# Patient Record
Sex: Female | Born: 1980
Health system: Southern US, Community
[De-identification: ages and names within clinical notes are randomized; demographics above are authoritative.]

## PROBLEM LIST (undated history)

## (undated) ENCOUNTER — Emergency Department (HOSPITAL_COMMUNITY): Admission: EM | Payer: Self-pay

## (undated) DIAGNOSIS — IMO0002 Reserved for concepts with insufficient information to code with codable children: Secondary | ICD-10-CM

## (undated) DIAGNOSIS — R112 Nausea with vomiting, unspecified: Secondary | ICD-10-CM

## (undated) DIAGNOSIS — E785 Hyperlipidemia, unspecified: Secondary | ICD-10-CM

## (undated) DIAGNOSIS — K219 Gastro-esophageal reflux disease without esophagitis: Secondary | ICD-10-CM

## (undated) DIAGNOSIS — U071 COVID-19: Secondary | ICD-10-CM

## (undated) DIAGNOSIS — R519 Headache, unspecified: Secondary | ICD-10-CM

## (undated) DIAGNOSIS — Z9889 Other specified postprocedural states: Secondary | ICD-10-CM

## (undated) DIAGNOSIS — F909 Attention-deficit hyperactivity disorder, unspecified type: Secondary | ICD-10-CM

## (undated) DIAGNOSIS — I10 Essential (primary) hypertension: Secondary | ICD-10-CM

## (undated) DIAGNOSIS — G8929 Other chronic pain: Secondary | ICD-10-CM

## (undated) DIAGNOSIS — I959 Hypotension, unspecified: Secondary | ICD-10-CM

## (undated) DIAGNOSIS — E038 Other specified hypothyroidism: Secondary | ICD-10-CM

## (undated) DIAGNOSIS — I2699 Other pulmonary embolism without acute cor pulmonale: Secondary | ICD-10-CM

## (undated) DIAGNOSIS — E041 Nontoxic single thyroid nodule: Secondary | ICD-10-CM

## (undated) DIAGNOSIS — F32A Depression, unspecified: Secondary | ICD-10-CM

## (undated) DIAGNOSIS — R51 Headache: Secondary | ICD-10-CM

## (undated) DIAGNOSIS — M1990 Unspecified osteoarthritis, unspecified site: Secondary | ICD-10-CM

## (undated) DIAGNOSIS — D6852 Prothrombin gene mutation: Secondary | ICD-10-CM

## (undated) DIAGNOSIS — N809 Endometriosis, unspecified: Secondary | ICD-10-CM

## (undated) DIAGNOSIS — M329 Systemic lupus erythematosus, unspecified: Secondary | ICD-10-CM

## (undated) DIAGNOSIS — F419 Anxiety disorder, unspecified: Secondary | ICD-10-CM

## (undated) HISTORY — DX: Hyperlipidemia, unspecified: E78.5

## (undated) HISTORY — PX: OTHER SURGICAL HISTORY: SHX169

## (undated) HISTORY — DX: Other specified hypothyroidism: E03.8

## (undated) HISTORY — DX: Headache: R51

## (undated) HISTORY — DX: Headache, unspecified: R51.9

## (undated) HISTORY — DX: Endometriosis, unspecified: N80.9

## (undated) HISTORY — DX: Depression, unspecified: F32.A

## (undated) HISTORY — DX: Reserved for concepts with insufficient information to code with codable children: IMO0002

## (undated) HISTORY — DX: Systemic lupus erythematosus, unspecified: M32.9

## (undated) HISTORY — DX: Prothrombin gene mutation: D68.52

## (undated) HISTORY — DX: Nontoxic single thyroid nodule: E04.1

## (undated) HISTORY — DX: Other chronic pain: G89.29

## (undated) HISTORY — DX: Unspecified osteoarthritis, unspecified site: M19.90

---
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
## 1999-01-18 ENCOUNTER — Inpatient Hospital Stay (HOSPITAL_COMMUNITY): Admission: AD | Admit: 1999-01-18 | Discharge: 1999-01-18 | Payer: Self-pay | Admitting: Obstetrics

## 1999-01-19 ENCOUNTER — Inpatient Hospital Stay (HOSPITAL_COMMUNITY): Admission: RE | Admit: 1999-01-19 | Discharge: 1999-01-19 | Payer: Self-pay | Admitting: Obstetrics

## 1999-01-30 ENCOUNTER — Emergency Department (HOSPITAL_COMMUNITY): Admission: EM | Admit: 1999-01-30 | Discharge: 1999-01-30 | Payer: Self-pay | Admitting: Emergency Medicine

## 1999-01-30 ENCOUNTER — Encounter: Payer: Self-pay | Admitting: Emergency Medicine

## 2002-06-07 ENCOUNTER — Inpatient Hospital Stay (HOSPITAL_COMMUNITY): Admission: AD | Admit: 2002-06-07 | Discharge: 2002-06-07 | Payer: Self-pay | Admitting: Family Medicine

## 2003-04-20 HISTORY — PX: DIAGNOSTIC LAPAROSCOPY: SUR761

## 2003-09-06 ENCOUNTER — Ambulatory Visit (HOSPITAL_COMMUNITY): Admission: RE | Admit: 2003-09-06 | Discharge: 2003-09-06 | Payer: Self-pay | Admitting: Gastroenterology

## 2003-09-09 ENCOUNTER — Ambulatory Visit (HOSPITAL_COMMUNITY): Admission: RE | Admit: 2003-09-09 | Discharge: 2003-09-09 | Payer: Self-pay | Admitting: Gastroenterology

## 2003-10-17 ENCOUNTER — Other Ambulatory Visit: Admission: RE | Admit: 2003-10-17 | Discharge: 2003-10-17 | Payer: Self-pay | Admitting: Family Medicine

## 2003-12-11 ENCOUNTER — Ambulatory Visit (HOSPITAL_COMMUNITY): Admission: RE | Admit: 2003-12-11 | Discharge: 2003-12-11 | Payer: Self-pay | Admitting: Family Medicine

## 2004-01-18 ENCOUNTER — Emergency Department (HOSPITAL_COMMUNITY): Admission: EM | Admit: 2004-01-18 | Discharge: 2004-01-18 | Payer: Self-pay | Admitting: Emergency Medicine

## 2004-01-21 ENCOUNTER — Encounter: Admission: RE | Admit: 2004-01-21 | Discharge: 2004-01-21 | Payer: Self-pay | Admitting: *Deleted

## 2004-04-08 ENCOUNTER — Ambulatory Visit (HOSPITAL_COMMUNITY): Admission: RE | Admit: 2004-04-08 | Discharge: 2004-04-08 | Payer: Self-pay | Admitting: Obstetrics and Gynecology

## 2004-08-03 ENCOUNTER — Other Ambulatory Visit: Admission: RE | Admit: 2004-08-03 | Discharge: 2004-08-03 | Payer: Self-pay | Admitting: Obstetrics and Gynecology

## 2004-09-17 ENCOUNTER — Ambulatory Visit (HOSPITAL_COMMUNITY): Admission: RE | Admit: 2004-09-17 | Discharge: 2004-09-17 | Payer: Self-pay | Admitting: Obstetrics and Gynecology

## 2005-04-19 HISTORY — PX: CYSTOSCOPY: SUR368

## 2005-04-19 HISTORY — PX: ABDOMINAL HYSTERECTOMY: SHX81

## 2005-08-03 ENCOUNTER — Ambulatory Visit (HOSPITAL_BASED_OUTPATIENT_CLINIC_OR_DEPARTMENT_OTHER): Admission: RE | Admit: 2005-08-03 | Discharge: 2005-08-03 | Payer: Self-pay | Admitting: Urology

## 2005-09-27 ENCOUNTER — Ambulatory Visit (HOSPITAL_COMMUNITY): Admission: RE | Admit: 2005-09-27 | Discharge: 2005-09-28 | Payer: Self-pay | Admitting: Obstetrics and Gynecology

## 2008-04-19 HISTORY — PX: ROTATOR CUFF REPAIR: SHX139

## 2010-05-09 ENCOUNTER — Encounter: Payer: Self-pay | Admitting: Neurological Surgery

## 2010-05-09 ENCOUNTER — Encounter: Payer: Self-pay | Admitting: Family Medicine

## 2011-04-02 ENCOUNTER — Encounter (HOSPITAL_COMMUNITY): Payer: Self-pay | Admitting: Pharmacist

## 2011-04-05 ENCOUNTER — Encounter (HOSPITAL_COMMUNITY)
Admission: RE | Admit: 2011-04-05 | Discharge: 2011-04-05 | Disposition: A | Discharge status: Home / Self Care | Payer: PRIVATE HEALTH INSURANCE | Source: Ambulatory Visit | Attending: Obstetrics and Gynecology | Admitting: Obstetrics and Gynecology

## 2011-04-05 ENCOUNTER — Encounter (HOSPITAL_COMMUNITY): Payer: Self-pay

## 2011-04-05 HISTORY — DX: Hypotension, unspecified: I95.9

## 2011-04-05 HISTORY — DX: Anxiety disorder, unspecified: F41.9

## 2011-04-05 HISTORY — DX: Gastro-esophageal reflux disease without esophagitis: K21.9

## 2011-04-05 LAB — SURGICAL PCR SCREEN
MRSA, PCR: NEGATIVE
Staphylococcus aureus: NEGATIVE

## 2011-04-05 LAB — CBC
HCT: 37 % (ref 36.0–46.0)
Hemoglobin: 11.9 g/dL — ABNORMAL LOW (ref 12.0–15.0)
MCH: 28.5 pg (ref 26.0–34.0)
MCHC: 32.2 g/dL (ref 30.0–36.0)
MCV: 88.7 fL (ref 78.0–100.0)
Platelets: 222 10*3/uL (ref 150–400)
RBC: 4.17 MIL/uL (ref 3.87–5.11)
RDW: 13.2 % (ref 11.5–15.5)
WBC: 5.7 10*3/uL (ref 4.0–10.5)

## 2011-04-05 LAB — BASIC METABOLIC PANEL
BUN: 11 mg/dL (ref 6–23)
CO2: 25 mEq/L (ref 19–32)
Calcium: 9.2 mg/dL (ref 8.4–10.5)
Chloride: 104 mEq/L (ref 96–112)
Creatinine, Ser: 0.69 mg/dL (ref 0.50–1.10)
GFR calc Af Amer: >90 mL/min (ref >90)
GFR calc non Af Amer: >90 mL/min (ref >90)
Glucose, Bld: 70 mg/dL (ref 70–99)
Potassium: 3.7 mEq/L (ref 3.5–5.1)
Sodium: 138 mEq/L (ref 135–145)

## 2011-04-05 NOTE — Patient Instructions (Signed)
   Your procedure is scheduled on: Friday December 28th  Enter through the Main Entrance of [facility] at: [employer] up the phone at the desk and dial [phone] and inform us of your arrival.  Please call this number if you have any problems the morning of surgery: [phone]  Remember: Do not eat food after midnight:Thursday Do not drink clear liquids after:midnight thursday Take these medicines the morning of surgery with a SIP OF WATER:none  Do not wear jewelry, make-up, or FINGER nail polish Do not wear lotions, powders, perfumes or deodorant. Do not shave 48 hours prior to surgery. Do not bring valuables to the hospital.  Leave suitcase in the car. After Surgery it may be brought to your room. For patients being admitted to the hospital, checkout time is 11:00am the day of discharge.  Patients discharged on the day of surgery will not be allowed to drive home.     Remember to use your hibiclens as instructed.Please shower with 1/2 bottle the evening before your surgery and the other 1/2 bottle the morning of surgery.

## 2011-04-08 ENCOUNTER — Other Ambulatory Visit: Payer: Self-pay | Admitting: Obstetrics and Gynecology

## 2011-04-15 NOTE — H&P (Signed)
Kim Pham, Kim Pham              ACCOUNT NO.:  [id number]  MEDICAL RECORD NO.:  [id number]  LOCATION:                                 FACILITY:  PHYSICIAN:  Lenoard Aden, M.D.DATE OF BIRTH:  07-Feb-1981  DATE OF ADMISSION: DATE OF DISCHARGE:                             HISTORY & PHYSICAL   CHIEF COMPLAINT:  Right lower quadrant pain.  HISTORY OF PRESENT ILLNESS:  She is a 30 year old white female, G3, P1, status post LAVH in 2007 with a history of endometriosis now with right lower quadrant pain.  She has a strong family history of ovarian cancer, with known ovarian cyst, and now proceeded for definitive therapy.  She has __________, history of 1 vaginal delivery, two spontaneous abortions, history of laparoscopic surgery for endometriosis and an LAVH in 2007.  FAMILY HISTORY:  Noted mother and maternal grandmother with ovarian cancer, heart disease, and chronic hypertension.  PHYSICAL EXAMINATION:  GENERAL:  She is a well-developed, well- nourished, white female.  Weight of 173 pounds, 68 inches tall, blood pressure 116/70. HEENT:  Normal. NECK:  Supple.  Full range of motion. ABDOMEN:  Soft, nontender.  PELVIC:  A well supported vagina, right adnexal tenderness, and fullness. EXTREMITIES:  No cords. NEUROLOGICAL:  Nonfocal. SKIN:  Intact.  IMPRESSION: 1. Symptomatic right ovarian cyst with persistent chronic pelvic pain. 2. Strong family history of ovarian cancer. 3. Status post LAVH.  PLAN:  Engineer, building services assisted right salpingo-oophorectomy, possible lysis of adhesions, possible ablation of endometriosis, a left salpingectomy risks of anesthesia, infection, bleeding, injury to abdominal organs, need for repair was discussed, delayed versus immediate complications include bowel, bladder injury noted, inability to cure all of pelvic pain discussed with the patient, acknowledges, will proceed.     Lenoard Aden, M.D.    RJT/MEDQ  D:  04/15/2011  T:   04/15/2011  Job:  [id number]

## 2011-04-16 ENCOUNTER — Encounter (HOSPITAL_COMMUNITY): Payer: Self-pay | Admitting: Anesthesiology

## 2011-04-16 ENCOUNTER — Ambulatory Visit (HOSPITAL_COMMUNITY): Payer: PRIVATE HEALTH INSURANCE | Admitting: Anesthesiology

## 2011-04-16 ENCOUNTER — Other Ambulatory Visit: Payer: Self-pay | Admitting: Obstetrics and Gynecology

## 2011-04-16 ENCOUNTER — Encounter (HOSPITAL_COMMUNITY)
Admission: RE | Disposition: A | Discharge status: Home / Self Care | Payer: Self-pay | Source: Ambulatory Visit | Attending: Obstetrics and Gynecology

## 2011-04-16 ENCOUNTER — Ambulatory Visit (HOSPITAL_COMMUNITY)
Admission: RE | Admit: 2011-04-16 | Discharge: 2011-04-16 | Disposition: A | Discharge status: Home / Self Care | Payer: PRIVATE HEALTH INSURANCE | Source: Ambulatory Visit | Attending: Obstetrics and Gynecology | Admitting: Obstetrics and Gynecology

## 2011-04-16 ENCOUNTER — Encounter (HOSPITAL_COMMUNITY): Payer: Self-pay | Admitting: *Deleted

## 2011-04-16 DIAGNOSIS — Z01812 Encounter for preprocedural laboratory examination: Secondary | ICD-10-CM | POA: Insufficient documentation

## 2011-04-16 DIAGNOSIS — Z01818 Encounter for other preprocedural examination: Secondary | ICD-10-CM | POA: Insufficient documentation

## 2011-04-16 DIAGNOSIS — N83209 Unspecified ovarian cyst, unspecified side: Secondary | ICD-10-CM | POA: Insufficient documentation

## 2011-04-16 DIAGNOSIS — N83201 Unspecified ovarian cyst, right side: Secondary | ICD-10-CM

## 2011-04-16 DIAGNOSIS — N803 Endometriosis of pelvic peritoneum, unspecified: Secondary | ICD-10-CM | POA: Insufficient documentation

## 2011-04-16 DIAGNOSIS — Z8041 Family history of malignant neoplasm of ovary: Secondary | ICD-10-CM | POA: Insufficient documentation

## 2011-04-16 DIAGNOSIS — R1031 Right lower quadrant pain: Secondary | ICD-10-CM | POA: Insufficient documentation

## 2011-04-16 HISTORY — PX: SALPINGOOPHORECTOMY: SHX82

## 2011-04-16 SURGERY — ROBOTIC ASSISTED LAPAROSCOPIC LYSIS OF ADHESION
Anesthesia: General | Site: Abdomen | Laterality: Right | Wound class: Clean Contaminated

## 2011-04-16 MED ORDER — NEOSTIGMINE METHYLSULFATE 1 MG/ML IJ SOLN
INTRAMUSCULAR | Status: AC
  Filled 2011-04-16: qty 10

## 2011-04-16 MED ORDER — ROCURONIUM BROMIDE 50 MG/5ML IV SOLN
INTRAVENOUS | Status: AC
  Filled 2011-04-16: qty 2

## 2011-04-16 MED ORDER — OXYCODONE-ACETAMINOPHEN 5-325 MG PO TABS
1.0000 | ORAL_TABLET | ORAL | Status: DC | PRN
  Administered 2011-04-16: 1 via ORAL

## 2011-04-16 MED ORDER — METOCLOPRAMIDE HCL 5 MG/ML IJ SOLN
INTRAMUSCULAR | Status: AC
  Administered 2011-04-16: 10 mg via INTRAVENOUS
  Filled 2011-04-16: qty 2

## 2011-04-16 MED ORDER — DEXAMETHASONE SODIUM PHOSPHATE 10 MG/ML IJ SOLN
INTRAMUSCULAR | Status: DC | PRN
  Administered 2011-04-16: 10 mg via INTRAVENOUS

## 2011-04-16 MED ORDER — METOCLOPRAMIDE HCL 5 MG/ML IJ SOLN
10.0000 mg | Freq: Once | INTRAMUSCULAR | Status: AC | PRN
  Administered 2011-04-16: 10 mg via INTRAVENOUS

## 2011-04-16 MED ORDER — MIDAZOLAM HCL 5 MG/5ML IJ SOLN
INTRAMUSCULAR | Status: DC | PRN
  Administered 2011-04-16: 2 mg via INTRAVENOUS

## 2011-04-16 MED ORDER — OXYCODONE-ACETAMINOPHEN 5-325 MG PO TABS: 1.0000 | ORAL_TABLET | ORAL | Status: AC | PRN

## 2011-04-16 MED ORDER — FENTANYL CITRATE 0.05 MG/ML IJ SOLN
INTRAMUSCULAR | Status: AC
  Filled 2011-04-16: qty 5

## 2011-04-16 MED ORDER — OXYCODONE-ACETAMINOPHEN 5-325 MG PO TABS
ORAL_TABLET | ORAL | Status: AC
  Filled 2011-04-16: qty 1

## 2011-04-16 MED ORDER — PROPOFOL 10 MG/ML IV EMUL
INTRAVENOUS | Status: AC
  Filled 2011-04-16: qty 20

## 2011-04-16 MED ORDER — SCOPOLAMINE 1 MG/3DAYS TD PT72
1.0000 | MEDICATED_PATCH | Freq: Once | TRANSDERMAL | Status: DC
  Administered 2011-04-16: 1.5 mg via TRANSDERMAL

## 2011-04-16 MED ORDER — PROPOFOL 10 MG/ML IV EMUL
INTRAVENOUS | Status: DC | PRN
  Administered 2011-04-16: 200 mg via INTRAVENOUS

## 2011-04-16 MED ORDER — BUPIVACAINE HCL (PF) 0.25 % IJ SOLN
INTRAMUSCULAR | Status: DC | PRN
  Administered 2011-04-16: 12 mL

## 2011-04-16 MED ORDER — MIDAZOLAM HCL 2 MG/2ML IJ SOLN
INTRAMUSCULAR | Status: AC
  Filled 2011-04-16: qty 2

## 2011-04-16 MED ORDER — LACTATED RINGERS IV SOLN
INTRAVENOUS | Status: DC
  Administered 2011-04-16 (×2): via INTRAVENOUS

## 2011-04-16 MED ORDER — GLYCOPYRROLATE 0.2 MG/ML IJ SOLN
INTRAMUSCULAR | Status: DC | PRN
  Administered 2011-04-16: .6 mg via INTRAVENOUS
  Administered 2011-04-16: 0.1 mg via INTRAVENOUS

## 2011-04-16 MED ORDER — HYDROMORPHONE HCL PF 1 MG/ML IJ SOLN
INTRAMUSCULAR | Status: DC | PRN
  Administered 2011-04-16 (×2): 1 mg via INTRAVENOUS

## 2011-04-16 MED ORDER — LIDOCAINE HCL (CARDIAC) 20 MG/ML IV SOLN
INTRAVENOUS | Status: DC | PRN
  Administered 2011-04-16: 80 mg via INTRAVENOUS

## 2011-04-16 MED ORDER — CEFAZOLIN SODIUM 1-5 GM-% IV SOLN
1.0000 g | INTRAVENOUS | Status: AC
  Administered 2011-04-16: 1 g via INTRAVENOUS

## 2011-04-16 MED ORDER — ONDANSETRON HCL 4 MG/2ML IJ SOLN
INTRAMUSCULAR | Status: AC
  Filled 2011-04-16: qty 2

## 2011-04-16 MED ORDER — FENTANYL CITRATE 0.05 MG/ML IJ SOLN
INTRAMUSCULAR | Status: DC | PRN
  Administered 2011-04-16 (×2): 100 ug via INTRAVENOUS
  Administered 2011-04-16: 50 ug via INTRAVENOUS

## 2011-04-16 MED ORDER — ONDANSETRON HCL 4 MG/2ML IJ SOLN
INTRAMUSCULAR | Status: DC | PRN
  Administered 2011-04-16: 4 mg via INTRAVENOUS

## 2011-04-16 MED ORDER — ROCURONIUM BROMIDE 100 MG/10ML IV SOLN
INTRAVENOUS | Status: DC | PRN
  Administered 2011-04-16: 5 mg via INTRAVENOUS
  Administered 2011-04-16: 10 mg via INTRAVENOUS
  Administered 2011-04-16: 35 mg via INTRAVENOUS

## 2011-04-16 MED ORDER — PANTOPRAZOLE SODIUM 40 MG PO TBEC
DELAYED_RELEASE_TABLET | ORAL | Status: AC
  Filled 2011-04-16: qty 1

## 2011-04-16 MED ORDER — GLYCOPYRROLATE 0.2 MG/ML IJ SOLN
INTRAMUSCULAR | Status: AC
  Filled 2011-04-16: qty 3

## 2011-04-16 MED ORDER — HYDROMORPHONE HCL PF 1 MG/ML IJ SOLN
INTRAMUSCULAR | Status: AC
  Filled 2011-04-16: qty 1

## 2011-04-16 MED ORDER — MEPERIDINE HCL 25 MG/ML IJ SOLN: 6.2500 mg | INTRAMUSCULAR | Status: DC | PRN

## 2011-04-16 MED ORDER — PANTOPRAZOLE SODIUM 40 MG PO TBEC
40.0000 mg | DELAYED_RELEASE_TABLET | Freq: Once | ORAL | Status: AC
  Administered 2011-04-16: 40 mg via ORAL

## 2011-04-16 MED ORDER — NEOSTIGMINE METHYLSULFATE 1 MG/ML IJ SOLN
INTRAMUSCULAR | Status: DC | PRN
  Administered 2011-04-16: 3 mg via INTRAVENOUS

## 2011-04-16 MED ORDER — FENTANYL CITRATE 0.05 MG/ML IJ SOLN
INTRAMUSCULAR | Status: AC
  Administered 2011-04-16: 25 ug via INTRAVENOUS
  Filled 2011-04-16: qty 2

## 2011-04-16 MED ORDER — DEXAMETHASONE SODIUM PHOSPHATE 10 MG/ML IJ SOLN
INTRAMUSCULAR | Status: AC
  Filled 2011-04-16: qty 1

## 2011-04-16 MED ORDER — LIDOCAINE HCL (CARDIAC) 20 MG/ML IV SOLN
INTRAVENOUS | Status: AC
  Filled 2011-04-16: qty 5

## 2011-04-16 MED ORDER — SCOPOLAMINE 1 MG/3DAYS TD PT72
MEDICATED_PATCH | TRANSDERMAL | Status: AC
  Filled 2011-04-16: qty 1

## 2011-04-16 MED ORDER — FENTANYL CITRATE 0.05 MG/ML IJ SOLN
25.0000 ug | INTRAMUSCULAR | Status: DC | PRN
  Administered 2011-04-16 (×4): 25 ug via INTRAVENOUS

## 2011-04-16 SURGICAL SUPPLY — 50 items
ADH SKN CLS APL DERMABOND .7 (GAUZE/BANDAGES/DRESSINGS) ×3
BAG SPEC RTRVL LRG 6X4 10 (ENDOMECHANICALS) ×3
BAG URINE DRAINAGE (UROLOGICAL SUPPLIES) ×4 IMPLANT
BARRIER ADHS 3X4 INTERCEED (GAUZE/BANDAGES/DRESSINGS) ×3 IMPLANT
BRR ADH 4X3 ABS CNTRL BYND (GAUZE/BANDAGES/DRESSINGS)
CABLE HIGH FREQUENCY MONO STRZ (ELECTRODE) ×4 IMPLANT
CATH FOLEY 2WAY SLVR  5CC 16FR (CATHETERS) ×1
CATH FOLEY 2WAY SLVR 5CC 16FR (CATHETERS) IMPLANT
CHLORAPREP W/TINT 26ML (MISCELLANEOUS) ×4 IMPLANT
CLOTH BEACON ORANGE TIMEOUT ST (SAFETY) ×4 IMPLANT
CONT PATH 16OZ SNAP LID 3702 (MISCELLANEOUS) ×4 IMPLANT
COVER MAYO STAND STRL (DRAPES) ×4 IMPLANT
COVER TABLE BACK 60X90 (DRAPES) ×8 IMPLANT
COVER TIP SHEARS 8 DVNC (MISCELLANEOUS) ×3 IMPLANT
COVER TIP SHEARS 8MM DA VINCI (MISCELLANEOUS) ×1
DECANTER SPIKE VIAL GLASS SM (MISCELLANEOUS) ×4 IMPLANT
DERMABOND ADVANCED (GAUZE/BANDAGES/DRESSINGS) ×1
DERMABOND ADVANCED .7 DNX12 (GAUZE/BANDAGES/DRESSINGS) ×3 IMPLANT
DRAPE HUG U DISPOSABLE (DRAPE) ×4 IMPLANT
DRAPE LG THREE QUARTER DISP (DRAPES) ×8 IMPLANT
DRAPE WARM FLUID 44X44 (DRAPE) ×4 IMPLANT
ELECT REM PT RETURN 9FT ADLT (ELECTROSURGICAL) ×4
ELECTRODE REM PT RTRN 9FT ADLT (ELECTROSURGICAL) ×3 IMPLANT
EVACUATOR SMOKE 8.L (FILTER) ×4 IMPLANT
GLOVE BIO SURGEON STRL SZ7.5 (GLOVE) ×12 IMPLANT
GOWN PREVENTION PLUS LG XLONG (DISPOSABLE) ×12 IMPLANT
GOWN PREVENTION PLUS XLARGE (GOWN DISPOSABLE) ×6 IMPLANT
KIT ACCESSORY DA VINCI DISP (KITS) ×1
KIT ACCESSORY DVNC DISP (KITS) ×3 IMPLANT
NDL INSUFFLATION 14GA 120MM (NEEDLE) ×3 IMPLANT
NEEDLE INSUFFLATION 14GA 120MM (NEEDLE) ×4 IMPLANT
PACK LAVH (CUSTOM PROCEDURE TRAY) ×4 IMPLANT
PAD PREP 24X48 CUFFED NSTRL (MISCELLANEOUS) ×8 IMPLANT
PLUG CATH AND CAP STER (CATHETERS) ×4 IMPLANT
POSITIONER SURGICAL ARM (MISCELLANEOUS) ×8 IMPLANT
POUCH SPECIMEN RETRIEVAL 10MM (ENDOMECHANICALS) ×1 IMPLANT
SET IRRIG TUBING LAPAROSCOPIC (IRRIGATION / IRRIGATOR) ×4 IMPLANT
SOLUTION ELECTROLUBE (MISCELLANEOUS) ×4 IMPLANT
SUT VICRYL 0 UR6 27IN ABS (SUTURE) ×4 IMPLANT
SUT VICRYL RAPIDE 4/0 PS 2 (SUTURE) ×8 IMPLANT
SYR 50ML LL SCALE MARK (SYRINGE) ×4 IMPLANT
SYRINGE 10CC LL (SYRINGE) ×4 IMPLANT
TOWEL OR 17X24 6PK STRL BLUE (TOWEL DISPOSABLE) ×12 IMPLANT
TROCAR DISP BLADELESS 8 DVNC (TROCAR) ×3 IMPLANT
TROCAR DISP BLADELESS 8MM (TROCAR) ×1
TROCAR XCEL NON-BLD 5MMX100MML (ENDOMECHANICALS) ×4 IMPLANT
TROCAR Z-THREAD 12X150 (TROCAR) ×4 IMPLANT
TUBING FILTER THERMOFLATOR (ELECTROSURGICAL) ×4 IMPLANT
WARMER LAPAROSCOPE (MISCELLANEOUS) ×4 IMPLANT
WATER STERILE IRR 1000ML POUR (IV SOLUTION) ×12 IMPLANT

## 2011-04-16 NOTE — Progress Notes (Signed)
Patient ID: Kim Pham, female   DOB: Apr 26, 1980, 30 y.o.   MRN: [id number] Consent done. Pt examined. No changes noted. Update done.

## 2011-04-16 NOTE — Anesthesia Procedure Notes (Signed)
Procedure Name: Intubation Date/Time: 04/16/2011 7:41 AM Performed by: Karleen Dolphin Pre-anesthesia Checklist: Patient identified, Patient being monitored, Timeout performed, Emergency Drugs available and Suction available Patient Re-evaluated:Patient Re-evaluated prior to inductionOxygen Delivery Method: Circle System Utilized Preoxygenation: Pre-oxygenation with 100% oxygen Intubation Type: IV induction Ventilation: Mask ventilation without difficulty Laryngoscope Size: Mac and 3 Grade View: Grade I Tube type: Oral Tube size: 7.0 mm Number of attempts: 1 Airway Equipment and Method: stylet Placement Confirmation: ETT inserted through vocal cords under direct vision,  positive ETCO2 and breath sounds checked- equal and bilateral Secured at: 21 cm Tube secured with: Tape Dental Injury: Teeth and Oropharynx as per pre-operative assessment

## 2011-04-16 NOTE — Anesthesia Postprocedure Evaluation (Signed)
Anesthesia Post Note  Patient: Kim Pham  Procedure(s) Performed:  ROBOTIC ASSISTED LAPAROSCOPIC LYSIS OF ADHESION; SALPINGO OOPHERECTOMY; UNILATERAL SALPINGECTOMY  Anesthesia type: GA  Patient location: PACU  Post pain: Pain level controlled  Post assessment: Post-op Vital signs reviewed  Last Vitals:  Filed Vitals:   04/16/11 0945  BP: 100/61  Pulse: 81  Temp:   Resp: 16    Post vital signs: Reviewed  Level of consciousness: sedated  Complications: No apparent anesthesia complications

## 2011-04-16 NOTE — Anesthesia Preprocedure Evaluation (Signed)
Anesthesia Evaluation  Patient identified by MRN, date of birth, ID band Patient awake    Reviewed: Allergy & Precautions, H&P , NPO status , Patient's Chart, lab work & pertinent test results  Airway Mallampati: II TM Distance: >3 FB Neck ROM: Full    Dental No notable dental hx. (+) Teeth Intact   Pulmonary neg pulmonary ROS,  clear to auscultation  Pulmonary exam normal       Cardiovascular neg cardio ROS Regular Normal    Neuro/Psych Anxiety Negative Neurological ROS  Negative Psych ROS   GI/Hepatic negative GI ROS, Neg liver ROS, GERD-  Controlled and Medicated,  Endo/Other  Negative Endocrine ROS  Renal/GU negative Renal ROS  Genitourinary negative   Musculoskeletal negative musculoskeletal ROS (+)   Abdominal Normal abdominal exam  (+)   Peds  Hematology negative hematology ROS (+)   Anesthesia Other Findings   Reproductive/Obstetrics negative OB ROS                           Anesthesia Physical Anesthesia Plan  ASA: II  Anesthesia Plan: General   Post-op Pain Management:    Induction: Intravenous  Airway Management Planned: Oral ETT  Additional Equipment:   Intra-op Plan:   Post-operative Plan: Extubation in OR  Informed Consent: I have reviewed the patients History and Physical, chart, labs and discussed the procedure including the risks, benefits and alternatives for the proposed anesthesia with the patient or authorized representative who has indicated his/her understanding and acceptance.   Dental advisory given  Plan Discussed with: CRNA, Anesthesiologist and Surgeon  Anesthesia Plan Comments:         Anesthesia Quick Evaluation

## 2011-04-16 NOTE — Op Note (Signed)
04/16/2011  9:01 AM  PATIENT:  Kim Pham  30 y.o. female  PRE-OPERATIVE DIAGNOSIS:  Right Lower Quadrant Pain RIGHT OVARIAN CYST FAMILY HISTORY OF OVARIAN CANCER HISTORY OF ENDOMETRIOSIS  POST-OPERATIVE DIAGNOSIS:  Right Lower Quadrant Pain RIGHT OVARIAN CYST PELVIC ADHESIONS PELVIC ENDOMETRIOSIS  PROCEDURE:  Procedure(s): ROBOTIC ASSISTED LAPAROSCOPIC LYSIS OF ADHESION SALPINGO OOPHERECTOMY(RIGHT) UNILATERAL SALPINGECTOMY(LEFT) EXCISION OF PELVIC ENDOMETRIOSIS  SURGEON:  Surgeon(s): Lenoard Aden, MD  ASSISTANTSRenae Fickle, CNM  ANESTHESIA:   local and general  ESTIMATED BLOOD LOSS: * No blood loss amount entered *   DRAINS: Urinary Catheter (Foley)   LOCAL MEDICATIONS USED:  MARCAINE 10CC  SPECIMEN:  Source of Specimen:  RIGHT OVARY, RIGHT TUBE, OVARIAN CYST, LEFT TUBE, PERITONEAL BIOPSY  DISPOSITION OF SPECIMEN:  PATHOLOGY  COUNTS:  YES  DICTATION #: [id number]  PLAN OF CARE: DC HOME  PATIENT DISPOSITION:  PACU - hemodynamically stable.

## 2011-04-16 NOTE — Transfer of Care (Signed)
Immediate Anesthesia Transfer of Care Note  Patient: Kim Pham  Procedure(s) Performed:  ROBOTIC ASSISTED LAPAROSCOPIC LYSIS OF ADHESION; SALPINGO OOPHERECTOMY; UNILATERAL SALPINGECTOMY  Patient Location: PACU  Anesthesia Type: General  Level of Consciousness: awake, alert  and oriented  Airway & Oxygen Therapy: Patient Spontanous Breathing and Patient connected to nasal cannula oxygen  Post-op Assessment: Report given to PACU RN and Post -op Vital signs reviewed and stable  Post vital signs: Reviewed and stable  Complications: No apparent anesthesia complications

## 2011-04-17 NOTE — Op Note (Signed)
NAME:  Kim Pham, Kim Pham              ACCOUNT NO.:  [id number]  MEDICAL RECORD NO.:  [id number]  LOCATION:  [facility]                          FACILITY:  [facility]  PHYSICIAN:  Lenoard Aden, M.D.DATE OF BIRTH:  1981/02/14  DATE OF PROCEDURE:  04/16/2011 DATE OF DISCHARGE:  04/16/2011                              OPERATIVE REPORT   DESCRIPTION OF PROCEDURE:  After being apprised of risks of anesthesia, infection, bleeding, injury to abdominal organs, possible need for repair, delayed versus immediate complications to include bowel, bladder injury, possible need for repair, inability to cure all pelvic pain, the patient was brought to the operating room.  She was administered a general anesthetic without complications.  Prepped and draped in usual sterile fashion.  Feet were placed in Yellofin stirrups.  Foley catheter placed in the standard fashion, and a bulb syringe was also placed vaginally for distention of the vagina post hysterectomy.  At this time, attention was turned to the abdominal portion of the procedure, whereby an infraumbilical incision was made with a scalpel after dilute Marcaine solution was placed.  The Veress needle was placed, opening pressure of 2 is noted, 3-1/2 to 4 L of CO2 insufflated without difficulty.  Trocar placed atraumatically and visualization reveals a normal liver gallbladder area and normal appendiceal area.  There are extensive adhesions of the sigmoid, bowel and bowel, mesentery to the vaginal cuff and left adnexa.  The left robotic 8-mm port was placed, the right robotic was placed 8 mm and a 5-mm assistant port on the left was placed.  The robot was then docked in a standard fashion after achieving deep Trendelenburg position.  At this time, the PK forceps and scissors were entered respectively and attention was turned to the robotic portion of the procedure whereby these bowel adhesions are extensively lysed from the vaginal cuff, the left pelvic  fossa and left pelvic brim freeing the bowel in the process and revealing exposure of a normal- appearing left ovary and tube.  There is also a cyst on the right ovary and there is diffuse vesicular peritoneal disease, specifically on the right side.  The right infundibulopelvic ligament was identified, the right ureter was identified.  The right infundibulopelvic ligament was cauterized using bipolar cautery and divided down, then progressively placed down the mesosalpingeal area were done down to the level of the bladder where the specimen was excised and placed in the cul-de-sac.  On the left side, the mesosalpinx was identified, cauterized using bipolar cautery, divided and the left tube was separated in a similar fashion leaving the left ovary and left infundibulopelvic ligament intact.  Left ureter also appearing normal.  The left tube was completely excised and also placed on the cul-de-sac.  This peritoneal disease which has caused questionable scarring on the right is then elevated away from the ureter and dissected sharply and excised, and sent to pathology for confirmation of probable endometriosis.  At this time, good hemostasis was assured in the areas of previous dissection.  The robot was then undocked and all instruments were removed from the abdomen.  The 8-mm scope was placed.  The left tube and peritoneal biopsy are removed directly through the ports, then  the right tube and ovary were placed into an EndoCatch, elevated through the camera port and removed in a standard fashion.  Good hemostasis was noted.  Irrigation was accomplished.  All trocars were then removed under direct visualization.  CO2 was released.  Incisions were closed using 0 Vicryl 4-0 Vicryl, and Dermabond.  The patient tolerated the procedure well.  Bulb syringe was removed from the vagina. Foley catheter was removed.  The patient was sent to recovery in good condition.     Lenoard Aden,  M.D.     RJT/MEDQ  D:  04/16/2011  T:  04/17/2011  Job:  [id number]

## 2011-04-19 ENCOUNTER — Encounter (HOSPITAL_COMMUNITY): Payer: Self-pay | Admitting: Obstetrics and Gynecology

## 2014-11-21 ENCOUNTER — Other Ambulatory Visit (INDEPENDENT_AMBULATORY_CARE_PROVIDER_SITE_OTHER): Payer: PRIVATE HEALTH INSURANCE

## 2014-11-21 ENCOUNTER — Other Ambulatory Visit: Payer: Self-pay | Admitting: Pulmonary Disease

## 2014-11-21 DIAGNOSIS — D649 Anemia, unspecified: Secondary | ICD-10-CM

## 2014-11-21 DIAGNOSIS — F419 Anxiety disorder, unspecified: Secondary | ICD-10-CM | POA: Diagnosis not present

## 2014-11-21 DIAGNOSIS — E538 Deficiency of other specified B group vitamins: Secondary | ICD-10-CM

## 2014-11-21 DIAGNOSIS — E559 Vitamin D deficiency, unspecified: Secondary | ICD-10-CM

## 2014-11-21 LAB — FERRITIN: Ferritin: 25.5 ng/mL (ref 10.0–291.0)

## 2014-11-21 LAB — CBC WITH DIFFERENTIAL/PLATELET
Basophils Absolute: 0 10*3/uL (ref 0.0–0.1)
Basophils Relative: 0.6 % (ref 0.0–3.0)
Eosinophils Absolute: 0.1 10*3/uL (ref 0.0–0.7)
Eosinophils Relative: 1.1 % (ref 0.0–5.0)
HCT: 37.7 % (ref 36.0–46.0)
Hemoglobin: 12.7 g/dL (ref 12.0–15.0)
Lymphocytes Relative: 30.3 % (ref 12.0–46.0)
Lymphs Abs: 1.9 10*3/uL (ref 0.7–4.0)
MCHC: 33.8 g/dL (ref 30.0–36.0)
MCV: 86.5 fl (ref 78.0–100.0)
Monocytes Absolute: 0.4 10*3/uL (ref 0.1–1.0)
Monocytes Relative: 5.7 % (ref 3.0–12.0)
Neutro Abs: 4 10*3/uL (ref 1.4–7.7)
Neutrophils Relative %: 62.3 % (ref 43.0–77.0)
Platelets: 254 10*3/uL (ref 150.0–400.0)
RBC: 4.35 Mil/uL (ref 3.87–5.11)
RDW: 13.7 % (ref 11.5–15.5)
WBC: 6.4 10*3/uL (ref 4.0–10.5)

## 2014-11-21 LAB — IBC PANEL
Iron: 77 ug/dL (ref 42–145)
Saturation Ratios: 19.2 % — ABNORMAL LOW (ref 20.0–50.0)
Transferrin: 286 mg/dL (ref 212.0–360.0)

## 2014-11-21 LAB — BASIC METABOLIC PANEL
BUN: 14 mg/dL (ref 6–23)
CO2: 28 mEq/L (ref 19–32)
Calcium: 9.8 mg/dL (ref 8.4–10.5)
Chloride: 101 mEq/L (ref 96–112)
Creatinine, Ser: 0.73 mg/dL (ref 0.40–1.20)
GFR: 96.65 mL/min (ref >60.00)
Glucose, Bld: 82 mg/dL (ref 70–99)
Potassium: 4.1 mEq/L (ref 3.5–5.1)
Sodium: 137 mEq/L (ref 135–145)

## 2014-11-21 LAB — VITAMIN D 25 HYDROXY (VIT D DEFICIENCY, FRACTURES): VITD: 28.64 ng/mL — ABNORMAL LOW (ref 30.00–100.00)

## 2014-11-21 LAB — TSH: TSH: 0.89 u[IU]/mL (ref 0.35–4.50)

## 2014-11-21 LAB — VITAMIN B12: Vitamin B-12: 360 pg/mL (ref 211–911)

## 2015-01-03 ENCOUNTER — Other Ambulatory Visit (HOSPITAL_COMMUNITY): Payer: Self-pay | Admitting: Orthopaedic Surgery

## 2015-01-03 DIAGNOSIS — M25561 Pain in right knee: Secondary | ICD-10-CM

## 2015-01-04 ENCOUNTER — Ambulatory Visit (HOSPITAL_BASED_OUTPATIENT_CLINIC_OR_DEPARTMENT_OTHER)
Admission: RE | Admit: 2015-01-04 | Discharge: 2015-01-04 | Disposition: A | Discharge status: Home / Self Care | Payer: 59 | Source: Ambulatory Visit | Attending: Orthopaedic Surgery | Admitting: Orthopaedic Surgery

## 2015-01-04 DIAGNOSIS — M25561 Pain in right knee: Secondary | ICD-10-CM | POA: Insufficient documentation

## 2015-01-04 DIAGNOSIS — R531 Weakness: Secondary | ICD-10-CM | POA: Insufficient documentation

## 2015-01-04 DIAGNOSIS — M2241 Chondromalacia patellae, right knee: Secondary | ICD-10-CM | POA: Insufficient documentation

## 2015-01-04 DIAGNOSIS — M76891 Other specified enthesopathies of right lower limb, excluding foot: Secondary | ICD-10-CM | POA: Diagnosis not present

## 2015-01-04 DIAGNOSIS — M7989 Other specified soft tissue disorders: Secondary | ICD-10-CM | POA: Diagnosis not present

## 2015-01-07 ENCOUNTER — Ambulatory Visit (HOSPITAL_COMMUNITY): Payer: PRIVATE HEALTH INSURANCE

## 2015-01-11 ENCOUNTER — Emergency Department (HOSPITAL_COMMUNITY)
Admission: EM | Admit: 2015-01-11 | Discharge: 2015-01-12 | Disposition: A | Discharge status: Home / Self Care | Payer: 59 | Attending: Emergency Medicine | Admitting: Emergency Medicine

## 2015-01-11 ENCOUNTER — Emergency Department (HOSPITAL_COMMUNITY): Payer: 59

## 2015-01-11 ENCOUNTER — Encounter (HOSPITAL_COMMUNITY): Payer: Self-pay | Admitting: *Deleted

## 2015-01-11 ENCOUNTER — Encounter (HOSPITAL_COMMUNITY): Payer: Self-pay | Admitting: Emergency Medicine

## 2015-01-11 ENCOUNTER — Emergency Department (HOSPITAL_COMMUNITY)
Admission: EM | Admit: 2015-01-11 | Discharge: 2015-01-11 | Disposition: A | Discharge status: Home / Self Care | Payer: 59 | Source: Home / Self Care | Attending: Family Medicine | Admitting: Family Medicine

## 2015-01-11 DIAGNOSIS — R1031 Right lower quadrant pain: Secondary | ICD-10-CM | POA: Insufficient documentation

## 2015-01-11 DIAGNOSIS — Z87891 Personal history of nicotine dependence: Secondary | ICD-10-CM | POA: Insufficient documentation

## 2015-01-11 DIAGNOSIS — F419 Anxiety disorder, unspecified: Secondary | ICD-10-CM | POA: Insufficient documentation

## 2015-01-11 DIAGNOSIS — Z79899 Other long term (current) drug therapy: Secondary | ICD-10-CM | POA: Insufficient documentation

## 2015-01-11 DIAGNOSIS — R112 Nausea with vomiting, unspecified: Secondary | ICD-10-CM | POA: Diagnosis not present

## 2015-01-11 DIAGNOSIS — Z90710 Acquired absence of both cervix and uterus: Secondary | ICD-10-CM | POA: Diagnosis not present

## 2015-01-11 DIAGNOSIS — Z8719 Personal history of other diseases of the digestive system: Secondary | ICD-10-CM | POA: Diagnosis not present

## 2015-01-11 LAB — CBC
HCT: 39.1 % (ref 36.0–46.0)
Hemoglobin: 13 g/dL (ref 12.0–15.0)
MCH: 29 pg (ref 26.0–34.0)
MCHC: 33.2 g/dL (ref 30.0–36.0)
MCV: 87.1 fL (ref 78.0–100.0)
Platelets: 254 10*3/uL (ref 150–400)
RBC: 4.49 MIL/uL (ref 3.87–5.11)
RDW: 12.8 % (ref 11.5–15.5)
WBC: 7.5 10*3/uL (ref 4.0–10.5)

## 2015-01-11 LAB — COMPREHENSIVE METABOLIC PANEL
ALT: 20 U/L (ref 14–54)
AST: 20 U/L (ref 15–41)
Albumin: 4.2 g/dL (ref 3.5–5.0)
Alkaline Phosphatase: 49 U/L (ref 38–126)
Anion gap: 9 (ref 5–15)
BUN: 14 mg/dL (ref 6–20)
CO2: 23 mmol/L (ref 22–32)
Calcium: 9.4 mg/dL (ref 8.9–10.3)
Chloride: 105 mmol/L (ref 101–111)
Creatinine, Ser: 0.8 mg/dL (ref 0.44–1.00)
GFR calc Af Amer: >60 mL/min (ref >60)
GFR calc non Af Amer: >60 mL/min (ref >60)
Glucose, Bld: 96 mg/dL (ref 65–99)
Potassium: 3.7 mmol/L (ref 3.5–5.1)
Sodium: 137 mmol/L (ref 135–145)
Total Bilirubin: 0.3 mg/dL (ref 0.3–1.2)
Total Protein: 7.1 g/dL (ref 6.5–8.1)

## 2015-01-11 LAB — URINALYSIS, ROUTINE W REFLEX MICROSCOPIC
Bilirubin Urine: NEGATIVE
Glucose, UA: NEGATIVE mg/dL
Hgb urine dipstick: NEGATIVE
Ketones, ur: 15 mg/dL — AB
Leukocytes, UA: NEGATIVE
Nitrite: NEGATIVE
Protein, ur: NEGATIVE mg/dL
Specific Gravity, Urine: 1.013 (ref 1.005–1.030)
Urobilinogen, UA: 0.2 mg/dL (ref 0.0–1.0)
pH: 6 (ref 5.0–8.0)

## 2015-01-11 LAB — LIPASE, BLOOD: Lipase: 35 U/L (ref 22–51)

## 2015-01-11 MED ORDER — IOHEXOL 300 MG/ML  SOLN
100.0000 mL | Freq: Once | INTRAMUSCULAR | Status: AC | PRN
  Administered 2015-01-11: 100 mL via INTRAVENOUS

## 2015-01-11 MED ORDER — ONDANSETRON 4 MG PO TBDP
4.0000 mg | ORAL_TABLET | Freq: Once | ORAL | Status: AC | PRN
  Administered 2015-01-11: 4 mg via ORAL

## 2015-01-11 MED ORDER — SODIUM CHLORIDE 0.9 % IV BOLUS (SEPSIS)
1000.0000 mL | Freq: Once | INTRAVENOUS | Status: AC
  Administered 2015-01-11: 1000 mL via INTRAVENOUS

## 2015-01-11 MED ORDER — MORPHINE SULFATE (PF) 4 MG/ML IV SOLN
4.0000 mg | Freq: Once | INTRAVENOUS | Status: AC
  Administered 2015-01-11: 4 mg via INTRAVENOUS
  Filled 2015-01-11: qty 1

## 2015-01-11 MED ORDER — ONDANSETRON HCL 4 MG/2ML IJ SOLN
4.0000 mg | Freq: Once | INTRAMUSCULAR | Status: AC
  Administered 2015-01-11: 4 mg via INTRAVENOUS
  Filled 2015-01-11: qty 2

## 2015-01-11 MED ORDER — ONDANSETRON 4 MG PO TBDP
ORAL_TABLET | ORAL | Status: AC
  Filled 2015-01-11: qty 1

## 2015-01-11 MED ORDER — IOHEXOL 300 MG/ML  SOLN
25.0000 mL | Freq: Once | INTRAMUSCULAR | Status: AC | PRN
  Administered 2015-01-11: 25 mL via ORAL

## 2015-01-11 NOTE — ED Notes (Signed)
C/o right lower quadrant abdominal pain, nausea and vomiting, vomited 2 times today.  Patient woke with abdominal pain around 3:00 am.  Last ate at breakfast.

## 2015-01-11 NOTE — ED Notes (Signed)
Pt reports onset of RLQ pain last night, more severe today and having n/v with low-grade fever.Kim Pham to ucc and sent here for further eval.

## 2015-01-11 NOTE — ED Provider Notes (Signed)
CSN: [id number]     Arrival date & time 01/11/15  1720 History   First MD Initiated Contact with Patient 01/11/15 1815     Chief Complaint  Patient presents with  . Abdominal Pain   (Consider location/radiation/quality/duration/timing/severity/associated sxs/prior Treatment) Patient is a 34 y.o. female presenting with abdominal pain. The history is provided by the patient.  Abdominal Pain Pain location:  RLQ Pain quality: cramping and dull   Pain radiates to:  Periumbilical region Pain severity:  Moderate Onset quality:  Sudden Duration:  18 hours Progression:  Worsening (pain incr with riding in car to [facility].) Chronicity:  New Context: awakening from sleep   Context comment:  At 3am Relieved by:  None tried Worsened by:  Nothing tried Ineffective treatments:  None tried Associated symptoms: anorexia, nausea and vomiting   Associated symptoms: no diarrhea, no dysuria, no fever, no vaginal bleeding and no vaginal discharge   Associated symptoms comment:  Last ate bkfast, no urinary sx/ s/p hyst .   Past Medical History  Diagnosis Date  . GERD (gastroesophageal reflux disease)     tums otc, prevacid prn  . Anxiety   . Low blood pressure     pt states history of low blood pressure-fainted 12/2010-instructed to move slowly   Past Surgical History  Procedure Laterality Date  . Abdominal hysterectomy  2007  . Cystoscopy  2007  . Diagnostic laparoscopy  2005  . Rotator cuff repair  2010    right shoulder  . Salpingoophorectomy  04/16/2011    Procedure: SALPINGO OOPHERECTOMY;  Surgeon: Lovenia Kim, MD;  Location: [facility];  Service: Gynecology;  Laterality: Right;   No family history on file. Social History  Substance Use Topics  . Smoking status: Former Smoker    Quit date: 04/04/2004  . Smokeless tobacco: Not on file  . Alcohol Use: No   OB History    No data available     Review of Systems  Constitutional: Negative for fever.  HENT: Negative.   Respiratory:  Negative.   Cardiovascular: Negative.   Gastrointestinal: Positive for nausea, vomiting, abdominal pain and anorexia. Negative for diarrhea.  Genitourinary: Negative.  Negative for dysuria, vaginal bleeding, vaginal discharge, menstrual problem and pelvic pain.  All other systems reviewed and are negative.   Allergies  Review of patient's allergies indicates no known allergies.  Home Medications   Prior to Admission medications   Medication Sig Start Date End Date Taking? Authorizing Provider  acetaminophen (TYLENOL) 500 MG tablet Take 1,000 mg by mouth every 6 (six) hours as needed. For pain      Historical Provider, MD  Multiple Vitamins-Minerals (MULTIVITAMIN WITH MINERALS) tablet Take 1 tablet by mouth daily.      Historical Provider, MD  traZODone (DESYREL) 150 MG tablet Take 150 mg by mouth at bedtime.      Historical Provider, MD   Meds Ordered and Administered this Visit  Medications - No data to display  BP 151/80 mmHg  Pulse 83  Temp(Src) 98.1 F (36.7 C) (Oral)  Resp 16  SpO2 100% No data found.   Physical Exam  Constitutional: She is oriented to person, place, and time. She appears well-developed and well-nourished. No distress.  Neck: Normal range of motion. Neck supple.  Cardiovascular: Normal rate and normal heart sounds.   Pulmonary/Chest: Breath sounds normal.  Abdominal: Soft. She exhibits no distension and no mass. Bowel sounds are decreased. There is no hepatosplenomegaly. There is tenderness in the right lower quadrant. There  is rigidity, rebound, guarding and tenderness at McBurney's point. There is no CVA tenderness.    Lymphadenopathy:    She has no cervical adenopathy.  Neurological: She is alert and oriented to person, place, and time.  Skin: Skin is warm and dry.  Nursing note and vitals reviewed.   ED Course  Procedures (including critical care time)  Labs Review Labs Reviewed - No data to display  Imaging Review No results  found.   Visual Acuity Review  Right Eye Distance:   Left Eye Distance:   Bilateral Distance:    Right Eye Near:   Left Eye Near:    Bilateral Near:         MDM   1. Abdominal pain, RLQ    Sent for eval of likely appendicitis.    Billy Fischer, MD 01/11/15 [phone]

## 2015-01-11 NOTE — ED Provider Notes (Signed)
CSN: [id number]     Arrival date & time 01/11/15  1846 History   First MD Initiated Contact with Patient 01/11/15 2110     Chief Complaint  Patient presents with  . Abdominal Pain     (Consider location/radiation/quality/duration/timing/severity/associated sxs/prior Treatment) HPI Comments: Patient is a 34 year old female with no significant past medical history who presents with abdomina pain for the past 2 days. The pain started periumbilically and has now localized to the RLQ and does not radiate. The pain is described as cramping and aching and severe. The pain started gradually and progressively worsened since the onset. No alleviating/aggravating factors. The patient has tried nothing for symptoms without relief. Associated symptoms include nausea, vomiting, and subjective fever. Patient denies headache, diarrhea, chest pain, SOB, dysuria, constipation. Previous abdominal surgery includes hysterectomy.   Patient is a 34 y.o. female presenting with abdominal pain.  Abdominal Pain Associated symptoms: nausea and vomiting     Past Medical History  Diagnosis Date  . GERD (gastroesophageal reflux disease)     tums otc, prevacid prn  . Anxiety   . Low blood pressure     pt states history of low blood pressure-fainted 12/2010-instructed to move slowly   Past Surgical History  Procedure Laterality Date  . Abdominal hysterectomy  2007  . Cystoscopy  2007  . Diagnostic laparoscopy  2005  . Rotator cuff repair  2010    right shoulder  . Salpingoophorectomy  04/16/2011    Procedure: SALPINGO OOPHERECTOMY;  Surgeon: Lovenia Kim, MD;  Location: [facility];  Service: Gynecology;  Laterality: Right;   History reviewed. No pertinent family history. Social History  Substance Use Topics  . Smoking status: Former Smoker    Quit date: 04/04/2004  . Smokeless tobacco: None  . Alcohol Use: No   OB History    No data available     Review of Systems  Gastrointestinal: Positive for  nausea, vomiting and abdominal pain.  All other systems reviewed and are negative.     Allergies  Review of patient's allergies indicates no known allergies.  Home Medications   Prior to Admission medications   Medication Sig Start Date End Date Taking? Authorizing Provider  acetaminophen (TYLENOL) 500 MG tablet Take 1,000 mg by mouth every 6 (six) hours as needed. For pain      Historical Provider, MD  Multiple Vitamins-Minerals (MULTIVITAMIN WITH MINERALS) tablet Take 1 tablet by mouth daily.      Historical Provider, MD  traZODone (DESYREL) 150 MG tablet Take 150 mg by mouth at bedtime.      Historical Provider, MD   BP 147/83 mmHg  Pulse 98  Temp(Src) 98.3 F (36.8 C) (Oral)  Resp 18  SpO2 98% Physical Exam  Constitutional: She is oriented to person, place, and time. She appears well-developed and well-nourished. No distress.  HENT:  Head: Normocephalic and atraumatic.  Eyes: Conjunctivae and EOM are normal.  Neck: Normal range of motion.  Cardiovascular: Normal rate and regular rhythm.  Exam reveals no gallop and no friction rub.   No murmur heard. Pulmonary/Chest: Effort normal and breath sounds normal. She has no wheezes. She has no rales. She exhibits no tenderness.  Abdominal: Soft. She exhibits no distension. There is tenderness. There is no rebound.  RLQ tenderness to palpation at McBurney's point. Positive rovsing's. No peritoneal signs.   Musculoskeletal: Normal range of motion.  Neurological: She is alert and oriented to person, place, and time. Coordination normal.  Speech is goal-oriented. Moves  limbs without ataxia.   Skin: Skin is warm and dry.  Psychiatric: She has a normal mood and affect. Her behavior is normal.  Nursing note and vitals reviewed.   ED Course  Procedures (including critical care time) Labs Review Labs Reviewed  URINALYSIS, ROUTINE W REFLEX MICROSCOPIC (NOT AT [facility]) - Abnormal; Notable for the following:    Ketones, ur 15 (*)    All  other components within normal limits  LIPASE, BLOOD  COMPREHENSIVE METABOLIC PANEL  CBC    Imaging Review Ct Abdomen Pelvis W Contrast  01/12/2015   CLINICAL DATA:  Right lower quadrant abdominal pain, nausea and vomiting.  EXAM: CT ABDOMEN AND PELVIS WITH CONTRAST  TECHNIQUE: Multidetector CT imaging of the abdomen and pelvis was performed using the standard protocol following bolus administration of intravenous contrast.  CONTRAST:  160mL OMNIPAQUE IOHEXOL 300 MG/ML  SOLN  COMPARISON:  07/14/2011  FINDINGS: The lung bases are clear.  The liver, spleen, gallbladder, pancreas, adrenal glands, kidneys, abdominal aorta, inferior vena cava, and retroperitoneal lymph nodes are unremarkable. Stomach, small bowel, and colon are not abnormally distended. No free air or free fluid in the abdomen.  Pelvis: The appendix is normal. Bladder wall is not thickened. No free or loculated pelvic fluid collections. Uterus is surgically absent. Cyst in the left ovary measuring 3 cm diameter, likely functional. No follow-up is indicated in a premenopausal patient. No pelvic mass or lymphadenopathy.  IMPRESSION: No acute process demonstrated in the abdomen or pelvis. No evidence of bowel obstruction or inflammation. Appendix is normal.   Electronically Signed   By: Lucienne Capers M.D.   On: 01/12/2015 00:18   I have personally reviewed and evaluated these images and lab results as part of my medical decision-making.   EKG Interpretation None      MDM   Final diagnoses:  Right lower quadrant abdominal pain  Non-intractable vomiting with nausea, vomiting of unspecified type    9:26 PM Labs unremarkable for acute changes. Patient pending CT abdomen pelvis to rule out appendicitis. Vitals stable and patient afebrile. Patient will have fluids, morphine, and zofran.   12:45 AM Patient's CT unremarkable for acute changes. Patient will be discharged with pain medication and zofran for symptoms.   Alvina Chou, PA-C 01/12/15 0631  Harvel Quale, MD 01/13/15 1356

## 2015-01-12 MED ORDER — SODIUM CHLORIDE 0.9 % IV BOLUS (SEPSIS)
1000.0000 mL | Freq: Once | INTRAVENOUS | Status: AC
  Administered 2015-01-12: 1000 mL via INTRAVENOUS

## 2015-01-12 MED ORDER — KETOROLAC TROMETHAMINE 30 MG/ML IJ SOLN
30.0000 mg | Freq: Once | INTRAMUSCULAR | Status: AC
  Administered 2015-01-12: 30 mg via INTRAVENOUS
  Filled 2015-01-12: qty 1

## 2015-01-12 MED ORDER — ONDANSETRON HCL 4 MG PO TABS: 4.0000 mg | ORAL_TABLET | Freq: Three times a day (TID) | ORAL | Status: DC | PRN

## 2015-01-12 MED ORDER — OXYCODONE-ACETAMINOPHEN 5-325 MG PO TABS: 2.0000 | ORAL_TABLET | ORAL | Status: DC | PRN

## 2015-01-12 NOTE — Discharge Instructions (Signed)
Take percocet as needed for pain. Take zofran as needed for nausea. Refer to attached documents for more information. Return to the ED with worsening or concerning symptoms.

## 2015-01-20 ENCOUNTER — Telehealth: Payer: Self-pay | Admitting: Pulmonary Disease

## 2015-01-20 NOTE — Telephone Encounter (Signed)
Pt c/o L ear pain, requesting recs from SN.   Please advise.  Thanks!

## 2015-01-21 NOTE — Telephone Encounter (Signed)
Kim Pham presented w/ left ear discomfort; no drainage/ blood/ or f/c/s... Exam showed right EAC & drum wnl; left canal & drum sl red w/o bulging; no adenopathy... PLAN>> Rx w/ cortisporin otic suspension-- 2-3 drops in left ear Tid

## 2015-03-11 ENCOUNTER — Encounter: Payer: Self-pay | Admitting: Pulmonary Disease

## 2015-03-11 NOTE — Progress Notes (Signed)
Patient ID: Kim Pham, female   DOB: 11-14-1980, 34 y.o.   MRN: [id number]      Kim Pham presented w/ several day hx URI, sinus congestion, frontal HAs and inability to rest;  She notes post nasal drip, sinus congestion w/ pressure but only blowing clear mucus, no color/ no blood;  She denies cough, coughing up phlegm, CP, SOB, or fever;  She has tried some OTC Claritin and Ibuprofen w/ some improvement but she has been unable to sleep (out of her Trazadone Rx)...      EXAM shows Afeb, VSS w/ BP=140/80, P=76 reg;  HEENT- neg, mallampati1 w/o exud or adenopathy, frontal sinuses sl tender on percussion;  Chest- clear w/o w/r/r;  Heart- RR w/o m/r/g... IMP/PLAN>>  Kim Pham appears to have a viral URI w/ clear drainage + some nasal congestion & frontal HA;  She is starting to improve w/ OTC meds Claritin & Ibuprofen;  Reminded to use Mucinex 600mg  1-2Bid w/ fluids and try a nasal saline spray prn;  She is unable to sleep w/o her Trazadone & this was refilled per request => called to [facility] outpt pharm... [facility]

## 2015-05-08 MED FILL — traZODone HCL 100 MG TABS: 100 | 30 days supply | Qty: 60 | Fill #2

## 2015-06-11 MED FILL — traZODone HCL 100 MG TABS: 100 | 30 days supply | Qty: 60 | Fill #3

## 2015-07-14 MED FILL — traZODone HCL 100 MG TABS: 100 | 30 days supply | Qty: 60 | Fill #4

## 2015-08-13 MED FILL — traZODone HCL 100 MG TABS: 100 | 30 days supply | Qty: 60 | Fill #5

## 2015-09-09 DIAGNOSIS — Z803 Family history of malignant neoplasm of breast: Secondary | ICD-10-CM

## 2015-09-09 HISTORY — DX: Family history of malignant neoplasm of breast: Z80.3

## 2015-11-05 MED FILL — HYDROCODONE-HOMATROPINE SYR: 5-1.5 | 3 days supply | Qty: 120 | Fill #0

## 2015-12-18 ENCOUNTER — Telehealth: Payer: Self-pay | Admitting: *Deleted

## 2015-12-18 NOTE — Telephone Encounter (Signed)
Unable to reach patient at time of Pre-Visit Call.  Left message for patient to return call when available.    

## 2015-12-19 ENCOUNTER — Ambulatory Visit (HOSPITAL_BASED_OUTPATIENT_CLINIC_OR_DEPARTMENT_OTHER)
Admission: RE | Admit: 2015-12-19 | Discharge: 2015-12-19 | Disposition: A | Discharge status: Home / Self Care | Payer: PRIVATE HEALTH INSURANCE | Source: Ambulatory Visit | Attending: Family | Admitting: Family

## 2015-12-19 ENCOUNTER — Encounter: Payer: Self-pay | Admitting: Family

## 2015-12-19 ENCOUNTER — Ambulatory Visit (INDEPENDENT_AMBULATORY_CARE_PROVIDER_SITE_OTHER): Payer: PRIVATE HEALTH INSURANCE | Admitting: Family

## 2015-12-19 VITALS — BP 106/72 | HR 71 | Temp 97.9°F | Resp 16 | Ht 69.0 in | Wt 170.2 lb

## 2015-12-19 DIAGNOSIS — R51 Headache: Secondary | ICD-10-CM | POA: Diagnosis not present

## 2015-12-19 DIAGNOSIS — R404 Transient alteration of awareness: Secondary | ICD-10-CM | POA: Insufficient documentation

## 2015-12-19 DIAGNOSIS — R4020 Unspecified coma: Secondary | ICD-10-CM

## 2015-12-19 DIAGNOSIS — R519 Headache, unspecified: Secondary | ICD-10-CM

## 2015-12-19 DIAGNOSIS — Q0700 Arnold-Chiari syndrome without spina bifida or hydrocephalus: Secondary | ICD-10-CM

## 2015-12-19 DIAGNOSIS — F411 Generalized anxiety disorder: Secondary | ICD-10-CM

## 2015-12-19 DIAGNOSIS — IMO0002 Reserved for concepts with insufficient information to code with codable children: Secondary | ICD-10-CM | POA: Insufficient documentation

## 2015-12-19 NOTE — Patient Instructions (Signed)
Please avoid driving until cleared by neurology. You will be contacted about your referral to neurology and your EEG.  CT head will be completed today on the first floor. Go to ER if recurrent numbness/weakness/slurred speech.

## 2015-12-19 NOTE — Progress Notes (Signed)
Subjective:    Patient ID: Kim Pham, female    DOB: 08-06-1980, 35 y.o.   MRN: [id number]  HPI  Kim Pham is a 35 yr old female who presents today to establish care.    Migraines- reports hx of migraines.  Most recently, she has been experiencing HA which radiates from the neck to the top of her head.  Symptoms began 3 weeks ago.  Pain tends to be She reports that when the pain comes  right sided.  Reports that the top of her head feels numb and her lips feel numb with this occurs she develops a "blank stare."  Reports that she lost 5 minutes while driving on the road yesterday.  Husband notes a mild right handed tremor while sleeping. Denies bowel/bladder incontinence.  Notes that she is stumbling more over her right foot while walking.  Has seen [facility] neuro in the past.    She has hx of Arnold Chiari malformation- had decompression surgery.    Anxiety- stable off of meds.   Review of Systems  Constitutional: Negative for unexpected weight change.  HENT: Negative for hearing loss and rhinorrhea.   Eyes: Negative for visual disturbance.  Respiratory: Negative for cough and shortness of breath.   Cardiovascular: Negative for chest pain and leg swelling.  Gastrointestinal: Negative for constipation and diarrhea.  Genitourinary: Negative for dysuria and frequency.  Musculoskeletal: Negative for arthralgias and myalgias.  Skin: Negative for rash.  Neurological: Positive for headaches.  Hematological: Negative for adenopathy.  Psychiatric/Behavioral:       Denies depression       Past Medical History:  Diagnosis Date  . Anxiety   . Endometriosis   . GERD (gastroesophageal reflux disease)    tums otc, prevacid prn  . Low blood pressure    pt states history of low blood pressure-fainted 12/2010-instructed to move slowly     Social History   Social History  . Marital status: Married    Spouse name: N/A  . Number of children: N/A  . Years of education: N/A    Occupational History  . Not on file.   Social History Main Topics  . Smoking status: Former Smoker    Quit date: 04/04/2004  . Smokeless tobacco: Not on file  . Alcohol use No  . Drug use: No  . Sexual activity: Not on file   Other Topics Concern  . Not on file   Social History Narrative   Works as an Therapist, sports at [employer]   Married   1 son- born 2004 has autism   Enjoys exercise, special olympics with son    Past Surgical History:  Procedure Laterality Date  . ABDOMINAL HYSTERECTOMY  2007  . CYSTOSCOPY  2007  . DIAGNOSTIC LAPAROSCOPY  2005  . ROTATOR CUFF REPAIR  2010   right shoulder  . SALPINGOOPHORECTOMY  04/16/2011   Procedure: SALPINGO OOPHERECTOMY;  Surgeon: Lovenia Kim, MD;  Location: [facility];  Service: Gynecology;  Laterality: Right;    Family History  Problem Relation Age of Onset  . Hypertension Mother   . CAD Mother   . Hypertension Father   . CAD Father   . CVA Father     No Known Allergies  Current Outpatient Prescriptions on File Prior to Visit  Medication Sig Dispense Refill  . acetaminophen (TYLENOL) 500 MG tablet Take 1,000 mg by mouth every 6 (six) hours as needed. For pain      . Multiple Vitamins-Minerals (MULTIVITAMIN  WITH MINERALS) tablet Take 1 tablet by mouth daily.       No current facility-administered medications on file prior to visit.     BP 106/72 (BP Location: Right Arm, Patient Position: Sitting, Cuff Size: Normal)   Pulse 71   Temp 97.9 F (36.6 C) (Oral)   Resp 16   Ht 5\' 9"  (1.753 m)   Wt 170 lb 3.2 oz (77.2 kg)   SpO2 100%   BMI 25.13 kg/m    Objective:   Physical Exam  Constitutional: She is oriented to person, place, and time. She appears well-developed and well-nourished.  HENT:  Head: Normocephalic and atraumatic.  Eyes: EOM are normal. Pupils are equal, round, and reactive to light.  Cardiovascular: Normal rate, regular rhythm and normal heart sounds.   No murmur heard. Pulmonary/Chest:  Effort normal and breath sounds normal. No respiratory distress. She has no wheezes.  Neurological: She is alert and oriented to person, place, and time. She exhibits normal muscle tone. Coordination normal.  Skin: Skin is warm and dry.  Psychiatric: She has a normal mood and affect. Her behavior is normal. Judgment and thought content normal.          Assessment & Plan:  Declines flu shot today, will get at work.  Headaches- given her hx of chiari malformation and surgery as well as recent ? Seizure activity will obtain CT head today. Will also request EEG and refer to neurology.  I have advised pt not to drive until cleared by neurology due to possible seizure activity. Her facial numbness could be due to atypical migraine, though need to rule out CVA.  Also, MS is a consideration- may eventually need MRI as well but will defer to neurology.

## 2015-12-19 NOTE — Progress Notes (Signed)
Pre visit review using our clinic review tool, if applicable. No additional management support is needed unless otherwise documented below in the visit note. 

## 2015-12-19 NOTE — Assessment & Plan Note (Signed)
Stable off of meds.  Monitor.  

## 2015-12-21 ENCOUNTER — Encounter: Payer: Self-pay | Admitting: Family

## 2015-12-26 ENCOUNTER — Ambulatory Visit (HOSPITAL_COMMUNITY)
Admission: RE | Admit: 2015-12-26 | Discharge: 2015-12-26 | Disposition: A | Discharge status: Home / Self Care | Payer: PRIVATE HEALTH INSURANCE | Source: Ambulatory Visit | Attending: Family | Admitting: Family

## 2015-12-26 DIAGNOSIS — R55 Syncope and collapse: Secondary | ICD-10-CM | POA: Diagnosis not present

## 2015-12-26 DIAGNOSIS — R404 Transient alteration of awareness: Secondary | ICD-10-CM

## 2015-12-26 DIAGNOSIS — R9401 Abnormal electroencephalogram [EEG]: Secondary | ICD-10-CM | POA: Insufficient documentation

## 2015-12-26 DIAGNOSIS — R4020 Unspecified coma: Secondary | ICD-10-CM

## 2015-12-26 NOTE — Procedures (Signed)
ELECTROENCEPHALOGRAM REPORT  Date of Study: 12/26/2015  Patient's Name: Kim Pham MRN: [id number] Date of Birth: 16-Jun-1980  Referring Provider: Debbrah Alar, NP  Clinical History: This is a 35 year old woman with headaches, numbness, then "blank stare." She had an episode of loss of time while driving.  Medications: multivitamins  Technical Summary: A multichannel digital EEG recording measured by the international 10-20 system with electrodes applied with paste and impedances below 5000 ohms performed in our laboratory with EKG monitoring in an awake and asleep patient.  Hyperventilation and photic stimulation were performed.  The digital EEG was referentially recorded, reformatted, and digitally filtered in a variety of bipolar and referential montages for optimal display.    Description: The patient is awake and asleep during the recording.  During maximal wakefulness, there is a symmetric, medium voltage 9.5 Hz posterior dominant rhythm that attenuates with eye opening.  There is occasional focal 4-5 Hz theta slowing seen over the left temporal region, at times sharply contoured without clear epileptogenic potential. During drowsiness and sleep, there is an increase in theta slowing of the background.  Vertex waves and symmetric sleep spindles were seen.  Hyperventilation and photic stimulation did not elicit any abnormalities.  There were no clear epileptiform discharges or electrographic seizures seen.    EKG lead was unremarkable.  Impression: This awake and asleep EEG is abnormal due to occasional focal slowing over the left temporal region.  Clinical Correlation of the above findings indicates focal cerebral dysfunction over the left temporal region, suggestive of underlying structural or physiologic abnormality. The absence of epileptiform discharges does not exclude a clinical diagnosis of epilepsy.  Clinical correlation is advised.   Ellouise Newer, M.D.

## 2015-12-26 NOTE — Progress Notes (Signed)
EEG completed; results pending.    

## 2015-12-27 ENCOUNTER — Encounter: Payer: Self-pay | Admitting: Family

## 2016-01-01 ENCOUNTER — Ambulatory Visit (INDEPENDENT_AMBULATORY_CARE_PROVIDER_SITE_OTHER): Payer: PRIVATE HEALTH INSURANCE | Admitting: Neurology

## 2016-01-01 ENCOUNTER — Encounter: Payer: Self-pay | Admitting: *Deleted

## 2016-01-01 ENCOUNTER — Encounter: Payer: Self-pay | Admitting: Neurology

## 2016-01-01 ENCOUNTER — Telehealth: Payer: Self-pay | Admitting: Neurology

## 2016-01-01 VITALS — BP 106/59 | HR 72 | Resp 16 | Ht 69.0 in | Wt 171.0 lb

## 2016-01-01 DIAGNOSIS — R404 Transient alteration of awareness: Secondary | ICD-10-CM | POA: Diagnosis not present

## 2016-01-01 DIAGNOSIS — F05 Delirium due to known physiological condition: Secondary | ICD-10-CM | POA: Diagnosis not present

## 2016-01-01 DIAGNOSIS — R410 Disorientation, unspecified: Secondary | ICD-10-CM

## 2016-01-01 HISTORY — DX: Transient alteration of awareness: R40.4

## 2016-01-01 MED ORDER — TOPIRAMATE ER 50 MG PO CAP24
ORAL_CAPSULE | ORAL | 12 refills | Status: DC
Start: 2016-01-01 — End: 2016-07-22

## 2016-01-01 MED ORDER — TOPIRAMATE ER 100 MG PO CAP24: ORAL_CAPSULE | ORAL | 12 refills | Status: DC

## 2016-01-01 MED ORDER — ALPRAZOLAM 0.5 MG PO TABS: ORAL_TABLET | ORAL | 0 refills | Status: DC

## 2016-01-01 NOTE — Telephone Encounter (Signed)
Noted. Awaiting AA. MD signature and then will fax.

## 2016-01-01 NOTE — Patient Instructions (Addendum)
Remember to drink plenty of fluid, eat healthy meals and do not skip any meals. Try to eat protein with a every meal and eat a healthy snack such as fruit or nuts in between meals. Try to keep a regular sleep-wake schedule and try to exercise daily, particularly in the form of walking, 20-30 minutes a day, if you can.   As far as your medications are concerned, I would like to suggest: Trokendi: Start 25mg  for one week, then 50mg  one week, then 100mg  one week, then 150mg  for 4 weeks. May increase further.   As far as diagnostic testing: MRI brain, prolonged EEG in the office/3-day EEG, Labs  Our phone number is [phone]. We also have an after hours call service for urgent matters and there is a physician on-call for urgent questions. For any emergencies you know to call 911 or go to the nearest emergency room  You are unable to drive, operate heavy machinery, perform activities at heights or participate in water activities until 6 months seizure free  Topiramate extended-release capsules What is this medicine? TOPIRAMATE (toe PYRE a mate) is used to treat seizures in adults or children with epilepsy. This medicine may be used for other purposes; ask your health care provider or pharmacist if you have questions. What should I tell my health care provider before I take this medicine? They need to know if you have any of these conditions: -cirrhosis of the liver or liver disease -diarrhea -glaucoma -kidney stones or kidney disease -lung disease like asthma, obstructive pulmonary disease, emphysema -metabolic acidosis -on a ketogenic diet -scheduled for surgery or a procedure -suicidal thoughts, plans, or attempt; a previous suicide attempt by you or a family member -an unusual or allergic reaction to topiramate, other medicines, foods, dyes, or preservatives -pregnant or trying to get pregnant -breast-feeding How should I use this medicine? Take this medicine by mouth with a glass of  water. Follow the directions on the prescription label. Trokendi XR capsules must be swallowed whole. Do not sprinkle on food, break, crush, dissolve, or chew. Qudexy XR capsules may be swallowed whole or opened and sprinkled on a small amount of soft food. This mixture must be swallowed immediately. Do not chew or store mixture for later use. You may take this medicine with meals. Take your medicine at regular intervals. Do not take it more often than directed. Talk to your pediatrician regarding the use of this medicine in children. Special care may be needed. While Trokendi XR may be prescribed for children as young as 6 years and Qudexy XR may be prescribed for children as young as 2 years for selected conditions, precautions do apply. Overdosage: If you think you have taken too much of this medicine contact a poison control center or emergency room at once. NOTE: This medicine is only for you. Do not share this medicine with others. What if I miss a dose? If you miss a dose, take it as soon as you can. If it is almost time for your next dose, take only that dose. Do not take double or extra doses. What may interact with this medicine? Do not take this medicine with any of the following medications: -probenecid This medicine may also interact with the following medications: -acetazolamide -alcohol -amitriptyline -birth control pills -digoxin -hydrochlorothiazide -lithium -medicines for pain, sleep, or muscle relaxation -metformin -methazolamide -other seizure or epilepsy medicines -pioglitazone -risperidone This list may not describe all possible interactions. Give your health care provider a list of all  the medicines, herbs, non-prescription drugs, or dietary supplements you use. Also tell them if you smoke, drink alcohol, or use illegal drugs. Some items may interact with your medicine. What should I watch for while using this medicine? Visit your doctor or health care professional for  regular checks on your progress. Do not stop taking this medicine suddenly. This increases the risk of seizures if you are using this medicine to control epilepsy. Wear a medical identification bracelet or chain to say you have epilepsy or seizures, and carry a card that lists all your medicines. This medicine can decrease sweating and increase your body temperature. Watch for signs of deceased sweating or fever, especially in children. Avoid extreme heat, hot baths, and saunas. Be careful about exercising, especially in hot weather. Contact your health care provider right away if you notice a fever or decrease in sweating. You should drink plenty of fluids while taking this medicine. If you have had kidney stones in the past, this will help to reduce your chances of forming kidney stones. If you have stomach pain, with nausea or vomiting and yellowing of your eyes or skin, call your doctor immediately. You may get drowsy, dizzy, or have blurred vision. Do not drive, use machinery, or do anything that needs mental alertness until you know how this medicine affects you. To reduce dizziness, do not sit or stand up quickly, especially if you are an older patient. Alcohol can increase drowsiness and dizziness. Avoid alcoholic drinks. Do not drink alcohol for 6 hours before or 6 hours after taking Trokendi XR. If you notice blurred vision, eye pain, or other eye problems, seek medical attention at once for an eye exam. The use of this medicine may increase the chance of suicidal thoughts or actions. Pay special attention to how you are responding while on this medicine. Any worsening of mood, or thoughts of suicide or dying should be reported to your health care professional right away. This medicine may increase the chance of developing metabolic acidosis. If left untreated, this can cause kidney stones, bone disease, or slowed growth in children. Symptoms include breathing fast, fatigue, loss of appetite,  irregular heartbeat, or loss of consciousness. Call your doctor immediately if you experience any of these side effects. Also, tell your doctor about any surgery you plan on having while taking this medicine since this may increase your risk for metabolic acidosis. Birth control pills may not work properly while you are taking this medicine. Talk to your doctor about using an extra method of birth control. Women who become pregnant while using this medicine may enroll in the [place] Pregnancy Registry by calling [phone]. This registry collects information about the safety of antiepileptic drug use during pregnancy. What side effects may I notice from receiving this medicine? Side effects that you should report to your doctor or health care professional as soon as possible: -allergic reactions like skin rash, itching or hives, swelling of the face, lips, or tongue -decreased sweating and/or rise in body temperature -depression -difficulty breathing, fast or irregular breathing patterns -difficulty speaking -difficulty walking or controlling muscle movements -hearing impairment -redness, blistering, peeling or loosening of the skin, including inside the mouth -tingling, pain or numbness in the hands or feet -unusually weak or tired -worsening of mood, thoughts or actions of suicide or dying Side effects that usually do not require medical attention (Report these to your doctor or health care professional if they continue or are bothersome.): -altered taste -back pain,  joint or muscle aches and pains -diarrhea, or constipation -headache -loss of appetite -nausea -stomach upset, indigestion -tremors This list may not describe all possible side effects. Call your doctor for medical advice about side effects. You may report side effects to FDA at 1-800-FDA-1088. Where should I keep my medicine? Keep out of the reach of children. Store at room temperature between  15 and 30 degrees C (59 and 86 degrees F) in a tightly closed container. Protect from moisture. Throw away any unused medicine after the expiration date. NOTE: This sheet is a summary. It may not cover all possible information. If you have questions about this medicine, talk to your doctor, pharmacist, or health care provider.    2016, [employer]vier/Gold Standard. (2012-07-04 15:33:26)

## 2016-01-01 NOTE — Telephone Encounter (Signed)
Kim Pham, can you order this for me please? Give 3 tabs.

## 2016-01-01 NOTE — Progress Notes (Signed)
[facility]    Provider:  Dr Jaynee Eagles Referring Provider: Debbrah Alar, NP Primary Care Physician:  Nance Pear., NP  CC:  Headache, passing out, disorientation and twitches  HPI:  Kim Pham is a 35 y.o. female here as a referral from Dr. Inda Castle for headache and syncopal episodes.PMHx migraines and chiari malformation s/p decompression surgery.  In 2005 she was "randomly passing out" and having headaches and she was finding herself on the floor. A mild chiari malformation of 66mm was found and she had decompression surgery which helped. This past June her headaches started changing and worsening. Migraines usually on the right side but lately she has intense debilitating pain on the top of her head. 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 She does not know where she is when these episodes resolve. She does urinate.defecate, she works at a nursing home. This past week she has had 3 of them but usually 1-2x a week at work and at home. She gets disoriented when driving and she is losing time driving. She is losing words. No triggers at work, no significant stress. Sometimes the episodes start with a headache sometimes they do not. She feels anxious before it comes on, she feels jittery. She is having the headaches almost every day. She cannot have children and she had  A partial hysterectomy. Father with migraines. PGF with seizures. No other focal neurologic deficits or complaints, associates symptoms or modifying factors.    Headaches: Imitrex  Reviewed notes, labs and imaging from outside physicians, which showed:   Impression: This awake and asleep EEG is abnormal due to occasional  focal slowing over the left temporal region.  Clinical Correlation of the above findings indicates focal cerebral dysfunction over the left temporal region, suggestive of underlying structural or physiologic abnormality. The absence of epileptiform discharges does not exclude a clinical diagnosis of epilepsy.  Clinical correlation is advised.  CT head 12/2015 (personally reviewed images): Brain: No evidence of infarction, hemorrhage, hydrocephalus, extra-axial collection or mass lesion/mass effect.  Vascular: No hyperdense vessel or unexpected calcification.  Skull: Suboccipital craniotomy noted without other significant abnormality.  Sinuses/Orbits: Visualized portions unremarkable.  Other: None  IMPRESSION: No evidence of intracranial abnormality.  Clinical data: Syncope; headache; fell and hit head  MRI BRAIN WITHOUT CONTRAST 2005 The ventricles are normal in size. There is no infarct or mass. White matter has normal signal. There is no evidence of intracranial blood or fluid collection.   The cerebellar tonsils are approximately 6 mm below the foramen magnum compatible with a mild Chiari malformation.   IMPRESSION  Mild Chiari malformation. No acute intracranial abnormality.  CBC, BMP nomral 01/11/2015  Review of Systems: Patient complains of symptoms per HPI as well as the following symptoms fatigue, confusion, weakness, anxiety, allergies. Pertinent negatives per HPI. All others negative.   Social History   Social History  . Marital status: Married    Spouse name: N/A  . Number of children: 1  . Years of education: Assoc   Occupational History  . Not on file.   Social History Main Topics  . Smoking status: Former Smoker    Quit date: 04/04/2004  .  Smokeless tobacco: Never Used  . Alcohol use No  . Drug use: No  . Sexual activity: Not on file   Other Topics Concern  . Not on file   Social History Narrative   Works as an Therapist, sports at [employer]    Married   1 son- born 2004 has autism   Enjoys exercise, special olympics with son   Drinks 4-5 cups of coffee a day     Family History  Problem Relation Age of Onset  . Hypertension Mother   . CAD Mother   . Breast cancer Mother   . Migraines Mother   . Hypertension Father   . CAD Father   . CVA Father     Past Medical History:  Diagnosis Date  . Anxiety   . Chiari malformation   . Endometriosis   . GERD (gastroesophageal reflux disease)    tums otc, prevacid prn  . Headache   . Low blood pressure    pt states history of low blood pressure-fainted 12/2010-instructed to move slowly    Past Surgical History:  Procedure Laterality Date  . ABDOMINAL HYSTERECTOMY  2007  . CYSTOSCOPY  2007  . DIAGNOSTIC LAPAROSCOPY  2005  . ROTATOR CUFF REPAIR  2010   right shoulder  . SALPINGOOPHORECTOMY  04/16/2011   Procedure: SALPINGO OOPHERECTOMY;  Surgeon: Lovenia Kim, MD;  Location: [facility];  Service: Gynecology;  Laterality: Right;  . skull decompression surgery 2005      Current Outpatient Prescriptions  Medication Sig Dispense Refill  . acetaminophen (TYLENOL) 500 MG tablet Take 1,000 mg by mouth every 6 (six) hours as needed. For pain      . Melatonin 10 MG CAPS Take by mouth.    . traMADol (ULTRAM) 50 MG tablet Take by mouth every 8 (eight) hours as needed.    . vitamin B-12 (CYANOCOBALAMIN) 500 MCG tablet Take 500 mcg by mouth daily.    . Topiramate ER (TROKENDI XR) 100 MG CP24 Take 50mg  and 100mg  at night for a total of 150mg  30 capsule 12  . Topiramate ER 50 MG CP24 Take 50mg  and 100mg  at night for a total of 150mg  30 capsule 12   No current facility-administered medications for this visit.     Allergies as of 01/01/2016  . (No Known Allergies)    Vitals: BP (!) 106/59   Pulse 72   Resp 16   Ht 5\' 9"  (1.753 m)   Wt 171 lb (77.6 kg)   BMI 25.25 kg/m  Last Weight:  Wt Readings from Last 1 Encounters:  01/01/16 171 lb (77.6 kg)   Last Height:   Ht  Readings from Last 1 Encounters:  01/01/16 5\' 9"  (1.753 m)   Physical exam: Exam: Gen: NAD, conversant, well nourised, obese, well groomed                     CV: RRR, no MRG. No Carotid Bruits. No peripheral edema, warm, nontender Eyes: Conjunctivae clear without exudates or hemorrhage  Neuro: Detailed Neurologic Exam  Speech:    Speech is normal; fluent and spontaneous with normal comprehension.  Cognition:    The patient is oriented to person, place, and time;     recent and remote memory intact;     language fluent;     normal attention, concentration,     fund of knowledge Cranial Nerves:    The pupils are equal, round, and reactive to light. The fundi are normal  and spontaneous venous pulsations are present. Visual fields are full to finger confrontation. Extraocular movements are intact. Trigeminal sensation is intact and the muscles of mastication are normal. The face is symmetric. The palate elevates in the midline. Hearing intact. Voice is normal. Shoulder shrug is normal. The tongue has normal motion without fasciculations.   Coordination:    Normal finger to nose and heel to shin. Normal rapid alternating movements.   Gait:    Heel-toe and tandem gait are normal.   Motor Observation:    No asymmetry, no atrophy, and no involuntary movements noted. Tone:    Normal muscle tone.    Posture:    Posture is normal. normal erect    Strength:    Strength is V/V in the upper and lower limbs.      Sensation: intact to LT     Reflex Exam:  DTR's:    Deep tendon reflexes in the upper and lower extremities are normal bilaterally.   Toes:    The toes are downgoing bilaterally.   Clonus:    Clonus is absent.       Assessment/Plan:  35 year old with episodes of altered awareness, focal slowing over the left temporal region, need to evaluate for focal onset seizures. She also has a PMHx of migraines. 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 Discussed side effects as per patient instructions.   MRI w/wo contrast CBC, CMP 90-minute sleep deprived EEG and then possibly EEG 3-day ambulatory eeg if needed.  Patient is unable to drive, operate heavy machinery, perform activities at heights or participate in water activities until 6 months seizure free  Discussed Patients with epilepsy have a small risk of sudden unexpected death, a condition referred to as sudden unexpected death in epilepsy (SUDEP). SUDEP is defined specifically as the sudden, unexpected, witnessed or unwitnessed, nontraumatic and nondrowning death in patients with epilepsy with or without evidence for a seizure, and excluding documented status epilepticus, in which post mortem examination does not reveal a structural or toxicologic cause for death   Call 911 for any other episodes, discussed risk of generalization to convulsive seizures   CC: Dr. Myna Hidalgo, [facility] [address] [place], [place] [address]  Phone [phone] Fax [phone]

## 2016-01-01 NOTE — Telephone Encounter (Signed)
Called and LVM for pt to call back and let us know what pharmacy she would like medication sent to. Gave [facility] phone number. There are 2 on file.

## 2016-01-01 NOTE — Telephone Encounter (Signed)
Pt returned call she uses : Kim Pham, [place] - [address]

## 2016-01-01 NOTE — Progress Notes (Signed)
Faxed completed form to [facility] to schedule pt for in-home 72-hour EEG. Fax: [phone]. Received confirmation.   

## 2016-01-01 NOTE — Telephone Encounter (Signed)
Faxed printed rx to pt pharmacy as requested. Fax: [phone]. Received confirmation.

## 2016-01-02 ENCOUNTER — Telehealth: Payer: Self-pay | Admitting: *Deleted

## 2016-01-02 LAB — COMPREHENSIVE METABOLIC PANEL
ALT: 20 IU/L (ref 0–32)
AST: 21 IU/L (ref 0–40)
Albumin/Globulin Ratio: 1.6 (ref 1.2–2.2)
Albumin: 4.6 g/dL (ref 3.5–5.5)
Alkaline Phosphatase: 53 IU/L (ref 39–117)
BUN/Creatinine Ratio: 18 (ref 9–23)
BUN: 12 mg/dL (ref 6–20)
Bilirubin Total: 0.3 mg/dL (ref 0.0–1.2)
CO2: 23 mmol/L (ref 18–29)
Calcium: 9.8 mg/dL (ref 8.7–10.2)
Chloride: 102 mmol/L (ref 96–106)
Creatinine, Ser: 0.65 mg/dL (ref 0.57–1.00)
GFR calc Af Amer: 133 mL/min/{1.73_m2} (ref >59)
GFR calc non Af Amer: 115 mL/min/{1.73_m2} (ref >59)
Globulin, Total: 2.9 g/dL (ref 1.5–4.5)
Glucose: 91 mg/dL (ref 65–99)
Potassium: 5.2 mmol/L (ref 3.5–5.2)
Sodium: 143 mmol/L (ref 134–144)
Total Protein: 7.5 g/dL (ref 6.0–8.5)

## 2016-01-02 LAB — CBC
Hematocrit: 41.7 % (ref 34.0–46.6)
Hemoglobin: 13 g/dL (ref 11.1–15.9)
MCH: 27.8 pg (ref 26.6–33.0)
MCHC: 31.2 g/dL — ABNORMAL LOW (ref 31.5–35.7)
MCV: 89 fL (ref 79–97)
Platelets: 280 10*3/uL (ref 150–379)
RBC: 4.68 x10E6/uL (ref 3.77–5.28)
RDW: 13.9 % (ref 12.3–15.4)
WBC: 4.6 10*3/uL (ref 3.4–10.8)

## 2016-01-02 NOTE — Telephone Encounter (Signed)
Called and LVM about normal labs per Dr Jaynee Eagles note. Gave [facility] phone number if she has further questions.

## 2016-01-02 NOTE — Telephone Encounter (Signed)
-----   Message from Melvenia Beam, MD sent at 01/02/2016  9:49 AM EDT ----- Labs normal

## 2016-01-14 ENCOUNTER — Other Ambulatory Visit: Payer: Self-pay | Admitting: Neurology

## 2016-01-16 ENCOUNTER — Ambulatory Visit: Payer: Self-pay | Admitting: Family

## 2016-01-18 ENCOUNTER — Ambulatory Visit
Admission: RE | Admit: 2016-01-18 | Discharge: 2016-01-18 | Disposition: A | Discharge status: Home / Self Care | Payer: PRIVATE HEALTH INSURANCE | Source: Ambulatory Visit | Attending: Neurology | Admitting: Neurology

## 2016-01-18 DIAGNOSIS — R404 Transient alteration of awareness: Secondary | ICD-10-CM

## 2016-01-18 DIAGNOSIS — F05 Delirium due to known physiological condition: Secondary | ICD-10-CM | POA: Diagnosis not present

## 2016-01-18 DIAGNOSIS — R410 Disorientation, unspecified: Secondary | ICD-10-CM

## 2016-01-18 MED ORDER — GADOBENATE DIMEGLUMINE 529 MG/ML IV SOLN
15.0000 mL | Freq: Once | INTRAVENOUS | Status: AC | PRN
  Administered 2016-01-18: 15 mL via INTRAVENOUS

## 2016-01-20 ENCOUNTER — Telehealth: Payer: Self-pay | Admitting: *Deleted

## 2016-01-20 MED FILL — TROKENDI XR 100 MG CAPSULE: 100 | 30 days supply | Qty: 30 | Fill #0

## 2016-01-20 MED FILL — TROKENDI XR 50 MG CAPSULE: 50 | 30 days supply | Qty: 30 | Fill #0

## 2016-01-20 NOTE — Telephone Encounter (Signed)
-----   Message from Melvenia Beam, MD sent at 01/19/2016  6:44 PM EDT ----- MRI shows Prior suboccipital decompression surgery for Chiari malformation. The brain is otherwise normal and there are no acute findings.

## 2016-01-20 NOTE — Telephone Encounter (Signed)
Called and spoke to pt about MRI results per Dr Jaynee Eagles note. Pt verbalized understanding and has no further questions at this time.

## 2016-01-29 ENCOUNTER — Ambulatory Visit (INDEPENDENT_AMBULATORY_CARE_PROVIDER_SITE_OTHER): Payer: PRIVATE HEALTH INSURANCE | Admitting: Neurology

## 2016-01-29 ENCOUNTER — Telehealth: Payer: Self-pay | Admitting: *Deleted

## 2016-01-29 DIAGNOSIS — R55 Syncope and collapse: Secondary | ICD-10-CM

## 2016-01-29 DIAGNOSIS — R410 Disorientation, unspecified: Secondary | ICD-10-CM

## 2016-01-29 DIAGNOSIS — R404 Transient alteration of awareness: Secondary | ICD-10-CM

## 2016-01-29 DIAGNOSIS — F05 Delirium due to known physiological condition: Secondary | ICD-10-CM

## 2016-01-29 NOTE — Procedures (Signed)
    History:  Kim Pham is a 35 year old patient with a history of headache and syncope. She has a history of Chiari malformation, status post decompression surgery. She has had some worsening of her headaches recently, she is having episodes of "zoning out", losing track of time. The patient is being evaluated for these episodes.  This is a sleep deprived EEG. No skull defects are noted. Medications include melatonin, Ultram, Trokendi, and Tylenol.   EEG classification: Normal awake and asleep  Description of the recording: The background rhythms of this recording consists of a fairly well modulated medium amplitude background activity of 9 Hz. As the record progresses, the patient initially is in the waking state, but appears to enter the early stage II sleep during the recording, with rudimentary sleep spindles and vertex sharp wave activity seen. As the patient enters the drowsy state, occasional 1 second bursts of generalized 6 Hz activity is seen. During the wakeful state, photic stimulation is performed, and this results in a bilateral and symmetric photic driving response. Hyperventilation was also performed, and this results in a minimal buildup of the background rhythm activities without significant slowing seen. At no time during the recording does there appear to be evidence of spike or spike wave discharges or evidence of focal slowing. EKG monitor shows no evidence of cardiac rhythm abnormalities with a heart rate of 54.  Impression: This is a normal EEG recording in the waking and sleeping state. No evidence of ictal or interictal discharges were seen at any time during the recording. Bursts of theta slowing while entering the drowsy state likely represent hypnagogic hypersynchrony.

## 2016-01-29 NOTE — Telephone Encounter (Signed)
Tried calling pt. Unable to LVM. Will try again tomorrow.

## 2016-01-29 NOTE — Telephone Encounter (Signed)
-----   Message from Melvenia Beam, MD sent at 01/29/2016  4:48 PM EDT ----- EEG is normal, can you ask if patient had an episode while on eeg? If not we may need a 3-day ambulatory eeg if she is still willing let me know thanks

## 2016-01-30 NOTE — Telephone Encounter (Signed)
LVM for pt to call back Monday to discuss. LVM with results per Dr Jaynee Eagles.

## 2016-02-09 NOTE — Telephone Encounter (Signed)
Called and LVM for pt to call. Advised EEG normal again and asked her to call back if she is ok with proceeding with 3-day in home EEG if no event occurred during EEG.

## 2016-02-10 NOTE — Telephone Encounter (Signed)
Pt called office back. I relayed results per Dr Jaynee Eagles note. She verbalized understanding and agreeable to have 3-day in home EEG. Advised they will contact her to schedule. [facility] will get insurance approval as well. She verbalized understanding.

## 2016-02-10 NOTE — Telephone Encounter (Signed)
Faxed completed form to [facility] to schedule pt for in-home 72-hour EEG. Fax: [phone]. Received confirmation.   

## 2016-02-11 ENCOUNTER — Telehealth: Payer: Self-pay | Admitting: Neurology

## 2016-02-11 NOTE — Telephone Encounter (Signed)
Re-faxed order to include 72-hr for length of EEG, office note and EEG results as requested. Received fax confirmation.

## 2016-02-11 NOTE — Telephone Encounter (Signed)
Tammy/[facility] [phone] called to advise the order needs length of monitoring, cc of last OV and cc of EEG

## 2016-02-18 MED FILL — TROKENDI XR 50 MG CAPSULE: 50 | 30 days supply | Qty: 30 | Fill #1

## 2016-02-18 MED FILL — TROKENDI XR 100 MG CAPSULE: 100 | 30 days supply | Qty: 30 | Fill #1

## 2016-02-24 ENCOUNTER — Telehealth: Payer: Self-pay | Admitting: Neurology

## 2016-02-24 NOTE — Telephone Encounter (Signed)
Called and LVM for pt advising per AA,MD that she needs to call and schedule appt with nurse practicioner to be evaluated for symptoms. There are openings this week.  *Please schedule pt if she calls back

## 2016-02-24 NOTE — Telephone Encounter (Signed)
Pt called back. appt scheduled with Hoyle Sauer tomorrow-11/8 at 8:15

## 2016-02-24 NOTE — Telephone Encounter (Signed)
Noted, thank you

## 2016-02-24 NOTE — Telephone Encounter (Signed)
Patient is calling stating she has been experiencing numbness and pain on the left side of her face radiating to her left ear for the past 2 days. She has an appointment with Dr. Jaynee Eagles this Thursday and needs some advice as to what to do. 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

## 2016-02-25 ENCOUNTER — Encounter: Payer: Self-pay | Admitting: Nurse Practitioner

## 2016-02-25 ENCOUNTER — Ambulatory Visit (INDEPENDENT_AMBULATORY_CARE_PROVIDER_SITE_OTHER): Payer: PRIVATE HEALTH INSURANCE | Admitting: Nurse Practitioner

## 2016-02-25 VITALS — BP 104/66 | HR 76 | Ht 69.0 in | Wt 161.0 lb

## 2016-02-25 DIAGNOSIS — R404 Transient alteration of awareness: Secondary | ICD-10-CM

## 2016-02-25 DIAGNOSIS — R202 Paresthesia of skin: Secondary | ICD-10-CM | POA: Insufficient documentation

## 2016-02-25 DIAGNOSIS — R200 Anesthesia of skin: Secondary | ICD-10-CM

## 2016-02-25 HISTORY — DX: Anesthesia of skin: R20.2

## 2016-02-25 HISTORY — DX: Anesthesia of skin: R20.0

## 2016-02-25 MED ORDER — ALPRAZOLAM 0.5 MG PO TABS: 0.5000 mg | ORAL_TABLET | Freq: Once | ORAL | 0 refills | Status: AC

## 2016-02-25 NOTE — Progress Notes (Addendum)
[facility]  PATIENT: Kim Pham DOB: 1980-10-23   REASON FOR VISIT: Follow-up for history of headache, alteration of awareness, new left sided facial numbness and left arm and hand numbness HISTORY FROM: Patient and husband    HISTORY OF PRESENT ILLNESS:UPDATE 02/25/16 CM. Kim Pham, 35 year old returns for follow up . Early Monday morning 2 days ago, she had left sided head pain and numbness around the left eye over to the ear.She went on to work but had trouble focusing.  She  went to bed early . She awoke around 4am yesterday with pain on the left side and numbness. She now has numbness in left hand and arm. She does not have weakness. No other associated symptoms. She claims her migraines are in good control since being on Topamax.     HISTORY AARachel AVERLEIGH Pham is a 35 y.o. female here as a referral from Dr. Inda Castle for headache and syncopal episodes.PMHx migraines and chiari malformation s/p decompression surgery.  In 2005 she was "randomly passing out" and having headaches and she was finding herself on the floor. A mild chiari malformation of 69mm was found and she had decompression surgery which helped. This past June her headaches started changing and worsening. Migraines usually on the right side but lately she has intense debilitating pain on the top of her head. 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 She does not know where she is when these episodes resolve. She does urinate.defecate, she works at a nursing home. This past week she has had 3 of them but usually 1-2x a week at work and at home. She gets disoriented when driving and she is losing time driving. She is losing words. No  triggers at work, no significant stress. Sometimes the episodes start with a headache sometimes they do not. She feels anxious before it comes on, she feels jittery. She is having the headaches almost every day. She cannot have children and she had  A partial hysterectomy. Father with migraines. PGF with seizures. No other focal neurologic deficits or complaints, associates symptoms or modifying factors.     REVIEW OF SYSTEMS: Full 14 system review of systems performed and notable only for those listed, all others are neg:  Constitutional: chills, fatigue  Cardiovascular: neg Ear/Nose/Throat: neg  Skin: neg Eyes: light sensitivity Respiratory: neg Gastroitestinal: neg  Hematology/Lymphatic: neg  Endocrine: intolerance to cold Musculoskeletal:neg Allergy/Immunology: neg Neurological: memory loss, numbness, hx of migraines Psychiatric: anxiety Sleep : neg   ALLERGIES: No Known Allergies  HOME MEDICATIONS: Outpatient Medications Prior to Visit  Medication Sig Dispense Refill  . acetaminophen (TYLENOL) 500 MG tablet Take 1,000 mg by mouth every 6 (six) hours as needed. For pain      . Topiramate ER (TROKENDI XR) 100 MG CP24 Take 50mg  and 100mg  at night for a total of 150mg  30 capsule 12  . Topiramate ER 50 MG CP24 Take 50mg  and 100mg  at night for a total of 150mg  30 capsule 12  . traMADol (ULTRAM) 50 MG tablet Take by mouth every 8 (eight) hours as needed.    . vitamin B-12 (CYANOCOBALAMIN) 500 MCG tablet Take 500 mcg by  mouth daily.    Marland Kitchen ALPRAZolam (XANAX) 0.5 MG tablet Take 1-2 tablets 30 min prior to MRI. Can take additional tablet at time of test if needed. Do not drive. Must have driver. Can cause drowsiness. (Patient not taking: Reported on 02/25/2016) 3 tablet 0  . Melatonin 10 MG CAPS Take by mouth.     No facility-administered medications prior to visit.     PAST MEDICAL HISTORY: Past Medical History:  Diagnosis Date  . Anxiety   . Chiari malformation   . Endometriosis    . GERD (gastroesophageal reflux disease)    tums otc, prevacid prn  . Headache   . Low blood pressure    pt states history of low blood pressure-fainted 12/2010-instructed to move slowly    PAST SURGICAL HISTORY: Past Surgical History:  Procedure Laterality Date  . ABDOMINAL HYSTERECTOMY  2007  . CYSTOSCOPY  2007  . DIAGNOSTIC LAPAROSCOPY  2005  . ROTATOR CUFF REPAIR  2010   right shoulder  . SALPINGOOPHORECTOMY  04/16/2011   Procedure: SALPINGO OOPHERECTOMY;  Surgeon: Lovenia Kim, MD;  Location: [facility];  Service: Gynecology;  Laterality: Right;  . skull decompression surgery 2005      FAMILY HISTORY: Family History  Problem Relation Age of Onset  . Hypertension Mother   . CAD Mother   . Breast cancer Mother   . Migraines Mother   . Hypertension Father   . CAD Father   . CVA Father     SOCIAL HISTORY: Social History   Social History  . Marital status: Married    Spouse name: N/A  . Number of children: 1  . Years of education: Assoc   Occupational History  . nurse    Social History Main Topics  . Smoking status: Former Smoker    Quit date: 04/04/2004  . Smokeless tobacco: Never Used  . Alcohol use No  . Drug use: No  . Sexual activity: Not on file   Other Topics Concern  . Not on file   Social History Narrative   Works as an Therapist, sports at [employer]   Married   1 son- born 2004 has autism   Enjoys exercise, special olympics with son   Drinks 4-5 cups of coffee a day      PHYSICAL EXAM  Vitals:   02/25/16 0806  BP: 104/66  Pulse: 76  Weight: 161 lb (73 kg)  Height: 5\' 9"  (1.753 m)   Body mass index is 23.78 kg/m.  Generalized: Well developed, in no acute distress  Head: normocephalic and atraumatic,. Oropharynx benign  Neck: Supple, no carotid bruits  Cardiac: Regular rate rhythm, no murmur  Musculoskeletal: No deformity   Neurological examination   Mentation: Alert oriented to time, place, history taking. Attention span and  concentration appropriate. Recent and remote memory intact.  Follows all commands speech and language fluent.   Cranial nerve II-XII: Pupils were equal round reactive to light extraocular movements were full, visual field were full on confrontational test. Facial sensation and strength were normal. hearing was intact to finger rubbing bilaterally. Uvula tongue midline. head turning and shoulder shrug were normal and symmetric.Tongue protrusion into cheek strength was normal. Motor: normal bulk and tone, full strength in the BUE, BLE, fine finger movements normal, no pronator drift. No focal weakness Sensory: normal and symmetric to light touch, pinprick, and  Vibration, on the right, diminished to decrease modalities on the left Coordination: finger-nose-finger, heel-to-shin bilaterally, no dysmetria Reflexes: Brachioradialis 2/2, biceps 2/2,  triceps 2/2, patellar 2/2, Achilles 2/2, plantar responses were flexor bilaterally. Gait and Station: Rising up from seated position without assistance, normal stance,  moderate stride, good arm swing, smooth turning, able to perform tiptoe, and heel walking without difficulty. Tandem gait is mildly unsteady  DIAGNOSTIC DATA (LABS, IMAGING, TESTING) - I reviewed patient records, labs, notes, testing and imaging myself where available.  Lab Results  Component Value Date   WBC 4.6 01/01/2016   HGB 13.0 01/11/2015   HCT 41.7 01/01/2016   MCV 89 01/01/2016   PLT 280 01/01/2016      Component Value Date/Time   NA 143 01/01/2016 0841   K 5.2 01/01/2016 0841   CL 102 01/01/2016 0841   CO2 23 01/01/2016 0841   GLUCOSE 91 01/01/2016 0841   GLUCOSE 96 01/11/2015 1927   BUN 12 01/01/2016 0841   CREATININE 0.65 01/01/2016 0841   CALCIUM 9.8 01/01/2016 0841   PROT 7.5 01/01/2016 0841   ALBUMIN 4.6 01/01/2016 0841   AST 21 01/01/2016 0841   ALT 20 01/01/2016 0841   ALKPHOS 53 01/01/2016 0841   BILITOT 0.3 01/01/2016 0841   GFRNONAA 115 01/01/2016 0841    GFRAA 133 01/01/2016 0841    ASSESSMENT AND PLAN 35 year old with episodes of altered awareness, need to evaluate for focal onset seizures. She also has a PMHx of migraines. New onset left facial pain and numbness, and numbness of the left arm and hand.Non organic neuro exam   Discussed with Dr. Jaynee Eagles MRI of the brain without contrast, for new onset left facial pain and numbness and numbness of left hand and arm Given RX  Xanax for procedure Schedule prolonged EEG already ordered F/U 3 to 4  Months with Dr. Jaynee Eagles Vst time [place], [facility], [facility], APRN  [facility] [address], [place], [place] [address] [phone]  Personally  participated in and made any corrections needed to history, physical, neuro exam,assessment and plan as stated above.  I have personally obtained the history, evaluated lab date, reviewed imaging studies and agree with radiology interpretations.    Sarina Ill, MD

## 2016-02-25 NOTE — Patient Instructions (Signed)
MRI of the brain without contrast Schedule prolonged EEG F/U 3 to 4  months

## 2016-02-26 ENCOUNTER — Ambulatory Visit: Payer: PRIVATE HEALTH INSURANCE | Admitting: Neurology

## 2016-03-08 ENCOUNTER — Encounter: Payer: Self-pay | Admitting: Family

## 2016-03-08 ENCOUNTER — Ambulatory Visit (INDEPENDENT_AMBULATORY_CARE_PROVIDER_SITE_OTHER): Payer: PRIVATE HEALTH INSURANCE | Admitting: Family

## 2016-03-08 ENCOUNTER — Telehealth: Payer: Self-pay | Admitting: Family

## 2016-03-08 VITALS — BP 122/77 | HR 82 | Temp 98.3°F | Resp 18 | Ht 69.0 in | Wt 159.0 lb

## 2016-03-08 DIAGNOSIS — R5383 Other fatigue: Secondary | ICD-10-CM

## 2016-03-08 DIAGNOSIS — R509 Fever, unspecified: Secondary | ICD-10-CM | POA: Diagnosis not present

## 2016-03-08 DIAGNOSIS — K921 Melena: Secondary | ICD-10-CM

## 2016-03-08 DIAGNOSIS — R0602 Shortness of breath: Secondary | ICD-10-CM

## 2016-03-08 DIAGNOSIS — R7989 Other specified abnormal findings of blood chemistry: Secondary | ICD-10-CM

## 2016-03-08 DIAGNOSIS — R0600 Dyspnea, unspecified: Secondary | ICD-10-CM

## 2016-03-08 LAB — CBC WITH DIFFERENTIAL/PLATELET
Basophils Absolute: 0 10*3/uL (ref 0.0–0.1)
Basophils Relative: 0.9 % (ref 0.0–3.0)
Eosinophils Absolute: 0.1 10*3/uL (ref 0.0–0.7)
Eosinophils Relative: 1.2 % (ref 0.0–5.0)
HCT: 39.6 % (ref 36.0–46.0)
Hemoglobin: 13.3 g/dL (ref 12.0–15.0)
Lymphocytes Relative: 35.4 % (ref 12.0–46.0)
Lymphs Abs: 1.5 10*3/uL (ref 0.7–4.0)
MCHC: 33.6 g/dL (ref 30.0–36.0)
MCV: 86 fl (ref 78.0–100.0)
Monocytes Absolute: 0.2 10*3/uL (ref 0.1–1.0)
Monocytes Relative: 5.5 % (ref 3.0–12.0)
Neutro Abs: 2.5 10*3/uL (ref 1.4–7.7)
Neutrophils Relative %: 57 % (ref 43.0–77.0)
Platelets: 240 10*3/uL (ref 150.0–400.0)
RBC: 4.6 Mil/uL (ref 3.87–5.11)
RDW: 14.1 % (ref 11.5–15.5)
WBC: 4.4 10*3/uL (ref 4.0–10.5)

## 2016-03-08 LAB — BASIC METABOLIC PANEL
BUN: 8 mg/dL (ref 6–23)
CO2: 25 mEq/L (ref 19–32)
Calcium: 9.7 mg/dL (ref 8.4–10.5)
Chloride: 108 mEq/L (ref 96–112)
Creatinine, Ser: 0.72 mg/dL (ref 0.40–1.20)
GFR: 97.47 mL/min (ref >60.00)
Glucose, Bld: 88 mg/dL (ref 70–99)
Potassium: 4 mEq/L (ref 3.5–5.1)
Sodium: 140 mEq/L (ref 135–145)

## 2016-03-08 LAB — D-DIMER, QUANTITATIVE: D-Dimer, Quant: 0.51 mcg/mL FEU — ABNORMAL HIGH (ref <0.50)

## 2016-03-08 LAB — HEPATIC FUNCTION PANEL
ALT: 20 U/L (ref 0–35)
AST: 17 U/L (ref 0–37)
Albumin: 4.6 g/dL (ref 3.5–5.2)
Alkaline Phosphatase: 48 U/L (ref 39–117)
Bilirubin, Direct: 0 mg/dL (ref 0.0–0.3)
Total Bilirubin: 0.4 mg/dL (ref 0.2–1.2)
Total Protein: 7.1 g/dL (ref 6.0–8.3)

## 2016-03-08 LAB — TSH: TSH: 1.45 u[IU]/mL (ref 0.35–4.50)

## 2016-03-08 LAB — POCT HEMOGLOBIN: Hemoglobin: 14.6 g/dL (ref 12.2–16.2)

## 2016-03-08 LAB — T3, FREE: T3, Free: 2.8 pg/mL (ref 2.3–4.2)

## 2016-03-08 LAB — SEDIMENTATION RATE: Sed Rate: 2 mm/hr (ref 0–20)

## 2016-03-08 LAB — T4, FREE: Free T4: 0.82 ng/dL (ref 0.60–1.60)

## 2016-03-08 NOTE — Telephone Encounter (Signed)
Attempted to reach pt by phone and left message to check mychart acct for further instructions.

## 2016-03-08 NOTE — Telephone Encounter (Signed)
D dimer is mildly elevated. I would recommend that patient proceed with a CTA today please to rule out PE.

## 2016-03-08 NOTE — Progress Notes (Signed)
Pre visit review using our clinic review tool, if applicable. No additional management support is needed unless otherwise documented below in the visit note. 

## 2016-03-08 NOTE — Telephone Encounter (Signed)
Pt has read mychart message but has not responded. Left detailed message at home # to call and let us know what time she could be here today for CTA.

## 2016-03-08 NOTE — Progress Notes (Signed)
Subjective:    Patient ID: Kim Pham, female    DOB: Dec 28, 1980, 35 y.o.   MRN: [id number]  HPI  Kim Pham is a 35 yr old female who presents today with chief complaint of fatigue and weight loss.  She reports extreme fatigue for 6 weeks. She also reports intermittent fever 99-102, with associated generalized weakness.  Has noted + intermittent blood in stool (began 2 weeks ago, maroon in color- "mixed in with the stool" has some mild abdominal discomfort when this occurs. Unintentional weight loss over 6-8 weeks.  Reports SOB even with walking down the hall at work.  Finds herself having to sit down in the shower when shampooing her hair due to fatigue.  She reports + family hx of SLE and RA.    Wt Readings from Last 3 Encounters:  03/08/16 159 lb (72.1 kg)  02/25/16 161 lb (73 kg)  01/01/16 171 lb (77.6 kg)    Reports Tmax 102- has not had other symptoms. Denies dysuria.  Has had some intermittent lymph node swelling. Has tried otc motrin/tylenol and symptoms resolved.  No recent travel. Last fever was 4 days ago. She does report some elbow and knee pain.  Reports neg PPD this year at work. Denies risk factors for HIV.    Neurology is evaluating her numbness/tingling and she is scheduled for an MRI this coming Saturday per neurology.  Review of Systems    see HPI  Past Medical History:  Diagnosis Date  . Anxiety   . Chiari malformation   . Endometriosis   . GERD (gastroesophageal reflux disease)    tums otc, prevacid prn  . Headache   . Low blood pressure    pt states history of low blood pressure-fainted 12/2010-instructed to move slowly     Social History   Social History  . Marital status: Married    Spouse name: N/A  . Number of children: 1  . Years of education: Assoc   Occupational History  . nurse    Social History Main Topics  . Smoking status: Former Smoker    Quit date: 04/04/2004  . Smokeless tobacco: Never Used  . Alcohol use No  . Drug use:  No  . Sexual activity: Not on file   Other Topics Concern  . Not on file   Social History Narrative   Works as an Therapist, sports at [employer]   Married   1 son- born 2004 has autism   Enjoys exercise, special olympics with son   Drinks 4-5 cups of coffee a day     Past Surgical History:  Procedure Laterality Date  . ABDOMINAL HYSTERECTOMY  2007  . CYSTOSCOPY  2007  . DIAGNOSTIC LAPAROSCOPY  2005  . ROTATOR CUFF REPAIR  2010   right shoulder  . SALPINGOOPHORECTOMY  04/16/2011   Procedure: SALPINGO OOPHERECTOMY;  Surgeon: Lovenia Kim, MD;  Location: [facility];  Service: Gynecology;  Laterality: Right;  . skull decompression surgery 2005      Family History  Problem Relation Age of Onset  . Hypertension Mother   . CAD Mother   . Breast cancer Mother   . Migraines Mother   . Hypertension Father   . CAD Father   . CVA Father     No Known Allergies  Current Outpatient Prescriptions on File Prior to Visit  Medication Sig Dispense Refill  . acetaminophen (TYLENOL) 500 MG tablet Take 1,000 mg by mouth every 6 (six) hours as needed. For  pain      . Topiramate ER (TROKENDI XR) 100 MG CP24 Take 37m and 104mat night for a total of 15080m0 capsule 12  . Topiramate ER 50 MG CP24 Take 82m55md 100mg55mnight for a total of 182mg 49mapsule 12  . traMADol (ULTRAM) 50 MG tablet Take by mouth every 8 (eight) hours as needed.    . vitamin B-12 (CYANOCOBALAMIN) 500 MCG tablet Take 500 mcg by mouth daily.     No current facility-administered medications on file prior to visit.     BP 122/77 (BP Location: Right Arm, Cuff Size: Normal)   Pulse 82   Temp 98.3 F (36.8 C) (Oral)   Resp 18   Ht _0  (1.753 m)   Wt 159 lb (72.1 kg)   SpO2 100% Comment: room air  BMI 23.48 kg/m    Objective:   Physical Exam  Constitutional: She is oriented to person, place, and time. She appears well-developed and well-nourished.  HENT:  Head: Normocephalic and atraumatic.  Neck: No  thyromegaly present.  Cardiovascular: Normal rate, regular rhythm and normal heart sounds.   No murmur heard. Pulmonary/Chest: Effort normal and breath sounds normal. No respiratory distress. She has no wheezes.  Abdominal: Soft. She exhibits no distension. There is no tenderness.  Genitourinary: Rectal exam shows guaiac negative stool.  Genitourinary Comments: Normal rectal exam  Lymphadenopathy:    She has no cervical adenopathy.  Neurological: She is alert and oriented to person, place, and time.  Skin: Skin is warm and dry.  Psychiatric: She has a normal mood and affect. Her behavior is normal. Judgment and thought content normal.          Assessment & Plan:  Fatigue- Need to rule out autoimmune or infectious etiology.  Obtain ANA, RA, ESR, TFT's, lyme and RMSF testing.  CBC to assess WBC/platelets.    Blood in stool- refer to GI for further evaluation.  Hgb is 14 today on fingerstick. She is advised to go to the ER if she develops multiple stools in a row with blood.   Dyspnea- will obtain D dimer to rule out PE.  If elevated, plan CTA. 2D echo today. EKG tracing is personally reviewed.  EKG notes NSR.  No acute changes.   Will defer work up of her numbness to neurology.

## 2016-03-08 NOTE — Patient Instructions (Addendum)
Please complete lab work prior to leaving. You will be contacted about your referral to GI to evaluate your rectal bleeding and about your echocardiogram. Please go to the ER if you develop worsening shortness of breath or if you develop multiple bloody stools in a row.

## 2016-03-09 ENCOUNTER — Encounter (HOSPITAL_BASED_OUTPATIENT_CLINIC_OR_DEPARTMENT_OTHER): Payer: Self-pay

## 2016-03-09 ENCOUNTER — Ambulatory Visit (HOSPITAL_BASED_OUTPATIENT_CLINIC_OR_DEPARTMENT_OTHER)
Admission: RE | Admit: 2016-03-09 | Discharge: 2016-03-09 | Disposition: A | Discharge status: Home / Self Care | Payer: PRIVATE HEALTH INSURANCE | Source: Ambulatory Visit | Attending: Family | Admitting: Family

## 2016-03-09 ENCOUNTER — Encounter: Payer: Self-pay | Admitting: Family

## 2016-03-09 DIAGNOSIS — R7989 Other specified abnormal findings of blood chemistry: Secondary | ICD-10-CM

## 2016-03-09 DIAGNOSIS — R0602 Shortness of breath: Secondary | ICD-10-CM | POA: Diagnosis present

## 2016-03-09 LAB — LYME AB/WESTERN BLOT REFLEX: B burgdorferi Ab IgG+IgM: <0.9 Index (ref <0.90)

## 2016-03-09 LAB — ANTI-NUCLEAR AB-TITER (ANA TITER): ANA Titer 1: 1:40 {titer} — ABNORMAL HIGH

## 2016-03-09 LAB — ANA: Anti Nuclear Antibody(ANA): POSITIVE — AB

## 2016-03-09 LAB — RHEUMATOID FACTOR: Rhuematoid fact SerPl-aCnc: <14 IU/mL (ref <14)

## 2016-03-09 MED ORDER — IOPAMIDOL (ISOVUE-300) INJECTION 61%: 100.0000 mL | Freq: Once | INTRAVENOUS | Status: DC | PRN

## 2016-03-09 MED ORDER — IOPAMIDOL (ISOVUE-370) INJECTION 76%
100.0000 mL | Freq: Once | INTRAVENOUS | Status: AC | PRN
  Administered 2016-03-09: 100 mL via INTRAVENOUS

## 2016-03-12 LAB — ROCKY MTN SPOTTED FVR ABS PNL(IGG+IGM)
RMSF IgG: NOT DETECTED
RMSF IgM: NOT DETECTED

## 2016-03-13 ENCOUNTER — Other Ambulatory Visit: Payer: Self-pay

## 2016-03-14 ENCOUNTER — Encounter: Payer: Self-pay | Admitting: Family

## 2016-03-14 DIAGNOSIS — R768 Other specified abnormal immunological findings in serum: Secondary | ICD-10-CM

## 2016-03-16 NOTE — Telephone Encounter (Signed)
Delsa Sale-- please see below request for Dr Estanislado Pandy and send referral to her. Thank you!

## 2016-03-16 NOTE — Telephone Encounter (Signed)
Patient is requesting to see Rheumatologist at Dr. Estanislado Pandy with [facility] patient use to work for her and perfers her rather than [facility]    Her # is [phone]

## 2016-03-18 ENCOUNTER — Telehealth: Payer: Self-pay | Admitting: Family

## 2016-03-18 ENCOUNTER — Ambulatory Visit (HOSPITAL_BASED_OUTPATIENT_CLINIC_OR_DEPARTMENT_OTHER): Admission: RE | Admit: 2016-03-18 | Payer: PRIVATE HEALTH INSURANCE | Source: Ambulatory Visit

## 2016-03-18 NOTE — Telephone Encounter (Signed)
Call from imaging. Pt was no show for Echo this morning at [facility]. LM for pt to see if she would like to reschedule.

## 2016-03-19 MED FILL — TROKENDI XR 100 MG CAPSULE: 100 | 30 days supply | Qty: 30 | Fill #2

## 2016-03-19 MED FILL — TROKENDI XR 50 MG CAPSULE: 50 | 30 days supply | Qty: 30 | Fill #2

## 2016-03-19 NOTE — Telephone Encounter (Signed)
Noted  

## 2016-03-22 NOTE — Telephone Encounter (Signed)
Patients new patient visit was accepted last week. 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

## 2016-03-23 NOTE — Telephone Encounter (Signed)
LMOM for patient to call back to schedule new patient appointment.

## 2016-03-26 ENCOUNTER — Encounter: Payer: Self-pay | Admitting: Family

## 2016-03-26 ENCOUNTER — Ambulatory Visit (INDEPENDENT_AMBULATORY_CARE_PROVIDER_SITE_OTHER): Payer: PRIVATE HEALTH INSURANCE | Admitting: Family

## 2016-03-26 VITALS — BP 103/63 | HR 68 | Temp 98.2°F | Resp 16 | Ht 69.0 in | Wt 157.8 lb

## 2016-03-26 DIAGNOSIS — F329 Major depressive disorder, single episode, unspecified: Secondary | ICD-10-CM | POA: Diagnosis not present

## 2016-03-26 DIAGNOSIS — F418 Other specified anxiety disorders: Secondary | ICD-10-CM | POA: Insufficient documentation

## 2016-03-26 DIAGNOSIS — K1370 Unspecified lesions of oral mucosa: Secondary | ICD-10-CM | POA: Diagnosis not present

## 2016-03-26 DIAGNOSIS — R5383 Other fatigue: Secondary | ICD-10-CM | POA: Insufficient documentation

## 2016-03-26 DIAGNOSIS — M329 Systemic lupus erythematosus, unspecified: Secondary | ICD-10-CM

## 2016-03-26 DIAGNOSIS — R768 Other specified abnormal immunological findings in serum: Secondary | ICD-10-CM | POA: Diagnosis not present

## 2016-03-26 DIAGNOSIS — F32A Depression, unspecified: Secondary | ICD-10-CM

## 2016-03-26 HISTORY — DX: Other fatigue: R53.83

## 2016-03-26 HISTORY — DX: Other specified anxiety disorders: F41.8

## 2016-03-26 HISTORY — DX: Systemic lupus erythematosus, unspecified: M32.9

## 2016-03-26 MED ORDER — ESCITALOPRAM OXALATE 10 MG PO TABS: ORAL_TABLET | ORAL | 0 refills | Status: DC

## 2016-03-26 MED FILL — ESCITALOPRAM 10 MG TABLET: 10 | 30 days supply | Qty: 30 | Fill #0

## 2016-03-26 NOTE — Assessment & Plan Note (Signed)
Patient scored 11 on PHQ-9. We discussed a trial of lexapro 10mg .   I instructed pt to start 1/2 tablet once daily for 1 week and then increase to a full tablet once daily on week two as tolerated.  We discussed common side effects such as nausea, drowsiness and weight gain. Pt verbalizes understanding.  Plan follow up in 1 month to evaluate progress.

## 2016-03-26 NOTE — Progress Notes (Signed)
Pre visit review using our clinic review tool, if applicable. No additional management support is needed unless otherwise documented below in the visit note. 

## 2016-03-26 NOTE — Patient Instructions (Signed)
Please begin lexapro 10mg  1/2 tab once daily for 1 week, then increase to a full tab once daily on week two.

## 2016-03-26 NOTE — Assessment & Plan Note (Addendum)
Advised pt to keep her upcoming appointment with Dr. Estanislado Pandy.  Lupus is certainly a consideration in explaining her symptoms.

## 2016-03-26 NOTE — Progress Notes (Signed)
Subjective:    Patient ID: Kim Pham, female    DOB: 21-Apr-1980, 35 y.o.   MRN: [id number]  HPI  Kim Pham is a 35 yr old female who presents today for follow up.  1) fatigue- last visit ANA was noted to be positive.  RMSF was negative. CBC, TFT's, lyme testing, LFT, BMET were WNL. D dimer was elevated prompting a CTA which was unremarkable.  Reports + Joint pain, anorexia.  Had a fever last week, no fever this week.  2) weight loss-  Wt Readings from Last 3 Encounters:  03/26/16 157 lb 12.8 oz (71.6 kg)  03/08/16 159 lb (72.1 kg)  02/25/16 161 lb (73 kg)   3) Mouth Lesions- notes lesions in her mouth and sore in her nose x 4 days.    Review of Systems See HPI  Past Medical History:  Diagnosis Date  . Anxiety   . Chiari malformation   . Endometriosis   . GERD (gastroesophageal reflux disease)    tums otc, prevacid prn  . Headache   . Low blood pressure    pt states history of low blood pressure-fainted 12/2010-instructed to move slowly     Social History   Social History  . Marital status: Married    Spouse name: N/A  . Number of children: 1  . Years of education: Assoc   Occupational History  . nurse    Social History Main Topics  . Smoking status: Former Smoker    Quit date: 04/04/2004  . Smokeless tobacco: Never Used  . Alcohol use No  . Drug use: No  . Sexual activity: Not on file   Other Topics Concern  . Not on file   Social History Narrative   Works as an Therapist, sports at [employer]   Married   1 son- born 2004 has autism   Enjoys exercise, special olympics with son   Drinks 4-5 cups of coffee a day     Past Surgical History:  Procedure Laterality Date  . ABDOMINAL HYSTERECTOMY  2007  . CYSTOSCOPY  2007  . DIAGNOSTIC LAPAROSCOPY  2005  . ROTATOR CUFF REPAIR  2010   right shoulder  . SALPINGOOPHORECTOMY  04/16/2011   Procedure: SALPINGO OOPHERECTOMY;  Surgeon: Lovenia Kim, MD;  Location: [facility];  Service: Gynecology;   Laterality: Right;  . skull decompression surgery 2005      Family History  Problem Relation Age of Onset  . Hypertension Mother   . CAD Mother   . Breast cancer Mother   . Migraines Mother   . Hypertension Father   . CAD Father   . CVA Father     No Known Allergies  Current Outpatient Prescriptions on File Prior to Visit  Medication Sig Dispense Refill  . acetaminophen (TYLENOL) 500 MG tablet Take 1,000 mg by mouth every 6 (six) hours as needed. For pain      . Topiramate ER (TROKENDI XR) 100 MG CP24 Take 50mg  and 100mg  at night for a total of 150mg  30 capsule 12  . Topiramate ER 50 MG CP24 Take 50mg  and 100mg  at night for a total of 150mg  30 capsule 12  . vitamin B-12 (CYANOCOBALAMIN) 500 MCG tablet Take 500 mcg by mouth daily.     No current facility-administered medications on file prior to visit.     BP 103/63 (BP Location: Right Arm, Cuff Size: Normal)   Pulse 68   Temp 98.2 F (36.8 C) (Oral)   Resp  16   Ht 5\' 9"  (1.753 m)   Wt 157 lb 12.8 oz (71.6 kg)   SpO2 100% Comment: room air  BMI 23.30 kg/m       Objective:   Physical Exam  Constitutional: She is oriented to person, place, and time. She appears well-developed and well-nourished.  HENT:  Head: Normocephalic and atraumatic.  Small mouth lesion noted inside lower lip and left cheek  Cardiovascular: Normal rate, regular rhythm and normal heart sounds.   No murmur heard. Pulmonary/Chest: Effort normal and breath sounds normal. No respiratory distress. She has no wheezes.  Neurological: She is alert and oriented to person, place, and time.  Psychiatric: She has a normal mood and affect. Her behavior is normal. Judgment and thought content normal.          Assessment & Plan:  Mouth lesions- resolving, we discussed rx with Valtrex but she declines since symptoms are improving.

## 2016-04-16 ENCOUNTER — Telehealth: Payer: Self-pay | Admitting: Neurology

## 2016-04-16 NOTE — Telephone Encounter (Signed)
Generic ok for now. -VRP

## 2016-04-16 NOTE — Telephone Encounter (Addendum)
Per Dr Leta Baptist, left VM at [facility] [place] with prescription details re: generic topiramate 50mg  tabs (take 50mg  in AM and 100mg  in PM), take by mouth, disp one month supply, no refills. Advised this office is now closed.  Called pt to inform. Left detailed VM, advised office reopens next Wed, left number.

## 2016-04-16 NOTE — Telephone Encounter (Signed)
Patient calling in reference to Topiramate ER (TROKENDI XR) 100 MG CP24 andTopiramate ER 50 MG CP24.  Patient will be receiving new insurance end of January and the cost for these prescriptions are $600.  Patient is wanting to know if we are able to call in generic 25mg  with a cost of $9. Please call

## 2016-04-23 ENCOUNTER — Ambulatory Visit: Payer: Self-pay | Admitting: Family

## 2016-05-10 ENCOUNTER — Encounter: Payer: Self-pay | Admitting: Family

## 2016-05-14 NOTE — Progress Notes (Deleted)
Office Visit Note  Patient: Kim Pham             Date of Birth: 1980-10-17           MRN: [id number]             PCP: Nance Pear., NP Referring: Debbrah Alar, NP Visit Date: 05/17/2016 Occupation: '@GUAROCC'$ @    Subjective:  No chief complaint on file.   History of Present Illness: Kim Pham is a 36 y.o. female ***   Activities of Daily Living:  Patient reports morning stiffness for *** {minute/hour:19697}.   Patient {ACTIONS;DENIES/REPORTS:21021675::"Denies"} nocturnal pain.  Difficulty dressing/grooming: {ACTIONS;DENIES/REPORTS:21021675::"Denies"} Difficulty climbing stairs: {ACTIONS;DENIES/REPORTS:21021675::"Denies"} Difficulty getting out of chair: {ACTIONS;DENIES/REPORTS:21021675::"Denies"} Difficulty using hands for taps, buttons, cutlery, and/or writing: {ACTIONS;DENIES/REPORTS:21021675::"Denies"}   No Rheumatology ROS completed.   PMFS History:  Patient Active Problem List   Diagnosis Date Noted  . Fatigue 03/26/2016  . ANA positive 03/26/2016  . Depression 03/26/2016  . Numbness and tingling 02/25/2016  . Transient alteration of awareness 01/01/2016  . Anxiety state 12/19/2015  . Chiari malformation 12/19/2015    Past Medical History:  Diagnosis Date  . Anxiety   . Chiari malformation   . Endometriosis   . GERD (gastroesophageal reflux disease)    tums otc, prevacid prn  . Headache   . Low blood pressure    pt states history of low blood pressure-fainted 12/2010-instructed to move slowly    Family History  Problem Relation Age of Onset  . Hypertension Mother   . CAD Mother   . Breast cancer Mother   . Migraines Mother   . Hypertension Father   . CAD Father   . CVA Father    Past Surgical History:  Procedure Laterality Date  . ABDOMINAL HYSTERECTOMY  2007  . CYSTOSCOPY  2007  . DIAGNOSTIC LAPAROSCOPY  2005  . ROTATOR CUFF REPAIR  2010   right shoulder  . SALPINGOOPHORECTOMY  04/16/2011   Procedure: SALPINGO  OOPHERECTOMY;  Surgeon: Lovenia Kim, MD;  Location: [facility];  Service: Gynecology;  Laterality: Right;  . skull decompression surgery 2005     Social History   Social History Narrative   Works as an Therapist, sports at [employer]   Married   1 son- born 2004 has autism   Enjoys exercise, special olympics with son   Drinks 4-5 cups of coffee a day      Objective: Vital Signs: There were no vitals taken for this visit.   Physical Exam   Musculoskeletal Exam: ***  CDAI Exam: No CDAI exam completed.    Investigation: Findings:  03/08/2016 CBC normal, CMP normal, TSH normal, ESR 2, RMSF negative, Lyme negative, RF negative, ANA 1:40 homogeneous    Imaging: No results found.  Speciality Comments: No specialty comments available.    Procedures:  No procedures performed Allergies: Patient has no known allergies.   Assessment / Plan:     Visit Diagnoses: No diagnosis found.    Orders: No orders of the defined types were placed in this encounter.  No orders of the defined types were placed in this encounter.   Face-to-face time spent with patient was *** minutes. 50% of time was spent in counseling and coordination of care.  Follow-Up Instructions: No Follow-up on file.   Bo Merino, MD  Note - This record has been created using Editor, commissioning.  Chart creation errors have been sought, but may not always  have been located. Such creation errors do not  reflect on  the standard of medical care.

## 2016-05-17 ENCOUNTER — Encounter: Payer: Self-pay | Admitting: Family

## 2016-05-17 ENCOUNTER — Ambulatory Visit: Payer: Self-pay | Admitting: Physician Assistant

## 2016-05-17 ENCOUNTER — Ambulatory Visit (INDEPENDENT_AMBULATORY_CARE_PROVIDER_SITE_OTHER): Payer: Self-pay | Admitting: Family Medicine

## 2016-05-17 ENCOUNTER — Ambulatory Visit: Payer: PRIVATE HEALTH INSURANCE | Admitting: Rheumatology

## 2016-05-17 ENCOUNTER — Encounter: Payer: Self-pay | Admitting: Family Medicine

## 2016-05-17 VITALS — BP 104/67 | HR 68 | Temp 98.6°F | Resp 18 | Wt 161.0 lb

## 2016-05-17 DIAGNOSIS — J0100 Acute maxillary sinusitis, unspecified: Secondary | ICD-10-CM

## 2016-05-17 MED ORDER — AMOXICILLIN 500 MG PO CAPS: 500.0000 mg | ORAL_CAPSULE | Freq: Three times a day (TID) | ORAL | 0 refills | Status: DC

## 2016-05-17 NOTE — Progress Notes (Signed)
Kim Pham , 08/08/1980, 36 y.o., female MRN: [id number] Patient Care Team    Relationship Specialty Notifications Start End  Debbrah Alar, NP PCP - General Internal Medicine  12/19/15     CC: flu positive Subjective: Pt presents for an acute OV with complaints of  Influenza positive test. Pt feels symptoms are worse and not improving. Pt has tried tylenol, resting and hydration. Pt has a + flu test at her place of employment on Friday and sent home. She works a Forensic psychologist. She endorses not having insurance and did not take tamiflu. She has had fever, chills, headache, fatigue,  myalgia, cough and shortness of breath. She denies h/o asthma. Endorses recent h/o lupus diagnosis.   No Known Allergies Social History  Substance Use Topics  . Smoking status: Former Smoker    Quit date: 04/04/2004  . Smokeless tobacco: Never Used  . Alcohol use No   Past Medical History:  Diagnosis Date  . Anxiety   . Chiari malformation   . Endometriosis   . GERD (gastroesophageal reflux disease)    tums otc, prevacid prn  . Headache   . Low blood pressure    pt states history of low blood pressure-fainted 12/2010-instructed to move slowly   Past Surgical History:  Procedure Laterality Date  . ABDOMINAL HYSTERECTOMY  2007  . CYSTOSCOPY  2007  . DIAGNOSTIC LAPAROSCOPY  2005  . ROTATOR CUFF REPAIR  2010   right shoulder  . SALPINGOOPHORECTOMY  04/16/2011   Procedure: SALPINGO OOPHERECTOMY;  Surgeon: Lovenia Kim, MD;  Location: [facility];  Service: Gynecology;  Laterality: Right;  . skull decompression surgery 2005     Family History  Problem Relation Age of Onset  . Hypertension Mother   . CAD Mother   . Breast cancer Mother   . Migraines Mother   . Hypertension Father   . CAD Father   . CVA Father    Allergies as of 05/17/2016   No Known Allergies     Medication List       Accurate as of 05/17/16 11:10 AM. Always use your most recent med list.            acetaminophen 500 MG tablet Commonly known as:  TYLENOL Take 1,000 mg by mouth every 6 (six) hours as needed. For pain   escitalopram 10 MG tablet Commonly known as:  LEXAPRO 1/2 tablet by mouth once daily at bedtime x 1 week, then increase to a full tab once daily on week two.   Topiramate ER 100 MG Cp24 Commonly known as:  TROKENDI XR Take 50mg  and 100mg  at night for a total of 150mg    Topiramate ER 50 MG Cp24 Take 50mg  and 100mg  at night for a total of 150mg    vitamin B-12 500 MCG tablet Commonly known as:  CYANOCOBALAMIN Take 500 mcg by mouth daily.       No results found for this or any previous visit (from the past 24 hour(s)). No results found.   ROS: Negative, with the exception of above mentioned in HPI   Objective:  BP 104/67 (BP Location: Left Arm, Patient Position: Sitting, Cuff Size: Normal)   Pulse 68   Temp 98.6 F (37 C)   Resp 18   Wt 161 lb (73 kg)   SpO2 98%   BMI 23.78 kg/m  Body mass index is 23.78 kg/m. Gen: Afebrile. No acute distress. Nontoxic in appearance, well developed, well nourished.  HENT: AT. [place].  Bilateral TM visualized wnl. MMM, no oral lesions. Bilateral nares with erythema and drainaeg. Throat without erythema or exudates. Cough, TTP max sinus.  Eyes:Pupils Equal Round Reactive to light, Extraocular movements intact,  Conjunctiva without redness, discharge or icterus. Neck/lymp/endocrine: Supple,no lymphadenopathy CV: RRR Chest: CTAB, no wheeze or crackles. Good air movement, normal resp effort.  Abd: Soft. NTND. BS present.   Assessment/Plan: Kim Pham is a 36 y.o. female present for acute OV for  Acute maxillary sinusitis, recurrence not specified - rest, hydrate. Mucinex.  - work excuse for 2 days.  - amoxicillin (AMOXIL) 500 MG capsule; Take 1 capsule (500 mg total) by mouth 3 (three) times daily.  Dispense: 30 capsule; Refill: 0 - f/u as needed.    electronically signed by:  Howard Pouch, DO  [place]

## 2016-05-17 NOTE — Patient Instructions (Signed)
Rest. Hydrate.  Mucinex.  Amox every 8 hours for 10 days.  I believe the influenza, has now given you a sinus infection.    Sinusitis, Adult Sinusitis is soreness and inflammation of your sinuses. Sinuses are hollow spaces in the bones around your face. They are located:  Around your eyes.  In the middle of your forehead.  Behind your nose.  In your cheekbones. Your sinuses and nasal passages are lined with a stringy fluid (mucus). Mucus normally drains out of your sinuses. When your nasal tissues get inflamed or swollen, the mucus can get trapped or blocked so air cannot flow through your sinuses. This lets bacteria, viruses, and funguses grow, and that leads to infection. Follow these instructions at home: Medicines  Take, use, or apply over-the-counter and prescription medicines only as told by your doctor. These may include nasal sprays.  If you were prescribed an antibiotic medicine, take it as told by your doctor. Do not stop taking the antibiotic even if you start to feel better. Hydrate and Humidify  Drink enough water to keep your pee (urine) clear or pale yellow.  Use a cool mist humidifier to keep the humidity level in your home above 50%.  Breathe in steam for 10-15 minutes, 3-4 times a day or as told by your doctor. You can do this in the bathroom while a hot shower is running.  Try not to spend time in cool or dry air. Rest  Rest as much as possible.  Sleep with your head raised (elevated).  Make sure to get enough sleep each night. General instructions  Put a warm, moist washcloth on your face 3-4 times a day or as told by your doctor. This will help with discomfort.  Wash your hands often with soap and water. If there is no soap and water, use hand sanitizer.  Do not smoke. Avoid being around people who are smoking (secondhand smoke).  Keep all follow-up visits as told by your doctor. This is important. Contact a doctor if:  You have a fever.  Your  symptoms get worse.  Your symptoms do not get better within 10 days. Get help right away if:  You have a very bad headache.  You cannot stop throwing up (vomiting).  You have pain or swelling around your face or eyes.  You have trouble seeing.  You feel confused.  Your neck is stiff.  You have trouble breathing. This information is not intended to replace advice given to you by your health care provider. Make sure you discuss any questions you have with your health care provider. Document Released: 09/22/2007 Document Revised: 11/30/2015 Document Reviewed: 01/29/2015 Elsevier Interactive Patient Education  2017 [employer].

## 2016-06-02 ENCOUNTER — Ambulatory Visit: Payer: PRIVATE HEALTH INSURANCE | Admitting: Neurology

## 2016-06-04 ENCOUNTER — Encounter: Payer: Self-pay | Admitting: Neurology

## 2016-06-16 ENCOUNTER — Ambulatory Visit: Payer: PRIVATE HEALTH INSURANCE | Admitting: Rheumatology

## 2016-07-21 ENCOUNTER — Telehealth: Payer: Self-pay | Admitting: Diagnostic Neuroimaging

## 2016-07-22 MED ORDER — TOPIRAMATE ER 50 MG PO CAP24: ORAL_CAPSULE | ORAL | 0 refills | Status: DC

## 2016-07-22 MED ORDER — TOPIRAMATE 50 MG PO TABS: ORAL_TABLET | ORAL | 0 refills | Status: DC

## 2016-07-22 NOTE — Telephone Encounter (Signed)
E-scribed rx to pt's pharmacy. Must keep May appt for ongoing refills.

## 2016-07-22 NOTE — Telephone Encounter (Signed)
Patient is requesting refill for topiramate 50mg .  Patient scheduled to see Dr. Jaynee Eagles 08/31/16.

## 2016-07-22 NOTE — Telephone Encounter (Signed)
Received fax from pharmacy stating that pt has not used ER topiramate (despite being listed in EPIC) but only IR med. Appears that rx for IR topiramate was called in to pharmacy at the end of Dec. New rx e-scribed for IR to get pt through to her next appt in May.

## 2016-07-22 NOTE — Addendum Note (Signed)
Addended by: Monte Fantasia on: 07/22/2016 11:38 AM   Modules accepted: Orders

## 2016-07-22 NOTE — Addendum Note (Signed)
Addended by: Monte Fantasia on: 07/22/2016 03:13 PM   Modules accepted: Orders

## 2016-08-16 ENCOUNTER — Encounter: Payer: Self-pay | Admitting: Family

## 2016-08-16 ENCOUNTER — Ambulatory Visit (INDEPENDENT_AMBULATORY_CARE_PROVIDER_SITE_OTHER): Payer: Self-pay | Admitting: Family

## 2016-08-16 VITALS — BP 119/69 | HR 70 | Temp 98.4°F | Resp 19 | Ht 69.0 in | Wt 150.8 lb

## 2016-08-16 DIAGNOSIS — R634 Abnormal weight loss: Secondary | ICD-10-CM

## 2016-08-16 DIAGNOSIS — F32A Depression, unspecified: Secondary | ICD-10-CM

## 2016-08-16 DIAGNOSIS — K625 Hemorrhage of anus and rectum: Secondary | ICD-10-CM

## 2016-08-16 DIAGNOSIS — R0602 Shortness of breath: Secondary | ICD-10-CM | POA: Diagnosis not present

## 2016-08-16 DIAGNOSIS — R768 Other specified abnormal immunological findings in serum: Secondary | ICD-10-CM

## 2016-08-16 DIAGNOSIS — F329 Major depressive disorder, single episode, unspecified: Secondary | ICD-10-CM | POA: Diagnosis not present

## 2016-08-16 LAB — CBC WITH DIFFERENTIAL/PLATELET
Basophils Absolute: 0 10*3/uL (ref 0.0–0.1)
Basophils Relative: 0.7 % (ref 0.0–3.0)
Eosinophils Absolute: 0.1 10*3/uL (ref 0.0–0.7)
Eosinophils Relative: 1.6 % (ref 0.0–5.0)
HCT: 39.9 % (ref 36.0–46.0)
Hemoglobin: 13.1 g/dL (ref 12.0–15.0)
Lymphocytes Relative: 28.7 % (ref 12.0–46.0)
Lymphs Abs: 1.7 10*3/uL (ref 0.7–4.0)
MCHC: 32.9 g/dL (ref 30.0–36.0)
MCV: 86.8 fl (ref 78.0–100.0)
Monocytes Absolute: 0.4 10*3/uL (ref 0.1–1.0)
Monocytes Relative: 6.3 % (ref 3.0–12.0)
Neutro Abs: 3.7 10*3/uL (ref 1.4–7.7)
Neutrophils Relative %: 62.7 % (ref 43.0–77.0)
Platelets: 274 10*3/uL (ref 150.0–400.0)
RBC: 4.59 Mil/uL (ref 3.87–5.11)
RDW: 13.7 % (ref 11.5–15.5)
WBC: 5.8 10*3/uL (ref 4.0–10.5)

## 2016-08-16 MED ORDER — PANTOPRAZOLE SODIUM 40 MG PO TBEC: 40.0000 mg | DELAYED_RELEASE_TABLET | Freq: Every day | ORAL | 3 refills | Status: DC

## 2016-08-16 NOTE — Patient Instructions (Signed)
Please complete lab work prior to leaving. Keep your upcoming appointment with Dr. Estanislado Pandy. Call if mood worsens.

## 2016-08-16 NOTE — Assessment & Plan Note (Signed)
I suspect that she may have an autoimmune issue which could explain her multiple somatic complaints. I have advised her to keep her upcoming appointment with Dr. Estanislado Pandy.

## 2016-08-16 NOTE — Progress Notes (Signed)
Subjective:    Patient ID: Pham Pham, female    DOB: October 25, 1980, 36 y.o.   MRN: [id number]  HPI  Pham Pham is a 36 yr old female who presents today for follow up.  Depression- she was last seen in December. She scored 11 on PHQ-9.  She was placed on lexapro.  Reports that she experienced insomnia and GI upset on lexapro so she discontinued. Reports that her mood   Weight Loss- has had further weight loss since last visit. Reports that her hair continues to thin, having issues with ongoing fatigue, joint pains and mouth sores. Lab work back in November noted Normal thyroid function, lft, cbc, Lyme test/RMSF, ANA was positive and D dimer was positive. CTA chest neg for PE. ESR WNL, RA testing negative.  She has upcoming appointment with Pham Pham in a few weeks.  Wt Readings from Last 3 Encounters:  08/16/16 150 lb 12.8 oz (68.4 kg)  05/17/16 161 lb (73 kg)  03/26/16 157 lb 12.8 oz (71.6 kg)   She is also following with neurology for her migraines.   Reports intermittent blood in stool. Having issues with reflux, vomiting more.  Has had some dark blood in vomit- streaks.  Using otc zantac HS.   Notes occasional SOB with activity, even as simple as "washing my hair." did not follow through with gi referral or echo due to lapse in insurance. She has now obtained insurance.  Review of Systems See HPI  Past Medical History:  Diagnosis Date  . Anxiety   . Chiari malformation   . Endometriosis   . GERD (gastroesophageal reflux disease)    tums otc, prevacid prn  . Headache   . Low blood pressure    pt states history of low blood pressure-fainted 12/2010-instructed to move slowly     Social History   Social History  . Marital status: Married    Spouse name: N/A  . Number of children: 1  . Years of education: Assoc   Occupational History  . nurse    Social History Main Topics  . Smoking status: Former Smoker    Quit date: 04/04/2004  . Smokeless tobacco: Never  Used  . Alcohol use No  . Drug use: No  . Sexual activity: Not on file   Other Topics Concern  . Not on file   Social History Narrative   Works as an Therapist, sports at [employer]   Married   1 son- born 2004 has autism   Enjoys exercise, special olympics with son   Drinks 4-5 cups of coffee a day     Past Surgical History:  Procedure Laterality Date  . ABDOMINAL HYSTERECTOMY  2007  . CYSTOSCOPY  2007  . DIAGNOSTIC LAPAROSCOPY  2005  . ROTATOR CUFF REPAIR  2010   right shoulder  . SALPINGOOPHORECTOMY  04/16/2011   Procedure: SALPINGO OOPHERECTOMY;  Surgeon: Pham Kim, MD;  Location: [facility];  Service: Gynecology;  Laterality: Right;  . skull decompression surgery 2005      Family History  Problem Relation Age of Onset  . Hypertension Mother   . CAD Mother   . Breast cancer Mother   . Migraines Mother   . Hypertension Father   . CAD Father   . CVA Father     Allergies  Allergen Reactions  . Lexapro [Escitalopram Oxalate]     Insomnia, stomach upset    Current Outpatient Prescriptions on File Prior to Visit  Medication Sig Dispense  Refill  . topiramate (TOPAMAX) 50 MG tablet Take 50 mg In the am and 100 mg at night for a total of 150 mg daily. 90 tablet 0   No current facility-administered medications on file prior to visit.     BP 119/69 (BP Location: Right Arm, Cuff Size: Normal)   Pulse 70   Temp 98.4 F (36.9 C) (Oral)   Resp 19   Ht 5' 9"  (1.753 m)   Wt 150 lb 12.8 oz (68.4 kg)   SpO2 100% Comment: room air  BMI 22.27 kg/m       Objective:   Physical Exam  Constitutional: She appears well-developed and well-nourished.  HENT:  Head: Normocephalic and atraumatic.  Cardiovascular: Normal rate, regular rhythm and normal heart sounds.   No murmur heard. Pulmonary/Chest: Effort normal and breath sounds normal. No respiratory distress. She has no wheezes.  Psychiatric: She has a normal mood and affect. Her behavior is normal. Judgment and thought  content normal.          Assessment & Plan:  GERD/Rectal bleeding- d/c zantac otc, begin protonix.  Will re-initiate referral to GI.   Weight loss- she reports weight loss has been stable recently.   SOB- obtain 2D echo.

## 2016-08-16 NOTE — Assessment & Plan Note (Signed)
Stable. Symptoms may be more due to her autoimmune issues. We did discuss that if she wishes to retry an antidepressant to let me know and we could try an SNRI.

## 2016-08-16 NOTE — Progress Notes (Signed)
Pre visit review using our clinic review tool, if applicable. No additional management support is needed unless otherwise documented below in the visit note. 

## 2016-08-18 DIAGNOSIS — M2550 Pain in unspecified joint: Secondary | ICD-10-CM

## 2016-08-18 HISTORY — DX: Pain in unspecified joint: M25.50

## 2016-08-18 NOTE — Progress Notes (Signed)
Office Visit Note  Patient: Kim Pham             Date of Birth: 12-14-1980           MRN: [id number]             PCP: Debbrah Alar, NP Referring: Debbrah Alar, NP Visit Date: 08/23/2016 Occupation: _0 @    Subjective:  Fatigue and joint pain   History of Present Illness: Kim Pham is a 36 y.o. female seen in consultation per request of her PCP. According to patient in summer of 2017 she started developing oral ulcers, hair loss, fatigue, joint swelling which she describes in her elbow joints, wrist joints and ankle joints. She went to see her PCP in [place] who did lab work and was diagnosed with mild anemia. She states gradually the oral ulcers got worse and covered all of her oral mucosa. She was also having nasal ulcers. She states her fatigue got worse she tried B12 by mouth without much help. She also had decreased appetite and increased headaches. She was seen by Dr. Jaynee Eagles the neurologist, who diagnosed her with migraines versus focal seizures in September 2017. She was started on Topamax 50 mg a.m. and 100 mg p.m. She states since then she has lost 47 pounds. Her fatigue persists. She also has low-grade fevers with highest temperature of 101 per patient. She continues to have oral ulcers nasal ulcers joint swelling and weight loss. She states she had some repeat labs by her PCP and her ANA was positive at 1:40. She also has positive family history of rheumatoid arthritis in maternal grandmother, gout in her mother and Sjogren's and her sister.  Activities of Daily Living:  Patient reports morning stiffness for 30 minutes.   Patient Denies nocturnal pain.  Difficulty dressing/grooming: Denies Difficulty climbing stairs: Reports Difficulty getting out of chair: Reports Difficulty using hands for taps, buttons, cutlery, and/or writing: Reports   Review of Systems  Constitutional: Positive for fatigue and weight loss. Negative for night sweats,  weight gain and weakness.       On Topamax  HENT: Positive for mouth sores. Negative for trouble swallowing, trouble swallowing, mouth dryness and nose dryness.        Nasal ulcers  Eyes: Negative for pain, redness, visual disturbance and dryness.  Respiratory: Positive for shortness of breath. Negative for cough and difficulty breathing.   Cardiovascular: Negative for chest pain, palpitations, hypertension, irregular heartbeat and swelling in legs/feet.  Gastrointestinal: Negative for blood in stool, constipation and diarrhea.  Endocrine: Negative for increased urination.  Genitourinary: Negative for vaginal dryness.  Musculoskeletal: Positive for arthralgias, joint pain, joint swelling, myalgias, morning stiffness and myalgias. Negative for muscle weakness and muscle tenderness.  Skin: Positive for hair loss. Negative for color change, rash, skin tightness, ulcers and sensitivity to sunlight.  Allergic/Immunologic: Negative for susceptible to infections.  Neurological: Positive for dizziness and headaches. Negative for memory loss and night sweats.  Hematological: Positive for swollen glands.  Psychiatric/Behavioral: Positive for sleep disturbance. Negative for depressed mood. The patient is not nervous/anxious.     PMFS History:  Patient Active Problem List   Diagnosis Date Noted  . Pain in joint, multiple sites 08/18/2016  . Fatigue 03/26/2016  . ANA positive 03/26/2016  . Depression 03/26/2016  . Numbness and tingling 02/25/2016  . Transient alteration of awareness 01/01/2016  . Anxiety state 12/19/2015  . Chiari malformation 12/19/2015    Past Medical History:  Diagnosis Date  .  Anxiety   . Chiari malformation   . Endometriosis   . GERD (gastroesophageal reflux disease)    tums otc, prevacid prn  . Headache   . Low blood pressure    pt states history of low blood pressure-fainted 12/2010-instructed to move slowly    Family History  Problem Relation Age of Onset  .  Hypertension Mother   . CAD Mother   . Breast cancer Mother   . Migraines Mother   . Gout Mother   . Hypertension Father   . CAD Father   . CVA Father   . Sjogren's syndrome Sister   . Rheum arthritis Maternal Grandmother    Past Surgical History:  Procedure Laterality Date  . ABDOMINAL HYSTERECTOMY  2007  . CYSTOSCOPY  2007  . DIAGNOSTIC LAPAROSCOPY  2005  . ROTATOR CUFF REPAIR  2010   right shoulder  . SALPINGOOPHORECTOMY  04/16/2011   Procedure: SALPINGO OOPHERECTOMY;  Surgeon: Lovenia Kim, MD;  Location: [facility];  Service: Gynecology;  Laterality: Right;  . skull decompression surgery 2005     Social History   Social History Narrative   Works as an Therapist, sports at [employer]   Married   1 son- born 2004 has autism   Enjoys exercise, special olympics with son   Drinks 4-5 cups of coffee a day      Objective: Vital Signs: BP (!) 96/57 (BP Location: Left Arm, Patient Position: Sitting, Cuff Size: Normal)   Pulse 75   Resp 12   Ht '5\' 9"'$  (1.753 m)   Wt 159 lb (72.1 kg)   BMI 23.48 kg/m    Physical Exam  Constitutional: She is oriented to person, place, and time. She appears well-developed and well-nourished.  HENT:  Head: Normocephalic and atraumatic.  Eyes: Conjunctivae and EOM are normal.  Neck: Normal range of motion.  Cardiovascular: Normal rate, regular rhythm, normal heart sounds and intact distal pulses.   Pulmonary/Chest: Effort normal and breath sounds normal.  Abdominal: Soft. Bowel sounds are normal.  Lymphadenopathy:    She has no cervical adenopathy.  Neurological: She is alert and oriented to person, place, and time.  Skin: Skin is warm and dry. Capillary refill takes less than 2 seconds.  Psychiatric: She has a normal mood and affect. Her behavior is normal.  Nursing note and vitals reviewed.    Musculoskeletal Exam: C-spine and thoracic lumbar spine good range of motion. She is some discomfort range of motion of her lumbar spine. Shoulder  joints elbow joints wrist joint MCPs PIPs DIPs with good range of motion with no synovitis. Hip joints knee joints ankles MTPs PIPs with good range of motion with no synovitis.  CDAI Exam: CDAI Homunculus Exam:   Joint Counts:  CDAI Tender Joint count: 0 CDAI Swollen Joint count: 0     Investigation: Findings:   November 2017  Normal ESR, RF normal, TSH normal , LFT normal ,CBC normal, Lyme test/RMSF normal neg , ANA was positive low titer 1:40 and D dimer was positive (0.51).   03/09/2016 CTA shows No demonstrable pulmonary embolus. Lungs clear. No evident adenopathy.      Imaging: Xr Foot 2 Views Left  Result Date: 08/23/2016 MTPs, PIPs DIPs were within normal limits without any erosive changes no intertarsal joint space changes were noted. Impression normal x-ray of the foot  Xr Foot 2 Views Right  Result Date: 08/23/2016 MTPs, PIPs DIPs were within normal limits without any erosive changes no intertarsal joint space changes  were noted. Impression normal x-ray of the foot  Xr Hand 2 View Left  Result Date: 08/23/2016 All MCP PIP/DIP joints were within normal limits no intercarpal joint space narrowing was noted. No erosive changes were noted. Impression: Normal x-ray of the hand  Xr Hand 2 View Right  Result Date: 08/23/2016 All MCP PIP/DIP joints were within normal limits no intercarpal joint space narrowing was noted. No erosive changes were noted. Impression: Normal x-ray of the hand   Speciality Comments: No specialty comments available.    Procedures:  No procedures performed Allergies: Lexapro [escitalopram oxalate]   Assessment / Plan:     Visit Diagnoses: ANA positive, patient gives history of oral ulcers, nasal ulcers, hair loss, fatigue is positive history of Sjogren's and her sister. She also gives history of joint pain and swelling. I do not see any synovitis on examination today. She had no oral ulcers on examination today. - Plan: Urinalysis, Routine w  reflex microscopic, ANA, C3 and C4, [id number] ENA PANEL  Pain in joint, multiple sites - Plan: Sedimentation rate, Gliadin antibodies, serum, Tissue transglutaminase, IgA. She gives history of intermittent diarrhea output hadn't 2 workup for celiac disease as well.  Other fatigue - History of weight loss, hair loss, oral ulcers, arthralgias - Plan: COMPLETE METABOLIC PANEL WITH GFR, CK  History of anxiety and depression  Pain in both hands - Plan: XR Hand 2 View Right, XR Hand 2 View Left, x-rays were within normal limits. Rheumatoid factor, Cyclic citrul peptide antibody, IgG  Pain in both feet - Plan: XR Foot 2 Views Right, XR Foot 2 Views Left. X-rays obtained today were within normal limits  Recurrent oral ulcers - Plan: Pan-ANCA    Orders: Orders Placed This Encounter  Procedures  . XR Hand 2 View Right  . XR Hand 2 View Left  . XR Foot 2 Views Right  . XR Foot 2 Views Left  . COMPLETE METABOLIC PANEL WITH GFR  . Urinalysis, Routine w reflex microscopic  . Sedimentation rate  . CK  . ANA  . C3 and C4  . [id number] ENA PANEL  . Gliadin antibodies, serum  . Tissue transglutaminase, IgA  . Pan-ANCA  . Rheumatoid factor  . Cyclic citrul peptide antibody, IgG   No orders of the defined types were placed in this encounter.   Face-to-face time spent with patient was  50 minutes. 50% of time was spent in counseling and coordination of care.  Follow-Up Instructions: Return for Polyarthralgia, positive ANA.   Bo Merino, MD  Note - This record has been created using Editor, commissioning.  Chart creation errors have been sought, but may not always  have been located. Such creation errors do not reflect on  the standard of medical care.

## 2016-08-23 ENCOUNTER — Encounter: Payer: Self-pay | Admitting: Family

## 2016-08-23 ENCOUNTER — Ambulatory Visit (INDEPENDENT_AMBULATORY_CARE_PROVIDER_SITE_OTHER): Payer: BLUE CROSS/BLUE SHIELD | Admitting: Rheumatology

## 2016-08-23 ENCOUNTER — Ambulatory Visit (INDEPENDENT_AMBULATORY_CARE_PROVIDER_SITE_OTHER): Payer: BLUE CROSS/BLUE SHIELD

## 2016-08-23 ENCOUNTER — Ambulatory Visit (INDEPENDENT_AMBULATORY_CARE_PROVIDER_SITE_OTHER): Payer: Self-pay

## 2016-08-23 ENCOUNTER — Encounter: Payer: Self-pay | Admitting: Rheumatology

## 2016-08-23 VITALS — BP 96/57 | HR 75 | Resp 12 | Ht 69.0 in | Wt 159.0 lb

## 2016-08-23 DIAGNOSIS — Z8659 Personal history of other mental and behavioral disorders: Secondary | ICD-10-CM

## 2016-08-23 DIAGNOSIS — R5383 Other fatigue: Secondary | ICD-10-CM | POA: Diagnosis not present

## 2016-08-23 DIAGNOSIS — M2550 Pain in unspecified joint: Secondary | ICD-10-CM | POA: Diagnosis not present

## 2016-08-23 DIAGNOSIS — M79672 Pain in left foot: Secondary | ICD-10-CM | POA: Diagnosis not present

## 2016-08-23 DIAGNOSIS — R768 Other specified abnormal immunological findings in serum: Secondary | ICD-10-CM | POA: Diagnosis not present

## 2016-08-23 DIAGNOSIS — K1379 Other lesions of oral mucosa: Secondary | ICD-10-CM

## 2016-08-23 DIAGNOSIS — M79671 Pain in right foot: Secondary | ICD-10-CM | POA: Diagnosis not present

## 2016-08-23 DIAGNOSIS — M79642 Pain in left hand: Secondary | ICD-10-CM

## 2016-08-23 DIAGNOSIS — M79641 Pain in right hand: Secondary | ICD-10-CM | POA: Diagnosis not present

## 2016-08-23 LAB — COMPLETE METABOLIC PANEL WITH GFR
ALT: 18 U/L (ref 6–29)
AST: 19 U/L (ref 10–30)
Albumin: 4.5 g/dL (ref 3.6–5.1)
Alkaline Phosphatase: 62 U/L (ref 33–115)
BUN: 9 mg/dL (ref 7–25)
CO2: 20 mmol/L (ref 20–31)
Calcium: 9.4 mg/dL (ref 8.6–10.2)
Chloride: 109 mmol/L (ref 98–110)
Creat: 0.72 mg/dL (ref 0.50–1.10)
GFR, Est African American: >89 mL/min (ref >=60)
GFR, Est Non African American: >89 mL/min (ref >=60)
Glucose, Bld: 88 mg/dL (ref 65–99)
Potassium: 4.1 mmol/L (ref 3.5–5.3)
Sodium: 141 mmol/L (ref 135–146)
Total Bilirubin: 0.3 mg/dL (ref 0.2–1.2)
Total Protein: 6.9 g/dL (ref 6.1–8.1)

## 2016-08-24 ENCOUNTER — Encounter: Payer: Self-pay | Admitting: Family

## 2016-08-24 LAB — C3 AND C4
C3 Complement: 107 mg/dL (ref 83–193)
C4 Complement: 18 mg/dL (ref 15–57)

## 2016-08-24 LAB — URINALYSIS, ROUTINE W REFLEX MICROSCOPIC
Bilirubin Urine: NEGATIVE
Glucose, UA: NEGATIVE
Hgb urine dipstick: NEGATIVE
Ketones, ur: NEGATIVE
Leukocytes, UA: NEGATIVE
Nitrite: NEGATIVE
Protein, ur: NEGATIVE
Specific Gravity, Urine: 1.008 (ref 1.001–1.035)
pH: 7.5 (ref 5.0–8.0)

## 2016-08-24 LAB — CP5000020 ENA PANEL
ENA SM Ab Ser-aCnc: <1
Ribonucleic Protein(ENA) Antibody, IgG: <1
SSA (Ro) (ENA) Antibody, IgG: <1
SSB (La) (ENA) Antibody, IgG: <1
Scleroderma (Scl-70) (ENA) Antibody, IgG: <1
ds DNA Ab: 1 IU/mL

## 2016-08-24 LAB — CK: Total CK: 36 U/L (ref 7–177)

## 2016-08-24 LAB — CYCLIC CITRUL PEPTIDE ANTIBODY, IGG: Cyclic Citrullin Peptide Ab: <16 Units

## 2016-08-24 LAB — ANA: Anti Nuclear Antibody(ANA): POSITIVE — AB

## 2016-08-24 LAB — RHEUMATOID FACTOR: Rhuematoid fact SerPl-aCnc: <14 IU/mL (ref <14)

## 2016-08-24 LAB — PAN-ANCA
ANCA Screen: NEGATIVE
Myeloperoxidase Abs: <1
Serine Protease 3: <1

## 2016-08-24 LAB — ANTI-NUCLEAR AB-TITER (ANA TITER): ANA Titer 1: 1:320 {titer} — ABNORMAL HIGH

## 2016-08-24 LAB — SEDIMENTATION RATE: Sed Rate: 4 mm/hr (ref 0–20)

## 2016-08-25 ENCOUNTER — Ambulatory Visit (HOSPITAL_BASED_OUTPATIENT_CLINIC_OR_DEPARTMENT_OTHER)
Admission: RE | Admit: 2016-08-25 | Discharge: 2016-08-25 | Disposition: A | Discharge status: Home / Self Care | Payer: BLUE CROSS/BLUE SHIELD | Source: Ambulatory Visit | Attending: Family | Admitting: Family

## 2016-08-25 DIAGNOSIS — M79642 Pain in left hand: Secondary | ICD-10-CM | POA: Diagnosis not present

## 2016-08-25 DIAGNOSIS — I340 Nonrheumatic mitral (valve) insufficiency: Secondary | ICD-10-CM | POA: Diagnosis not present

## 2016-08-25 DIAGNOSIS — R0602 Shortness of breath: Secondary | ICD-10-CM | POA: Diagnosis present

## 2016-08-25 DIAGNOSIS — F329 Major depressive disorder, single episode, unspecified: Secondary | ICD-10-CM | POA: Insufficient documentation

## 2016-08-25 DIAGNOSIS — M79641 Pain in right hand: Secondary | ICD-10-CM | POA: Insufficient documentation

## 2016-08-25 DIAGNOSIS — M79672 Pain in left foot: Secondary | ICD-10-CM | POA: Diagnosis not present

## 2016-08-25 DIAGNOSIS — F419 Anxiety disorder, unspecified: Secondary | ICD-10-CM | POA: Diagnosis not present

## 2016-08-25 DIAGNOSIS — M79671 Pain in right foot: Secondary | ICD-10-CM | POA: Diagnosis not present

## 2016-08-25 NOTE — Progress Notes (Signed)
  Echocardiogram 2D Echocardiogram has been performed.  Kim Pham, Kim Pham 08/25/2016, 9:18 AM

## 2016-08-26 ENCOUNTER — Encounter: Payer: Self-pay | Admitting: Family

## 2016-08-26 LAB — GLIADIN ANTIBODIES, SERUM
Gliadin IgA: 3 Units (ref <20)
Gliadin IgG: 1 Units (ref <20)

## 2016-08-26 LAB — TISSUE TRANSGLUTAMINASE, IGA: Tissue Transglutaminase Ab, IgA: 1 U/mL (ref <4)

## 2016-08-27 NOTE — Progress Notes (Signed)
I tried to call patient but could not reach her. Her labs are unremarkable except for positive ANA. She was having a lot of symptoms of autoimmune disease. If she is interested in trying Plaquenil and wants an earlier appointment I can see on Monday as the last appointment. Otherwise we can wait until the follow-up visit.

## 2016-08-31 ENCOUNTER — Ambulatory Visit (INDEPENDENT_AMBULATORY_CARE_PROVIDER_SITE_OTHER): Payer: BLUE CROSS/BLUE SHIELD | Admitting: Neurology

## 2016-08-31 ENCOUNTER — Encounter: Payer: Self-pay | Admitting: Neurology

## 2016-08-31 VITALS — BP 115/70 | HR 79 | Ht 69.0 in | Wt 158.0 lb

## 2016-08-31 DIAGNOSIS — R404 Transient alteration of awareness: Secondary | ICD-10-CM

## 2016-08-31 DIAGNOSIS — R51 Headache: Secondary | ICD-10-CM

## 2016-08-31 DIAGNOSIS — R569 Unspecified convulsions: Secondary | ICD-10-CM | POA: Diagnosis not present

## 2016-08-31 DIAGNOSIS — R519 Headache, unspecified: Secondary | ICD-10-CM

## 2016-08-31 DIAGNOSIS — R200 Anesthesia of skin: Secondary | ICD-10-CM | POA: Diagnosis not present

## 2016-08-31 MED ORDER — KETOROLAC TROMETHAMINE 60 MG/2ML IM SOLN
60.0000 mg | Freq: Once | INTRAMUSCULAR | Status: AC
  Administered 2016-08-31: 60 mg via INTRAMUSCULAR

## 2016-08-31 MED ORDER — GABAPENTIN 300 MG PO CAPS: 300.0000 mg | ORAL_CAPSULE | Freq: Three times a day (TID) | ORAL | 11 refills | Status: DC

## 2016-08-31 MED ORDER — TOPIRAMATE ER 150 MG PO SPRINKLE CAP24: 150.0000 mg | EXTENDED_RELEASE_CAPSULE | Freq: Every day | ORAL | 11 refills | Status: DC

## 2016-08-31 NOTE — Patient Instructions (Signed)
Remember to drink plenty of fluid, eat healthy meals and do not skip any meals. Try to eat protein with a every meal and eat a healthy snack such as fruit or nuts in between meals. Try to keep a regular sleep-wake schedule and try to exercise daily, particularly in the form of walking, 20-30 minutes a day, if you can.   As far as your medications are concerned, I would like to suggest: Gabapentin 300mg  three times a day, Continue Topiramate  As far as diagnostic testing: MRI brain w/wo contrast, 3-day EEG and refer to Dr. Ernest Haber  I would like to see you back in 12 weeks, sooner if we need to. Please call us with any interim questions, concerns, problems, updates or refill requests.   Our phone number is [phone]. We also have an after hours call service for urgent matters and there is a physician on-call for urgent questions. For any emergencies you know to call 911 or go to the nearest emergency room  Gabapentin capsules or tablets What is this medicine? GABAPENTIN (GA ba pen tin) is used to control partial seizures in adults with epilepsy. It is also used to treat certain types of nerve pain. This medicine may be used for other purposes; ask your health care provider or pharmacist if you have questions. COMMON BRAND NAME(S): Active-PAC with Gabapentin, Gabarone, Neurontin What should I tell my health care provider before I take this medicine? They need to know if you have any of these conditions: -kidney disease -suicidal thoughts, plans, or attempt; a previous suicide attempt by you or a family member -an unusual or allergic reaction to gabapentin, other medicines, foods, dyes, or preservatives -pregnant or trying to get pregnant -breast-feeding How should I use this medicine? Take this medicine by mouth with a glass of water. Follow the directions on the prescription label. You can take it with or without food. If it upsets your stomach, take it with food.Take your medicine at  regular intervals. Do not take it more often than directed. Do not stop taking except on your doctor's advice. If you are directed to break the 600 or 800 mg tablets in half as part of your dose, the extra half tablet should be used for the next dose. If you have not used the extra half tablet within 28 days, it should be thrown away. A special MedGuide will be given to you by the pharmacist with each prescription and refill. Be sure to read this information carefully each time. Talk to your pediatrician regarding the use of this medicine in children. Special care may be needed. Overdosage: If you think you have taken too much of this medicine contact a poison control center or emergency room at once. NOTE: This medicine is only for you. Do not share this medicine with others. What if I miss a dose? If you miss a dose, take it as soon as you can. If it is almost time for your next dose, take only that dose. Do not take double or extra doses. What may interact with this medicine? Do not take this medicine with any of the following medications: -other gabapentin products This medicine may also interact with the following medications: -alcohol -antacids -antihistamines for allergy, cough and cold -certain medicines for anxiety or sleep -certain medicines for depression or psychotic disturbances -homatropine; hydrocodone -naproxen -narcotic medicines (opiates) for pain -phenothiazines like chlorpromazine, mesoridazine, prochlorperazine, thioridazine This list may not describe all possible interactions. Give your health care provider a list  of all the medicines, herbs, non-prescription drugs, or dietary supplements you use. Also tell them if you smoke, drink alcohol, or use illegal drugs. Some items may interact with your medicine. What should I watch for while using this medicine? Visit your doctor or health care professional for regular checks on your progress. You may want to keep a record at  home of how you feel your condition is responding to treatment. You may want to share this information with your doctor or health care professional at each visit. You should contact your doctor or health care professional if your seizures get worse or if you have any new types of seizures. Do not stop taking this medicine or any of your seizure medicines unless instructed by your doctor or health care professional. Stopping your medicine suddenly can increase your seizures or their severity. Wear a medical identification bracelet or chain if you are taking this medicine for seizures, and carry a card that lists all your medications. You may get drowsy, dizzy, or have blurred vision. Do not drive, use machinery, or do anything that needs mental alertness until you know how this medicine affects you. To reduce dizzy or fainting spells, do not sit or stand up quickly, especially if you are an older patient. Alcohol can increase drowsiness and dizziness. Avoid alcoholic drinks. Your mouth may get dry. Chewing sugarless gum or sucking hard candy, and drinking plenty of water will help. The use of this medicine may increase the chance of suicidal thoughts or actions. Pay special attention to how you are responding while on this medicine. Any worsening of mood, or thoughts of suicide or dying should be reported to your health care professional right away. Women who become pregnant while using this medicine may enroll in the [place] Pregnancy Registry by calling [id number]. This registry collects information about the safety of antiepileptic drug use during pregnancy. What side effects may I notice from receiving this medicine? Side effects that you should report to your doctor or health care professional as soon as possible: -allergic reactions like skin rash, itching or hives, swelling of the face, lips, or tongue -worsening of mood, thoughts or actions of suicide or dying Side  effects that usually do not require medical attention (report to your doctor or health care professional if they continue or are bothersome): -constipation -difficulty walking or controlling muscle movements -dizziness -nausea -slurred speech -tiredness -tremors -weight gain This list may not describe all possible side effects. Call your doctor for medical advice about side effects. You may report side effects to FDA at 1-800-FDA-1088. Where should I keep my medicine? Keep out of reach of children. This medicine may cause accidental overdose and death if it taken by other adults, children, or pets. Mix any unused medicine with a substance like cat litter or coffee grounds. Then throw the medicine away in a sealed container like a sealed bag or a coffee can with a lid. Do not use the medicine after the expiration date. Store at room temperature between 15 and 30 degrees C (59 and 86 degrees F). NOTE: This sheet is a summary. It may not cover all possible information. If you have questions about this medicine, talk to your doctor, pharmacist, or health care provider.  2018 Elsevier/Gold Standard (2013-06-01 15:26:50)

## 2016-08-31 NOTE — Progress Notes (Signed)
[facility]    Provider:  Dr Jaynee Eagles Referring Provider: Debbrah Alar, NP Primary Care Physician:  Debbrah Alar, NP  CC:  Headache, passing out, disorientation and twitches  Interval history 08/31/2016: Extended 90 minute EEG sleep deprived was normal. She did not have the 3-day eeg completed or the MRI of the brain completed that I ordered at last appt. She has episodes of altered awareness more frequently now, happening more frequently, varies but often 2-3x a week. She has chronic hypotension, but when the episodes occurs her BP and pulse increase. 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 She is crying in the office today. She should not be driving, discussed. Then after the episodes she gets a horrible headache. Headaches are every other day. She has had this one since Saturday. Topiramate helps with the headaches. Had a Hysterectomy in the past. Decreased appetite on the Topamax. She is having new numbness on the left side of the face, shooting pain, she is having left arm numbness.   HPI:  Kim Pham is a 36 y.o. female here as a referral from Dr. Inda Castle for headache and syncopal episodes.PMHx migraines and chiari malformation s/p decompression surgery.  In 2005 she was "randomly passing out" and having headaches and she was finding herself on the floor. A mild chiari malformation of 59mm was found and she had decompression surgery which helped. This past June her headaches started changing and worsening. Migraines usually on the right side but lately she has intense debilitating pain on the top of her head. 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 She does not know where she is when these episodes resolve. She does urinate.defecate, she works at a nursing home. This past week she has had 3 of them but usually 1-2x a week at work and at home. She gets disoriented when driving and she is losing time driving. She is losing words. No triggers at work, no significant stress. Sometimes the episodes start with a headache sometimes they do not. She feels anxious before it comes on, she feels jittery. She is having the headaches almost every day. She cannot have children and she had  A partial hysterectomy. Father with migraines. PGF with seizures. No other focal neurologic deficits or complaints, associates symptoms or modifying factors.    Headaches: Imitrex  Reviewed notes, labs and imaging from outside physicians, which showed:   Impression EEG prolonged 90 minutes sleep deprived:   This is a sleep deprived EEG. No skull defects are noted. Medications include melatonin, Ultram, Trokendi, and Tylenol.   EEG classification: Normal awake and asleep  Description of the recording: The background rhythms of this recording consists of a fairly well modulated medium amplitude background activity of 9 Hz. As the record progresses, the patient  initially is in the waking state, but appears to enter the early stage II sleep during the recording, with rudimentary sleep spindles and vertex sharp wave activity seen. As the patient enters the drowsy state, occasional 1 second bursts of generalized 6 Hz activity is seen. During the wakeful state, photic stimulation is performed, and this results in a bilateral and symmetric photic driving response.  Hyperventilation was also performed, and this results in a minimal buildup of the background rhythm activities without significant slowing seen. At no time during the recording does there appear to be evidence of spike or spike wave discharges or evidence of focal slowing. EKG monitor shows no evidence of cardiac rhythm abnormalities with a heart rate of 54.  Impression: This is a normal EEG recording in the waking and sleeping state. No evidence of ictal or interictal discharges were seen at any time during the recording. Bursts of theta slowing while entering the drowsy state likely represent hypnagogic hypersynchrony.  Impression: EEG routine 12/2015 This awake and asleepEEG is abnormal due to occasional focal slowing over the left temporal region.  Clinical Correlation of the above findings indicates focal cerebral dysfunction over the left temporal region, suggestive of underlying structural or physiologic abnormality. The absence of epileptiform discharges does not exclude a clinical diagnosis of epilepsy. Clinical correlation is advised.  CT head 12/2015 (personally reviewed images): Brain: No evidence of infarction, hemorrhage, hydrocephalus, extra-axial collection or mass lesion/mass effect.  Vascular: No hyperdense vessel or unexpected calcification.  Skull: Suboccipital craniotomy noted without other significant abnormality.  Sinuses/Orbits: Visualized portions unremarkable.  Other: None  IMPRESSION: No evidence of intracranial abnormality.  Clinical data: Syncope; headache; fell and hit head  MRI BRAIN WITHOUT CONTRAST 2005 The ventricles are normal in size. There is no infarct or mass. White matter has normal signal. There is no evidence of intracranial blood or fluid collection.  The cerebellar tonsils are approximately 6 mm below the foramen magnum compatible with a mild Chiari malformation.  IMPRESSION  Mild Chiari malformation. No acute intracranial  abnormality.  CBC, BMP nomral 01/11/2015  Review of Systems: Patient complains of symptoms per HPI as well as the following symptoms fatigue, confusion, weakness, anxiety, allergies. Pertinent negatives per HPI. All others negative.   Social History   Social History  . Marital status: Married    Spouse name: N/A  . Number of children: 1  . Years of education: Assoc   Occupational History  . nurse    Social History Main Topics  . Smoking status: Former Smoker    Packs/day: 1.00    Years: 10.00    Types: Cigarettes    Quit date: 04/04/2004  . Smokeless tobacco: Never Used  . Alcohol use No  . Drug use: No  . Sexual activity: Not on file   Other Topics Concern  . Not on file   Social History Narrative   Works as an Therapist, sports at [employer]   Married   1 son- born 2004 has autism   Enjoys exercise, special olympics with son   Drinks 4-5 cups of coffee a day     Family History  Problem Relation Age of Onset  . Hypertension Mother   . CAD Mother   . Breast cancer Mother   . Migraines Mother   . Gout Mother   . Hypertension Father   . CAD Father   . CVA Father   . Sjogren's syndrome Sister   . Rheum arthritis Maternal Grandmother  Past Medical History:  Diagnosis Date  . Anxiety   . Chiari malformation   . Endometriosis   . GERD (gastroesophageal reflux disease)    tums otc, prevacid prn  . Headache   . Low blood pressure    pt states history of low blood pressure-fainted 12/2010-instructed to move slowly    Past Surgical History:  Procedure Laterality Date  . ABDOMINAL HYSTERECTOMY  2007  . CYSTOSCOPY  2007  . DIAGNOSTIC LAPAROSCOPY  2005  . ROTATOR CUFF REPAIR  2010   right shoulder  . SALPINGOOPHORECTOMY  04/16/2011   Procedure: SALPINGO OOPHERECTOMY;  Surgeon: Lovenia Kim, MD;  Location: [facility];  Service: Gynecology;  Laterality: Right;  . skull decompression surgery 2005      Current Outpatient Prescriptions  Medication Sig  Dispense Refill  . pantoprazole (PROTONIX) 40 MG tablet Take 1 tablet (40 mg total) by mouth daily. 30 tablet 3  . topiramate (TOPAMAX) 50 MG tablet Take 50 mg In the am and 100 mg at night for a total of 150 mg daily. 90 tablet 0   No current facility-administered medications for this visit.     Allergies as of 08/31/2016 - Review Complete 08/31/2016  Allergen Reaction Noted  . Lexapro [escitalopram oxalate]  08/16/2016    Vitals: BP 115/70 (Patient Position: Standing)   Pulse 79   Ht 5\' 9"  (1.753 m)   Wt 158 lb (71.7 kg)   BMI 23.33 kg/m  Last Weight:  Wt Readings from Last 1 Encounters:  08/31/16 158 lb (71.7 kg)   Last Height:   Ht Readings from Last 1 Encounters:  08/31/16 5\' 9"  (1.753 m)   Physical exam: Exam: Gen: NAD, conversant, well nourised, well groomed                     CV: RRR, no MRG. No Carotid Bruits. No peripheral edema, warm, nontender Eyes: Conjunctivae clear without exudates or hemorrhage  Neuro: Detailed Neurologic Exam  Speech:    Speech is normal; fluent and spontaneous with normal comprehension.  Cognition:    The patient is oriented to person, place, and time;     recent and remote memory intact;     language fluent;     normal attention, concentration,     fund of knowledge Cranial Nerves:    The pupils are equal, round, and reactive to light. The fundi are normal and spontaneous venous pulsations are present. Visual fields are full to finger confrontation. Extraocular movements are intact. Trigeminal sensation is intact and the muscles of mastication are normal. The face is symmetric. The palate elevates in the midline. Hearing intact. Voice is normal. Shoulder shrug is normal. The tongue has normal motion without fasciculations.   Coordination:    Normal finger to nose and heel to shin. Normal rapid alternating movements.   Gait:    Heel-toe and tandem gait are normal.   Motor Observation:    No asymmetry, no atrophy, and no  involuntary movements noted. Tone:    Normal muscle tone.    Posture:    Posture is normal. normal erect    Strength:    Strength is V/V in the upper and lower limbs.      Sensation: intact to LT     Reflex Exam:  DTR's:    Deep tendon reflexes in the upper and lower extremities are normal bilaterally.   Toes:    The toes are downgoing bilaterally.   Clonus:  Clonus is absent.   Assessment/Plan:  36 year old with episodes of altered awareness, 2-3 times a week can last up to 7 minutes, more "zoning out", need to evaluate for focal onset seizures. She also has a PMHx of migraines. She was started on Topiramate at last appointment as she has a hx of migraines, on 150mg  ER Topiramate daily and cannot tolerate furthe rincrease. Her mood is labile and she is crying in the office today, lots of stress in her life.  MRI w/wo contrast  90-minute sleep deprived EEG was normal. Will refer to Ernest Haber at [facility] for EMU admission evaluation May be non-epileptic events, need to capture on EEG and video As far as your medications are concerned, I would like to suggest: Continue Topiramate. Will start Gabapentin 300mg  three times a day for migraine but this is also an AED.  As far as diagnostic testing: MRI brain w/wo contrast and refer to Dr. Ernest Haber  Patient is unable to drive, operate heavy machinery, perform activities at heights or participate in water activities until 6 months seizure free. Discussed again today, she should not drive.   Discussed Patients with epilepsy have a small risk of sudden unexpected death, a condition referred to as sudden unexpected death in epilepsy (SUDEP). SUDEP is defined specifically as the sudden, unexpected, witnessed or unwitnessed, nontraumatic and nondrowning death in patients with epilepsy with or without evidence for a seizure, and excluding documented status epilepticus, in which post mortem examination does not reveal a  structural or toxicologic cause for death   Call 911 for any other episodes, discussed risk of generalization to convulsive seizures  Orders Placed This Encounter  Procedures  . MR BRAIN W WO CONTRAST  . Ambulatory referral to Neurology     CC: Dr. Myna Hidalgo, MD  [facility] [address] [place], [place] [address]  Phone [phone] Fax [phone]  A total of 35 minutes was spent face-to-face with this patient. Over half this time was spent on counseling patient on the migraine, transient alteration of awareness diagnosis and different diagnostic and therapeutic options available.

## 2016-08-31 NOTE — Progress Notes (Signed)
Toradol 60 mg/4ml administered IM to R glut using aseptic technique. Pt tolerated well.Bandaid applied.

## 2016-09-01 ENCOUNTER — Ambulatory Visit: Payer: PRIVATE HEALTH INSURANCE | Admitting: Rheumatology

## 2016-09-01 ENCOUNTER — Telehealth: Payer: Self-pay | Admitting: Neurology

## 2016-09-01 MED ORDER — ALPRAZOLAM 0.5 MG PO TABS: ORAL_TABLET | ORAL | 0 refills | Status: DC

## 2016-09-01 NOTE — Telephone Encounter (Signed)
Signed and faxed to pharmacy

## 2016-09-01 NOTE — Telephone Encounter (Signed)
I scheduled the patients MRI for 09/08/16 at our [facility] mobile [facility]... She informed me that she will need something because she is claustrophic.

## 2016-09-01 NOTE — Telephone Encounter (Signed)
Xanax rx printed, awaiting MD review/signature.

## 2016-09-02 ENCOUNTER — Telehealth: Payer: Self-pay

## 2016-09-02 NOTE — Telephone Encounter (Signed)
Referral faxed to [facility] F # [phone] for 3 day ambulatory EEG.

## 2016-09-08 ENCOUNTER — Ambulatory Visit (INDEPENDENT_AMBULATORY_CARE_PROVIDER_SITE_OTHER): Payer: BLUE CROSS/BLUE SHIELD

## 2016-09-08 ENCOUNTER — Encounter: Payer: Self-pay | Admitting: Neurology

## 2016-09-08 DIAGNOSIS — R569 Unspecified convulsions: Secondary | ICD-10-CM

## 2016-09-08 DIAGNOSIS — R200 Anesthesia of skin: Secondary | ICD-10-CM

## 2016-09-08 MED ORDER — GADOPENTETATE DIMEGLUMINE 469.01 MG/ML IV SOLN: 15.0000 mL | Freq: Once | INTRAVENOUS | Status: DC | PRN

## 2016-09-16 ENCOUNTER — Telehealth: Payer: Self-pay

## 2016-09-16 NOTE — Telephone Encounter (Signed)
Reviewed MRI results w/ pt. Verbalized understanding and appreciation for call.

## 2016-09-16 NOTE — Telephone Encounter (Signed)
-----   Message from Melvenia Beam, MD sent at 09/14/2016  8:17 AM EDT ----- No changes since 2005. 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 Sometimes this can increase risk of seizures or may be completely benign. thanks

## 2016-09-27 ENCOUNTER — Ambulatory Visit: Payer: PRIVATE HEALTH INSURANCE | Admitting: Rheumatology

## 2016-09-29 ENCOUNTER — Ambulatory Visit: Payer: BLUE CROSS/BLUE SHIELD | Admitting: Family Medicine

## 2016-09-30 ENCOUNTER — Ambulatory Visit: Payer: PRIVATE HEALTH INSURANCE | Admitting: Rheumatology

## 2016-10-04 ENCOUNTER — Ambulatory Visit: Payer: PRIVATE HEALTH INSURANCE | Admitting: Rheumatology

## 2016-10-04 NOTE — Progress Notes (Signed)
  Office Visit Note  Patient: Kim Pham             Date of Birth: 03/28/1981           MRN: [id number]             PCP: O'Sullivan, Melissa, NP Referring: O'Sullivan, Melissa, NP Visit Date: 10/06/2016 Occupation: @GUAROCC@    Subjective:  Pain in hands and ankles.   History of Present Illness: Kim Pham is a 35 y.o. female with history of polyarthralgia and positive ANA. She continues to have joint pain and stiffness involving her hands wrist elbows and ankle joints. Her ankles swell at times. She continues to have fatigue oral ulcers hair loss and weight loss. Patient reports that she was seen by Dr. Ahern who is suspecting that patient might be having seizures. She'll be in the inpatient unit for observation next month.  Activities of Daily Living:  Patient reports morning stiffness for 30 minute.   Patient Denies nocturnal pain.  Difficulty dressing/grooming: Denies Difficulty climbing stairs: Reports Difficulty getting out of chair: Reports Difficulty using hands for taps, buttons, cutlery, and/or writing: Reports   Review of Systems  Constitutional: Positive for fatigue. Negative for night sweats, weight gain, weight loss and weakness.  HENT: Positive for mouth sores and mouth dryness. Negative for trouble swallowing, trouble swallowing and nose dryness.   Eyes: Negative for pain, redness, visual disturbance and dryness.  Respiratory: Negative for cough, shortness of breath and difficulty breathing.   Cardiovascular: Negative for chest pain, palpitations, hypertension, irregular heartbeat and swelling in legs/feet.  Gastrointestinal: Negative for blood in stool, constipation and diarrhea.  Endocrine: Negative for increased urination.  Genitourinary: Negative for vaginal dryness.  Musculoskeletal: Positive for arthralgias, joint pain, joint swelling and morning stiffness. Negative for myalgias, muscle weakness, muscle tenderness and myalgias.  Skin:  Positive for color change, hair loss and sensitivity to sunlight. Negative for rash, skin tightness and ulcers.  Allergic/Immunologic: Negative for susceptible to infections.  Neurological: Negative for dizziness, memory loss and night sweats.  Hematological: Negative for swollen glands.  Psychiatric/Behavioral: Positive for sleep disturbance. Negative for depressed mood. The patient is not nervous/anxious.     PMFS History:  Patient Active Problem List   Diagnosis Date Noted  . Pain in joint, multiple sites 08/18/2016  . Fatigue 03/26/2016  . ANA positive 03/26/2016  . Depression 03/26/2016  . Numbness and tingling 02/25/2016  . Transient alteration of awareness 01/01/2016  . Anxiety state 12/19/2015  . Chiari malformation 12/19/2015    Past Medical History:  Diagnosis Date  . Anxiety   . Chiari malformation   . Endometriosis   . GERD (gastroesophageal reflux disease)    tums otc, prevacid prn  . Headache   . Low blood pressure    pt states history of low blood pressure-fainted 12/2010-instructed to move slowly    Family History  Problem Relation Age of Onset  . Hypertension Mother   . CAD Mother   . Breast cancer Mother   . Migraines Mother   . Gout Mother   . Hypertension Father   . CAD Father   . CVA Father   . Sjogren's syndrome Sister   . Rheum arthritis Maternal Grandmother    Past Surgical History:  Procedure Laterality Date  . ABDOMINAL HYSTERECTOMY  2007  . CYSTOSCOPY  2007  . DIAGNOSTIC LAPAROSCOPY  2005  . ROTATOR CUFF REPAIR  2010   right shoulder  . SALPINGOOPHORECTOMY  04/16/2011     Procedure: SALPINGO OOPHERECTOMY;  Surgeon: Lovenia Kim, MD;  Location: [facility];  Service: Gynecology;  Laterality: Right;  . skull decompression surgery 2005     Social History   Social History Narrative   Works as an Therapist, sports at [employer]   Married   1 son- born 2004 has autism   Enjoys exercise, special olympics with son   Drinks 4-5 cups of coffee a day       Objective: Vital Signs: BP 100/60   Resp 14   Ht 5' 9" (1.753 m)   Wt 156 lb (70.8 kg)   BMI 23.04 kg/m    Physical Exam  Constitutional: She is oriented to person, place, and time. She appears well-developed and well-nourished.  HENT:  Head: Normocephalic and atraumatic.  Eyes: Conjunctivae and EOM are normal.  Neck: Normal range of motion.  Cardiovascular: Normal rate, regular rhythm, normal heart sounds and intact distal pulses.   Pulmonary/Chest: Effort normal and breath sounds normal.  Abdominal: Soft. Bowel sounds are normal.  Lymphadenopathy:    She has no cervical adenopathy.  Neurological: She is alert and oriented to person, place, and time.  Skin: Skin is warm and dry. Capillary refill takes less than 2 seconds.  Hair thinning  Psychiatric: She has a normal mood and affect. Her behavior is normal.  Nursing note and vitals reviewed.    Musculoskeletal Exam: C-spine and thoracic lumbar spine good range of motion. Shoulder joints although joints wrist joint MCPs PIPs DIPs are good range of motion. She had tenderness on palpation over bilateral wrist joints and MCP joints but no synovitis was noted. She had discomfort with range of motion of bilateral hip joints and knee joints without any warmth swelling or effusion. She is tenderness across her MTP joints without any synovitis.  CDAI Exam: CDAI Homunculus Exam:   Joint Counts:  CDAI Tender Joint count: 0 CDAI Swollen Joint count: 0  Global Assessments:  Patient Global Assessment: 5 Provider Global Assessment: 3  CDAI Calculated Score: 8    Investigation: Findings:  08/23/2016 ANA positive with Titer 1:320, C3 and C4 normal, Sedimentation rate normal 4 ,Tissue transglutaminase, IgA normal, and urrinalysis, Routine w reflex microscopic normal   November 2017  Normal ESR, RF normal, TSH normal , LFT normal ,CBC normal, Lyme test/RMSF normal neg , ANA was positive low titer 1:40 and D dimer was positive  (0.51).  08/16/2016 CBC normal, 08/23/2016 CMP normal, UA negative, ENA negative, ANCA negative, C3-C4 normal, ANA 1:320 homogeneous, ESR 4, CK 36, RF less than 14, CCP less than 16, anti-tTG negative, antigliadin negative  Imaging: Mr Kizzie Fantasia Contrast  Result Date: 09/10/2016  [facility] [address], [place], [place] [address] [phone] NEUROIMAGING REPORT STUDY DATE: 09/08/2016 PATIENT NAME: KAAVYA PUSKARICH DOB: 12/24/1980 MRN: [id number] EXAM: MRI Brain with and without contrast ORDERING CLINICIAN: Sarina Ill M.D. CLINICAL HISTORY: 36 year old woman with seizures COMPARISON FILMS: MRI of the head 01/18/2016 and MRI of the head 12/11/2003 TECHNIQUE: MRI of the brain with and without contrast was obtained utilizing 5 mm axial slices with T1, T2, T2 flair, T2 star gradient echo and diffusion weighted views.  T1 sagittal, T2 coronal and postcontrast views in the axial and coronal plane were obtained. CONTRAST: 15 ml Magnevist IMAGING SITE: [facility], [address] FINDINGS: On sagittal images, the spinal cord is imaged caudally to C3 and is normal in caliber.   There is been a prior suboccipital decompression,  also present on the 01/18/2016 MRI but not on the 2005 MRI.  The adjacent brain appears normal. The pituitary gland and optic chiasm appear normal.    Brain volume appears normal.   The ventricles are normal in size and without distortion.  Incidental note is made of the septum cavum prism. There are no abnormal extra-axial collections of fluid.  The cerebellum and brainstem appears normal.   The deep gray matter appears normal.  There is subtle T2/flair hyperintensity noted in the deep white matter of the parietal lobes, present on both of the prior MRIs and unchanged.  Diffusion weighted images are normal.  Gradient echo heme weighted images are normal.  The orbits appear normal.   The VIIth/VIIIth nerve complex appears normal.  The mastoid air  cells appear normal.  The paranasal sinuses appear normal.  Flow voids are identified within the major intracerebral arteries.   After the infusion of contrast material, a normal enhancement pattern is noted.   This MRI of the brain with and without contrast shows the following: 1.    Prior suboccipital decompression for her history of Chiari malformation. The adjacent brain appears normal. 2.    There is subtle T2/flair hyperintensity in the deep white matter of the parietal lobes, that in retrospect appears to be present on the 2005 and 2017 MRI as well. This may represent gray matter heterotopia.   3.     There is a normal enhancement pattern.    There are no acute findings. INTERPRETING PHYSICIAN: Richard A. Sater, MD, PhD Certified in  Neuroimaging by American Society of Neuroimaging   Us Extrem Up Bilat Comp  Result Date: 10/06/2016 Ultrasound examination of bilateral hands was performed per EULAR recommendations. Using 12 MHz transducer, grayscale and power Doppler bilateral second, third, and fifth MCP joints and bilateral wrist joints both dorsal and volar aspects were evaluated to look for synovitis or tenosynovitis. The findings were there was mild synovitis in bilateral second third and fifth MCP joints and bilateral wrist joints on ultrasound examination. No tenosynovitis was noted. Right median nerve was 0.08 cm squares which was within normal limits and left median nerve was 0.09 cm squares which was within normal limits. Impression: Ultrasound examination was consistent with mild synovitis involving her hands and wrists joints.   Speciality Comments: No specialty comments available.    Procedures:  No procedures performed Allergies: Lexapro [escitalopram oxalate]   Assessment / Plan:     Visit Diagnoses: Autoimmune disease - ANA 1:320 homogeneous, h/oFatigue, oral ulcers, sicca symptoms, arthralgias, photosensitivity, Raynaud's, hair loss. The ultrasound today reveal synovitis in  her hands and wrists joints. We had detailed discussion regarding her current symptoms and possible explanation of her disease process. Different treatment options and their side effects were discussed at length. Indications CONTRAINDICATIONS of Plaquenil were discussed today. The plan is to start her on Plaquenil 200 mg by mouth twice a day Monday to Friday. We will check labs in a month then every 3 months to monitor for drug toxicity. She will also need IV exam baseline and then yearly to monitor for any drug ocular toxicity.  Pain in joint, multiple sites - Pain hands, wrists joints, elbows, ankles, knees and hips. No clinical synovitis was noted. Which she has some synovitis on the ultrasound examination.  Numbness and tingling  History of Chiari malformation  History of anxiety and depression  Pain in both hands - Plan: US Extrem Up Bilat Comp    Orders: Orders Placed This   Encounter  Procedures  . US Extrem Up Bilat Comp  . CBC with Differential/Platelet  . COMPLETE METABOLIC PANEL WITH GFR   Meds ordered this encounter  Medications  . hydroxychloroquine (PLAQUENIL) 200 MG tablet    Sig: Take 1 tablet (200 mg total) by mouth 2 (two) times daily. Monday through Friday    Dispense:  40 tablet    Refill:  2    Face-to-face time spent with patient was 30 minutes. 50% of time was spent in counseling and coordination of care.  Follow-Up Instructions: Return in about 3 months (around 01/06/2017) for Autoimmune disease.   Shaili Deveshwar, MD  Note - This record has been created using Dragon software.  Chart creation errors have been sought, but may not always  have been located. Such creation errors do not reflect on  the standard of medical care. 

## 2016-10-06 ENCOUNTER — Ambulatory Visit (INDEPENDENT_AMBULATORY_CARE_PROVIDER_SITE_OTHER): Payer: BLUE CROSS/BLUE SHIELD | Admitting: Rheumatology

## 2016-10-06 ENCOUNTER — Inpatient Hospital Stay (INDEPENDENT_AMBULATORY_CARE_PROVIDER_SITE_OTHER): Payer: Self-pay

## 2016-10-06 ENCOUNTER — Encounter: Payer: Self-pay | Admitting: Rheumatology

## 2016-10-06 VITALS — BP 100/60 | Resp 14 | Ht 69.0 in | Wt 156.0 lb

## 2016-10-06 DIAGNOSIS — M2550 Pain in unspecified joint: Secondary | ICD-10-CM | POA: Diagnosis not present

## 2016-10-06 DIAGNOSIS — R768 Other specified abnormal immunological findings in serum: Secondary | ICD-10-CM | POA: Diagnosis not present

## 2016-10-06 DIAGNOSIS — R5383 Other fatigue: Secondary | ICD-10-CM | POA: Diagnosis not present

## 2016-10-06 DIAGNOSIS — R202 Paresthesia of skin: Secondary | ICD-10-CM

## 2016-10-06 DIAGNOSIS — M79642 Pain in left hand: Secondary | ICD-10-CM | POA: Diagnosis not present

## 2016-10-06 DIAGNOSIS — Z79899 Other long term (current) drug therapy: Secondary | ICD-10-CM | POA: Diagnosis not present

## 2016-10-06 DIAGNOSIS — M79641 Pain in right hand: Secondary | ICD-10-CM

## 2016-10-06 DIAGNOSIS — Z8659 Personal history of other mental and behavioral disorders: Secondary | ICD-10-CM | POA: Diagnosis not present

## 2016-10-06 DIAGNOSIS — R200 Anesthesia of skin: Secondary | ICD-10-CM

## 2016-10-06 DIAGNOSIS — K121 Other forms of stomatitis: Secondary | ICD-10-CM

## 2016-10-06 DIAGNOSIS — Z8669 Personal history of other diseases of the nervous system and sense organs: Secondary | ICD-10-CM

## 2016-10-06 MED ORDER — HYDROXYCHLOROQUINE SULFATE 200 MG PO TABS: 200.0000 mg | ORAL_TABLET | Freq: Two times a day (BID) | ORAL | 2 refills | Status: DC

## 2016-10-06 NOTE — Progress Notes (Signed)
Pharmacy Note  Subjective: Patient presents today to the [facility] to see Dr. Estanislado Pandy.  Patient seen by the pharmacist for counseling on hydroxychloroquine.    Objective: CMP Latest Ref Rng & Units 08/23/2016 03/08/2016 01/01/2016  Glucose 65 - 99 mg/dL 88 88 91  BUN 7 - 25 mg/dL 9 8 12   Creatinine 0.50 - 1.10 mg/dL 0.72 0.72 0.65  Sodium 135 - 146 mmol/L 141 140 143  Potassium 3.5 - 5.3 mmol/L 4.1 4.0 5.2  Chloride 98 - 110 mmol/L 109 108 102  CO2 20 - 31 mmol/L 20 25 23   Calcium 8.6 - 10.2 mg/dL 9.4 9.7 9.8  Total Protein 6.1 - 8.1 g/dL 6.9 7.1 7.5  Total Bilirubin 0.2 - 1.2 mg/dL 0.3 0.4 0.3  Alkaline Phos 33 - 115 U/L 62 48 53  AST 10 - 30 U/L 19 17 21   ALT 6 - 29 U/L 18 20 20    CBC    Component Value Date/Time   WBC 5.8 08/16/2016 0921   RBC 4.59 08/16/2016 0921   HGB 13.1 08/16/2016 0921   HGB 13.0 01/01/2016 0841   HCT 39.9 08/16/2016 0921   HCT 41.7 01/01/2016 0841   PLT 274.0 08/16/2016 0921   PLT 280 01/01/2016 0841   MCV 86.8 08/16/2016 0921   MCV 89 01/01/2016 0841   MCH 27.8 01/01/2016 0841   MCH 29.0 01/11/2015 1927   MCHC 32.9 08/16/2016 0921   RDW 13.7 08/16/2016 0921   RDW 13.9 01/01/2016 0841   LYMPHSABS 1.7 08/16/2016 0921   MONOABS 0.4 08/16/2016 0921   EOSABS 0.1 08/16/2016 0921   BASOSABS 0.0 08/16/2016 5035    Assessment/Plan: Patient was prescribed hydroxychloroquine 200 mg BID Monday through Friday.  Patient was counseled on the purpose, proper use, and adverse effects of hydroxychloroquine including nausea/diarrhea, skin rash, headaches, and sun sensitivity.  Discussed importance of annual eye exams while on hydroxychloroquine to monitor to ocular toxicity and discussed importance of frequent laboratory monitoring.  Provided patient with eye exam form for baseline ophthalmologic exam and standing lab instructions.  Provided patient with educational materials on hydroxychloroquine and answered all questions.  Patient consented to  hydroxychloroquine.  Will upload consent in the media tab.    Elisabeth Most, Pharm.D., BCPS Clinical Pharmacist Pager: [phone] Phone: [phone] 10/06/2016 3:27 PM

## 2016-10-06 NOTE — Patient Instructions (Signed)
Standing Labs We placed an order today for your standing lab work.    Please come back and get your standing labs in 1 month then 3 months  We have open lab Monday through Friday from 8:30-11:30 AM and 1:30-4 PM at the office of Dr. Tresa Moore, PA.   The office is located at [address] [place], [place], [place] [address] No appointment is necessary.   Labs are drawn by [facility].  You may receive a bill from [facility] for your lab work. If you have any questions regarding directions or hours of operation,  please call [phone].    Hydroxychloroquine tablets What is this medicine? HYDROXYCHLOROQUINE (hye drox ee KLOR oh kwin) is used to treat rheumatoid arthritis and systemic lupus erythematosus. It is also used to treat malaria. This medicine may be used for other purposes; ask your health care provider or pharmacist if you have questions. COMMON BRAND NAME(S): Plaquenil, Quineprox What should I tell my health care provider before I take this medicine? They need to know if you have any of these conditions: -diabetes -eye disease, vision problems -G6PD deficiency -history of blood diseases -history of irregular heartbeat -if you often drink alcohol -kidney disease -liver disease -porphyria -psoriasis -seizures -an unusual or allergic reaction to chloroquine, hydroxychloroquine, other medicines, foods, dyes, or preservatives -pregnant or trying to get pregnant -breast-feeding How should I use this medicine? Take this medicine by mouth with a glass of water. Follow the directions on the prescription label. Avoid taking antacids within 4 hours of taking this medicine. It is best to separate these medicines by at least 4 hours. Do not cut, crush or chew this medicine. You can take it with or without food. If it upsets your stomach, take it with food. Take your medicine at regular intervals. Do not take your medicine more often than directed. Take all of your  medicine as directed even if you think you are better. Do not skip doses or stop your medicine early. Talk to your pediatrician regarding the use of this medicine in children. While this drug may be prescribed for selected conditions, precautions do apply. Overdosage: If you think you have taken too much of this medicine contact a poison control center or emergency room at once. NOTE: This medicine is only for you. Do not share this medicine with others. What if I miss a dose? If you miss a dose, take it as soon as you can. If it is almost time for your next dose, take only that dose. Do not take double or extra doses. What may interact with this medicine? Do not take this medicine with any of the following medications: -cisapride -dofetilide -dronedarone -live virus vaccines -penicillamine -pimozide -thioridazine -ziprasidone This medicine may also interact with the following medications: -ampicillin -antacids -cimetidine -cyclosporine -digoxin -medicines for diabetes, like insulin, glipizide, glyburide -medicines for seizures like carbamazepine, phenobarbital, phenytoin -mefloquine -methotrexate -other medicines that prolong the QT interval (cause an abnormal heart rhythm) -praziquantel This list may not describe all possible interactions. Give your health care provider a list of all the medicines, herbs, non-prescription drugs, or dietary supplements you use. Also tell them if you smoke, drink alcohol, or use illegal drugs. Some items may interact with your medicine. What should I watch for while using this medicine? Tell your doctor or healthcare professional if your symptoms do not start to get better or if they get worse. Avoid taking antacids within 4 hours of taking this medicine. It is best to  separate these medicines by at least 4 hours. Tell your doctor or health care professional right away if you have any change in your eyesight. Your vision and blood may be tested  before and during use of this medicine. This medicine can make you more sensitive to the sun. Keep out of the sun. If you cannot avoid being in the sun, wear protective clothing and use sunscreen. Do not use sun lamps or tanning beds/booths. What side effects may I notice from receiving this medicine? Side effects that you should report to your doctor or health care professional as soon as possible: -allergic reactions like skin rash, itching or hives, swelling of the face, lips, or tongue -changes in vision -decreased hearing or ringing of the ears -redness, blistering, peeling or loosening of the skin, including inside the mouth -seizures -sensitivity to light -signs and symptoms of a dangerous change in heartbeat or heart rhythm like chest pain; dizziness; fast or irregular heartbeat; palpitations; feeling faint or lightheaded, falls; breathing problems -signs and symptoms of liver injury like dark yellow or brown urine; general ill feeling or flu-like symptoms; light-colored stools; loss of appetite; nausea; right upper belly pain; unusually weak or tired; yellowing of the eyes or skin -signs and symptoms of low blood sugar such as feeling anxious; confusion; dizziness; increased hunger; unusually weak or tired; sweating; shakiness; cold; irritable; headache; blurred vision; fast heartbeat; loss of consciousness -uncontrollable head, mouth, neck, arm, or leg movements Side effects that usually do not require medical attention (report to your doctor or health care professional if they continue or are bothersome): -anxious -diarrhea -dizziness -hair loss -headache -irritable -loss of appetite -nausea, vomiting -stomach pain This list may not describe all possible side effects. Call your doctor for medical advice about side effects. You may report side effects to FDA at 1-800-FDA-1088. Where should I keep my medicine? Keep out of the reach of children. In children, this medicine can cause  overdose with small doses. Store at room temperature between 15 and 30 degrees C (59 and 86 degrees F). Protect from moisture and light. Throw away any unused medicine after the expiration date. NOTE: This sheet is a summary. It may not cover all possible information. If you have questions about this medicine, talk to your doctor, pharmacist, or health care provider.  2018 Elsevier/Gold Standard (2015-11-19 14:16:15)

## 2016-10-28 ENCOUNTER — Ambulatory Visit: Payer: PRIVATE HEALTH INSURANCE | Admitting: Rheumatology

## 2016-11-01 DIAGNOSIS — G40804 Other epilepsy, intractable, without status epilepticus: Secondary | ICD-10-CM

## 2016-11-01 HISTORY — DX: Other epilepsy, intractable, without status epilepticus: G40.804

## 2016-11-05 MED FILL — KETOROLAC 10 MG TABLET: 10 | 10 days supply | Qty: 30 | Fill #0

## 2016-11-10 ENCOUNTER — Telehealth: Payer: Self-pay

## 2016-11-10 NOTE — Telephone Encounter (Signed)
Received H & P and D/C summary from admission to [facility] for epilepsy with plan for video EEG, seizure precautions and Ativan prn. Topiramate was decreased to 50 mg BID and then discontinued during monitoring. She had 2 clinical events of unresponsiveness, no abnormal movements or behaviors except L sided shaking, suspected that events may be r/t migrainous etiology. She was d/c'd on a higher dose of topiramate for migraine prevention. Copy given to Dr. Jaynee Eagles for review.

## 2016-11-19 ENCOUNTER — Other Ambulatory Visit: Payer: Self-pay

## 2016-11-19 DIAGNOSIS — Z79899 Other long term (current) drug therapy: Secondary | ICD-10-CM

## 2016-11-19 LAB — CBC WITH DIFFERENTIAL/PLATELET
Basophils Absolute: 0 cells/uL (ref 0–200)
Basophils Relative: 0 %
Eosinophils Absolute: 80 cells/uL (ref 15–500)
Eosinophils Relative: 1 %
HCT: 40.1 % (ref 35.0–45.0)
Hemoglobin: 12.9 g/dL (ref 11.7–15.5)
Lymphocytes Relative: 16 %
Lymphs Abs: 1280 cells/uL (ref 850–3900)
MCH: 28.4 pg (ref 27.0–33.0)
MCHC: 32.2 g/dL (ref 32.0–36.0)
MCV: 88.1 fL (ref 80.0–100.0)
MPV: 9.8 fL (ref 7.5–12.5)
Monocytes Absolute: 480 cells/uL (ref 200–950)
Monocytes Relative: 6 %
Neutro Abs: 6160 cells/uL (ref 1500–7800)
Neutrophils Relative %: 77 %
Platelets: 278 10*3/uL (ref 140–400)
RBC: 4.55 MIL/uL (ref 3.80–5.10)
RDW: 13.7 % (ref 11.0–15.0)
WBC: 8 10*3/uL (ref 3.8–10.8)

## 2016-11-20 LAB — COMPLETE METABOLIC PANEL WITH GFR
ALT: 15 U/L (ref 6–29)
AST: 17 U/L (ref 10–30)
Albumin: 4.4 g/dL (ref 3.6–5.1)
Alkaline Phosphatase: 56 U/L (ref 33–115)
BUN: 12 mg/dL (ref 7–25)
CO2: 18 mmol/L — ABNORMAL LOW (ref 20–31)
Calcium: 9.5 mg/dL (ref 8.6–10.2)
Chloride: 111 mmol/L — ABNORMAL HIGH (ref 98–110)
Creat: 0.75 mg/dL (ref 0.50–1.10)
GFR, Est African American: >89 mL/min (ref >=60)
GFR, Est Non African American: >89 mL/min (ref >=60)
Glucose, Bld: 81 mg/dL (ref 65–99)
Potassium: 4.3 mmol/L (ref 3.5–5.3)
Sodium: 138 mmol/L (ref 135–146)
Total Bilirubin: 0.4 mg/dL (ref 0.2–1.2)
Total Protein: 6.7 g/dL (ref 6.1–8.1)

## 2016-11-21 NOTE — Progress Notes (Signed)
WNLs

## 2016-11-22 ENCOUNTER — Ambulatory Visit (INDEPENDENT_AMBULATORY_CARE_PROVIDER_SITE_OTHER): Payer: BLUE CROSS/BLUE SHIELD | Admitting: Family

## 2016-11-22 ENCOUNTER — Encounter: Payer: Self-pay | Admitting: Family

## 2016-11-22 DIAGNOSIS — R404 Transient alteration of awareness: Secondary | ICD-10-CM | POA: Diagnosis not present

## 2016-11-22 DIAGNOSIS — I7300 Raynaud's syndrome without gangrene: Secondary | ICD-10-CM

## 2016-11-22 DIAGNOSIS — L659 Nonscarring hair loss, unspecified: Secondary | ICD-10-CM | POA: Insufficient documentation

## 2016-11-22 DIAGNOSIS — F418 Other specified anxiety disorders: Secondary | ICD-10-CM

## 2016-11-22 DIAGNOSIS — M329 Systemic lupus erythematosus, unspecified: Secondary | ICD-10-CM

## 2016-11-22 DIAGNOSIS — Z8669 Personal history of other diseases of the nervous system and sense organs: Secondary | ICD-10-CM | POA: Insufficient documentation

## 2016-11-22 DIAGNOSIS — L568 Other specified acute skin changes due to ultraviolet radiation: Secondary | ICD-10-CM | POA: Insufficient documentation

## 2016-11-22 DIAGNOSIS — Z79899 Other long term (current) drug therapy: Secondary | ICD-10-CM

## 2016-11-22 DIAGNOSIS — M3500 Sicca syndrome, unspecified: Secondary | ICD-10-CM

## 2016-11-22 HISTORY — DX: Sjogren syndrome, unspecified: M35.00

## 2016-11-22 HISTORY — DX: Personal history of other diseases of the nervous system and sense organs: Z86.69

## 2016-11-22 HISTORY — DX: Raynaud's syndrome without gangrene: I73.00

## 2016-11-22 HISTORY — DX: Other long term (current) drug therapy: Z79.899

## 2016-11-22 HISTORY — DX: Nonscarring hair loss, unspecified: L65.9

## 2016-11-22 HISTORY — DX: Other specified acute skin changes due to ultraviolet radiation: L56.8

## 2016-11-22 MED ORDER — VENLAFAXINE HCL ER 37.5 MG PO CP24: ORAL_CAPSULE | ORAL | 0 refills | Status: DC

## 2016-11-22 NOTE — Patient Instructions (Signed)
Please begin effexor 1 tab once daily for 1 week, then increase to 2 tabs once daily on week two.

## 2016-11-22 NOTE — Progress Notes (Signed)
Office Visit Note  Patient: Kim Pham             Date of Birth: Jul 07, 1980           MRN: [id number]             PCP: Debbrah Alar, NP Referring: Debbrah Alar, NP Visit Date: 11/24/2016 Occupation: @GUAROCC @    Subjective:  Joint pain.   History of Present Illness: Kim Pham is a 36 y.o. female with history of autoimmune disease. She states after starting Plaquenil she noticed improvement in her joint symptoms and fatigue. Even her  oral ulcers improved. 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 She also had few oral ulcers. She has noticed improvement in her sicca symptoms. No improvement in hair loss so far. She denies any joint swelling.   Activities of Daily Living:  Patient reports morning stiffness for 30 minutes.   Patient Denies nocturnal pain.  Difficulty dressing/grooming: Denies Difficulty climbing stairs: Reports Difficulty getting out of chair: Reports Difficulty using hands for taps, buttons, cutlery, and/or writing: Reports   Review of Systems  Constitutional: Positive for fatigue. Negative for night sweats, weight gain, weight loss and weakness.  HENT: Positive for mouth sores and mouth dryness. Negative for trouble swallowing, trouble swallowing and nose dryness.   Eyes: Positive for dryness. Negative for pain, redness and visual disturbance.  Respiratory: Negative for cough, shortness of breath and difficulty breathing.   Cardiovascular: Negative for chest pain, palpitations, hypertension, irregular heartbeat and swelling in legs/feet.  Gastrointestinal: Negative for blood in stool, constipation and diarrhea.  Endocrine: Negative for increased urination.  Genitourinary: Negative for vaginal dryness.  Musculoskeletal: Positive for arthralgias, joint pain and morning stiffness. Negative for joint swelling, myalgias, muscle weakness, muscle tenderness and myalgias.    Skin: Negative for color change, rash, hair loss, skin tightness, ulcers and sensitivity to sunlight.  Allergic/Immunologic: Negative for susceptible to infections.  Neurological: Negative for dizziness, memory loss and night sweats.  Hematological: Negative for swollen glands.  Psychiatric/Behavioral: Positive for depressed mood. Negative for sleep disturbance. The patient is not nervous/anxious.     PMFS History:  Patient Active Problem List   Diagnosis Date Noted  . Sicca syndrome ([place]) 11/22/2016  . Raynaud's disease without gangrene 11/22/2016  . Hair loss 11/22/2016  . Photosensitivity 11/22/2016  . History of Chiari malformation 11/22/2016  . High risk medication use 11/22/2016  . Pain in joint, multiple sites 08/18/2016  . Fatigue 03/26/2016  . SLE (systemic lupus erythematosus) ([place]) 03/26/2016  . Depression with anxiety 03/26/2016  . Numbness and tingling 02/25/2016  . Transient alteration of awareness 01/01/2016  . Chiari malformation 12/19/2015    Past Medical History:  Diagnosis Date  . Anxiety   . Chiari malformation   . Endometriosis   . GERD (gastroesophageal reflux disease)    tums otc, prevacid prn  . Headache   . Low blood pressure    pt states history of low blood pressure-fainted 12/2010-instructed to move slowly  . SLE (systemic lupus erythematosus) ([place]) 03/26/2016    Family History  Problem Relation Age of Onset  . Hypertension Mother   . CAD Mother   . Breast cancer Mother   . Migraines Mother   . Gout Mother   . Hypertension Father   . CAD Father   . CVA Father   . Sjogren's syndrome Sister   . Rheum arthritis Maternal Grandmother  Past Surgical History:  Procedure Laterality Date  . ABDOMINAL HYSTERECTOMY  2007  . CYSTOSCOPY  2007  . DIAGNOSTIC LAPAROSCOPY  2005  . ROTATOR CUFF REPAIR  2010   right shoulder  . SALPINGOOPHORECTOMY  04/16/2011   Procedure: SALPINGO OOPHERECTOMY;  Surgeon: Lovenia Kim, MD;  Location: [facility];   Service: Gynecology;  Laterality: Right;  . skull decompression surgery 2005     Social History   Social History Narrative   Works as an Therapist, sports at [employer]   Married   1 son- born 2004 has autism   Enjoys exercise, special olympics with son   Drinks 4-5 cups of coffee a day      Objective: Vital Signs: BP 96/67 (BP Location: Left Arm, Patient Position: Sitting, Cuff Size: Normal)   Pulse 83   Ht 5\' 9"  (1.753 m)   Wt 158 lb (71.7 kg)   BMI 23.33 kg/m    Physical Exam  Constitutional: She is oriented to person, place, and time. She appears well-developed and well-nourished.  HENT:  Head: Normocephalic and atraumatic.  Eyes: Conjunctivae and EOM are normal.  Neck: Normal range of motion.  Cardiovascular: Normal rate, regular rhythm, normal heart sounds and intact distal pulses.   Pulmonary/Chest: Effort normal and breath sounds normal.  Abdominal: Soft. Bowel sounds are normal.  Lymphadenopathy:    She has no cervical adenopathy.  Neurological: She is alert and oriented to person, place, and time.  Skin: Skin is warm and dry. Capillary refill takes less than 2 seconds.  Psychiatric: She has a normal mood and affect. Her behavior is normal.  Nursing note and vitals reviewed.    Musculoskeletal Exam: C-spine and thoracic lumbar spine good range of motion. Shoulder joints elbow joints wrist joint MCPs PIPs DIPs with good range of motion. She has some tenderness over her MCP joints but no synovitis was noted. Hip joints knee joints ankles MTPs PIPs DIPs with good range of motion with no synovitis.  CDAI Exam: CDAI Homunculus Exam:   Tenderness:  Right hand: 2nd MCP, 3rd MCP and 4th MCP Left hand: 2nd MCP, 3rd MCP and 4th MCP  Joint Counts:  CDAI Tender Joint count: 6 CDAI Swollen Joint count: 0  Global Assessments:  Patient Global Assessment: 5 Provider Global Assessment: 3  CDAI Calculated Score: 14    Investigation: No additional findings. CBC Latest  Ref Rng & Units 11/19/2016 08/16/2016 03/08/2016  WBC 3.8 - 10.8 K/uL 8.0 5.8 4.4  Hemoglobin 11.7 - 15.5 g/dL 12.9 13.1 13.3  Hematocrit 35.0 - 45.0 % 40.1 39.9 39.6  Platelets 140 - 400 K/uL 278 274.0 240.0    CMP Latest Ref Rng & Units 11/19/2016 08/23/2016 03/08/2016  Glucose 65 - 99 mg/dL 81 88 88  BUN 7 - 25 mg/dL 12 9 8   Creatinine 0.50 - 1.10 mg/dL 0.75 0.72 0.72  Sodium 135 - 146 mmol/L 138 141 140  Potassium 3.5 - 5.3 mmol/L 4.3 4.1 4.0  Chloride 98 - 110 mmol/L 111(H) 109 108  CO2 20 - 31 mmol/L 18(L) 20 25  Calcium 8.6 - 10.2 mg/dL 9.5 9.4 9.7  Total Protein 6.1 - 8.1 g/dL 6.7 6.9 7.1  Total Bilirubin 0.2 - 1.2 mg/dL 0.4 0.3 0.4  Alkaline Phos 33 - 115 U/L 56 62 48  AST 10 - 30 U/L 17 19 17   ALT 6 - 29 U/L 15 18 20     Imaging: No results found.  Speciality Comments: No specialty comments available.  Procedures:  No procedures performed Allergies: Lexapro [escitalopram oxalate]   Assessment / Plan:     Visit Diagnoses: Autoimmune disease (HCC)ANA 1:320 homogeneous, h/oFatigue, oral ulcers, sicca symptoms, arthralgias, photosensitivity, Raynaud's, hair loss. She has noticed improvement in her symptoms since she started taking Plaquenil. Although the nightshifts causes stress and flare of her symptoms. She is thinking about changing her job.  High risk medication use - Plaquenil 200 mg by mouth twice a day Monday to Friday. Her eye exam is scheduled next month. Her labs will be done in 3 months and every 5 months to monitor for drug toxicity.  Other fatigue: She's noticed some improvement.  Pain in joint, multiple sites: She continues to have some arthralgias but no synovitis on examination.  Sicca syndrome ([place]): Improved  Raynaud's disease without gangrene: Not active currently  Hair loss: Not much improvement in her hair loss  Photosensitivity: Advised sunscreen  History of Chiari malformation  History of gastroesophageal reflux (GERD)   Depression: She  started Wellbutrin recently.  Orders: No orders of the defined types were placed in this encounter.  No orders of the defined types were placed in this encounter.     Follow-Up Instructions: Return in about 4 months (around 03/26/2017) for Autoimmune disease.   Bo Merino, MD  Note - This record has been created using Editor, commissioning.  Chart creation errors have been sought, but may not always  have been located. Such creation errors do not reflect on  the standard of medical care.

## 2016-11-22 NOTE — Assessment & Plan Note (Signed)
Depression remains uncontrolled.  We discussed the importance of trying to find a less stressful job. She did not tolerate Lexapro. We'll give her a trial of Effexor.

## 2016-11-22 NOTE — Assessment & Plan Note (Signed)
Recently started on Plaquenil. Reports that she is feeling some better since starting. Management per rheumatology. Her weight has stabilized.

## 2016-11-22 NOTE — Assessment & Plan Note (Signed)
Etiology remains unclear. She is advised to keep her follow-up appointment with rheumatology and neurology.

## 2016-11-22 NOTE — Progress Notes (Signed)
Subjective:    Patient ID: Kim Pham, female    DOB: 13-Dec-1980, 36 y.o.   MRN: [id number]  HPI  [name] is a 36 yr old female who presents today for follow up of multiple medical problems.    1) depression-was not sleeping on lexapro and had GI upset.  Reports that in the past she has not tried any other medications.    Wt Readings from Last 3 Encounters:  11/22/16 158 lb 3.2 oz (71.8 kg)  10/06/16 156 lb (70.8 kg)  08/31/16 158 lb (71.7 kg)   2) SLE- reports that she was recently diagnosed with SLE. She was recently started on Plaquenil 200 mg by her rheumatologist. Reports feeling less achey since starting.   3) ? seizure disorder-of note she was admitted at no monitor on July 16. She apparently had some jerking episodes prior to this admission. These jerking episodes were accompanied by loss of awareness and staring spells. She also had left upper extremity and left lower extremity jerking. These events typically last 3-5 minutes. She was admitted to the epilepsy monitoring unit. Pt was told that these episods were possibly "autoimmune rleated."  Thought that it could be related to stress, pain, migraine.  She was receiving IV toradol while she was hospitalized.  She is continuing toradol prn and her topamax was increased.  She was advised to follow up with with her primary neurologist and rheumatologist.   Review of Systems See HPI  Past Medical History:  Diagnosis Date  . Anxiety   . Chiari malformation   . Endometriosis   . GERD (gastroesophageal reflux disease)    tums otc, prevacid prn  . Headache   . Low blood pressure    pt states history of low blood pressure-fainted 12/2010-instructed to move slowly     Social History   Social History  . Marital status: Married    Spouse name: N/A  . Number of children: 1  . Years of education: Assoc   Occupational History  . nurse    Social History Main Topics  . Smoking status: Former Smoker    Packs/day:  1.00    Years: 10.00    Types: Cigarettes    Quit date: 04/04/2004  . Smokeless tobacco: Never Used  . Alcohol use No  . Drug use: No  . Sexual activity: Not on file   Other Topics Concern  . Not on file   Social History Narrative   Works as an Therapist, sports at [employer]   Married   1 son- born 2004 has autism   Enjoys exercise, special olympics with son   Drinks 4-5 cups of coffee a day     Past Surgical History:  Procedure Laterality Date  . ABDOMINAL HYSTERECTOMY  2007  . CYSTOSCOPY  2007  . DIAGNOSTIC LAPAROSCOPY  2005  . ROTATOR CUFF REPAIR  2010   right shoulder  . SALPINGOOPHORECTOMY  04/16/2011   Procedure: SALPINGO OOPHERECTOMY;  Surgeon: Lovenia Kim, MD;  Location: [facility];  Service: Gynecology;  Laterality: Right;  . skull decompression surgery 2005      Family History  Problem Relation Age of Onset  . Hypertension Mother   . CAD Mother   . Breast cancer Mother   . Migraines Mother   . Gout Mother   . Hypertension Father   . CAD Father   . CVA Father   . Sjogren's syndrome Sister   . Rheum arthritis Maternal Grandmother  Allergies  Allergen Reactions  . Lexapro [Escitalopram Oxalate]     Insomnia, stomach upset    Current Outpatient Prescriptions on File Prior to Visit  Medication Sig Dispense Refill  . hydroxychloroquine (PLAQUENIL) 200 MG tablet Take 1 tablet (200 mg total) by mouth 2 (two) times daily. Monday through Friday 40 tablet 2  . pantoprazole (PROTONIX) 40 MG tablet Take 1 tablet (40 mg total) by mouth daily. 30 tablet 3   Current Facility-Administered Medications on File Prior to Visit  Medication Dose Route Frequency Provider Last Rate Last Dose  . gadopentetate dimeglumine (MAGNEVIST) injection 15 mL  15 mL Intravenous Once PRN Melvenia Beam, MD        BP 124/74 (BP Location: Right Arm, Cuff Size: Normal)   Pulse 72   Temp 98.7 F (37.1 C) (Oral)   Resp 16   Ht 5\' 9"  (1.753 m)   Wt 158 lb 3.2 oz (71.8 kg)   SpO2  100%   BMI 23.36 kg/m       Objective:   Physical Exam  Constitutional: She appears well-developed and well-nourished.  Cardiovascular: Normal rate, regular rhythm and normal heart sounds.   No murmur heard. Pulmonary/Chest: Effort normal and breath sounds normal. No respiratory distress. She has no wheezes.  Lymphadenopathy:    She has no cervical adenopathy.  Psychiatric: Her behavior is normal. Judgment and thought content normal.  Briefly tearful during interview          Assessment & Plan:

## 2016-11-24 ENCOUNTER — Encounter: Payer: Self-pay | Admitting: Rheumatology

## 2016-11-24 ENCOUNTER — Ambulatory Visit (INDEPENDENT_AMBULATORY_CARE_PROVIDER_SITE_OTHER): Payer: BLUE CROSS/BLUE SHIELD | Admitting: Rheumatology

## 2016-11-24 VITALS — BP 96/67 | HR 83 | Ht 69.0 in | Wt 158.0 lb

## 2016-11-24 DIAGNOSIS — R5383 Other fatigue: Secondary | ICD-10-CM | POA: Diagnosis not present

## 2016-11-24 DIAGNOSIS — I7300 Raynaud's syndrome without gangrene: Secondary | ICD-10-CM

## 2016-11-24 DIAGNOSIS — M3500 Sicca syndrome, unspecified: Secondary | ICD-10-CM | POA: Diagnosis not present

## 2016-11-24 DIAGNOSIS — L659 Nonscarring hair loss, unspecified: Secondary | ICD-10-CM | POA: Diagnosis not present

## 2016-11-24 DIAGNOSIS — D8989 Other specified disorders involving the immune mechanism, not elsewhere classified: Secondary | ICD-10-CM

## 2016-11-24 DIAGNOSIS — M2550 Pain in unspecified joint: Secondary | ICD-10-CM

## 2016-11-24 DIAGNOSIS — Z8719 Personal history of other diseases of the digestive system: Secondary | ICD-10-CM | POA: Diagnosis not present

## 2016-11-24 DIAGNOSIS — L568 Other specified acute skin changes due to ultraviolet radiation: Secondary | ICD-10-CM | POA: Diagnosis not present

## 2016-11-24 DIAGNOSIS — Z79899 Other long term (current) drug therapy: Secondary | ICD-10-CM | POA: Diagnosis not present

## 2016-11-24 DIAGNOSIS — Z8669 Personal history of other diseases of the nervous system and sense organs: Secondary | ICD-10-CM | POA: Diagnosis not present

## 2016-11-24 DIAGNOSIS — M359 Systemic involvement of connective tissue, unspecified: Secondary | ICD-10-CM

## 2016-11-24 NOTE — Patient Instructions (Signed)
Standing Labs We placed an order today for your standing lab work.    Please come back and get your standing labs in November 2018 then every 5 months.  We have open lab Monday through Friday from 8:30-11:30 AM and 1:30-4 PM at the office of Dr. Bo Merino.   The office is located at [address], [place], [place], [place] [address] No appointment is necessary.   Labs are drawn by [facility].  You may receive a bill from [facility] for your lab work. If you have any questions regarding directions or hours of operation,  please call [phone].

## 2016-11-24 NOTE — Progress Notes (Signed)
Rheumatology Medication Review by a Pharmacist Does the patient feel that his/her medications are working for him/her?  Yes Has the patient been experiencing any side effects to the medications prescribed?  No Does the patient have any problems obtaining medications?  No  Issues to address at subsequent visits: None   Pharmacist comments:  Anastasia is a pleasant 36 yo F who presents for follow up of autoimmune disease.  She is currently taking hydroxychloroquine 200 mg by mouth twice daily Monday through Friday.  Patient had labs on 11/19/16 (approximately one month after starting hydroxychloroquine) which were normal.  She will be due for labs again in 3 months then every 5 months.  Patient confirms she has her baseline hydroxychloroquine eye exam scheduled on 12/11/16.  She confirms she has the eye exam form to take with her.  Patient denies any questions or concerns regarding her medications at this time.   Elisabeth Most, Pharm.D., BCPS, CPP Clinical Pharmacist Pager: [phone] Phone: [phone] 11/24/2016 11:55 AM

## 2016-12-21 ENCOUNTER — Ambulatory Visit: Payer: Self-pay | Admitting: Family

## 2016-12-21 DIAGNOSIS — Z0289 Encounter for other administrative examinations: Secondary | ICD-10-CM

## 2016-12-22 ENCOUNTER — Encounter: Payer: Self-pay | Admitting: Neurology

## 2016-12-22 ENCOUNTER — Ambulatory Visit (INDEPENDENT_AMBULATORY_CARE_PROVIDER_SITE_OTHER): Payer: BLUE CROSS/BLUE SHIELD | Admitting: Neurology

## 2016-12-22 VITALS — BP 97/63 | HR 74 | Wt 160.2 lb

## 2016-12-22 DIAGNOSIS — G43109 Migraine with aura, not intractable, without status migrainosus: Secondary | ICD-10-CM | POA: Diagnosis not present

## 2016-12-22 DIAGNOSIS — F445 Conversion disorder with seizures or convulsions: Secondary | ICD-10-CM | POA: Insufficient documentation

## 2016-12-22 DIAGNOSIS — R569 Unspecified convulsions: Secondary | ICD-10-CM | POA: Insufficient documentation

## 2016-12-22 HISTORY — DX: Unspecified convulsions: R56.9

## 2016-12-22 MED ORDER — TOPIRAMATE ER 200 MG PO SPRINKLE CAP24: 1.0000 | EXTENDED_RELEASE_CAPSULE | Freq: Every day | ORAL | 4 refills | Status: DC

## 2016-12-22 MED ORDER — KETOROLAC TROMETHAMINE 10 MG PO TABS: 10.0000 mg | ORAL_TABLET | Freq: Four times a day (QID) | ORAL | 3 refills | Status: DC | PRN

## 2016-12-22 MED FILL — KETOROLAC 10 MG TABLET: 10 | 27 days supply | Qty: 21 | Fill #1

## 2016-12-22 NOTE — Progress Notes (Signed)
[facility]    Provider:  Dr Jaynee Eagles Referring Provider: Debbrah Alar, NP Primary Care Physician:  Debbrah Alar, NP  CC:  Headache, passing out, disorientation and twitches  Interval history 12/22/2016: Patient is here for altered awareness started on topiramate which would help with concomitant migraines. MRI shows Prior suboccipital decompression surgery for Chiari malformation and the brain is otherwise normal and there are no acute findings. The patient was first seen she complained of of altered awareness, with a previous eeg showing focal slowing over the left temporal region which may indicate seizure focus but is not technically epileptiform and non-seizure activity on EEG. A prolonged sleep deprived EEG follow-up here in our office was normal. Patient then reported more episodes of altered awareness and she was referred to the prolonged inpatient  electronic monitoring unit with Dr. Ernest Haber to evaluate whether these were nonorganic i.e. Pseudo-seizures or nonepileptic events did not show eeg correlate even during her typical altered awareness events. She was continued on topiramate and gabapentin. Repeat MRI in 2018 showed no change since 2005 of MRI. Patient was admitted to the electronic monitoring unit, she had 2 clinical events of unresponsiveness, no abnormal movements or behaviors except left-sided shaking, there was no EEG corresponding abnormality, she was DC'd on higher dose of topiramate for migraine prevention.  She is improved on the Qudexy 200mg .   Interval history 08/31/2016: Extended 90 minute EEG sleep deprived was normal. She did not have the 3-day eeg completed or the MRI of the brain completed that I ordered at last appt. She has episodes of altered awareness more frequently now, happening more frequently, varies but often 2-3x a week. She has chronic hypotension, but when the episodes occurs her BP and pulse increase. She was helping a  patient last week, she became altered, was awake and not answering questions, she does not remember it, she had a left arm tremor, they sat her down and she then remembered sitting on a bench, episode lasted for 7 minutes, she had no idea where she was for a few minutes, she knew what she wanted to say but could not get it out, she felt panicked and was really frightened. She is crying in the office today. She should not be driving, discussed. Then after the episodes she gets a horrible headache. Headaches are every other day. She has had this one since Saturday. Topiramate helps with the headaches. Had a Hysterectomy in the past. Decreased appetite on the Topamax. She is having new numbness on the left side of the face, shooting pain, she is having left arm numbness.   HPI:  Kim Pham is a 36 y.o. female here as a referral from Dr. Inda Castle for headache and syncopal episodes.PMHx migraines and chiari malformation s/p decompression surgery.  In 2005 she was "randomly passing out" and having headaches and she was finding herself on the floor. A mild chiari malformation of 50mm was found and she had decompression surgery which helped. This past June her headaches started changing and worsening. Migraines usually on the right side but lately she has intense debilitating pain on the top of her head. 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 She does not know where she is when these episodes resolve. She does urinate.defecate, she works at a nursing home. This past week she has had 3 of them but usually 1-2x a week at work and at home. She gets disoriented when driving and she is losing time driving. She is losing words. No triggers at work,  no significant stress. Sometimes the episodes start with a headache sometimes they do not. She feels anxious before it comes on, she feels jittery. She is having the headaches almost every day. She cannot have children and she had  A partial hysterectomy. Father with migraines. PGF with seizures. No other focal neurologic deficits or complaints, associates symptoms or modifying factors.    Headaches: Imitrex  Reviewed notes, labs and imaging from outside physicians, which showed:   Impression: This awake and asleepEEG is abnormal due to occasional focal slowing over the left temporal region.  Clinical Correlation of the above findings indicates focal cerebral dysfunction over the left temporal region, suggestive of underlying structural or physiologic abnormality. The absence of epileptiform discharges does not exclude a clinical diagnosis of epilepsy. Clinical correlation is advised.  CT head 12/2015 (personally reviewed images): Brain: No evidence of infarction, hemorrhage, hydrocephalus, extra-axial collection or mass lesion/mass effect.  Vascular: No hyperdense vessel or unexpected calcification.  Skull: Suboccipital craniotomy noted without other significant abnormality.  Sinuses/Orbits: Visualized portions unremarkable.  Other: None  IMPRESSION: No evidence of intracranial abnormality.  Clinical data: Syncope; headache; fell and hit head  MRI BRAIN WITHOUT CONTRAST 2005 The ventricles are normal in size. There is no infarct or mass. White matter has normal signal. There is no evidence of intracranial blood or fluid collection.  The cerebellar tonsils are approximately 6 mm below the foramen magnum compatible with a mild Chiari malformation.  IMPRESSION  Mild Chiari malformation. No acute intracranial abnormality.  CBC, BMP nomral 01/11/2015  Review of Systems: Patient complains of symptoms per HPI as well as the following symptoms fatigue, confusion, weakness,  anxiety, allergies. Pertinent negatives per HPI. All others negative.  Social History   Social History  . Marital status: Married    Spouse name: N/A  . Number of children: 1  . Years of education: Assoc   Occupational History  . nurse    Social History Main Topics  . Smoking status: Former Smoker    Packs/day: 1.00    Years: 10.00    Types: Cigarettes    Quit date: 04/04/2004  . Smokeless tobacco: Never Used  . Alcohol use No  . Drug use: No  . Sexual activity: Not on file   Other Topics Concern  . Not on file   Social History Narrative   Works as an Therapist, sports at [employer]   Married   1 son- born 2004 has autism   Enjoys exercise, special olympics with son   Drinks 4-5 cups of coffee a day     Family History  Problem Relation Age of Onset  . Hypertension Mother   . CAD Mother   . Breast cancer Mother   . Migraines Mother   . Gout Mother   . Hypertension Father   . CAD Father   . CVA Father   . Sjogren's syndrome Sister   . Rheum arthritis Maternal Grandmother     Past Medical History:  Diagnosis Date  . Anxiety   . Chiari malformation   . Endometriosis   . GERD (gastroesophageal reflux disease)    tums otc, prevacid  prn  . Headache   . Low blood pressure    pt states history of low blood pressure-fainted 12/2010-instructed to move slowly  . SLE (systemic lupus erythematosus) ([place]) 03/26/2016    Past Surgical History:  Procedure Laterality Date  . ABDOMINAL HYSTERECTOMY  2007  . CYSTOSCOPY  2007  . DIAGNOSTIC LAPAROSCOPY  2005  . ROTATOR CUFF REPAIR  2010   right shoulder  . SALPINGOOPHORECTOMY  04/16/2011   Procedure: SALPINGO OOPHERECTOMY;  Surgeon: Lovenia Kim, MD;  Location: [facility];  Service: Gynecology;  Laterality: Right;  . skull decompression surgery 2005      Current Outpatient Prescriptions  Medication Sig Dispense Refill  . hydroxychloroquine (PLAQUENIL) 200 MG tablet Take 1 tablet (200 mg total) by mouth 2 (two) times  daily. Monday through Friday 40 tablet 2  . ketorolac (TORADOL) 10 MG tablet Take 1 tablet at onset of headache, may take 3 times daily if needed. Do not take for more than 3 days in a row  3  . pantoprazole (PROTONIX) 40 MG tablet Take 1 tablet (40 mg total) by mouth daily. 30 tablet 3  . Topiramate ER (QUDEXY XR) 200 MG CS24 sprinkle capsule Take 1 capsule by mouth at bedtime.  2  . venlafaxine XR (EFFEXOR XR) 37.5 MG 24 hr capsule Start 1 tablet by mouth once daily for 1 week, then increase to 2 tabs once daily on week two 60 capsule 0   No current facility-administered medications for this visit.    Facility-Administered Medications Ordered in Other Visits  Medication Dose Route Frequency Provider Last Rate Last Dose  . gadopentetate dimeglumine (MAGNEVIST) injection 15 mL  15 mL Intravenous Once PRN Melvenia Beam, MD        Allergies as of 12/22/2016 - Review Complete 11/24/2016  Allergen Reaction Noted  . Lexapro [escitalopram oxalate]  08/16/2016    Vitals: There were no vitals taken for this visit. Last Weight:  Wt Readings from Last 1 Encounters:  11/24/16 158 lb (71.7 kg)   Last Height:   Ht Readings from Last 1 Encounters:  11/24/16 5\' 9"  (1.753 m)   Physical exam: Exam: Gen: NAD, conversant, well nourised, obese, well groomed                     CV: RRR, no MRG. No Carotid Bruits. No peripheral edema, warm, nontender Eyes: Conjunctivae clear without exudates or hemorrhage  Neuro: Detailed Neurologic Exam  Speech:    Speech is normal; fluent and spontaneous with normal comprehension.  Cognition:    The patient is oriented to person, place, and time;     recent and remote memory intact;     language fluent;     normal attention, concentration,     fund of knowledge Cranial Nerves:    The pupils are equal, round, and reactive to light. The fundi are normal and spontaneous venous pulsations are present. Visual fields are full to finger confrontation.  Extraocular movements are intact. Trigeminal sensation is intact and the muscles of mastication are normal. The face is symmetric. The palate elevates in the midline. Hearing intact. Voice is normal. Shoulder shrug is normal. The tongue has normal motion without fasciculations.   Coordination:    Normal finger to nose and heel to shin. Normal rapid alternating movements.   Gait:    Heel-toe and tandem gait are normal.   Motor Observation:    No asymmetry, no atrophy, and no involuntary movements noted. Tone:  Normal muscle tone.    Posture:    Posture is normal. normal erect    Strength:    Strength is V/V in the upper and lower limbs.      Sensation: intact to LT     Reflex Exam:  DTR's:    Deep tendon reflexes in the upper and lower extremities are normal bilaterally.   Toes:    The toes are downgoing bilaterally.   Clonus:    Clonus is absent.  Assessment/Plan:36 year old with episodes of altered awareness, 2-3 times a week can last up to 7 minutes, more "zoning out", need to evaluate for focal onset seizures. She also has a PMHx of migraines. She was started on Topiramate at last appointment as she has a hx of migraines, on 150mg  ER Topiramate daily and cannot tolerate furthe rincrease. Her mood is labile and she is crying in the office today, lots of stress in her life.  Prolonged inpatient  electronic monitoring unit with Dr. Ernest Haber to evaluate whether these were nonorganic i.e. pseudoseizures or nonepileptic events did not show eeg correlate even during her typical altered awareness events likely stress related  Toradol for acute management of migraines and cont Qudexy, stable or improved.  Electronic monitoring unit with video EEG capture 2 events that did not have an EEG correlate, nonepileptic events possibly migrainous:  Ernest Haber at [facility]   90-minute sleep deprived EEG was normal. As far as your medications are concerned, I would like to  suggest: Continue Topiramate Qudexy 200mg  daily (discussed teratogenicity and using backup birth control, do not get pregnant). Will start Gabapentin 300mg  three times a day for migraine but this is also an AED.  Patient is unable to drive, operate heavy machinery, perform activities at heights or participate in water activities until 6 months free of altered awareness. Discussed again today, she should not drive.   Discussed Patients with epilepsy have a small risk of sudden unexpected death, a condition referred to as sudden unexpected death in epilepsy (SUDEP). SUDEP is defined specifically as the sudden, unexpected, witnessed or unwitnessed, nontraumatic and nondrowning death in patients with epilepsy with or without evidence for a seizure, and excluding documented status epilepticus, in which post mortem examination does not reveal a structural or toxicologic cause for death   Call 911 for any other episodes, discussed risk of generalization to convulsive seizures  Sarina Ill, MD Sarina Ill, MD  [facility] [address] [place], [place] [address]  Phone [phone] Fax [phone]  A total of 35 minutes was spent face-to-face with this patient. Over half this time was spent on counseling patient on the migraines with aura, non-epileptic stress-related altered awareness diagnosis and different diagnostic and therapeutic options available.

## 2016-12-22 NOTE — Patient Instructions (Addendum)
Remember to drink plenty of fluid, eat healthy meals and do not skip any meals. Try to eat protein with a every meal and eat a healthy snack such as fruit or nuts in between meals. Try to keep a regular sleep-wake schedule and try to exercise daily, particularly in the form of walking, 20-30 minutes a day, if you can.   As far as your medications are concerned, I would like to suggest: Continue Qudexy and Toradol  I would like to see you back in 1 year, sooner if we need to. Please call us with any interim questions, concerns, problems, updates or refill requests.   Our phone number is [phone]. We also have an after hours call service for urgent matters and there is a physician on-call for urgent questions. For any emergencies you know to call 911 or go to the nearest emergency room

## 2017-01-06 ENCOUNTER — Ambulatory Visit: Payer: BLUE CROSS/BLUE SHIELD | Admitting: Rheumatology

## 2017-01-22 ENCOUNTER — Other Ambulatory Visit: Payer: Self-pay | Admitting: Rheumatology

## 2017-01-24 NOTE — Telephone Encounter (Signed)
Last Visit: 11/24/16 Next Visit: 03/28/17 Labs: 11/19/16 WNL PLQ Eye Exam: was scheduled for 12/11/16 and has not been received. Left message for patient to call the office with this information.

## 2017-01-26 NOTE — Telephone Encounter (Signed)
Fu on eye exam

## 2017-01-26 NOTE — Telephone Encounter (Signed)
Attempted to contact the patient and left message for patient to call the office. Need PLQ eye exam information.   Last Visit: 11/24/16 Next Visit: 03/28/17 Labs: 11/19/16 WNL  Okay to refill 30 day supply PLQ?

## 2017-01-26 NOTE — Telephone Encounter (Signed)
Attempted to contact the patient and left message for patient to call the office.  

## 2017-01-26 NOTE — Telephone Encounter (Signed)
Patient lmom returning Andrea's call.

## 2017-01-31 ENCOUNTER — Encounter: Payer: Self-pay | Admitting: Neurology

## 2017-02-01 ENCOUNTER — Telehealth: Payer: Self-pay | Admitting: Neurology

## 2017-02-01 NOTE — Telephone Encounter (Signed)
Kim Pham, patient would like a letter stating the following (see below). She is taking some time off from work but it appears she has a 30-day notice, we can say patient is under my care and I recommend she take a leave of absence at this time for a neurologic workup thanks    Is there any way you can write a brief note just stating that its recommended that I not work the 30 day notice? Let me know. Thanks.     [name]

## 2017-02-02 NOTE — Telephone Encounter (Signed)
I have written up the letter and printed for Dr. Jaynee Eagles to sign.

## 2017-02-24 ENCOUNTER — Other Ambulatory Visit: Payer: Self-pay | Admitting: Rheumatology

## 2017-02-24 NOTE — Telephone Encounter (Addendum)
Last Visit: 11/24/16 Next Visit: 03/28/17 Labs: 11/19/16 WNL PLQ Eye Exam:   Left message to advise patient we need documentation of her PLQ Eye exam.   Okay to refill 30 day supply PLQ?

## 2017-03-03 NOTE — Telephone Encounter (Signed)
ok 

## 2017-03-15 NOTE — Progress Notes (Deleted)
Office Visit Note  Patient: Kim Pham             Date of Birth: 01-11-1981           MRN: [id number]             PCP: Debbrah Alar, NP Referring: Debbrah Alar, NP Visit Date: 03/28/2017 Occupation: @GUAROCC @    Subjective:  No chief complaint on file.   History of Present Illness: Kim Pham is a 36 y.o. female ***   Activities of Daily Living:  Patient reports morning stiffness for *** {minute/hour:19697}.   Patient {ACTIONS;DENIES/REPORTS:21021675::"Denies"} nocturnal pain.  Difficulty dressing/grooming: {ACTIONS;DENIES/REPORTS:21021675::"Denies"} Difficulty climbing stairs: {ACTIONS;DENIES/REPORTS:21021675::"Denies"} Difficulty getting out of chair: {ACTIONS;DENIES/REPORTS:21021675::"Denies"} Difficulty using hands for taps, buttons, cutlery, and/or writing: {ACTIONS;DENIES/REPORTS:21021675::"Denies"}   No Rheumatology ROS completed.   PMFS History:  Patient Active Problem List   Diagnosis Date Noted  . Nonepileptic episode ([place]) 12/22/2016  . Sicca syndrome ([place]) 11/22/2016  . Raynaud's disease without gangrene 11/22/2016  . Hair loss 11/22/2016  . Photosensitivity 11/22/2016  . History of Chiari malformation 11/22/2016  . High risk medication use 11/22/2016  . Pain in joint, multiple sites 08/18/2016  . Fatigue 03/26/2016  . SLE (systemic lupus erythematosus) ([place]) 03/26/2016  . Depression with anxiety 03/26/2016  . Numbness and tingling 02/25/2016  . Transient alteration of awareness 01/01/2016  . Chiari malformation 12/19/2015    Past Medical History:  Diagnosis Date  . Anxiety   . Chiari malformation   . Endometriosis   . GERD (gastroesophageal reflux disease)    tums otc, prevacid prn  . Headache   . Low blood pressure    pt states history of low blood pressure-fainted 12/2010-instructed to move slowly  . SLE (systemic lupus erythematosus) ([place]) 03/26/2016    Family History  Problem Relation Age of Onset  . Hypertension  Mother   . CAD Mother   . Breast cancer Mother   . Migraines Mother   . Gout Mother   . Hypertension Father   . CAD Father   . CVA Father   . Sjogren's syndrome Sister   . Rheum arthritis Maternal Grandmother    Past Surgical History:  Procedure Laterality Date  . ABDOMINAL HYSTERECTOMY  2007  . CYSTOSCOPY  2007  . DIAGNOSTIC LAPAROSCOPY  2005  . ROTATOR CUFF REPAIR  2010   right shoulder  . SALPINGOOPHORECTOMY  04/16/2011   Procedure: SALPINGO OOPHERECTOMY;  Surgeon: Lovenia Kim, MD;  Location: [facility];  Service: Gynecology;  Laterality: Right;  . skull decompression surgery 2005     Social History   Social History Narrative   Works as an Therapist, sports at [employer]   Married   1 son- born 2004 has autism   Enjoys exercise, special olympics with son   Drinks 4-5 cups of coffee a day      Objective: Vital Signs: There were no vitals taken for this visit.   Physical Exam   Musculoskeletal Exam: ***  CDAI Exam: No CDAI exam completed.    Investigation: No additional findings. CBC Latest Ref Rng & Units 11/19/2016 08/16/2016 03/08/2016  WBC 3.8 - 10.8 K/uL 8.0 5.8 4.4  Hemoglobin 11.7 - 15.5 g/dL 12.9 13.1 13.3  Hematocrit 35.0 - 45.0 % 40.1 39.9 39.6  Platelets 140 - 400 K/uL 278 274.0 240.0   CMP Latest Ref Rng & Units 11/19/2016 08/23/2016 03/08/2016  Glucose 65 - 99 mg/dL 81 88 88  BUN 7 - 25 mg/dL 12 9 8  Creatinine 0.50 - 1.10 mg/dL 0.75 0.72 0.72  Sodium 135 - 146 mmol/L 138 141 140  Potassium 3.5 - 5.3 mmol/L 4.3 4.1 4.0  Chloride 98 - 110 mmol/L 111(H) 109 108  CO2 20 - 31 mmol/L 18(L) 20 25  Calcium 8.6 - 10.2 mg/dL 9.5 9.4 9.7  Total Protein 6.1 - 8.1 g/dL 6.7 6.9 7.1  Total Bilirubin 0.2 - 1.2 mg/dL 0.4 0.3 0.4  Alkaline Phos 33 - 115 U/L 56 62 48  AST 10 - 30 U/L 17 19 17   ALT 6 - 29 U/L 15 18 20     Imaging: No results found.  Speciality Comments: No specialty comments available.    Procedures:  No procedures performed Allergies:  Effexor [venlafaxine] and Lexapro [escitalopram oxalate]   Assessment / Plan:     Visit Diagnoses: No diagnosis found.    Orders: No orders of the defined types were placed in this encounter.  No orders of the defined types were placed in this encounter.   Face-to-face time spent with patient was *** minutes. 50% of time was spent in counseling and coordination of care.  Follow-Up Instructions: No Follow-up on file.   Earnestine Mealing, CMA  Note - This record has been created using Editor, commissioning.  Chart creation errors have been sought, but may not always  have been located. Such creation errors do not reflect on  the standard of medical care.

## 2017-03-28 ENCOUNTER — Ambulatory Visit: Payer: BLUE CROSS/BLUE SHIELD | Admitting: Rheumatology

## 2017-04-08 ENCOUNTER — Encounter: Payer: Self-pay | Admitting: Neurology

## 2017-05-16 ENCOUNTER — Encounter: Payer: Self-pay | Admitting: Rheumatology

## 2017-06-15 ENCOUNTER — Telehealth: Payer: Self-pay | Admitting: Rheumatology

## 2017-06-15 DIAGNOSIS — Z79899 Other long term (current) drug therapy: Secondary | ICD-10-CM

## 2017-06-15 NOTE — Telephone Encounter (Signed)
Left message on machine to advise patient she is due for labs and that I placed lab orders for [facility].

## 2017-06-15 NOTE — Telephone Encounter (Signed)
Patient called and scheduled a follow-up appointment with Lovena Le on 3/19.  Patient was last seen on 8/8 and states that if bloodwork is needed before her appointment she needs the orders sent to [facility].

## 2017-06-17 MED FILL — IBUPROFEN 600 MG TABLET: 600 | 4 days supply | Qty: 16 | Fill #0

## 2017-06-17 MED FILL — AMOXICILLIN 500 MG CAPSULE: 500 | 6 days supply | Qty: 25 | Fill #0

## 2017-06-17 MED FILL — HYDROCODON-APAP 5-325: 5-325 | 2 days supply | Qty: 10 | Fill #0

## 2017-06-20 ENCOUNTER — Telehealth: Payer: Self-pay | Admitting: Rheumatology

## 2017-06-20 ENCOUNTER — Encounter: Payer: Self-pay | Admitting: Neurology

## 2017-06-20 DIAGNOSIS — Z79899 Other long term (current) drug therapy: Secondary | ICD-10-CM

## 2017-06-20 NOTE — Telephone Encounter (Signed)
Patient called stating that she is planning to get her bloodwork done this Wednesday 3/6 at [facility] in [place].

## 2017-06-20 NOTE — Telephone Encounter (Signed)
Order's released

## 2017-06-21 MED ORDER — TOPIRAMATE ER 200 MG PO SPRINKLE CAP24: 1.0000 | EXTENDED_RELEASE_CAPSULE | Freq: Every day | ORAL | 4 refills | Status: DC

## 2017-06-22 NOTE — Progress Notes (Signed)
Office Visit Note  Patient: Kim Pham             Date of Birth: 1980-11-19           MRN: [id number]             PCP: Debbrah Alar, NP Referring: Debbrah Alar, NP Visit Date: 07/05/2017 Occupation: @GUAROCC @    Subjective:  Other (right shoulder pain )   History of Present Illness: Kim Pham is a 37 y.o. female with history of autoimmune disease.  She states she continues to have some arthralgias, fatigue and oral ulcers.  She has some dry eye symptoms.  She does have some rainouts phenomenon and photosensitivity.  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  She states for the last 6 months she has been having discomfort in her right shoulder which is been worse recently.  She has nocturnal pain when she sleeps on her right side.  Activities of Daily Living:  Patient reports morning stiffness for 30 minutes.   Patient Reports nocturnal pain.  Difficulty dressing/grooming: Reports Difficulty climbing stairs: Reports feels discomfort in the ankles. Difficulty getting out of chair: Denies Difficulty using hands for taps, buttons, cutlery, and/or writing: Reports decreased grip strength   Review of Systems  Constitutional: Positive for fatigue. Negative for night sweats, weight gain, weight loss and weakness.  HENT: Positive for mouth sores and mouth dryness. Negative for trouble swallowing, trouble swallowing and nose dryness.   Eyes: Positive for dryness. Negative for pain, redness and visual disturbance.  Respiratory: Negative for cough, shortness of breath and difficulty breathing.   Cardiovascular: Negative for chest pain, palpitations, hypertension, irregular heartbeat and swelling in legs/feet.  Gastrointestinal: Positive for nausea. Negative for blood in stool, constipation and diarrhea.  Endocrine: Positive for increased urination.  Genitourinary: Negative for pelvic pain and vaginal dryness.  Musculoskeletal: Positive  for arthralgias, joint pain and morning stiffness. Negative for joint swelling, myalgias, muscle weakness, muscle tenderness and myalgias.  Skin: Positive for color change and hair loss. Negative for rash, redness, skin tightness, ulcers and sensitivity to sunlight.  Allergic/Immunologic: Negative for susceptible to infections.  Neurological: Positive for headaches. Negative for dizziness, numbness, memory loss and night sweats.  Hematological: Negative for bruising/bleeding tendency and swollen glands.  Psychiatric/Behavioral: Negative for depressed mood, confusion and sleep disturbance. The patient is not nervous/anxious.     PMFS History:  Patient Active Problem List   Diagnosis Date Noted  . Nonepileptic episode ([place]) 12/22/2016  . Sicca syndrome ([place]) 11/22/2016  . Raynaud's disease without gangrene 11/22/2016  . Hair loss 11/22/2016  . Photosensitivity 11/22/2016  . History of Chiari malformation 11/22/2016  . High risk medication use 11/22/2016  . Pain in joint, multiple sites 08/18/2016  . Fatigue 03/26/2016  . SLE (systemic lupus erythematosus) ([place]) 03/26/2016  . Depression with anxiety 03/26/2016  . Numbness and tingling 02/25/2016  . Transient alteration of awareness 01/01/2016  . Chiari malformation 12/19/2015    Past Medical History:  Diagnosis Date  . Anxiety   . Chiari malformation   . Endometriosis   . GERD (gastroesophageal reflux disease)    tums otc, prevacid prn  . Headache   . Low blood pressure    pt states history of low blood pressure-fainted 12/2010-instructed to move slowly  . SLE (systemic lupus erythematosus) ([place]) 03/26/2016    Family History  Problem Relation Age of Onset  . Hypertension Mother   . CAD Mother   .  Breast cancer Mother   . Migraines Mother   . Gout Mother   . Hypertension Father   . CAD Father   . CVA Father   . Sjogren's syndrome Sister   . Rheum arthritis Maternal Grandmother   . Autism Son   . ADD / ADHD Son    Past  Surgical History:  Procedure Laterality Date  . ABDOMINAL HYSTERECTOMY  2007  . CYSTOSCOPY  2007  . DIAGNOSTIC LAPAROSCOPY  2005  . ROTATOR CUFF REPAIR  2010   right shoulder  . SALPINGOOPHORECTOMY  04/16/2011   Procedure: SALPINGO OOPHERECTOMY;  Surgeon: Lovenia Kim, MD;  Location: [facility];  Service: Gynecology;  Laterality: Right;  . skull decompression surgery 2005     Social History   Social History Narrative   Works as an Therapist, sports at [employer]   Married   1 son- born 2004 has autism   Enjoys exercise, special olympics with son   Drinks 4-5 cups of coffee a day      Objective: Vital Signs: BP 109/75 (BP Location: Left Arm, Patient Position: Sitting, Cuff Size: Normal)   Pulse 73   Resp 15   Ht 5' 9"  (1.753 m)   Wt 161 lb (73 kg)   BMI 23.78 kg/m    Physical Exam  Constitutional: She is oriented to person, place, and time. She appears well-developed and well-nourished.  HENT:  Head: Normocephalic and atraumatic.  Eyes: Conjunctivae and EOM are normal.  Neck: Normal range of motion.  Cardiovascular: Normal rate, regular rhythm, normal heart sounds and intact distal pulses.  Pulmonary/Chest: Effort normal and breath sounds normal.  Abdominal: Soft. Bowel sounds are normal.  Lymphadenopathy:    She has no cervical adenopathy.  Neurological: She is alert and oriented to person, place, and time.  Skin: Skin is warm and dry. Capillary refill takes 2 to 3 seconds.  Hair thinning was noted.  Psychiatric: She has a normal mood and affect. Her behavior is normal.  Nursing note and vitals reviewed.    Musculoskeletal Exam: C-spine, thoracic and lumbar spine good range of motion with no discomfort.  She had painful range of motion of her right shoulder with painful on abduction and external rotation.  Left shoulder joint was in good range of motion without discomfort.  Bilateral elbows, wrist joints, MCPs, PIPs and DIPs are good range of motion with no synovitis.  Good  range of motion of bilateral hip joints knee joints ankles MTPs PIPs without any swelling or synovitis.  CDAI Exam: No CDAI exam completed.    Investigation: No additional findings. CBC Latest Ref Rng & Units 11/19/2016 08/16/2016 03/08/2016  WBC 3.8 - 10.8 K/uL 8.0 5.8 4.4  Hemoglobin 11.7 - 15.5 g/dL 12.9 13.1 13.3  Hematocrit 35.0 - 45.0 % 40.1 39.9 39.6  Platelets 140 - 400 K/uL 278 274.0 240.0   CMP Latest Ref Rng & Units 11/19/2016 08/23/2016 03/08/2016  Glucose 65 - 99 mg/dL 81 88 88  BUN 7 - 25 mg/dL 12 9 8   Creatinine 0.50 - 1.10 mg/dL 0.75 0.72 0.72  Sodium 135 - 146 mmol/L 138 141 140  Potassium 3.5 - 5.3 mmol/L 4.3 4.1 4.0  Chloride 98 - 110 mmol/L 111(H) 109 108  CO2 20 - 31 mmol/L 18(L) 20 25  Calcium 8.6 - 10.2 mg/dL 9.5 9.4 9.7  Total Protein 6.1 - 8.1 g/dL 6.7 6.9 7.1  Total Bilirubin 0.2 - 1.2 mg/dL 0.4 0.3 0.4  Alkaline Phos 33 - 115  U/L 56 62 48  AST 10 - 30 U/L 17 19 17   ALT 6 - 29 U/L 15 18 20     Imaging: Xr Shoulder Right  Result Date: 07/05/2017 No glenohumeral joint space narrowing was noted.  No chondrocalcinosis was noted.  No acromioclavicular joint space narrowing was noted. Impression: Shoulder joint x-ray was unremarkable.   Speciality Comments: No specialty comments available.    Procedures:  Large Joint Inj: R glenohumeral on 07/05/2017 5:04 PM Indications: pain Details: 27 G 1.5 in needle, posterior approach  Arthrogram: No  Medications: 1 mL lidocaine 1 %; 40 mg triamcinolone acetonide 40 MG/ML Aspirate: 0 mL Outcome: tolerated well, no immediate complications Procedure, treatment alternatives, risks and benefits explained, specific risks discussed. Consent was given by the patient. Immediately prior to procedure a time out was called to verify the correct patient, procedure, equipment, support staff and site/side marked as required. Patient was prepped and draped in the usual sterile fashion.     Allergies: Effexor [venlafaxine] and  Lexapro [escitalopram oxalate]   Assessment / Plan:     Visit Diagnoses: Autoimmune disease ([place]) - 1:320 homogeneous, h/oFatigue, oral ulcers, sicca symptoms, arthralgias, photosensitivity, Raynaud's, hair loss.  She states she continues to have some hair loss and sicca symptoms.  She also has history of Raynauds and oral ulcers which has been bothersome.  Overall her symptoms have been better since she started taking Plaquenil but not completely resolved.  She still has significant fatigue.  She has changed her job where she works during the daytime which has been helpful.  High risk medication use - PLQ 200 mg by mouth BID M-F.  Her labs have been stable we will continue to monitor her labs closely.  Last eye exam per patient was in August 2018.  We do not have a report from the ophthalmologist. Other fatigue: She is having significant fatigue.  I will obtain CK, TSH, ESR, vitamin D.  Chronic right shoulder pain: Most likely bursitis or tendinitis.  The x-ray was unremarkable.  Per request right shoulder joint was injected with cortisone as described above.  She tolerated the procedure well.  If her symptoms persist she is supposed to notify me.  Sicca syndrome, unspecified ([place]): She has been using over-the-counter products which is been helpful.  Raynaud's syndrome without gangrene: She complains of Raynaud's phenomenon.  She has decreased capillary refill otherwise she is doing well.  Hair loss: I have noticed improvement in her hair thinning.  Photosensitivity: Use of sunscreen was discussed.  Other medical problems are listed as follows:  History of Chiari malformation  History of gastroesophageal reflux (GERD)  History of depression  Chronic right shoulder pain - Plan: XR Shoulder Right    Orders: Orders Placed This Encounter  Procedures  . Large Joint Inj  . XR Shoulder Right  . CBC with Differential/Platelet  . COMPLETE METABOLIC PANEL WITH GFR  . Urinalysis, Routine w  reflex microscopic  . CK  . TSH  . VITAMIN D 25 Hydroxy (Vit-D Deficiency, Fractures)  . Sedimentation rate  . C3 and C4  . ANA  . Anti-DNA antibody, double-stranded   Meds ordered this encounter  Medications  . hydroxychloroquine (PLAQUENIL) 200 MG tablet    Sig: TAKE ONE TABLET TWO TIMES A DAY [place].    Dispense:  40 tablet    Refill:  0    Face-to-face time spent with patient was 30 minutes.  Greater than 50% of time was spent in counseling and  coordination of care.  Follow-Up Instructions: Return in about 5 months (around 12/05/2017) for Autoimmune disease.   Bo Merino, MD  Note - This record has been created using Editor, commissioning.  Chart creation errors have been sought, but may not always  have been located. Such creation errors do not reflect on  the standard of medical care.

## 2017-06-23 ENCOUNTER — Encounter: Payer: Self-pay | Admitting: Rheumatology

## 2017-06-24 ENCOUNTER — Telehealth: Payer: Self-pay | Admitting: Rheumatology

## 2017-06-24 NOTE — Telephone Encounter (Signed)
Lab orders faxed.

## 2017-06-24 NOTE — Telephone Encounter (Signed)
Patient calling to request lab orders to be sent to [facility]. She has to do labs there for insurance to pay. Fax# [phone]. Patient going at lunch time.

## 2017-06-27 ENCOUNTER — Encounter: Payer: Self-pay | Admitting: Neurology

## 2017-06-29 MED ORDER — SUMATRIPTAN SUCCINATE 100 MG PO TABS: ORAL_TABLET | ORAL | 2 refills | Status: AC

## 2017-06-29 MED ORDER — SUMATRIPTAN SUCCINATE 100 MG PO TABS: ORAL_TABLET | ORAL | 2 refills | Status: DC

## 2017-06-29 NOTE — Telephone Encounter (Signed)
Per Dr. Jaynee Eagles, order Sumatriptan 100 mg PO. Take 1 tablet at onset of migraine. May repeat in 2 hours if headache persists or recurs. Do not take more than 2 doses in 24 hours.   Prescription printed & ready for MD signature.

## 2017-07-05 ENCOUNTER — Encounter: Payer: Self-pay | Admitting: Rheumatology

## 2017-07-05 ENCOUNTER — Ambulatory Visit: Payer: Commercial Managed Care - PPO | Admitting: Rheumatology

## 2017-07-05 ENCOUNTER — Ambulatory Visit (INDEPENDENT_AMBULATORY_CARE_PROVIDER_SITE_OTHER): Payer: Self-pay

## 2017-07-05 VITALS — BP 109/75 | HR 73 | Resp 15 | Ht 69.0 in | Wt 161.0 lb

## 2017-07-05 DIAGNOSIS — R5383 Other fatigue: Secondary | ICD-10-CM | POA: Diagnosis not present

## 2017-07-05 DIAGNOSIS — Z8719 Personal history of other diseases of the digestive system: Secondary | ICD-10-CM

## 2017-07-05 DIAGNOSIS — Z8669 Personal history of other diseases of the nervous system and sense organs: Secondary | ICD-10-CM | POA: Diagnosis not present

## 2017-07-05 DIAGNOSIS — G8929 Other chronic pain: Secondary | ICD-10-CM

## 2017-07-05 DIAGNOSIS — L568 Other specified acute skin changes due to ultraviolet radiation: Secondary | ICD-10-CM | POA: Diagnosis not present

## 2017-07-05 DIAGNOSIS — Z79899 Other long term (current) drug therapy: Secondary | ICD-10-CM | POA: Diagnosis not present

## 2017-07-05 DIAGNOSIS — M3500 Sicca syndrome, unspecified: Secondary | ICD-10-CM

## 2017-07-05 DIAGNOSIS — L659 Nonscarring hair loss, unspecified: Secondary | ICD-10-CM | POA: Diagnosis not present

## 2017-07-05 DIAGNOSIS — M25511 Pain in right shoulder: Secondary | ICD-10-CM | POA: Diagnosis not present

## 2017-07-05 DIAGNOSIS — M359 Systemic involvement of connective tissue, unspecified: Secondary | ICD-10-CM

## 2017-07-05 DIAGNOSIS — D8989 Other specified disorders involving the immune mechanism, not elsewhere classified: Secondary | ICD-10-CM

## 2017-07-05 DIAGNOSIS — Z8659 Personal history of other mental and behavioral disorders: Secondary | ICD-10-CM | POA: Diagnosis not present

## 2017-07-05 DIAGNOSIS — I7300 Raynaud's syndrome without gangrene: Secondary | ICD-10-CM | POA: Diagnosis not present

## 2017-07-05 MED ORDER — TRIAMCINOLONE ACETONIDE 40 MG/ML IJ SUSP
40.0000 mg | INTRAMUSCULAR | Status: AC | PRN
  Administered 2017-07-05: 40 mg via INTRA_ARTICULAR

## 2017-07-05 MED ORDER — HYDROXYCHLOROQUINE SULFATE 200 MG PO TABS: ORAL_TABLET | ORAL | 0 refills | Status: DC

## 2017-07-05 MED ORDER — LIDOCAINE HCL 1 % IJ SOLN
1.0000 mL | INTRAMUSCULAR | Status: AC | PRN
  Administered 2017-07-05: 1 mL

## 2017-07-08 ENCOUNTER — Ambulatory Visit: Payer: Commercial Managed Care - PPO | Admitting: Family Medicine

## 2017-07-08 ENCOUNTER — Ambulatory Visit: Payer: Self-pay | Admitting: *Deleted

## 2017-07-08 ENCOUNTER — Encounter: Payer: Self-pay | Admitting: Family Medicine

## 2017-07-08 VITALS — BP 126/62 | HR 76 | Temp 98.6°F | Ht 69.0 in | Wt 153.0 lb

## 2017-07-08 DIAGNOSIS — R197 Diarrhea, unspecified: Secondary | ICD-10-CM | POA: Diagnosis not present

## 2017-07-08 HISTORY — DX: Diarrhea, unspecified: R19.7

## 2017-07-08 LAB — CBC WITH DIFFERENTIAL/PLATELET
Basophils Absolute: 52 cells/uL (ref 0–200)
Basophils Relative: 1 %
Eosinophils Absolute: 52 cells/uL (ref 15–500)
Eosinophils Relative: 1 %
HCT: 41.2 % (ref 35.0–45.0)
Hemoglobin: 13.8 g/dL (ref 11.7–15.5)
Lymphs Abs: 2028 cells/uL (ref 850–3900)
MCH: 28.8 pg (ref 27.0–33.0)
MCHC: 33.5 g/dL (ref 32.0–36.0)
MCV: 86 fL (ref 80.0–100.0)
MPV: 10.5 fL (ref 7.5–12.5)
Monocytes Relative: 6 %
Neutro Abs: 2756 cells/uL (ref 1500–7800)
Neutrophils Relative %: 53 %
Platelets: 266 10*3/uL (ref 140–400)
RBC: 4.79 10*6/uL (ref 3.80–5.10)
RDW: 12.1 % (ref 11.0–15.0)
Total Lymphocyte: 39 %
WBC mixed population: 312 cells/uL (ref 200–950)
WBC: 5.2 10*3/uL (ref 3.8–10.8)

## 2017-07-08 LAB — COMPLETE METABOLIC PANEL WITH GFR
AG Ratio: 1.8 (calc) (ref 1.0–2.5)
ALT: 22 U/L (ref 6–29)
AST: 19 U/L (ref 10–30)
Albumin: 5.2 g/dL — ABNORMAL HIGH (ref 3.6–5.1)
Alkaline phosphatase (APISO): 55 U/L (ref 33–115)
BUN: 11 mg/dL (ref 7–25)
CO2: 24 mmol/L (ref 20–32)
Calcium: 10 mg/dL (ref 8.6–10.2)
Chloride: 106 mmol/L (ref 98–110)
Creat: 0.78 mg/dL (ref 0.50–1.10)
GFR, Est African American: 113 mL/min/{1.73_m2} (ref >=60)
GFR, Est Non African American: 97 mL/min/{1.73_m2} (ref >=60)
Globulin: 2.9 g/dL (calc) (ref 1.9–3.7)
Glucose, Bld: 83 mg/dL (ref 65–99)
Potassium: 3.5 mmol/L (ref 3.5–5.3)
Sodium: 138 mmol/L (ref 135–146)
Total Bilirubin: 0.5 mg/dL (ref 0.2–1.2)
Total Protein: 8.1 g/dL (ref 6.1–8.1)

## 2017-07-08 LAB — URINALYSIS, ROUTINE W REFLEX MICROSCOPIC
Bilirubin Urine: NEGATIVE
Glucose, UA: NEGATIVE
Hgb urine dipstick: NEGATIVE
Leukocytes, UA: NEGATIVE
Nitrite: NEGATIVE
Protein, ur: NEGATIVE
Specific Gravity, Urine: 1.018 (ref 1.001–1.03)
pH: 6.5 (ref 5.0–8.0)

## 2017-07-08 LAB — ANTI-NUCLEAR AB-TITER (ANA TITER): ANA Titer 1: 1:160 {titer} — ABNORMAL HIGH

## 2017-07-08 LAB — CK: Total CK: 53 U/L (ref 29–143)

## 2017-07-08 LAB — ANTI-DNA ANTIBODY, DOUBLE-STRANDED: ds DNA Ab: 1 IU/mL

## 2017-07-08 LAB — VITAMIN D 25 HYDROXY (VIT D DEFICIENCY, FRACTURES): Vit D, 25-Hydroxy: 25 ng/mL — ABNORMAL LOW (ref 30–100)

## 2017-07-08 LAB — ANA: Anti Nuclear Antibody(ANA): POSITIVE — AB

## 2017-07-08 LAB — SEDIMENTATION RATE: Sed Rate: 6 mm/h (ref 0–20)

## 2017-07-08 LAB — C3 AND C4
C3 Complement: 123 mg/dL (ref 83–193)
C4 Complement: 20 mg/dL (ref 15–57)

## 2017-07-08 LAB — TSH: TSH: 1.74 mIU/L

## 2017-07-08 MED ORDER — ONDANSETRON HCL 4 MG PO TABS: 4.0000 mg | ORAL_TABLET | Freq: Three times a day (TID) | ORAL | 0 refills | Status: DC | PRN

## 2017-07-08 NOTE — Patient Instructions (Signed)
Please try to stay well hydrated  Please try imodium for the diarrhea or pepto  Please let me know if your symptoms don't improve after 7 days.

## 2017-07-08 NOTE — Progress Notes (Signed)
Kim Pham - 37 y.o. female MRN [id number]  Date of birth: January 29, 1981  SUBJECTIVE:  Including CC & ROS.  Chief Complaint  Patient presents with  . Diarrhea    Kim Pham is a 37 y.o. female that is presenting with vomiting and diarrhea. Ongoing for two days. Admits to fevers and body aches. She is unable to tolerate food or liquids. Her husband had similar symptoms that lasted for six days. Denies any food triggers. Vomiting and diarrhea several times a day. Denies any travel. 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 No recent antibiotic use. Secretions have been non bloody.    Review of Systems  Constitutional: Positive for fever.  Cardiovascular: Negative for chest pain.  Gastrointestinal: Positive for diarrhea and vomiting. Negative for abdominal pain.    HISTORY: Past Medical, Surgical, Social, and Family History Reviewed & Updated per EMR.   Pertinent Historical Findings include:  Past Medical History:  Diagnosis Date  . Anxiety   . Chiari malformation   . Endometriosis   . GERD (gastroesophageal reflux disease)    tums otc, prevacid prn  . Headache   . Low blood pressure    pt states history of low blood pressure-fainted 12/2010-instructed to move slowly  . SLE (systemic lupus erythematosus) ([place]) 03/26/2016    Past Surgical History:  Procedure Laterality Date  . ABDOMINAL HYSTERECTOMY  2007  . CYSTOSCOPY  2007  . DIAGNOSTIC LAPAROSCOPY  2005  . ROTATOR CUFF REPAIR  2010   right shoulder  . SALPINGOOPHORECTOMY  04/16/2011   Procedure: SALPINGO OOPHERECTOMY;  Surgeon: Lovenia Kim, MD;  Location: [facility];  Service: Gynecology;  Laterality: Right;  . skull decompression surgery 2005      Allergies  Allergen Reactions  . Effexor [Venlafaxine] Nausea Only    insomnia  . Lexapro [Escitalopram Oxalate]     Insomnia, stomach upset    Family History  Problem Relation Age of Onset  . Hypertension Mother   . CAD Mother   . Breast  cancer Mother   . Migraines Mother   . Gout Mother   . Hypertension Father   . CAD Father   . CVA Father   . Sjogren's syndrome Sister   . Rheum arthritis Maternal Grandmother   . Autism Son   . ADD / ADHD Son      Social History   Socioeconomic History  . Marital status: Married    Spouse name: Not on file  . Number of children: 1  . Years of education: Assoc  . Highest education level: Not on file  Occupational History  . Occupation: nurse  Social Needs  . Financial resource strain: Not on file  . Food insecurity:    Worry: Not on file    Inability: Not on file  . Transportation needs:    Medical: Not on file    Non-medical: Not on file  Tobacco Use  . Smoking status: Former Smoker    Packs/day: 1.00    Years: 10.00    Pack years: 10.00    Types: Cigarettes    Last attempt to quit: 04/04/2004    Years since quitting: 13.2  . Smokeless tobacco: Never Used  Substance and Sexual Activity  . Alcohol use: No  . Drug use: No  . Sexual activity: Not on file  Lifestyle  . Physical activity:    Days per week: Not on file    Minutes per session: Not on file  .  Stress: Not on file  Relationships  . Social connections:    Talks on phone: Not on file    Gets together: Not on file    Attends religious service: Not on file    Active member of club or organization: Not on file    Attends meetings of clubs or organizations: Not on file    Relationship status: Not on file  . Intimate partner violence:    Fear of current or ex partner: Not on file    Emotionally abused: Not on file    Physically abused: Not on file    Forced sexual activity: Not on file  Other Topics Concern  . Not on file  Social History Narrative   Works as an Therapist, sports at [employer]   Married   1 son- born 2004 has autism   Enjoys exercise, Surveyor, mining with son   Drinks 4-5 cups of coffee a day      PHYSICAL EXAM:  VS: BP 126/62 (BP Location: Left Arm, Patient Position: Sitting, Cuff  Size: Normal)   Pulse 76   Temp 98.6 F (37 C) (Oral)   Ht 5\' 9"  (1.753 m)   Wt 153 lb (69.4 kg)   SpO2 97%   BMI 22.59 kg/m  Physical Exam Gen: NAD, alert, cooperative with exam,  ENT: normal lips, normal nasal mucosa,  Eye: normal EOM, normal conjunctiva and lids CV:  no edema, +2 pedal pulses   Resp: no accessory muscle use, non-labored,  GI: no masses or tenderness, no hernia  Skin: no rashes, no areas of induration  Neuro: normal tone, normal sensation to touch Psych:  normal insight, alert and oriented MSK: normal gait, normal strength      ASSESSMENT & PLAN:   Diarrhea Likely viral in nature. Has known sick contacts. 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 Would obtain stool culture if continues.

## 2017-07-08 NOTE — Assessment & Plan Note (Signed)
Likely viral in nature. Has known sick contacts. 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 Would obtain stool culture if continues.

## 2017-07-08 NOTE — Progress Notes (Signed)
Vitamin D is low.  Please call in vitamin D 50,000 units once a week for 3 months.  All other labs are negative.  ANA is pending.  Repeat vitamin D in 3 months.

## 2017-07-08 NOTE — Telephone Encounter (Signed)
Pt   Reports   Diarrhea    X  7   Within last  24  Hours Not  Drinking  Enough  Fluids   Dry  Mouth  Lightheaded  And  Dizzy .  No  Availability  At  [facility]  With [facility]     Reason for Disposition . [1] SEVERE diarrhea (e.g., 7 or more times / day more than normal) AND [2] present > 24 hours (1 day)  Answer Assessment - Initial Assessment Questions 1. DIARRHEA SEVERITY: "How bad is the diarrhea?" "How many extra stools have you had in the past 24 hours than normal?"    - MILD: Few loose or mushy BMs; increase of 1-3 stools over normal daily number of stools; mild increase in ostomy output.   - MODERATE: Increase of 4-6 stools daily over normal; moderate increase in ostomy output.   - SEVERE (or Worst Possible): Increase of 7 or more stools daily over normal; moderate increase in ostomy output; incontinence.     Severe   2. ONSET: "When did the diarrhea begin?" 4  Days  ago     *3. BM CONSISTENCY: "How loose or watery is the diarrhea?"      Watery  4. VOMITING: "Are you also vomiting?" If so, ask: "How many times in the past 24 hours?"      1 5. ABDOMINAL PAIN: "Are you having any abdominal pain?" If yes: "What does it feel like?" (e.g., crampy, dull, intermittent, constant)      intermittant  crampy   6. ABDOMINAL PAIN SEVERITY: If present, ask: "How bad is the pain?"  (e.g., Scale 1-10; mild, moderate, or severe)    - MILD (1-3): doesn't interfere with normal activities, abdomen soft and not tender to touch     - MODERATE (4-7): interferes with normal activities or awakens from sleep, tender to touch     - SEVERE (8-10): excruciating pain, doubled over, unable to do any normal activities       4 7. ORAL INTAKE: If vomiting, "Have you been able to drink liquids?" "How much fluids have you had in the past 24 hours?"     2   Liters     8. HYDRATION: "Any signs of dehydration?" (e.g., dry mouth [not just dry lips], too weak to stand, dizziness, new  weight loss) "When did you last urinate?"  Lightheaded          Dry  Mouth   Decreased  Urinary  Output   9. EXPOSURE: "Have you traveled to a foreign country recently?" "Have you been exposed to anyone with diarrhea?" "Could you have eaten any food that was spoiled?"       Husband  Has   Similar symptoms    10. OTHER SYMPTOMS: "Do you have any other symptoms?" (e.g., fever, blood in stool)         Low  Grade    Fever   Last  Pm   11. PREGNANCY: "Is there any chance you are pregnant?" "When was your last menstrual period?"  Hysterectomy  Protocols used: [facility]

## 2017-07-13 ENCOUNTER — Telehealth: Payer: Self-pay | Admitting: Rheumatology

## 2017-07-13 DIAGNOSIS — G8929 Other chronic pain: Secondary | ICD-10-CM

## 2017-07-13 DIAGNOSIS — M25511 Pain in right shoulder: Secondary | ICD-10-CM

## 2017-07-13 MED ORDER — VITAMIN D (ERGOCALCIFEROL) 1.25 MG (50000 UNIT) PO CAPS: 50000.0000 [IU] | ORAL_CAPSULE | ORAL | 0 refills | Status: DC

## 2017-07-13 NOTE — Telephone Encounter (Signed)
-----   Message from Bo Merino, MD sent at 07/08/2017  8:21 AM EDT ----- Vitamin D is low.  Please call in vitamin D 50,000 units once a week for 3 months.  All other labs are negative.  ANA is pending.  Repeat vitamin D in 3 months.

## 2017-07-13 NOTE — Telephone Encounter (Signed)
Patient states she received an injection in her right shoulder when she was in the office on 07/05/17. Patient states she is still having trouble with her shoulder. She states the injection only helped with muscle soreness for a couple of days. She is still having trouble with raising her hands above her head, ROM and laying on her right side. Please advise.

## 2017-07-13 NOTE — Telephone Encounter (Signed)
Patient returning your call. Patient request you leave a detailed message on phone if she does not answer. Patient is going back to work.

## 2017-07-13 NOTE — Telephone Encounter (Signed)
Please refer her to physical therapy.

## 2017-07-14 NOTE — Addendum Note (Signed)
Addended by: Carole Binning on: 07/14/2017 01:07 PM   Modules accepted: Orders

## 2017-07-14 NOTE — Telephone Encounter (Signed)
Patient advised Dr. Estanislado Pandy recommends PT. Patient is agreeable and referral has been placed.

## 2017-07-15 NOTE — Telephone Encounter (Signed)
CBC normal

## 2017-07-22 ENCOUNTER — Ambulatory Visit (HOSPITAL_BASED_OUTPATIENT_CLINIC_OR_DEPARTMENT_OTHER)
Admission: RE | Admit: 2017-07-22 | Discharge: 2017-07-22 | Disposition: A | Discharge status: Home / Self Care | Payer: Commercial Managed Care - PPO | Source: Ambulatory Visit | Attending: Family | Admitting: Family

## 2017-07-22 ENCOUNTER — Ambulatory Visit: Payer: Commercial Managed Care - PPO | Admitting: Family

## 2017-07-22 ENCOUNTER — Encounter: Payer: Self-pay | Admitting: Family

## 2017-07-22 VITALS — BP 114/67 | HR 73 | Temp 97.9°F | Resp 16 | Ht 69.0 in | Wt 158.6 lb

## 2017-07-22 DIAGNOSIS — R197 Diarrhea, unspecified: Secondary | ICD-10-CM | POA: Diagnosis not present

## 2017-07-22 DIAGNOSIS — R1013 Epigastric pain: Secondary | ICD-10-CM | POA: Diagnosis not present

## 2017-07-22 LAB — COMPREHENSIVE METABOLIC PANEL
AG Ratio: 1.9 (calc) (ref 1.0–2.5)
ALT: 14 U/L (ref 6–29)
AST: 14 U/L (ref 10–30)
Albumin: 4.2 g/dL (ref 3.6–5.1)
Alkaline phosphatase (APISO): 47 U/L (ref 33–115)
BUN: 11 mg/dL (ref 7–25)
CO2: 24 mmol/L (ref 20–32)
Calcium: 9.4 mg/dL (ref 8.6–10.2)
Chloride: 107 mmol/L (ref 98–110)
Creat: 0.7 mg/dL (ref 0.50–1.10)
Globulin: 2.2 g/dL (calc) (ref 1.9–3.7)
Glucose, Bld: 81 mg/dL (ref 65–99)
Potassium: 4.1 mmol/L (ref 3.5–5.3)
Sodium: 138 mmol/L (ref 135–146)
Total Bilirubin: 0.4 mg/dL (ref 0.2–1.2)
Total Protein: 6.4 g/dL (ref 6.1–8.1)

## 2017-07-22 LAB — CBC WITH DIFFERENTIAL/PLATELET
Basophils Absolute: 40 cells/uL (ref 0–200)
Basophils Relative: 0.6 %
Eosinophils Absolute: 40 cells/uL (ref 15–500)
Eosinophils Relative: 0.6 %
HCT: 36.8 % (ref 35.0–45.0)
Hemoglobin: 12.5 g/dL (ref 11.7–15.5)
Lymphs Abs: 1762 cells/uL (ref 850–3900)
MCH: 29.6 pg (ref 27.0–33.0)
MCHC: 34 g/dL (ref 32.0–36.0)
MCV: 87.2 fL (ref 80.0–100.0)
MPV: 10 fL (ref 7.5–12.5)
Monocytes Relative: 6.8 %
Neutro Abs: 4310 cells/uL (ref 1500–7800)
Neutrophils Relative %: 65.3 %
Platelets: 281 10*3/uL (ref 140–400)
RBC: 4.22 10*6/uL (ref 3.80–5.10)
RDW: 12.5 % (ref 11.0–15.0)
Total Lymphocyte: 26.7 %
WBC mixed population: 449 cells/uL (ref 200–950)
WBC: 6.6 10*3/uL (ref 3.8–10.8)

## 2017-07-22 LAB — LIPASE: Lipase: 24 U/L (ref 7–60)

## 2017-07-22 LAB — AMYLASE: Amylase: 42 U/L (ref 21–101)

## 2017-07-22 MED ORDER — CHOLESTYRAMINE 4 GM/DOSE PO POWD: 4.0000 g | Freq: Two times a day (BID) | ORAL | 12 refills | Status: DC | PRN

## 2017-07-22 MED FILL — CHOLESTYRAMINE POWDER: 4 | 21 days supply | Qty: 378 | Fill #0

## 2017-07-22 NOTE — Patient Instructions (Addendum)
Please complete lab work prior to leaving. You may add cholestyramine 2x daily as needed for diarrhea.  Complete lab work prior to leaving.

## 2017-07-22 NOTE — Progress Notes (Signed)
Subjective:    Patient ID: Pham Pham, female    DOB: 09/28/1980, 37 y.o.   MRN: [id number]  HPI  Pham Pham is a 37 yr old female who presents today with chief complaint of diarrhea.   Reports that symptoms started about 3 weeks ago. Started with nausea/vomitting, nausea and low grade fever.  Reports that no mater what she eats she has watery diarrhea.  Has a dull persistent epigastric pain.  Her last round of abx was in March x 5 days (amoxicillin) for dental procedure.  No improvement in diarrhea with imodium.  Wt Readings from Last 3 Encounters:  07/22/17 158 lb 9.6 oz (71.9 kg)  07/08/17 153 lb (69.4 kg)  07/05/17 161 lb (73 kg)     Husband had similar symptoms as well.   Review of Systems See HPI  Past Medical History:  Diagnosis Date  . Anxiety   . Chiari malformation   . Endometriosis   . GERD (gastroesophageal reflux disease)    tums otc, prevacid prn  . Headache   . Low blood pressure    pt states history of low blood pressure-fainted 12/2010-instructed to move slowly  . SLE (systemic lupus erythematosus) ([facility]) 03/26/2016     Social History   Socioeconomic History  . Marital status: Married    Spouse name: Not on file  . Number of children: 1  . Years of education: Assoc  . Highest education level: Not on file  Occupational History  . Occupation: nurse  Social Needs  . Financial resource strain: Not on file  . Food insecurity:    Worry: Not on file    Inability: Not on file  . Transportation needs:    Medical: Not on file    Non-medical: Not on file  Tobacco Use  . Smoking status: Former Smoker    Packs/day: 1.00    Years: 10.00    Pack years: 10.00    Types: Cigarettes    Last attempt to quit: 04/04/2004    Years since quitting: 13.3  . Smokeless tobacco: Never Used  Substance and Sexual Activity  . Alcohol use: No  . Drug use: No  . Sexual activity: Not on file  Lifestyle  . Physical activity:    Days per week: Not on file   Minutes per session: Not on file  . Stress: Not on file  Relationships  . Social connections:    Talks on phone: Not on file    Gets together: Not on file    Attends religious service: Not on file    Active member of club or organization: Not on file    Attends meetings of clubs or organizations: Not on file    Relationship status: Not on file  . Intimate partner violence:    Fear of current or ex partner: Not on file    Emotionally abused: Not on file    Physically abused: Not on file    Forced sexual activity: Not on file  Other Topics Concern  . Not on file  Social History Narrative   Works as an Therapist, sports at [employer]   Married   1 son- born 2004 has autism   Enjoys exercise, special olympics with son   Drinks 4-5 cups of coffee a day     Past Surgical History:  Procedure Laterality Date  . ABDOMINAL HYSTERECTOMY  2007  . CYSTOSCOPY  2007  . DIAGNOSTIC LAPAROSCOPY  2005  . ROTATOR CUFF REPAIR  2010  right shoulder  . SALPINGOOPHORECTOMY  04/16/2011   Procedure: SALPINGO OOPHERECTOMY;  Surgeon: Pham Kim, MD;  Location: [facility];  Service: Gynecology;  Laterality: Right;  . skull decompression surgery 2005      Family History  Problem Relation Age of Onset  . Hypertension Mother   . CAD Mother   . Breast cancer Mother   . Migraines Mother   . Gout Mother   . Hypertension Father   . CAD Father   . CVA Father   . Sjogren's syndrome Sister   . Rheum arthritis Maternal Grandmother   . Autism Son   . ADD / ADHD Son     Allergies  Allergen Reactions  . Effexor [Venlafaxine] Nausea Only    insomnia  . Lexapro [Escitalopram Oxalate]     Insomnia, stomach upset    Current Outpatient Medications on File Prior to Visit  Medication Sig Dispense Refill  . hydroxychloroquine (PLAQUENIL) 200 MG tablet TAKE ONE TABLET TWO TIMES A DAY MONDAY THROUGH FRIDAY. 40 tablet 0  . ibuprofen (ADVIL,MOTRIN) 800 MG tablet Take by mouth.    Marland Kitchen ketorolac (TORADOL) 10 MG  tablet Take 1 tablet (10 mg total) by mouth every 6 (six) hours as needed. Take 1 tablet at onset of headache, may take 3 times daily if needed. Do not take for more than 3 days in a row 30 tablet 3  . Multiple Vitamins-Minerals (MULTIVITAMIN WITH MINERALS) tablet Take by mouth daily.    . ondansetron (ZOFRAN) 4 MG tablet Take 1 tablet (4 mg total) by mouth every 8 (eight) hours as needed for nausea or vomiting. 20 tablet 0  . pantoprazole (PROTONIX) 40 MG tablet Take 1 tablet (40 mg total) by mouth daily. 30 tablet 3  . SUMAtriptan (IMITREX) 100 MG tablet Take 1 tablet (100 mg total) by mouth at onset of migraine. May repeat in 2 hours if headache persists or recurs. Do not take more than 2 doses in 24 hours. 10 tablet 2  . Topiramate ER (QUDEXY XR) 200 MG CS24 sprinkle capsule Take 1 capsule by mouth at bedtime. 90 each 4  . Vitamin D, Ergocalciferol, (DRISDOL) 50000 units CAPS capsule Take 1 capsule (50,000 Units total) by mouth every 7 (seven) days. 12 capsule 0   Current Facility-Administered Medications on File Prior to Visit  Medication Dose Route Frequency Provider Last Rate Last Dose  . gadopentetate dimeglumine (MAGNEVIST) injection 15 mL  15 mL Intravenous Once PRN Melvenia Beam, MD        BP 114/67 (BP Location: Right Arm, Patient Position: Sitting, Cuff Size: Normal)   Pulse 73   Temp 97.9 F (36.6 C) (Oral)   Resp 16   Ht 5\' 9"  (1.753 m)   Wt 158 lb 9.6 oz (71.9 kg)   SpO2 100%   BMI 23.42 kg/m        Objective:   Physical Exam  Constitutional: She is oriented to person, place, and time. She appears well-developed and well-nourished.  Cardiovascular: Normal rate, regular rhythm and normal heart sounds.  No murmur heard. Pulmonary/Chest: Effort normal and breath sounds normal. No respiratory distress. She has no wheezes.  Abdominal: Soft. Bowel sounds are normal. She exhibits no distension. There is tenderness in the epigastric area. There is no guarding.    Musculoskeletal: She exhibits no edema.  Neurological: She is alert and oriented to person, place, and time.  Psychiatric: She has a normal mood and affect. Her behavior is normal. Judgment and thought  content normal.          Assessment & Plan:  Epigastric tenderness- differential includes Gastritis/ulcer, pancreatitis, cholecystitis. Check amylase/lipase/CBC.    Diarrhea- check stool studies, trial of cholestyramine as needed for symptom relief.

## 2017-07-23 ENCOUNTER — Encounter: Payer: Self-pay | Admitting: Family

## 2017-07-25 ENCOUNTER — Other Ambulatory Visit: Payer: Commercial Managed Care - PPO

## 2017-07-25 ENCOUNTER — Other Ambulatory Visit: Payer: Self-pay

## 2017-07-25 NOTE — Progress Notes (Signed)
l °

## 2017-07-26 ENCOUNTER — Encounter: Payer: Self-pay | Admitting: Family

## 2017-07-26 ENCOUNTER — Other Ambulatory Visit: Payer: Self-pay | Admitting: Family

## 2017-07-26 ENCOUNTER — Encounter: Payer: Self-pay | Admitting: Gastroenterology

## 2017-07-26 DIAGNOSIS — R197 Diarrhea, unspecified: Secondary | ICD-10-CM

## 2017-07-26 LAB — GI PROFILE, STOOL, PCR

## 2017-07-27 ENCOUNTER — Encounter: Payer: Self-pay | Admitting: Neurology

## 2017-07-27 ENCOUNTER — Ambulatory Visit: Payer: Commercial Managed Care - PPO | Admitting: Neurology

## 2017-07-27 VITALS — BP 102/63 | HR 87 | Ht 69.0 in | Wt 159.0 lb

## 2017-07-27 DIAGNOSIS — G43711 Chronic migraine without aura, intractable, with status migrainosus: Secondary | ICD-10-CM | POA: Insufficient documentation

## 2017-07-27 DIAGNOSIS — F445 Conversion disorder with seizures or convulsions: Secondary | ICD-10-CM | POA: Diagnosis not present

## 2017-07-27 HISTORY — DX: Chronic migraine without aura, intractable, with status migrainosus: G43.711

## 2017-07-27 HISTORY — DX: Conversion disorder with seizures or convulsions: F44.5

## 2017-07-27 MED ORDER — FREMANEZUMAB-VFRM 225 MG/1.5ML ~~LOC~~ SOSY: 225.0000 mg | PREFILLED_SYRINGE | SUBCUTANEOUS | 0 refills | Status: DC

## 2017-07-27 NOTE — Progress Notes (Signed)
[facility]    Provider:  Dr Jaynee Eagles Referring Provider: Debbrah Alar, NP Primary Care Physician:  Debbrah Alar, NP  CC:  Headache, passing out, disorientation and twitches  Interval history 07/27/2017: Migraines last 2-6 days, more on the right side, sensory changes in the face and right arm, vision acting "weird", advised her to see her eye doctor for a follow up. She is searching for words. No syncopal episodes. 25 headache days a month. At least 20 migraine days a month. No medication overuse. No aura. Right side, pounding throbbing, light and sound sensitivity, nausea, moevement makes it worse, nausea. Been ongoing at this frequency for 6 months. She has failed multiple classes of medications.  Meds tried: Amitriptyline, Qudexy, prozac, zofran, imitrex  Interval history 12/22/2016: Patient is here for altered awareness started on topiramate which would help with concomitant migraines. MRI shows Prior suboccipital decompression surgery for Chiari malformation and the brain is otherwise normal and there are no acute findings. The patient was first seen she complained of of altered awareness, with a previous eeg showing focal slowing over the left temporal region which may indicate seizure focus but is not technically epileptiform and non-seizure activity on EEG. A prolonged sleep deprived EEG follow-up here in our office was normal. Patient then reported more episodes of altered awareness and she was referred to the prolonged inpatient  electronic monitoring unit with Dr. Ernest Haber to evaluate whether these were nonorganic i.e. Pseudo-seizures or nonepileptic events did not show eeg correlate even during her typical altered awareness events. She was continued on topiramate and gabapentin. Repeat MRI in 2018 showed no change since 2005 of MRI. Patient was admitted to the electronic monitoring unit, she had 2 clinical events of unresponsiveness, no abnormal  movements or behaviors except left-sided shaking, there was no EEG corresponding abnormality, she was DC'd on higher dose of topiramate for migraine prevention.  She is improved on the Qudexy 200mg .   Interval history 08/31/2016: Extended 90 minute EEG sleep deprived was normal. She did not have the 3-day eeg completed or the MRI of the brain completed that I ordered at last appt. She has episodes of altered awareness more frequently now, happening more frequently, varies but often 2-3x a week. She has chronic hypotension, but when the episodes occurs her BP and pulse increase. 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 She is crying in the office today. She should not be driving, discussed. Then after the episodes she gets a horrible headache. Headaches are every other day. She has had this one since Saturday. Topiramate helps with the headaches. Had a Hysterectomy in the past. Decreased appetite on the Topamax. She is having new numbness on the left side of the face, shooting pain, she is having left arm numbness.   HPI:  Kim Pham is a 37 y.o. female here as a referral from Dr. Inda Castle for headache and syncopal episodes.PMHx migraines and chiari malformation s/p decompression surgery.  In 2005 she was "randomly passing out" and having headaches and she was finding herself on the floor. A mild chiari malformation of 31mm was  found and she had decompression surgery which helped. This past June her headaches started changing and worsening. Migraines usually on the right side but lately she has intense debilitating pain on the top of her head. 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 She does not know where she is when these episodes resolve. She does urinate.defecate, she works at a nursing home. This past week she has had 3 of them but usually 1-2x a week at work and at home. She gets disoriented when driving and she is losing time driving. She is losing words. No triggers at work, no significant stress. Sometimes the episodes start with a headache sometimes they do not. She feels anxious before it comes on, she feels jittery. She is having the headaches almost every day. She cannot have children and she had  A partial hysterectomy. Father with migraines. PGF with seizures. No other focal neurologic deficits or complaints, associates symptoms or modifying factors.    Headaches: Imitrex  Reviewed notes, labs and imaging from outside physicians, which showed:   Impression: This awake and asleepEEG is abnormal due to occasional focal slowing over the left temporal region.  Clinical Correlation of the above findings indicates focal cerebral dysfunction over the left temporal region, suggestive of underlying structural or physiologic abnormality. The absence of epileptiform discharges does not exclude a clinical diagnosis of epilepsy. Clinical correlation is advised.  CT head 12/2015 (personally reviewed images): Brain: No evidence of infarction, hemorrhage, hydrocephalus, extra-axial collection or mass lesion/mass effect.  Vascular: No hyperdense vessel or unexpected calcification.  Skull: Suboccipital craniotomy noted without other significant abnormality.  Sinuses/Orbits: Visualized portions unremarkable.  Other: None  IMPRESSION: No evidence of intracranial  abnormality.  Clinical data: Syncope; headache; fell and hit head  MRI BRAIN WITHOUT CONTRAST 2005 The ventricles are normal in size. There is no infarct or mass. White matter has normal signal. There is no evidence of intracranial blood or fluid collection.  The cerebellar tonsils are approximately 6 mm below the foramen magnum compatible with a mild Chiari malformation.  IMPRESSION  Mild Chiari malformation. No acute intracranial abnormality.  CBC, BMP nomral 01/11/2015  Review of Systems: Patient complains of symptoms per HPI as well as the following symptoms fatigue, confusion, weakness, anxiety, allergies. Pertinent negatives per HPI. All others negative.  Social History   Socioeconomic History  . Marital status: Married    Spouse name: Not on file  . Number of children: 1  . Years of education: Assoc  . Highest education level: Not on file  Occupational History  . Occupation: nurse  Social Needs  . Financial resource strain: Not on file  . Food insecurity:    Worry: Not on file    Inability: Not on file  . Transportation needs:    Medical: Not on file    Non-medical: Not on file  Tobacco Use  . Smoking status: Former Smoker    Packs/day: 1.00    Years: 10.00    Pack years: 10.00    Types: Cigarettes    Last attempt to quit: 04/04/2004    Years since quitting: 13.3  . Smokeless tobacco: Never Used  Substance and Sexual Activity  .  Alcohol use: No  . Drug use: No  . Sexual activity: Not on file  Lifestyle  . Physical activity:    Days per week: Not on file    Minutes per session: Not on file  . Stress: Not on file  Relationships  . Social connections:    Talks on phone: Not on file    Gets together: Not on file    Attends religious service: Not on file    Active member of club or organization: Not on file    Attends meetings of clubs or organizations: Not on file    Relationship status: Not on file  . Intimate partner violence:    Fear of current or  ex partner: Not on file    Emotionally abused: Not on file    Physically abused: Not on file    Forced sexual activity: Not on file  Other Topics Concern  . Not on file  Social History Narrative   Works as an LPN @ [employer]   Married   1 son- born 2004 has autism   Enjoys exercise, special olympics with son   Drinks 4-5 cups of coffee a day     Family History  Problem Relation Age of Onset  . Hypertension Mother   . CAD Mother   . Breast cancer Mother   . Migraines Mother   . Gout Mother   . Hypertension Father   . CAD Father   . CVA Father   . Sjogren's syndrome Sister   . Rheum arthritis Maternal Grandmother   . Autism Son   . ADD / ADHD Son     Past Medical History:  Diagnosis Date  . Anxiety   . Chiari malformation   . Endometriosis   . GERD (gastroesophageal reflux disease)    tums otc, prevacid prn  . Headache   . Low blood pressure    pt states history of low blood pressure-fainted 12/2010-instructed to move slowly  . SLE (systemic lupus erythematosus) ([place]) 03/26/2016    Past Surgical History:  Procedure Laterality Date  . ABDOMINAL HYSTERECTOMY  2007  . CYSTOSCOPY  2007  . DIAGNOSTIC LAPAROSCOPY  2005  . ROTATOR CUFF REPAIR  2010   right shoulder  . SALPINGOOPHORECTOMY  04/16/2011   Procedure: SALPINGO OOPHERECTOMY;  Surgeon: Lovenia Kim, MD;  Location: [facility];  Service: Gynecology;  Laterality: Right;  . skull decompression surgery 2005      Current Outpatient Medications  Medication Sig Dispense Refill  . cholestyramine (QUESTRAN) 4 GM/DOSE powder Take 1 packet (4 g total) by mouth 2 (two) times daily as needed. For diarrhea 378 g 12  . hydroxychloroquine (PLAQUENIL) 200 MG tablet TAKE ONE TABLET TWO TIMES A DAY MONDAY THROUGH FRIDAY. 40 tablet 0  . ibuprofen (ADVIL,MOTRIN) 800 MG tablet Take by mouth.    Marland Kitchen ketorolac (TORADOL) 10 MG tablet Take 1 tablet (10 mg total) by mouth every 6 (six) hours as needed. Take 1 tablet at onset of  headache, may take 3 times daily if needed. Do not take for more than 3 days in a row 30 tablet 3  . Multiple Vitamins-Minerals (MULTIVITAMIN WITH MINERALS) tablet Take by mouth daily.    . ondansetron (ZOFRAN) 4 MG tablet Take 1 tablet (4 mg total) by mouth every 8 (eight) hours as needed for nausea or vomiting. 20 tablet 0  . pantoprazole (PROTONIX) 40 MG tablet Take 1 tablet (40 mg total) by mouth daily. 30 tablet 3  .  SUMAtriptan (IMITREX) 100 MG tablet Take 1 tablet (100 mg total) by mouth at onset of migraine. May repeat in 2 hours if headache persists or recurs. Do not take more than 2 doses in 24 hours. 10 tablet 2  . Topiramate ER (QUDEXY XR) 200 MG CS24 sprinkle capsule Take 1 capsule by mouth at bedtime. 90 each 4  . Vitamin D, Ergocalciferol, (DRISDOL) 50000 units CAPS capsule Take 1 capsule (50,000 Units total) by mouth every 7 (seven) days. 12 capsule 0  . Fremanezumab-vfrm (AJOVY) 225 MG/1.5ML SOSY Inject 225 mg into the skin every 30 (thirty) days. 3 Syringe 0   No current facility-administered medications for this visit.    Facility-Administered Medications Ordered in Other Visits  Medication Dose Route Frequency Provider Last Rate Last Dose  . gadopentetate dimeglumine (MAGNEVIST) injection 15 mL  15 mL Intravenous Once PRN Melvenia Beam, MD        Allergies as of 07/27/2017 - Review Complete 07/27/2017  Allergen Reaction Noted  . Effexor [venlafaxine] Nausea Only 12/22/2016  . Lexapro [escitalopram oxalate]  08/16/2016    Vitals: BP 102/63 (BP Location: Right Arm, Patient Position: Sitting)   Pulse 87   Ht 5\' 9"  (1.753 m)   Wt 159 lb (72.1 kg)   BMI 23.48 kg/m  Last Weight:  Wt Readings from Last 1 Encounters:  07/27/17 159 lb (72.1 kg)   Last Height:   Ht Readings from Last 1 Encounters:  07/27/17 5\' 9"  (1.753 m)   Physical exam: Exam: Gen: NAD, conversant, well nourised,  well groomed                     CV: RRR, no MRG. No Carotid Bruits. No peripheral  edema, warm, nontender Eyes: Conjunctivae clear without exudates or hemorrhage  Neuro: Detailed Neurologic Exam  Speech:    Speech is normal; fluent and spontaneous with normal comprehension.  Cognition:    The patient is oriented to person, place, and time;     recent and remote memory intact;     language fluent;     normal attention, concentration,     fund of knowledge Cranial Nerves:    The pupils are equal, round, and reactive to light. The fundi are normal and spontaneous venous pulsations are present. Visual fields are full to finger confrontation. Extraocular movements are intact. Trigeminal sensation is intact and the muscles of mastication are normal. The face is symmetric. The palate elevates in the midline. Hearing intact. Voice is normal. Shoulder shrug is normal. The tongue has normal motion without fasciculations.   Coordination:    Normal finger to nose and heel to shin. Normal rapid alternating movements.   Gait:    Heel-toe and tandem gait are normal.   Motor Observation:    No asymmetry, no atrophy, and no involuntary movements noted. Tone:    Normal muscle tone.    Posture:    Posture is normal. normal erect    Strength:    Strength is V/V in the upper and lower limbs.      Sensation: intact to LT     Reflex Exam:  DTR's:    Deep tendon reflexes in the upper and lower extremities are normal bilaterally.   Toes:    The toes are downgoing bilaterally.   Clonus:    Clonus is absent.  Assessment/Plan:37 year old with Chronic Migraines.   - Failed multiple classes of medications. Currently has 25 headache days a month, 20 of them  are migrainous. Start Botox.   Prolonged inpatient  electronic monitoring unit with Dr. Ernest Haber to evaluate whether syncopal were nonorganic i.e. pseudoseizures or nonepileptic events did not show eeg correlate even during her typical altered awareness events likely stress related  Toradol for acute management of  migraines and cont Qudexy, stable or improved.  90-minute sleep deprived EEG was normal. As far as your medications are concerned, I would like to suggest: Continue Topiramate Qudexy 200mg  daily (discussed teratogenicity and using backup birth control, do not get pregnant). Will start Gabapentin 300mg  three times a day for migraine but this is also an AED. Start Botox and Ajovy.  Patient is unable to drive, operate heavy machinery, perform activities at heights or participate in water activities until 6 months free of altered awareness. Discussed again today, she should not drive.   Call 911 for any other episodes, discussed risk of generalization to convulsive seizures  Sarina Ill, MD Sarina Ill, MD  [facility] [address] [place], [place] [address]  Phone [phone] Fax [phone]  A total of 40 minutes was spent face-to-face with this patient. Over half this time was spent on counseling patient on the migraines, non-epileptic stress-related altered awareness diagnosis and different diagnostic and therapeutic options available.

## 2017-07-27 NOTE — Patient Instructions (Signed)
Fremanezumab: Patient drug information Access [employer] here. Copyright 1978-2019 [employer] All rights reserved. (For additional information see "Fremanezumab: Drug information") Brand Names: [place]  Ajovy  What is this drug used for?   It is used to prevent migraine headaches.  What do I need to tell my doctor BEFORE I take this drug?   If you have an allergy to this drug or any part of this drug.   If you are allergic to any drugs like this one, any other drugs, foods, or other substances. Tell your doctor about the allergy and what signs you had, like rash; hives; itching; shortness of breath; wheezing; cough; swelling of face, lips, tongue, or throat; or any other signs.   This drug may interact with other drugs or health problems.   Tell your doctor and pharmacist about all of your drugs (prescription or OTC, natural products, vitamins) and health problems. You must check to make sure that it is safe for you to take this drug with all of your drugs and health problems. Do not start, stop, or change the dose of any drug without checking with your doctor.  What are some things I need to know or do while I take this drug?   Tell all of your health care providers that you take this drug. This includes your doctors, nurses, pharmacists, and dentists.   Allergic reactions have happened within hours and up to 1 month after this drug was given. Tell your doctor right away about any bad effects after getting this drug.   Tell your doctor if you are pregnant or plan on getting pregnant. You will need to talk about the benefits and risks of using this drug while you are pregnant.   Tell your doctor if you are breast-feeding. You will need to talk about any risks to your baby.  What are some side effects that I need to call my doctor about right away?   WARNING/CAUTION: Even though it may be rare, some people may have very bad and sometimes deadly side effects when taking a drug. Tell your  doctor or get medical help right away if you have any of the following signs or symptoms that may be related to a very bad side effect:   Signs of an allergic reaction, like rash; hives; itching; red, swollen, blistered, or peeling skin with or without fever; wheezing; tightness in the chest or throat; trouble breathing, swallowing, or talking; unusual hoarseness; or swelling of the mouth, face, lips, tongue, or throat.   Very bad irritation where the shot was given.  What are some other side effects of this drug?   All drugs may cause side effects. However, many people have no side effects or only have minor side effects. Call your doctor or get medical help if any of these side effects or any other side effects bother you or do not go away:   Irritation where the shot is given.   These are not all of the side effects that may occur. If you have questions about side effects, call your doctor. Call your doctor for medical advice about side effects.   You may report side effects to your national health agency.  How is this drug best taken?   Use this drug as ordered by your doctor. Read all information given to you. Follow all instructions closely.   It is given as a shot into the fatty part of the skin on the top of the thigh, belly area,   or upper arm.   If you will be giving yourself the shot, your doctor or nurse will teach you how to give the shot.   Follow how to use as you have been told by the doctor or read the package insert.   Wash your hands before use.   If stored in a refrigerator, let this drug come to room temperature before using it. Leave it at room temperature for at least 30 minutes. Do not heat this drug.   Do not shake.   Do not give into skin that is irritated, bruised, red, infected, or scarred.   Do not give into the same place as another shot.   If your dose is more than 1 injection, you may give it in the same body part. Make sure you do not give injections in the same  spot you used for the other injections.   Do not use if the solution is cloudy, leaking, or has particles.   This drug is colorless to a faint yellow. Do not use if the solution changes color.   Each prefilled syringe is for one use only.   Throw away needles in a needle/sharp disposal box. Do not reuse needles or other items. When the box is full, follow all local rules for getting rid of it. Talk with a doctor or pharmacist if you have any questions.  What do I do if I miss a dose?   Take a missed dose as soon as you think about it.   After taking a missed dose, start a new schedule based on when the dose is taken.  How do I store and/or throw out this drug?   Store in a refrigerator. Do not freeze.   Store in the carton to protect from light.   If needed, this drug can be left out at room temperature for up to 24 hours. Throw away drug if left at room temperature for more than 24 hours.   Protect from heat and sunlight.   Keep all drugs in a safe place. Keep all drugs out of the reach of children and pets.   Throw away unused or expired drugs. Do not flush down a toilet or pour down a drain unless you are told to do so. Check with your pharmacist if you have questions about the best way to throw out drugs. There may be drug take-back programs in your area.  General drug facts   If your symptoms or health problems do not get better or if they become worse, call your doctor.   Do not share your drugs with others and do not take anyone else's drugs.   Keep a list of all your drugs (prescription, natural products, vitamins, OTC) with you. Give this list to your doctor.   Talk with the doctor before starting any new drug, including prescription or OTC, natural products, or vitamins.   Some drugs may have another patient information leaflet. If you have any questions about this drug, please talk with your doctor, nurse, pharmacist, or other health care provider.   If you think there has been an  overdose, call your poison control center or get medical care right away. Be ready to tell or show what was taken, how much, and when it happened.   

## 2017-07-28 ENCOUNTER — Encounter: Payer: Self-pay | Admitting: Neurology

## 2017-07-29 ENCOUNTER — Encounter: Payer: Self-pay | Admitting: Family

## 2017-07-29 LAB — STOOL CULTURE
MICRO NUMBER:: 90429786
MICRO NUMBER:: 90429787
MICRO NUMBER:: 90429788
SHIGA RESULT:: NOT DETECTED
SPECIMEN QUALITY:: ADEQUATE
SPECIMEN QUALITY:: ADEQUATE
SPECIMEN QUALITY:: ADEQUATE

## 2017-08-08 ENCOUNTER — Encounter: Payer: Self-pay | Admitting: Family

## 2017-08-08 ENCOUNTER — Ambulatory Visit: Payer: Commercial Managed Care - PPO | Admitting: Family

## 2017-08-08 ENCOUNTER — Telehealth: Payer: Self-pay | Admitting: Neurology

## 2017-08-08 ENCOUNTER — Encounter: Payer: Self-pay | Admitting: Neurology

## 2017-08-08 VITALS — BP 101/63 | HR 62 | Temp 98.2°F | Resp 16 | Ht 68.5 in | Wt 160.4 lb

## 2017-08-08 DIAGNOSIS — R197 Diarrhea, unspecified: Secondary | ICD-10-CM | POA: Diagnosis not present

## 2017-08-08 NOTE — Telephone Encounter (Signed)
FYI-I rescheduled patient's Botox appointment to 09-01-17 with Dr,. Jaynee Eagles.

## 2017-08-08 NOTE — Telephone Encounter (Signed)
Pt is asking if she can be rescheduled for 05-01.  Please call

## 2017-08-08 NOTE — Patient Instructions (Signed)
Please keep your upcoming appointment with GI. You may use imodium as needed. Call if increase abdominal pain, blood in stool or if you develop fever >101.

## 2017-08-08 NOTE — Telephone Encounter (Signed)
I called the patient back to let her know there was not an apt available on the 1st of May. She did not answer so I left a VM asking her to call back.

## 2017-08-08 NOTE — Telephone Encounter (Signed)
Noted, thank you Shirley!  °

## 2017-08-11 NOTE — Progress Notes (Signed)
Subjective:    Patient ID: Pham Pham, female    DOB: [birth year], 37 y.o.   MRN: [id number]  HPI   Subjective:  Patient ID: Pham Pham, female DOB: 1981/03/27, 37 y.o. MRN: [id number]  HPI  Pham Pham is a 37 yr old female with hx of SLE who presents today for follow up of her diarrhea. Cholestyramine helps some. Good day has loose stool 5 x a day and bad day 10-12 times. She is watching her diet, limiting caffeine, probiotic without significant changes in bowels.  Has appointment next week with GI. Reports low grade temps intermittently. Max 100. Has more mucous in the stool when she has fever.  Review of Systems  See HPI      Past Medical History:  Diagnosis Date  . Anxiety   . Chiari malformation   . Endometriosis   . GERD (gastroesophageal reflux disease)    tums otc, prevacid prn  . Headache   . Low blood pressure    pt states history of low blood pressure-fainted 12/2010-instructed to move slowly  . SLE (systemic lupus erythematosus) ([place]) 03/26/2016   Social History        Socioeconomic History  . Marital status: Married    Spouse name: Not on file  . Number of children: 1  . Years of education: Assoc  . Highest education level: Not on file  Occupational History  . Occupation: nurse  Social Needs  . Financial resource strain: Not on file  . Food insecurity:    Worry: Not on file    Inability: Not on file  . Transportation needs:    Medical: Not on file    Non-medical: Not on file  Tobacco Use  . Smoking status: Former Smoker    Packs/day: 1.00    Years: 10.00    Pack years: 10.00    Types: Cigarettes    Last attempt to quit: 04/04/2004    Years since quitting: 13.3  . Smokeless tobacco: Never Used  Substance and Sexual Activity  . Alcohol use: No  . Drug use: No  . Sexual activity: Not on file  Lifestyle  . Physical activity:    Days per week: Not on file    Minutes per session: Not on file  . Stress: Not on file  Relationships  .  Social connections:    Talks on phone: Not on file    Gets together: Not on file    Attends religious service: Not on file    Active member of club or organization: Not on file    Attends meetings of clubs or organizations: Not on file    Relationship status: Not on file  . Intimate partner violence:    Fear of current or ex partner: Not on file    Emotionally abused: Not on file    Physically abused: Not on file    Forced sexual activity: Not on file  Other Topics Concern  . Not on file  Social History Narrative   Works as an LPN @ [employer]   Married   1 son- born 2004 has autism   Enjoys exercise, special olympics with son   Drinks 4-5 cups of coffee a day         Past Surgical History:  Procedure Laterality Date  . ABDOMINAL HYSTERECTOMY  2007  . CYSTOSCOPY  2007  . DIAGNOSTIC LAPAROSCOPY  2005  . ROTATOR CUFF REPAIR  2010   right shoulder  . SALPINGOOPHORECTOMY  04/16/2011   Procedure: SALPINGO OOPHERECTOMY; Surgeon: Pham Kim, MD; Location: [facility]; Service: Gynecology; Laterality: Right;  . skull decompression surgery 2005          Family History  Problem Relation Age of Onset  . Hypertension Mother   . CAD Mother   . Breast cancer Mother   . Migraines Mother   . Gout Mother   . Hypertension Father   . CAD Father   . CVA Father   . Sjogren's syndrome Sister   . Rheum arthritis Maternal Grandmother   . Autism Son   . ADD / ADHD Son         Allergies  Allergen Reactions  . Effexor [Venlafaxine] Nausea Only    insomnia  . Lexapro [Escitalopram Oxalate]     Insomnia, stomach upset         Current Outpatient Medications on File Prior to Visit  Medication Sig Dispense Refill  . cholestyramine (QUESTRAN) 4 GM/DOSE powder Take 1 packet (4 g total) by mouth 2 (two) times daily as needed. For diarrhea 378 g 12  . Fremanezumab-vfrm (AJOVY) 225 MG/1.5ML SOSY Inject 225 mg into the skin every 30 (thirty) days. 3 Syringe 0  . hydroxychloroquine  (PLAQUENIL) 200 MG tablet TAKE ONE TABLET TWO TIMES A DAY MONDAY THROUGH FRIDAY. 40 tablet 0  . ibuprofen (ADVIL,MOTRIN) 800 MG tablet Take by mouth.    Marland Kitchen ketorolac (TORADOL) 10 MG tablet Take 1 tablet (10 mg total) by mouth every 6 (six) hours as needed. Take 1 tablet at onset of headache, may take 3 times daily if needed. Do not take for more than 3 days in a row 30 tablet 3  . Multiple Vitamins-Minerals (MULTIVITAMIN WITH MINERALS) tablet Take by mouth daily.    . ondansetron (ZOFRAN) 4 MG tablet Take 1 tablet (4 mg total) by mouth every 8 (eight) hours as needed for nausea or vomiting. 20 tablet 0  . pantoprazole (PROTONIX) 40 MG tablet Take 1 tablet (40 mg total) by mouth daily. 30 tablet 3  . SUMAtriptan (IMITREX) 100 MG tablet Take 1 tablet (100 mg total) by mouth at onset of migraine. May repeat in 2 hours if headache persists or recurs. Do not take more than 2 doses in 24 hours. 10 tablet 2  . Topiramate ER (QUDEXY XR) 200 MG CS24 sprinkle capsule Take 1 capsule by mouth at bedtime. 90 each 4  . Vitamin D, Ergocalciferol, (DRISDOL) 50000 units CAPS capsule Take 1 capsule (50,000 Units total) by mouth every 7 (seven) days. 12 capsule 0            Current Facility-Administered Medications on File Prior to Visit  Medication Dose Route Frequency Provider Last Rate Last Dose  . gadopentetate dimeglumine (MAGNEVIST) injection 15 mL 15 mL Intravenous Once PRN Melvenia Beam, MD     BP 101/63 (BP Location: Right Arm, Patient Position: Sitting, Cuff Size: Small)  Pulse 62  Temp 98.2 F (36.8 C) (Oral)  Resp 16  Ht 5' 8.5" (1.74 m)  Wt 160 lb 6.4 oz (72.8 kg)  SpO2 100%  BMI 24.03 kg/m      Review of Systems See HPI  Past Medical History:  Diagnosis Date  . Anxiety   . Chiari malformation   . Endometriosis   . GERD (gastroesophageal reflux disease)    tums otc, prevacid prn  . Headache   . Low blood pressure    pt states history of low blood pressure-fainted  12/2010-instructed to move  slowly  . SLE (systemic lupus erythematosus) ([place]) 03/26/2016     Social History   Socioeconomic History  . Marital status: Married    Spouse name: Not on file  . Number of children: 1  . Years of education: Assoc  . Highest education level: Not on file  Occupational History  . Occupation: nurse  Social Needs  . Financial resource strain: Not on file  . Food insecurity:    Worry: Not on file    Inability: Not on file  . Transportation needs:    Medical: Not on file    Non-medical: Not on file  Tobacco Use  . Smoking status: Former Smoker    Packs/day: 1.00    Years: 10.00    Pack years: 10.00    Types: Cigarettes    Last attempt to quit: 04/04/2004    Years since quitting: 13.3  . Smokeless tobacco: Never Used  Substance and Sexual Activity  . Alcohol use: No  . Drug use: No  . Sexual activity: Not on file  Lifestyle  . Physical activity:    Days per week: Not on file    Minutes per session: Not on file  . Stress: Not on file  Relationships  . Social connections:    Talks on phone: Not on file    Gets together: Not on file    Attends religious service: Not on file    Active member of club or organization: Not on file    Attends meetings of clubs or organizations: Not on file    Relationship status: Not on file  . Intimate partner violence:    Fear of current or ex partner: Not on file    Emotionally abused: Not on file    Physically abused: Not on file    Forced sexual activity: Not on file  Other Topics Concern  . 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 ABDOMINAL HYSTERECTOMY  2007  . CYSTOSCOPY  2007  . DIAGNOSTIC LAPAROSCOPY  2005  . ROTATOR CUFF REPAIR  2010   right shoulder  . SALPINGOOPHORECTOMY  04/16/2011    Procedure: SALPINGO OOPHERECTOMY;  Surgeon: Pham Kim, MD;  Location: [facility];  Service: Gynecology;  Laterality: Right;  . skull decompression surgery 2005      Family History  Problem Relation Age of Onset  . Hypertension Mother   . CAD Mother   . Breast cancer Mother   . Migraines Mother   . Gout Mother   . Hypertension Father   . CAD Father   . CVA Father   . Sjogren's syndrome Sister   . Rheum arthritis Maternal Grandmother   . Autism Son   . ADD / ADHD Son     Allergies  Allergen Reactions  . Effexor [Venlafaxine] Nausea Only    insomnia  . Lexapro [Escitalopram Oxalate]     Insomnia, stomach upset    Current Outpatient Medications on File Prior to Visit  Medication Sig Dispense Refill  . cholestyramine (QUESTRAN) 4 GM/DOSE powder Take 1 packet (4 g total) by mouth 2 (two) times daily as needed. For diarrhea 378 g 12  . Fremanezumab-vfrm (AJOVY) 225 MG/1.5ML SOSY Inject 225 mg into the skin  every 30 (thirty) days. 3 Syringe 0  . hydroxychloroquine (PLAQUENIL) 200 MG tablet TAKE ONE TABLET TWO TIMES A DAY MONDAY THROUGH FRIDAY. 40 tablet 0  . ibuprofen (ADVIL,MOTRIN) 800 MG tablet Take by mouth.    Marland Kitchen ketorolac (TORADOL) 10 MG tablet Take 1 tablet (10 mg total) by mouth every 6 (six) hours as needed. Take 1 tablet at onset of headache, may take 3 times daily if needed. Do not take for more than 3 days in a row 30 tablet 3  . Multiple Vitamins-Minerals (MULTIVITAMIN WITH MINERALS) tablet Take by mouth daily.    . ondansetron (ZOFRAN) 4 MG tablet Take 1 tablet (4 mg total) by mouth every 8 (eight) hours as needed for nausea or vomiting. 20 tablet 0  . pantoprazole (PROTONIX) 40 MG tablet Take 1 tablet (40 mg total) by mouth daily. 30 tablet 3  . SUMAtriptan (IMITREX) 100 MG tablet Take 1 tablet (100 mg total) by mouth at onset of migraine. May repeat in 2 hours if headache persists or recurs. Do not take more than 2 doses in 24 hours. 10 tablet 2  . Topiramate ER  (QUDEXY XR) 200 MG CS24 sprinkle capsule Take 1 capsule by mouth at bedtime. 90 each 4  . Vitamin D, Ergocalciferol, (DRISDOL) 50000 units CAPS capsule Take 1 capsule (50,000 Units total) by mouth every 7 (seven) days. 12 capsule 0   Current Facility-Administered Medications on File Prior to Visit  Medication Dose Route Frequency Provider Last Rate Last Dose  . gadopentetate dimeglumine (MAGNEVIST) injection 15 mL  15 mL Intravenous Once PRN Melvenia Beam, MD        BP 101/63 (BP Location: Right Arm, Patient Position: Sitting, Cuff Size: Small)   Pulse 62   Temp 98.2 F (36.8 C) (Oral)   Resp 16   Ht 5' 8.5" (1.74 m)   Wt 160 lb 6.4 oz (72.8 kg)   SpO2 100%   BMI 24.03 kg/m       Objective:   Physical Exam  Constitutional: She appears well-developed and well-nourished.  Cardiovascular: Normal rate, regular rhythm and normal heart sounds.  No murmur heard. Pulmonary/Chest: Effort normal and breath sounds normal. No respiratory distress. She has no wheezes.  Abdominal: Bowel sounds are normal.  Mild generalized abdominal tenderness without guarding  Psychiatric: She has a normal mood and affect. Her behavior is normal. Judgment and thought content normal.          Assessment & Plan:  Diarrhea-  Still ongoing.  Stool studies negative.  ? Side effect of plaquenil or topamax versus IBS versus autoimmune etiology.  Having some improvement with questran prn. Advised ok to use imodium sparingly as needed.  She is advised to keep her upcoming appointment with GI.   A total of 15  minutes were spent face-to-face with the patient during this encounter and over half of that time was spent on counseling and coordination of care. The patient was counseled on potential causes for chronic diarrhea, treatment and need for GI evaluation.

## 2017-08-16 ENCOUNTER — Other Ambulatory Visit (INDEPENDENT_AMBULATORY_CARE_PROVIDER_SITE_OTHER): Payer: Commercial Managed Care - PPO

## 2017-08-16 ENCOUNTER — Ambulatory Visit (INDEPENDENT_AMBULATORY_CARE_PROVIDER_SITE_OTHER): Payer: Commercial Managed Care - PPO | Admitting: Gastroenterology

## 2017-08-16 ENCOUNTER — Encounter: Payer: Self-pay | Admitting: Gastroenterology

## 2017-08-16 VITALS — BP 108/74 | HR 64 | Ht 68.0 in | Wt 158.2 lb

## 2017-08-16 DIAGNOSIS — R197 Diarrhea, unspecified: Secondary | ICD-10-CM | POA: Diagnosis not present

## 2017-08-16 DIAGNOSIS — K625 Hemorrhage of anus and rectum: Secondary | ICD-10-CM | POA: Diagnosis not present

## 2017-08-16 MED ORDER — MOVIPREP 100 G PO SOLR: 1.0000 | Freq: Once | ORAL | 0 refills | Status: AC

## 2017-08-16 NOTE — Progress Notes (Signed)
Chief Complaint: Bloody diarrhea  Referring Provider:  Debbrah Alar, NP      ASSESSMENT AND PLAN;   #1. Bloody Diarrhea, with H/O SLE, negative stool studies,   #2. RLQ abdominal pain with neg CT 01/11/2015, neg [place] 2019 #3. Family history of crohn's (brother) and celiac disease (sister).  PLAN: - Proceed with colonoscopy for further evaluation.  - Celiac screen.  For colonoscopy.  I have discussed the risks and benefits.  The risks including risk of perforation requiring laparotomy, bleeding after polypectomy requiring blood transfusions and risks of anesthesia/sedation were discussed.  Rare risks of missing colorectal neoplasms were also discussed.  Alternatives were given.  Patient is fully aware and agrees to proceed. All the questions were answered. Colonoscopy will be scheduled in upcoming days.  Patient is to report immediately if there is any significant weight loss or excessive bleeding until then. Consent forms were given for review.    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  She also has nocturnal symptoms. BMs 5/day to 12/day, with mucus and occasional fever. Has been on antibiotics -amox x 5 days due to dental infection over 2 months ago.  Her stool studies were negative for C. difficile, cultures. Didn't see stool for WBCs. Normal CBC with hemoglobin 12.5 on 07/22/2017 and CMP. RLQ abdominal pain x 5 yrs, got worse over the last few weeks-intermittent, relieved with defecation, negative CT 2016. Has lost 10 pounds over the last 3 months. Worst with pasta, dairy,  Sis with celiac, brother had crohn's. Has been given a trial of cholestyramine with some relief  Denies having any significant upper GI symptoms.  No nausea, heartburn, vomiting, regurgitation, odynophagia or dysphagia.  Never had EGD or colonoscopy performed.   Past Medical History:  Diagnosis Date  . Anxiety     . Arthritis   . Chiari malformation   . Chronic headaches   . Endometriosis   . GERD (gastroesophageal reflux disease)    tums otc, prevacid prn  . Headache   . Low blood pressure    pt states history of low blood pressure-fainted 12/2010-instructed to move slowly  . SLE (systemic lupus erythematosus) ([place]) 03/26/2016    Past Surgical History:  Procedure Laterality Date  . ABDOMINAL HYSTERECTOMY  2007  . CYSTOSCOPY  2007  . DIAGNOSTIC LAPAROSCOPY  2005  . ROTATOR CUFF REPAIR  2010   right shoulder  . SALPINGOOPHORECTOMY  04/16/2011   Procedure: SALPINGO OOPHERECTOMY;  Surgeon: Lovenia Kim, MD;  Location: [facility];  Service: Gynecology;  Laterality: Right;  . skull decompression surgery 2005      Family History  Problem Relation Age of Onset  . Hypertension Mother   . CAD Mother   . Breast cancer Mother   . Migraines Mother   . Gout Mother   . Heart disease Mother   . Crohn's disease Mother   . Hypertension Father   . CAD Father   . CVA Father   . Heart disease Father   . Sjogren's syndrome Sister   . Celiac disease Sister   . Rheum arthritis Maternal Grandmother   . Autism Son   . ADD / ADHD Son   . Pancreatic cancer Maternal Grandfather     Social History   Tobacco Use  . Smoking status: Former Smoker    Packs/day: 1.00    Years: 10.00  Pack years: 10.00    Types: Cigarettes    Last attempt to quit: 04/04/2004    Years since quitting: 13.3  . Smokeless tobacco: Never Used  Substance Use Topics  . Alcohol use: No  . Drug use: No    Current Outpatient Medications  Medication Sig Dispense Refill  . cholestyramine (QUESTRAN) 4 GM/DOSE powder Take 1 packet (4 g total) by mouth 2 (two) times daily as needed. For diarrhea 378 g 12  . Fremanezumab-vfrm (AJOVY) 225 MG/1.5ML SOSY Inject 225 mg into the skin every 30 (thirty) days. 3 Syringe 0  . hydroxychloroquine (PLAQUENIL) 200 MG tablet TAKE ONE TABLET TWO TIMES A DAY MONDAY THROUGH FRIDAY. 40 tablet 0   . ibuprofen (ADVIL,MOTRIN) 800 MG tablet Take by mouth.    Marland Kitchen ketorolac (TORADOL) 10 MG tablet Take 1 tablet (10 mg total) by mouth every 6 (six) hours as needed. Take 1 tablet at onset of headache, may take 3 times daily if needed. Do not take for more than 3 days in a row 30 tablet 3  . Multiple Vitamins-Minerals (MULTIVITAMIN WITH MINERALS) tablet Take by mouth daily.    . ondansetron (ZOFRAN) 4 MG tablet Take 1 tablet (4 mg total) by mouth every 8 (eight) hours as needed for nausea or vomiting. 20 tablet 0  . pantoprazole (PROTONIX) 40 MG tablet Take 1 tablet (40 mg total) by mouth daily. 30 tablet 3  . SUMAtriptan (IMITREX) 100 MG tablet Take 1 tablet (100 mg total) by mouth at onset of migraine. May repeat in 2 hours if headache persists or recurs. Do not take more than 2 doses in 24 hours. 10 tablet 2  . Topiramate ER (QUDEXY XR) 200 MG CS24 sprinkle capsule Take 1 capsule by mouth at bedtime. 90 each 4  . Vitamin D, Ergocalciferol, (DRISDOL) 50000 units CAPS capsule Take 1 capsule (50,000 Units total) by mouth every 7 (seven) days. 12 capsule 0   No current facility-administered medications for this visit.    Facility-Administered Medications Ordered in Other Visits  Medication Dose Route Frequency Provider Last Rate Last Dose  . gadopentetate dimeglumine (MAGNEVIST) injection 15 mL  15 mL Intravenous Once PRN Melvenia Beam, MD        Allergies  Allergen Reactions  . Effexor [Venlafaxine] Nausea Only    insomnia  . Lexapro [Escitalopram Oxalate]     Insomnia, stomach upset    Review of Systems:  Constitutional: Denies fever, chills, diaphoresis, appetite change and fatigue.  HEENT: Denies photophobia, eye pain, redness, hearing loss, ear pain, congestion, sore throat, rhinorrhea, sneezing, mouth sores, neck pain, neck stiffness and tinnitus.   Respiratory: Denies SOB, DOE, cough, chest tightness,  and wheezing.   Cardiovascular: Denies chest pain, palpitations and leg  swelling.  Genitourinary: Denies dysuria, urgency, frequency, hematuria, flank pain and difficulty urinating.  Musculoskeletal: Denies myalgias, back pain, joint swelling, arthralgias and gait problem.  Skin: No rash.  Neurological: Denies dizziness, seizures, syncope, weakness, light-headedness, numbness and headaches.  Hematological: Denies adenopathy. Easy bruising, personal or family bleeding history  Psychiatric/Behavioral: No anxiety or depression     Physical Exam:    BP 108/74   Pulse 64   Ht 5\' 8"  (1.727 m)   Wt 158 lb 4 oz (71.8 kg)   BMI 24.06 kg/m  Filed Weights   08/16/17 1446  Weight: 158 lb 4 oz (71.8 kg)   Constitutional:  Well-developed, in no acute distress. Psychiatric: Normal mood and affect. Behavior is normal. HEENT: Pupils normal.  Conjunctivae are normal. No scleral icterus. Neck supple.  Cardiovascular: Normal rate, regular rhythm. No edema Pulmonary/chest: Effort normal and breath sounds normal. No wheezing, rales or rhonchi. Abdominal: Soft, nondistended. Nontender. Bowel sounds active throughout. There are no masses palpable. No hepatomegaly. Rectal:  defered Neurological: Alert and oriented to person place and time. Skin: Skin is warm and dry. No rashes noted.  Data Reviewed: I have personally reviewed following labs and imaging studies  CBC: CBC Latest Ref Rng & Units 07/22/2017 07/05/2017 11/19/2016  WBC 3.8 - 10.8 Thousand/uL 6.6 5.2 8.0  Hemoglobin 11.7 - 15.5 g/dL 12.5 13.8 12.9  Hematocrit 35.0 - 45.0 % 36.8 41.2 40.1  Platelets 140 - 400 Thousand/uL 281 266 278    CMP: CMP Latest Ref Rng & Units 07/22/2017 07/05/2017 11/19/2016  Glucose 65 - 99 mg/dL 81 83 81  BUN 7 - 25 mg/dL 11 11 12   Creatinine 0.50 - 1.10 mg/dL 0.70 0.78 0.75  Sodium 135 - 146 mmol/L 138 138 138  Potassium 3.5 - 5.3 mmol/L 4.1 3.5 4.3  Chloride 98 - 110 mmol/L 107 106 111(H)  CO2 20 - 32 mmol/L 24 24 18(L)  Calcium 8.6 - 10.2 mg/dL 9.4 10.0 9.5  Total Protein 6.1 -  8.1 g/dL 6.4 8.1 6.7  Total Bilirubin 0.2 - 1.2 mg/dL 0.4 0.5 0.4  Alkaline Phos 33 - 115 U/L - - 56  AST 10 - 30 U/L 14 19 17   ALT 6 - 29 U/L 14 22 15     GFR:   Radiology Studies: US Abdomen Complete  Result Date: 07/22/2017 CLINICAL DATA:  Diarrhea and nausea over the last 2-3 weeks. EXAM: ABDOMEN ULTRASOUND COMPLETE COMPARISON:  CT 09/29/2016 FINDINGS: Gallbladder: No gallstones or wall thickening visualized. No sonographic Murphy sign noted by sonographer. Common bile duct: Diameter: 2 mm common normal Liver: No focal lesion identified. Within normal limits in parenchymal echogenicity. Portal vein is patent on color Doppler imaging with normal direction of blood flow towards the liver. IVC: No abnormality visualized. Pancreas: Visualized portion unremarkable. Spleen: Size and appearance within normal limits. Right Kidney: Length: 12 cm. Echogenicity within normal limits. No mass or hydronephrosis visualized. Left Kidney: Length: 12.7 cm. Echogenicity within normal limits. No mass or hydronephrosis visualized. Abdominal aorta: No aneurysm visualized. Other findings: No ascites IMPRESSION: Normal abdominal ultrasound. No abnormality seen to explain the presenting symptoms. Electronically Signed   By: Nelson Chimes M.D.   On: 07/22/2017 18:02      Carmell Austria, MD   Cc: Debbrah Alar, NP

## 2017-08-16 NOTE — Patient Instructions (Signed)
If you are age 38 or older, your body mass index should be between 23-30. Your Body mass index is 24.06 kg/m. If this is out of the aforementioned range listed, please consider follow up with your Primary Care Provider.  If you are age 42 or younger, your body mass index should be between 19-25. Your Body mass index is 24.06 kg/m. If this is out of the aformentioned range listed, please consider follow up with your Primary Care Provider.   You have been scheduled for a colonoscopy. Please follow written instructions given to you at your visit today.  Please pick up your prep supplies at the pharmacy within the next 1-3 days. If you use inhalers (even only as needed), please bring them with you on the day of your procedure. Your physician has requested that you go to www.startemmi.com and enter the access code given to you at your visit today. This web site gives a general overview about your procedure. However, you should still follow specific instructions given to you by our office regarding your preparation for the procedure.  Please purchase the following medications over the counter and take as directed: Two days before your procedure: Mix 3 packs (or capfuls) of Miralax in 48 ounces of clear liquid and drink at 6pm.  We have sent the following medications to your pharmacy for you to pick up at your convenience: Moviprep  Please stop at the lab before you leave the office today.   Thank you,  Dr. Jackquline Denmark

## 2017-08-17 LAB — TISSUE TRANSGLUTAMINASE, IGA: (tTG) Ab, IgA: 1 U/mL

## 2017-08-18 ENCOUNTER — Telehealth: Payer: Self-pay

## 2017-08-18 ENCOUNTER — Telehealth: Payer: Self-pay | Admitting: Gastroenterology

## 2017-08-18 ENCOUNTER — Encounter: Payer: Self-pay | Admitting: Gastroenterology

## 2017-08-18 LAB — GLIADIN ANTIBODIES, SERUM
Gliadin IgA: 4 Units
Gliadin IgG: 2 Units

## 2017-08-18 NOTE — Telephone Encounter (Signed)
Patients says she would like for nurse to leave detailed test results on her voicemail if she doesn't answer.

## 2017-08-18 NOTE — Telephone Encounter (Signed)
lmtcb for test results.

## 2017-08-18 NOTE — Telephone Encounter (Signed)
I left her a message that the celiac test was negative.

## 2017-08-19 ENCOUNTER — Ambulatory Visit: Payer: Commercial Managed Care - PPO | Admitting: Neurology

## 2017-08-21 LAB — RETICULIN ANTIBODIES, IGA W TITER: Reticulin IGA Screen: NEGATIVE

## 2017-08-22 ENCOUNTER — Ambulatory Visit (AMBULATORY_SURGERY_CENTER): Payer: Commercial Managed Care - PPO | Admitting: Gastroenterology

## 2017-08-22 ENCOUNTER — Other Ambulatory Visit: Payer: Self-pay

## 2017-08-22 ENCOUNTER — Encounter: Payer: Self-pay | Admitting: Gastroenterology

## 2017-08-22 VITALS — BP 97/52 | HR 52 | Temp 98.9°F | Resp 20 | Ht 68.0 in | Wt 158.0 lb

## 2017-08-22 DIAGNOSIS — R197 Diarrhea, unspecified: Secondary | ICD-10-CM

## 2017-08-22 MED ORDER — SODIUM CHLORIDE 0.9 % IV SOLN: 500.0000 mL | Freq: Once | INTRAVENOUS | Status: DC

## 2017-08-22 NOTE — Progress Notes (Signed)
Called to room to assist during endoscopic procedure.  Patient ID and intended procedure confirmed with present staff. Received instructions for my participation in the procedure from the performing physician.  

## 2017-08-22 NOTE — Progress Notes (Signed)
Pt. Reports no change in her medical or surgical history since her OV with Dr. Lyndel Safe 08/16/2017.

## 2017-08-22 NOTE — Progress Notes (Signed)
Report to PACU, RN, vss, BBS= Clear.  

## 2017-08-22 NOTE — Op Note (Signed)
[facility] Patient Name: Kim Pham Procedure Date: 08/22/2017 7:55 AM MRN: [id number] Endoscopist: Jackquline Denmark MD, MD Age: 37 Referring MD:  Date of Birth: 09/10/1980 Gender: Female Account #: [id number] Procedure:                Colonoscopy andom colonic biopsies and terminal                            ileal biopsies. Indications:              Clinically significant diarrhea of unexplained                            origin, H/O Rectal bleeding. family history of                            Crohn's disease-mother. Procedure:                Pre-Anesthesia Assessment:                           - Prior to the procedure, a History and Physical                            was performed, and patient medications and                            allergies were reviewed. The patient is competent.                            The risks and benefits of the procedure and the                            sedation options and risks were discussed with the                            patient. All questions were answered and informed                            consent was obtained. Patient identification and                            proposed procedure were verified by the physician                            in the procedure room. Mental Status Examination:                            alert and oriented. Prophylactic Antibiotics: The                            patient does not require prophylactic antibiotics.                            Prior Anticoagulants: The patient has taken no  previous anticoagulant or antiplatelet agents. ASA                            Grade Assessment: I - A normal, healthy patient.                            After reviewing the risks and benefits, the patient                            was deemed in satisfactory condition to undergo the                            procedure. The anesthesia plan was to use monitored    anesthesia care (MAC). Immediately prior to                            administration of medications, the patient was                            re-assessed for adequacy to receive sedatives. The                            heart rate, respiratory rate, oxygen saturations,                            blood pressure, adequacy of pulmonary ventilation,                            and response to care were monitored throughout the                            procedure. The physical status of the patient was                            re-assessed after the procedure.                           After obtaining informed consent, the colonoscope                            was passed under direct vision. Throughout the                            procedure, the patient's blood pressure, pulse, and                            oxygen saturations were monitored continuously. The                            Colonoscope was introduced through the anus and                            advanced to the 4 cm into the ileum. The  colonoscopy was performed without difficulty. The                            patient tolerated the procedure well. The quality                            of the bowel preparation was excellent. Scope In: 8:09:21 AM Scope Out: 8:24:19 AM Scope Withdrawal Time: 0 hours 10 minutes 3 seconds  Total Procedure Duration: 0 hours 14 minutes 58 seconds  Findings:                 Non-bleeding internal hemorrhoids were found during                            retroflexion. The hemorrhoids were small and Grade                            I (internal hemorrhoids that do not prolapse).                            Biopsies were taken with a cold forceps for                            histology from throughout the colon and from the                            terminal ileum.                           The exam was otherwise without abnormality on                            direct and  retroflexion views. Complications:            No immediate complications. Estimated Blood Loss:     Estimated blood loss: none. Impression:               - Small internal hemorrhoids.                           - Otherwise normal colonoscopy to terminal ileum. Recommendation:           - Patient has a contact number available for                            emergencies. The signs and symptoms of potential                            delayed complications were discussed with the                            patient. Return to normal activities tomorrow.                            Written discharge instructions were provided to the  patient.                           - Resume previous diet.                           - Continue present medications. if still with                            problems, will give her a trial of Bentyl.                           - Await pathology results.                           - Return to GI clinic in 12 weeks. Jackquline Denmark MD, MD 08/22/2017 8:32:44 AM This report has been signed electronically.

## 2017-08-22 NOTE — Patient Instructions (Signed)
HANDOUT GIVEN FOR HEMORRHOIDS  YOU HAD AN ENDOSCOPIC PROCEDURE TODAY AT THE [facility]:   Refer to the procedure report that was given to you for any specific questions about what was found during the examination.  If the procedure report does not answer your questions, please call your gastroenterologist to clarify.  If you requested that your care partner not be given the details of your procedure findings, then the procedure report has been included in a sealed envelope for you to review at your convenience later.  YOU SHOULD EXPECT: Some feelings of bloating in the abdomen. Passage of more gas than usual.  Walking can help get rid of the air that was put into your GI tract during the procedure and reduce the bloating. If you had a lower endoscopy (such as a colonoscopy or flexible sigmoidoscopy) you may notice spotting of blood in your stool or on the toilet paper. If you underwent a bowel prep for your procedure, you may not have a normal bowel movement for a few days.  Please Note:  You might notice some irritation and congestion in your nose or some drainage.  This is from the oxygen used during your procedure.  There is no need for concern and it should clear up in a day or so.  SYMPTOMS TO REPORT IMMEDIATELY:   Following lower endoscopy (colonoscopy or flexible sigmoidoscopy):  Excessive amounts of blood in the stool  Significant tenderness or worsening of abdominal pains  Swelling of the abdomen that is new, acute  Fever of 100F or higher  For urgent or emergent issues, a gastroenterologist can be reached at any hour by calling [phone].   DIET:  We do recommend a small meal at first, but then you may proceed to your regular diet.  Drink plenty of fluids but you should avoid alcoholic beverages for 24 hours.  ACTIVITY:  You should plan to take it easy for the rest of today and you should NOT DRIVE or use heavy machinery until tomorrow (because of the sedation  medicines used during the test).    FOLLOW UP: Our staff will call the number listed on your records the next business day following your procedure to check on you and address any questions or concerns that you may have regarding the information given to you following your procedure. If we do not reach you, we will leave a message.  However, if you are feeling well and you are not experiencing any problems, there is no need to return our call.  We will assume that you have returned to your regular daily activities without incident.  If any biopsies were taken you will be contacted by phone or by letter within the next 1-3 weeks.  Please call us at [phone] if you have not heard about the biopsies in 3 weeks.    SIGNATURES/CONFIDENTIALITY: You and/or your care partner have signed paperwork which will be entered into your electronic medical record.  These signatures attest to the fact that that the information above on your After Visit Summary has been reviewed and is understood.  Full responsibility of the confidentiality of this discharge information lies with you and/or your care-partner. 

## 2017-08-23 ENCOUNTER — Telehealth: Payer: Self-pay

## 2017-08-23 NOTE — Telephone Encounter (Signed)
Per the procedure note, I made a 12 week follow-up appointment and a letter was mailed to the patient.

## 2017-08-23 NOTE — Telephone Encounter (Signed)
  Follow up Call-  Call back number 08/22/2017  Post procedure Call Back phone  # [phone]  Permission to leave phone message Yes  Some recent data might be hidden     No ID on voicemail.  No message left.  Will try again this afternoon. Adreyan Carbajal/Call-back [facility]

## 2017-08-23 NOTE — Telephone Encounter (Signed)
  Follow up Call-  Call back number 08/22/2017  Post procedure Call Back phone  # [phone]  Permission to leave phone message Yes  Some recent data might be hidden     Patient questions:  Do you have a fever, pain , or abdominal swelling? No. Pain Score  0 *  Have you tolerated food without any problems? Yes.    Have you been able to return to your normal activities? Yes.    Do you have any questions about your discharge instructions: Diet   No. Medications  No. Follow up visit  No.  Do you have questions or concerns about your Care? No.  Actions: * If pain score is 4 or above: No action needed, pain <4.

## 2017-08-24 ENCOUNTER — Telehealth: Payer: Self-pay | Admitting: *Deleted

## 2017-08-24 NOTE — Telephone Encounter (Signed)
Received PA request from pharmacy for Qudexy XR 200 mg. Called pt and LVM (ok per DPR) informing her of this and asked for call back to let us know if she has the copay card which should allow her to get it for free. The patient will be able to get a new card if she does not have one.

## 2017-08-25 MED ORDER — TOPIRAMATE ER 100 MG PO SPRINKLE CAP24: 200.0000 mg | EXTENDED_RELEASE_CAPSULE | Freq: Every day | ORAL | 0 refills | Status: DC

## 2017-08-25 NOTE — Telephone Encounter (Signed)
Called pt. She stated that the pharmacy took her copay card previously. Discussed with pt that the copay card should override the insurance block. Can give samples if pharmacy unable to get this filled today.  Called pharmacy. Gave them instructions and help desk number from copay card. They will submit the claim and call the number if needed.

## 2017-08-25 NOTE — Telephone Encounter (Signed)
Pt called stating that she dose have the copay but the pharmacy is telling her they will need approval from her primary insurance before being able to cover the prescription. Pt states she is completley out of medication requesting samples to hold her over if possible. Please call to advise.

## 2017-08-25 NOTE — Telephone Encounter (Signed)
Pt's husband (on DPR, checked ID) picked up Qudexy XR 100 mg samples for the patient.

## 2017-08-25 NOTE — Telephone Encounter (Signed)
Sample of Qudexy XR 100 mg capsules ready for pt pickup, ok per [facility] NP.   Called pt and LVM (ok per DPR) informing her that the samples are ready. Asked pt to call if she will be having someone else pick them up for her. Also informed pt that pharmacy is in process of using copay card for pt's med fill. Left office number for her to call with any questions.

## 2017-08-25 NOTE — Addendum Note (Signed)
Addended by: Gildardo Griffes on: 08/25/2017 09:37 AM   Modules accepted: Orders

## 2017-08-28 ENCOUNTER — Encounter: Payer: Self-pay | Admitting: Gastroenterology

## 2017-08-30 NOTE — Telephone Encounter (Signed)
Waiting on patient consent.

## 2017-09-01 ENCOUNTER — Ambulatory Visit: Payer: Commercial Managed Care - PPO | Admitting: Neurology

## 2017-09-07 ENCOUNTER — Encounter: Payer: Self-pay | Admitting: Neurology

## 2017-10-13 ENCOUNTER — Encounter: Payer: Self-pay | Admitting: Neurology

## 2017-10-14 NOTE — Telephone Encounter (Signed)
Spoke with pt. She has called the help line for Qudexy XR savings card and they were able to active the card however still have to verify something with her insurance company. She was not able to come and get a sample today to hold her over but anticipates things will work out with her pharmacy and insurance so she can get the [place]. She will keep [place] updated. Also, she is interested in trying something different so this will not keep happening in the future. She reports in email that she was previously on Topiramate IR.

## 2017-10-14 NOTE — Telephone Encounter (Signed)
Called Kim Pham and spoke with associate. She was able to get the copay card to go through for 30 day supply. It would not work for the 90 day supply. Pharmacy associate said they would get that ready for the patient now. RN called pt and let her know that the card worked for 30 day supply and will be ready for pickup today. She verbalized appreciation.

## 2017-11-01 ENCOUNTER — Telehealth: Payer: Self-pay | Admitting: *Deleted

## 2017-11-01 NOTE — Telephone Encounter (Signed)
Copied from [place] [phone]. Topic: Medical Record Request - Other >> Nov 01, 2017  3:31 PM Kim Pham wrote: Patient is calling to confirm if we have received paperwork in regards to worker comp and  she stated she is needing her medical records attached to the paper work as well. Please advise

## 2017-11-01 NOTE — Telephone Encounter (Signed)
Ivin Booty -- have you seen this come through?

## 2017-11-02 ENCOUNTER — Telehealth: Payer: Self-pay | Admitting: *Deleted

## 2017-11-02 NOTE — Telephone Encounter (Signed)
Received request for Medical Records from [facility]; forwarded to [place] for email/scan/SLS 07/17

## 2017-11-02 NOTE — Telephone Encounter (Signed)
Notified pt that form was sent to HIM dept for records release.

## 2017-11-02 NOTE — Telephone Encounter (Signed)
Received request for Medical Records from [place]; forwarded to [place] for email/scan/SLS 07/17

## 2017-11-03 ENCOUNTER — Telehealth: Payer: Self-pay | Admitting: Rheumatology

## 2017-11-03 NOTE — Telephone Encounter (Signed)
Patient left a voicemail stating she has an upcoming surgery and is requesting a return call asap.  Patient states this is an urgent matter.

## 2017-11-03 NOTE — Telephone Encounter (Signed)
Patient calling to see if you have received paper work for surgery clearance for upcoming surgery. Per patient Urgent matter. Please call ASAP.

## 2017-11-03 NOTE — Telephone Encounter (Signed)
Received a request for records along with signed release on 10/31/17 from Genia Del @ [facility] requesting records for this pt from 3 yrs prior to date of injury 09/15/17 and I faxed the info/ [phone] I called pt back today and she states that [facility] is stating they have not received the records and she would like to pick them up herself so she can hand deliver them. Printed all records from [place] 2016 to present and put at front desk

## 2017-11-14 ENCOUNTER — Ambulatory Visit: Payer: Self-pay | Admitting: Gastroenterology

## 2017-11-15 ENCOUNTER — Encounter: Payer: Self-pay | Admitting: Gastroenterology

## 2017-11-21 NOTE — Progress Notes (Deleted)
Office Visit Note  Patient: Kim Pham             Date of Birth: March 03, 1981           MRN: [id number]             PCP: Kim Alar, NP Referring: Kim Alar, NP Visit Date: 12/05/2017 Occupation: @GUAROCC @  Subjective:  No chief complaint on file.   History of Present Illness: Kim Pham is a 37 y.o. female ***   Activities of Daily Living:  Patient reports morning stiffness for *** {minute/hour:19697}.   Patient {ACTIONS;DENIES/REPORTS:21021675::"Denies"} nocturnal pain.  Difficulty dressing/grooming: {ACTIONS;DENIES/REPORTS:21021675::"Denies"} Difficulty climbing stairs: {ACTIONS;DENIES/REPORTS:21021675::"Denies"} Difficulty getting out of chair: {ACTIONS;DENIES/REPORTS:21021675::"Denies"} Difficulty using hands for taps, buttons, cutlery, and/or writing: {ACTIONS;DENIES/REPORTS:21021675::"Denies"}  No Rheumatology ROS completed.   PMFS History:  Patient Active Problem List   Diagnosis Date Noted  . Psychogenic nonepileptic seizure 07/27/2017  . Chronic migraine without aura, with intractable migraine, so stated, with status migrainosus 07/27/2017  . Diarrhea 07/08/2017  . Nonepileptic episode ([place]) 12/22/2016  . Sicca syndrome ([place]) 11/22/2016  . Raynaud's disease without gangrene 11/22/2016  . Hair loss 11/22/2016  . Photosensitivity 11/22/2016  . History of Chiari malformation 11/22/2016  . High risk medication use 11/22/2016  . Pain in joint, multiple sites 08/18/2016  . Fatigue 03/26/2016  . SLE (systemic lupus erythematosus) ([place]) 03/26/2016  . Depression with anxiety 03/26/2016  . Numbness and tingling 02/25/2016  . Transient alteration of awareness 01/01/2016  . Chiari malformation 12/19/2015    Past Medical History:  Diagnosis Date  . Anxiety   . Arthritis   . Chiari malformation   . Chronic headaches   . Endometriosis   . GERD (gastroesophageal reflux disease)    tums otc, prevacid prn  . Headache   . Low blood  pressure    pt states history of low blood pressure-fainted 12/2010-instructed to move slowly  . SLE (systemic lupus erythematosus) ([place]) 03/26/2016    Family History  Problem Relation Age of Onset  . Hypertension Mother   . CAD Mother   . Breast cancer Mother   . Migraines Mother   . Gout Mother   . Heart disease Mother   . Crohn's disease Mother   . Hypertension Father   . CAD Father   . CVA Father   . Heart disease Father   . Sjogren's syndrome Sister   . Celiac disease Sister   . Rheum arthritis Maternal Grandmother   . Autism Son   . ADD / ADHD Son   . Pancreatic cancer Maternal Grandfather    Past Surgical History:  Procedure Laterality Date  . ABDOMINAL HYSTERECTOMY  2007  . CYSTOSCOPY  2007  . DIAGNOSTIC LAPAROSCOPY  2005  . ROTATOR CUFF REPAIR  2010   right shoulder  . SALPINGOOPHORECTOMY  04/16/2011   Procedure: SALPINGO OOPHERECTOMY;  Surgeon: Lovenia Kim, MD;  Location: [facility];  Service: Gynecology;  Laterality: Right;  . skull decompression surgery 2005     Social History   Social History Narrative   Works as an Corporate treasurer @ [employer]   Married   1 son- born 2004 has autism   Enjoys exercise, special olympics with son   Drinks 4-5 cups of coffee a day     Objective: Vital Signs: There were no vitals taken for this visit.   Physical Exam   Musculoskeletal Exam: ***  CDAI Exam: No CDAI exam completed.   Investigation: No additional findings.  Imaging: No  results found.  Recent Labs: Lab Results  Component Value Date   WBC 6.6 07/22/2017   HGB 12.5 07/22/2017   PLT 281 07/22/2017   NA 138 07/22/2017   K 4.1 07/22/2017   CL 107 07/22/2017   CO2 24 07/22/2017   GLUCOSE 81 07/22/2017   BUN 11 07/22/2017   CREATININE 0.70 07/22/2017   BILITOT 0.4 07/22/2017   ALKPHOS 56 11/19/2016   AST 14 07/22/2017   ALT 14 07/22/2017   PROT 6.4 07/22/2017   ALBUMIN 4.4 11/19/2016   CALCIUM 9.4 07/22/2017   GFRAA 113 07/05/2017     Speciality Comments: No specialty comments available.  Procedures:  No procedures performed Allergies: Effexor [venlafaxine] and Lexapro [escitalopram oxalate]   Assessment / Plan:     Visit Diagnoses: Autoimmune disease ([place]) - 1:320 homogeneous, h/oFatigue, oral ulcers, sicca symptoms, arthralgias, photosensitivity, Raynaud's, hair loss  High risk medication use -  PLQ 200 mg by mouth BID M-Feye exam August 2018  Other fatigue  Sicca syndrome, unspecified ([place])  Raynaud's syndrome without gangrene  Hair loss  Photosensitivity  History of Chiari malformation  History of gastroesophageal reflux (GERD)  History of depression  Recurrent oral ulcers   Orders: No orders of the defined types were placed in this encounter.  No orders of the defined types were placed in this encounter.   Face-to-face time spent with patient was *** minutes. Greater than 50% of time was spent in counseling and coordination of care.  Follow-Up Instructions: No follow-ups on file.   Ofilia Neas, PA-C  Note - This record has been created using Dragon software.  Chart creation errors have been sought, but may not always  have been located. Such creation errors do not reflect on  the standard of medical care.

## 2017-12-05 ENCOUNTER — Ambulatory Visit: Payer: Commercial Managed Care - PPO | Admitting: Physician Assistant

## 2018-01-27 ENCOUNTER — Ambulatory Visit: Payer: Commercial Managed Care - PPO | Admitting: Family

## 2018-01-27 VITALS — BP 107/57 | HR 81 | Temp 98.9°F | Resp 16 | Ht 69.0 in | Wt 167.0 lb

## 2018-01-27 DIAGNOSIS — F418 Other specified anxiety disorders: Secondary | ICD-10-CM

## 2018-01-27 MED ORDER — FLUOXETINE HCL 10 MG PO TABS: ORAL_TABLET | ORAL | 0 refills | Status: DC

## 2018-01-27 NOTE — Patient Instructions (Signed)
Please begin prozac 1/2 tab once daily for 1 week, then increase to a full tab on week two.

## 2018-01-27 NOTE — Progress Notes (Signed)
Subjective:    Patient ID: Kim Pham, female    DOB: Dec 24, 1980, 37 y.o.   MRN: [id number]  HPI  Patient is a 37 yr old female who presents today for follow up.  She reports that she suffered a RTC tear and bicep tear at work.  This was repaired in August.  She had a stressful struggle with Workmen's Comp. and ultimately this was paid for by private insurance.  She just returnted to work.  This has worsened her anxiety/depression/anger.  She feels like she went back to work too soon but had no choice because of her leave running out. She has stress at home with her husband who has chronic medical problems and her son who is autistic.  Not eating like she should be.  Wakes up with racing thoughts then unable to go back to sleep. Trouble focusing at work.  Tearfulness.   Review of Systems    see HPI  Past Medical History:  Diagnosis Date  . Anxiety   . Arthritis   . Chiari malformation   . Chronic headaches   . Endometriosis   . GERD (gastroesophageal reflux disease)    tums otc, prevacid prn  . Headache   . Low blood pressure    pt states history of low blood pressure-fainted 12/2010-instructed to move slowly  . SLE (systemic lupus erythematosus) ([place]) 03/26/2016     Social History   Socioeconomic History  . Marital status: Married    Spouse name: Not on file  . Number of children: 1  . Years of education: Assoc  . Highest education level: Not on file  Occupational History  . Occupation: nurse  Social Needs  . Financial resource strain: Not on file  . Food insecurity:    Worry: Not on file    Inability: Not on file  . Transportation needs:    Medical: Not on file    Non-medical: Not on file  Tobacco Use  . Smoking status: Former Smoker    Packs/day: 1.00    Years: 10.00    Pack years: 10.00    Types: Cigarettes    Last attempt to quit: 04/04/2004    Years since quitting: 13.8  . Smokeless tobacco: Never Used  Substance and Sexual Activity  . Alcohol use:  No  . Drug use: No  . Sexual activity: Not on file  Lifestyle  . Physical activity:    Days per week: Not on file    Minutes per session: Not on file  . Stress: Not on file  Relationships  . Social connections:    Talks on phone: Not on file    Gets together: Not on file    Attends religious service: Not on file    Active member of club or organization: Not on file    Attends meetings of clubs or organizations: Not on file    Relationship status: Not on file  . Intimate partner violence:    Fear of current or ex partner: Not on file    Emotionally abused: Not on file    Physically abused: Not on file    Forced sexual activity: Not on file  Other Topics Concern  . Not on file  Social History Narrative   Works as an LPN @ [employer]   Married   1 son- born 2004 has autism   Enjoys exercise, special olympics with son   Drinks 4-5 cups of coffee a day     Past Surgical  History:  Procedure Laterality Date  . ABDOMINAL HYSTERECTOMY  2007  . CYSTOSCOPY  2007  . DIAGNOSTIC LAPAROSCOPY  2005  . ROTATOR CUFF REPAIR  2010   right shoulder  . SALPINGOOPHORECTOMY  04/16/2011   Procedure: SALPINGO OOPHERECTOMY;  Surgeon: Lovenia Kim, MD;  Location: [facility];  Service: Gynecology;  Laterality: Right;  . skull decompression surgery 2005      Family History  Problem Relation Age of Onset  . Hypertension Mother   . CAD Mother   . Breast cancer Mother   . Migraines Mother   . Gout Mother   . Heart disease Mother   . Crohn's disease Mother   . Hypertension Father   . CAD Father   . CVA Father   . Heart disease Father   . Sjogren's syndrome Sister   . Celiac disease Sister   . Rheum arthritis Maternal Grandmother   . Autism Son   . ADD / ADHD Son   . Pancreatic cancer Maternal Grandfather     Allergies  Allergen Reactions  . Effexor [Venlafaxine] Nausea Only    insomnia  . Lexapro [Escitalopram Oxalate]     Insomnia, stomach upset    Current Outpatient  Medications on File Prior to Visit  Medication Sig Dispense Refill  . hydroxychloroquine (PLAQUENIL) 200 MG tablet TAKE ONE TABLET TWO TIMES A DAY MONDAY THROUGH FRIDAY. 40 tablet 0  . ibuprofen (ADVIL,MOTRIN) 800 MG tablet Take by mouth.    Marland Kitchen ketorolac (TORADOL) 10 MG tablet Take 1 tablet (10 mg total) by mouth every 6 (six) hours as needed. Take 1 tablet at onset of headache, may take 3 times daily if needed. Do not take for more than 3 days in a row 30 tablet 3  . Multiple Vitamins-Minerals (MULTIVITAMIN WITH MINERALS) tablet Take by mouth daily.    . ondansetron (ZOFRAN) 4 MG tablet Take 1 tablet (4 mg total) by mouth every 8 (eight) hours as needed for nausea or vomiting. 20 tablet 0  . pantoprazole (PROTONIX) 40 MG tablet Take 1 tablet (40 mg total) by mouth daily. 30 tablet 3  . SUMAtriptan (IMITREX) 100 MG tablet Take 1 tablet (100 mg total) by mouth at onset of migraine. May repeat in 2 hours if headache persists or recurs. Do not take more than 2 doses in 24 hours. 10 tablet 2  . topiramate ER (QUDEXY XR) CS24 sprinkle capsule Take 2 capsules (200 mg total) by mouth at bedtime. 30 each 0  . Vitamin D, Ergocalciferol, (DRISDOL) 50000 units CAPS capsule Take 1 capsule (50,000 Units total) by mouth every 7 (seven) days. 12 capsule 0   Current Facility-Administered Medications on File Prior to Visit  Medication Dose Route Frequency Provider Last Rate Last Dose  . 0.9 %  sodium chloride infusion  500 mL Intravenous Once Jackquline Denmark, MD      . gadopentetate dimeglumine (MAGNEVIST) injection 15 mL  15 mL Intravenous Once PRN Melvenia Beam, MD        BP (!) 107/57 (BP Location: Right Arm, Patient Position: Sitting, Cuff Size: Small)   Pulse 81   Temp 98.9 F (37.2 C) (Oral)   Resp 16   Ht 5\' 9"  (1.753 m)   Wt 167 lb (75.8 kg)   SpO2 99%   BMI 24.66 kg/m    Objective:   Physical Exam  Constitutional: She appears well-developed and well-nourished.  Skin: Skin is warm and dry.    Psychiatric: Her behavior is normal. Judgment  and thought content normal.  tearful          Assessment & Plan:  Depression/anxiety- uncontrolled.  Advised her to consider beginning counseling and I gave her the information to call and schedule an appointment.  Will also give trial of prozac. She has had some nausea and insomnia in the past on lexapro but will see if she may be better able to tolerate this.  Also suggested trial of melatonin HS for sleep. If insomnia does not improve, would consider trial of trazodone.  A total of 15  minutes were spent face-to-face with the patient during this encounter and over half of that time was spent on counseling and coordination of care. The patient was counseled on depression/anxiety

## 2018-02-08 ENCOUNTER — Encounter: Payer: Self-pay | Admitting: Family

## 2018-02-08 MED ORDER — HYDROXYZINE HCL 25 MG PO TABS: 25.0000 mg | ORAL_TABLET | Freq: Three times a day (TID) | ORAL | 0 refills | Status: DC | PRN

## 2018-02-08 MED ORDER — FLUOXETINE HCL 20 MG PO TABS: 20.0000 mg | ORAL_TABLET | Freq: Every day | ORAL | 3 refills | Status: DC

## 2018-02-22 ENCOUNTER — Other Ambulatory Visit: Payer: Self-pay | Admitting: Family

## 2018-02-22 DIAGNOSIS — F418 Other specified anxiety disorders: Secondary | ICD-10-CM

## 2018-03-03 ENCOUNTER — Ambulatory Visit: Payer: Commercial Managed Care - PPO | Admitting: Family

## 2018-07-10 ENCOUNTER — Encounter: Payer: Self-pay | Admitting: Physician Assistant

## 2018-07-10 ENCOUNTER — Telehealth: Payer: Commercial Managed Care - PPO | Admitting: Physician Assistant

## 2018-07-10 ENCOUNTER — Encounter: Payer: Self-pay | Admitting: Family

## 2018-07-10 ENCOUNTER — Ambulatory Visit: Payer: Self-pay | Admitting: Family

## 2018-07-10 DIAGNOSIS — R0602 Shortness of breath: Secondary | ICD-10-CM

## 2018-07-10 DIAGNOSIS — R6889 Other general symptoms and signs: Secondary | ICD-10-CM

## 2018-07-10 MED ORDER — ALBUTEROL SULFATE HFA 108 (90 BASE) MCG/ACT IN AERS: 2.0000 | INHALATION_SPRAY | RESPIRATORY_TRACT | 0 refills | Status: DC | PRN

## 2018-07-10 MED ORDER — BENZONATATE 100 MG PO CAPS: 100.0000 mg | ORAL_CAPSULE | Freq: Three times a day (TID) | ORAL | 0 refills | Status: DC | PRN

## 2018-07-10 NOTE — Telephone Encounter (Signed)
FYI

## 2018-07-10 NOTE — Progress Notes (Signed)
E-Visit for Corona Virus Screening  Based on your current symptoms, you may very well have the virus, however your symptoms are mild. Currently, not all patients are being tested. If the symptoms are mild and there is not a known exposure, performing the test is not indicated.  Coronavirus disease 2019 (COVID-19) is a respiratory illness that can spread from person to person. The virus that causes COVID-19 is a new virus that was first identified in the country of [place] but is now found in multiple other countries and has spread to the [place].  Symptoms associated with the virus are mild to severe fever, cough, and shortness of breath. There is currently no vaccine to protect against COVID-19, and there is no specific antiviral treatment for the virus.   To be considered HIGH RISK for Coronavirus (COVID-19), you have to meet the following criteria:  . Traveled to [place], [place], [place], [place] or [place]; or in the [place] to [place], [place], [place], or [place]; and have fever, cough, and shortness of breath within the last 2 weeks of travel OR  . Been in close contact with a person diagnosed with COVID-19 within the last 2 weeks and have fever, cough, and shortness of breath  . IF YOU DO NOT MEET THESE CRITERIA, YOU ARE CONSIDERED LOW RISK FOR COVID-19.   It is vitally important that if you feel that you have an infection such as this virus or any other virus that you stay home and away from places where you may spread it to others.  You should self-quarantine for 14 days if you have symptoms that could potentially be coronavirus and avoid contact with people age 83 and older.   You can use medication such as A prescription cough medication called Tessalon Perles 100 mg. You may take 1-2 capsules every 8 hours as needed for cough and A prescription inhaler called Albuterol MDI 90 mcg /actuation 2 puffs every 4 hours as needed for shortness of breath, wheezing, cough  You may  also take acetaminophen (Tylenol) as needed for fever.   Reduce your risk of any infection by using the same precautions used for avoiding the common cold or flu:  Marland Kitchen Wash your hands often with soap and warm water for at least 20 seconds.  If soap and water are not readily available, use an alcohol-based hand sanitizer with at least 60% alcohol.  . If coughing or sneezing, cover your mouth and nose by coughing or sneezing into the elbow areas of your shirt or coat, into a tissue or into your sleeve (not your hands). . Avoid shaking hands with others and consider head nods or verbal greetings only. . Avoid touching your eyes, nose, or mouth with unwashed hands.  . Avoid close contact with people who are sick. . Avoid places or events with large numbers of people in one location, like concerts or sporting events. . Carefully consider travel plans you have or are making. . If you are planning any travel outside or inside the [place], visit the CDC's Travelers' Health webpage for the latest health notices. . If you have some symptoms but not all symptoms, continue to monitor at home and seek medical attention if your symptoms worsen. . If you are having a medical emergency, call 911.  HOME CARE . Only take medications as instructed by your medical team. . Drink plenty of fluids and get plenty of rest. . A steam or ultrasonic humidifier can help if you  have congestion.   GET HELP RIGHT AWAY IF: . You develop worsening fever. . You become short of breath . You cough up blood. . Your symptoms become more severe MAKE SURE YOU   Understand these instructions.  Will watch your condition.  Will get help right away if you are not doing well or get worse.  Your e-visit answers were reviewed by a board certified advanced clinical practitioner to complete your personal care plan.  Depending on the condition, your plan could have included both over the counter or prescription medications.  If there is a  problem please reply once you have received a response from your provider. Your safety is important to [place].  If you have drug allergies check your prescription carefully.    You can use MyChart to ask questions about today's visit, request a non-urgent call back, or ask for a work or school excuse for 24 hours related to this e-Visit. If it has been greater than 24 hours you will need to follow up with your provider, or enter a new e-Visit to address those concerns. You will get an e-mail in the next two days asking about your experience.  I hope that your e-visit has been valuable and will speed your recovery. Thank you for using e-visits.   I have spent 7 min in completion and review of this note- Lacy Duverney [facility]

## 2018-07-10 NOTE — Telephone Encounter (Signed)
Noted  

## 2018-07-10 NOTE — Progress Notes (Signed)
Based on what you shared with me, I feel your condition warrants further evaluation and I recommend that you be seen for a face to face office visit.  Multiple Evisit submitted with drop charge of this evisit.    NOTE: If you entered your credit card information for this eVisit, you will not be charged. You may see a "hold" on your card for the $35 but that hold will drop off and you will not have a charge processed.  If you are having a true medical emergency please call 911.  If you need an urgent face to face visit, [facility] has four urgent care centers for your convenience.    PLEASE NOTE: THE [facility] LOCATIONS AND URGENT CARE CLINICS DO NOT HAVE THE TESTING FOR CORONAVIRUS COVID19 AVAILABLE.  IF YOU FEEL YOU NEED THIS TEST YOU MUST HAVE AN ORDER TO GO TO A TESTING LOCATION FROM YOUR PROVIDER OR FROM A SCREENING E-VISIT     [email or web address] to reserve your spot online an avoid wait times  [facility] [address], [address] [place], [place] [address] 8 am to 8 pm Monday-Friday 10 am to 4 pm Saturday-Sunday *Across the street from [employer]  [place], [address] 8 am to 5 pm Monday-Friday * In the [facility] on the [facility] [place]   The following sites will take your insurance:  . [facility] [place] a Provider at this Location  [address] [place], [place] [address] . 10 am to 8 pm Monday-Friday . 12 pm to 8 pm Saturday-Sunday   . [facility] [place] a Provider at this Location  [address] [place], [place], [place] [address] . 8 am to 8 pm Monday-Friday . 9 am to 6 pm Saturday . 11 am to 6 pm Sunday   . [facility] [place] Get Driving Directions  [address] [place], [place] [address] . 8 am to 8  pm Monday-Friday . 8 am to 4 pm Saturday-Sunday   Your e-visit answers were reviewed by a board certified advanced clinical practitioner to complete your personal care plan.  Thank you for using e-Visits.

## 2018-07-10 NOTE — Telephone Encounter (Signed)
Pt. Reports she had a virtual visit and was told to see her PCP. Practice recommends pt. Quarantine and monitor her symptoms. Instructed per Caitlyn in the practice if she has increased shortness of breath to go to ED for evaluation. Verbalizes understanding. Will quarantine x 14 days.   Answer Assessment - Initial Assessment Questions 1. CONFIRMED CASE: "Who is the person with the confirmed COVID-19 infection that you were exposed to?"     n/a 2. PLACE of CONTACT: "Where were you when you were exposed to COVID-19  (coronavirus disease 2019)?" (e.g., city, state, country)     n/a 3. TYPE of CONTACT: "How much contact was there?" (e.g., live in same house, work in same office, same school)     n/a 4. DATE of CONTACT: "When did you have contact with a coronavirus patient?" (e.g., days)     n/a 5. DURATION of CONTACT: "How long were you in contact with the COVID-19 (coronavirus disease) patient?" (e.g., a few seconds, passed by person, a few minutes, live with the patient)    n/a 6. SYMPTOMS: "Do you have any symptoms?" (e.g., fever, cough, breathing difficulty)     Low grade fever,cough, shortness of breath, sore throat 7. PREGNANCY OR POSTPARTUM: "Is there any chance you are pregnant?" "When was your last menstrual period?" "Did you deliver in the last 2 weeks?"     No 8. HIGH RISK: "Do you have any heart or lung problems? Do you have a weakened immune system?" (e.g., CHF, COPD, asthma, HIV positive, chemotherapy, renal failure, diabetes mellitus, sickle cell anemia)     Lupus  Protocols used: CORONAVIRUS (COVID-19) EXPOSURE-A-AH

## 2018-07-13 ENCOUNTER — Encounter: Payer: Self-pay | Admitting: Family

## 2018-07-14 ENCOUNTER — Other Ambulatory Visit: Payer: Self-pay

## 2018-07-14 ENCOUNTER — Emergency Department (HOSPITAL_COMMUNITY): Payer: Self-pay

## 2018-07-14 ENCOUNTER — Encounter (HOSPITAL_COMMUNITY): Payer: Self-pay | Admitting: Emergency Medicine

## 2018-07-14 ENCOUNTER — Ambulatory Visit (INDEPENDENT_AMBULATORY_CARE_PROVIDER_SITE_OTHER): Payer: Self-pay | Admitting: Family

## 2018-07-14 ENCOUNTER — Emergency Department (HOSPITAL_COMMUNITY)
Admission: EM | Admit: 2018-07-14 | Discharge: 2018-07-14 | Disposition: A | Discharge status: Home / Self Care | Payer: Self-pay | Attending: Emergency Medicine | Admitting: Emergency Medicine

## 2018-07-14 DIAGNOSIS — R69 Illness, unspecified: Secondary | ICD-10-CM

## 2018-07-14 DIAGNOSIS — R05 Cough: Secondary | ICD-10-CM

## 2018-07-14 DIAGNOSIS — Z87891 Personal history of nicotine dependence: Secondary | ICD-10-CM | POA: Insufficient documentation

## 2018-07-14 DIAGNOSIS — J111 Influenza due to unidentified influenza virus with other respiratory manifestations: Secondary | ICD-10-CM | POA: Insufficient documentation

## 2018-07-14 DIAGNOSIS — R0602 Shortness of breath: Secondary | ICD-10-CM

## 2018-07-14 DIAGNOSIS — R059 Cough, unspecified: Secondary | ICD-10-CM

## 2018-07-14 DIAGNOSIS — Z79899 Other long term (current) drug therapy: Secondary | ICD-10-CM | POA: Insufficient documentation

## 2018-07-14 LAB — BASIC METABOLIC PANEL
Anion gap: 8 (ref 5–15)
BUN: 13 mg/dL (ref 6–20)
CO2: 21 mmol/L — ABNORMAL LOW (ref 22–32)
Calcium: 9.1 mg/dL (ref 8.9–10.3)
Chloride: 111 mmol/L (ref 98–111)
Creatinine, Ser: 0.76 mg/dL (ref 0.44–1.00)
GFR calc Af Amer: >60 mL/min (ref >60)
GFR calc non Af Amer: >60 mL/min (ref >60)
Glucose, Bld: 94 mg/dL (ref 70–99)
Potassium: 3.7 mmol/L (ref 3.5–5.1)
Sodium: 140 mmol/L (ref 135–145)

## 2018-07-14 LAB — CBC WITH DIFFERENTIAL/PLATELET
Abs Immature Granulocytes: 0.02 10*3/uL (ref 0.00–0.07)
Basophils Absolute: 0.1 10*3/uL (ref 0.0–0.1)
Basophils Relative: 1 %
Eosinophils Absolute: 0.1 10*3/uL (ref 0.0–0.5)
Eosinophils Relative: 1 %
HCT: 40.2 % (ref 36.0–46.0)
Hemoglobin: 12.7 g/dL (ref 12.0–15.0)
Immature Granulocytes: 0 %
Lymphocytes Relative: 21 %
Lymphs Abs: 1.5 10*3/uL (ref 0.7–4.0)
MCH: 29.1 pg (ref 26.0–34.0)
MCHC: 31.6 g/dL (ref 30.0–36.0)
MCV: 92 fL (ref 80.0–100.0)
Monocytes Absolute: 0.3 10*3/uL (ref 0.1–1.0)
Monocytes Relative: 5 %
Neutro Abs: 5.4 10*3/uL (ref 1.7–7.7)
Neutrophils Relative %: 72 %
Platelets: 282 10*3/uL (ref 150–400)
RBC: 4.37 MIL/uL (ref 3.87–5.11)
RDW: 12.3 % (ref 11.5–15.5)
WBC: 7.4 10*3/uL (ref 4.0–10.5)
nRBC: 0 % (ref 0.0–0.2)

## 2018-07-14 LAB — I-STAT BETA HCG BLOOD, ED (MC, WL, AP ONLY): I-stat hCG, quantitative: <5 m[IU]/mL (ref <5)

## 2018-07-14 MED ORDER — SODIUM CHLORIDE (PF) 0.9 % IJ SOLN
INTRAMUSCULAR | Status: AC
  Filled 2018-07-14: qty 50

## 2018-07-14 MED ORDER — SODIUM CHLORIDE 0.9 % IV BOLUS (SEPSIS)
1000.0000 mL | Freq: Once | INTRAVENOUS | Status: AC
  Administered 2018-07-14: 1000 mL via INTRAVENOUS

## 2018-07-14 MED ORDER — PREDNISONE 10 MG PO TABS: 40.0000 mg | ORAL_TABLET | Freq: Every day | ORAL | 0 refills | Status: AC

## 2018-07-14 MED ORDER — IOHEXOL 350 MG/ML SOLN
100.0000 mL | Freq: Once | INTRAVENOUS | Status: AC | PRN
  Administered 2018-07-14: 100 mL via INTRAVENOUS

## 2018-07-14 MED ORDER — PREDNISONE 20 MG PO TABS
60.0000 mg | ORAL_TABLET | Freq: Once | ORAL | Status: AC
  Administered 2018-07-14: 60 mg via ORAL
  Filled 2018-07-14: qty 3

## 2018-07-14 MED ORDER — BENZONATATE 100 MG PO CAPS: 100.0000 mg | ORAL_CAPSULE | Freq: Three times a day (TID) | ORAL | 0 refills | Status: DC | PRN

## 2018-07-14 MED ORDER — SODIUM CHLORIDE 0.9 % IV BOLUS
1000.0000 mL | Freq: Once | INTRAVENOUS | Status: AC
  Administered 2018-07-14: 1000 mL via INTRAVENOUS

## 2018-07-14 NOTE — ED Notes (Signed)
O2 97% Pulse 127 while ambulating

## 2018-07-14 NOTE — ED Provider Notes (Signed)
[facility] DEPT Provider Note   CSN: [id number] Arrival date & time: 07/14/18  1458    History   Chief Complaint Chief Complaint  Patient presents with   Fever   Cough   Shortness of Breath    HPI GRACIA SAGGESE is a 38 y.o. female with history of SLE, Chiari malformation, endometriosis, GERD, anxiety, arthritis, Raynaud's disease presenting for evaluation of acute onset, progressively worsening shortness of breath, cough, fever for 5 days.  Reports cough is nonproductive, initially associated with some nausea and sore throat but this has resolved.  Maximum temperature 101 F at home.  Also notes aching chest pains which worsen with movement and cough.  As of breath worsens with any activity and she reports that she has been monitoring her SPO2 saturations at home with a pulse oximeter and they have been in the mid to low 80s with ambulation.  Denies vomiting, abdominal pain, nasal congestion.  She is a non-smoker, denies alcohol or drug use.  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  She called her PCP on 07/10/2018 who recommended self quarantine with concern for possible COVID-19 infection in the midst of the coronavirus pandemic.  She was able to obtain COVID-19 testing at an outside facility and also notes she was flu negative.  She has been using Tylenol with improvement in fever and an albuterol inhaler without relief of her shortness of breath.     The history is provided by the patient.    Past Medical History:  Diagnosis Date   Anxiety    Arthritis    Chiari malformation    Chronic headaches    Endometriosis    GERD (gastroesophageal reflux disease)    tums otc, prevacid prn   Headache    Low blood pressure    pt states history of low blood pressure-fainted 12/2010-instructed to move slowly   SLE (systemic lupus erythematosus) ([place]) 03/26/2016    Patient Active Problem List    Diagnosis Date Noted   Psychogenic nonepileptic seizure 07/27/2017   Chronic migraine without aura, with intractable migraine, so stated, with status migrainosus 07/27/2017   Diarrhea 07/08/2017   Nonepileptic episode ([place]) 12/22/2016   Sicca syndrome ([place]) 11/22/2016   Raynaud's disease without gangrene 11/22/2016   Hair loss 11/22/2016   Photosensitivity 11/22/2016   History of Chiari malformation 11/22/2016   High risk medication use 11/22/2016   Pain in joint, multiple sites 08/18/2016   Fatigue 03/26/2016   SLE (systemic lupus erythematosus) ([place]) 03/26/2016   Depression with anxiety 03/26/2016   Numbness and tingling 02/25/2016   Transient alteration of awareness 01/01/2016   Chiari malformation 12/19/2015    Past Surgical History:  Procedure Laterality Date   ABDOMINAL HYSTERECTOMY  2007   CYSTOSCOPY  2007   DIAGNOSTIC LAPAROSCOPY  2005   ROTATOR CUFF REPAIR  2010   right shoulder   SALPINGOOPHORECTOMY  04/16/2011   Procedure: SALPINGO OOPHERECTOMY;  Surgeon: Lovenia Kim, MD;  Location: [facility];  Service: Gynecology;  Laterality: Right;   skull decompression surgery 2005       OB History   No obstetric history on file.      Home Medications    Prior to Admission medications   Medication Sig Start Date End Date Taking? Authorizing Provider  albuterol (PROVENTIL HFA;VENTOLIN HFA) 108 (90 Base) MCG/ACT inhaler Inhale 2 puffs into the lungs every 4 (four) hours as needed for wheezing or shortness  of breath. 07/10/18  Yes Alene Mires, Sahar M, PA-C  benzonatate (TESSALON) 100 MG capsule Take 1 capsule (100 mg total) by mouth 3 (three) times daily as needed for cough. 07/10/18  Yes Alene Mires, Sahar M, PA-C  hydroxychloroquine (PLAQUENIL) 200 MG tablet TAKE ONE TABLET TWO TIMES A DAY MONDAY THROUGH FRIDAY. Patient taking differently: Take 200 mg by mouth as directed. TAKE 200mg  TWO TIMES A DAY MONDAY THROUGH FRIDAY. 07/05/17  Yes Deveshwar, Abel Presto, MD    ibuprofen (ADVIL,MOTRIN) 200 MG tablet Take 600 mg by mouth every 6 (six) hours as needed for headache or mild pain.   Yes [provider]  ketorolac (TORADOL) 10 MG tablet Take 1 tablet (10 mg total) by mouth every 6 (six) hours as needed. Take 1 tablet at onset of headache, may take 3 times daily if needed. Do not take for more than 3 days in a row 12/22/16  Yes Melvenia Beam, MD  Multiple Vitamins-Minerals (MULTIVITAMIN WITH MINERALS) tablet Take by mouth daily.   Yes [provider]  ondansetron (ZOFRAN) 4 MG tablet Take 1 tablet (4 mg total) by mouth every 8 (eight) hours as needed for nausea or vomiting. 07/08/17  Yes Rosemarie Ax, MD  SUMAtriptan (IMITREX) 100 MG tablet Take 1 tablet (100 mg total) by mouth at onset of migraine. May repeat in 2 hours if headache persists or recurs. Do not take more than 2 doses in 24 hours. 06/29/17  Yes Melvenia Beam, MD  topiramate ER (QUDEXY XR) CS24 sprinkle capsule Take 2 capsules (200 mg total) by mouth at bedtime. 08/25/17  Yes Melvenia Beam, MD  FLUoxetine (PROZAC) 20 MG tablet Take 1 tablet (20 mg total) by mouth daily. Patient not taking: Reported on 07/14/2018 02/08/18   Debbrah Alar, NP  hydrOXYzine (ATARAX/VISTARIL) 25 MG tablet Take 1 tablet (25 mg total) by mouth 3 (three) times daily as needed. For anxiety Patient not taking: Reported on 07/14/2018 02/08/18   Debbrah Alar, NP  pantoprazole (PROTONIX) 40 MG tablet Take 1 tablet (40 mg total) by mouth daily. Patient not taking: Reported on 07/14/2018 08/16/16   Debbrah Alar, NP  predniSONE (DELTASONE) 10 MG tablet Take 4 tablets (40 mg total) by mouth daily with breakfast for 4 days. 07/14/18 07/18/18  Rodell Perna A, PA-C  Vitamin D, Ergocalciferol, (DRISDOL) 50000 units CAPS capsule Take 1 capsule (50,000 Units total) by mouth every 7 (seven) days. Patient not taking: Reported on 07/14/2018 07/13/17   Bo Merino, MD    Family History Family  History  Problem Relation Age of Onset   Hypertension Mother    CAD Mother    Breast cancer Mother    Migraines Mother    Gout Mother    Heart disease Mother    Crohn's disease Mother    Hypertension Father    CAD Father    CVA Father    Heart disease Father    Sjogren's syndrome Sister    Celiac disease Sister    Rheum arthritis Maternal Grandmother    Autism Son    ADD / ADHD Son    Pancreatic cancer Maternal Grandfather     Social History Social History   Tobacco Use   Smoking status: Former Smoker    Packs/day: 1.00    Years: 10.00    Pack years: 10.00    Types: Cigarettes    Last attempt to quit: 04/04/2004    Years since quitting: 14.2   Smokeless tobacco: Never Used  Substance Use  Topics   Alcohol use: No   Drug use: No     Allergies   Effexor [venlafaxine]   Review of Systems Review of Systems  Constitutional: Positive for chills and fever.  HENT: Positive for sore throat (resolved).   Respiratory: Positive for cough and shortness of breath.   Cardiovascular: Positive for chest pain. Negative for leg swelling.  Gastrointestinal: Positive for nausea. Negative for abdominal pain and vomiting.  Musculoskeletal: Positive for myalgias.  All other systems reviewed and are negative.    Physical Exam Updated Vital Signs BP 108/65    Pulse 79    Temp 98.4 F (36.9 C) (Oral)    Resp (!) 21    Ht 5\' 9"  (1.753 m)    Wt 79.4 kg    SpO2 99%    BMI 25.84 kg/m   Physical Exam Vitals signs and nursing note reviewed.  Constitutional:      General: She is not in acute distress.    Appearance: She is well-developed.  HENT:     Head: Normocephalic and atraumatic.  Eyes:     General:        Right eye: No discharge.        Left eye: No discharge.     Conjunctiva/sclera: Conjunctivae normal.  Neck:     Musculoskeletal: Normal range of motion and neck supple.     Vascular: No JVD.     Trachea: No tracheal deviation.  Cardiovascular:      Rate and Rhythm: Normal rate and regular rhythm.     Pulses: Normal pulses.     Comments: 2+ radial and DP/PT pulses bilaterally, Homans sign absent bilaterally, no lower extremity edema, no palpable cords, compartments are soft  Pulmonary:     Effort: Pulmonary effort is normal.     Breath sounds: Decreased breath sounds present.     Comments: Speaking in full sentences without difficulty.  SPO2 saturations 93-96% on room air at rest Chest:     Chest wall: Tenderness present.     Comments: Diffuse tenderness to palpation of the chest wall with no deformity, crepitus, ecchymosis, or flail segment Abdominal:     General: Bowel sounds are normal. There is no distension.     Palpations: Abdomen is soft.     Tenderness: There is no abdominal tenderness. There is no guarding or rebound.  Musculoskeletal:     Right lower leg: She exhibits no tenderness. No edema.     Left lower leg: She exhibits no tenderness. No edema.  Skin:    General: Skin is warm and dry.     Findings: No erythema.  Neurological:     Mental Status: She is alert.  Psychiatric:        Behavior: Behavior normal.      ED Treatments / Results  Labs (all labs ordered are listed, but only abnormal results are displayed) Labs Reviewed  BASIC METABOLIC PANEL - Abnormal; Notable for the following components:      Result Value   CO2 21 (*)    All other components within normal limits  CBC WITH DIFFERENTIAL/PLATELET  I-STAT BETA HCG BLOOD, ED ([facility], [facility], [facility] ONLY)    EKG EKG Interpretation  Date/Time:  Friday July 14 2018 15:50:35 EDT Ventricular Rate:  82 PR Interval:    QRS Duration: 91 QT Interval:  392 QTC Calculation: 458 R Axis:   70 Text Interpretation:  Sinus rhythm Confirmed by Lacretia Leigh ([id number]) on 07/14/2018 5:42:35 PM   Radiology Ct  Angio Chest Pe W And/or Wo Contrast  Result Date: 07/14/2018 CLINICAL DATA:  Cough and dyspnea. PE suspected, high pretest prob EXAM: CT ANGIOGRAPHY CHEST WITH  CONTRAST TECHNIQUE: Multidetector CT imaging of the chest was performed using the standard protocol during bolus administration of intravenous contrast. Multiplanar CT image reconstructions and MIPs were obtained to evaluate the vascular anatomy. CONTRAST:  166mL OMNIPAQUE IOHEXOL 350 MG/ML SOLN COMPARISON:  Radiograph earlier this day. Chest CT 03/09/2016 FINDINGS: Cardiovascular: There are no filling defects within the pulmonary arteries to suggest pulmonary embolus. The thoracic aorta is normal in caliber. No evidence of aortic dissection. Heart is normal in size. No pericardial effusion. No coronary artery calcifications. Mediastinum/Nodes: No enlarged mediastinal or hilar lymph nodes. The esophagus is decompressed. No visualized thyroid nodule. Lungs/Pleura: Lungs are clear. No airspace consolidation. No pulmonary edema or pleural fluid. No pulmonary mass. Trachea and mainstem bronchi are patent. Upper Abdomen: No acute abnormality. Musculoskeletal: There are no acute or suspicious osseous abnormalities. Limbic vertebra versus remote superior corner fracture of T11, unchanged. Review of the MIP images confirms the above findings. IMPRESSION: No pulmonary embolus or acute intrathoracic abnormality. Electronically Signed   By: Keith Rake M.D.   On: 07/14/2018 20:05   Dg Chest Port 1 View  Result Date: 07/14/2018 CLINICAL DATA:  Cough and shortness of breath with fever since Sunday. EXAM: PORTABLE CHEST 1 VIEW COMPARISON:  A CT chest dated March 09, 2016. Chest x-ray dated Sep 04, 2010. FINDINGS: The heart size and mediastinal contours are within normal limits. Both lungs are clear. The visualized skeletal structures are unremarkable. IMPRESSION: No active disease. Electronically Signed   By: Titus Dubin M.D.   On: 07/14/2018 16:10    Procedures Procedures (including critical care time)  Medications Ordered in ED Medications  sodium chloride (PF) 0.9 % injection (has no administration in  time range)  predniSONE (DELTASONE) tablet 60 mg (has no administration in time range)  sodium chloride 0.9 % bolus 1,000 mL (1,000 mLs Intravenous New Bag/Given 07/14/18 1624)  sodium chloride 0.9 % bolus 1,000 mL (1,000 mLs Intravenous New Bag/Given 07/14/18 1842)  iohexol (OMNIPAQUE) 350 MG/ML injection 100 mL (100 mLs Intravenous Contrast Given 07/14/18 1925)     Initial Impression / Assessment and Plan / ED Course  I have reviewed the triage vital signs and the nursing notes.  Pertinent labs & imaging results that were available during my care of the patient were reviewed by me and considered in my medical decision making (see chart for details).        Patient presenting for evaluation of shortness of breath, cough, fevers since Sunday.  Afebrile in the ED, vital signs otherwise stable.  Nontoxic in appearance.  Lung sounds clear to auscultation bilaterally.  EKG shows normal sinus rhythm.  Chest pain reproducible on palpation, does not sound cardiac in etiology and I doubt ACS/MI.  Chest x-ray without evidence of acute cardiopulmonary abnormalities.  Patient was ambulated with stable SPO2 saturations but became tachypneic and tachycardic.  CTA of the chest shows no evidence of PE, pneumonia, pleural effusions, or other cardiopulmonary abnormalities.  Lab work reviewed only shows no leukocytosis, no anemia, no metabolic derangements.  On reevaluation she is resting comfortably but reports feeling tired.  Does not meet criteria for admission.  She tested negative for the flu at an outside facility a few days ago and is pending COVID-19 testing.  Recommend self quarantine until her results return and she has been asymptomatic for 3 days.  Will discharge with short course of prednisone.  She has albuterol at home.  Discuss symptomatic management.  Recommend follow with PCP if symptoms persist.  Discussed strict ED return precautions. Pt verbalized understanding of and agreement with plan and is safe  for discharge home at this time. Discussed with Dr. Ralene Bathe and Dr. Zenia Resides who agree with assessment and plan at this time.   TANNYA GONET was evaluated in Emergency Department on 07/14/2018 for the symptoms described in the history of present illness. She was evaluated in the context of the global COVID-19 pandemic, which necessitated consideration that the patient might be at risk for infection with the SARS-CoV-2 virus that causes COVID-19. Institutional protocols and algorithms that pertain to the evaluation of patients at risk for COVID-19 are in a state of rapid change based on information released by regulatory bodies including the CDC and federal and state organizations. These policies and algorithms were followed during the patient's care in the ED.   Final Clinical Impressions(s) / ED Diagnoses   Final diagnoses:  Influenza-like illness  Cough  SOB (shortness of breath)    ED Discharge Orders         Ordered    predniSONE (DELTASONE) 10 MG tablet  Daily with breakfast     07/14/18 2028           Debroah Baller 07/14/18 2033    Quintella Reichert, MD 07/16/18 [phone]

## 2018-07-14 NOTE — ED Notes (Signed)
PT DISCHARGED. INSTRUCTIONS AND PRESCRIPTION GIVEN. AAOX4. PT IN NO APPARENT DISTRESS WITH MILD PAIN. THE OPPORTUNITY TO ASK QUESTIONS WAS PROVIDED.

## 2018-07-14 NOTE — ED Triage Notes (Signed)
Patient has experienced cough, shortness of breath and cough since Sunday.

## 2018-07-14 NOTE — Progress Notes (Signed)
Virtual Visit via Video Note  I connected with Kim Pham on 07/14/18 at  2:00 PM EDT by a video enabled telemedicine application and verified that I am speaking with the correct person using two identifiers.   I discussed the limitations of evaluation and management by telemedicine and the availability of in person appointments. The patient expressed understanding and agreed to proceed.  History of Present Illness: Patient is a 38 yr old female who presents today with chief complaint of sob, dry cough and fever.  She reports tmax of 100.3. Symptoms began on 3/23.  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 She did go to the drive thru tent at [facility] and was swabbed for COVID-19 but her results are still pending.    She describes a discomfort in her lungs "like if you are running in the cold."  She has been using tylenol as needed for pain/fever.  Reports that she has been checking her pulse ox and that her resting sat is in the low 90's but that she drops down into the 80's with ambulation. Feels very SOB if she tries to move around.   Observations/Objective:  Gen: ill appearing white female. Resp: no visible increased WOB noted on video at rest Psych: Calm/pleasant demeanor. Neuro: A and O x 3, + facial symmetry, clear speech.   Assessment and Plan:  Cough/SOB- suspect Covid-19. My concern is her SOB and hypoxia. I advised pt to go the [facility] ED and I gave report to the charge nurse.  Pt verbalized understanding and agreed to proceed to the ED.    Follow Up Instructions:    I discussed the assessment and treatment plan with the patient. The patient was provided an opportunity to ask questions and all were answered. The patient agreed with the plan and demonstrated an understanding of the instructions.   The patient was advised to call back or seek an in-person evaluation if the symptoms worsen or if the condition fails to improve as  anticipated.  I provided 15 minutes of non-face-to-face time during this encounter.   Nance Pear, NP

## 2018-07-14 NOTE — Discharge Instructions (Addendum)
Take prednisone as prescribed beginning tomorrow.  You received the first dose in the emergency department today.  Do not take any ibuprofen, Advil, Aleve, or Motrin while taking this medication.  You may continue to take Tylenol every 6 hours as needed for fever or pain.  Use your albuterol inhaler 1 to 2 puffs every 4-6 hours as needed for shortness of breath.  Use over-the-counter medicines such as Delsym or Mucinex for cough.  You can also use Tessalon as needed for cough.  Drink plenty of water and get plenty of rest.  Continue home isolation until your COVID-19 test results and you are asymptomatic for 3 days (no fever or cough).   Turn to the emergency department immediately for any concerning signs or symptoms develop such as worsening shortness of breath, color changes around the lips or fingers, passing out, or chest pains.     Person Under Monitoring Name: Kim Pham  Location: [address] [place] [address]   Infection Prevention Recommendations for Individuals Confirmed to have, or Being Evaluated for, 2019 Novel Coronavirus (COVID-19) Infection Who Receive Care at Home  Individuals who are confirmed to have, or are being evaluated for, COVID-19 should follow the prevention steps below until a healthcare provider or local or state health department says they can return to normal activities.  Stay home except to get medical care You should restrict activities outside your home, except for getting medical care. Do not go to work, school, or public areas, and do not use public transportation or taxis.  Call ahead before visiting your doctor Before your medical appointment, call the healthcare provider and tell them that you have, or are being evaluated for, COVID-19 infection. This will help the healthcare providers office take steps to keep other people from getting infected. Ask your healthcare provider to call the local or state health department.  Monitor  your symptoms Seek prompt medical attention if your illness is worsening (e.g., difficulty breathing). Before going to your medical appointment, call the healthcare provider and tell them that you have, or are being evaluated for, COVID-19 infection. Ask your healthcare provider to call the local or state health department.  Wear a facemask You should wear a facemask that covers your nose and mouth when you are in the same room with other people and when you visit a healthcare provider. People who live with or visit you should also wear a facemask while they are in the same room with you.  Separate yourself from other people in your home As much as possible, you should stay in a different room from other people in your home. Also, you should use a separate bathroom, if available.  Avoid sharing household items You should not share dishes, drinking glasses, cups, eating utensils, towels, bedding, or other items with other people in your home. After using these items, you should wash them thoroughly with soap and water.  Cover your coughs and sneezes Cover your mouth and nose with a tissue when you cough or sneeze, or you can cough or sneeze into your sleeve. Throw used tissues in a lined trash can, and immediately wash your hands with soap and water for at least 20 seconds or use an alcohol-based hand rub.  Wash your [facility] your hands often and thoroughly with soap and water for at least 20 seconds. You can use an alcohol-based hand sanitizer if soap and water are not available and if your hands are not visibly dirty. Avoid touching  your eyes, nose, and mouth with unwashed hands.   Prevention Steps for Caregivers and Household Members of Individuals Confirmed to have, or Being Evaluated for, COVID-19 Infection Being Cared for in the Home  If you live with, or provide care at home for, a person confirmed to have, or being evaluated for, COVID-19 infection please follow these  guidelines to prevent infection:  Follow healthcare providers instructions Make sure that you understand and can help the patient follow any healthcare provider instructions for all care.  Provide for the patients basic needs You should help the patient with basic needs in the home and provide support for getting groceries, prescriptions, and other personal needs.  Monitor the patients symptoms If they are getting sicker, call his or her medical provider and tell them that the patient has, or is being evaluated for, COVID-19 infection. This will help the healthcare providers office take steps to keep other people from getting infected. Ask the healthcare provider to call the local or state health department.  Limit the number of people who have contact with the patient If possible, have only one caregiver for the patient. Other household members should stay in another home or place of residence. If this is not possible, they should stay in another room, or be separated from the patient as much as possible. Use a separate bathroom, if available. Restrict visitors who do not have an essential need to be in the home.  Keep older adults, very young children, and other sick people away from the patient Keep older adults, very young children, and those who have compromised immune systems or chronic health conditions away from the patient. This includes people with chronic heart, lung, or kidney conditions, diabetes, and cancer.  Ensure good ventilation Make sure that shared spaces in the home have good air flow, such as from an air conditioner or an opened window, weather permitting.  Wash your hands often Wash your hands often and thoroughly with soap and water for at least 20 seconds. You can use an alcohol based hand sanitizer if soap and water are not available and if your hands are not visibly dirty. Avoid touching your eyes, nose, and mouth with unwashed hands. Use disposable paper  towels to dry your hands. If not available, use dedicated cloth towels and replace them when they become wet.  Wear a facemask and gloves Wear a disposable facemask at all times in the room and gloves when you touch or have contact with the patients blood, body fluids, and/or secretions or excretions, such as sweat, saliva, sputum, nasal mucus, vomit, urine, or feces.  Ensure the mask fits over your nose and mouth tightly, and do not touch it during use. Throw out disposable facemasks and gloves after using them. Do not reuse. Wash your hands immediately after removing your facemask and gloves. If your personal clothing becomes contaminated, carefully remove clothing and launder. Wash your hands after handling contaminated clothing. Place all used disposable facemasks, gloves, and other waste in a lined container before disposing them with other household waste. Remove gloves and wash your hands immediately after handling these items.  Do not share dishes, glasses, or other household items with the patient Avoid sharing household items. You should not share dishes, drinking glasses, cups, eating utensils, towels, bedding, or other items with a patient who is confirmed to have, or being evaluated for, COVID-19 infection. After the person uses these items, you should wash them thoroughly with soap and water.  [place] thoroughly  Immediately remove and wash clothes or bedding that have blood, body fluids, and/or secretions or excretions, such as sweat, saliva, sputum, nasal mucus, vomit, urine, or feces, on them. Wear gloves when handling laundry from the patient. Read and follow directions on labels of laundry or clothing items and detergent. In general, wash and dry with the warmest temperatures recommended on the label.  Clean all areas the individual has used often Clean all touchable surfaces, such as counters, tabletops, doorknobs, bathroom fixtures, toilets, phones, keyboards, tablets,  and bedside tables, every day. Also, clean any surfaces that may have blood, body fluids, and/or secretions or excretions on them. Wear gloves when cleaning surfaces the patient has come in contact with. Use a diluted bleach solution (e.g., dilute bleach with 1 part bleach and 10 parts water) or a household disinfectant with a label that says EPA-registered for coronaviruses. To make a bleach solution at home, add 1 tablespoon of bleach to 1 quart (4 cups) of water. For a larger supply, add  cup of bleach to 1 gallon (16 cups) of water. Read labels of cleaning products and follow recommendations provided on product labels. Labels contain instructions for safe and effective use of the cleaning product including precautions you should take when applying the product, such as wearing gloves or eye protection and making sure you have good ventilation during use of the product. Remove gloves and wash hands immediately after cleaning.  Monitor yourself for signs and symptoms of illness Caregivers and household members are considered close contacts, should monitor their health, and will be asked to limit movement outside of the home to the extent possible. Follow the monitoring steps for close contacts listed on the symptom monitoring form.   ? If you have additional questions, contact your local health department or call the epidemiologist on call at [phone] (available 24/7). ? This guidance is subject to change. For the most up-to-date guidance from [facility], please refer to their website: [email or web address]

## 2018-07-18 ENCOUNTER — Other Ambulatory Visit: Payer: Self-pay | Admitting: Neurology

## 2018-07-18 NOTE — Telephone Encounter (Signed)
Spoke to pt and she is taking qudexy 200mg  po daily.  Refilled 90 day supply.  Need f/u (tele-video).  She was currently waiting back on covid19 results.

## 2018-07-21 ENCOUNTER — Encounter: Payer: Self-pay | Admitting: Family

## 2018-07-21 MED ORDER — AZITHROMYCIN 250 MG PO TABS: ORAL_TABLET | ORAL | 0 refills | Status: DC

## 2018-07-21 NOTE — Telephone Encounter (Signed)
Pt reports that she is less sob now then last week.  Reports that she still can't walk up and down the stairs or long distances.  Still waking up in the middle of the night.  Reports intermittent fevers 102.3 last night.  Reports that she has also had headaches which "are different from my migraines."    Will rx with azithromycin. She is to continue albuterol prn, tessalon prn. She is advised to return to the ER if she develops recurrent fever >101 on azithromycin of if not improved in 2-3 days.  Pt verbalizes understanding.

## 2018-07-28 ENCOUNTER — Encounter: Payer: Self-pay | Admitting: Family

## 2018-08-16 ENCOUNTER — Other Ambulatory Visit: Payer: Self-pay | Admitting: Family

## 2018-10-22 ENCOUNTER — Encounter: Payer: Self-pay | Admitting: Family

## 2019-01-30 DIAGNOSIS — T7840XA Allergy, unspecified, initial encounter: Secondary | ICD-10-CM | POA: Diagnosis not present

## 2019-01-30 DIAGNOSIS — G43909 Migraine, unspecified, not intractable, without status migrainosus: Secondary | ICD-10-CM | POA: Diagnosis not present

## 2019-01-30 DIAGNOSIS — R110 Nausea: Secondary | ICD-10-CM | POA: Diagnosis not present

## 2019-01-30 DIAGNOSIS — Z6829 Body mass index (BMI) 29.0-29.9, adult: Secondary | ICD-10-CM | POA: Diagnosis not present

## 2019-02-13 DIAGNOSIS — Z0000 Encounter for general adult medical examination without abnormal findings: Secondary | ICD-10-CM | POA: Diagnosis not present

## 2019-02-13 DIAGNOSIS — Z13220 Encounter for screening for lipoid disorders: Secondary | ICD-10-CM | POA: Diagnosis not present

## 2019-02-13 DIAGNOSIS — F419 Anxiety disorder, unspecified: Secondary | ICD-10-CM | POA: Diagnosis not present

## 2019-02-13 DIAGNOSIS — G43909 Migraine, unspecified, not intractable, without status migrainosus: Secondary | ICD-10-CM | POA: Diagnosis not present

## 2019-02-13 DIAGNOSIS — F988 Other specified behavioral and emotional disorders with onset usually occurring in childhood and adolescence: Secondary | ICD-10-CM | POA: Diagnosis not present

## 2019-03-09 ENCOUNTER — Encounter: Payer: Self-pay | Admitting: Family

## 2019-03-10 DIAGNOSIS — J0140 Acute pansinusitis, unspecified: Secondary | ICD-10-CM | POA: Diagnosis not present

## 2019-03-11 ENCOUNTER — Encounter: Payer: Self-pay | Admitting: Family

## 2019-03-12 ENCOUNTER — Other Ambulatory Visit: Payer: Self-pay

## 2019-03-12 ENCOUNTER — Telehealth (INDEPENDENT_AMBULATORY_CARE_PROVIDER_SITE_OTHER): Payer: Self-pay | Admitting: Family

## 2019-03-12 DIAGNOSIS — U071 COVID-19: Secondary | ICD-10-CM

## 2019-03-12 MED ORDER — BENZONATATE 100 MG PO CAPS: 100.0000 mg | ORAL_CAPSULE | Freq: Three times a day (TID) | ORAL | 0 refills | Status: DC | PRN

## 2019-03-12 NOTE — Telephone Encounter (Signed)
Kim Pham -- Looks like pt and spouse have virtual visits with you today. Routing in case anything in message needs to be addressed that is not addressed at Virtual visits.

## 2019-03-12 NOTE — Progress Notes (Signed)
Virtual Visit via Video Note  I connected with Kim Pham on 03/12/19 at  7:20 AM EST by a video enabled telemedicine application and verified that I am speaking with the correct person using two identifiers.  Location: Patient: home Provider: home   I discussed the limitations of evaluation and management by telemedicine and the availability of in person appointments. The patient expressed understanding and agreed to proceed.  History of Present Illness:  Patient is a 38 year old female who presents today to discuss COVID-19 infection.Reports that she swabbed an employee last week who was ill. They share an office.  Employee tested positive for COVID-19.  Patient developed a HA on Wednesday 11/18 along with myalgias and congestion.  She took covid test on Friday and then she was called yesterday with + covid test results.  Today she reports+ cough, fatigue, congestion, temp 100.3.  She reports that she had to use her inhaler last night due to some mild shortness of breath.  Symptoms resolved after use of albuterol.  She denies loss of taste or smell.  + sore throat.  Her husband is starting to exhibit symptoms.  Her teenage son appears well.  Past Medical History:  Diagnosis Date  . Anxiety   . Arthritis   . Chiari malformation   . Chronic headaches   . Endometriosis   . GERD (gastroesophageal reflux disease)    tums otc, prevacid prn  . Headache   . Low blood pressure    pt states history of low blood pressure-fainted 12/2010-instructed to move slowly  . SLE (systemic lupus erythematosus) ([place]) 03/26/2016     Social History   Socioeconomic History  . Marital status: Married    Spouse name: Not on file  . Number of children: 1  . Years of education: Assoc  . Highest education level: Not on file  Occupational History  . Occupation: nurse  Social Needs  . Financial resource strain: Not on file  . Food insecurity    Worry: Not on file    Inability: Not on file  .  Transportation needs    Medical: Not on file    Non-medical: Not on file  Tobacco Use  . Smoking status: Former Smoker    Packs/day: 1.00    Years: 10.00    Pack years: 10.00    Types: Cigarettes    Quit date: 04/04/2004    Years since quitting: 14.9  . Smokeless tobacco: Never Used  Substance and Sexual Activity  . Alcohol use: No  . Drug use: No  . Sexual activity: Not on file  Lifestyle  . Physical activity    Days per week: Not on file    Minutes per session: Not on file  . Stress: Not on file  Relationships  . Social Herbalist on phone: Not on file    Gets together: Not on file    Attends religious service: Not on file    Active member of club or organization: Not on file    Attends meetings of clubs or organizations: Not on file    Relationship status: Not on file  . Intimate partner violence    Fear of current or ex partner: Not on file    Emotionally abused: Not on file    Physically abused: Not on file    Forced sexual activity: Not on file  Other Topics Concern  . Not on file  Social History Narrative   Works as an Corporate treasurer @ [employer]   Married   1 son- born 2004 has autism   Enjoys exercise, special olympics with son   Drinks 4-5 cups of coffee a day     Past Surgical History:  Procedure Laterality Date  . ABDOMINAL HYSTERECTOMY  2007  . CYSTOSCOPY  2007  . DIAGNOSTIC LAPAROSCOPY  2005  . ROTATOR CUFF REPAIR  2010   right shoulder  . SALPINGOOPHORECTOMY  04/16/2011   Procedure: SALPINGO OOPHERECTOMY;  Surgeon: Lovenia Kim, MD;  Location: [facility];  Service: Gynecology;  Laterality: Right;  . skull decompression surgery 2005      Family History  Problem Relation Age of Onset  . Hypertension Mother   . CAD Mother   . Breast cancer Mother   . Migraines Mother   . Gout Mother   . Heart disease Mother   . Crohn's disease Mother   . Hypertension Father   . CAD Father   . CVA Father   . Heart disease Father   . Sjogren's  syndrome Sister   . Celiac disease Sister   . Rheum arthritis Maternal Grandmother   . Autism Son   . ADD / ADHD Son   . Pancreatic cancer Maternal Grandfather     Allergies  Allergen Reactions  . Effexor [Venlafaxine] Nausea Only    insomnia    Current Outpatient Medications on File Prior to Visit  Medication Sig Dispense Refill  . albuterol (PROVENTIL HFA;VENTOLIN HFA) 108 (90 Base) MCG/ACT inhaler Inhale 2 puffs into the lungs every 4 (four) hours as needed for wheezing or shortness of breath. 1 Inhaler 0  . azithromycin (ZITHROMAX) 250 MG tablet Take 2 tabs by mouth today, then one tab once daily for 4 more days. 6 tablet 0  . FLUoxetine (PROZAC) 20 MG tablet Take 1 tablet (20 mg total) by mouth daily. (Patient not taking: Reported on 07/14/2018) 30 tablet 3  . hydroxychloroquine (PLAQUENIL) 200 MG tablet TAKE ONE TABLET TWO TIMES A DAY MONDAY THROUGH FRIDAY. (Patient taking differently: Take 200 mg by mouth as directed. TAKE 200mg  TWO TIMES A DAY MONDAY THROUGH FRIDAY.) 40 tablet 0  . hydrOXYzine (ATARAX/VISTARIL) 25 MG tablet TAKE ONE TABLET THREE TIMES A DAY AS NEEDED FOR ANXIETY 30 tablet 2  . ibuprofen (ADVIL,MOTRIN) 200 MG tablet Take 600 mg by mouth every 6 (six) hours as needed for headache or mild pain.    Marland Kitchen ketorolac (TORADOL) 10 MG tablet Take 1 tablet (10 mg total) by mouth every 6 (six) hours as needed. Take 1 tablet at onset of headache, may take 3 times daily if needed. Do not take for more than 3 days in a row 30 tablet 3  . Multiple Vitamins-Minerals (MULTIVITAMIN WITH MINERALS) tablet Take by mouth daily.    . ondansetron (ZOFRAN) 4 MG tablet Take 1 tablet (4 mg total) by mouth every 8 (eight) hours as needed for nausea or vomiting. 20 tablet 0  . pantoprazole (PROTONIX) 40 MG tablet Take 1 tablet (40 mg total) by mouth daily. (Patient not taking: Reported on 07/14/2018) 30 tablet 3  . SUMAtriptan (IMITREX) 100 MG tablet Take 1 tablet (100 mg total) by mouth at onset of  migraine. May repeat in 2 hours if headache persists or recurs. Do not take more than 2 doses in 24 hours. 10 tablet 2  . topiramate ER (QUDEXY XR) 200 MG CS24 sprinkle capsule TAKE ONE CAPSULE BY MOUTH AT BEDTIME 90 each 0  . topiramate ER (QUDEXY XR) CS24 sprinkle capsule  Take 2 capsules (200 mg total) by mouth at bedtime. 30 each 0  . Vitamin D, Ergocalciferol, (DRISDOL) 50000 units CAPS capsule Take 1 capsule (50,000 Units total) by mouth every 7 (seven) days. (Patient not taking: Reported on 07/14/2018) 12 capsule 0   Current Facility-Administered Medications on File Prior to Visit  Medication Dose Route Frequency Provider Last Rate Last Dose  . 0.9 %  sodium chloride infusion  500 mL Intravenous Once Jackquline Denmark, MD      . gadopentetate dimeglumine (MAGNEVIST) injection 15 mL  15 mL Intravenous Once PRN Melvenia Beam, MD        There were no vitals taken for this visit.   Observations/Objective:   Gen: Awake, alert, no acute distress Resp: Breathing is even and non-labored Psych: calm/pleasant demeanor Neuro: Alert and Oriented x 3, + facial symmetry, speech is clear.   Assessment and Plan:  COVID-19 infection-  We discussed supportive measures including rest, hydration, Tylenol as needed for fever or body aches.  She is advised on red flags that should prompt ED visit including shortness of breath or hypoxia.  She is advised to to monitor her oxygen saturation at home.  15 minutes spent on today's video visit.   Patient was advised to quarantine as follows following positive COVID-19 result:  At least 10 days have passed since symptom onset and At least 24 hours have passed since resolution of fever without the use of fever-reducing medications and Other symptoms have improved.   Follow Up Instructions:    I discussed the assessment and treatment plan with the patient. The patient was provided an opportunity to ask questions and all were answered. The patient agreed  with the plan and demonstrated an understanding of the instructions.   The patient was advised to call back or seek an in-person evaluation if the symptoms worsen or if the condition fails to improve as anticipated.  Nance Pear, NP

## 2019-03-12 NOTE — Telephone Encounter (Signed)
Noted. See office notes.

## 2019-03-13 DIAGNOSIS — G4700 Insomnia, unspecified: Secondary | ICD-10-CM | POA: Diagnosis not present

## 2019-03-13 DIAGNOSIS — F988 Other specified behavioral and emotional disorders with onset usually occurring in childhood and adolescence: Secondary | ICD-10-CM | POA: Diagnosis not present

## 2019-03-13 DIAGNOSIS — U071 COVID-19: Secondary | ICD-10-CM | POA: Diagnosis not present

## 2019-03-13 DIAGNOSIS — F419 Anxiety disorder, unspecified: Secondary | ICD-10-CM | POA: Diagnosis not present

## 2019-03-19 ENCOUNTER — Encounter: Payer: Self-pay | Admitting: Family

## 2019-03-25 ENCOUNTER — Encounter: Payer: Self-pay | Admitting: Family

## 2019-03-26 MED ORDER — PREDNISONE 10 MG PO TABS: ORAL_TABLET | ORAL | 0 refills | Status: DC

## 2019-05-11 DIAGNOSIS — F419 Anxiety disorder, unspecified: Secondary | ICD-10-CM | POA: Diagnosis not present

## 2019-05-11 DIAGNOSIS — G4700 Insomnia, unspecified: Secondary | ICD-10-CM | POA: Diagnosis not present

## 2019-05-11 DIAGNOSIS — R12 Heartburn: Secondary | ICD-10-CM | POA: Diagnosis not present

## 2019-05-11 DIAGNOSIS — F988 Other specified behavioral and emotional disorders with onset usually occurring in childhood and adolescence: Secondary | ICD-10-CM | POA: Diagnosis not present

## 2019-09-04 DIAGNOSIS — M5136 Other intervertebral disc degeneration, lumbar region: Secondary | ICD-10-CM | POA: Insufficient documentation

## 2019-09-04 DIAGNOSIS — M51369 Other intervertebral disc degeneration, lumbar region without mention of lumbar back pain or lower extremity pain: Secondary | ICD-10-CM

## 2019-09-04 HISTORY — DX: Other intervertebral disc degeneration, lumbar region without mention of lumbar back pain or lower extremity pain: M51.369

## 2019-09-04 HISTORY — DX: Other intervertebral disc degeneration, lumbar region: M51.36

## 2019-10-23 DIAGNOSIS — M5416 Radiculopathy, lumbar region: Secondary | ICD-10-CM | POA: Diagnosis not present

## 2019-11-05 DIAGNOSIS — F419 Anxiety disorder, unspecified: Secondary | ICD-10-CM | POA: Diagnosis not present

## 2019-11-05 DIAGNOSIS — G4700 Insomnia, unspecified: Secondary | ICD-10-CM | POA: Diagnosis not present

## 2019-11-05 DIAGNOSIS — F988 Other specified behavioral and emotional disorders with onset usually occurring in childhood and adolescence: Secondary | ICD-10-CM | POA: Diagnosis not present

## 2019-11-05 DIAGNOSIS — G40909 Epilepsy, unspecified, not intractable, without status epilepticus: Secondary | ICD-10-CM | POA: Diagnosis not present

## 2019-11-11 DIAGNOSIS — Q7649 Other congenital malformations of spine, not associated with scoliosis: Secondary | ICD-10-CM | POA: Diagnosis not present

## 2019-11-11 DIAGNOSIS — M5416 Radiculopathy, lumbar region: Secondary | ICD-10-CM | POA: Diagnosis not present

## 2019-11-13 DIAGNOSIS — M533 Sacrococcygeal disorders, not elsewhere classified: Secondary | ICD-10-CM | POA: Diagnosis not present

## 2019-11-13 DIAGNOSIS — G5751 Tarsal tunnel syndrome, right lower limb: Secondary | ICD-10-CM | POA: Diagnosis not present

## 2019-11-13 DIAGNOSIS — M5416 Radiculopathy, lumbar region: Secondary | ICD-10-CM | POA: Diagnosis not present

## 2019-11-30 DIAGNOSIS — W540XXA Bitten by dog, initial encounter: Secondary | ICD-10-CM | POA: Diagnosis not present

## 2019-11-30 DIAGNOSIS — S91359A Open bite, unspecified foot, initial encounter: Secondary | ICD-10-CM | POA: Diagnosis not present

## 2019-12-19 DIAGNOSIS — Z791 Long term (current) use of non-steroidal anti-inflammatories (NSAID): Secondary | ICD-10-CM | POA: Diagnosis not present

## 2019-12-19 DIAGNOSIS — M461 Sacroiliitis, not elsewhere classified: Secondary | ICD-10-CM | POA: Diagnosis not present

## 2019-12-19 DIAGNOSIS — Z79899 Other long term (current) drug therapy: Secondary | ICD-10-CM | POA: Diagnosis not present

## 2019-12-19 DIAGNOSIS — M533 Sacrococcygeal disorders, not elsewhere classified: Secondary | ICD-10-CM

## 2019-12-19 HISTORY — DX: Sacrococcygeal disorders, not elsewhere classified: M53.3

## 2020-02-25 ENCOUNTER — Other Ambulatory Visit: Payer: Self-pay

## 2020-02-25 ENCOUNTER — Encounter: Payer: Self-pay | Admitting: Family

## 2020-02-25 ENCOUNTER — Telehealth: Payer: Self-pay | Admitting: Family

## 2020-02-25 ENCOUNTER — Ambulatory Visit (INDEPENDENT_AMBULATORY_CARE_PROVIDER_SITE_OTHER): Payer: Medicaid Other | Admitting: Family

## 2020-02-25 VITALS — BP 115/67 | HR 80 | Temp 98.5°F | Resp 16 | Ht 69.0 in | Wt 225.0 lb

## 2020-02-25 DIAGNOSIS — K219 Gastro-esophageal reflux disease without esophagitis: Secondary | ICD-10-CM

## 2020-02-25 DIAGNOSIS — R635 Abnormal weight gain: Secondary | ICD-10-CM

## 2020-02-25 DIAGNOSIS — F1010 Alcohol abuse, uncomplicated: Secondary | ICD-10-CM

## 2020-02-25 DIAGNOSIS — E559 Vitamin D deficiency, unspecified: Secondary | ICD-10-CM

## 2020-02-25 DIAGNOSIS — R253 Fasciculation: Secondary | ICD-10-CM

## 2020-02-25 LAB — BASIC METABOLIC PANEL
BUN: 10 mg/dL (ref 6–23)
CO2: 26 mEq/L (ref 19–32)
Calcium: 9 mg/dL (ref 8.4–10.5)
Chloride: 100 mEq/L (ref 96–112)
Creatinine, Ser: 0.77 mg/dL (ref 0.40–1.20)
GFR: 96.87 mL/min (ref >60.00)
Glucose, Bld: 81 mg/dL (ref 70–99)
Potassium: 4.5 mEq/L (ref 3.5–5.1)
Sodium: 135 mEq/L (ref 135–145)

## 2020-02-25 LAB — TSH: TSH: 0.92 u[IU]/mL (ref 0.35–4.50)

## 2020-02-25 MED ORDER — FLUOXETINE HCL 10 MG PO CAPS: 30.0000 mg | ORAL_CAPSULE | Freq: Every day | ORAL | 1 refills | Status: DC

## 2020-02-25 MED ORDER — TRAZODONE HCL 100 MG PO TABS: 100.0000 mg | ORAL_TABLET | Freq: Every day | ORAL | Status: DC

## 2020-02-25 NOTE — Progress Notes (Addendum)
Subjective:    Patient ID: Kim Pham, female    DOB: 12/13/1980, 39 y.o.   MRN: [id number]  HPI  Patient is a 39 yr old female who presents today with c/o muscle spasms in her arms and legs. Reports that this began about 6 weeks ago.   Reports intermittent twitches.  Not feeling right. Feels like she has cloudy thinking.  Mild dizziness.  Drinking more recently.  Reports that she is feeling clumsy. Reports sugar and blood pressure has been fine.   Notes increased stress.  Drinking 3 shots of a hard liquor/day.  Has family hx of alcoholism. She used to be a 1 glass of wine a night drinker and this concerns her. Her husband has also told her that he is concerned about her increased alcohol consumption.  Reports that she is working at [employer] 7-3 and every other weekend. This is a lot less stressful.    Wt Readings from Last 3 Encounters:  02/25/20 225 lb (102.1 kg)  07/14/18 175 lb (79.4 kg)  01/27/18 167 lb (75.8 kg)   GERD- reports that she has had had some gerd symptoms.    Review of Systems See HPI  Past Medical History:  Diagnosis Date  . Anxiety   . Arthritis   . Chiari malformation   . Chronic headaches   . Endometriosis   . GERD (gastroesophageal reflux disease)    tums otc, prevacid prn  . Headache   . Low blood pressure    pt states history of low blood pressure-fainted 12/2010-instructed to move slowly  . SLE (systemic lupus erythematosus) ([place]) 03/26/2016     Social History   Socioeconomic History  . Marital status: Married    Spouse name: Not on file  . Number of children: 1  . Years of education: Assoc  . Highest education level: Not on file  Occupational History  . Occupation: nurse  Tobacco Use  . Smoking status: Former Smoker    Packs/day: 1.00    Years: 10.00    Pack years: 10.00    Types: Cigarettes    Quit date: 04/04/2004    Years since quitting: 15.9  . Smokeless tobacco: Never Used  Vaping Use  . Vaping Use:  Never used  Substance and Sexual Activity  . Alcohol use: No  . Drug use: No  . Sexual activity: Not on file  Other Topics Concern  . Not on file  Social History Narrative   Works as an LPN @ [employer]   Married   1 son- born 2004 has autism   Enjoys exercise, special olympics with son   Drinks 4-5 cups of coffee a day    Social Determinants of Health   Financial Resource Strain:   . Difficulty of Paying Living Expenses: Not on file  Food Insecurity:   . Worried About Charity fundraiser in the Last Year: Not on file  . Ran Out of Food in the Last Year: Not on file  Transportation Needs:   . Lack of Transportation (Medical): Not on file  . Lack of Transportation (Non-Medical): Not on file  Physical Activity:   . Days of Exercise per Week: Not on file  . Minutes of Exercise per Session: Not on file  Stress:   . Feeling of Stress : Not on file  Social Connections:   . Frequency of Communication with Friends and Family: Not on file  . Frequency of Social Gatherings with Friends and Family:  Not on file  . Attends Religious Services: Not on file  . Active Member of Clubs or Organizations: Not on file  . Attends Archivist Meetings: Not on file  . Marital Status: Not on file  Intimate Partner Violence:   . Fear of Current or Ex-Partner: Not on file  . Emotionally Abused: Not on file  . Physically Abused: Not on file  . Sexually Abused: Not on file    Past Surgical History:  Procedure Laterality Date  . ABDOMINAL HYSTERECTOMY  2007  . CYSTOSCOPY  2007  . DIAGNOSTIC LAPAROSCOPY  2005  . ROTATOR CUFF REPAIR  2010   right shoulder  . SALPINGOOPHORECTOMY  04/16/2011   Procedure: SALPINGO OOPHERECTOMY;  Surgeon: Lovenia Kim, MD;  Location: [facility];  Service: Gynecology;  Laterality: Right;  . skull decompression surgery 2005      Family History  Problem Relation Age of Onset  . Hypertension Mother   . CAD Mother   . Breast cancer Mother   .  Migraines Mother   . Gout Mother   . Heart disease Mother   . Crohn's disease Mother   . Hypertension Father   . CAD Father   . CVA Father   . Heart disease Father   . Sjogren's syndrome Sister   . Celiac disease Sister   . Rheum arthritis Maternal Grandmother   . Autism Son   . ADD / ADHD Son   . Pancreatic cancer Maternal Grandfather     Allergies  Allergen Reactions  . Effexor [Venlafaxine] Nausea Only    insomnia    Current Outpatient Medications on File Prior to Visit  Medication Sig Dispense Refill  . FLUoxetine (PROZAC) 20 MG tablet Take 1 tablet (20 mg total) by mouth daily. 30 tablet 3  . hydroxychloroquine (PLAQUENIL) 200 MG tablet TAKE ONE TABLET TWO TIMES A DAY MONDAY THROUGH FRIDAY. (Patient taking differently: Take 200 mg by mouth as directed. TAKE 200mg  TWO TIMES A DAY MONDAY THROUGH FRIDAY.) 40 tablet 0  . hydrOXYzine (ATARAX/VISTARIL) 25 MG tablet TAKE ONE TABLET THREE TIMES A DAY AS NEEDED FOR ANXIETY 30 tablet 2  . ibuprofen (ADVIL,MOTRIN) 200 MG tablet Take 600 mg by mouth every 6 (six) hours as needed for headache or mild pain.    Marland Kitchen ketorolac (TORADOL) 10 MG tablet Take 1 tablet (10 mg total) by mouth every 6 (six) hours as needed. Take 1 tablet at onset of headache, may take 3 times daily if needed. Do not take for more than 3 days in a row 30 tablet 3  . Multiple Vitamins-Minerals (MULTIVITAMIN WITH MINERALS) tablet Take by mouth daily.    . ondansetron (ZOFRAN) 4 MG tablet Take 1 tablet (4 mg total) by mouth every 8 (eight) hours as needed for nausea or vomiting. 20 tablet 0  . pantoprazole (PROTONIX) 40 MG tablet Take 1 tablet (40 mg total) by mouth daily. 30 tablet 3  . SUMAtriptan (IMITREX) 100 MG tablet Take 1 tablet (100 mg total) by mouth at onset of migraine. May repeat in 2 hours if headache persists or recurs. Do not take more than 2 doses in 24 hours. 10 tablet 2  . topiramate ER (QUDEXY XR) 200 MG CS24 sprinkle capsule TAKE ONE CAPSULE BY MOUTH AT  BEDTIME 90 each 0  . topiramate ER (QUDEXY XR) CS24 sprinkle capsule Take 2 capsules (200 mg total) by mouth at bedtime. 30 each 0  . Vitamin D, Ergocalciferol, (DRISDOL) 50000 units CAPS capsule Take  1 capsule (50,000 Units total) by mouth every 7 (seven) days. 12 capsule 0   Current Facility-Administered Medications on File Prior to Visit  Medication Dose Route Frequency Provider Last Rate Last Admin  . 0.9 %  sodium chloride infusion  500 mL Intravenous Once Jackquline Denmark, MD      . gadopentetate dimeglumine (MAGNEVIST) injection 15 mL  15 mL Intravenous Once PRN Melvenia Beam, MD        BP 115/67 (BP Location: Right Arm, Patient Position: Sitting, Cuff Size: Large)   Pulse 80   Temp 98.5 F (36.9 C) (Oral)   Resp 16   Ht 5\' 9"  (1.753 m)   Wt 225 lb (102.1 kg)   SpO2 98%   BMI 33.23 kg/m       Objective:   Physical Exam Constitutional:      Appearance: She is well-developed.  HENT:     Right Ear: Tympanic membrane and ear canal normal.     Left Ear: Tympanic membrane and ear canal normal.  Neck:     Thyroid: No thyromegaly.  Cardiovascular:     Rate and Rhythm: Normal rate and regular rhythm.     Heart sounds: Normal heart sounds. No murmur heard.   Pulmonary:     Effort: Pulmonary effort is normal. No respiratory distress.     Breath sounds: Normal breath sounds. No wheezing.  Musculoskeletal:     Cervical back: Neck supple.  Skin:    General: Skin is warm and dry.  Neurological:     Mental Status: She is alert and oriented to person, place, and time.     Cranial Nerves: Facial asymmetry present. No dysarthria.     Motor: Motor function is intact.     Deep Tendon Reflexes:     Reflex Scores:      Patellar reflexes are 2+ on the right side and 2+ on the left side.    Comments: EOM intact PERRLA Bilateral UE/LE strength is 5/5 No tremor noted   Psychiatric:        Behavior: Behavior normal.        Thought Content: Thought content normal.        Judgment:  Judgment normal.           Assessment & Plan:  Alcohol abuse- recommended that she try cutting down to 2 drinks/night then 1 drink per night, then stop. Recommended that she then remove the alcohol from her home. We discussed resources to help her such as AA and [employer] in [place].  Depression- this is not optimally controlled. Will increase prozac from 10mg  to 30mg  once daily.  Suggested that she also establish with a counselor who has interest in ETOH abuse treatment which she is interested in doing.  Weight gain- she has gained 50 pounds since the start of the pandemic. We discussed that her alcohol use is likely a large contributing factor in her weight gain and quitting drinking should also help her weight.  GERD- some gerd symptoms despite protonix. Likely exacerbated by recent weight gain.  Muscle twitching- see mychart message. Advised d/c of trazodone as this could be a side effect.  May use melatonin as needed for sleep.   Vit d deficiency- check follow up vit D level- she is not on vit D supplement at this time.   This visit occurred during the SARS-CoV-2 public health emergency.  Safety protocols were in place, including screening questions prior to the visit, additional usage of staff  PPE, and extensive cleaning of exam room while observing appropriate contact time as indicated for disinfecting solutions.   Addendum: reviewed above note- "facial asymmetry" notation was an error.  Pt had + facial symmetry on exam that day.

## 2020-02-25 NOTE — Patient Instructions (Signed)
Please increase prozac from 20mg  to 30mg  once daily. Complete lab work prior to leaving.

## 2020-02-25 NOTE — Telephone Encounter (Signed)
See mychart.  

## 2020-02-28 ENCOUNTER — Telehealth (INDEPENDENT_AMBULATORY_CARE_PROVIDER_SITE_OTHER): Payer: Medicaid Other | Admitting: Family Medicine

## 2020-02-28 DIAGNOSIS — J111 Influenza due to unidentified influenza virus with other respiratory manifestations: Secondary | ICD-10-CM

## 2020-02-28 DIAGNOSIS — R059 Cough, unspecified: Secondary | ICD-10-CM

## 2020-02-28 LAB — VITAMIN D 1,25 DIHYDROXY
Vitamin D 1, 25 (OH)2 Total: 49 pg/mL (ref 18–72)
Vitamin D2 1, 25 (OH)2: <8 pg/mL
Vitamin D3 1, 25 (OH)2: 49 pg/mL

## 2020-02-28 MED ORDER — ONDANSETRON 4 MG PO TBDP: 4.0000 mg | ORAL_TABLET | Freq: Three times a day (TID) | ORAL | 0 refills | Status: DC | PRN

## 2020-02-28 NOTE — Progress Notes (Signed)
Virtual Visit via Video Note  I connected with Kim Pham  on 02/28/20 at  5:00 PM EST by a video enabled telemedicine application and verified that I am speaking with the correct person using two identifiers.  Location patient: home, [place] Location provider:work or home office Persons participating in the virtual visit: patient, provider  I discussed the limitations of evaluation and management by telemedicine and the availability of in person appointments. The patient expressed understanding and agreed to proceed.   HPI:  Acute telemedicine visit for sinus issues: -Onset: 3-4 days ago -Symptoms include: nausea, nasal congestion, HA, fevers, chills, feeling tire, body aches, sore throat, diarrhea -son was diagnosed with influenza a few days prior (son was negative for covid) -fever 101.2 today -Denies: NV, SOB, CP, inability to get out of bed, eat, drink - but the nausea is making it tough to want to eat -Has tried: nothing -Pertinent past medical history:SLE on plaquenil, obesity -Pertinent medication allergies: effexor -COVID-19 vaccine status: has had her covid vaccines and booster; did not have flu vaccine  ROS: See pertinent positives and negatives per HPI.  Past Medical History:  Diagnosis Date  . Anxiety   . Arthritis   . Chiari malformation   . Chronic headaches   . Endometriosis   . GERD (gastroesophageal reflux disease)    tums otc, prevacid prn  . Headache   . Low blood pressure    pt states history of low blood pressure-fainted 12/2010-instructed to move slowly  . SLE (systemic lupus erythematosus) ([place]) 03/26/2016    Past Surgical History:  Procedure Laterality Date  . ABDOMINAL HYSTERECTOMY  2007  . CYSTOSCOPY  2007  . DIAGNOSTIC LAPAROSCOPY  2005  . ROTATOR CUFF REPAIR  2010   right shoulder  . SALPINGOOPHORECTOMY  04/16/2011   Procedure: SALPINGO OOPHERECTOMY;  Surgeon: Lovenia Donta Mcinroy, MD;  Location: [facility];  Service: Gynecology;  Laterality: Right;  .  skull decompression surgery 2005       Current Outpatient Medications:  .  FLUoxetine (PROZAC) 10 MG capsule, Take 3 capsules (30 mg total) by mouth daily., Disp: 270 capsule, Rfl: 1 .  hydroxychloroquine (PLAQUENIL) 200 MG tablet, TAKE ONE TABLET TWO TIMES A DAY MONDAY THROUGH FRIDAY. (Patient taking differently: Take 200 mg by mouth as directed. TAKE 200mg  TWO TIMES A DAY MONDAY THROUGH FRIDAY.), Disp: 40 tablet, Rfl: 0 .  ibuprofen (ADVIL,MOTRIN) 200 MG tablet, Take 600 mg by mouth every 6 (six) hours as needed for headache or mild pain., Disp: , Rfl:  .  Multiple Vitamins-Minerals (MULTIVITAMIN WITH MINERALS) tablet, Take by mouth daily., Disp: , Rfl:  .  ondansetron (ZOFRAN ODT) 4 MG disintegrating tablet, Take 1 tablet (4 mg total) by mouth every 8 (eight) hours as needed for nausea or vomiting., Disp: 10 tablet, Rfl: 0 .  pantoprazole (PROTONIX) 40 MG tablet, Take 1 tablet (40 mg total) by mouth daily., Disp: 30 tablet, Rfl: 3 .  SUMAtriptan (IMITREX) 100 MG tablet, Take 1 tablet (100 mg total) by mouth at onset of migraine. May repeat in 2 hours if headache persists or recurs. Do not take more than 2 doses in 24 hours., Disp: 10 tablet, Rfl: 2 .  traZODone (DESYREL) 100 MG tablet, Take 1 tablet (100 mg total) by mouth at bedtime., Disp: , Rfl:   Current Facility-Administered Medications:  .  0.9 %  sodium chloride infusion, 500 mL, Intravenous, Once, Jackquline Denmark, MD  EXAM:  VITALS per patient if applicable:  GENERAL: alert, oriented, appears well  and in no acute distress  HEENT: atraumatic, conjunttiva clear, no obvious abnormalities on inspection of external nose and ears  NECK: normal movements of the head and neck  LUNGS: on inspection no signs of respiratory distress, breathing rate appears normal, no obvious gross SOB, gasping or wheezing  CV: no obvious cyanosis  MS: moves all visible extremities without noticeable abnormality  PSYCH/NEURO: pleasant and cooperative,  no obvious depression or anxiety, speech and thought processing grossly intact  ASSESSMENT AND PLAN:  Discussed the following assessment and plan:  Influenza Like Illness  Cough  -we discussed possible serious and likely etiologies, options for evaluation and workup, limitations of telemedicine visit vs in person visit, treatment, treatment risks and precautions. Pt prefers to treat via telemedicine empirically rather than in person at this moment.  Suspect influenza most likely versus other.  Discussed options for evaluation and treatment.  After lengthy discussion of risk and benefits of Tamiflu, she has opted against this.  She did want an antiemetic for the nausea, sent Zofran 4 mg ODT to use every 8 hours as needed, #10.  Advised oral hydration.  She did not feel that she needed a medication for cough or any of the other symptoms at this time.  Advised Covid testing as well.  Advised staying home while sick and until fever and symptoms have resolved for 24 hours.  Discussed potential complications and precautions. Work/School slipped offered: provided in patient instructions   Scheduled follow up with PCP offered: She agrees to call for follow-up if needed. Advised to seek prompt in person care if worsening, new symptoms arise, or if is not improving with treatment. Discussed options for inperson care if PCP office not available. Did let this patient know that I only do telemedicine on Tuesdays and Thursdays for [place]. Advised to schedule follow up visit with PCP or [facility] if any further questions or concerns to avoid delays in care.   I discussed the assessment and treatment plan with the patient. The patient was provided an opportunity to ask questions and all were answered. The patient agreed with the plan and demonstrated an understanding of the instructions.     Lucretia Kern, DO

## 2020-02-28 NOTE — Patient Instructions (Addendum)
---------------------------------------------------------------------------------------------------------------------------    WORK SLIP:  Patient Kim Pham,  [birth year], was seen for a medical visit today, 02/28/20 . Please excuse from work according to the [facility] guidelines for a COVID like illness. We advise 10 days minimum from the onset of symptoms (02/24/2020) PLUS 1 day of no fever and improved symptoms. Will defer to employer for a sooner return to work if Iatan testing is negative and the symptoms have resolved. Advise following CDC guidelines.    Sincerely: E-signature: Dr. Colin Benton, DO [facility] Ph: [phone]   ------------------------------------------------------------------------------------------------------------------------------   Influenza, Adult Influenza is also called "the flu." It is an infection in the lungs, nose, and throat (respiratory tract). It is caused by a virus. The flu causes symptoms that are similar to symptoms of a cold. It also causes a high fever and body aches. The flu spreads easily from person to person (is contagious). Getting a flu shot (influenza vaccination) every year is the best way to prevent the flu. What are the causes? This condition is caused by the influenza virus. You can get the virus by:  Breathing in droplets that are in the air from the cough or sneeze of a person who has the virus.  Touching something that has the virus on it (is contaminated) and then touching your mouth, nose, or eyes. What increases the risk? Certain things may make you more likely to get the flu. These include:  Not washing your hands often.  Having close contact with many people during cold and flu season.  Touching your mouth, eyes, or nose without first washing your hands.  Not getting a flu shot every year. You may have a higher risk for the flu, along with serious problems such as a lung infection  (pneumonia), if you:  Are older than 65.  Are pregnant.  Have a weakened disease-fighting system (immune system) because of a disease or taking certain medicines.  Have a long-term (chronic) illness, such as: ? Heart, kidney, or lung disease. ? Diabetes. ? Asthma.  Have a liver disorder.  Are very overweight (morbidly obese).  Have anemia. This is a condition that affects your red blood cells. What are the signs or symptoms? Symptoms usually begin suddenly and last 4-14 days. They may include:  Fever and chills.  Headaches, body aches, or muscle aches.  Sore throat.  Cough.  Runny or stuffy (congested) nose.  Chest discomfort.  Not wanting to eat as much as normal (poor appetite).  Weakness or feeling tired (fatigue).  Dizziness.  Feeling sick to your stomach (nauseous) or throwing up (vomiting). How is this treated? If the flu is found early, you can be treated with medicine that can help reduce how bad the illness is and how long it lasts (antiviral medicine). This may be given by mouth (orally) or through an IV tube. Taking care of yourself at home can help your symptoms get better. Your doctor may suggest:  Taking over-the-counter medicines.  Drinking plenty of fluids. The flu often goes away on its own. If you have very bad symptoms or other problems, you may be treated in a hospital. Follow these instructions at home:     Activity  Rest as needed. Get plenty of sleep.  Stay home from work or school as told by your doctor. ? Do not leave home until you do not have a fever for 24 hours without taking medicine. ? Leave home only to visit your doctor. Eating  and drinking  Take an ORS (oral rehydration solution). This is a drink that is sold at pharmacies and stores.  Drink enough fluid to keep your pee (urine) pale yellow.  Drink clear fluids in small amounts as you are able. Clear fluids include: ? Water. ? Ice chips. ? Fruit juice that has water  added (diluted fruit juice). ? Low-calorie sports drinks.  Eat bland, easy-to-digest foods in small amounts as you are able. These foods include: ? Bananas. ? Applesauce. ? Rice. ? Lean meats. ? Toast. ? Crackers.  Do not eat or drink: ? Fluids that have a lot of sugar or caffeine. ? Alcohol. ? Spicy or fatty foods. General instructions  Take over-the-counter and prescription medicines only as told by your doctor.  Use a cool mist humidifier to add moisture to the air in your home. This can make it easier for you to breathe.  Cover your mouth and nose when you cough or sneeze.  Wash your hands with soap and water often, especially after you cough or sneeze. If you cannot use soap and water, use alcohol-based hand sanitizer.  Keep all follow-up visits as told by your doctor. This is important. How is this prevented?   Get a flu shot every year. You may get the flu shot in late summer, fall, or winter. Ask your doctor when you should get your flu shot.  Avoid contact with people who are sick during fall and winter (cold and flu season). Contact a doctor if:  You get new symptoms.  You have: ? Chest pain. ? Watery poop (diarrhea). ? A fever.  Your cough gets worse.  You start to have more mucus.  You feel sick to your stomach.  You throw up. Get help right away if you:  Have shortness of breath.  Have trouble breathing.  Have skin or nails that turn a bluish color.  Have very bad pain or stiffness in your neck.  Get a sudden headache.  Get sudden pain in your face or ear.  Cannot eat or drink without throwing up. Summary  Influenza ("the flu") is an infection in the lungs, nose, and throat. It is caused by a virus.  Take over-the-counter and prescription medicines only as told by your doctor.  Getting a flu shot every year is the best way to avoid getting the flu. This information is not intended to replace advice given to you by your health care  provider. Make sure you discuss any questions you have with your health care provider. Document Revised: 09/21/2017 Document Reviewed: 09/21/2017 Elsevier Patient Education  [place].

## 2020-02-29 DIAGNOSIS — Z20822 Contact with and (suspected) exposure to covid-19: Secondary | ICD-10-CM | POA: Diagnosis not present

## 2020-03-24 ENCOUNTER — Ambulatory Visit: Payer: Medicaid Other | Admitting: Family

## 2020-04-25 ENCOUNTER — Ambulatory Visit (INDEPENDENT_AMBULATORY_CARE_PROVIDER_SITE_OTHER): Payer: BC Managed Care – PPO | Admitting: Family

## 2020-04-25 ENCOUNTER — Encounter: Payer: Self-pay | Admitting: Family

## 2020-04-25 ENCOUNTER — Other Ambulatory Visit: Payer: Self-pay

## 2020-04-25 VITALS — BP 121/68 | HR 93 | Temp 98.4°F | Resp 16 | Ht 69.0 in | Wt 231.0 lb

## 2020-04-25 DIAGNOSIS — G253 Myoclonus: Secondary | ICD-10-CM | POA: Diagnosis not present

## 2020-04-25 DIAGNOSIS — Z87898 Personal history of other specified conditions: Secondary | ICD-10-CM

## 2020-04-25 DIAGNOSIS — G4700 Insomnia, unspecified: Secondary | ICD-10-CM

## 2020-04-25 DIAGNOSIS — F1010 Alcohol abuse, uncomplicated: Secondary | ICD-10-CM | POA: Diagnosis not present

## 2020-04-25 DIAGNOSIS — F32A Depression, unspecified: Secondary | ICD-10-CM | POA: Diagnosis not present

## 2020-04-25 MED ORDER — TRAZODONE HCL 100 MG PO TABS: 100.0000 mg | ORAL_TABLET | Freq: Every day | ORAL | 1 refills | Status: DC

## 2020-04-25 NOTE — Progress Notes (Signed)
Subjective:    Patient ID: Kim Pham, female    DOB: 28-Sep-1980, 40 y.o.   MRN: [id number]  HPI  Patient is a 40 yr old female who presents today to discuss ongoing intermittent involuntary muscle jerks in her arms/legs.   She first discussed this with [place] back in November 2021.  We recommended d/c trazodone in case this was contributing.  No improvement with this discontinuation. She then stopped the prozac without improvement. Describes that it feels "like popcorn under my skin sometimes." She sometimes will see muscle fasciculations with these sensations and other times she will not. She also reports intermittent Involuntary movements in the middle of the night. Not sleeping well as a result which is triggering her migraines. Notes increased clumsiness with her balance and forgetfulness. Had some mild dizziness/nausea with her migraine yesterday.   Last visit she also noted increased alcohol intake and worsening depression.  We increased prozac from 10mg  to 30mg  once daily.    She is drinking 1 beer a week only.  Mood wise "I am OK." stopped prozac 2 weeks ago. She reports mood is stable off of prozac.  Her main concern is insomnia since she has been off of trazodone.    Right now she just "feels irritable due to not sleeping."  Denies feeling depressed.   Review of Systems    see HPI  Past Medical History:  Diagnosis Date  . Anxiety   . Arthritis   . Chiari malformation   . Chronic headaches   . Endometriosis   . GERD (gastroesophageal reflux disease)    tums otc, prevacid prn  . Headache   . Low blood pressure    pt states history of low blood pressure-fainted 12/2010-instructed to move slowly  . SLE (systemic lupus erythematosus) ([place]) 03/26/2016     Social History   Socioeconomic History  . Marital status: Married    Spouse name: Not on file  . Number of children: 1  . Years of education: Assoc  . Highest education level: Not on file  Occupational History  .  Occupation: nurse  Tobacco Use  . Smoking status: Former Smoker    Packs/day: 1.00    Years: 10.00    Pack years: 10.00    Types: Cigarettes    Quit date: 04/04/2004    Years since quitting: 16.0  . Smokeless tobacco: Never Used  Vaping Use  . Vaping Use: Never used  Substance and Sexual Activity  . Alcohol use: No  . Drug use: No  . Sexual activity: Not on file  Other Topics Concern  . 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 ABDOMINAL HYSTERECTOMY  2007  . CYSTOSCOPY  2007  . DIAGNOSTIC LAPAROSCOPY  2005  . ROTATOR CUFF REPAIR  2010   right shoulder  . SALPINGOOPHORECTOMY  04/16/2011   Procedure: SALPINGO OOPHERECTOMY;  Surgeon: Lovenia Kim, MD;  Location: [facility];  Service: Gynecology;  Laterality: Right;  . skull decompression surgery 2005      Family History  Problem Relation Age of Onset  . Hypertension Mother   . CAD Mother   . Breast cancer Mother   . Migraines Mother   . Gout Mother   . Heart disease Mother   . Crohn's disease Mother   . Hypertension Father   . CAD Father   . CVA Father   . Heart disease Father   . Sjogren's syndrome Sister   . Celiac disease Sister   . Rheum arthritis Maternal Grandmother   . Autism Son   . ADD / ADHD Son   . Pancreatic cancer Maternal Grandfather     Allergies  Allergen Reactions  . Effexor [Venlafaxine] Nausea Only    insomnia    Current Outpatient Medications on File Prior to Visit  Medication Sig Dispense Refill  . FLUoxetine  (PROZAC) 10 MG capsule Take 3 capsules (30 mg total) by mouth daily. 270 capsule 1  . hydroxychloroquine (PLAQUENIL) 200 MG tablet TAKE ONE TABLET TWO TIMES A DAY MONDAY THROUGH FRIDAY. (Patient taking differently: Take 200 mg by mouth as directed. TAKE 200mg  TWO TIMES A DAY MONDAY THROUGH FRIDAY.) 40 tablet 0  . ibuprofen (ADVIL,MOTRIN) 200 MG tablet Take 600 mg by mouth every 6 (six) hours as needed for headache or mild pain.    . Multiple Vitamins-Minerals (MULTIVITAMIN WITH MINERALS) tablet Take by mouth daily.    . ondansetron (ZOFRAN ODT) 4 MG disintegrating tablet Take 1 tablet (4 mg total) by mouth every 8 (eight) hours as needed for nausea or vomiting. 10 tablet 0  . pantoprazole (PROTONIX) 40 MG tablet Take 1 tablet (40 mg total) by mouth daily. 30 tablet 3  . SUMAtriptan (IMITREX) 100 MG tablet Take 1 tablet (100 mg total) by mouth at onset of migraine. May repeat in 2 hours if headache persists or recurs. Do not take more than 2 doses in 24 hours. 10 tablet 2  . traZODone (DESYREL) 100 MG tablet Take 1 tablet (100 mg total) by mouth at bedtime.     Current Facility-Administered Medications on File Prior to Visit  Medication Dose Route Frequency Provider Last Rate Last Admin  . 0.9 %  sodium chloride infusion  500 mL Intravenous Once Jackquline Denmark, MD        BP 121/68 (BP Location: Right Arm, Patient Position: Sitting, Cuff Size: Large)   Pulse 93   Temp 98.4 F (36.9 C) (Oral)   Resp 16   Ht 5\' 9"  (1.753 m)   Wt 231 lb (104.8 kg)   SpO2 99%   BMI 34.11 kg/m    Objective:   Physical Exam Constitutional:      Appearance: She is well-developed and well-nourished.  Eyes:     Extraocular Movements: Extraocular movements intact.     Pupils: Pupils are equal, round, and reactive to light.  Neck:     Thyroid: No thyromegaly.  Cardiovascular:     Rate and Rhythm: Normal rate and regular rhythm.     Heart sounds: Normal heart sounds. No murmur heard.   Pulmonary:      Effort: Pulmonary effort is normal. No respiratory distress.     Breath sounds: Normal breath sounds. No wheezing.  Musculoskeletal:     Cervical back: Neck supple.  Skin:    General: Skin is warm and dry.  Neurological:     General: No focal deficit present.     Mental Status:  She is alert and oriented to person, place, and time.     Cranial Nerves: No cranial nerve deficit.     Motor: No weakness.     Comments: No twitching/jerking or fasciculations noted at time of exam  Psychiatric:        Mood and Affect: Mood and affect normal.        Behavior: Behavior normal.        Thought Content: Thought content normal.        Judgment: Judgment normal.           Assessment & Plan:  Depression- currently stable off of prozac.  I advised the patient okay to remain off of Prozac for now.  She will reach out to me if she feels like her depression symptoms return.  Insomnia-uncontrolled.  Since she has not had any improvement in her muscle jerking off of the trazodone, I advised her okay to restart.  myoclonus-the patient does have a history of seizure disorder, she has not had any LOC thankfully.  This could represent atypical seizure activity, MS is another consideration.  I have  advised the patient that I would like for her to have a consultation with neurology. She is agreeable and would like to see [facility].  Alcohol abuse- she has cut down considerably and is only drinking one beer/week.  I commended her on this.  Monitor.   This visit occurred during the SARS-CoV-2 public health emergency.  Safety protocols were in place, including screening questions prior to the visit, additional usage of staff PPE, and extensive cleaning of exam room while observing appropriate contact time as indicated for disinfecting solutions.

## 2020-04-27 ENCOUNTER — Encounter: Payer: Self-pay | Admitting: Family

## 2020-04-28 ENCOUNTER — Encounter: Payer: Self-pay | Admitting: Neurology

## 2020-04-28 MED ORDER — OSELTAMIVIR PHOSPHATE 75 MG PO CAPS: 75.0000 mg | ORAL_CAPSULE | Freq: Two times a day (BID) | ORAL | 0 refills | Status: AC

## 2020-06-17 ENCOUNTER — Encounter: Payer: Self-pay | Admitting: Family

## 2020-06-24 NOTE — Progress Notes (Signed)
NEUROLOGY CONSULTATION NOTE  Kim Pham MRN: [id number] DOB: 08-Feb-1981  Referring provider: Debbrah Alar, NP Primary care provider: Debbrah Alar, NP  Reason for consult:  Muscle jerks, history of seizures  Assessment/Plan:   92.  40 year old woman with prior Chiari malformation decompression presenting with: -  Muscle fasciculations -  Myoclonus -  Hypnagogic jerks -  Cognitive deficits -  Unsteady gait Exam reveals right Babinski reflex and numbness in the right lower extremity below the knee - no other abnormalities  1.  Will check MRI of brain/cervical/thoracic spine with and without contrast 2.  Will check B12 level 3.  Will order neuropsychological evaluation 4.  Pending MRI results, will consider EEG and NCV-EMG 5.  Follow up after testing   Subjective:  Kim Pham is a 40 year old right-handed female with SLE, migraines and history of Chiari malformation s/p decompression who presents for involuntary muscle jerks and history of seizures.  History supplemented by primary care notes.  MRI of brain in May 2018 personally reviewed.   In late 2021, she began experiencing fasciculations involving her extremities.  It feels like popcorn popping under her skin.  Sometimes she can see the rippling on her arm or leg.  She started having body jerks while laying in bed at night when she is relaxed and may be close to sleep.  She then started experiencing jerks during the day.  If she is typing, her hand may jerk.  She reports feeling off-balance and clumsy.  She started having short term memory problems and started needing to write things down at work.  She is a Midwife.  TSH from November 2021 was 0.92.  She also reports both intermittent word-finding difficulty and stuttering speech.  She had previously had COVID twice in 2020, the last case a year prior to onset of these symptoms.  In 2005, she began having syncopal spells and headaches.  She also  described episodes of "zoning out" in which she loses awareness and exhibits twitching of the left upper and lower extremities for several minutes at a time.  MRI of brain without contrast in 2005 showed cerebellar ectopia approximately 6 mm below foramen magnum but no acute intracranial abnormality.  She subsequently underwent Chiari decompression.  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  She had an extensive neurologic workup in 2017 and 2018.    Routine EEG in September 2017 showed left temporal slowing.  Follow up prolonged sleep deprived EEG the following month was normal.  MRI of brain with and without contrast showed prior suboccipital decompression but otherwise unremarkable.  She was admitted to the epilepsy monitoring unit at [facility] in July 2018 which captured 2 habitual events of unresponsiveness but without the extremity jerking, which did not exhibit electrographic correlate, consistent with nonepileptic events but unclear if they may be migrainous as she wakes up afterwards with a headache.    PAST MEDICAL HISTORY: Past Medical History:  Diagnosis Date  . Anxiety   . Arthritis   . Chiari malformation   . Chronic headaches   . Endometriosis   . GERD (gastroesophageal reflux disease)    tums otc, prevacid prn  . Headache   . Low blood pressure    pt states history of low blood pressure-fainted 12/2010-instructed to move slowly  . SLE (systemic lupus erythematosus) ([place]) 03/26/2016    PAST SURGICAL HISTORY: Past Surgical History:  Procedure Laterality Date  . ABDOMINAL HYSTERECTOMY  2007  .  CYSTOSCOPY  2007  . DIAGNOSTIC LAPAROSCOPY  2005  . ROTATOR CUFF REPAIR  2010   right shoulder  . SALPINGOOPHORECTOMY  04/16/2011   Procedure: SALPINGO OOPHERECTOMY;  Surgeon: Lovenia Kim, MD;  Location: [facility];  Service: Gynecology;  Laterality: Right;  . skull decompression surgery 2005      MEDICATIONS: Current Outpatient Medications on File Prior to Visit  Medication  Sig Dispense Refill  . hydroxychloroquine (PLAQUENIL) 200 MG tablet TAKE ONE TABLET TWO TIMES A DAY MONDAY THROUGH FRIDAY. (Patient taking differently: Take 200 mg by mouth as directed. TAKE 200mg  TWO TIMES A DAY MONDAY THROUGH FRIDAY.) 40 tablet 0  . ibuprofen (ADVIL,MOTRIN) 200 MG tablet Take 600 mg by mouth every 6 (six) hours as needed for headache or mild pain.    . Multiple Vitamins-Minerals (MULTIVITAMIN WITH MINERALS) tablet Take by mouth daily.    . ondansetron (ZOFRAN ODT) 4 MG disintegrating tablet Take 1 tablet (4 mg total) by mouth every 8 (eight) hours as needed for nausea or vomiting. 10 tablet 0  . pantoprazole (PROTONIX) 40 MG tablet Take 1 tablet (40 mg total) by mouth daily. 30 tablet 3  . SUMAtriptan (IMITREX) 100 MG tablet Take 1 tablet (100 mg total) by mouth at onset of migraine. May repeat in 2 hours if headache persists or recurs. Do not take more than 2 doses in 24 hours. 10 tablet 2  . traZODone (DESYREL) 100 MG tablet Take 1 tablet (100 mg total) by mouth at bedtime. 90 tablet 1   Current Facility-Administered Medications on File Prior to Visit  Medication Dose Route Frequency Provider Last Rate Last Admin  . 0.9 %  sodium chloride infusion  500 mL Intravenous Once Jackquline Denmark, MD        ALLERGIES: Allergies  Allergen Reactions  . Effexor [Venlafaxine] Nausea Only    insomnia    FAMILY HISTORY: Family History  Problem Relation Age of Onset  . Hypertension Mother   . CAD Mother   . Breast cancer Mother   . Migraines Mother   . Gout Mother   . Heart disease Mother   . Crohn's disease Mother   . Hypertension Father   . CAD Father   . CVA Father   . Heart disease Father   . Sjogren's syndrome Sister   . Celiac disease Sister   . Rheum arthritis Maternal Grandmother   . Autism Son   . ADD / ADHD Son   . Pancreatic cancer Maternal Grandfather     Objective:  Blood pressure 130/84, pulse (!) 101, height 5\' 9"  (1.753 m), weight 228 lb 6.4 oz (103.6  kg), SpO2 99 %. General: No acute distress.  Patient appears well-groomed.   Head:  Normocephalic/atraumatic Eyes:  fundi examined but not visualized Neck: supple, no paraspinal tenderness, full range of motion Back: No paraspinal tenderness Heart: regular rate and rhythm Lungs: Clear to auscultation bilaterally. Vascular: No carotid bruits. Neurological Exam: Mental status: alert and oriented to person, place, and time, recent and remote memory intact, fund of knowledge intact, attention and concentration intact, speech fluent and not dysarthric, language intact. Cranial nerves: CN I: not tested CN II: pupils equal, round and reactive to light, visual fields intact CN III, IV, VI:  full range of motion, no nystagmus, no ptosis CN V: facial sensation intact. CN VII: upper and lower face symmetric CN VIII: hearing intact CN IX, X: gag intact, uvula midline CN XI: sternocleidomastoid and trapezius muscles intact CN XII: tongue midline  Bulk & Tone: normal, no fasciculations. Motor:  muscle strength 5/5 throughout Sensation:  Decreased pinprick sensation in right lower extremity below the knee.  Otherwise, pinprick and vibratory sensation intact. Deep Tendon Reflexes:  2+ throughout,  right Babinski, toes downgoing on left Finger to nose testing:  Without dysmetria.   Heel to shin:  Without dysmetria.   Gait:  Normal station and stride.  Romberg negative.    Thank you for allowing me to take part in the care of this patient.  Metta Clines, DO  CC:  Debbrah Alar, [place]

## 2020-06-25 ENCOUNTER — Other Ambulatory Visit: Payer: BC Managed Care – PPO

## 2020-06-25 ENCOUNTER — Encounter: Payer: Self-pay | Admitting: Neurology

## 2020-06-25 ENCOUNTER — Other Ambulatory Visit: Payer: Self-pay

## 2020-06-25 ENCOUNTER — Ambulatory Visit (INDEPENDENT_AMBULATORY_CARE_PROVIDER_SITE_OTHER): Payer: BC Managed Care – PPO | Admitting: Neurology

## 2020-06-25 VITALS — BP 130/84 | HR 101 | Ht 69.0 in | Wt 228.4 lb

## 2020-06-25 DIAGNOSIS — R4189 Other symptoms and signs involving cognitive functions and awareness: Secondary | ICD-10-CM | POA: Diagnosis not present

## 2020-06-25 DIAGNOSIS — G253 Myoclonus: Secondary | ICD-10-CM

## 2020-06-25 DIAGNOSIS — R292 Abnormal reflex: Secondary | ICD-10-CM

## 2020-06-25 DIAGNOSIS — R253 Fasciculation: Secondary | ICD-10-CM

## 2020-06-25 DIAGNOSIS — F518 Other sleep disorders not due to a substance or known physiological condition: Secondary | ICD-10-CM

## 2020-06-25 DIAGNOSIS — Z8669 Personal history of other diseases of the nervous system and sense organs: Secondary | ICD-10-CM

## 2020-06-25 DIAGNOSIS — R200 Anesthesia of skin: Secondary | ICD-10-CM

## 2020-06-25 LAB — VITAMIN B12: Vitamin B-12: 238 pg/mL (ref 211–911)

## 2020-06-25 NOTE — Patient Instructions (Addendum)
1.  Check MRI of brain/cervical/thoracic spine with and without contrast 2.  Schedule for neuropsychological testing 3.  Check B12 4.  Further recommendations pending results.  Follow up after testing

## 2020-06-26 NOTE — Progress Notes (Signed)
LMOVM for pt to call back in regards to Labs results.

## 2020-06-30 ENCOUNTER — Telehealth: Payer: Self-pay

## 2020-06-30 NOTE — Telephone Encounter (Signed)
Called patient and informed her of results and recommendations. Patient verbalized understanding and had no questions and concerns.

## 2020-06-30 NOTE — Telephone Encounter (Signed)
-----   Message from Pieter Partridge, DO sent at 06/26/2020  6:12 AM EST ----- B12 is in the lower limits of normal.  Would recommend taking OTC B12 108mcg daily.

## 2020-07-13 ENCOUNTER — Ambulatory Visit
Admission: RE | Admit: 2020-07-13 | Discharge: 2020-07-13 | Disposition: A | Discharge status: Home / Self Care | Payer: 59 | Source: Ambulatory Visit | Attending: Neurology | Admitting: Neurology

## 2020-07-13 ENCOUNTER — Ambulatory Visit: Payer: BC Managed Care – PPO

## 2020-07-13 DIAGNOSIS — R292 Abnormal reflex: Secondary | ICD-10-CM

## 2020-07-13 DIAGNOSIS — Z8669 Personal history of other diseases of the nervous system and sense organs: Secondary | ICD-10-CM

## 2020-07-13 DIAGNOSIS — R200 Anesthesia of skin: Secondary | ICD-10-CM

## 2020-07-15 ENCOUNTER — Ambulatory Visit
Admission: RE | Admit: 2020-07-15 | Discharge: 2020-07-15 | Disposition: A | Discharge status: Home / Self Care | Payer: BC Managed Care – PPO | Source: Ambulatory Visit | Attending: Neurology | Admitting: Neurology

## 2020-07-15 ENCOUNTER — Other Ambulatory Visit: Payer: Self-pay

## 2020-07-15 DIAGNOSIS — M4804 Spinal stenosis, thoracic region: Secondary | ICD-10-CM | POA: Diagnosis not present

## 2020-07-15 DIAGNOSIS — M5134 Other intervertebral disc degeneration, thoracic region: Secondary | ICD-10-CM | POA: Diagnosis not present

## 2020-07-15 DIAGNOSIS — R292 Abnormal reflex: Secondary | ICD-10-CM

## 2020-07-15 DIAGNOSIS — Z8669 Personal history of other diseases of the nervous system and sense organs: Secondary | ICD-10-CM

## 2020-07-15 DIAGNOSIS — R200 Anesthesia of skin: Secondary | ICD-10-CM | POA: Diagnosis not present

## 2020-07-15 DIAGNOSIS — M5124 Other intervertebral disc displacement, thoracic region: Secondary | ICD-10-CM | POA: Diagnosis not present

## 2020-07-15 MED ORDER — GADOBENATE DIMEGLUMINE 529 MG/ML IV SOLN
20.0000 mL | Freq: Once | INTRAVENOUS | Status: AC | PRN
  Administered 2020-07-15: 20 mL via INTRAVENOUS

## 2020-07-16 ENCOUNTER — Other Ambulatory Visit: Payer: Self-pay | Admitting: Neurology

## 2020-07-17 ENCOUNTER — Other Ambulatory Visit: Payer: Self-pay | Admitting: Neurology

## 2020-07-17 MED ORDER — DIAZEPAM 5 MG PO TABS: ORAL_TABLET | ORAL | 0 refills | Status: DC

## 2020-07-21 NOTE — Progress Notes (Signed)
Pt advised of her MRi

## 2020-07-24 ENCOUNTER — Encounter: Payer: Self-pay | Admitting: Family

## 2020-07-25 ENCOUNTER — Encounter: Payer: BC Managed Care – PPO | Admitting: Family

## 2020-07-25 NOTE — Progress Notes (Incomplete)
Subjective:   By signing my name below, I, Kim Pham, attest that this documentation has been prepared under the direction and in the presence of Kim Pham. 07/25/20     Patient ID: Kim Pham, female    DOB: 07/06/1980, 40 y.o.   MRN: [id number]  No chief complaint on file.   HPI Patient is in today for comprehensive physical exam.   Past Medical History:  Diagnosis Date  . Anxiety   . Arthritis   . Chiari malformation   . Chronic headaches   . Endometriosis   . GERD (gastroesophageal reflux disease)    tums otc, prevacid prn  . Headache   . Low blood pressure    pt states history of low blood pressure-fainted 12/2010-instructed to move slowly  . SLE (systemic lupus erythematosus) ([place]) 03/26/2016    Past Surgical History:  Procedure Laterality Date  . ABDOMINAL HYSTERECTOMY  2007  . CYSTOSCOPY  2007  . DIAGNOSTIC LAPAROSCOPY  2005  . ROTATOR CUFF REPAIR  2010   right shoulder  . SALPINGOOPHORECTOMY  04/16/2011   Procedure: SALPINGO OOPHERECTOMY;  Surgeon: Lovenia Kim, MD;  Location: [facility];  Service: Gynecology;  Laterality: Right;  . skull decompression surgery 2005      Family History  Problem Relation Age of Onset  . Hypertension Mother   . CAD Mother   . Breast cancer Mother   . Migraines Mother   . Gout Mother   . Heart disease Mother   . Crohn's disease Mother   . Hypertension Father   . CAD Father   . CVA Father   . Heart disease Father   . Sjogren's syndrome Sister   . Celiac disease Sister   . Rheum arthritis Maternal Grandmother   . Autism Son   . ADD / ADHD Son   . Pancreatic cancer Maternal Grandfather     Social History   Socioeconomic History  . Marital status: Married    Spouse name: Not on file  . Number of children: 1  . Years of education: Assoc  . Highest education level: Not on file  Occupational History  . Occupation: nurse  Tobacco Use  . Smoking status: Former Smoker    Packs/day: 1.00     Years: 10.00    Pack years: 10.00    Types: Cigarettes    Quit date: 04/04/2004    Years since quitting: 16.3  . Smokeless tobacco: Never Used  Vaping Use  . Vaping Use: Never used  Substance and Sexual Activity  . Alcohol use: Yes    Comment: occ.  . Drug use: No  . Sexual activity: Not on file  Other Topics Concern  . 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 diazepam (VALIUM) 5 MG tablet Take 1 tablet 30 to 40 minutes prior to MRI 1 tablet 0  .  hydroxychloroquine (PLAQUENIL) 200 MG tablet TAKE ONE TABLET TWO TIMES A DAY MONDAY THROUGH FRIDAY. (Patient taking differently: Take 200 mg by mouth as directed. TAKE 200mg  TWO TIMES A DAY MONDAY THROUGH FRIDAY.) 40 tablet 0  . ibuprofen (ADVIL,MOTRIN) 200 MG tablet Take 600 mg by mouth every 6 (six) hours as needed for headache or mild pain.    . Multiple Vitamins-Minerals (MULTIVITAMIN WITH MINERALS) tablet Take by mouth daily.    . ondansetron (ZOFRAN ODT) 4 MG disintegrating tablet Take 1 tablet (4 mg total) by mouth every 8 (eight) hours as needed for nausea or vomiting. 10 tablet 0  . pantoprazole (PROTONIX) 40 MG tablet Take 1 tablet (40 mg total) by mouth daily. 30 tablet 3  . phentermine 37.5 MG capsule Take 37.5 mg by mouth every morning.    . SUMAtriptan (IMITREX) 100 MG tablet Take 1 tablet (100 mg total) by mouth at onset of migraine. May repeat in 2 hours if headache persists or recurs. Do not take more than 2  doses in 24 hours. 10 tablet 2  . traZODone (DESYREL) 100 MG tablet Take 1 tablet (100 mg total) by mouth at bedtime. 90 tablet 1   Facility-Administered Medications Prior to Visit  Medication Dose Route Frequency Provider Last Rate Last Admin  . 0.9 %  sodium chloride infusion  500 mL Intravenous Once Jackquline Denmark, MD        Allergies  Allergen Reactions  . Effexor [Venlafaxine] Nausea Only    insomnia    ROS     Objective:    Physical Exam Constitutional:      General: She is not in acute distress.    Appearance: Normal appearance. She is not ill-appearing.  HENT:     Head: Normocephalic and atraumatic.     Right Ear: External ear normal.     Left Ear: External ear normal.  Eyes:     Extraocular Movements: Extraocular movements intact.     Pupils: Pupils are equal, round, and reactive to light.  Cardiovascular:     Rate and Rhythm: Normal rate and regular rhythm.     Pulses: Normal pulses.     Heart sounds: Normal heart sounds.  Pulmonary:     Effort: Pulmonary effort is normal. No respiratory distress.     Breath sounds: Normal breath sounds. No wheezing, rhonchi or rales.  Neurological:     Mental Status: She is alert and oriented to person, place, and time.  Psychiatric:        Behavior: Behavior normal.     There were no vitals taken for this visit. Wt Readings from Last 3 Encounters:  06/25/20 228 lb 6.4 oz (103.6 kg)  04/25/20 231 lb (104.8 kg)  02/25/20 225 lb (102.1 kg)    Diabetic Foot Exam - Simple   No data filed    Lab Results  Component Value Date   WBC 7.4 07/14/2018   HGB 12.7 07/14/2018   HCT 40.2 07/14/2018   PLT 282 07/14/2018   GLUCOSE 81 02/25/2020   ALT 14 07/22/2017   AST 14 07/22/2017   NA 135 02/25/2020   K 4.5 02/25/2020   CL 100 02/25/2020   CREATININE 0.77 02/25/2020   BUN 10 02/25/2020   CO2 26 02/25/2020   TSH 0.92 02/25/2020    Lab Results  Component Value Date   TSH 0.92 02/25/2020   Lab Results  Component  Value Date   WBC 7.4 07/14/2018   HGB 12.7 07/14/2018   HCT  40.2 07/14/2018   MCV 92.0 07/14/2018   PLT 282 07/14/2018   Lab Results  Component Value Date   NA 135 02/25/2020   K 4.5 02/25/2020   CO2 26 02/25/2020   GLUCOSE 81 02/25/2020   BUN 10 02/25/2020   CREATININE 0.77 02/25/2020   BILITOT 0.4 07/22/2017   ALKPHOS 56 11/19/2016   AST 14 07/22/2017   ALT 14 07/22/2017   PROT 6.4 07/22/2017   ALBUMIN 4.4 11/19/2016   CALCIUM 9.0 02/25/2020   ANIONGAP 8 07/14/2018   GFR 96.87 02/25/2020   No results found for: CHOL No results found for: HDL No results found for: LDLCALC No results found for: TRIG No results found for: CHOLHDL No results found for: HGBA1C     Assessment & Plan:   Problem List Items Addressed This Visit   None     No orders of the defined types were placed in this encounter.   I, Kim Pham, personally preformed the services described in this documentation.  All medical record entries made by the scribe were at my direction and in my presence.  I have reviewed the chart and discharge instructions (if applicable) and agree that the record reflects my personal performance and is accurate and complete. 07/25/2020  I,Alexis Bryant,acting as a scribe for Nance Pear, NP.,have documented all relevant documentation on the behalf of Nance Pear, NP,as directed by  Nance Pear, NP while in the presence of Nance Pear, NP.  Kim Pham

## 2020-07-27 ENCOUNTER — Ambulatory Visit
Admission: RE | Admit: 2020-07-27 | Discharge: 2020-07-27 | Disposition: A | Discharge status: Home / Self Care | Payer: 59 | Source: Ambulatory Visit | Attending: Neurology | Admitting: Neurology

## 2020-07-27 ENCOUNTER — Other Ambulatory Visit: Payer: Self-pay

## 2020-07-27 DIAGNOSIS — R292 Abnormal reflex: Secondary | ICD-10-CM

## 2020-07-27 DIAGNOSIS — M50222 Other cervical disc displacement at C5-C6 level: Secondary | ICD-10-CM | POA: Diagnosis not present

## 2020-07-27 DIAGNOSIS — M4802 Spinal stenosis, cervical region: Secondary | ICD-10-CM | POA: Diagnosis not present

## 2020-07-27 DIAGNOSIS — Q0700 Arnold-Chiari syndrome without spina bifida or hydrocephalus: Secondary | ICD-10-CM | POA: Diagnosis not present

## 2020-07-27 DIAGNOSIS — Z8669 Personal history of other diseases of the nervous system and sense organs: Secondary | ICD-10-CM

## 2020-07-27 DIAGNOSIS — R200 Anesthesia of skin: Secondary | ICD-10-CM

## 2020-07-27 DIAGNOSIS — M50223 Other cervical disc displacement at C6-C7 level: Secondary | ICD-10-CM | POA: Diagnosis not present

## 2020-07-27 DIAGNOSIS — M5021 Other cervical disc displacement,  high cervical region: Secondary | ICD-10-CM | POA: Diagnosis not present

## 2020-07-27 MED ORDER — GADOBENATE DIMEGLUMINE 529 MG/ML IV SOLN
20.0000 mL | Freq: Once | INTRAVENOUS | Status: AC | PRN
  Administered 2020-07-27: 20 mL via INTRAVENOUS

## 2020-07-29 ENCOUNTER — Other Ambulatory Visit: Payer: Self-pay

## 2020-07-29 DIAGNOSIS — R200 Anesthesia of skin: Secondary | ICD-10-CM

## 2020-07-29 DIAGNOSIS — R202 Paresthesia of skin: Secondary | ICD-10-CM

## 2020-07-29 NOTE — Progress Notes (Signed)
Pt advised of her Mri results.

## 2020-07-29 NOTE — Progress Notes (Signed)
Per Dr.Jaffe added order for EMG right arm and leg

## 2020-08-08 ENCOUNTER — Encounter: Payer: Self-pay | Admitting: Family

## 2020-08-08 MED ORDER — DICLOFENAC SODIUM 75 MG PO TBEC: 75.0000 mg | DELAYED_RELEASE_TABLET | Freq: Two times a day (BID) | ORAL | 0 refills | Status: DC | PRN

## 2020-08-14 ENCOUNTER — Encounter: Payer: BC Managed Care – PPO | Admitting: Psychology

## 2020-08-19 ENCOUNTER — Other Ambulatory Visit: Payer: Self-pay

## 2020-08-19 ENCOUNTER — Encounter: Payer: Self-pay | Admitting: Family

## 2020-08-19 ENCOUNTER — Ambulatory Visit (INDEPENDENT_AMBULATORY_CARE_PROVIDER_SITE_OTHER): Payer: 59 | Admitting: Family

## 2020-08-19 VITALS — BP 110/64 | HR 70 | Temp 98.3°F | Resp 16 | Ht 69.0 in | Wt 215.0 lb

## 2020-08-19 DIAGNOSIS — G4700 Insomnia, unspecified: Secondary | ICD-10-CM | POA: Diagnosis not present

## 2020-08-19 DIAGNOSIS — K219 Gastro-esophageal reflux disease without esophagitis: Secondary | ICD-10-CM

## 2020-08-19 DIAGNOSIS — F418 Other specified anxiety disorders: Secondary | ICD-10-CM

## 2020-08-19 DIAGNOSIS — Z0000 Encounter for general adult medical examination without abnormal findings: Secondary | ICD-10-CM

## 2020-08-19 HISTORY — DX: Gastro-esophageal reflux disease without esophagitis: K21.9

## 2020-08-19 HISTORY — DX: Insomnia, unspecified: G47.00

## 2020-08-19 MED ORDER — ESOMEPRAZOLE MAGNESIUM 20 MG PO CPDR: 20.0000 mg | DELAYED_RELEASE_CAPSULE | Freq: Every day | ORAL | Status: DC

## 2020-08-19 MED ORDER — TRAZODONE HCL 100 MG PO TABS: 100.0000 mg | ORAL_TABLET | Freq: Every day | ORAL | 1 refills | Status: DC

## 2020-08-19 NOTE — Assessment & Plan Note (Signed)
Stable on otc nexium 20mg  once daily. Continue same.

## 2020-08-19 NOTE — Assessment & Plan Note (Signed)
Stable on trazodone 100mg  HS.

## 2020-08-19 NOTE — Patient Instructions (Addendum)
Please keep up the good work with healthy diet, exercise and weight loss.

## 2020-08-19 NOTE — Assessment & Plan Note (Signed)
Reports stable mood without medication. Monitor.

## 2020-08-19 NOTE — Progress Notes (Signed)
Established Patient Office Visit  Subjective:  Patient ID: Kim Pham, female    DOB: 08-30-1980  Age: 40 y.o. MRN: [id number]  CC:  Chief Complaint  Patient presents with  . Annual Exam         HPI Kim Pham presents for cpx.  Immunizations:pfizer x 2.  Flu shot up to date. No Tetanus on file. 7/13 Diet: diet is improved. She has improved her diet and stopped drinking alcoho Wt Readings from Last 3 Encounters:  08/19/20 215 lb (97.5 kg)  06/25/20 228 lb 6.4 oz (103.6 kg)  04/25/20 231 lb (104.8 kg)  Exercise: joined the gym- goes 3-4 times a week.  Pap Smear: hysterectomy Mammogram: 3 yrs ago Vision: up to date Dental: up to date  Insomnia- pt uses trazodone HS with good relief.  GERD- she is now using OTC nexium with good relief.   Past Medical History:  Diagnosis Date  . Anxiety   . Arthritis   . Chiari malformation   . Chronic headaches   . Endometriosis   . GERD (gastroesophageal reflux disease)    tums otc, prevacid prn  . Headache   . Low blood pressure    pt states history of low blood pressure-fainted 12/2010-instructed to move slowly  . SLE (systemic lupus erythematosus) ([place]) 03/26/2016    Past Surgical History:  Procedure Laterality Date  . ABDOMINAL HYSTERECTOMY  2007  . CYSTOSCOPY  2007  . DIAGNOSTIC LAPAROSCOPY  2005  . ROTATOR CUFF REPAIR  2010   right shoulder  . SALPINGOOPHORECTOMY  04/16/2011   Procedure: SALPINGO OOPHERECTOMY;  Surgeon: Lovenia Kim, MD;  Location: [facility];  Service: Gynecology;  Laterality: Right;  . skull decompression surgery 2005      Family History  Problem Relation Age of Onset  . Hypertension Mother   . CAD Mother   . Breast cancer Mother   . Migraines Mother   . Gout Mother   . Heart disease Mother   . Crohn's disease Mother   . Hypertension Father   . CAD Father   . CVA Father   . Heart disease Father   . Sjogren's syndrome Sister   . Celiac disease Sister   . Rheum arthritis  Maternal Grandmother   . Autism Son   . ADD / ADHD Son   . Pancreatic cancer Maternal Grandfather     Social History   Socioeconomic History  . Marital status: Married    Spouse name: Not on file  . Number of children: 1  . Years of education: Assoc  . Highest education level: Not on file  Occupational History  . Occupation: nurse  Tobacco Use  . Smoking status: Former Smoker    Packs/day: 1.00    Years: 10.00    Pack years: 10.00    Types: Cigarettes    Quit date: 04/04/2004    Years since quitting: 16.3  . Smokeless tobacco: Never Used  Vaping Use  . Vaping Use: Never used  Substance and Sexual Activity  . Alcohol use: Yes    Comment: occ.  . Drug use: No  . Sexual activity: Not on file  Other Topics Concern  . 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 diazepam (VALIUM) 5 MG tablet Take 1 tablet 30 to 40 minutes prior to MRI 1 tablet 0  . diclofenac (VOLTAREN) 75 MG EC tablet Take 1 tablet (75 mg total) by mouth 2 (two) times daily as needed. 20 tablet 0  . hydroxychloroquine (PLAQUENIL) 200 MG tablet TAKE ONE TABLET TWO TIMES A DAY MONDAY THROUGH FRIDAY. (Patient taking differently: Take 200 mg by mouth as directed. TAKE 200mg  TWO TIMES A DAY MONDAY THROUGH FRIDAY.) 40 tablet 0  . ibuprofen (ADVIL,MOTRIN) 200 MG tablet Take 600 mg by mouth every 6 (six) hours as needed for headache or mild pain.    . Multiple Vitamins-Minerals (MULTIVITAMIN WITH  MINERALS) tablet Take by mouth daily.    . ondansetron (ZOFRAN ODT) 4 MG disintegrating tablet Take 1 tablet (4 mg total) by mouth every 8 (eight) hours as needed for nausea or vomiting. 10 tablet 0  . pantoprazole (PROTONIX) 40 MG tablet Take 1 tablet (40 mg total) by mouth daily. 30 tablet 3  . phentermine 37.5 MG capsule Take 37.5 mg by mouth every morning.    . SUMAtriptan (IMITREX) 100 MG tablet Take 1 tablet (100 mg total) by mouth at onset of migraine. May repeat in 2 hours if headache persists or recurs. Do not take more than 2 doses in 24 hours. 10 tablet 2  . traZODone (DESYREL) 100 MG tablet Take 1 tablet (100 mg total) by mouth at bedtime. 90 tablet 1   Facility-Administered Medications Prior to Visit  Medication Dose Route Frequency Provider Last Rate Last Admin  . 0.9 %  sodium chloride infusion  500 mL Intravenous Once Jackquline Denmark, MD        Allergies  Allergen Reactions  . Effexor [Venlafaxine] Nausea Only    insomnia    ROS Review of Systems  Constitutional: Negative for unexpected weight change.  HENT: Negative for hearing loss and rhinorrhea.   Eyes: Negative for visual disturbance.  Respiratory: Negative for cough and shortness of breath.   Cardiovascular: Negative for chest pain.  Gastrointestinal: Negative for constipation and diarrhea.  Genitourinary: Negative for dysuria, frequency and hematuria.  Musculoskeletal: Positive for arthralgias (chronic and at baseline). Negative for myalgias.  Skin: Negative for rash.  Neurological: Negative for headaches.  Hematological: Negative for adenopathy.  Psychiatric/Behavioral:       Reports mood is stable most of the time.  Feels more anxious than depressed      Objective:    Physical Exam  BP 110/64 (BP Location: Right Arm, Patient Position: Sitting, Cuff Size: Small)   Pulse 70   Temp 98.3 F (36.8 C) (Oral)   Resp 16   Ht 5\' 9"  (1.753 m)   Wt 215 lb (97.5 kg)   SpO2 100%   BMI 31.75 kg/m  Wt  Readings from Last 3 Encounters:  08/19/20 215 lb (97.5 kg)  06/25/20 228 lb 6.4 oz (103.6 kg)  04/25/20 231 lb (104.8 kg)     Health Maintenance Due  Topic Date Due  . Hepatitis C Screening  Never done  . HIV Screening  Never done  . TETANUS/TDAP  Never done  There are no preventive care reminders to display for this patient.  Lab Results  Component Value Date   TSH 0.92 02/25/2020   Lab Results  Component Value Date   WBC 7.4 07/14/2018   HGB 12.7 07/14/2018   HCT 40.2 07/14/2018   MCV 92.0 07/14/2018   PLT 282 07/14/2018   Lab Results  Component Value Date   NA 135 02/25/2020   K 4.5 02/25/2020   CO2 26 02/25/2020   GLUCOSE 81 02/25/2020   BUN 10 02/25/2020   CREATININE 0.77 02/25/2020   BILITOT 0.4 07/22/2017   ALKPHOS 56 11/19/2016   AST 14 07/22/2017   ALT 14 07/22/2017   PROT 6.4 07/22/2017   ALBUMIN 4.4 11/19/2016   CALCIUM 9.0 02/25/2020   ANIONGAP 8 07/14/2018   GFR 96.87 02/25/2020   No results found for: CHOL No results found for: HDL No results found for: LDLCALC No results found for: TRIG No results found for: CHOLHDL No results found for: HGBA1C    Physical Exam  Constitutional: She is oriented to person, place, and time. She appears well-developed and well-nourished. No distress.  HENT:  Head: Normocephalic and atraumatic.  Right Ear: Tympanic membrane and ear canal normal.  Left Ear: Tympanic membrane and ear canal normal.  Mouth/Throat: Not examined- pt wearing mask Eyes: Pupils are equal, round, and reactive to light. No scleral icterus.  Neck: Normal range of motion. No thyromegaly present.  Cardiovascular: Normal rate and regular rhythm.   No murmur heard. Pulmonary/Chest: Effort normal and breath sounds normal. No respiratory distress. He has no wheezes. She has no rales. She exhibits no tenderness.  Abdominal: Soft. Bowel sounds are normal. She exhibits no distension and no mass. There is no tenderness. There is no rebound and  no guarding.  Musculoskeletal: She exhibits no edema.  Lymphadenopathy:    She has no cervical adenopathy.  Neurological: She is alert and oriented to person, place, and time. She has normal patellar reflexes. She exhibits normal muscle tone. Coordination normal.  Skin: Skin is warm and dry.  Psychiatric: She has a normal mood and affect. Her behavior is normal. Judgment and thought content normal.  Breasts: Examined lying Right: Without masses, retractions, discharge or axillary adenopathy.  Left: Without masses, retractions, discharge or axillary adenopathy.  Pelvic: deferred          Assessment & Plan:    Assessment & Plan:   Problem List Items Addressed This Visit   None     No orders of the defined types were placed in this encounter.   Follow-up: No follow-ups on file.    Nance Pear, NP

## 2020-08-21 ENCOUNTER — Encounter: Payer: BC Managed Care – PPO | Admitting: Psychology

## 2020-09-10 ENCOUNTER — Encounter: Payer: 59 | Admitting: Neurology

## 2020-09-11 ENCOUNTER — Other Ambulatory Visit: Payer: Self-pay

## 2020-09-11 ENCOUNTER — Ambulatory Visit (HOSPITAL_BASED_OUTPATIENT_CLINIC_OR_DEPARTMENT_OTHER)
Admission: RE | Admit: 2020-09-11 | Discharge: 2020-09-11 | Disposition: A | Discharge status: Home / Self Care | Payer: BC Managed Care – PPO | Source: Ambulatory Visit | Attending: Family | Admitting: Family

## 2020-09-11 ENCOUNTER — Encounter (HOSPITAL_BASED_OUTPATIENT_CLINIC_OR_DEPARTMENT_OTHER): Payer: Self-pay

## 2020-09-11 DIAGNOSIS — Z1231 Encounter for screening mammogram for malignant neoplasm of breast: Secondary | ICD-10-CM | POA: Diagnosis not present

## 2020-09-11 DIAGNOSIS — Z0000 Encounter for general adult medical examination without abnormal findings: Secondary | ICD-10-CM

## 2020-10-07 DIAGNOSIS — Z20822 Contact with and (suspected) exposure to covid-19: Secondary | ICD-10-CM | POA: Diagnosis not present

## 2020-10-25 ENCOUNTER — Encounter: Payer: Self-pay | Admitting: Family

## 2020-10-28 DIAGNOSIS — M9903 Segmental and somatic dysfunction of lumbar region: Secondary | ICD-10-CM | POA: Diagnosis not present

## 2020-10-28 DIAGNOSIS — M542 Cervicalgia: Secondary | ICD-10-CM | POA: Diagnosis not present

## 2020-10-28 DIAGNOSIS — M9901 Segmental and somatic dysfunction of cervical region: Secondary | ICD-10-CM | POA: Diagnosis not present

## 2020-10-28 DIAGNOSIS — M5417 Radiculopathy, lumbosacral region: Secondary | ICD-10-CM | POA: Diagnosis not present

## 2020-10-28 MED ORDER — TRAZODONE HCL 100 MG PO TABS: 200.0000 mg | ORAL_TABLET | Freq: Every day | ORAL | 1 refills | Status: DC

## 2020-10-30 DIAGNOSIS — M542 Cervicalgia: Secondary | ICD-10-CM | POA: Diagnosis not present

## 2020-10-30 DIAGNOSIS — M5417 Radiculopathy, lumbosacral region: Secondary | ICD-10-CM | POA: Diagnosis not present

## 2020-10-30 DIAGNOSIS — M9901 Segmental and somatic dysfunction of cervical region: Secondary | ICD-10-CM | POA: Diagnosis not present

## 2020-10-30 DIAGNOSIS — M9903 Segmental and somatic dysfunction of lumbar region: Secondary | ICD-10-CM | POA: Diagnosis not present

## 2020-11-03 DIAGNOSIS — M5417 Radiculopathy, lumbosacral region: Secondary | ICD-10-CM | POA: Diagnosis not present

## 2020-11-03 DIAGNOSIS — M9903 Segmental and somatic dysfunction of lumbar region: Secondary | ICD-10-CM | POA: Diagnosis not present

## 2020-11-03 DIAGNOSIS — M9901 Segmental and somatic dysfunction of cervical region: Secondary | ICD-10-CM | POA: Diagnosis not present

## 2020-11-03 DIAGNOSIS — M542 Cervicalgia: Secondary | ICD-10-CM | POA: Diagnosis not present

## 2020-11-04 ENCOUNTER — Other Ambulatory Visit: Payer: Self-pay

## 2020-11-04 ENCOUNTER — Ambulatory Visit (INDEPENDENT_AMBULATORY_CARE_PROVIDER_SITE_OTHER): Payer: Self-pay | Admitting: Family

## 2020-11-04 VITALS — BP 126/66 | HR 84 | Temp 98.3°F | Ht 69.0 in | Wt 227.0 lb

## 2020-11-04 DIAGNOSIS — M25561 Pain in right knee: Secondary | ICD-10-CM | POA: Insufficient documentation

## 2020-11-04 DIAGNOSIS — M25562 Pain in left knee: Secondary | ICD-10-CM | POA: Insufficient documentation

## 2020-11-04 NOTE — Assessment & Plan Note (Addendum)
New. Will refer to orthopedics for further evaluation. I am concerned about a possible meniscal tear. Continue prn nsaids for short term prn use.

## 2020-11-04 NOTE — Progress Notes (Signed)
Subjective:    Patient ID: Kim Pham, female    DOB: 04/17/1981, 40 y.o.   MRN: [id number]  HPI  Patient is a 40 yr old female who presents today to discuss right sided knee pain. Reports that pain began 3 weeks ago. She was doing a squat with resistance bands and her dog who weighs 100lbs knocked her over. Initially she had pain in the right lateral thigh, but now she notes a pocked of fluid behind her right knee and some right lateral knee pain.  She has been using advil/aleve prn.  She put a cold compress yesterday which seemed to help. She had MRI of the right knee in 2016 which noted the following:   1. No acute findings or evidence of internal derangement. 2. Minimal patellar chondromalacia. 3. Mild distal quadriceps tendinosis.    Review of Systems See HPI  Past Medical History:  Diagnosis Date   Anxiety    Arthritis    Chiari malformation    Chronic headaches    Endometriosis    GERD (gastroesophageal reflux disease)    tums otc, prevacid prn   Headache    Insomnia 08/19/2020   Low blood pressure    pt states history of low blood pressure-fainted 12/2010-instructed to move slowly   SLE (systemic lupus erythematosus) ([place]) 03/26/2016     Social History   Socioeconomic History   Marital status: Married    Spouse name: Not on file   Number of children: 1   Years of education: Assoc   Highest education level: Not on file  Occupational History   Occupation: nurse  Tobacco Use   Smoking status: Former    Packs/day: 1.00    Years: 10.00    Pack years: 10.00    Types: Cigarettes    Quit date: 04/04/2004    Years since quitting: 16.5   Smokeless tobacco: Never  Vaping Use   Vaping Use: Never used  Substance and Sexual Activity   Alcohol use: Yes    Comment: occ.   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[employer]   Married   1 son- born 2004 has autism    Enjoys exercise, special olympics with son   Drinks 4-5 cups of coffee a day       Right handed   Social Determinants of Health   Financial Resource Strain: Not on file  Food Insecurity: Not on file  Transportation Needs: Not on file  Physical Activity: Not on file  Stress: Not on file  Social Connections: Not on file  Intimate Partner Violence: Not on file    Past Surgical History:  Procedure Laterality Date   ABDOMINAL HYSTERECTOMY  2007   CYSTOSCOPY  2007   DIAGNOSTIC LAPAROSCOPY  2005   [place]  2010   right shoulder   SALPINGOOPHORECTOMY  04/16/2011   Procedure: SALPINGO OOPHERECTOMY;  Surgeon: Lovenia Kim, MD;  Location: [facility];  Service: Gynecology;  Laterality: Right;   skull decompression surgery 2005      Family History  Problem Relation Age of Onset   Hypertension Mother    CAD Mother    Breast cancer Mother    Migraines Mother    Gout Mother    Heart disease Mother    Crohn's disease Mother    Hypertension Father    CAD Father  CVA Father    Heart disease Father    Sjogren's syndrome Sister    Celiac disease Sister    Rheum arthritis Maternal Grandmother    Autism Son    ADD / ADHD Son    Pancreatic cancer Maternal Grandfather    Breast cancer Maternal Aunt     Allergies  Allergen Reactions   Effexor [Venlafaxine] Nausea Only    insomnia    Current Outpatient Medications on File Prior to Visit  Medication Sig Dispense Refill   diclofenac (VOLTAREN) 75 MG EC tablet Take 1 tablet (75 mg total) by mouth 2 (two) times daily as needed. 20 tablet 0   esomeprazole (NEXIUM 24HR) 20 MG capsule Take 1 capsule (20 mg total) by mouth daily at 12 noon.     hydroxychloroquine (PLAQUENIL) 200 MG tablet TAKE ONE TABLET TWO TIMES A DAY MONDAY THROUGH FRIDAY. (Patient taking differently: Take 200 mg by mouth as directed. TAKE 200mg  TWO TIMES A DAY MONDAY THROUGH FRIDAY.) 40 tablet 0   Multiple Vitamins-Minerals (MULTIVITAMIN WITH MINERALS)  tablet Take by mouth daily.     phentermine 37.5 MG capsule Take 37.5 mg by mouth every morning.     SUMAtriptan (IMITREX) 100 MG tablet Take 1 tablet (100 mg total) by mouth at onset of migraine. May repeat in 2 hours if headache persists or recurs. Do not take more than 2 doses in 24 hours. 10 tablet 2   traZODone (DESYREL) 100 MG tablet Take 2 tablets (200 mg total) by mouth at bedtime. 180 tablet 1   No current facility-administered medications on file prior to visit.    BP 126/66   Pulse 84   Temp 98.3 F (36.8 C)   Ht 5\' 9"  (1.753 m)   Wt 227 lb (103 kg)   SpO2 95%   BMI 33.52 kg/m       Objective:   Physical Exam Constitutional:      Appearance: Normal appearance.  Musculoskeletal:     Comments: Mild swelling beneath right patella medially/laterally Prominent baker's cyst on right.  + pain with drawer test at base of patella, no linstability of knee noted  Neurological:     Mental Status: She is alert.          Assessment & Plan:

## 2020-11-06 ENCOUNTER — Encounter: Payer: Self-pay | Admitting: Orthopaedic Surgery

## 2020-11-06 ENCOUNTER — Encounter: Payer: Self-pay | Admitting: Family

## 2020-11-13 ENCOUNTER — Ambulatory Visit: Payer: Self-pay

## 2020-11-13 ENCOUNTER — Ambulatory Visit (INDEPENDENT_AMBULATORY_CARE_PROVIDER_SITE_OTHER): Payer: BC Managed Care – PPO | Admitting: Orthopaedic Surgery

## 2020-11-13 ENCOUNTER — Other Ambulatory Visit: Payer: Self-pay

## 2020-11-13 ENCOUNTER — Encounter: Payer: Self-pay | Admitting: Orthopaedic Surgery

## 2020-11-13 VITALS — Ht 69.0 in | Wt 210.0 lb

## 2020-11-13 DIAGNOSIS — G8929 Other chronic pain: Secondary | ICD-10-CM

## 2020-11-13 DIAGNOSIS — M25561 Pain in right knee: Secondary | ICD-10-CM

## 2020-11-13 HISTORY — DX: Pain in right knee: M25.561

## 2020-11-13 NOTE — Progress Notes (Signed)
Office Visit Note   Patient: Kim Pham           Date of Birth: 1980/09/01           MRN: [id number] Visit Date: 11/13/2020              Requested by: Debbrah Alar, NP [address] [place],  [place] [id number] PCP: Debbrah Alar, NP   Assessment & Plan: Visit Diagnoses:  1. Chronic pain of right knee   2. Acute pain of right knee     Plan: Kim Pham sustained an injury to her right leg about 3-1/2 weeks ago.  She was walking her dog and fell landing on her leg and her knee.  She had considerable ecchymosis which at this point has resolved but she is now having localized discomfort in her right knee.  She occasionally has a feeling of her knee giving way with pain mostly along the lateral compartment.  On occasion she is feeling a pop.  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  She does have pain along the lateral compartment and this certainly is a possibility of a lateral meniscal tear.  ACL and collateral ligaments appear to be intact.  She did not have an effusion today.  I think urine we will really know what is wrong it is an MRI scan.  I will order this and have her return afterwards  Follow-Up Instructions: Return After MRI scan right knee.   Orders:  Orders Placed This Encounter  Procedures   XR KNEE 3 VIEW RIGHT   MR Knee Right w/o contrast   No orders of the defined types were placed in this encounter.     Procedures: No procedures performed   Clinical Data: No additional findings.   Subjective: Chief Complaint  Patient presents with   Right Knee - Pain  Patient presents today for right knee pain. She has been having pain since falling while exercising over 3weeks ago. She said that she has swelling in her right knee. Her pain is located posteriorly, laterally, and below her knee. She said that her knee gives way while going down stairs. She has  difficulty extending her leg. She is taking Ibuprofen. 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   HPI  Review of Systems   Objective: Vital Signs: Ht '5\' 9"'$  (1.753 m)   Wt 210 lb (95.3 kg)   BMI 31.01 kg/m   Physical Exam Constitutional:      Appearance: She is well-developed.  Eyes:     Pupils: Pupils are equal, round, and reactive to light.  Pulmonary:     Effort: Pulmonary effort is normal.  Skin:    General: Skin is warm and dry.  Neurological:     Mental Status: She is alert and oriented to person, place, and time.  Psychiatric:        Behavior: Behavior normal.    Ortho Exam awake alert and oriented x3.  Comfortable sitting.  Right knee was not hot red warm or swollen.  It did not appear that she had an effusion.  There was no opening with varus valgus stress.  Negative anterior drawer sign and Lachman's.  He did have tenderness along the lateral compartment without popping or clicking.  Just a little bit of tenderness medially.  But she had full extension.  No popliteal mass.  No calf pain.  Neurologically intact.  No thigh or hip pain  Specialty Comments:  No specialty comments available.  Imaging: XR KNEE 3 VIEW RIGHT  Result Date: 11/13/2020 Films of the right knee are obtained in 3 projections standing.  I did not see any evidence of arthritis.  The joint spaces well-maintained.  No subchondral sclerosis or peripheral osteophytes.  Alignment was neutral.  No ectopic calcification or acute changes fracture.  Patella appears to track in the midline.  No effusion    PMFS History: Patient Active Problem List   Diagnosis Date Noted   Pain in right knee 11/13/2020   Acute pain of right knee 11/04/2020   Insomnia 08/19/2020   GERD (gastroesophageal reflux disease) 08/19/2020   Psychogenic nonepileptic seizure 07/27/2017   Chronic migraine without aura, with intractable migraine, so stated, with status migrainosus 07/27/2017   Diarrhea 07/08/2017    Nonepileptic episode ([place]) 12/22/2016   Sicca syndrome ([place]) 11/22/2016   Raynaud's disease without gangrene 11/22/2016   Hair loss 11/22/2016   Photosensitivity 11/22/2016   History of Chiari malformation 11/22/2016   High risk medication use 11/22/2016   Pain in joint, multiple sites 08/18/2016   Fatigue 03/26/2016   SLE (systemic lupus erythematosus) ([place]) 03/26/2016   Depression with anxiety 03/26/2016   Numbness and tingling 02/25/2016   Transient alteration of awareness 01/01/2016   Chiari malformation 12/19/2015   Past Medical History:  Diagnosis Date   Anxiety    Arthritis    Chiari malformation    Chronic headaches    Endometriosis    GERD (gastroesophageal reflux disease)    tums otc, prevacid prn   Headache    Insomnia 08/19/2020   Low blood pressure    pt states history of low blood pressure-fainted 12/2010-instructed to move slowly   SLE (systemic lupus erythematosus) ([place]) 03/26/2016    Family History  Problem Relation Age of Onset   Hypertension Mother    CAD Mother    Breast cancer Mother    Migraines Mother    Gout Mother    Heart disease Mother    Crohn's disease Mother    Hypertension Father    CAD Father    CVA Father    Heart disease Father    Sjogren's syndrome Sister    Celiac disease Sister    Rheum arthritis Maternal Grandmother    Autism Son    ADD / ADHD Son    Pancreatic cancer Maternal Grandfather    Breast cancer Maternal Aunt     Past Surgical History:  Procedure Laterality Date   ABDOMINAL HYSTERECTOMY  2007   CYSTOSCOPY  2007   DIAGNOSTIC LAPAROSCOPY  2005   ROTATOR CUFF REPAIR  2010   right shoulder   SALPINGOOPHORECTOMY  04/16/2011   Procedure: SALPINGO OOPHERECTOMY;  Surgeon: Lovenia Kim, MD;  Location: [facility];  Service: Gynecology;  Laterality: Right;   skull decompression surgery 2005     Social History   Occupational History   Occupation: nurse  Tobacco Use   Smoking status: Former    Packs/day: 1.00     Years: 10.00    Pack years: 10.00    Types: Cigarettes    Quit date: 04/04/2004    Years since quitting: 16.6   Smokeless tobacco: Never  Vaping Use   Vaping Use: Never used  Substance and Sexual Activity   Alcohol use: Yes    Comment: occ.   Drug use: No   Sexual activity: Not on file

## 2020-11-17 ENCOUNTER — Telehealth: Payer: Self-pay | Admitting: Orthopaedic Surgery

## 2020-11-17 NOTE — Telephone Encounter (Signed)
Pt said someone called and left a message if [facility] would be okay to send her MRI referral to and she just wanted to verify that it was okay to send the referral there. The best call back number is [phone].

## 2020-11-18 ENCOUNTER — Ambulatory Visit: Payer: BC Managed Care – PPO | Admitting: Orthopaedic Surgery

## 2020-11-18 NOTE — Telephone Encounter (Signed)
noted 

## 2020-11-20 ENCOUNTER — Encounter: Payer: Self-pay | Admitting: Orthopaedic Surgery

## 2020-11-22 ENCOUNTER — Ambulatory Visit (HOSPITAL_BASED_OUTPATIENT_CLINIC_OR_DEPARTMENT_OTHER)
Admission: RE | Admit: 2020-11-22 | Discharge: 2020-11-22 | Disposition: A | Discharge status: Home / Self Care | Payer: BC Managed Care – PPO | Source: Ambulatory Visit | Attending: Orthopaedic Surgery | Admitting: Orthopaedic Surgery

## 2020-11-22 ENCOUNTER — Other Ambulatory Visit: Payer: Self-pay

## 2020-11-22 DIAGNOSIS — M25561 Pain in right knee: Secondary | ICD-10-CM | POA: Insufficient documentation

## 2020-11-22 DIAGNOSIS — G8929 Other chronic pain: Secondary | ICD-10-CM | POA: Diagnosis not present

## 2020-11-25 ENCOUNTER — Encounter: Payer: Self-pay | Admitting: Orthopaedic Surgery

## 2020-11-25 ENCOUNTER — Ambulatory Visit (INDEPENDENT_AMBULATORY_CARE_PROVIDER_SITE_OTHER): Payer: BC Managed Care – PPO | Admitting: Orthopaedic Surgery

## 2020-11-25 ENCOUNTER — Other Ambulatory Visit: Payer: Self-pay

## 2020-11-25 VITALS — Ht 69.0 in | Wt 210.0 lb

## 2020-11-25 DIAGNOSIS — G8929 Other chronic pain: Secondary | ICD-10-CM

## 2020-11-25 DIAGNOSIS — M25561 Pain in right knee: Secondary | ICD-10-CM

## 2020-11-25 NOTE — Progress Notes (Signed)
Office Visit Note   Patient: Kim Pham           Date of Birth: [birth year]           MRN: [id number] Visit Date: 11/25/2020              Requested by: Debbrah Alar, NP [address] [place],  [place] [address] PCP: Debbrah Alar, NP   Assessment & Plan: Visit Diagnoses:  1. Chronic pain of right knee     Plan: Mrs. Rueb had an MRI scan of her right knee demonstrating an intact medial and lateral meniscus as well as ACL and PCL.  MCL and LCL were also intact.  There was minimal cartilage fissuring over the lateral patella facet but progressive fissuring over the inferior trochlear groove with a no full-thickness cartilage defect.  No cartilage defect medially.  Similar cartilage signal over the central lateral tibial plateau compared to the 2016 knee arthroscopy.  There was a moderate joint effusion.  No Baker's cyst.  Normal Hoffa's fat pad.  Mild distal quadriceps and proximal patellar tendinosis.  Lateral patellar retinaculum was intact as well as the MPFL.  Long discussion regarding the findings and treatment options.  My choice was to consider a course of physical therapy but after much discussion she would prefer to have an arthroscopic evaluation and even consider debriding the area of cartilage loss in the trochlear groove.  She is just having a lot of mechanical symptoms with popping and clicking and catching and trouble with inclines stairs and steps.  She did have an injury over a month ago as previously outlined that aggravated her knee.  She is perfectly aware that this may or may not make much of a difference but she would like to proceed.  Discussed the surgery with the 2 incisions, outpatient surgery, general anesthetic and the need to take time out of work postoperatively.  She will need postop PT  Follow-Up Instructions: Return We will schedule right knee arthroscopy.   Orders:  No orders of the defined types were placed in this  encounter.  No orders of the defined types were placed in this encounter.     Procedures: No procedures performed   Clinical Data: No additional findings.   Subjective: Chief Complaint  Patient presents with   Right Knee - Follow-up    MRI review  Patient presents today for follow up on her right knee. She had an MRI and is here today for those results.  No change in symptoms  HPI  Review of Systems   Objective: Vital Signs: Ht '5\' 9"'$  (1.753 m)   Wt 210 lb (95.3 kg)   BMI 31.01 kg/m   Physical Exam Constitutional:      Appearance: She is well-developed.  Eyes:     Pupils: Pupils are equal, round, and reactive to light.  Pulmonary:     Effort: Pulmonary effort is normal.  Skin:    General: Skin is warm and dry.  Neurological:     Mental Status: She is alert and oriented to person, place, and time.  Psychiatric:        Behavior: Behavior normal.    Ortho Exam right knee with some patella crepitation and some discomfort with patella compression.  Still has a little bit of discomfort along the lateral compartment.  No instability.  Full extension of flexed over 100 degrees.  I thought she may have a small effusion.  No popliteal cyst.  No calf pain or distal edema.  Specialty Comments:  No specialty comments available.  Imaging: No results found.   PMFS History: Patient Active Problem List   Diagnosis Date Noted   Pain in right knee 11/13/2020   Acute pain of right knee 11/04/2020   Insomnia 08/19/2020   GERD (gastroesophageal reflux disease) 08/19/2020   Psychogenic nonepileptic seizure 07/27/2017   Chronic migraine without aura, with intractable migraine, so stated, with status migrainosus 07/27/2017   Diarrhea 07/08/2017   Nonepileptic episode ([place]) 12/22/2016   Sicca syndrome ([place]) 11/22/2016   Raynaud's disease without gangrene 11/22/2016   Hair loss 11/22/2016   Photosensitivity 11/22/2016   History of Chiari malformation 11/22/2016   High risk  medication use 11/22/2016   Pain in joint, multiple sites 08/18/2016   Fatigue 03/26/2016   SLE (systemic lupus erythematosus) ([place]) 03/26/2016   Depression with anxiety 03/26/2016   Numbness and tingling 02/25/2016   Transient alteration of awareness 01/01/2016   Chiari malformation 12/19/2015   Past Medical History:  Diagnosis Date   Anxiety    Arthritis    Chiari malformation    Chronic headaches    Endometriosis    GERD (gastroesophageal reflux disease)    tums otc, prevacid prn   Headache    Insomnia 08/19/2020   Low blood pressure    pt states history of low blood pressure-fainted 12/2010-instructed to move slowly   SLE (systemic lupus erythematosus) ([place]) 03/26/2016    Family History  Problem Relation Age of Onset   Hypertension Mother    CAD Mother    Breast cancer Mother    Migraines Mother    Gout Mother    Heart disease Mother    Crohn's disease Mother    Hypertension Father    CAD Father    CVA Father    Heart disease Father    Sjogren's syndrome Sister    Celiac disease Sister    Rheum arthritis Maternal Grandmother    Autism Son    ADD / ADHD Son    Pancreatic cancer Maternal Grandfather    Breast cancer Maternal Aunt     Past Surgical History:  Procedure Laterality Date   ABDOMINAL HYSTERECTOMY  2007   CYSTOSCOPY  2007   DIAGNOSTIC LAPAROSCOPY  2005   ROTATOR CUFF REPAIR  2010   right shoulder   SALPINGOOPHORECTOMY  04/16/2011   Procedure: SALPINGO OOPHERECTOMY;  Surgeon: Lovenia Kim, MD;  Location: [facility];  Service: Gynecology;  Laterality: Right;   skull decompression surgery 2005     Social History   Occupational History   Occupation: nurse  Tobacco Use   Smoking status: Former    Packs/day: 1.00    Years: 10.00    Pack years: 10.00    Types: Cigarettes    Quit date: 04/04/2004    Years since quitting: 16.6   Smokeless tobacco: Never  Vaping Use   Vaping Use: Never used  Substance and Sexual Activity   Alcohol use: Yes     Comment: occ.   Drug use: No   Sexual activity: Not on file

## 2020-11-27 ENCOUNTER — Encounter: Payer: Self-pay | Admitting: Orthopaedic Surgery

## 2020-11-28 ENCOUNTER — Other Ambulatory Visit: Payer: Self-pay

## 2020-12-04 ENCOUNTER — Encounter: Payer: Self-pay | Admitting: Orthopaedic Surgery

## 2020-12-04 ENCOUNTER — Other Ambulatory Visit: Payer: Self-pay | Admitting: Orthopaedic Surgery

## 2020-12-04 DIAGNOSIS — G8918 Other acute postprocedural pain: Secondary | ICD-10-CM | POA: Diagnosis not present

## 2020-12-04 DIAGNOSIS — M6751 Plica syndrome, right knee: Secondary | ICD-10-CM | POA: Diagnosis not present

## 2020-12-04 DIAGNOSIS — M23331 Other meniscus derangements, other medial meniscus, right knee: Secondary | ICD-10-CM | POA: Diagnosis not present

## 2020-12-04 DIAGNOSIS — M2241 Chondromalacia patellae, right knee: Secondary | ICD-10-CM | POA: Diagnosis not present

## 2020-12-04 DIAGNOSIS — M948X6 Other specified disorders of cartilage, lower leg: Secondary | ICD-10-CM | POA: Diagnosis not present

## 2020-12-04 DIAGNOSIS — M659 Synovitis and tenosynovitis, unspecified: Secondary | ICD-10-CM | POA: Diagnosis not present

## 2020-12-04 MED ORDER — OXYCODONE-ACETAMINOPHEN 5-325 MG PO TABS: 1.0000 | ORAL_TABLET | ORAL | 0 refills | Status: DC | PRN

## 2020-12-10 ENCOUNTER — Encounter: Payer: Self-pay | Admitting: Orthopaedic Surgery

## 2020-12-10 ENCOUNTER — Telehealth: Payer: Self-pay

## 2020-12-10 NOTE — Telephone Encounter (Signed)
Pt called in wanting to know if she can come in tomorrow vs next Tuesday for her appt please advise

## 2020-12-10 NOTE — Telephone Encounter (Signed)
Appt made for tomorrow. Okay per [employer]

## 2020-12-11 ENCOUNTER — Encounter: Payer: Self-pay | Admitting: Orthopaedic Surgery

## 2020-12-11 ENCOUNTER — Other Ambulatory Visit: Payer: Self-pay

## 2020-12-11 ENCOUNTER — Ambulatory Visit (INDEPENDENT_AMBULATORY_CARE_PROVIDER_SITE_OTHER): Payer: BC Managed Care – PPO | Admitting: Orthopaedic Surgery

## 2020-12-11 DIAGNOSIS — G8929 Other chronic pain: Secondary | ICD-10-CM

## 2020-12-11 DIAGNOSIS — M25561 Pain in right knee: Secondary | ICD-10-CM

## 2020-12-11 NOTE — Progress Notes (Signed)
Office Visit Note   Patient: Kim Pham           Date of Birth: 20-Nov-1980           MRN: [id number] Visit Date: 12/11/2020              Requested by: Debbrah Alar, NP [address] [place],  [place] [address] PCP: Debbrah Alar, NP   Assessment & Plan: Visit Diagnoses:  1. Acute pain of right knee   2. Chronic pain of right knee     Plan: 1 week status post right knee arthroscopy.  I debrided a medial shelf plica that was somewhat thick and a potential cause of her anterior knee pain.  There was some grade 2 change of chondromalacia in the patella more lateral than medial but considerable chondromalacia within the trochlear.  There were loose areas of articular cartilage and what appeared to be some grade 3 changes.  These were debrided.  There were a number of small pieces of articular cartilage within the effusion. I did not do a microfracture as I thought there was still a fair amount of cartilage remaining.  No postop complications.  No calf pain or distal edema no evidence of infection.  Will let her continue activities as tolerated and using ice or heat and return to see me in a week.  Okay to work remotely from home at this point  Follow-Up Instructions: Return in about 1 week (around 12/18/2020).   Orders:  No orders of the defined types were placed in this encounter.  No orders of the defined types were placed in this encounter.     Procedures: No procedures performed   Clinical Data: No additional findings.   Subjective: Chief Complaint  Patient presents with   Right Knee - Routine Post Op  1 week status post right knee arthroscopy with excision of medial shelf thickened plica and chondromalacia of the patella.  There were some small areas of articular cartilage loss in the patella but more involvement in the trochlear.  There was some grade 3 changes.  Very minimal effusion.  Full extension of flexed over 105 degrees.  Arthroscopic  portals are healing without evidence of infection.  No thigh or calf pain or distal edema.  HPI  Review of Systems   Objective: Vital Signs: There were no vitals taken for this visit.  Physical Exam  Ortho Exam  Specialty Comments:  No specialty comments available.  Imaging: No results found.   PMFS History: Patient Active Problem List   Diagnosis Date Noted   Pain in right knee 11/13/2020   Acute pain of right knee 11/04/2020   Insomnia 08/19/2020   GERD (gastroesophageal reflux disease) 08/19/2020   Psychogenic nonepileptic seizure 07/27/2017   Chronic migraine without aura, with intractable migraine, so stated, with status migrainosus 07/27/2017   Diarrhea 07/08/2017   Nonepileptic episode ([place]) 12/22/2016   Sicca syndrome ([place]) 11/22/2016   Raynaud's disease without gangrene 11/22/2016   Hair loss 11/22/2016   Photosensitivity 11/22/2016   History of Chiari malformation 11/22/2016   High risk medication use 11/22/2016   Pain in joint, multiple sites 08/18/2016   Fatigue 03/26/2016   SLE (systemic lupus erythematosus) ([place]) 03/26/2016   Depression with anxiety 03/26/2016   Numbness and tingling 02/25/2016   Transient alteration of awareness 01/01/2016   Chiari malformation 12/19/2015   Past Medical History:  Diagnosis Date   Anxiety    Arthritis    Chiari  malformation    Chronic headaches    Endometriosis    GERD (gastroesophageal reflux disease)    tums otc, prevacid prn   Headache    Insomnia 08/19/2020   Low blood pressure    pt states history of low blood pressure-fainted 12/2010-instructed to move slowly   SLE (systemic lupus erythematosus) ([place]) 03/26/2016    Family History  Problem Relation Age of Onset   Hypertension Mother    CAD Mother    Breast cancer Mother    Migraines Mother    Gout Mother    Heart disease Mother    Crohn's disease Mother    Hypertension Father    CAD Father    CVA Father    Heart disease Father    Sjogren's  syndrome Sister    Celiac disease Sister    Rheum arthritis Maternal Grandmother    Autism Son    ADD / ADHD Son    Pancreatic cancer Maternal Grandfather    Breast cancer Maternal Aunt     Past Surgical History:  Procedure Laterality Date   ABDOMINAL HYSTERECTOMY  2007   CYSTOSCOPY  2007   DIAGNOSTIC LAPAROSCOPY  2005   ROTATOR CUFF REPAIR  2010   right shoulder   SALPINGOOPHORECTOMY  04/16/2011   Procedure: SALPINGO OOPHERECTOMY;  Surgeon: Lovenia Kim, MD;  Location: [facility];  Service: Gynecology;  Laterality: Right;   skull decompression surgery 2005     Social History   Occupational History   Occupation: nurse  Tobacco Use   Smoking status: Former    Packs/day: 1.00    Years: 10.00    Pack years: 10.00    Types: Cigarettes    Quit date: 04/04/2004    Years since quitting: 16.6   Smokeless tobacco: Never  Vaping Use   Vaping Use: Never used  Substance and Sexual Activity   Alcohol use: Yes    Comment: occ.   Drug use: No   Sexual activity: Not on file     Garald Balding, MD   Note - This record has been created using [employer].  Chart creation errors have been sought, but may not always  have been located. Such creation errors do not reflect on  the standard of medical care.

## 2020-12-15 ENCOUNTER — Encounter: Payer: BC Managed Care – PPO | Admitting: Orthopedic Surgery

## 2020-12-16 ENCOUNTER — Encounter: Payer: BC Managed Care – PPO | Admitting: Orthopaedic Surgery

## 2020-12-17 ENCOUNTER — Encounter: Payer: Self-pay | Admitting: Orthopaedic Surgery

## 2020-12-18 ENCOUNTER — Other Ambulatory Visit: Payer: Self-pay

## 2020-12-18 ENCOUNTER — Encounter: Payer: Self-pay | Admitting: Orthopaedic Surgery

## 2020-12-18 ENCOUNTER — Ambulatory Visit (INDEPENDENT_AMBULATORY_CARE_PROVIDER_SITE_OTHER): Payer: BC Managed Care – PPO | Admitting: Orthopaedic Surgery

## 2020-12-18 DIAGNOSIS — M25561 Pain in right knee: Secondary | ICD-10-CM

## 2020-12-18 DIAGNOSIS — G8929 Other chronic pain: Secondary | ICD-10-CM

## 2020-12-18 NOTE — Progress Notes (Signed)
Office Visit Note   Patient: Kim Pham           Date of Birth: 02-08-1981           MRN: [id number] Visit Date: 12/18/2020              Requested by: Debbrah Alar, NP [address] [place],  [place] [address] PCP: Debbrah Alar, NP   Assessment & Plan: Visit Diagnoses:  1. Chronic pain of right knee     Plan: 2-week status post right knee arthroscopy.  I excised a medial shelf plica and debrided chondromalacia of the patella and the trochlear these the only 2 areas that were abnormal that could have been accounting for the anterior knee pain.  She is doing well she still a bit stiff and sore but feels like that arthroscopy is made a difference.  I given her a note saying that she can work from home for the next 2 weeks and we will reevaluate her at that point.  We have also discussed some exercises  Follow-Up Instructions: Return in about 2 weeks (around 01/01/2021).   Orders:  No orders of the defined types were placed in this encounter.  No orders of the defined types were placed in this encounter.     Procedures: No procedures performed   Clinical Data: No additional findings.   Subjective: Chief Complaint  Patient presents with   Right Knee - Follow-up    Right knee arthroscopy 12/04/2020  Patient presents today for follow up on her right knee. She is now two weeks out from a right knee arthroscopy. Her surgery was on 12/04/2020. She states that she is improving.   HPI  Review of Systems   Objective: Vital Signs: There were no vitals taken for this visit.  Physical Exam  Ortho Exam right knee was not hot red warm or swollen.  No use of ambulatory aid.  Full quick extension of flexed over 105 degrees.  Just minimal pain in the peripatellar region.  Arthroscopic portals of healed nicely.  No popliteal pain.  No calf pain  Specialty Comments:  No specialty comments available.  Imaging: No results found.   PMFS  History: Patient Active Problem List   Diagnosis Date Noted   Pain in right knee 11/13/2020   Acute pain of right knee 11/04/2020   Insomnia 08/19/2020   GERD (gastroesophageal reflux disease) 08/19/2020   Psychogenic nonepileptic seizure 07/27/2017   Chronic migraine without aura, with intractable migraine, so stated, with status migrainosus 07/27/2017   Diarrhea 07/08/2017   Nonepileptic episode ([place]) 12/22/2016   Sicca syndrome ([place]) 11/22/2016   Raynaud's disease without gangrene 11/22/2016   Hair loss 11/22/2016   Photosensitivity 11/22/2016   History of Chiari malformation 11/22/2016   High risk medication use 11/22/2016   Pain in joint, multiple sites 08/18/2016   Fatigue 03/26/2016   SLE (systemic lupus erythematosus) ([place]) 03/26/2016   Depression with anxiety 03/26/2016   Numbness and tingling 02/25/2016   Transient alteration of awareness 01/01/2016   Chiari malformation 12/19/2015   Past Medical History:  Diagnosis Date   Anxiety    Arthritis    Chiari malformation    Chronic headaches    Endometriosis    GERD (gastroesophageal reflux disease)    tums otc, prevacid prn   Headache    Insomnia 08/19/2020   Low blood pressure    pt states history of low blood pressure-fainted 12/2010-instructed to move slowly  SLE (systemic lupus erythematosus) ([place]) 03/26/2016    Family History  Problem Relation Age of Onset   Hypertension Mother    CAD Mother    Breast cancer Mother    Migraines Mother    Gout Mother    Heart disease Mother    Crohn's disease Mother    Hypertension Father    CAD Father    CVA Father    Heart disease Father    Sjogren's syndrome Sister    Celiac disease Sister    Rheum arthritis Maternal Grandmother    Autism Son    ADD / ADHD Son    Pancreatic cancer Maternal Grandfather    Breast cancer Maternal Aunt     Past Surgical History:  Procedure Laterality Date   ABDOMINAL HYSTERECTOMY  2007   CYSTOSCOPY  2007   DIAGNOSTIC LAPAROSCOPY   2005   ROTATOR CUFF REPAIR  2010   right shoulder   SALPINGOOPHORECTOMY  04/16/2011   Procedure: SALPINGO OOPHERECTOMY;  Surgeon: Lovenia Kim, MD;  Location: [facility];  Service: Gynecology;  Laterality: Right;   skull decompression surgery 2005     Social History   Occupational History   Occupation: nurse  Tobacco Use   Smoking status: Former    Packs/day: 1.00    Years: 10.00    Pack years: 10.00    Types: Cigarettes    Quit date: 04/04/2004    Years since quitting: 16.7   Smokeless tobacco: Never  Vaping Use   Vaping Use: Never used  Substance and Sexual Activity   Alcohol use: Yes    Comment: occ.   Drug use: No   Sexual activity: Not on file

## 2020-12-30 NOTE — Progress Notes (Deleted)
NEUROLOGY FOLLOW UP OFFICE NOTE  Kim Pham [id number]  Assessment/Plan:   ***  Subjective:  Kim Pham is a 40 year old right-handed female with SLE, migraines and history of Chiari malformation s/p decompression who follows up for multiple symptomatology including fasciculations, myoclonus and cognitive deficits.  UPDATE: B12 from March was 238.  Advised to start OTC 1057mg daily.  MRI of thoracic spine with and without contrast on 07/15/2020 showed right paracentral disc protrusion at T8-T9 with mild spinal stenosis and subtle cord mass effect but no abnormal signal changes.  MRI of brain and cervical spine with and without contrast on 07/27/2020 showed suboccipital craniectomy without recurrence of Chiari malformation as well as stable (compared to imaging from 2005) mild nonspecific white matter changes and no evidence of spinal cord abnormalities.  NCV-EMG was ordered but ***.  She had right knee pain and was seen by orthopedics and underwent arthroscopy in August.   She was referred for neuropsychological evaluation but ***   HISTORY: In late 2021, she began experiencing fasciculations involving her extremities.  It feels like popcorn popping under her skin.  Sometimes she can see the rippling on her arm or leg.  She started having body jerks while laying in bed at night when she is relaxed and may be close to sleep.  She then started experiencing jerks during the day.  If she is typing, her hand may jerk.  She reports feeling off-balance and clumsy.  She started having short term memory problems and started needing to write things down at work.  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  She also reports both intermittent word-finding difficulty and stuttering speech.  She had previously had COVID twice in 2020, the last case a year prior to onset of these symptoms.   In 2005, she began having syncopal spells and headaches.  She also described episodes of  "zoning out" in which she loses awareness and exhibits twitching of the left upper and lower extremities for several minutes at a time.  MRI of brain without contrast in 2005 showed cerebellar ectopia approximately 6 mm below foramen magnum but no acute intracranial abnormality.  She subsequently underwent Chiari decompression.  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  She had an extensive neurologic workup in 2017 and 2018.    Routine EEG in September 2017 showed left temporal slowing.  Follow up prolonged sleep deprived EEG the following month was normal.  MRI of brain with and without contrast showed prior suboccipital decompression but otherwise unremarkable.  [facility] 2 habitual events of unresponsiveness but without the extremity jerking, which did not exhibit electrographic correlate, consistent with nonepileptic events but unclear if they may be migrainous as she wakes up afterwards with a headache.    PAST MEDICAL HISTORY: Past Medical History:  Diagnosis Date   Anxiety    Arthritis    Chiari malformation    Chronic headaches    Endometriosis    GERD (gastroesophageal reflux disease)    tums otc, prevacid prn   Headache    Insomnia 08/19/2020   Low blood pressure    pt states history of low blood pressure-fainted 12/2010-instructed to move slowly   [place]LE [place]mat[place] 03/26/2016    MEDICATIONS: Current Outpatient Medications on File Prior to Visit  Medication Sig Dispense Refill   diclofenac (VOLTAREN) 75  MG EC tablet Take 1 tablet (75 mg total) by mouth 2 (two) times daily as needed. 20 tablet 0   esomeprazole (NEXIUM 24HR) 20 MG capsule Take 1 capsule (20 mg total) by mouth daily at 12 noon.     hydroxychloroquine (PLAQUENIL) 200 MG tablet TAKE ONE TABLET TWO TIMES A DAY MONDAY THROUGH FRIDAY. (Patient taking differently: Take 200 mg by mouth as directed. TAKE '200mg'$  TWO TIMES  A DAY MONDAY THROUGH FRIDAY.) 40 tablet 0   Multiple Vitamins-Minerals (MULTIVITAMIN WITH MINERALS) tablet Take by mouth daily.     oxyCODONE-acetaminophen (PERCOCET/ROXICET) 5-325 MG tablet Take 1-2 tablets by mouth every 4 (four) hours as needed for severe pain. 30 tablet 0   phentermine 37.5 MG capsule Take 37.5 mg by mouth every morning.     SUMAtriptan (IMITREX) 100 MG tablet Take 1 tablet (100 mg total) by mouth at onset of migraine. May repeat in 2 hours if headache persists or recurs. Do not take more than 2 doses in 24 hours. 10 tablet 2   traZODone (DESYREL) 100 MG tablet Take 2 tablets (200 mg total) by mouth at bedtime. 180 tablet 1   No current facility-administered medications on file prior to visit.    ALLERGIES: Allergies  Allergen Reactions   Effexor [Venlafaxine] Nausea Only    insomnia    FAMILY HISTORY: Family History  Problem Relation Age of Onset   Hypertension Mother    CAD Mother    Breast cancer Mother    Migraines Mother    Gout Mother    Heart disease Mother    Crohn's disease Mother    Hypertension Father    CAD Father    CVA Father    Heart disease Father    Sjogren's syndrome Sister    Celiac disease Sister    Rheum arthritis Maternal Grandmother    Autism Son    ADD / ADHD Son    Pancreatic cancer Maternal Grandfather    Breast cancer Maternal Aunt       Objective:  *** General: No acute distress.  Patient appears ***-groomed.   Head:  Normocephalic/atraumatic Eyes:  Fundi examined but not visualized Neck: supple, no paraspinal tenderness, full range of motion Heart:  Regular rate and rhythm Lungs:  Clear to auscultation bilaterally Back: No paraspinal tenderness Neurological Exam: alert and oriented to person, place, and time.  Speech fluent and not dysarthric, language intact.  CN II-XII intact. Bulk and tone normal, muscle strength 5/5 throughout.  Sensation to light touch intact.  Deep tendon reflexes 2+ throughout, toes downgoing.   Finger to nose testing intact.  Gait normal, Romberg negative.   Metta Clines, DO  CC: ***

## 2020-12-31 ENCOUNTER — Ambulatory Visit: Payer: BC Managed Care – PPO | Admitting: Neurology

## 2021-01-01 ENCOUNTER — Ambulatory Visit (INDEPENDENT_AMBULATORY_CARE_PROVIDER_SITE_OTHER): Payer: BC Managed Care – PPO | Admitting: Orthopaedic Surgery

## 2021-01-01 ENCOUNTER — Encounter: Payer: Self-pay | Admitting: Orthopaedic Surgery

## 2021-01-01 ENCOUNTER — Other Ambulatory Visit: Payer: Self-pay

## 2021-01-01 DIAGNOSIS — M25561 Pain in right knee: Secondary | ICD-10-CM

## 2021-01-01 NOTE — Progress Notes (Signed)
Office Visit Note   Patient: Kim Pham           Date of Birth: October 05, 1980           MRN: [id number] Visit Date: 01/01/2021              Requested by: Debbrah Alar, NP [address] [place],  [place] [id number] PCP: Debbrah Alar, NP   Assessment & Plan: Visit Diagnoses:  1. Acute pain of right knee     Plan: Kim Pham is a month status post right knee arthroscopy.  I performed a debridement of the patellofemoral joint for loose articular cartilage and excised a medial shelf plica.  She notes that she is doing very well and does not have any the preoperative pain.  We have discussed exercises and activity as tolerated.  She would like a note returning to work on September 19 without limitations  Follow-Up Instructions: Return if symptoms worsen or fail to improve.   Orders:  No orders of the defined types were placed in this encounter.  No orders of the defined types were placed in this encounter.     Procedures: No procedures performed   Clinical Data: No additional findings.   Subjective: Chief Complaint  Patient presents with   Right Knee - Follow-up    Right knee arthroscopy 12/04/2020  Patient presents today for follow up on her right knee. She had a right knee arthroscopy on 12/04/2020. She is now a month out from surgery. She is doing well and has no complaints.  HPI  Review of Systems   Objective: Vital Signs: There were no vitals taken for this visit.  Physical Exam  Ortho Exam right knee was not hot red warm or swollen.  No effusion.  No localized areas of tenderness.  Arthroscopic portals of healed without problem.  No longer having any pain about the patella nor with patella compression.  Specialty Comments:  No specialty comments available.  Imaging: No results found.   PMFS History: Patient Active Problem List   Diagnosis Date Noted   Pain in right knee 11/13/2020   Acute pain of right knee 11/04/2020    Insomnia 08/19/2020   GERD (gastroesophageal reflux disease) 08/19/2020   Psychogenic nonepileptic seizure 07/27/2017   Chronic migraine without aura, with intractable migraine, so stated, with status migrainosus 07/27/2017   Diarrhea 07/08/2017   Nonepileptic episode ([place]) 12/22/2016   Sicca syndrome ([place]) 11/22/2016   Raynaud's disease without gangrene 11/22/2016   Hair loss 11/22/2016   Photosensitivity 11/22/2016   History of Chiari malformation 11/22/2016   High risk medication use 11/22/2016   Pain in joint, multiple sites 08/18/2016   Fatigue 03/26/2016   SLE (systemic lupus erythematosus) ([place]) 03/26/2016   Depression with anxiety 03/26/2016   Numbness and tingling 02/25/2016   Transient alteration of awareness 01/01/2016   Chiari malformation 12/19/2015   Past Medical History:  Diagnosis Date   Anxiety    Arthritis    Chiari malformation    Chronic headaches    Endometriosis    GERD (gastroesophageal reflux disease)    tums otc, prevacid prn   Headache    Insomnia 08/19/2020   Low blood pressure    pt states history of low blood pressure-fainted 12/2010-instructed to move slowly   SLE (systemic lupus erythematosus) ([place]) 03/26/2016    Family History  Problem Relation Age of Onset   Hypertension Mother    CAD Mother    Breast cancer  Mother    Migraines Mother    Gout Mother    Heart disease Mother    Crohn's disease Mother    Hypertension Father    CAD Father    CVA Father    Heart disease Father    Sjogren's syndrome Sister    Celiac disease Sister    Rheum arthritis Maternal Grandmother    Autism Son    ADD / ADHD Son    Pancreatic cancer Maternal Grandfather    Breast cancer Maternal Aunt     Past Surgical History:  Procedure Laterality Date   ABDOMINAL HYSTERECTOMY  2007   CYSTOSCOPY  2007   DIAGNOSTIC LAPAROSCOPY  2005   ROTATOR CUFF REPAIR  2010   right shoulder   SALPINGOOPHORECTOMY  04/16/2011   Procedure: SALPINGO OOPHERECTOMY;  Surgeon:  Lovenia Kim, MD;  Location: [facility];  Service: Gynecology;  Laterality: Right;   skull decompression surgery 2005     Social History   Occupational History   Occupation: nurse  Tobacco Use   Smoking status: Former    Packs/day: 1.00    Years: 10.00    Pack years: 10.00    Types: Cigarettes    Quit date: 04/04/2004    Years since quitting: 16.7   Smokeless tobacco: Never  Vaping Use   Vaping Use: Never used  Substance and Sexual Activity   Alcohol use: Yes    Comment: occ.   Drug use: No   Sexual activity: Not on file

## 2021-01-15 ENCOUNTER — Encounter: Payer: Self-pay | Admitting: Family

## 2021-01-18 MED ORDER — HYDROXYZINE PAMOATE 25 MG PO CAPS: 25.0000 mg | ORAL_CAPSULE | Freq: Three times a day (TID) | ORAL | 0 refills | Status: DC | PRN

## 2021-01-18 NOTE — Addendum Note (Signed)
Addended by: Debbrah Alar on: 01/18/2021 11:20 AM   Modules accepted: Orders

## 2021-02-17 DIAGNOSIS — U071 COVID-19: Secondary | ICD-10-CM

## 2021-02-17 HISTORY — DX: COVID-19: U07.1

## 2021-02-20 ENCOUNTER — Emergency Department (HOSPITAL_BASED_OUTPATIENT_CLINIC_OR_DEPARTMENT_OTHER): Payer: BC Managed Care – PPO

## 2021-02-20 ENCOUNTER — Encounter (HOSPITAL_BASED_OUTPATIENT_CLINIC_OR_DEPARTMENT_OTHER): Payer: Self-pay | Admitting: *Deleted

## 2021-02-20 ENCOUNTER — Emergency Department (HOSPITAL_BASED_OUTPATIENT_CLINIC_OR_DEPARTMENT_OTHER)
Admission: EM | Admit: 2021-02-20 | Discharge: 2021-02-20 | Disposition: A | Discharge status: Home / Self Care | Payer: BC Managed Care – PPO | Attending: Emergency Medicine | Admitting: Emergency Medicine

## 2021-02-20 ENCOUNTER — Other Ambulatory Visit: Payer: Self-pay

## 2021-02-20 DIAGNOSIS — Z87891 Personal history of nicotine dependence: Secondary | ICD-10-CM | POA: Diagnosis not present

## 2021-02-20 DIAGNOSIS — G43009 Migraine without aura, not intractable, without status migrainosus: Secondary | ICD-10-CM | POA: Insufficient documentation

## 2021-02-20 DIAGNOSIS — Z87798 Personal history of other (corrected) congenital malformations: Secondary | ICD-10-CM | POA: Diagnosis not present

## 2021-02-20 DIAGNOSIS — Z79899 Other long term (current) drug therapy: Secondary | ICD-10-CM | POA: Insufficient documentation

## 2021-02-20 DIAGNOSIS — S0990XA Unspecified injury of head, initial encounter: Secondary | ICD-10-CM | POA: Diagnosis not present

## 2021-02-20 DIAGNOSIS — W010XXA Fall on same level from slipping, tripping and stumbling without subsequent striking against object, initial encounter: Secondary | ICD-10-CM | POA: Diagnosis not present

## 2021-02-20 DIAGNOSIS — S199XXA Unspecified injury of neck, initial encounter: Secondary | ICD-10-CM | POA: Diagnosis not present

## 2021-02-20 MED ORDER — MAGNESIUM SULFATE 2 GM/50ML IV SOLN
2.0000 g | Freq: Once | INTRAVENOUS | Status: AC
  Administered 2021-02-20: 2 g via INTRAVENOUS
  Filled 2021-02-20: qty 50

## 2021-02-20 MED ORDER — DIPHENHYDRAMINE HCL 50 MG/ML IJ SOLN
12.5000 mg | Freq: Once | INTRAMUSCULAR | Status: AC
  Administered 2021-02-20: 12.5 mg via INTRAVENOUS
  Filled 2021-02-20: qty 1

## 2021-02-20 MED ORDER — KETOROLAC TROMETHAMINE 15 MG/ML IJ SOLN
15.0000 mg | Freq: Once | INTRAMUSCULAR | Status: AC
  Administered 2021-02-20: 15 mg via INTRAVENOUS
  Filled 2021-02-20: qty 1

## 2021-02-20 MED ORDER — SODIUM CHLORIDE 0.9 % IV BOLUS
1000.0000 mL | Freq: Once | INTRAVENOUS | Status: AC
  Administered 2021-02-20: 1000 mL via INTRAVENOUS

## 2021-02-20 MED ORDER — PROCHLORPERAZINE EDISYLATE 10 MG/2ML IJ SOLN
10.0000 mg | Freq: Once | INTRAMUSCULAR | Status: AC
  Administered 2021-02-20: 10 mg via INTRAVENOUS
  Filled 2021-02-20: qty 2

## 2021-02-20 NOTE — ED Notes (Signed)
Hard collar placed

## 2021-02-20 NOTE — ED Notes (Signed)
Pt NAD, a/ox4. Pt verbalizes understanding of all DC and f/u instructions. All questions answered. Pt walks with steady gait to lobby at DC where pt states husband is driving her home.

## 2021-02-20 NOTE — ED Notes (Signed)
Patient transported to CT 

## 2021-02-20 NOTE — ED Notes (Signed)
MD aware of pt.

## 2021-02-20 NOTE — Discharge Instructions (Addendum)
You are seen in the emergency department today after head injury.  We gave you our typical migraine treatment, and I am thrilled to seem to help your symptoms.  It is possible you have some related whiplash from the injury, for this I normally recommend over-the-counter medications like ibuprofen or Tylenol.  As we discussed you also can try your previously prescribed muscle relaxer.  Continue to monitor how you're doing and return to the ER for new or worsening symptoms such as lightheadedness, dizziness, new headaches or neck pain.   It has been a pleasure seeing and caring for you today and I hope you start feeling better soon!

## 2021-02-20 NOTE — ED Provider Notes (Signed)
[facility] Provider Note   CSN: [id number] Arrival date & time: 02/20/21  1459     History Chief Complaint  Patient presents with   Head Injury    Kim Pham is a 40 y.o. female with history of migraines and Chiari malformation s/p suboccipital craniotomy in 2005 who presents to the emergency department for headache and neck pain after head injury 2 days ago.  Patient states that she fell striking the occiput of her head on concrete, there was no loss of consciousness.  But she did wake up this morning and have worsening headache to the left side of her head as well as numbness of her left face.  She notes that while she was driving there was a severe increase in pain, with an audible "pop" in her ear and drainage of clear fluid from the ear.  Her pain is made worse with movement.  Today she is also complaining of nausea, dizziness, and some blurred vision in her left eye.  She is not chronically anticoagulated.   Head Injury Associated symptoms: headache, nausea, neck pain and numbness   Associated symptoms: no seizures and no vomiting       Past Medical History:  Diagnosis Date   Anxiety    Arthritis    Chiari malformation    Chronic headaches    Endometriosis    GERD (gastroesophageal reflux disease)    tums otc, prevacid prn   Headache    Insomnia 08/19/2020   Low blood pressure    pt states history of low blood pressure-fainted 12/2010-instructed to move slowly   SLE (systemic lupus erythematosus) ([place]) 03/26/2016    Patient Active Problem List   Diagnosis Date Noted   Pain in right knee 11/13/2020   Acute pain of right knee 11/04/2020   Insomnia 08/19/2020   GERD (gastroesophageal reflux disease) 08/19/2020   Psychogenic nonepileptic seizure 07/27/2017   Chronic migraine without aura, with intractable migraine, so stated, with status migrainosus 07/27/2017   Diarrhea 07/08/2017   Nonepileptic episode ([place]) 12/22/2016   Sicca  syndrome ([place]) 11/22/2016   Raynaud's disease without gangrene 11/22/2016   Hair loss 11/22/2016   Photosensitivity 11/22/2016   History of Chiari malformation 11/22/2016   High risk medication use 11/22/2016   Pain in joint, multiple sites 08/18/2016   Fatigue 03/26/2016   SLE (systemic lupus erythematosus) ([place]) 03/26/2016   Depression with anxiety 03/26/2016   Numbness and tingling 02/25/2016   Transient alteration of awareness 01/01/2016   Chiari malformation 12/19/2015    Past Surgical History:  Procedure Laterality Date   ABDOMINAL HYSTERECTOMY  2007   CYSTOSCOPY  2007   DIAGNOSTIC LAPAROSCOPY  2005   ROTATOR CUFF REPAIR  2010   right shoulder   SALPINGOOPHORECTOMY  04/16/2011   Procedure: SALPINGO OOPHERECTOMY;  Surgeon: Lovenia Kim, MD;  Location: [facility];  Service: Gynecology;  Laterality: Right;   skull decompression surgery 2005       OB History   No obstetric history on file.     Family History  Problem Relation Age of Onset   Hypertension Mother    CAD Mother    Breast cancer Mother    Migraines Mother    Gout Mother    Heart disease Mother    Crohn's disease Mother    Hypertension Father    CAD Father    CVA Father    Heart disease Father    Sjogren's syndrome Sister    Celiac disease  Sister    Rheum arthritis Maternal Grandmother    Autism Son    ADD / ADHD Son    Pancreatic cancer Maternal Grandfather    Breast cancer Maternal Aunt     Social History   Tobacco Use   Smoking status: Former    Packs/day: 1.00    Years: 10.00    Pack years: 10.00    Types: Cigarettes    Quit date: 04/04/2004    Years since quitting: 16.8   Smokeless tobacco: Never  Vaping Use   Vaping Use: Never used  Substance Use Topics   Alcohol use: Yes    Comment: occ.   Drug use: No    Home Medications Prior to Admission medications   Medication Sig Start Date End Date Taking? Authorizing Provider  diclofenac (VOLTAREN) 75 MG EC tablet Take 1 tablet  (75 mg total) by mouth 2 (two) times daily as needed. 08/08/20   Debbrah Alar, NP  esomeprazole (NEXIUM 24HR) 20 MG capsule Take 1 capsule (20 mg total) by mouth daily at 12 noon. 08/19/20   Debbrah Alar, NP  hydroxychloroquine (PLAQUENIL) 200 MG tablet TAKE ONE TABLET TWO TIMES A DAY MONDAY THROUGH FRIDAY. Patient taking differently: Take 200 mg by mouth as directed. TAKE 200mg  TWO TIMES A DAY MONDAY THROUGH FRIDAY. 07/05/17   Bo Merino, MD  hydrOXYzine (VISTARIL) 25 MG capsule Take 1 capsule (25 mg total) by mouth every 8 (eight) hours as needed. 01/18/21   Debbrah Alar, NP  Multiple Vitamins-Minerals (MULTIVITAMIN WITH MINERALS) tablet Take by mouth daily.    [provider]  oxyCODONE-acetaminophen (PERCOCET/ROXICET) 5-325 MG tablet Take 1-2 tablets by mouth every 4 (four) hours as needed for severe pain. 12/04/20   Garald Balding, MD  phentermine 37.5 MG capsule Take 37.5 mg by mouth every morning.    [provider]  SUMAtriptan (IMITREX) 100 MG tablet Take 1 tablet (100 mg total) by mouth at onset of migraine. May repeat in 2 hours if headache persists or recurs. Do not take more than 2 doses in 24 hours. 06/29/17   Melvenia Beam, MD  traZODone (DESYREL) 100 MG tablet Take 2 tablets (200 mg total) by mouth at bedtime. 10/28/20   Debbrah Alar, NP    Allergies    Effexor [venlafaxine]  Review of Systems   Review of Systems  Constitutional:  Negative for chills and fever.  HENT:  Positive for ear discharge.   Eyes:  Positive for visual disturbance.  Respiratory:  Negative for shortness of breath.   Cardiovascular:  Negative for chest pain.  Gastrointestinal:  Positive for nausea. Negative for abdominal pain and vomiting.  Musculoskeletal:  Positive for neck pain. Negative for back pain.  Neurological:  Positive for dizziness, light-headedness, numbness and headaches. Negative for tremors, seizures, syncope, speech difficulty and  weakness.  All other systems reviewed and are negative.  Physical Exam Updated Vital Signs BP 110/72   Pulse 77   Temp 98.1 F (36.7 C) (Oral)   Resp (!) 21   Ht 5\' 9"  (1.753 m)   Wt 97.5 kg   SpO2 98%   BMI 31.75 kg/m   Physical Exam Vitals and nursing note reviewed.  Constitutional:      Appearance: Normal appearance.  HENT:     Head: Normocephalic and atraumatic.     Right Ear: Tympanic membrane, ear canal and external ear normal.     Left Ear: Tympanic membrane, ear canal and external ear normal.  Eyes:  Conjunctiva/sclera: Conjunctivae normal.  Pulmonary:     Effort: Pulmonary effort is normal. No respiratory distress.  Skin:    General: Skin is warm and dry.  Neurological:     Mental Status: She is alert.     Comments: Neuro: Speech is clear, able to follow commands. CN III-XII grossly intact. PERRLA. EOMI. Mildly decreased sensation on the left arm compared to the right, sensation intact in BLE.   Psychiatric:        Mood and Affect: Mood normal.        Behavior: Behavior normal.    ED Results / Procedures / Treatments   Labs (all labs ordered are listed, but only abnormal results are displayed) Labs Reviewed - No data to display  EKG None  Radiology CT Head Wo Contrast  Result Date: 02/20/2021 CLINICAL DATA:  Status post trauma. EXAM: CT HEAD WITHOUT CONTRAST TECHNIQUE: Contiguous axial images were obtained from the base of the skull through the vertex without intravenous contrast. COMPARISON:  Aug 27, 2019 FINDINGS: Brain: No evidence of acute infarction, hemorrhage, hydrocephalus, extra-axial collection or mass lesion/mass effect. Vascular: No hyperdense vessel or unexpected calcification. Skull: A suboccipital craniotomy defect is seen. Sinuses/Orbits: No acute finding. Other: None. IMPRESSION: 1. No evidence of an acute intracranial process. 2. Status post suboccipital craniotomy. Electronically Signed   By: Virgina Norfolk M.D.   On: 02/20/2021 16:07    CT Cervical Spine Wo Contrast  Result Date: 02/20/2021 CLINICAL DATA:  Status post trauma. EXAM: CT CERVICAL SPINE WITHOUT CONTRAST TECHNIQUE: Multidetector CT imaging of the cervical spine was performed without intravenous contrast. Multiplanar CT image reconstructions were also generated. COMPARISON:  Aug 27, 2019 FINDINGS: Alignment: Normal. Skull base and vertebrae: No acute fracture. A suboccipital craniotomy defect is seen. No primary bone lesion or focal pathologic process. Soft tissues and spinal canal: No prevertebral fluid or swelling. No visible canal hematoma. Disc levels: Normal multilevel endplates are seen with normal multilevel intervertebral disc spaces. Normal bilateral multilevel facet joints are noted. Upper chest: Negative. Other: None. IMPRESSION: 1. No acute fracture or subluxation of the cervical spine. 2. Status post suboccipital craniotomy. Electronically Signed   By: Virgina Norfolk M.D.   On: 02/20/2021 16:17    Procedures Procedures   Medications Ordered in ED Medications  sodium chloride 0.9 % bolus 1,000 mL (1,000 mLs Intravenous New Bag/Given 02/20/21 1657)  ketorolac (TORADOL) 15 MG/ML injection 15 mg (15 mg Intravenous Given 02/20/21 1659)  diphenhydrAMINE (BENADRYL) injection 12.5 mg (12.5 mg Intravenous Given 02/20/21 1659)  prochlorperazine (COMPAZINE) injection 10 mg (10 mg Intravenous Given 02/20/21 1659)  magnesium sulfate IVPB 2 g 50 mL (2 g Intravenous New Bag/Given 02/20/21 1705)    ED Course  I have reviewed the triage vital signs and the nursing notes.  Pertinent labs & imaging results that were available during my care of the patient were reviewed by me and considered in my medical decision making (see chart for details).    MDM Rules/Calculators/A&P                           Patient is a 40 year old female with history of migraines and Chiari malformation s/p suboccipital craniotomy in 2005 who presents emergency department for headache and  neck pain after an injury 2 days ago.  Patient had tripped and hit the occiput of her head on a concrete step.  There is no loss of consciousness.  She also heard a "pop" in  her left ear and there was subsequent clear left ear drainage. She is not chronically anticoagulated.   On exam patient is afebrile, and in no acute distress.  Neurologic exam grossly intact as above.  Bilateral ear exam is normal.  From triage, CT head and neck imaging was ordered, and showed no acute intracranial abnormalities.  With a history of migraines, plan to treat with migraine cocktail and reevaluate.  5:50pm On reevaluation patient states that her symptoms have almost entirely resolved.  She is otherwise stable and not requiring admission or inpatient treatment for her symptoms at this time.  Discussed over-the-counter medication of lingering head and neck pain after the fall.  Discussed reasons to return to the emergency department.  Patient agreeable to plan.  Final Clinical Impression(s) / ED Diagnoses Final diagnoses:  Injury of head, initial encounter  Migraine without aura and without status migrainosus, not intractable    Rx / DC Orders ED Discharge Orders     None        Kateri Plummer, PA-C 02/20/21 [place], MD 03/06/21 [phone]

## 2021-02-20 NOTE — ED Notes (Signed)
Pt return from CT. NAD, wearing a C-collar. A/ox4, clear speech. Pt states she fell 2 days ago striking the left parietal/occipital head on concrete. Denies LOC, states woke up this morning and had a very bad headache to left side, numbness to left side, blurred vision and clear fluid coming from her left ear. +nausea, mild dizziness. -blood thinners.

## 2021-02-20 NOTE — ED Notes (Signed)
Pt states she is feeling much better.

## 2021-02-20 NOTE — ED Triage Notes (Signed)
C/o head injury x 2 days ago hitting head on cement , no LOC. X 1 hr ago c/o left sided neck pain and severe h/a ," clear liquid from left ear and cloudy vision from left eye

## 2021-02-24 ENCOUNTER — Other Ambulatory Visit: Payer: Self-pay

## 2021-02-24 ENCOUNTER — Ambulatory Visit (INDEPENDENT_AMBULATORY_CARE_PROVIDER_SITE_OTHER): Payer: BC Managed Care – PPO | Admitting: Family

## 2021-02-24 VITALS — BP 103/69 | HR 81 | Temp 98.5°F | Resp 16 | Ht 69.0 in | Wt 231.0 lb

## 2021-02-24 DIAGNOSIS — R1011 Right upper quadrant pain: Secondary | ICD-10-CM

## 2021-02-24 DIAGNOSIS — N9489 Other specified conditions associated with female genital organs and menstrual cycle: Secondary | ICD-10-CM

## 2021-02-24 DIAGNOSIS — M329 Systemic lupus erythematosus, unspecified: Secondary | ICD-10-CM

## 2021-02-24 DIAGNOSIS — R635 Abnormal weight gain: Secondary | ICD-10-CM | POA: Diagnosis not present

## 2021-02-24 DIAGNOSIS — F418 Other specified anxiety disorders: Secondary | ICD-10-CM | POA: Diagnosis not present

## 2021-02-24 DIAGNOSIS — R232 Flushing: Secondary | ICD-10-CM | POA: Diagnosis not present

## 2021-02-24 HISTORY — DX: Other specified conditions associated with female genital organs and menstrual cycle: N94.89

## 2021-02-24 NOTE — Patient Instructions (Signed)
Please schedule a follow up visit with Dr. Tomi Likens and Dr. Estanislado Pandy.

## 2021-02-24 NOTE — Progress Notes (Addendum)
Subjective:   By signing my name below, I, Lyric Barr-McArthur, attest that this documentation has been prepared under the direction and in the presence of Debbrah Alar, NP, 02/24/2021   Patient ID: Kim Pham, female    DOB: 11/30/1980, 40 y.o.   MRN: [id number]  Chief Complaint  Patient presents with   Weight management    Will like to discuss her weight   Fatigue    Complains of fatigue    HPI Patient is in today for an office visit.  Weight Issues and fatigue: She complains that she is struggling to loose weight at this time. She notes that she is seeing inches fall off around her waist when measured but not seeing a decrease in the number on the scale. She also mentions that she has began intermittent fasting where she will not eat in the morning until about noon. She denies feeling as though she replaced alcohol with snacking. She participates in cardio and strength training 3-4 times per week. She has been experiencing hot flashes along with hair growth on her chin which leads her to believe that she might be premenopausal. She has had a hysterectomy so she is unaware of where her menstrual cycle is at in terms of her period.  Anxiety: She sent a MyChart message about one week ago stating she was feeling better anxiety wise with the use of 25 mg vistaril and quitting drinking alcohol. Abdominal pain: She has been experiencing abdominal pain in her right upper quadrant and describes it feels as though it is under her ribs. She would like to have blood work performed to make sure that her baseline markers are in good standing.  Headaches: She mentioned that she went to the ED a few months ago due to an intense headache. She has not followed up with neurology at this time.  Thyroid: Her thyroid levels were most recently checked on 02/25/2020 and the results were 0.92.   Health Maintenance Due  Topic Date Due   Pneumococcal Vaccine 75-75 Years old (1 - PCV) Never done    HIV Screening  Never done   Hepatitis C Screening  Never done   COVID-19 Vaccine (4 - Booster) 04/07/2020   INFLUENZA VACCINE  11/17/2020    Past Medical History:  Diagnosis Date   Anxiety    Arthritis    Chiari malformation    Chronic headaches    Endometriosis    GERD (gastroesophageal reflux disease)    tums otc, prevacid prn   Headache    Insomnia 08/19/2020   Low blood pressure    pt states history of low blood pressure-fainted 12/2010-instructed to move slowly   SLE (systemic lupus erythematosus) ([place]) 03/26/2016    Past Surgical History:  Procedure Laterality Date   ABDOMINAL HYSTERECTOMY  2007   CYSTOSCOPY  2007   DIAGNOSTIC LAPAROSCOPY  2005   ROTATOR CUFF REPAIR  2010   right shoulder   SALPINGOOPHORECTOMY  04/16/2011   Procedure: SALPINGO OOPHERECTOMY;  Surgeon: Lovenia Kim, MD;  Location: [facility];  Service: Gynecology;  Laterality: Right;   skull decompression surgery 2005      Family History  Problem Relation Age of Onset   Hypertension Mother    CAD Mother    Breast cancer Mother    Migraines Mother    Gout Mother    Heart disease Mother    Crohn's disease Mother    Hypertension Father    CAD Father    CVA Father  Heart disease Father    Sjogren's syndrome Sister    Celiac disease Sister    Rheum arthritis Maternal Grandmother    Autism Son    ADD / ADHD Son    Pancreatic cancer Maternal Grandfather    Breast cancer Maternal Aunt     Social History   Socioeconomic History   Marital status: Married    Spouse name: Not on file   Number of children: 1   Years of education: Assoc   Highest education level: Not on file  Occupational History   Occupation: nurse  Tobacco Use   Smoking status: Former    Packs/day: 1.00    Years: 10.00    Pack years: 10.00    Types: Cigarettes    Quit date: 04/04/2004    Years since quitting: 16.9   Smokeless tobacco: Never  Vaping Use   Vaping Use: Never used  Substance and Sexual Activity    Alcohol use: Yes    Comment: occ.   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[employer]   Married   1 son- born 2004 has autism   Enjoys exercise, Surveyor, mining with son   Drinks 4-5 cups of coffee a day       Right handed   Social Determinants of Health   Financial Resource Strain: Not on file  Food Insecurity: Not on file  Transportation Needs: Not on file  Physical Activity: Not on file  Stress: Not on file  Social Connections: Not on file  Intimate Partner Violence: Not on file    Outpatient Medications Prior to Visit  Medication Sig Dispense Refill   diclofenac (VOLTAREN) 75 MG EC tablet Take 1 tablet (75 mg total) by mouth 2 (two) times daily as needed. 20 tablet 0   hydrOXYzine (VISTARIL) 25 MG capsule Take 1 capsule (25 mg total) by mouth every 8 (eight) hours as needed. 30 capsule 0   SUMAtriptan (IMITREX) 100 MG tablet Take 1 tablet (100 mg total) by mouth at onset of migraine. May repeat in 2 hours if headache persists or recurs. Do not take more than 2 doses in 24 hours. 10 tablet 2   traZODone (DESYREL) 100 MG tablet Take 2 tablets (200 mg total) by mouth at bedtime. 180 tablet 1   esomeprazole (NEXIUM 24HR) 20 MG capsule Take 1 capsule (20 mg total) by mouth daily at 12 noon.     hydroxychloroquine (PLAQUENIL) 200 MG tablet TAKE ONE TABLET TWO TIMES A DAY MONDAY THROUGH FRIDAY. (Patient taking differently: Take 200 mg by mouth as directed. TAKE 284m TWO TIMES A DAY MONDAY THROUGH FRIDAY.) 40 tablet 0   Multiple Vitamins-Minerals (MULTIVITAMIN WITH MINERALS) tablet Take by mouth daily.     oxyCODONE-acetaminophen (PERCOCET/ROXICET) 5-325 MG tablet Take 1-2 tablets by mouth every 4 (four) hours as needed for severe pain. 30 tablet 0   phentermine 37.5 MG capsule Take 37.5 mg by mouth every morning.     No facility-administered medications prior to visit.    Allergies   Allergen Reactions   Effexor [Venlafaxine] Nausea Only    insomnia    Review of Systems  Constitutional:        (+) hot flashes  Gastrointestinal:  Positive for abdominal pain (in right side).  Neurological:  Positive for headaches.  Psychiatric/Behavioral:  Substance abuse: Improved with use of Vistaril. The patient  is nervous/anxious.       Objective:    Physical Exam Constitutional:      General: She is not in acute distress.    Appearance: Normal appearance. She is not ill-appearing.  HENT:     Head: Normocephalic and atraumatic.     Right Ear: External ear normal.     Left Ear: External ear normal.  Eyes:     Extraocular Movements: Extraocular movements intact.     Pupils: Pupils are equal, round, and reactive to light.  Cardiovascular:     Rate and Rhythm: Normal rate and regular rhythm.     Heart sounds: Normal heart sounds. No murmur heard.   No gallop.  Pulmonary:     Effort: Pulmonary effort is normal. No respiratory distress.     Breath sounds: Normal breath sounds. No wheezing or rales.  Abdominal:     General: Bowel sounds are normal.     Palpations: Abdomen is soft.     Tenderness: There is abdominal tenderness in the right upper quadrant.  Skin:    General: Skin is warm and dry.  Neurological:     Mental Status: She is alert and oriented to person, place, and time.  Psychiatric:        Behavior: Behavior normal.        Judgment: Judgment normal.    BP 103/69 (BP Location: Right Arm, Patient Position: Sitting, Cuff Size: Large)   Pulse 81   Temp 98.5 F (36.9 C) (Oral)   Resp 16   Ht 5' 9" (1.753 m)   Wt 231 lb (104.8 kg)   SpO2 99%   BMI 34.11 kg/m  Wt Readings from Last 3 Encounters:  02/24/21 231 lb (104.8 kg)  02/20/21 215 lb (97.5 kg)  11/25/20 210 lb (95.3 kg)       Assessment & Plan:   Problem List Items Addressed This Visit       Unprioritized   SLE (systemic lupus erythematosus) ([place])    I advised her to schedule follow up  with Rheumatology.       Depression with anxiety    Improved.  I commended her on stopping use of alcohol.       Other Visit Diagnoses     Right upper quadrant pain    -  Primary   Relevant Orders   Comp Met (CMET)   US Abdomen Complete   Hot flashes       Relevant Orders   Estradiol   [place]   LH   Weight gain       Relevant Orders   TSH      No orders of the defined types were placed in this encounter.   I, Debbrah Alar, NP, personally preformed the services described in this documentation.  All medical record entries made by the scribe were at my direction and in my presence.  I have reviewed the chart and discharge instructions (if applicable) and agree that the record reflects my personal performance and is accurate and complete. 02/24/2021  I,Lyric Barr-McArthur,acting as a Education administrator for Nance Pear, NP.,have documented all relevant documentation on the behalf of Nance Pear, NP,as directed by  Nance Pear, NP while in the presence of Nance Pear, NP.  Nance Pear, NP

## 2021-02-25 LAB — COMPREHENSIVE METABOLIC PANEL
ALT: 62 U/L — ABNORMAL HIGH (ref 0–35)
AST: 28 U/L (ref 0–37)
Albumin: 4.5 g/dL (ref 3.5–5.2)
Alkaline Phosphatase: 85 U/L (ref 39–117)
BUN: 17 mg/dL (ref 6–23)
CO2: 27 mEq/L (ref 19–32)
Calcium: 9.5 mg/dL (ref 8.4–10.5)
Chloride: 100 mEq/L (ref 96–112)
Creatinine, Ser: 0.82 mg/dL (ref 0.40–1.20)
GFR: 89.2 mL/min (ref >60.00)
Glucose, Bld: 87 mg/dL (ref 70–99)
Potassium: 4 mEq/L (ref 3.5–5.1)
Sodium: 137 mEq/L (ref 135–145)
Total Bilirubin: 0.3 mg/dL (ref 0.2–1.2)
Total Protein: 7.1 g/dL (ref 6.0–8.3)

## 2021-02-25 LAB — LUTEINIZING HORMONE: LH: 6.04 m[IU]/mL

## 2021-02-25 LAB — TSH: TSH: 1.74 u[IU]/mL (ref 0.35–5.50)

## 2021-02-25 LAB — FOLLICLE STIMULATING HORMONE: FSH: 7.4 m[IU]/mL

## 2021-02-25 NOTE — Assessment & Plan Note (Signed)
I advised her to schedule follow up with Rheumatology.

## 2021-02-25 NOTE — Assessment & Plan Note (Signed)
Improved.  I commended her on stopping use of alcohol.

## 2021-02-27 ENCOUNTER — Ambulatory Visit (HOSPITAL_BASED_OUTPATIENT_CLINIC_OR_DEPARTMENT_OTHER): Payer: BC Managed Care – PPO

## 2021-03-06 ENCOUNTER — Telehealth: Payer: Self-pay | Admitting: Family

## 2021-03-06 NOTE — Telephone Encounter (Signed)
Could you please call the lab re: unresulted Estradiol result?

## 2021-03-11 NOTE — Telephone Encounter (Signed)
See result note.  

## 2021-03-13 NOTE — Telephone Encounter (Signed)
It looks like the estradiol specimen was never processed.  We can bring her back to repeat or plan to do it at her next appointment- whichever she prefers.

## 2021-03-16 ENCOUNTER — Encounter: Payer: Self-pay | Admitting: Family

## 2021-03-16 DIAGNOSIS — R232 Flushing: Secondary | ICD-10-CM

## 2021-03-16 NOTE — Telephone Encounter (Signed)
Left detail message for patient to call back and schedule lab appointment to repeat collection of test.

## 2021-03-16 NOTE — Telephone Encounter (Signed)
See my chart message

## 2021-03-17 NOTE — Telephone Encounter (Signed)
Spoke to patient and scheduled for tomorrow for labs

## 2021-03-18 ENCOUNTER — Other Ambulatory Visit (INDEPENDENT_AMBULATORY_CARE_PROVIDER_SITE_OTHER): Payer: BC Managed Care – PPO

## 2021-03-18 DIAGNOSIS — R232 Flushing: Secondary | ICD-10-CM

## 2021-03-18 LAB — ESTRADIOL: Estradiol: 48 pg/mL

## 2021-03-19 ENCOUNTER — Encounter: Payer: Self-pay | Admitting: Family

## 2021-03-23 ENCOUNTER — Encounter: Payer: Self-pay | Admitting: Family

## 2021-03-25 ENCOUNTER — Encounter: Payer: Self-pay | Admitting: Family

## 2021-03-25 ENCOUNTER — Telehealth: Payer: BC Managed Care – PPO | Admitting: Family

## 2021-04-14 ENCOUNTER — Telehealth (INDEPENDENT_AMBULATORY_CARE_PROVIDER_SITE_OTHER): Payer: BC Managed Care – PPO | Admitting: Medical

## 2021-04-14 ENCOUNTER — Encounter: Payer: Self-pay | Admitting: Family

## 2021-04-14 ENCOUNTER — Encounter: Payer: Self-pay | Admitting: Medical

## 2021-04-14 VITALS — BP 112/74 | HR 107

## 2021-04-14 DIAGNOSIS — U071 COVID-19: Secondary | ICD-10-CM | POA: Diagnosis not present

## 2021-04-14 DIAGNOSIS — R058 Other specified cough: Secondary | ICD-10-CM

## 2021-04-14 MED ORDER — NIRMATRELVIR/RITONAVIR (PAXLOVID)TABLET: 3.0000 | ORAL_TABLET | Freq: Two times a day (BID) | ORAL | 0 refills | Status: AC

## 2021-04-14 MED ORDER — BUDESONIDE-FORMOTEROL FUMARATE 160-4.5 MCG/ACT IN AERO: 2.0000 | INHALATION_SPRAY | Freq: Two times a day (BID) | RESPIRATORY_TRACT | 0 refills | Status: DC

## 2021-04-14 MED ORDER — FLUTICASONE PROPIONATE 50 MCG/ACT NA SUSP: 2.0000 | Freq: Every day | NASAL | 1 refills | Status: DC

## 2021-04-14 MED ORDER — HYDROCODONE BIT-HOMATROP MBR 5-1.5 MG/5ML PO SOLN: ORAL | 0 refills | Status: DC

## 2021-04-14 NOTE — Progress Notes (Addendum)
Subjective:    Patient ID: Kim Pham, female    DOB: 01-09-1981, 40 y.o.   MRN: [id number]  HPI Virtual Visit via Video Note  I connected with Kim Pham on 04/14/21 at  1:20 PM EST by a video enabled telemedicine application and verified that I am speaking with the correct person using two identifiers.  Location: Patient: home Provider: office   I discussed the limitations of evaluation and management by telemedicine and the availability of in person appointments. The patient expressed understanding and agreed to proceed.  History of Present Illness: Started Friday.   Pt recently diagnosed with covid on yesterday  Test used- otc.  History of covid vaccine x 3.  Covid risk score- 1  History of covid infection-1 other time in 2020.   Current symptoms-symptoms since Friday. See ros.  Pt 02 sat-pt o2 sat is 92%. Will go up to 95%. (Later my chart message o2 at rest 95% and walking shortly 02 sat 93%. Quick recovery to 95% resting).   No history of asthma. Was sob last night with cough. No lower ext/popliteal.     Observations/Objective:  General-no acute distress, pleasant, oriented. Lungs- on inspection lungs appear unlabored. Neck- no tracheal deviation or jvd on inspection. Neuro- gross motor function appears intact.   Assessment and Plan: Patient Instructions  COVID infection.  Day 4 of signs and symptom onset.  Risk factor of 1.  Formally vaccinated x3 with history of 1 COVID infection in the past.   Overall low risk but describes some shortness of breath last night with O2 saturation at times 93% ambulating today.  Last night brief O2 sats of 92%.  Today at rest O2 sat 95%.  Decided to go ahead and prescribe paxlovid.  Explained typically in patient your age group with low risk score and vaccines would not offer antiviral.  However since you are a day 4 of symptoms and having some mild low oxygen saturation levels decided to go ahead and make  antiviral available.  You can start within the next 24 hours if you decide to use.  Explained medication is emergency authorization use.  And best to start within 5 days of symptom onset.   Chest x-ray order placed.  Please get x-ray done today.  Would like to make sure have not developed pneumonia.   For cough prescribe Hycodan.  For wheezing or shortness of breath making Symbicort available.  Prescribed Flonase for nasal congestion.  Follow-up in 5 to 7 days or sooner if needed.    Mackie Pai, PA-C     Follow Up Instructions:    I discussed the assessment and treatment plan with the patient. The patient was provided an opportunity to ask questions and all were answered. The patient agreed with the plan and demonstrated an understanding of the instructions.   The patient was advised to call back or seek an in-person evaluation if the symptoms worsen or if the condition fails to improve as anticipated.  Time spent with patient today was  31 minutes which consisted of chart revdiew, discussing diagnosis, work up treatment and documentation.    Mackie Pai, PA-C    Review of Systems  Constitutional:  Positive for fatigue and fever.  HENT:  Positive for congestion.   Respiratory:  Positive for cough. Negative for shortness of breath and wheezing.   Cardiovascular:  Negative for chest pain and palpitations.  Gastrointestinal:  Negative for anal bleeding, constipation, diarrhea and vomiting.  Genitourinary:  Negative  for dysuria and flank pain.  Musculoskeletal:  Positive for myalgias. Negative for back pain.      Objective:   Physical Exam        Assessment & Plan:

## 2021-04-14 NOTE — Patient Instructions (Addendum)
COVID infection.  Day 4 of signs and symptom onset.  Risk factor of 1.  Formally vaccinated x3 with history of 1 COVID infection in the past.   Overall low risk but describes some shortness of breath last night with O2 saturation at times 93% ambulating today.  Last night brief O2 sats of 92%.  Today at rest O2 sat 95%.  Decided to go ahead and prescribe paxlovid.  Explained typically in patient your age group with low risk score and vaccines would not offer antiviral.  However since you are a day 4 of symptoms and having some mild low oxygen saturation levels decided to go ahead and make antiviral available.  You can start within the next 24 hours if you decide to use.  Explained medication is emergency authorization use.  And best to start within 5 days of symptom onset.   Chest x-ray order placed.  Please get x-ray done today.  Would like to make sure have not developed pneumonia.   For cough prescribe Hycodan.  For wheezing or shortness of breath making Symbicort available.  Prescribed Flonase for nasal congestion.  Follow-up in 5 to 7 days or sooner if needed.

## 2021-04-15 ENCOUNTER — Telehealth: Payer: Self-pay | Admitting: Medical

## 2021-04-15 ENCOUNTER — Other Ambulatory Visit: Payer: Self-pay

## 2021-04-15 ENCOUNTER — Ambulatory Visit (HOSPITAL_BASED_OUTPATIENT_CLINIC_OR_DEPARTMENT_OTHER)
Admission: RE | Admit: 2021-04-15 | Discharge: 2021-04-15 | Disposition: A | Discharge status: Home / Self Care | Payer: BC Managed Care – PPO | Source: Ambulatory Visit | Attending: Medical | Admitting: Medical

## 2021-04-15 DIAGNOSIS — R058 Other specified cough: Secondary | ICD-10-CM | POA: Insufficient documentation

## 2021-04-15 DIAGNOSIS — U071 COVID-19: Secondary | ICD-10-CM | POA: Insufficient documentation

## 2021-04-15 DIAGNOSIS — R0902 Hypoxemia: Secondary | ICD-10-CM | POA: Diagnosis not present

## 2021-04-15 DIAGNOSIS — R0602 Shortness of breath: Secondary | ICD-10-CM | POA: Diagnosis not present

## 2021-04-15 NOTE — Telephone Encounter (Signed)
Pt called. LDVM 

## 2021-04-15 NOTE — Telephone Encounter (Signed)
Call pharmacy and advise her to stop trazadone while on paxlovid. If pt decides to start antiviral.   I rx'd hydocan so that may help her sleep while off trazadone.

## 2021-04-21 ENCOUNTER — Other Ambulatory Visit: Payer: Self-pay

## 2021-04-21 ENCOUNTER — Ambulatory Visit (INDEPENDENT_AMBULATORY_CARE_PROVIDER_SITE_OTHER): Payer: BC Managed Care – PPO | Admitting: Medical

## 2021-04-21 ENCOUNTER — Emergency Department (HOSPITAL_BASED_OUTPATIENT_CLINIC_OR_DEPARTMENT_OTHER)
Admission: EM | Admit: 2021-04-21 | Discharge: 2021-04-21 | Disposition: A | Discharge status: Home / Self Care | Payer: BC Managed Care – PPO | Attending: Emergency Medicine | Admitting: Emergency Medicine

## 2021-04-21 ENCOUNTER — Emergency Department (HOSPITAL_BASED_OUTPATIENT_CLINIC_OR_DEPARTMENT_OTHER): Payer: BC Managed Care – PPO

## 2021-04-21 ENCOUNTER — Encounter (HOSPITAL_BASED_OUTPATIENT_CLINIC_OR_DEPARTMENT_OTHER): Payer: Self-pay | Admitting: Emergency Medicine

## 2021-04-21 VITALS — BP 122/80 | HR 99 | Temp 98.1°F | Resp 18 | Ht 69.0 in | Wt 233.2 lb

## 2021-04-21 DIAGNOSIS — U071 COVID-19: Secondary | ICD-10-CM | POA: Diagnosis not present

## 2021-04-21 DIAGNOSIS — R7989 Other specified abnormal findings of blood chemistry: Secondary | ICD-10-CM

## 2021-04-21 DIAGNOSIS — J208 Acute bronchitis due to other specified organisms: Secondary | ICD-10-CM | POA: Insufficient documentation

## 2021-04-21 DIAGNOSIS — J40 Bronchitis, not specified as acute or chronic: Secondary | ICD-10-CM | POA: Diagnosis not present

## 2021-04-21 DIAGNOSIS — R091 Pleurisy: Secondary | ICD-10-CM

## 2021-04-21 DIAGNOSIS — M79605 Pain in left leg: Secondary | ICD-10-CM

## 2021-04-21 DIAGNOSIS — M79604 Pain in right leg: Secondary | ICD-10-CM

## 2021-04-21 DIAGNOSIS — R0781 Pleurodynia: Secondary | ICD-10-CM

## 2021-04-21 DIAGNOSIS — R0789 Other chest pain: Secondary | ICD-10-CM

## 2021-04-21 DIAGNOSIS — R079 Chest pain, unspecified: Secondary | ICD-10-CM | POA: Diagnosis not present

## 2021-04-21 DIAGNOSIS — R0600 Dyspnea, unspecified: Secondary | ICD-10-CM

## 2021-04-21 HISTORY — DX: COVID-19: U07.1

## 2021-04-21 LAB — BASIC METABOLIC PANEL
Anion gap: 10 (ref 5–15)
BUN: 16 mg/dL (ref 6–20)
CO2: 23 mmol/L (ref 22–32)
Calcium: 9.5 mg/dL (ref 8.9–10.3)
Chloride: 104 mmol/L (ref 98–111)
Creatinine, Ser: 0.75 mg/dL (ref 0.44–1.00)
GFR, Estimated: >60 mL/min (ref >60)
Glucose, Bld: 106 mg/dL — ABNORMAL HIGH (ref 70–99)
Potassium: 4.2 mmol/L (ref 3.5–5.1)
Sodium: 137 mmol/L (ref 135–145)

## 2021-04-21 LAB — CBC
HCT: 40.3 % (ref 36.0–46.0)
Hemoglobin: 13.2 g/dL (ref 12.0–15.0)
MCH: 28.5 pg (ref 26.0–34.0)
MCHC: 32.8 g/dL (ref 30.0–36.0)
MCV: 87 fL (ref 80.0–100.0)
Platelets: 308 10*3/uL (ref 150–400)
RBC: 4.63 MIL/uL (ref 3.87–5.11)
RDW: 12.6 % (ref 11.5–15.5)
WBC: 5.5 10*3/uL (ref 4.0–10.5)
nRBC: 0 % (ref 0.0–0.2)

## 2021-04-21 LAB — TROPONIN I (HIGH SENSITIVITY): Troponin I (High Sensitivity): 2 ng/L (ref <18)

## 2021-04-21 MED ORDER — PREDNISONE 20 MG PO TABS: 40.0000 mg | ORAL_TABLET | Freq: Every day | ORAL | 0 refills | Status: DC

## 2021-04-21 NOTE — Progress Notes (Signed)
Subjective:    Patient ID: Kim Pham, female    DOB: Apr 22, 1980, 41 y.o.   MRN: [id number]  HPI  Pt in for follow up.   Pt had covid infection. See prior video visit not. Pt has some fast heart rate with minimal activity. She states her heart rate will go up to 125 easily. Such as today just making be hr went up quickly.  Pt sent me a message stating she was having short of breath. I had advised to go to ED out of caution. Pt did not go to ED.  Pt told me on video visit no leg pain. But does now report some upper outer calf pain for one day.  She has some pain left side and mid chest when she takes a deep breath but some pain in her left upper back as well.  When takes deep breath some pain in left upper back as well.  Pt is till having residual cough.    I had prescribed paxlovid for patient. Pt never took paxlovid as concern for trazadone interaction.  Review of Systems  Constitutional:  Positive for fatigue. Negative for chills and fever.  HENT:  Negative for congestion.   Respiratory:  Positive for cough and chest tightness. Negative for shortness of breath and wheezing.   Cardiovascular:  Negative for chest pain and palpitations.  Gastrointestinal:  Negative for abdominal pain.  Genitourinary:  Negative for dysuria.  Musculoskeletal:  Negative for back pain and joint swelling.  Skin:  Negative for rash.  Neurological:  Negative for dizziness, syncope, weakness, numbness and headaches.  Hematological:  Negative for adenopathy. Does not bruise/bleed easily.  Psychiatric/Behavioral:  Negative for confusion and dysphoric mood.     Past Medical History:  Diagnosis Date   Anxiety    Arthritis    Chiari malformation    Chronic headaches    Endometriosis    GERD (gastroesophageal reflux disease)    tums otc, prevacid prn   Headache    Insomnia 08/19/2020   Low blood pressure    pt states history of low blood pressure-fainted 12/2010-instructed to move slowly    SLE (systemic lupus erythematosus) ([place]) 03/26/2016     Social History   Socioeconomic History   Marital status: Married    Spouse name: Not on file   Number of children: 1   Years of education: Assoc   Highest education level: Not on file  Occupational History   Occupation: nurse  Tobacco Use   Smoking status: Former    Packs/day: 1.00    Years: 10.00    Pack years: 10.00    Types: Cigarettes    Quit date: 04/04/2004    Years since quitting: 17.0   Smokeless tobacco: Never  Vaping Use   Vaping Use: Never used  Substance and Sexual Activity   Alcohol use: Yes    Comment: occ.   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[employer]   Married   1 son- born 2004 has autism   Enjoys exercise, special olympics with son   Drinks 4-5 cups of coffee a day       Right handed   Social Determinants of Health   Financial Resource Strain: Not on file  Food Insecurity: Not on file  Transportation Needs: Not on file  Physical Activity: Not on file  Stress: Not  on file  Social Connections: Not on file  Intimate Partner Violence: Not on file    Past Surgical History:  Procedure Laterality Date   ABDOMINAL HYSTERECTOMY  2007   CYSTOSCOPY  2007   DIAGNOSTIC LAPAROSCOPY  2005   ROTATOR CUFF REPAIR  2010   right shoulder   SALPINGOOPHORECTOMY  04/16/2011   Procedure: SALPINGO OOPHERECTOMY;  Surgeon: Lovenia Kim, MD;  Location: [facility];  Service: Gynecology;  Laterality: Right;   skull decompression surgery 2005      Family History  Problem Relation Age of Onset   Hypertension Mother    CAD Mother    Breast cancer Mother    Migraines Mother    Gout Mother    Heart disease Mother    Crohn's disease Mother    Hypertension Father    CAD Father    CVA Father    Heart disease Father    Sjogren's syndrome Sister    Celiac disease Sister    Rheum arthritis Maternal Grandmother     Autism Son    ADD / ADHD Son    Pancreatic cancer Maternal Grandfather    Breast cancer Maternal Aunt     Allergies  Allergen Reactions   Effexor [Venlafaxine] Nausea Only    insomnia    Current Outpatient Medications on File Prior to Visit  Medication Sig Dispense Refill   budesonide-formoterol (SYMBICORT) 160-4.5 MCG/ACT inhaler Inhale 2 puffs into the lungs 2 (two) times daily. 1 each 0   diclofenac (VOLTAREN) 75 MG EC tablet Take 1 tablet (75 mg total) by mouth 2 (two) times daily as needed. 20 tablet 0   fluticasone (FLONASE) 50 MCG/ACT nasal spray Place 2 sprays into both nostrils daily. 16 g 1   hydrOXYzine (VISTARIL) 25 MG capsule Take 1 capsule (25 mg total) by mouth every 8 (eight) hours as needed. 30 capsule 0   SUMAtriptan (IMITREX) 100 MG tablet Take 1 tablet (100 mg total) by mouth at onset of migraine. May repeat in 2 hours if headache persists or recurs. Do not take more than 2 doses in 24 hours. 10 tablet 2   traZODone (DESYREL) 100 MG tablet Take 2 tablets (200 mg total) by mouth at bedtime. 180 tablet 1   No current facility-administered medications on file prior to visit.    BP 122/80 (BP Location: Left Arm, Patient Position: Sitting, Cuff Size: Normal)    Pulse 99    Temp 98.1 F (36.7 C) (Oral)    Resp 18    Ht 5\' 9"  (1.753 m)    Wt 233 lb 3.2 oz (105.8 kg)    SpO2 96%    BMI 34.44 kg/m       Objective:   Physical Exam  General- No acute distress. Pleasant patient. Neck- Full range of motion, no jvd Lungs- Clear, even and unlabored. Heart- regular rate and rhythm.(Hr before walk down hall 101. Walking to lab decreased to 74. Stay low. Then increased to 121. Then dropped to back below to upper 90's.(Felt some light headed and chest pain on walking) Neurologic- CNII- XII grossly intact.  Rt lower ext- mild tenderness to palpation of rt lateral knee. Negative homans sign but slight popliteal bulge rt side.  Left side- negative homans sign. No pain.  Left  lower ext- negative homans sign. Anterior chest- mid chest wall pain on palpation. Back- left sided pleuritic pain on deep inspiration.      Assessment & Plan:   Patient Instructions  Recent COVID  infection.  Early on you were describing some significant shortness of breath with lower and O2 sats in the 93% range.  Though your O2 saturations have fluctuated and now actually good.  However you do describe some periodic moderate tachycardic and variation of pulse.  Might have dysautonomic regulation due to prior COVID infection.   You also reports new onset right lower extremity pain along with some left side chest pain and some pleuritic pain.  Your EKG shows normal sinus rhythm.  Today in office he described lightheaded sensation and weakness ambulating to lab.  In light of the whole clinical picture considered during outpatient work-up here versus through the emergency department.  I think it safest and low suspicion to do it through the ED.  They came to [place] faster than our office and might consider CT imaging.  Possible event right lower extremity ultrasound?  I called downstairs today to talk to the ED MD.  Follow-up with PCP as regular scheduled or sooner if needed Per ED recommended timeframe for follow-up.   Time spent with patient today was 42  minutes which consisted of chart revdiew, discussing diagnosis, work up treatment and documentation.

## 2021-04-21 NOTE — Patient Instructions (Addendum)
Recent COVID infection.  Early on you were describing some significant shortness of breath with lower and O2 sats in the 93% range.  Though your O2 saturations have fluctuated and now actually good.  However you do describe some periodic moderate tachycardic and variation of pulse.  Might have dysautonomic regulation due to prior COVID infection.   You also reports new onset right lower extremity pain along with some left side chest pain and some pleuritic pain.  Your EKG shows normal sinus rhythm.  Today in office he described lightheaded sensation and weakness ambulating to lab.  In light of the whole clinical picture considered during outpatient work-up here versus through the emergency department.  I think it safest and low suspicion to do it through the ED.  They came to [facility] faster than our office and might consider CT imaging.  Possible event right lower extremity ultrasound?  I called downstairs today to talk to the ED MD.  Follow-up with PCP as regular scheduled or sooner if needed Per ED recommended timeframe for follow-up.

## 2021-04-21 NOTE — ED Triage Notes (Signed)
Pt reports LT CP w/ radiation to back; [facility]  w/ exertion; pain behind RT knee that started last pm; dx w/ Covid 8 days ago

## 2021-04-21 NOTE — Discharge Instructions (Addendum)
Please use Tylenol or ibuprofen for pain.  You may use 600 mg ibuprofen every 6 hours or 1000 mg of Tylenol every 6 hours.  You may choose to alternate between the 2.  This would be most effective.  Not to exceed 4 g of Tylenol within 24 hours.  Not to exceed 3200 mg ibuprofen 24 hours.  Please follow up with your PCP for further evaluation at your earliest convenience. If your symptoms continue to worsen or linger for an additional 1-2 weeks I recommend you return for further evaluation.

## 2021-04-21 NOTE — ED Provider Notes (Signed)
[facility]ER[facility]ENC[facility] DEPARTMENT Provider Note   CSN: [id number] Arrival date & time: 04/21/21  1000     History  Chief Complaint  Patient presents with   Chest Pain    Kim Pham is a 41 y.o. female with a past medical history significant for lupus, and recent COVID infection 8 days ago who presents with chest pain present on the left side, shortness of breath with exertion, as well as some pain behind her right knee that started last night.  Patient reports that she was feeling improvement from her COVID, but now she is feeling overall fatigued, short of breath, with new chest pain for the last 2 days that has been constant in nature, feels like squeezing on the left side.  Patient reports that the chest pain is associated with exertion. No personal hx of diabetes, ACS, HTN, hypercholesteremia.   Chest Pain Associated symptoms: shortness of breath       Home Medications Prior to Admission medications   Medication Sig Start Date End Date Taking? Authorizing Provider  predniSONE (DELTASONE) 20 MG tablet Take 2 tablets (40 mg total) by mouth daily. 04/21/21  Yes Patrycja Mumpower H, PA-C  budesonide-formoterol (SYMBICORT) 160-4.5 MCG/ACT inhaler Inhale 2 puffs into the lungs 2 (two) times daily. 04/14/21   Saguier, Percell Miller, PA-C  diclofenac (VOLTAREN) 75 MG EC tablet Take 1 tablet (75 mg total) by mouth 2 (two) times daily as needed. 08/08/20   Debbrah Alar, NP  fluticasone (FLONASE) 50 MCG/ACT nasal spray Place 2 sprays into both nostrils daily. 04/14/21   Saguier, Percell Miller, PA-C  hydrOXYzine (VISTARIL) 25 MG capsule Take 1 capsule (25 mg total) by mouth every 8 (eight) hours as needed. 01/18/21   Debbrah Alar, NP  SUMAtriptan (IMITREX) 100 MG tablet Take 1 tablet (100 mg total) by mouth at onset of migraine. May repeat in 2 hours if headache persists or recurs. Do not take more than 2 doses in 24 hours. 06/29/17   Melvenia Beam, MD  traZODone (DESYREL) 100  MG tablet Take 2 tablets (200 mg total) by mouth at bedtime. 10/28/20   Debbrah Alar, NP      Allergies    Effexor [venlafaxine]    Review of Systems   Review of Systems  Respiratory:  Positive for shortness of breath.   Cardiovascular:  Positive for chest pain.  Musculoskeletal:  Positive for joint swelling.  All other systems reviewed and are negative.  Physical Exam Updated Vital Signs BP 123/68    Pulse (!) 58    Temp 98.6 F (37 C) (Oral)    Resp 16    Ht 5\' 9"  (1.753 m)    Wt 104.3 kg    SpO2 99%    BMI 33.97 kg/m  Physical Exam Vitals and nursing note reviewed.  Constitutional:      General: She is not in acute distress.    Appearance: Normal appearance.  HENT:     Head: Normocephalic and atraumatic.  Eyes:     General:        Right eye: No discharge.        Left eye: No discharge.  Cardiovascular:     Rate and Rhythm: Normal rate and regular rhythm.     Heart sounds: No murmur heard.   No friction rub. No gallop.  Pulmonary:     Effort: Pulmonary effort is normal.     Breath sounds: Normal breath sounds.  Abdominal:     General: Bowel sounds are  normal.     Palpations: Abdomen is soft.  Musculoskeletal:     Right lower leg: Tenderness present. No edema.     Comments: No redness or focal swelling noted right lower extremity  Skin:    General: Skin is warm and dry.     Capillary Refill: Capillary refill takes less than 2 seconds.  Neurological:     Mental Status: She is alert and oriented to person, place, and time.  Psychiatric:        Mood and Affect: Mood normal.        Behavior: Behavior normal.    ED Results / Procedures / Treatments   Labs (all labs ordered are listed, but only abnormal results are displayed) Labs Reviewed  BASIC METABOLIC PANEL - Abnormal; Notable for the following components:      Result Value   Glucose, Bld 106 (*)    All other components within normal limits  CBC  TROPONIN I (HIGH SENSITIVITY)     EKG None  Radiology DG Chest 2 View  Result Date: 04/21/2021 CLINICAL DATA:  Chest pain EXAM: CHEST - 2 VIEW COMPARISON:  Chest radiograph dated April 15, 2021 FINDINGS: The heart size and mediastinal contours are within normal limits. Both lungs are clear. The visualized skeletal structures are unremarkable. IMPRESSION: No active cardiopulmonary disease. Electronically Signed   By: Keane Police D.O.   On: 04/21/2021 11:04   US Venous Img Lower Right (DVT Study)  Result Date: 04/21/2021 CLINICAL DATA:  Right leg pain EXAM: RIGHT LOWER EXTREMITY VENOUS DOPPLER ULTRASOUND TECHNIQUE: Gray-scale sonography with compression, as well as color and duplex ultrasound, were performed to evaluate the deep venous system(s) from the level of the common femoral vein through the popliteal and proximal calf veins. COMPARISON:  None. FINDINGS: VENOUS Normal compressibility of the common femoral, superficial femoral, and popliteal veins, as well as the visualized calf veins. Visualized portions of profunda femoral vein and great saphenous vein unremarkable. No filling defects to suggest DVT on grayscale or color Doppler imaging. Doppler waveforms show normal direction of venous flow, normal respiratory plasticity and response to augmentation. Limited views of the contralateral common femoral vein are unremarkable. OTHER None. Limitations: none IMPRESSION: No evidence of right lower extremity DVT. Electronically Signed   By: Macy Mis M.D.   On: 04/21/2021 14:20    Procedures Procedures    Medications Ordered in ED Medications - No data to display  ED Course/ Medical Decision Making/ A&P                           Medical Decision Making  Given the large differential diagnosis for DIANE HANEL, the decision making in this case is of high complexity.  After evaluating all of the data points in this case, the presentation of LYNNA ZAMORANO is NOT consistent with Acute Coronary Syndrome (ACS)  and/or myocardial ischemia, pulmonary embolism, aortic dissection; Borhaave's, significant arrythmia, pneumothorax, cardiac tamponade, or other emergent cardiopulmonary condition.  Further, the presentation of STORI ROYSE is NOT consistent with pericarditis, myocarditis, cholecystitis, pancreatitis, mediastinitis, endocarditis, new valvular disease.  Additionally, the presentation of SHAVONNA CORELLA is NOT consistent with flail chest, cardiac contusion, ARDS, or significant intra-thoracic or intra-abdominal bleeding.  Moreover, this presentation is NOT consistent with pneumonia, sepsis, or pyelonephritis.  The patient has a heart score of 2.  Given that she has right-sided leg pain, along with some chest pain, and tachycardia with exertion as  well as shortness of breath you have minimal clinical suspicion for DVT, pulmonary embolism.  We will obtain venous ultrasound study of the right lower extremity to rule out.  Patient does not have clinical appearance of DVT, however with recent COVID she does have slightly increased coagulability.  DVT ultrasound study of right lower extremity is negative at this time.  In context of recent illness, chest pain that is worse with exertion, and breathing feel that patient has components of acute bronchitis versus pleurisy.  Recommend short course of pulse dose steroids, as well as continued use of ibuprofen, Tylenol.  Encouraged follow-up with PCP as some of her symptoms also seem potentially consistent with long haul COVID.  Strict return and follow-up precautions have been given by me personally or by detailed written instruction given verbally by nursing staff using the teach back method to the patient/family/caregiver(s).  Data Reviewed/Counseling: I have reviewed the patient's vital signs, nursing notes, and other relevant tests/information. I had a detailed discussion regarding the historical points, exam findings, and any diagnostic results supporting  the discharge diagnosis. I also discussed the need for outpatient follow-up and the need to return to the ED if symptoms worsen or if there are any questions or concerns that arise at home.  Final Clinical Impression(s) / ED Diagnoses Final diagnoses:  Acute bronchitis due to other specified organisms  Pleurisy    Rx / DC Orders ED Discharge Orders          Ordered    predniSONE (DELTASONE) 20 MG tablet  Daily        04/21/21 1438              Math Brazie, [place] H, PA-C 04/21/21 1505    Gareth Morgan, MD 04/21/21 2237

## 2021-04-22 ENCOUNTER — Other Ambulatory Visit: Payer: Self-pay

## 2021-04-22 ENCOUNTER — Other Ambulatory Visit: Payer: Self-pay | Admitting: Family

## 2021-04-22 ENCOUNTER — Telehealth: Payer: Self-pay | Admitting: Medical

## 2021-04-22 ENCOUNTER — Encounter: Payer: Self-pay | Admitting: Family

## 2021-04-22 ENCOUNTER — Other Ambulatory Visit (INDEPENDENT_AMBULATORY_CARE_PROVIDER_SITE_OTHER): Payer: BC Managed Care – PPO

## 2021-04-22 DIAGNOSIS — R0600 Dyspnea, unspecified: Secondary | ICD-10-CM | POA: Diagnosis not present

## 2021-04-22 LAB — D-DIMER, QUANTITATIVE: D-Dimer, Quant: 0.75 mcg/mL FEU — ABNORMAL HIGH (ref <0.50)

## 2021-04-22 NOTE — Addendum Note (Signed)
Addended by: Manuela Schwartz on: 04/22/2021 03:05 PM   Modules accepted: Orders

## 2021-04-22 NOTE — Telephone Encounter (Signed)
Please call pt and get her scheduled today to get d-dimer stat. Also will you or lab staff check her 02 saturation. Let me know the 02 sat.  Thanks,.

## 2021-04-22 NOTE — Addendum Note (Signed)
Addended by: Anabel Halon on: 04/22/2021 04:37 PM   Modules accepted: Orders

## 2021-04-22 NOTE — Addendum Note (Signed)
Addended by: Anabel Halon on: 04/22/2021 01:59 PM   Modules accepted: Orders

## 2021-04-22 NOTE — Telephone Encounter (Signed)
Appt made

## 2021-04-23 ENCOUNTER — Ambulatory Visit (HOSPITAL_BASED_OUTPATIENT_CLINIC_OR_DEPARTMENT_OTHER)
Admission: RE | Admit: 2021-04-23 | Discharge: 2021-04-23 | Disposition: A | Discharge status: Home / Self Care | Payer: BC Managed Care – PPO | Source: Ambulatory Visit | Attending: Medical | Admitting: Medical

## 2021-04-23 ENCOUNTER — Other Ambulatory Visit: Payer: Self-pay

## 2021-04-23 DIAGNOSIS — R911 Solitary pulmonary nodule: Secondary | ICD-10-CM | POA: Diagnosis not present

## 2021-04-23 DIAGNOSIS — R7989 Other specified abnormal findings of blood chemistry: Secondary | ICD-10-CM | POA: Diagnosis not present

## 2021-04-23 DIAGNOSIS — I700 Atherosclerosis of aorta: Secondary | ICD-10-CM | POA: Diagnosis not present

## 2021-04-23 DIAGNOSIS — Z0389 Encounter for observation for other suspected diseases and conditions ruled out: Secondary | ICD-10-CM | POA: Diagnosis not present

## 2021-04-23 DIAGNOSIS — I2510 Atherosclerotic heart disease of native coronary artery without angina pectoris: Secondary | ICD-10-CM | POA: Diagnosis not present

## 2021-04-23 DIAGNOSIS — R0781 Pleurodynia: Secondary | ICD-10-CM | POA: Insufficient documentation

## 2021-04-23 MED ORDER — IOHEXOL 350 MG/ML SOLN
100.0000 mL | Freq: Once | INTRAVENOUS | Status: AC | PRN
  Administered 2021-04-23: 100 mL via INTRAVENOUS

## 2021-04-23 NOTE — Addendum Note (Signed)
Addended by: Jeronimo Greaves on: 04/23/2021 03:38 PM   Modules accepted: Orders

## 2021-04-24 ENCOUNTER — Encounter: Payer: Self-pay | Admitting: Medical

## 2021-04-24 MED ORDER — CYCLOBENZAPRINE HCL 10 MG PO TABS: 10.0000 mg | ORAL_TABLET | Freq: Every day | ORAL | 0 refills | Status: DC

## 2021-04-24 NOTE — Addendum Note (Signed)
Addended by: Anabel Halon on: 04/24/2021 09:01 AM   Modules accepted: Orders

## 2021-04-28 ENCOUNTER — Encounter: Payer: Self-pay | Admitting: Family

## 2021-04-28 DIAGNOSIS — M329 Systemic lupus erythematosus, unspecified: Secondary | ICD-10-CM

## 2021-05-10 ENCOUNTER — Other Ambulatory Visit: Payer: Self-pay | Admitting: Medical

## 2021-05-15 ENCOUNTER — Encounter: Payer: Self-pay | Admitting: Family

## 2021-05-18 DIAGNOSIS — D539 Nutritional anemia, unspecified: Secondary | ICD-10-CM | POA: Diagnosis not present

## 2021-05-18 DIAGNOSIS — R0602 Shortness of breath: Secondary | ICD-10-CM | POA: Diagnosis not present

## 2021-05-18 DIAGNOSIS — E559 Vitamin D deficiency, unspecified: Secondary | ICD-10-CM | POA: Diagnosis not present

## 2021-05-18 DIAGNOSIS — R5383 Other fatigue: Secondary | ICD-10-CM | POA: Diagnosis not present

## 2021-05-18 DIAGNOSIS — Z6834 Body mass index (BMI) 34.0-34.9, adult: Secondary | ICD-10-CM | POA: Diagnosis not present

## 2021-05-18 DIAGNOSIS — E8881 Metabolic syndrome: Secondary | ICD-10-CM | POA: Diagnosis not present

## 2021-05-18 DIAGNOSIS — R635 Abnormal weight gain: Secondary | ICD-10-CM | POA: Diagnosis not present

## 2021-05-18 DIAGNOSIS — N914 Secondary oligomenorrhea: Secondary | ICD-10-CM | POA: Diagnosis not present

## 2021-05-18 DIAGNOSIS — Z79899 Other long term (current) drug therapy: Secondary | ICD-10-CM | POA: Diagnosis not present

## 2021-05-18 DIAGNOSIS — E7800 Pure hypercholesterolemia, unspecified: Secondary | ICD-10-CM | POA: Diagnosis not present

## 2021-05-18 DIAGNOSIS — Z1331 Encounter for screening for depression: Secondary | ICD-10-CM | POA: Diagnosis not present

## 2021-05-18 DIAGNOSIS — Z1159 Encounter for screening for other viral diseases: Secondary | ICD-10-CM | POA: Diagnosis not present

## 2021-05-18 DIAGNOSIS — E349 Endocrine disorder, unspecified: Secondary | ICD-10-CM | POA: Diagnosis not present

## 2021-05-18 DIAGNOSIS — Z131 Encounter for screening for diabetes mellitus: Secondary | ICD-10-CM | POA: Diagnosis not present

## 2021-05-29 ENCOUNTER — Other Ambulatory Visit: Payer: Self-pay | Admitting: Family

## 2021-05-29 ENCOUNTER — Encounter: Payer: Self-pay | Admitting: Family

## 2021-06-01 ENCOUNTER — Ambulatory Visit (INDEPENDENT_AMBULATORY_CARE_PROVIDER_SITE_OTHER): Payer: BC Managed Care – PPO | Admitting: Family

## 2021-06-01 VITALS — BP 94/57 | HR 91 | Temp 98.4°F | Resp 16 | Wt 228.0 lb

## 2021-06-01 DIAGNOSIS — Z114 Encounter for screening for human immunodeficiency virus [HIV]: Secondary | ICD-10-CM

## 2021-06-01 DIAGNOSIS — M329 Systemic lupus erythematosus, unspecified: Secondary | ICD-10-CM

## 2021-06-01 DIAGNOSIS — Z1159 Encounter for screening for other viral diseases: Secondary | ICD-10-CM

## 2021-06-01 DIAGNOSIS — K219 Gastro-esophageal reflux disease without esophagitis: Secondary | ICD-10-CM | POA: Diagnosis not present

## 2021-06-01 DIAGNOSIS — M25561 Pain in right knee: Secondary | ICD-10-CM

## 2021-06-01 DIAGNOSIS — G8929 Other chronic pain: Secondary | ICD-10-CM

## 2021-06-01 DIAGNOSIS — F418 Other specified anxiety disorders: Secondary | ICD-10-CM

## 2021-06-01 MED ORDER — FLUTICASONE PROPIONATE 50 MCG/ACT NA SUSP: 2.0000 | Freq: Every day | NASAL | 1 refills | Status: DC | PRN

## 2021-06-01 MED ORDER — OMEPRAZOLE 40 MG PO CPDR: 40.0000 mg | DELAYED_RELEASE_CAPSULE | Freq: Every day | ORAL | 3 refills | Status: DC

## 2021-06-01 NOTE — Progress Notes (Deleted)
Subjective:     Patient ID: Kim Pham, female    DOB: [birth year], 41 y.o.   MRN: [id number]  Chief Complaint  Patient presents with   Follow-up    Patient here for 3 months follow up   Back Pain    Complains of lower back pain   Knee Pain    Patient complains of right knee pain for a week after twisting foot    HPI Patient is in today for follow up.  SLE- has   Depression/anxiety- last visit she reported improvement off of alcohol.   Abdominal pain- reports very rare RUQ pain. Had normal LFT's recently at her weight loss clinic.    Wt Readings from Last 3 Encounters:  06/01/21 228 lb (103.4 kg)  04/21/21 230 lb (104.3 kg)  04/21/21 233 lb 3.2 oz (105.8 kg)   Last visit she complained of RUQ pain. An abdominal US was ordered but not completed.   Health Maintenance Due  Topic Date Due   Hepatitis C Screening  Never done   COVID-19 Vaccine (5 - Booster) 04/07/2020    Past Medical History:  Diagnosis Date   Anxiety    Arthritis    Chiari malformation    Chronic headaches    COVID    Endometriosis    GERD (gastroesophageal reflux disease)    tums otc, prevacid prn   Headache    Insomnia 08/19/2020   Low blood pressure    pt states history of low blood pressure-fainted 12/2010-instructed to move slowly   SLE (systemic lupus erythematosus) ([facility]) 03/26/2016    Past Surgical History:  Procedure Laterality Date   ABDOMINAL HYSTERECTOMY  2007   CYSTOSCOPY  2007   DIAGNOSTIC LAPAROSCOPY  2005   ROTATOR CUFF REPAIR  2010   right shoulder   SALPINGOOPHORECTOMY  04/16/2011   Procedure: SALPINGO OOPHERECTOMY;  Surgeon: Lovenia Kim, MD;  Location: [facility];  Service: Gynecology;  Laterality: Right;   skull decompression surgery 2005      Family History  Problem Relation Age of Onset   Hypertension Mother    CAD Mother    Breast cancer Mother    Migraines Mother    Gout Mother    Heart disease Mother    Crohn's disease Mother    Hypertension  Father    CAD Father    CVA Father    Heart disease Father    Sjogren's syndrome Sister    Celiac disease Sister    Rheum arthritis Maternal Grandmother    Autism Son    ADD / ADHD Son    Pancreatic cancer Maternal Grandfather    Breast cancer Maternal Aunt     Social History   Socioeconomic History   Marital status: Married    Spouse name: Not on file   Number of children: 1   Years of education: Assoc   Highest education level: Not on file  Occupational History   Occupation: nurse  Tobacco Use   Smoking status: Former    Packs/day: 1.00    Years: 10.00    Pack years: 10.00    Types: Cigarettes    Quit date: 04/04/2004    Years since quitting: 17.1   Smokeless tobacco: Never  Vaping Use   Vaping Use: Never used  Substance and Sexual Activity   Alcohol use: Yes    Comment: occ.   Drug use: No   Sexual activity: Not on file  Other Topics Concern   Not on file  Social History Narrative   Works as an Corporate treasurer @ [employer]   Married   1 son- born 2004 has autism   Enjoys exercise, Surveyor, mining with son   Drinks 4-5 cups of coffee a day       Right handed   Social Determinants of Health   Financial Resource Strain: Not on file  Food Insecurity: Not on file  Transportation Needs: Not on file  Physical Activity: Not on file  Stress: Not on file  Social Connections: Not on file  Intimate Partner Violence: Not on file    Outpatient Medications Prior to Visit  Medication Sig Dispense Refill   hydrOXYzine (VISTARIL) 25 MG capsule TAKE 1 CAPSULE BY MOUTH EVERY 8 HOURS AS NEEDED 30 capsule 0   SUMAtriptan (IMITREX) 100 MG tablet Take 1 tablet (100 mg total) by mouth at onset of migraine. May repeat in 2 hours if headache persists or recurs. Do not take more than 2 doses in 24 hours. 10 tablet 2   traZODone (DESYREL) 100 MG tablet TAKE TWO TABLETS BY MOUTH EVERY NIGHT AT BEDTIME 180 tablet 1   cyclobenzaprine (FLEXERIL) 10 MG tablet Take 1 tablet (10 mg total)  by mouth at bedtime. 7 tablet 0   diclofenac (VOLTAREN) 75 MG EC tablet Take 1 tablet (75 mg total) by mouth 2 (two) times daily as needed. 20 tablet 0   fluticasone (FLONASE) 50 MCG/ACT nasal spray Place 2 sprays into both nostrils daily. 16 g 1   predniSONE (DELTASONE) 20 MG tablet Take 2 tablets (40 mg total) by mouth daily. 10 tablet 0   SYMBICORT 160-4.5 MCG/ACT inhaler INHALE 2 PUFFS INTO THE LUNGS TWO TIMES A DAY 10.2 g 2   No facility-administered medications prior to visit.    Allergies  Allergen Reactions   Effexor [Venlafaxine] Nausea Only    insomnia    ROS     Objective:    Physical Exam  BP (!) 94/57 (BP Location: Right Arm, Patient Position: Sitting, Cuff Size: Large)    Pulse 91    Temp 98.4 F (36.9 C) (Oral)    Resp 16    Wt 228 lb (103.4 kg)    SpO2 98%    BMI 33.67 kg/m  Wt Readings from Last 3 Encounters:  06/01/21 228 lb (103.4 kg)  04/21/21 230 lb (104.3 kg)  04/21/21 233 lb 3.2 oz (105.8 kg)       Assessment & Plan:   Problem List Items Addressed This Visit   None Visit Diagnoses     Need for hepatitis C screening test    -  Primary   Relevant Orders   Hepatitis C Antibody   Encounter for screening for HIV       Relevant Orders   HIV antibody (with reflex)       I have discontinued Lequita Asal. [place]'s diclofenac, predniSONE, cyclobenzaprine, and Symbicort. I have also changed her fluticasone. Additionally, I am having her start on omeprazole. Lastly, I am having her maintain her SUMAtriptan, traZODone, and hydrOXYzine.  Meds ordered this encounter  Medications   omeprazole (PRILOSEC) 40 MG capsule    Sig: Take 1 capsule (40 mg total) by mouth daily.    Dispense:  30 capsule    Refill:  3    Order Specific Question:   Supervising Provider    Answer:   Penni Homans A [[id number]]   fluticasone (FLONASE) 50 MCG/ACT nasal spray    Sig: Place 2 sprays into both nostrils  daily as needed for allergies or rhinitis.    Dispense:  16 g    Refill:   1    Order Specific Question:   Supervising Provider    Answer:   Penni Homans A [[id number]]

## 2021-06-01 NOTE — Progress Notes (Signed)
Subjective:     Patient ID: Kim Pham, female    DOB: 12/21/1980, 41 y.o.   MRN: [id number]  Chief Complaint  Patient presents with   Follow-up    Patient here for 3 months follow up   Back Pain    Complains of lower back pain   Knee Pain    Patient complains of right knee pain for a week after twisting foot    Back Pain Pertinent negatives include no chest pain, dysuria, fever or headaches.  Knee Pain   Patient is in today for follow up.  Depression/anxiety- last visit she reported improvement off of alcohol. Today, she reports she is still not drinking alcohol ut sill has episodes of anxiety that makes it hard for her to sleep at night. Notes it is not as bad as when she was drinking. Still uses 100 mg trazodone and 25 mg hydroxyzine prn to sleep  Reflux- She reports she still has a hoarse voice and has a hard time swallowing pills. She is also getting choked by coffee. Denies any respiratory problems.  Back Pain- She reports that while jogging a week ago, she started experiencing back pain with right knee pain after twisting her foot. Not too worried about it now. Last visit she complained of RUQ pain. An abdominal US was ordered but not completed.  Lupus- She has an appointment with rheumatology next month. No recent migraines.  Still has 100 mg Imitrex prn.  Immunizations- She has received her flu vaccine. She has 3 Covid-19 vaccines at this time.   Health Maintenance Due  Topic Date Due   Hepatitis C Screening  Never done   COVID-19 Vaccine (5 - Booster) 04/07/2020    Past Medical History:  Diagnosis Date   Anxiety    Arthritis    Chiari malformation    Chronic headaches    COVID    Endometriosis    GERD (gastroesophageal reflux disease)    tums otc, prevacid prn   Headache    Insomnia 08/19/2020   Low blood pressure    pt states history of low blood pressure-fainted 12/2010-instructed to move slowly   SLE (systemic lupus erythematosus) ([place])  03/26/2016    Past Surgical History:  Procedure Laterality Date   ABDOMINAL HYSTERECTOMY  2007   CYSTOSCOPY  2007   DIAGNOSTIC LAPAROSCOPY  2005   ROTATOR CUFF REPAIR  2010   right shoulder   SALPINGOOPHORECTOMY  04/16/2011   Procedure: SALPINGO OOPHERECTOMY;  Surgeon: Lovenia Kim, MD;  Location: [facility];  Service: Gynecology;  Laterality: Right;   skull decompression surgery 2005      Family History  Problem Relation Age of Onset   Hypertension Mother    CAD Mother    Breast cancer Mother    Migraines Mother    Gout Mother    Heart disease Mother    Crohn's disease Mother    Hypertension Father    CAD Father    CVA Father    Heart disease Father    Sjogren's syndrome Sister    Celiac disease Sister    Rheum arthritis Maternal Grandmother    Autism Son    ADD / ADHD Son    Pancreatic cancer Maternal Grandfather    Breast cancer Maternal Aunt     Social History   Socioeconomic History   Marital status: Married    Spouse name: Not on file   Number of children: 1   Years of education: Assoc  Highest education level: Not on file  Occupational History   Occupation: nurse  Tobacco Use   Smoking status: Former    Packs/day: 1.00    Years: 10.00    Pack years: 10.00    Types: Cigarettes    Quit date: 04/04/2004    Years since quitting: 17.1   Smokeless tobacco: Never  Vaping Use   Vaping Use: Never used  Substance and Sexual Activity   Alcohol use: Yes    Comment: occ.   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[employer]   Married   1 son- born 2004 has autism   Enjoys exercise, Surveyor, mining with son   Drinks 4-5 cups of coffee a day       Right handed   Social Determinants of Health   Financial Resource Strain: Not on file  Food Insecurity: Not on file  Transportation Needs: Not on file  Physical Activity: Not on file  Stress: Not on file   Social Connections: Not on file  Intimate Partner Violence: Not on file    Outpatient Medications Prior to Visit  Medication Sig Dispense Refill   hydrOXYzine (VISTARIL) 25 MG capsule TAKE 1 CAPSULE BY MOUTH EVERY 8 HOURS AS NEEDED 30 capsule 0   SUMAtriptan (IMITREX) 100 MG tablet Take 1 tablet (100 mg total) by mouth at onset of migraine. May repeat in 2 hours if headache persists or recurs. Do not take more than 2 doses in 24 hours. 10 tablet 2   traZODone (DESYREL) 100 MG tablet TAKE TWO TABLETS BY MOUTH EVERY NIGHT AT BEDTIME 180 tablet 1   cyclobenzaprine (FLEXERIL) 10 MG tablet Take 1 tablet (10 mg total) by mouth at bedtime. 7 tablet 0   diclofenac (VOLTAREN) 75 MG EC tablet Take 1 tablet (75 mg total) by mouth 2 (two) times daily as needed. 20 tablet 0   fluticasone (FLONASE) 50 MCG/ACT nasal spray Place 2 sprays into both nostrils daily. 16 g 1   predniSONE (DELTASONE) 20 MG tablet Take 2 tablets (40 mg total) by mouth daily. 10 tablet 0   SYMBICORT 160-4.5 MCG/ACT inhaler INHALE 2 PUFFS INTO THE LUNGS TWO TIMES A DAY 10.2 g 2   No facility-administered medications prior to visit.    Allergies  Allergen Reactions   Effexor [Venlafaxine] Nausea Only    insomnia    Review of Systems  Constitutional:  Negative for fever.  HENT:  Negative for ear pain and hearing loss.        (-)nystagmus (-)adenopathy  Eyes:  Negative for blurred vision.  Respiratory:  Negative for cough, shortness of breath and wheezing.   Cardiovascular:  Negative for chest pain and leg swelling.  Gastrointestinal:  Negative for blood in stool, diarrhea, nausea and vomiting.  Genitourinary:  Negative for dysuria and frequency.  Musculoskeletal:  Positive for back pain and joint pain (right knee). Negative for myalgias.  Skin:  Negative for rash.  Neurological:  Negative for headaches.  Psychiatric/Behavioral:  Negative for depression. The patient is nervous/anxious.       Objective:    Physical  Exam Constitutional:      General: She is not in acute distress.    Appearance: Normal appearance. She is not ill-appearing.  HENT:     Head: Normocephalic and atraumatic.     Right Ear: External ear normal.  Left Ear: External ear normal.  Eyes:     Extraocular Movements: Extraocular movements intact.     Pupils: Pupils are equal, round, and reactive to light.  Cardiovascular:     Rate and Rhythm: Normal rate and regular rhythm.     Pulses: Normal pulses.     Heart sounds: Normal heart sounds. No murmur heard. Pulmonary:     Effort: Pulmonary effort is normal. No respiratory distress.     Breath sounds: Normal breath sounds. No wheezing or rhonchi.  Abdominal:     General: Bowel sounds are normal. There is no distension.     Palpations: Abdomen is soft.     Tenderness: There is no abdominal tenderness. There is no guarding or rebound.  Musculoskeletal:     Cervical back: Neck supple.  Lymphadenopathy:     Cervical: No cervical adenopathy.  Skin:    General: Skin is warm and dry.  Neurological:     Mental Status: She is alert and oriented to person, place, and time.  Psychiatric:        Behavior: Behavior normal.        Judgment: Judgment normal.    BP (!) 94/57 (BP Location: Right Arm, Patient Position: Sitting, Cuff Size: Large)    Pulse 91    Temp 98.4 F (36.9 C) (Oral)    Resp 16    Wt 228 lb (103.4 kg)    SpO2 98%    BMI 33.67 kg/m  Wt Readings from Last 3 Encounters:  06/01/21 228 lb (103.4 kg)  04/21/21 230 lb (104.3 kg)  04/21/21 233 lb 3.2 oz (105.8 kg)       Assessment & Plan:   Problem List Items Addressed This Visit       Unprioritized   SLE (systemic lupus erythematosus) ([place])    Has a follow up visit scheduled with Rheumatology.        Pain in right knee    Worsened by recent injury. Recommended short course of ibuprofen for right knee pain as well as low back pain. She will let me know if her symptoms worsen or if they do not improve.        Gastroesophageal reflux disease    Hx most consistent with GERD. Will give trial of omeprazole once daily. She is advised to send me a follow up message in 1 month to let me know if her symptoms are improving.       Relevant Medications   omeprazole (PRILOSEC) 40 MG capsule   Depression with anxiety    Stable/improved. Continue current meds. Commended her on remaining off of alcohol.       Other Visit Diagnoses     Need for hepatitis C screening test    -  Primary   Relevant Orders   Hepatitis C Antibody   Encounter for screening for HIV       Relevant Orders   HIV antibody (with reflex)      Encouraged pt to obtain updated bivalent covid booster.   I have discontinued [employer]. Pogorzelski's diclofenac, predniSONE, cyclobenzaprine, and Symbicort. I have also changed her fluticasone. Additionally, I am having her start on omeprazole. Lastly, I am having her maintain her SUMAtriptan, traZODone, and hydrOXYzine.  Meds ordered this encounter  Medications   omeprazole (PRILOSEC) 40 MG capsule    Sig: Take 1 capsule (40 mg total) by mouth daily.    Dispense:  30 capsule    Refill:  3    Order  Specific Question:   Supervising Provider    Answer:   Penni Homans A [[id number]]   fluticasone (FLONASE) 50 MCG/ACT nasal spray    Sig: Place 2 sprays into both nostrils daily as needed for allergies or rhinitis.    Dispense:  16 g    Refill:  1    Order Specific Question:   Supervising Provider    Answer:   Penni Homans A [[id number]]    I,Zite Okoli,acting as a scribe for Nance Pear, NP.,have documented all relevant documentation on the behalf of Nance Pear, NP,as directed by  Nance Pear, NP while in the presence of Nance Pear, NP.

## 2021-06-01 NOTE — Assessment & Plan Note (Signed)
Worsened by recent injury. Recommended short course of ibuprofen for right knee pain as well as low back pain. She will let me know if her symptoms worsen or if they do not improve.

## 2021-06-01 NOTE — Assessment & Plan Note (Signed)
Stable/improved. Continue current meds. Commended her on remaining off of alcohol.

## 2021-06-01 NOTE — Assessment & Plan Note (Signed)
Hx most consistent with GERD. Will give trial of omeprazole once daily. She is advised to send me a follow up message in 1 month to let me know if her symptoms are improving.

## 2021-06-01 NOTE — Assessment & Plan Note (Signed)
Has a follow up visit scheduled with Rheumatology.

## 2021-06-01 NOTE — Patient Instructions (Signed)
Please complete lab work prior to leaving.   

## 2021-06-02 ENCOUNTER — Ambulatory Visit: Payer: BC Managed Care – PPO | Admitting: Family

## 2021-06-02 DIAGNOSIS — Z713 Dietary counseling and surveillance: Secondary | ICD-10-CM | POA: Diagnosis not present

## 2021-06-02 LAB — HIV ANTIBODY (ROUTINE TESTING W REFLEX): HIV 1&2 Ab, 4th Generation: NONREACTIVE

## 2021-06-02 LAB — HEPATITIS C ANTIBODY
Hepatitis C Ab: NONREACTIVE
SIGNAL TO CUT-OFF: 0.04 (ref <1.00)

## 2021-06-05 ENCOUNTER — Encounter: Payer: Self-pay | Admitting: Family

## 2021-06-05 ENCOUNTER — Ambulatory Visit (INDEPENDENT_AMBULATORY_CARE_PROVIDER_SITE_OTHER): Payer: BC Managed Care – PPO | Admitting: Family

## 2021-06-05 ENCOUNTER — Emergency Department (HOSPITAL_BASED_OUTPATIENT_CLINIC_OR_DEPARTMENT_OTHER): Payer: BC Managed Care – PPO

## 2021-06-05 ENCOUNTER — Emergency Department (HOSPITAL_BASED_OUTPATIENT_CLINIC_OR_DEPARTMENT_OTHER)
Admission: EM | Admit: 2021-06-05 | Discharge: 2021-06-05 | Disposition: A | Discharge status: Home / Self Care | Payer: BC Managed Care – PPO | Attending: Emergency Medicine | Admitting: Emergency Medicine

## 2021-06-05 ENCOUNTER — Other Ambulatory Visit: Payer: Self-pay

## 2021-06-05 VITALS — BP 113/71 | HR 86 | Temp 97.9°F | Resp 16 | Ht 69.0 in | Wt 232.4 lb

## 2021-06-05 DIAGNOSIS — R1031 Right lower quadrant pain: Secondary | ICD-10-CM | POA: Diagnosis not present

## 2021-06-05 DIAGNOSIS — Z79899 Other long term (current) drug therapy: Secondary | ICD-10-CM | POA: Diagnosis not present

## 2021-06-05 DIAGNOSIS — R109 Unspecified abdominal pain: Secondary | ICD-10-CM

## 2021-06-05 DIAGNOSIS — K5900 Constipation, unspecified: Secondary | ICD-10-CM | POA: Insufficient documentation

## 2021-06-05 HISTORY — DX: Right lower quadrant pain: R10.31

## 2021-06-05 LAB — COMPREHENSIVE METABOLIC PANEL
ALT: 31 U/L (ref 0–44)
AST: 22 U/L (ref 15–41)
Albumin: 4.4 g/dL (ref 3.5–5.0)
Alkaline Phosphatase: 68 U/L (ref 38–126)
Anion gap: 9 (ref 5–15)
BUN: 19 mg/dL (ref 6–20)
CO2: 26 mmol/L (ref 22–32)
Calcium: 9.3 mg/dL (ref 8.9–10.3)
Chloride: 100 mmol/L (ref 98–111)
Creatinine, Ser: 0.7 mg/dL (ref 0.44–1.00)
GFR, Estimated: >60 mL/min (ref >60)
Glucose, Bld: 105 mg/dL — ABNORMAL HIGH (ref 70–99)
Potassium: 4.1 mmol/L (ref 3.5–5.1)
Sodium: 135 mmol/L (ref 135–145)
Total Bilirubin: 0.2 mg/dL — ABNORMAL LOW (ref 0.3–1.2)
Total Protein: 7.5 g/dL (ref 6.5–8.1)

## 2021-06-05 LAB — LIPASE, BLOOD: Lipase: 41 U/L (ref 11–51)

## 2021-06-05 LAB — CBC WITH DIFFERENTIAL/PLATELET
Abs Immature Granulocytes: 0.02 10*3/uL (ref 0.00–0.07)
Basophils Absolute: 0.1 10*3/uL (ref 0.0–0.1)
Basophils Relative: 1 %
Eosinophils Absolute: 0.1 10*3/uL (ref 0.0–0.5)
Eosinophils Relative: 1 %
HCT: 40.4 % (ref 36.0–46.0)
Hemoglobin: 13.3 g/dL (ref 12.0–15.0)
Immature Granulocytes: 0 %
Lymphocytes Relative: 29 %
Lymphs Abs: 2.1 10*3/uL (ref 0.7–4.0)
MCH: 28.5 pg (ref 26.0–34.0)
MCHC: 32.9 g/dL (ref 30.0–36.0)
MCV: 86.5 fL (ref 80.0–100.0)
Monocytes Absolute: 0.5 10*3/uL (ref 0.1–1.0)
Monocytes Relative: 7 %
Neutro Abs: 4.3 10*3/uL (ref 1.7–7.7)
Neutrophils Relative %: 62 %
Platelets: 324 10*3/uL (ref 150–400)
RBC: 4.67 MIL/uL (ref 3.87–5.11)
RDW: 12.5 % (ref 11.5–15.5)
WBC: 7.1 10*3/uL (ref 4.0–10.5)
nRBC: 0 % (ref 0.0–0.2)

## 2021-06-05 LAB — URINALYSIS, ROUTINE W REFLEX MICROSCOPIC
Bilirubin Urine: NEGATIVE
Glucose, UA: NEGATIVE mg/dL
Hgb urine dipstick: NEGATIVE
Ketones, ur: NEGATIVE mg/dL
Leukocytes,Ua: NEGATIVE
Nitrite: NEGATIVE
Protein, ur: NEGATIVE mg/dL
Specific Gravity, Urine: 1.02 (ref 1.005–1.030)
pH: 7.5 (ref 5.0–8.0)

## 2021-06-05 MED ORDER — KETOROLAC TROMETHAMINE 30 MG/ML IJ SOLN
30.0000 mg | Freq: Once | INTRAMUSCULAR | Status: AC
  Administered 2021-06-05: 30 mg via INTRAVENOUS
  Filled 2021-06-05: qty 1

## 2021-06-05 MED ORDER — HYDROMORPHONE HCL 1 MG/ML IJ SOLN
1.0000 mg | Freq: Once | INTRAMUSCULAR | Status: AC
  Administered 2021-06-05: 1 mg via INTRAVENOUS
  Filled 2021-06-05: qty 1

## 2021-06-05 MED ORDER — IOHEXOL 300 MG/ML  SOLN
100.0000 mL | Freq: Once | INTRAMUSCULAR | Status: AC | PRN
  Administered 2021-06-05: 100 mL via INTRAVENOUS

## 2021-06-05 MED ORDER — DICYCLOMINE HCL 20 MG PO TABS: 20.0000 mg | ORAL_TABLET | Freq: Two times a day (BID) | ORAL | 0 refills | Status: DC | PRN

## 2021-06-05 MED ORDER — DICYCLOMINE HCL 10 MG PO CAPS
20.0000 mg | ORAL_CAPSULE | Freq: Once | ORAL | Status: AC
  Administered 2021-06-05: 20 mg via ORAL
  Filled 2021-06-05: qty 2

## 2021-06-05 MED ORDER — ONDANSETRON HCL 4 MG/2ML IJ SOLN
4.0000 mg | Freq: Once | INTRAMUSCULAR | Status: AC | PRN
  Administered 2021-06-05: 4 mg via INTRAVENOUS
  Filled 2021-06-05: qty 2

## 2021-06-05 NOTE — Assessment & Plan Note (Addendum)
With concurrent hematochezia.  Has hx of microscopic colitis. Need to also evaluate for acute appendicitis. I advised the patient to proceed to the ED for further evaluation.  We also discussed importance of discontinuing use of NSAIDS.  Report given to ED physician on duty at the [facility].

## 2021-06-05 NOTE — Progress Notes (Signed)
Subjective:     Patient ID: Kim Pham, female    DOB: 1981/03/18, 41 y.o.   MRN: [id number]  Chief Complaint  Patient presents with   Back Pain    Here for Complains back pain right lower onset last night     Back Pain   Reports that she developed periumbilical pain last night, then she developed back pain. At [facility] she developed right lower abdominal pain. Then she had bm and saw blood in the commode x 1 this am and a little around lunch time.  Notes increased abdominal distention, no fever. Denies dysuria, frequency, hematuria.  BM was otherwise normal.    Notes hx of microscopic colitis.  Has been using a lot of nsaids for joint pain.  Health Maintenance Due  Topic Date Due   COVID-19 Vaccine (5 - Booster) 04/07/2020    Past Medical History:  Diagnosis Date   Anxiety    Arthritis    Chiari malformation    Chronic headaches    COVID    Endometriosis    GERD (gastroesophageal reflux disease)    tums otc, prevacid prn   Headache    Insomnia 08/19/2020   Low blood pressure    pt states history of low blood pressure-fainted 12/2010-instructed to move slowly   SLE (systemic lupus erythematosus) ([place]) 03/26/2016    Past Surgical History:  Procedure Laterality Date   ABDOMINAL HYSTERECTOMY  2007   CYSTOSCOPY  2007   DIAGNOSTIC LAPAROSCOPY  2005   ROTATOR CUFF REPAIR  2010   right shoulder   SALPINGOOPHORECTOMY  04/16/2011   Procedure: SALPINGO OOPHERECTOMY;  Surgeon: Lovenia Kim, MD;  Location: [facility];  Service: Gynecology;  Laterality: Right;   skull decompression surgery 2005      Family History  Problem Relation Age of Onset   Hypertension Mother    CAD Mother    Breast cancer Mother    Migraines Mother    Gout Mother    Heart disease Mother    Crohn's disease Mother    Hypertension Father    CAD Father    CVA Father    Heart disease Father    Sjogren's syndrome Sister    Celiac disease Sister    Rheum arthritis Maternal Grandmother     Autism Son    ADD / ADHD Son    Pancreatic cancer Maternal Grandfather    Breast cancer Maternal Aunt     Social History   Socioeconomic History   Marital status: Married    Spouse name: Not on file   Number of children: 1   Years of education: Assoc   Highest education level: Not on file  Occupational History   Occupation: nurse  Tobacco Use   Smoking status: Former    Packs/day: 1.00    Years: 10.00    Pack years: 10.00    Types: Cigarettes    Quit date: 04/04/2004    Years since quitting: 17.1   Smokeless tobacco: Never  Vaping Use   Vaping Use: Never used  Substance and Sexual Activity   Alcohol use: Yes    Comment: occ.   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[employer]   Married   1 son- born 2004 has autism   Enjoys exercise, special olympics with son   Drinks 4-5 cups of coffee a day  Right handed   Social Determinants of Health   Financial Resource Strain: Not on file  Food Insecurity: Not on file  Transportation Needs: Not on file  Physical Activity: Not on file  Stress: Not on file  Social Connections: Not on file  Intimate Partner Violence: Not on file    Outpatient Medications Prior to Visit  Medication Sig Dispense Refill   fluticasone (FLONASE) 50 MCG/ACT nasal spray Place 2 sprays into both nostrils daily as needed for allergies or rhinitis. 16 g 1   hydrOXYzine (VISTARIL) 25 MG capsule TAKE 1 CAPSULE BY MOUTH EVERY 8 HOURS AS NEEDED 30 capsule 0   omeprazole (PRILOSEC) 40 MG capsule Take 1 capsule (40 mg total) by mouth daily. 30 capsule 3   SUMAtriptan (IMITREX) 100 MG tablet Take 1 tablet (100 mg total) by mouth at onset of migraine. May repeat in 2 hours if headache persists or recurs. Do not take more than 2 doses in 24 hours. 10 tablet 2   traZODone (DESYREL) 100 MG tablet TAKE TWO TABLETS BY MOUTH EVERY NIGHT AT BEDTIME 180 tablet 1    No facility-administered medications prior to visit.    Allergies  Allergen Reactions   Effexor [Venlafaxine] Nausea Only    insomnia    Review of Systems  Musculoskeletal:  Positive for back pain.      Objective:    Physical Exam Constitutional:      General: She is not in acute distress.    Appearance: She is well-developed. She is ill-appearing.  HENT:     Head: Normocephalic and atraumatic.     Right Ear: External ear normal.     Left Ear: External ear normal.  Eyes:     General: No scleral icterus. Neck:     Thyroid: No thyromegaly.  Cardiovascular:     Rate and Rhythm: Normal rate and regular rhythm.     Heart sounds: Normal heart sounds. No murmur heard. Pulmonary:     Effort: Pulmonary effort is normal. No respiratory distress.     Breath sounds: Normal breath sounds. No wheezing.  Abdominal:     General: Bowel sounds are decreased.     Palpations: Abdomen is soft.     Tenderness: There is abdominal tenderness in the right lower quadrant and periumbilical area. There is right CVA tenderness. There is no left CVA tenderness.  Musculoskeletal:     Cervical back: Neck supple.  Skin:    General: Skin is warm and dry.  Neurological:     Mental Status: She is alert and oriented to person, place, and time.  Psychiatric:        Mood and Affect: Mood normal.        Behavior: Behavior normal.        Thought Content: Thought content normal.        Judgment: Judgment normal.    BP 113/71 (BP Location: Right Arm, Patient Position: Sitting, Cuff Size: Normal)    Pulse 86    Temp 97.9 F (36.6 C) (Oral)    Resp 16    Ht 5\' 9"  (1.753 m)    Wt 232 lb 6.4 oz (105.4 kg)    SpO2 99%    BMI 34.32 kg/m  Wt Readings from Last 3 Encounters:  06/05/21 230 lb (104.3 kg)  06/05/21 232 lb 6.4 oz (105.4 kg)  06/01/21 228 lb (103.4 kg)       Assessment & Plan:   Problem List Items Addressed This Visit  Unprioritized   Right lower quadrant abdominal pain - Primary     With concurrent hematochezia.  Has hx of microscopic colitis. Need to also evaluate for acute appendicitis. I advised the patient to proceed to the ED for further evaluation.  We also discussed importance of discontinuing use of NSAIDS.  Report given to ED physician on duty at the [facility].        I am having Adelai Achey. Mohamed maintain her SUMAtriptan, traZODone, hydrOXYzine, omeprazole, and fluticasone.  No orders of the defined types were placed in this encounter.

## 2021-06-05 NOTE — Telephone Encounter (Signed)
Patient scheduled for today at 4pm.

## 2021-06-05 NOTE — ED Notes (Signed)
Patient transported to CT 

## 2021-06-05 NOTE — Telephone Encounter (Signed)
Lets see if she can come in for an appointment today at 1:20 or 4:00 pm please.

## 2021-06-05 NOTE — Patient Instructions (Signed)
Please head down the ER on the first floor for further evaluation.

## 2021-06-05 NOTE — Discharge Instructions (Addendum)
I recommend a bowel cleanout with over-the-counter magnesium citrate and a suppository (eg glycerin), or over-the-counter Dulcolax by mouth with a suppository.  You can use heating packs on your abdomen for cramping abdominal pain.  You can also take ibuprofen and Tylenol as needed.  I prescribed a medicine called Bentyl which is an antispasm medicine and may also help with abdominal pain.  If your pain is getting worse, or if you have persistent vomiting cannot keep down water, or you cannot pass gas or have a bowel movement, you need to return to the emergency department.  These may be signs or symptoms of a more serious developing condition which was not clear on your initial presentation

## 2021-06-05 NOTE — ED Provider Notes (Signed)
[place] EMERGENCY DEPARTMENT Provider Note   CSN: [id number] Arrival date & time: 06/05/21  1614     History  Chief Complaint  Patient presents with   Abdominal Pain    Kim Pham is a 41 y.o. female presenting to the ED with abdominal pain. Onset last night, paraumbilical pain, now migrated to RLQ.  N/v and diarrhea onset today.    Hx of partial hysterectomy (uterus removed, ovaries remained)  Hx of microscopic colitis diagnosed with colonoscopy in 2019  HPI     Home Medications Prior to Admission medications   Medication Sig Start Date End Date Taking? Authorizing Provider  dicyclomine (BENTYL) 20 MG tablet Take 1 tablet (20 mg total) by mouth 2 (two) times daily as needed for up to 20 doses for spasms. 06/05/21  Yes Wyvonnia Dusky, MD  fluticasone [facility]) 50 MCG/ACT nasal spray Place 2 sprays into both nostrils daily as needed for allergies or rhinitis. 06/01/21   Debbrah Alar, NP  hydrOXYzine (VISTARIL) 25 MG capsule TAKE 1 CAPSULE BY MOUTH EVERY 8 HOURS AS NEEDED 05/29/21   Debbrah Alar, NP  omeprazole (PRILOSEC) 40 MG capsule Take 1 capsule (40 mg total) by mouth daily. 06/01/21   Debbrah Alar, NP  SUMAtriptan (IMITREX) 100 MG tablet Take 1 tablet (100 mg total) by mouth at onset of migraine. May repeat in 2 hours if headache persists or recurs. Do not take more than 2 doses in 24 hours. 06/29/17   Melvenia Beam, MD  traZODone (DESYREL) 100 MG tablet TAKE TWO TABLETS BY MOUTH EVERY NIGHT AT BEDTIME 04/22/21   Debbrah Alar, NP      Allergies    Effexor [venlafaxine]    Review of Systems   Review of Systems  Physical Exam Updated Vital Signs BP 120/77 (BP Location: Left Arm)    Pulse 77    Temp 97.9 F (36.6 C) (Oral)    Resp 18    Ht 5\' 9"  (1.753 m)    Wt 104.3 kg    SpO2 98%    BMI 33.97 kg/m  Physical Exam Constitutional:      General: She is in acute distress.  HENT:     Head: Normocephalic and atraumatic.   Eyes:     Conjunctiva/sclera: Conjunctivae normal.     Pupils: Pupils are equal, round, and reactive to light.  Cardiovascular:     Rate and Rhythm: Normal rate and regular rhythm.  Pulmonary:     Effort: Pulmonary effort is normal. No respiratory distress.  Abdominal:     General: There is no distension.     Tenderness: There is abdominal tenderness. There is no rebound. Positive signs include McBurney's sign. Negative signs include Murphy's sign.  Skin:    General: Skin is warm and dry.  Neurological:     General: No focal deficit present.     Mental Status: She is alert. Mental status is at baseline.  Psychiatric:        Mood and Affect: Mood normal.        Behavior: Behavior normal.    ED Results / Procedures / Treatments   Labs (all labs ordered are listed, but only abnormal results are displayed) Labs Reviewed  COMPREHENSIVE METABOLIC PANEL - Abnormal; Notable for the following components:      Result Value   Glucose, Bld 105 (*)    Total Bilirubin 0.2 (*)    All other components within normal limits  LIPASE, BLOOD  URINALYSIS, ROUTINE W  REFLEX MICROSCOPIC  CBC WITH DIFFERENTIAL/PLATELET    EKG None  Radiology CT ABDOMEN PELVIS W CONTRAST  Result Date: 06/05/2021 CLINICAL DATA:  Right lower quadrant abdominal pain. Nausea and vomiting for 2 days. Evaluate for appendicitis versus ruptured cyst. History of partial hysterectomy. EXAM: CT ABDOMEN AND PELVIS WITH CONTRAST TECHNIQUE: Multidetector CT imaging of the abdomen and pelvis was performed using the standard protocol following bolus administration of intravenous contrast. RADIATION DOSE REDUCTION: This exam was performed according to the departmental dose-optimization program which includes automated exposure control, adjustment of the mA and/or kV according to patient size and/or use of iterative reconstruction technique. CONTRAST:  177mL OMNIPAQUE IOHEXOL 300 MG/ML  SOLN COMPARISON:  Abdominal ultrasound 07/22/2017  CT abdomen and pelvis 01/11/2015 FINDINGS: Lower chest: Mild curvilinear subsegmental atelectasis within the right lower lobe. Heart size is mildly enlarged. Hepatobiliary: The liver demonstrates smooth contours. No focal hepatic lesion is seen. The gallbladder is unremarkable. No intrahepatic or extrahepatic biliary ductal dilatation. Pancreas: No mass or inflammatory change is seen within the pancreas. Spleen: Unremarkable. Adrenals/Urinary Tract: Normal adrenals. The kidneys enhance uniformly are symmetric in size without hydronephrosis. The urinary bladder is unremarkable. Stomach/Bowel: High-grade stool is seen throughout all segments of the colon. Fecal material is also seen within the distal ileum similar to prior. This suggests chronic slow bowel transit. Normal appendix. No dilated loops of bowel to indicate bowel obstruction. Vascular/Lymphatic: No abdominal aortic aneurysm. The major intra-abdominal aortic branch vessels are patent. No mesenteric retroperitoneal or pelvic pathologically enlarged lymph nodes by CT criteria. Reproductive: Status post partial hysterectomy. There are numerous vascular phleboliths seen around the cervix. This is not significantly changed from prior. There is a left adnexal low-density 3.1 cm lesion, likely a mildly complex cyst with internal density of 25 Hounsfield units. Other: Tiny fat containing umbilical hernia. No free air or free fluid within the abdomen or pelvis. Musculoskeletal: Incidental note of normal variant T12 limbus vertebra. IMPRESSION:: IMPRESSION: 1. High-grade stool is seen throughout all segments of the colon compatible with constipation. 2. Normal appendix. 3. There is a likely cyst measuring up to 3.1 cm within the left adnexa with intermediate density mild complexity. No ruptured adnexal cyst is seen. Electronically Signed   By: Yvonne Kendall M.D.   On: 06/05/2021 18:28    Procedures Procedures    Medications Ordered in ED Medications   ondansetron (ZOFRAN) injection 4 mg (4 mg Intravenous Given 06/05/21 1635)  HYDROmorphone (DILAUDID) injection 1 mg (1 mg Intravenous Given 06/05/21 1752)  ketorolac (TORADOL) 30 MG/ML injection 30 mg (30 mg Intravenous Given 06/05/21 1750)  iohexol (OMNIPAQUE) 300 MG/ML solution 100 mL (100 mLs Intravenous Contrast Given 06/05/21 1800)  dicyclomine (BENTYL) capsule 20 mg (20 mg Oral Given 06/05/21 1918)    ED Course/ Medical Decision Making/ A&P Clinical Course as of 06/06/21 0858  Fri Jun 05, 2021  1838 IMPRESSION: 1. High-grade stool is seen throughout all segments of the colon compatible with constipation. 2. Normal appendix. 3. There is a likely cyst measuring up to 3.1 cm within the left adnexa with intermediate density mild complexity. No ruptured adnexal cyst is seen. [MT]  1902 Pain is improved.  Discussed the patient and her husband at bedside her work-up.  Her labs, CT, vital signs do not show any emergency process.  I do see evidence of constipation.  Her husband reports that she does complain of chronic abdominal pains, but is never had them this severe.  We can try Bentyl for suspected GI  spasm, but I recommended rigorous GI cleanout, with suppository and oral laxative.  They verbalized understanding. [MT]    Clinical Course User Index [MT] Shinita Mac, Carola Rhine, MD                           Medical Decision Making Amount and/or Complexity of Data Reviewed Labs: ordered. Radiology: ordered.  Risk Prescription drug management.   This patient presents to the ED with concern for abdominal pain.. This involves an extensive number of treatment options, and is a complaint that carries with it a high risk of complications and morbidity.  The differential diagnosis includes appendicitis versus bowel obstruction versus colitis versus ruptured cyst versus other.  I ordered and personally interpreted labs.  The pertinent results include:  WBC wnl; CMP unremarkable; UA without sign of  infection  I ordered imaging studies including CT abdomen pelvis with contrast I independently visualized and interpreted imaging which showed large stool burden, normal appendix; no ovarian enlargement noted; left sided ovarian cyst I agree with the radiologist interpretation   Lower suspicion for ovarian torsion clinically, given her symptoms are predominantly GI related, and no ovarian enlargement noted on CT scan-  I did not feel pelvic ultrasound was emergently indicated  The patient was maintained on a cardiac monitor.  I personally viewed and interpreted the cardiac monitored which showed an underlying rhythm of: NSR  I ordered medication including IV dilaudid, IV zofran, IV toradol for pain and nausea I have reviewed the patients home medicines and have made adjustments as needed  After the interventions noted above, I reevaluated the patient and found that they have: improved   Dispostion:  After consideration of the diagnostic results and the patients response to treatment, I feel that the patent would benefit from outpatient PCP f/u; bowel cleanout regimen.         Final Clinical Impression(s) / ED Diagnoses Final diagnoses:  Abdominal pain, unspecified abdominal location  Constipation, unspecified constipation type    Rx / DC Orders ED Discharge Orders          Ordered    dicyclomine (BENTYL) 20 MG tablet  2 times daily PRN        06/05/21 1925              Wyvonnia Dusky, MD 06/06/21 0900

## 2021-06-05 NOTE — ED Triage Notes (Signed)
Reports RLQ abdominal pain that initially started in the center of her stomach last night then radiated to the right flank and around to lower abdomen.  Also endorses n/v/d.  Reports blood in stool this morning that was bright red with clots.

## 2021-06-06 ENCOUNTER — Telehealth: Payer: Self-pay | Admitting: Family

## 2021-06-06 NOTE — Telephone Encounter (Signed)
See mychart.  

## 2021-06-09 ENCOUNTER — Ambulatory Visit (INDEPENDENT_AMBULATORY_CARE_PROVIDER_SITE_OTHER): Payer: BC Managed Care – PPO | Admitting: Family

## 2021-06-09 VITALS — BP 117/70 | HR 93 | Temp 98.3°F | Resp 16 | Wt 227.0 lb

## 2021-06-09 DIAGNOSIS — K625 Hemorrhage of anus and rectum: Secondary | ICD-10-CM | POA: Diagnosis not present

## 2021-06-09 DIAGNOSIS — R232 Flushing: Secondary | ICD-10-CM | POA: Diagnosis not present

## 2021-06-09 DIAGNOSIS — K5900 Constipation, unspecified: Secondary | ICD-10-CM

## 2021-06-09 DIAGNOSIS — N9489 Other specified conditions associated with female genital organs and menstrual cycle: Secondary | ICD-10-CM | POA: Diagnosis not present

## 2021-06-09 LAB — ESTRADIOL: Estradiol: 75 pg/mL

## 2021-06-09 MED ORDER — GABAPENTIN 300 MG PO CAPS: 300.0000 mg | ORAL_CAPSULE | Freq: Every day | ORAL | 1 refills | Status: DC

## 2021-06-09 NOTE — Progress Notes (Signed)
Subjective:     Patient ID: Kim Pham, female    DOB: [birth year], 41 y.o.   MRN: [id number]  Chief Complaint  Patient presents with   Abdominal Pain    "Doing much better"    HPI Patient is in today for ED follow up. We last saw he ron 2/17//23. She had severe RLQ pain at that time as well as an episode of BRBPR. We sent her to the ED.  She underwent CT which showed severe constipation.  Pt was discharged home and took a dose of mag citrate.  She reports good response to the mag citrate. She has been having regular daily BM's since then and notes near resolution of her abdominal pain. She has not seen any further BRBPR.   There was an incidental finding of a mildly complex cyst in the left adnexa.   She complains of hot flashes. These interfere with her ability to sleep. States that she has not had a menstrual cycle since 2007. Not currently seeing GYN.    Health Maintenance Due  Topic Date Due   COVID-19 Vaccine (5 - Booster) 04/07/2020    Past Medical History:  Diagnosis Date   Anxiety    Arthritis    Chiari malformation    Chronic headaches    COVID    Endometriosis    GERD (gastroesophageal reflux disease)    tums otc, prevacid prn   Headache    Insomnia 08/19/2020   Low blood pressure    pt states history of low blood pressure-fainted 12/2010-instructed to move slowly   SLE (systemic lupus erythematosus) ([place]) 03/26/2016    Past Surgical History:  Procedure Laterality Date   ABDOMINAL HYSTERECTOMY  2007   CYSTOSCOPY  2007   DIAGNOSTIC LAPAROSCOPY  2005   ROTATOR CUFF REPAIR  2010   right shoulder   SALPINGOOPHORECTOMY  04/16/2011   Procedure: SALPINGO OOPHERECTOMY;  Surgeon: Lovenia Kim, MD;  Location: [facility];  Service: Gynecology;  Laterality: Right;   skull decompression surgery 2005      Family History  Problem Relation Age of Onset   Hypertension Mother    CAD Mother    Breast cancer Mother    Migraines Mother    Gout Mother    Heart  disease Mother    Crohn's disease Mother    Hypertension Father    CAD Father    CVA Father    Heart disease Father    Sjogren's syndrome Sister    Celiac disease Sister    Rheum arthritis Maternal Grandmother    Autism Son    ADD / ADHD Son    Pancreatic cancer Maternal Grandfather    Breast cancer Maternal Aunt     Social History   Socioeconomic History   Marital status: Married    Spouse name: Not on file   Number of children: 1   Years of education: Assoc   Highest education level: Not on file  Occupational History   Occupation: nurse  Tobacco Use   Smoking status: Former    Packs/day: 1.00    Years: 10.00    Pack years: 10.00    Types: Cigarettes    Quit date: 04/04/2004    Years since quitting: 17.1   Smokeless tobacco: Never  Vaping Use   Vaping Use: Never used  Substance and Sexual Activity   Alcohol use: Yes    Comment: occ.   Drug use: No   Sexual activity: Not on file  Other  Topics Concern   Not on file  Social History Narrative   Works as an LPN @ [employer]   Married   1 son- born 2004 has autism   Enjoys exercise, Surveyor, mining with son   Drinks 4-5 cups of coffee a day       Right handed   Social Determinants of Health   Financial Resource Strain: Not on file  Food Insecurity: Not on file  Transportation Needs: Not on file  Physical Activity: Not on file  Stress: Not on file  Social Connections: Not on file  Intimate Partner Violence: Not on file    Outpatient Medications Prior to Visit  Medication Sig Dispense Refill   dicyclomine (BENTYL) 20 MG tablet Take 1 tablet (20 mg total) by mouth 2 (two) times daily as needed for up to 20 doses for spasms. 20 tablet 0   fluticasone (FLONASE) 50 MCG/ACT nasal spray Place 2 sprays into both nostrils daily as needed for allergies or rhinitis. 16 g 1   hydrOXYzine (VISTARIL) 25 MG capsule TAKE 1 CAPSULE BY MOUTH EVERY 8 HOURS AS NEEDED 30 capsule 0   omeprazole (PRILOSEC) 40 MG capsule  Take 1 capsule (40 mg total) by mouth daily. 30 capsule 3   SUMAtriptan (IMITREX) 100 MG tablet Take 1 tablet (100 mg total) by mouth at onset of migraine. May repeat in 2 hours if headache persists or recurs. Do not take more than 2 doses in 24 hours. 10 tablet 2   traZODone (DESYREL) 100 MG tablet TAKE TWO TABLETS BY MOUTH EVERY NIGHT AT BEDTIME 180 tablet 1   No facility-administered medications prior to visit.    Allergies  Allergen Reactions   Effexor [Venlafaxine] Nausea Only    insomnia    ROS See HPI    Objective:    Physical Exam Constitutional:      General: She is not in acute distress.    Appearance: Normal appearance. She is well-developed.  HENT:     Head: Normocephalic and atraumatic.     Right Ear: External ear normal.     Left Ear: External ear normal.  Eyes:     General: No scleral icterus. Neck:     Thyroid: No thyromegaly.  Cardiovascular:     Rate and Rhythm: Normal rate and regular rhythm.     Heart sounds: Normal heart sounds. No murmur heard. Pulmonary:     Effort: Pulmonary effort is normal. No respiratory distress.     Breath sounds: Normal breath sounds. No wheezing.  Abdominal:     General: Bowel sounds are normal.     Palpations: Abdomen is soft. There is no mass.     Tenderness: There is no abdominal tenderness.  Musculoskeletal:     Cervical back: Neck supple.  Skin:    General: Skin is warm and dry.  Neurological:     Mental Status: She is alert and oriented to person, place, and time.  Psychiatric:        Mood and Affect: Mood normal.        Behavior: Behavior normal.        Thought Content: Thought content normal.        Judgment: Judgment normal.    BP 117/70 (BP Location: Right Arm, Patient Position: Sitting, Cuff Size: Large)    Pulse 93    Temp 98.3 F (36.8 C) (Oral)    Resp 16    Wt 227 lb (103 kg)    SpO2 100%  BMI 33.52 kg/m  Wt Readings from Last 3 Encounters:  06/09/21 227 lb (103 kg)  06/05/21 230 lb (104.3 kg)   06/05/21 232 lb 6.4 oz (105.4 kg)       Assessment & Plan:   Problem List Items Addressed This Visit       Unprioritized   Rectal bleeding - Primary    X 1. Will refer back to GI for evaluation. She has a hx of microscopic colitis.       Relevant Orders   Ambulatory referral to Gastroenterology   Hot flashes    Trial of gabapentin 300mg  HS.  We did obtain hormonal studies which do not indicate menopause. Given amenorrhea, will also refer to GYN for further evaluation. See phone note.      Relevant Medications   gabapentin (NEURONTIN) 300 MG capsule   Other Relevant Orders   Estradiol (Completed)   FSH (Completed)   LH (Completed)   Constipation    Discussed importance of high fiber diet, increased water intake and addition of daily fiber supplement.       Adnexal mass    New. Will obtain pelvic US for further evaluation.       Relevant Orders   US Pelvis Complete    I am having Kim Pham. Plunk start on gabapentin. I am also having her maintain her SUMAtriptan, traZODone, hydrOXYzine, omeprazole, fluticasone, and dicyclomine.  Meds ordered this encounter  Medications   gabapentin (NEURONTIN) 300 MG capsule    Sig: Take 1 capsule (300 mg total) by mouth at bedtime.    Dispense:  90 capsule    Refill:  1    Order Specific Question:   Supervising Provider    Answer:   Penni Homans A [[id number]]

## 2021-06-09 NOTE — Patient Instructions (Signed)
Please complete lab work prior to leaving.   

## 2021-06-10 LAB — FOLLICLE STIMULATING HORMONE: FSH: 8.7 m[IU]/mL

## 2021-06-10 LAB — LUTEINIZING HORMONE: LH: 4.3 m[IU]/mL

## 2021-06-11 ENCOUNTER — Telehealth: Payer: Self-pay | Admitting: Family

## 2021-06-11 DIAGNOSIS — K625 Hemorrhage of anus and rectum: Secondary | ICD-10-CM

## 2021-06-11 DIAGNOSIS — N9489 Other specified conditions associated with female genital organs and menstrual cycle: Secondary | ICD-10-CM | POA: Insufficient documentation

## 2021-06-11 DIAGNOSIS — R232 Flushing: Secondary | ICD-10-CM

## 2021-06-11 DIAGNOSIS — K5900 Constipation, unspecified: Secondary | ICD-10-CM | POA: Insufficient documentation

## 2021-06-11 DIAGNOSIS — N912 Amenorrhea, unspecified: Secondary | ICD-10-CM

## 2021-06-11 HISTORY — DX: Flushing: R23.2

## 2021-06-11 HISTORY — DX: Constipation, unspecified: K59.00

## 2021-06-11 HISTORY — DX: Hemorrhage of anus and rectum: K62.5

## 2021-06-11 NOTE — Assessment & Plan Note (Signed)
Trial of gabapentin 300mg  HS.  We did obtain hormonal studies which do not indicate menopause. Given amenorrhea, will also refer to GYN for further evaluation. See phone note.

## 2021-06-11 NOTE — Telephone Encounter (Signed)
See mychart.  

## 2021-06-11 NOTE — Assessment & Plan Note (Signed)
New. Will obtain pelvic US for further evaluation.

## 2021-06-11 NOTE — Assessment & Plan Note (Signed)
Discussed importance of high fiber diet, increased water intake and addition of daily fiber supplement.

## 2021-06-11 NOTE — Assessment & Plan Note (Signed)
X 1. Will refer back to GI for evaluation. She has a hx of microscopic colitis.

## 2021-06-11 NOTE — Addendum Note (Signed)
Addended by: Debbrah Alar on: 06/11/2021 09:55 AM   Modules accepted: Orders

## 2021-06-16 ENCOUNTER — Ambulatory Visit (HOSPITAL_BASED_OUTPATIENT_CLINIC_OR_DEPARTMENT_OTHER)
Admission: RE | Admit: 2021-06-16 | Discharge: 2021-06-16 | Disposition: A | Discharge status: Home / Self Care | Payer: BC Managed Care – PPO | Source: Ambulatory Visit | Attending: Family | Admitting: Family

## 2021-06-16 ENCOUNTER — Other Ambulatory Visit: Payer: Self-pay

## 2021-06-16 DIAGNOSIS — N83202 Unspecified ovarian cyst, left side: Secondary | ICD-10-CM | POA: Diagnosis not present

## 2021-06-16 DIAGNOSIS — N9489 Other specified conditions associated with female genital organs and menstrual cycle: Secondary | ICD-10-CM | POA: Diagnosis not present

## 2021-06-16 DIAGNOSIS — Z90710 Acquired absence of both cervix and uterus: Secondary | ICD-10-CM | POA: Diagnosis not present

## 2021-06-17 ENCOUNTER — Ambulatory Visit (INDEPENDENT_AMBULATORY_CARE_PROVIDER_SITE_OTHER): Payer: BC Managed Care – PPO | Admitting: Family Medicine

## 2021-06-17 ENCOUNTER — Encounter: Payer: Self-pay | Admitting: Family Medicine

## 2021-06-17 VITALS — BP 115/52 | HR 85 | Ht 69.0 in | Wt 228.0 lb

## 2021-06-17 DIAGNOSIS — Z8041 Family history of malignant neoplasm of ovary: Secondary | ICD-10-CM | POA: Diagnosis not present

## 2021-06-17 DIAGNOSIS — N951 Menopausal and female climacteric states: Secondary | ICD-10-CM

## 2021-06-17 DIAGNOSIS — Z803 Family history of malignant neoplasm of breast: Secondary | ICD-10-CM | POA: Diagnosis not present

## 2021-06-17 DIAGNOSIS — Z8481 Family history of carrier of genetic disease: Secondary | ICD-10-CM

## 2021-06-17 DIAGNOSIS — N83202 Unspecified ovarian cyst, left side: Secondary | ICD-10-CM

## 2021-06-17 HISTORY — DX: Family history of carrier of genetic disease: Z84.81

## 2021-06-17 HISTORY — DX: Menopausal and female climacteric states: N95.1

## 2021-06-17 HISTORY — DX: Unspecified ovarian cyst, left side: N83.202

## 2021-06-17 NOTE — Progress Notes (Signed)
? ?Subjective:  ? ? Patient ID: Kim Pham, female    DOB: 1980-05-21, 41 y.o.   MRN: [id number] ? ?HPI ?41 year old patient referred for flashes, left lower quadrant pain.  Her left lower quadrant pain has been ongoing for several months now and has been worsening.  Her pain radiates into her back at times.  She has been taking ibuprofen 800 mg 3-4 times a day and using a heating pad at night.  Most recently, her PCP has placed her on gabapentin 300 mg at night.  She is increased this to 600 at night.  Gabapentin is mostly to help with hot flashes, night sweats, and sleep disturbances.  Due to the abdominal pain, the patient went to the emergency department on 2/17.  She had a CT scan which showed a 3 cm left ovarian cyst.  She had an ultrasound done yesterday to follow this. ? ?The patient is on her feet a lot during the day, so she does have some pain during the day as well. ? ?She does have a GYN surgical history: She had a total laparoscopic hysterectomy, which included her cervix when she was [age] years old.  At that time, she was diagnosed with severe endometriosis.  She then had a right oophorectomy with lysis of adhesions and removal of endometriosis in 2012 due to pain and cysts on her right ovary.  This was done robotically by Dr. Ronita Hipps (op note in [place]). ? ?She does have a family history of BRCA positive breast cancer in her mom.  She has never been tested.  Additionally, her sister had ovarian cancer. ? ?Review of Systems ? ?   ?Objective:  ? Physical Exam ?Vitals reviewed.  ?Constitutional:   ?   Appearance: Normal appearance.  ?Cardiovascular:  ?   Rate and Rhythm: Normal rate.  ?   Pulses: Normal pulses.  ?Abdominal:  ?   General: Abdomen is flat. There is no distension.  ?   Palpations: Abdomen is soft.  ?   Tenderness: There is abdominal tenderness (LLQ).  ?Neurological:  ?   Mental Status: She is alert.  ?Psychiatric:     ?   Mood and Affect: Mood normal.     ?   Behavior: Behavior normal.      ?   Thought Content: Thought content normal.     ?   Judgment: Judgment normal.  ? ?I independently reviewed the images of the ultrasound that she had done yesterday.  At the time of our visit, the final report was not present.  The ultrasound showed 2 x 3 cm complex ovarian cyst with multi septations.  The cyst was anechoic with no distinct abnormal color flow. ? ?   ?Assessment & Plan:  ?1. Cyst of left ovary ?Obtain CA125.  Continue with NSAIDs for pain control.  If this is positive, then we will proceed with MRI.  At the patient is having a lot of vasomotor symptoms, left oophorectomy is a possibility as it continues to be symptomatic.  Will refer to one of my partners for surgical consultation. ?- CA 125 ? ?2. Vasomotor symptoms due to menopause ?Due to family history and positive BRCA family history, I am hesitant to do any sort of hormone replacement therapy.  The patient is in agreement.  Patient is currently on gabapentin, which is helping.  Could also add in paroxetine or propranolol to help with her vasomotor symptoms. ? ?3. Family history of BRCA gene mutation ?I discussed BRCA gene  testing with the patient. ? ?

## 2021-06-18 ENCOUNTER — Encounter: Payer: Self-pay | Admitting: Family Medicine

## 2021-06-18 LAB — CA 125: Cancer Antigen (CA) 125: 11.6 U/mL (ref 0.0–38.1)

## 2021-06-19 ENCOUNTER — Encounter: Payer: Self-pay | Admitting: Family

## 2021-06-19 DIAGNOSIS — N9489 Other specified conditions associated with female genital organs and menstrual cycle: Secondary | ICD-10-CM

## 2021-06-19 DIAGNOSIS — Z713 Dietary counseling and surveillance: Secondary | ICD-10-CM | POA: Diagnosis not present

## 2021-06-22 ENCOUNTER — Encounter: Payer: Self-pay | Admitting: General Practice

## 2021-06-22 MED ORDER — PAROXETINE HCL 10 MG PO TABS: 20.0000 mg | ORAL_TABLET | Freq: Every day | ORAL | 3 refills | Status: DC

## 2021-06-22 MED ORDER — DICLOFENAC SODIUM 50 MG PO TBEC
50.0000 mg | DELAYED_RELEASE_TABLET | Freq: Three times a day (TID) | ORAL | 3 refills | Status: DC | PRN
Start: 2021-06-22 — End: 2021-09-04

## 2021-06-22 NOTE — Addendum Note (Signed)
Addended by: Debbrah Alar on: 06/22/2021 01:09 PM ? ? Modules accepted: Orders ? ?

## 2021-06-23 MED ORDER — ALPRAZOLAM 0.5 MG PO TABS: ORAL_TABLET | ORAL | 0 refills | Status: DC

## 2021-06-23 NOTE — Addendum Note (Signed)
Addended by: Debbrah Alar on: 06/23/2021 07:14 AM ? ? Modules accepted: Orders ? ?

## 2021-06-24 ENCOUNTER — Other Ambulatory Visit: Payer: Self-pay

## 2021-06-24 ENCOUNTER — Ambulatory Visit (HOSPITAL_BASED_OUTPATIENT_CLINIC_OR_DEPARTMENT_OTHER)
Admission: RE | Admit: 2021-06-24 | Discharge: 2021-06-24 | Disposition: A | Discharge status: Home / Self Care | Payer: BC Managed Care – PPO | Source: Ambulatory Visit | Attending: Family | Admitting: Family

## 2021-06-24 DIAGNOSIS — N9489 Other specified conditions associated with female genital organs and menstrual cycle: Secondary | ICD-10-CM | POA: Diagnosis not present

## 2021-06-24 DIAGNOSIS — N8302 Follicular cyst of left ovary: Secondary | ICD-10-CM | POA: Diagnosis not present

## 2021-06-24 DIAGNOSIS — M7989 Other specified soft tissue disorders: Secondary | ICD-10-CM | POA: Diagnosis not present

## 2021-06-24 DIAGNOSIS — R1900 Intra-abdominal and pelvic swelling, mass and lump, unspecified site: Secondary | ICD-10-CM | POA: Diagnosis not present

## 2021-06-24 DIAGNOSIS — R1032 Left lower quadrant pain: Secondary | ICD-10-CM | POA: Diagnosis not present

## 2021-06-24 MED ORDER — GADOBUTROL 1 MMOL/ML IV SOLN
10.0000 mL | Freq: Once | INTRAVENOUS | Status: AC | PRN
  Administered 2021-06-24: 10 mL via INTRAVENOUS

## 2021-06-30 ENCOUNTER — Ambulatory Visit (INDEPENDENT_AMBULATORY_CARE_PROVIDER_SITE_OTHER): Payer: BC Managed Care – PPO | Admitting: Orthopaedic Surgery

## 2021-06-30 ENCOUNTER — Other Ambulatory Visit: Payer: Self-pay

## 2021-06-30 ENCOUNTER — Encounter: Payer: Self-pay | Admitting: Orthopaedic Surgery

## 2021-06-30 ENCOUNTER — Ambulatory Visit (INDEPENDENT_AMBULATORY_CARE_PROVIDER_SITE_OTHER): Payer: BC Managed Care – PPO

## 2021-06-30 ENCOUNTER — Ambulatory Visit: Payer: Self-pay

## 2021-06-30 VITALS — Ht 69.0 in | Wt 220.0 lb

## 2021-06-30 DIAGNOSIS — M2240 Chondromalacia patellae, unspecified knee: Secondary | ICD-10-CM

## 2021-06-30 DIAGNOSIS — M25561 Pain in right knee: Secondary | ICD-10-CM

## 2021-06-30 DIAGNOSIS — M25562 Pain in left knee: Secondary | ICD-10-CM

## 2021-06-30 DIAGNOSIS — M2241 Chondromalacia patellae, right knee: Secondary | ICD-10-CM | POA: Diagnosis not present

## 2021-06-30 HISTORY — DX: Chondromalacia patellae, unspecified knee: M22.40

## 2021-06-30 MED ORDER — LIDOCAINE HCL 1 % IJ SOLN
2.0000 mL | INTRAMUSCULAR | Status: AC | PRN
  Administered 2021-06-30: 2 mL

## 2021-06-30 MED ORDER — BUPIVACAINE HCL 0.25 % IJ SOLN
2.0000 mL | INTRAMUSCULAR | Status: AC | PRN
  Administered 2021-06-30: 2 mL via INTRA_ARTICULAR

## 2021-06-30 NOTE — Progress Notes (Signed)
? ?Office Visit Note ?  ?Patient: Kim Pham           ?Date of Birth: November 11, 1980           ?MRN: [id number] ?Visit Date: 06/30/2021 ?             ?Requested by: Kim Pham ?[address] ?[address] ?[place],  [place] [address] ?PCP: Kim Pham ? ? ?Assessment & Plan: ?Visit Diagnoses:  ?1. Acute pain of both knees   ?2. Chondromalacia of right patella   ? ? ?Plan: Kim Pham status post right knee arthroscopy for chondromalacia of the patella and a medial shelf plica.  She has done well until recently when she was driving her son to college for hours on the road.  She has been experiencing bilateral knee pain mostly anterior.  She thinks she may have a little bit of swelling.  Clinically she has chondromalacia patella.  X-rays were nondiagnostic.  Discussed different treatment options including over-the-counter medicines and exercises.  She would like to try her cortisone injection in the more symptomatic right knee.  This was performed without difficulty and we will plan to see her back in 2 weeks consider injecting the left knee ? ?Follow-Up Instructions: Return in about 2 weeks (around 07/14/2021).  ? ?Orders:  ?Orders Placed This Encounter  ?Procedures  ? XR KNEE 3 VIEW LEFT  ? XR KNEE 3 VIEW RIGHT  ? ?No orders of the defined types were placed in this encounter. ? ? ? ? Procedures: ?Large Joint Inj: R knee on 06/30/2021 3:15 PM ?Indications: pain and diagnostic evaluation ?Details: 25 G 1.5 in needle, anteromedial approach ? ?Arthrogram: No ? ?Medications: 2 mL lidocaine 1 %; 2 mL bupivacaine 0.25 % ? ?12 mg betamethasone injected with Xylocaine and Marcaine along the medial aspect of right knee ?Procedure, treatment alternatives, risks and benefits explained, specific risks discussed. Consent was given by the patient. Immediately prior to procedure a time out was called to verify the correct patient, procedure, equipment, support staff and site/side marked as required. Patient  was prepped and draped in the usual sterile fashion.  ? ? ? ? ?Clinical Data: ?No additional findings. ? ? ?Subjective: ?Chief Complaint  ?Patient presents with  ? Right Knee - Pain  ? Left Knee - Pain  ?Patient presents today for bilateral knee pain. She said that she had a right knee arthroscopy in August of last year and was going great. She said that she took her son to college two weeks ago and has been hurting since due to the 2hr ride there. She said that she is having a difficult time getting up. She is taking Advil 2-3times daily, along with applying Voltaren gel . No known injury.  ? ?HPI ? ?Review of Systems ? ? ?Objective: ?Vital Signs: Ht 5' 9"  (1.753 m)   Wt 220 lb (99.8 kg)   BMI 32.49 kg/m?  ? ?Physical Exam ?Constitutional:   ?   Appearance: She is well-developed.  ?Eyes:  ?   Pupils: Pupils are equal, round, and reactive to light.  ?Pulmonary:  ?   Effort: Pulmonary effort is normal.  ?Skin: ?   General: Skin is warm and dry.  ?Neurological:  ?   Mental Status: She is alert and oriented to person, place, and time.  ?Psychiatric:     ?   Behavior: Behavior normal.  ? ? ?Ortho Exam positive crepitation with patella flexion extension both knees but more on  the right than the left.  No effusion.  Neither knee was hot warm or red.  No medial or lateral joint pain of either knee.  No instability of either knee.  Full extension of flex at least 100 degrees.  Some discomfort with patella compression more so on the right than the left.  Painless range of motion both hips.  Straight leg raise negative ? ?Specialty Comments:  ?No specialty comments available. ? ?Imaging: ?XR KNEE 3 VIEW LEFT ? ?Result Date: 06/30/2021 ?Films of the left knee were obtained in 3 projections standing.  Joint spaces are well-maintained.  No significant sclerosis on either side of the joint.  The patella tracks in the midline.  No ectopic calcification or acute changes.  By history has chondromalacia of the right patella but no  obvious changes by x-ray on the left ? ?XR KNEE 3 VIEW RIGHT ? ?Result Date: 06/30/2021 ?Films of the right knee were obtained in 3 projections standing.  Has had prior knee arthroscopy with chondromalacia patella.  No obvious changes on the films.  Joint spaces are well-maintained both medially and laterally without osteophyte formation.  Neutral alignment.  No ectopic calcification  ? ? ?PMFS History: ?Patient Active Problem List  ? Diagnosis Date Noted  ? Chondromalacia, patella 06/30/2021  ? Family history of BRCA gene mutation 06/17/2021  ? Cyst of left ovary 06/17/2021  ? Vasomotor symptoms due to menopause 06/17/2021  ? Rectal bleeding 06/11/2021  ? Adnexal mass 06/11/2021  ? Hot flashes 06/11/2021  ? Constipation 06/11/2021  ? Right lower quadrant abdominal pain 06/05/2021  ? Pain in right knee 11/13/2020  ? Insomnia 08/19/2020  ? Gastroesophageal reflux disease 08/19/2020  ? Psychogenic nonepileptic seizure 07/27/2017  ? Chronic migraine without aura, with intractable migraine, so stated, with status migrainosus 07/27/2017  ? Diarrhea 07/08/2017  ? Nonepileptic episode ([place]) 12/22/2016  ? Sicca syndrome ([place]) 11/22/2016  ? Raynaud's disease without gangrene 11/22/2016  ? Hair loss 11/22/2016  ? Photosensitivity 11/22/2016  ? History of Chiari malformation 11/22/2016  ? High risk medication use 11/22/2016  ? Pain in joint, multiple sites 08/18/2016  ? Fatigue 03/26/2016  ? SLE (systemic lupus erythematosus) ([place]) 03/26/2016  ? Depression with anxiety 03/26/2016  ? Numbness and tingling 02/25/2016  ? Transient alteration of awareness 01/01/2016  ? Chiari malformation 12/19/2015  ? ?Past Medical History:  ?Diagnosis Date  ? Anxiety   ? Arthritis   ? Chiari malformation   ? Chronic headaches   ? COVID   ? Endometriosis   ? GERD (gastroesophageal reflux disease)   ? tums otc, prevacid prn  ? Headache   ? Insomnia 08/19/2020  ? Low blood pressure   ? pt states history of low blood pressure-fainted  12/2010-instructed to move slowly  ? SLE (systemic lupus erythematosus) ([place]) 03/26/2016  ?  ?Family History  ?Problem Relation Age of Onset  ? Hypertension Mother   ? CAD Mother   ? Breast cancer Mother   ? Migraines Mother   ? Gout Mother   ? Heart disease Mother   ? Crohn's disease Mother   ? Hypertension Father   ? CAD Father   ? CVA Father   ? Heart disease Father   ? Sjogren's syndrome Sister   ? Celiac disease Sister   ? Rheum arthritis Maternal Grandmother   ? Autism Son   ? ADD / ADHD Son   ? Pancreatic cancer Maternal Grandfather   ? Breast cancer Maternal Aunt   ?  ?  Past Surgical History:  ?Procedure Laterality Date  ? ABDOMINAL HYSTERECTOMY  2007  ? CYSTOSCOPY  2007  ? DIAGNOSTIC LAPAROSCOPY  2005  ? ROTATOR CUFF REPAIR  2010  ? right shoulder  ? SALPINGOOPHORECTOMY  04/16/2011  ? Procedure: SALPINGO OOPHERECTOMY;  Surgeon: Lovenia Kim, MD;  Location: [facility];  Service: Gynecology;  Laterality: Right;  ? skull decompression surgery 2005    ? ?Social History  ? ?Occupational History  ? Occupation: nurse  ?Tobacco Use  ? Smoking status: Former  ?  Packs/day: 1.00  ?  Years: 10.00  ?  Pack years: 10.00  ?  Types: Cigarettes  ?  Quit date: 04/04/2004  ?  Years since quitting: 17.2  ? Smokeless tobacco: Never  ?Vaping Use  ? Vaping Use: Never used  ?Substance and Sexual Activity  ? Alcohol use: Yes  ?  Comment: occ.  ? Drug use: No  ? Sexual activity: Not on file  ? ? ? ? ? ? ?

## 2021-07-01 ENCOUNTER — Ambulatory Visit: Payer: BC Managed Care – PPO | Admitting: Orthopaedic Surgery

## 2021-07-02 ENCOUNTER — Encounter: Payer: Self-pay | Admitting: Gastroenterology

## 2021-07-03 ENCOUNTER — Ambulatory Visit: Payer: BC Managed Care – PPO | Admitting: Gastroenterology

## 2021-07-06 ENCOUNTER — Ambulatory Visit: Payer: BC Managed Care – PPO | Admitting: Internal Medicine

## 2021-07-06 ENCOUNTER — Ambulatory Visit: Payer: BC Managed Care – PPO | Admitting: Rheumatology

## 2021-07-13 ENCOUNTER — Encounter: Payer: Self-pay | Admitting: Family

## 2021-07-14 ENCOUNTER — Encounter: Payer: Self-pay | Admitting: Orthopaedic Surgery

## 2021-07-14 ENCOUNTER — Ambulatory Visit: Payer: BC Managed Care – PPO | Admitting: Orthopaedic Surgery

## 2021-07-14 ENCOUNTER — Encounter: Payer: Self-pay | Admitting: Obstetrics & Gynecology

## 2021-07-29 ENCOUNTER — Encounter: Payer: Self-pay | Admitting: Obstetrics & Gynecology

## 2021-07-29 ENCOUNTER — Ambulatory Visit (INDEPENDENT_AMBULATORY_CARE_PROVIDER_SITE_OTHER): Payer: BC Managed Care – PPO | Admitting: Obstetrics & Gynecology

## 2021-07-29 VITALS — BP 101/63 | HR 81 | Ht 69.0 in | Wt 226.0 lb

## 2021-07-29 DIAGNOSIS — N83202 Unspecified ovarian cyst, left side: Secondary | ICD-10-CM

## 2021-07-29 DIAGNOSIS — R102 Pelvic and perineal pain: Secondary | ICD-10-CM

## 2021-07-29 MED ORDER — HYDROCODONE-ACETAMINOPHEN 5-325 MG PO TABS: 1.0000 | ORAL_TABLET | Freq: Four times a day (QID) | ORAL | 0 refills | Status: DC | PRN

## 2021-07-29 NOTE — Progress Notes (Signed)
Surgery Consult ?Needs new RX Paxil, prescribed by Dr. Nehemiah Settle but only 30 tabs each RX, takes 2 a day per order, only lasts 15 days. ?

## 2021-07-29 NOTE — Progress Notes (Signed)
History:  ?41 y.o. here today for eval of persistent pelvic pain due to left OV cyst. Pt was initially seen by Dr. Nehemiah Settle for this pain on 06/17/2021. Pt reports that her left lower quadrant pain has been ongoing for several months now and has been progressively worseing. She reports that it keep her from sleeping and had caused her to miss days from work.  Her pain radiates into her back at times.  She has been taking ibuprofen 800 mg 3-4 times a day and using a heating pad at night with no significant relief of her sx.  She has had a Pelvic CT and [place] which confirms a left ov cyst.  ?  ?She does have a GYN surgical history: She had a total laparoscopic hysterectomy, which included her cervix when she was 51-12 years old.  At that time, she was diagnosed with severe endometriosis.  She then had a right oophorectomy with lysis of adhesions and removal of endometriosis in 2012 due to pain and cysts on her right ovary.  This was done robotically by Dr. Ronita Hipps (op note in [place]). ?  ?She does have a family history of BRCA positive breast cancer in her mom and a sister with OV cancer. Her testing from her last visit was neg.     ? ?The following portions of the patient's history were reviewed and updated as appropriate: allergies, current medications, past family history, past medical history, past social history, past surgical history and problem list. ? ?Review of Systems:  ?Pertinent items are noted in HPI. ?   ?Objective:  ?Physical Exam ?Blood pressure 101/63, pulse 81, height _0  (1.753 m), weight 226 lb (102.5 kg). ? ?CONSTITUTIONAL: Well-developed, well-nourished female in no acute distress.  ?HENT:  Normocephalic, atraumatic ?EYES: Conjunctivae and EOM are normal. No scleral icterus.  ?NECK: Normal range of motion ?SKIN: Skin is warm and dry. No rash noted. Not diaphoretic.No pallor. ?[place]: Alert and oriented to person, place, and time. Normal coordination.  ?Abd: Obese, soft, nontender and nondistended. No  rebound or involuntary guarding.   ?Pelvic: Normal appearing external genitalia; Bimanual only:  Normal discharge.  No palpable adnexal masses. There is bilateral adnexal tenderness.   ? ?Labs and Imaging ?06/16/21 ?CLINICAL DATA:  History of hysterectomy and right-sided oophorectomy ?with left adnexal cyst seen on CT. ?  ?EXAM: ?TRANSABDOMINAL ULTRASOUND OF PELVIS ?  ?TECHNIQUE: ?Transabdominal ultrasound examination of the pelvis was performed ?including evaluation of the uterus, ovaries, adnexal regions, and ?pelvic cul-de-sac. ?  ?COMPARISON:  Pelvic ultrasound, dated September 17, 2004, abdomen and ?pelvis CT dated September 29, 2016. ?  ?FINDINGS: ?Uterus ?  ?The uterus is surgically absent. ?  ?Endometrium ?  ?Thickness: N/A. ?  ?Right ovary ?  ?The right ovary is surgically absent. ?  ?Left ovary ?  ?Measurements: 4.1 cm x 3.7 cm x 3.1 cm = volume: 24.3 mL. A 3.0 cm x ?2.5 cm x 2.1 cm multi-septated anechoic structure is seen within the ?left ovary. This area is not visualized on the prior exams. No ?abnormal flow is seen within this region on color Doppler ?evaluation. ?  ?Other findings:  No abnormal free fluid. ?  ?IMPRESSION: ?1. Evidence of prior hysterectomy and prior right oophorectomy. ?2. Complex, multi-septated left ovarian cyst. Surgical consultation ?and follow-up with pelvic MRI is recommended to exclude the presence ?of a neoplastic process. ?  ?  ? ?Assessment & Plan:  ?Left ov cyst  ?Pelvic pain ? ?Patient desires surgical management with left  oophorectomy/possible left ovarian cystectomy.  The risks of surgery were discussed in detail with the patient including but not limited to: bleeding which may require transfusion or reoperation; infection which may require prolonged hospitalization or re-hospitalization and antibiotic therapy; injury to bowel, bladder, ureters and major vessels or other surrounding organs; need for additional procedures including laparotomy; thromboembolic phenomenon, incisional  problems and other postoperative or anesthesia complications.  Patient was told that the likelihood that her condition and symptoms will be treated effectively with this surgical management was very high; the postoperative expectations were also discussed in detail. The patient also understands the alternative treatment options which were discussed in full. All questions were answered.  She was told that she will be contacted by our surgical scheduler regarding the time and date of her surgery; routine preoperative instructions of having nothing to eat or drink after midnight on the day prior to surgery and also coming to the hospital 1 1/2 hours prior to her time of surgery were also emphasized.  She was told she may be called for a preoperative appointment about a week prior to surgery and will be given further preoperative instructions at that visit. Printed patient education handouts about the procedure were given to the patient to review at home.  ? ?Philippa was seen today for surgery consult. ? ?Diagnoses and all orders for this visit: ? ?Ovarian cyst, left ?-     Discontinue: HYDROcodone-acetaminophen (NORCO/VICODIN) 5-325 MG tablet; Take 1 tablet by mouth every 6 (six) hours as needed. ?-     HYDROcodone-acetaminophen (NORCO/VICODIN) 5-325 MG tablet; Take 1 tablet by mouth every 6 (six) hours as needed. ? ?  ?Total face-to-face time with patient, review of chart, and coordination of care was 30.   ? ?Neville Pauls L. Harraway-Smith, M.D., [place] ? ? ?

## 2021-07-29 NOTE — Patient Instructions (Signed)
Unilateral Salpingo-Oophorectomy ?Unilateral salpingo-oophorectomy is the surgical removal of one fallopian tube and one ovary. The ovaries are the female reproductive organs that produce eggs. The fallopian tubes allow eggs to move from the ovaries to the uterus. You may need this procedure if you: ?Have an infection in the fallopian tube and ovary. ?Have scar tissue in the fallopian tube and ovary. ?Have a cyst or tumor on the ovary. ?Have had your uterus removed. ?Have cancer of the fallopian tube or ovary. ?There are three techniques that can be used for this procedure: ?Open. One large incision will be made in your abdomen. ?Laparoscopic. A thin, lighted tube with a small camera (laparoscope) will be used to perform the procedure. The laparoscope will allow your surgeon to make several small incisions in the abdomen instead of one large incision. ?Robot-assisted. A computer will be used to control surgical instruments that are attached to robotic arms. A laparoscope may also be used with this technique. ?You can still become pregnant with this procedure and can still have menstrual periods. This procedure will not affect your sex drive. ?Tell a health care provider about: ?Any allergies you have. ?All medicines you are taking, including vitamins, herbs, eye drops, creams, and over-the-counter medicines. ?Any problems you or family members have had with anesthetic medicines. ?Any blood disorders you have. ?Any surgeries you have had. ?Any medical conditions you have. ?Whether you are pregnant or may be pregnant. ?What are the risks? ?Generally, this is a safe procedure. However, problems may occur, including: ?Infection. ?Bleeding. ?Allergic reactions to medicines. ?Damage to nearby structures or organs. ?Blood clots in the legs or lungs. ?What happens before the procedure? ?Staying hydrated ?Follow instructions from your health care provider about hydration, which may include: ?Up to 2 hours before the  procedure - you may continue to drink clear liquids, such as water, clear fruit juice, black coffee, and plain tea. ?Eating and drinking restrictions ?Follow instructions from your health care provider about eating and drinking, which may include: ?8 hours before the procedure - stop eating heavy meals or foods, such as meat, fried foods, or fatty foods. ?6 hours before the procedure - stop eating light meals or foods, such as toast or cereal. ?6 hours before the procedure - stop drinking milk or drinks that contain milk. ?2 hours before the procedure - stop drinking clear liquids. ?Medicines ?Ask your health care provider about: ?Changing or stopping your regular medicines. This is especially important if you are taking diabetes medicines or blood thinners. ?Taking medicines such as aspirin and ibuprofen. These medicines can thin your blood. Do not take these medicines unless your health care provider tells you to take them. ?Taking over-the-counter medicines, vitamins, herbs, and supplements. ?General instructions ?Do not use any products that contain nicotine or tobacco for at least 4 weeks before the procedure. These products include cigarettes, chewing tobacco, and vaping devices, such as e-cigarettes. If you need help quitting, ask your health care provider. ?You may have an exam or tests, such as a blood or urine test. ?Ask your health care provider: ?How your surgery site will be marked. ?What steps will be taken to help prevent infection. These steps may include: ?Removing hair at the surgery site. ?Washing skin with a germ-killing soap. ?Taking antibiotic medicine. ?Plan to have a responsible adult take you home from the hospital or clinic. ?If you will be going home right after the procedure, plan to have a responsible adult care for you for the time you are  told. This is important. ?What happens during the procedure? ?An IV will be inserted into one of your veins. ?You will be given one or both of the  following: ?A medicine to help you relax (sedative). ?A medicine to make you fall asleep (general anesthetic). ?A small, thin tube (catheter) will be inserted through your urethra and into your bladder. The catheter drains urine during the procedure. ?Depending on the type of surgery you are having, one incision or several small incisions will be made in your abdomen. ?Your fallopian tube and ovary will be cut away from the uterus and removed. ?Your blood vessels will be clamped and tied to prevent too much bleeding. ?The incision or incisions in your abdomen will be closed with stitches (sutures), skin glue, or staples. ?A bandage (dressing) may be placed over your incision or incisions. ?The procedure may vary among health care providers and hospitals. ?What happens after the procedure? ?Your blood pressure, heart rate, breathing rate, and blood oxygen level will be monitored until you leave the hospital or clinic. ?You may continue to receive fluids and medicines through an IV. ?You may continue to have a catheter draining your urine. ?You may have to wear compression stockings. These stockings help to prevent blood clots and reduce swelling in your legs. ?You will be given pain medicine as needed. ?If you were given a sedative during the procedure, it can affect you for several hours. Do not drive or operate machinery until your health care provider says that it is safe. ?Summary ?Unilateral salpingo-oophorectomy is the surgical removal of one fallopian tube and one ovary. ?There are three techniques that can be used for this procedure: open, laparoscopic, and robotic. Talk with your health care provider about how your procedure will be done. ?This procedure will not stop you from becoming pregnant, or cause problems with your menstrual periods or sex drive. ?Plan to have a responsible adult take you home from the hospital or clinic. ?This information is not intended to replace advice given to you by your health  care provider. Make sure you discuss any questions you have with your health care provider. ?Document Revised: 02/26/2020 Document Reviewed: 02/26/2020 ?Elsevier Patient Education ? [place]. ? ?

## 2021-08-12 ENCOUNTER — Other Ambulatory Visit: Payer: Self-pay | Admitting: Medical

## 2021-08-13 ENCOUNTER — Encounter: Payer: Self-pay | Admitting: Obstetrics & Gynecology

## 2021-08-13 ENCOUNTER — Encounter: Payer: Self-pay | Admitting: Family Medicine

## 2021-08-13 MED ORDER — PAROXETINE HCL 20 MG PO TABS: 20.0000 mg | ORAL_TABLET | Freq: Every day | ORAL | 3 refills | Status: DC

## 2021-08-14 ENCOUNTER — Encounter (HOSPITAL_BASED_OUTPATIENT_CLINIC_OR_DEPARTMENT_OTHER): Payer: Self-pay | Admitting: Obstetrics & Gynecology

## 2021-08-14 ENCOUNTER — Encounter: Payer: Self-pay | Admitting: Obstetrics & Gynecology

## 2021-08-14 ENCOUNTER — Other Ambulatory Visit: Payer: Self-pay

## 2021-08-14 DIAGNOSIS — Z973 Presence of spectacles and contact lenses: Secondary | ICD-10-CM | POA: Insufficient documentation

## 2021-08-14 DIAGNOSIS — M1990 Unspecified osteoarthritis, unspecified site: Secondary | ICD-10-CM | POA: Insufficient documentation

## 2021-08-14 DIAGNOSIS — N912 Amenorrhea, unspecified: Secondary | ICD-10-CM | POA: Insufficient documentation

## 2021-08-14 DIAGNOSIS — K5909 Other constipation: Secondary | ICD-10-CM | POA: Insufficient documentation

## 2021-08-14 DIAGNOSIS — I959 Hypotension, unspecified: Secondary | ICD-10-CM | POA: Insufficient documentation

## 2021-08-14 DIAGNOSIS — F419 Anxiety disorder, unspecified: Secondary | ICD-10-CM

## 2021-08-14 DIAGNOSIS — T8859XA Other complications of anesthesia, initial encounter: Secondary | ICD-10-CM | POA: Insufficient documentation

## 2021-08-14 DIAGNOSIS — G8929 Other chronic pain: Secondary | ICD-10-CM | POA: Insufficient documentation

## 2021-08-14 DIAGNOSIS — R519 Headache, unspecified: Secondary | ICD-10-CM

## 2021-08-14 HISTORY — DX: Other complications of anesthesia, initial encounter: T88.59XA

## 2021-08-14 HISTORY — DX: Headache, unspecified: R51.9

## 2021-08-14 HISTORY — DX: Anxiety disorder, unspecified: F41.9

## 2021-08-14 HISTORY — DX: Other constipation: K59.09

## 2021-08-14 HISTORY — DX: Unspecified osteoarthritis, unspecified site: M19.90

## 2021-08-14 HISTORY — DX: Presence of spectacles and contact lenses: Z97.3

## 2021-08-14 HISTORY — DX: Amenorrhea, unspecified: N91.2

## 2021-08-14 HISTORY — DX: Other chronic pain: G89.29

## 2021-08-14 NOTE — Progress Notes (Signed)
Spoke w/ via phone for pre-op interview---pt ?Lab needs dos----  none per anesthesia, surgery orders requested dr Ihor Dow epic ib             ?Lab results------none ?COVID test -----patient states asymptomatic no test needed ?Arrive at -------530 am 08-18-2021 ?NPO after MN NO Solid Food.  Clear liquids from MN until---430 am ?Med rec completed ?Medications to take morning of surgery -----flonase nasal spray prn, omeprazole, hydrocodone prn ?Diabetic medication -----n/a ?You may shower with your normal soap morning of your surgery, please wash wash your belly button thoroughly morning of your surgery ?Patient instructed no nail polish to be worn day of surgery ?Patient instructed to bring photo id and insurance card day of surgery ?Patient aware to have Driver (ride ) / caregiver    fernando spouse will stay  for 24 hours after surgery  ?Patient Special Instructions -----pt given overnight stay instructions ?Pre-Op special Istructions -----none ?Patient verbalized understanding of instructions that were given at this phone interview. ?Patient denies shortness of breath, chest pain, fever, cough at this phone interview.  ?

## 2021-08-18 ENCOUNTER — Ambulatory Visit (HOSPITAL_BASED_OUTPATIENT_CLINIC_OR_DEPARTMENT_OTHER): Payer: BC Managed Care – PPO | Admitting: Anesthesiology

## 2021-08-18 ENCOUNTER — Other Ambulatory Visit: Payer: Self-pay

## 2021-08-18 ENCOUNTER — Other Ambulatory Visit: Payer: Self-pay | Admitting: Family Medicine

## 2021-08-18 ENCOUNTER — Encounter (HOSPITAL_BASED_OUTPATIENT_CLINIC_OR_DEPARTMENT_OTHER): Payer: Self-pay | Admitting: Obstetrics & Gynecology

## 2021-08-18 ENCOUNTER — Encounter (HOSPITAL_BASED_OUTPATIENT_CLINIC_OR_DEPARTMENT_OTHER)
Admission: RE | Disposition: A | Discharge status: Home / Self Care | Payer: Self-pay | Source: Home / Self Care | Attending: Obstetrics & Gynecology

## 2021-08-18 ENCOUNTER — Ambulatory Visit (HOSPITAL_BASED_OUTPATIENT_CLINIC_OR_DEPARTMENT_OTHER)
Admission: RE | Admit: 2021-08-18 | Discharge: 2021-08-18 | Disposition: A | Discharge status: Home / Self Care | Payer: BC Managed Care – PPO | Attending: Obstetrics & Gynecology | Admitting: Obstetrics & Gynecology

## 2021-08-18 ENCOUNTER — Other Ambulatory Visit: Payer: Self-pay | Admitting: Obstetrics and Gynecology

## 2021-08-18 DIAGNOSIS — N8312 Corpus luteum cyst of left ovary: Secondary | ICD-10-CM | POA: Diagnosis not present

## 2021-08-18 DIAGNOSIS — F419 Anxiety disorder, unspecified: Secondary | ICD-10-CM | POA: Insufficient documentation

## 2021-08-18 DIAGNOSIS — N83292 Other ovarian cyst, left side: Secondary | ICD-10-CM

## 2021-08-18 DIAGNOSIS — N83202 Unspecified ovarian cyst, left side: Secondary | ICD-10-CM | POA: Diagnosis present

## 2021-08-18 DIAGNOSIS — R102 Pelvic and perineal pain: Secondary | ICD-10-CM

## 2021-08-18 DIAGNOSIS — N9489 Other specified conditions associated with female genital organs and menstrual cycle: Secondary | ICD-10-CM | POA: Diagnosis present

## 2021-08-18 DIAGNOSIS — N83209 Unspecified ovarian cyst, unspecified side: Secondary | ICD-10-CM | POA: Diagnosis not present

## 2021-08-18 DIAGNOSIS — G8918 Other acute postprocedural pain: Secondary | ICD-10-CM

## 2021-08-18 DIAGNOSIS — K219 Gastro-esophageal reflux disease without esophagitis: Secondary | ICD-10-CM | POA: Insufficient documentation

## 2021-08-18 DIAGNOSIS — Z87891 Personal history of nicotine dependence: Secondary | ICD-10-CM | POA: Insufficient documentation

## 2021-08-18 HISTORY — PX: OOPHORECTOMY: SHX86

## 2021-08-18 HISTORY — DX: Pelvic and perineal pain: R10.2

## 2021-08-18 SURGERY — OOPHORECTOMY, LAPAROSCOPIC
Anesthesia: General | Site: Abdomen | Laterality: Left

## 2021-08-18 MED ORDER — DEXAMETHASONE SODIUM PHOSPHATE 10 MG/ML IJ SOLN
INTRAMUSCULAR | Status: DC | PRN
  Administered 2021-08-18 (×2): 5 mg via INTRAVENOUS

## 2021-08-18 MED ORDER — PROPOFOL 10 MG/ML IV BOLUS
INTRAVENOUS | Status: DC | PRN
  Administered 2021-08-18: 150 mg via INTRAVENOUS

## 2021-08-18 MED ORDER — ROCURONIUM BROMIDE 10 MG/ML (PF) SYRINGE
PREFILLED_SYRINGE | INTRAVENOUS | Status: DC | PRN
  Administered 2021-08-18: 70 mg via INTRAVENOUS

## 2021-08-18 MED ORDER — HYDROMORPHONE HCL 1 MG/ML IJ SOLN
INTRAMUSCULAR | Status: AC
  Filled 2021-08-18: qty 1

## 2021-08-18 MED ORDER — SODIUM CHLORIDE 0.9 % IR SOLN
Status: DC | PRN
  Administered 2021-08-18: 1000 mL

## 2021-08-18 MED ORDER — MIDAZOLAM HCL 2 MG/2ML IJ SOLN
INTRAMUSCULAR | Status: DC | PRN
  Administered 2021-08-18: 2 mg via INTRAVENOUS

## 2021-08-18 MED ORDER — OXYCODONE HCL 5 MG PO TABS
5.0000 mg | ORAL_TABLET | Freq: Once | ORAL | Status: AC | PRN
  Administered 2021-08-18: 5 mg via ORAL

## 2021-08-18 MED ORDER — KETOROLAC TROMETHAMINE 30 MG/ML IJ SOLN
INTRAMUSCULAR | Status: DC | PRN
  Administered 2021-08-18: 30 mg via INTRAVENOUS

## 2021-08-18 MED ORDER — ONDANSETRON HCL 4 MG/2ML IJ SOLN
INTRAMUSCULAR | Status: DC | PRN
  Administered 2021-08-18: 4 mg via INTRAVENOUS

## 2021-08-18 MED ORDER — HYDROMORPHONE HCL 1 MG/ML IJ SOLN
0.2500 mg | INTRAMUSCULAR | Status: DC | PRN
  Administered 2021-08-18 (×4): 0.5 mg via INTRAVENOUS

## 2021-08-18 MED ORDER — KETOROLAC TROMETHAMINE 30 MG/ML IJ SOLN: 30.0000 mg | Freq: Once | INTRAMUSCULAR | Status: DC | PRN

## 2021-08-18 MED ORDER — LACTATED RINGERS IV SOLN: INTRAVENOUS | Status: DC

## 2021-08-18 MED ORDER — PROPOFOL 10 MG/ML IV BOLUS
INTRAVENOUS | Status: AC
Start: 2021-08-18 — End: ?
  Filled 2021-08-18: qty 20

## 2021-08-18 MED ORDER — DROPERIDOL 2.5 MG/ML IJ SOLN
0.0625 mg | Freq: Once | INTRAMUSCULAR | Status: AC
  Administered 2021-08-18: .0625 mg via INTRAVENOUS

## 2021-08-18 MED ORDER — SUGAMMADEX SODIUM 200 MG/2ML IV SOLN
INTRAVENOUS | Status: DC | PRN
Start: 2021-08-18 — End: 2021-08-18
  Administered 2021-08-18: 200 mg via INTRAVENOUS

## 2021-08-18 MED ORDER — OXYCODONE HCL 5 MG/5ML PO SOLN: 5.0000 mg | Freq: Once | ORAL | Status: AC | PRN

## 2021-08-18 MED ORDER — HEMOSTATIC AGENTS (NO CHARGE) OPTIME
TOPICAL | Status: DC | PRN
  Administered 2021-08-18: 1 via TOPICAL

## 2021-08-18 MED ORDER — MIDAZOLAM HCL 2 MG/2ML IJ SOLN
INTRAMUSCULAR | Status: AC
  Filled 2021-08-18: qty 2

## 2021-08-18 MED ORDER — OXYCODONE HCL 5 MG PO TABS
ORAL_TABLET | ORAL | Status: AC
  Filled 2021-08-18: qty 1

## 2021-08-18 MED ORDER — SOD CITRATE-CITRIC ACID 500-334 MG/5ML PO SOLN: 30.0000 mL | ORAL | Status: DC

## 2021-08-18 MED ORDER — KETOROLAC TROMETHAMINE 30 MG/ML IJ SOLN
INTRAMUSCULAR | Status: AC
  Filled 2021-08-18: qty 1

## 2021-08-18 MED ORDER — ACETAMINOPHEN 500 MG PO TABS
ORAL_TABLET | ORAL | Status: AC
  Filled 2021-08-18: qty 2

## 2021-08-18 MED ORDER — ROCURONIUM BROMIDE 10 MG/ML (PF) SYRINGE
PREFILLED_SYRINGE | INTRAVENOUS | Status: AC
  Filled 2021-08-18: qty 10

## 2021-08-18 MED ORDER — LIDOCAINE HCL (PF) 2 % IJ SOLN
INTRAMUSCULAR | Status: AC
  Filled 2021-08-18: qty 5

## 2021-08-18 MED ORDER — SCOPOLAMINE 1 MG/3DAYS TD PT72
1.0000 | MEDICATED_PATCH | TRANSDERMAL | Status: DC
  Administered 2021-08-18: 1.5 mg via TRANSDERMAL

## 2021-08-18 MED ORDER — LIDOCAINE 2% (20 MG/ML) 5 ML SYRINGE
INTRAMUSCULAR | Status: DC | PRN
Start: 2021-08-18 — End: 2021-08-18
  Administered 2021-08-18: 60 mg via INTRAVENOUS

## 2021-08-18 MED ORDER — ONDANSETRON HCL 4 MG/2ML IJ SOLN
INTRAMUSCULAR | Status: AC
  Filled 2021-08-18: qty 2

## 2021-08-18 MED ORDER — DROPERIDOL 2.5 MG/ML IJ SOLN
INTRAMUSCULAR | Status: AC
  Filled 2021-08-18: qty 2

## 2021-08-18 MED ORDER — SCOPOLAMINE 1 MG/3DAYS TD PT72
MEDICATED_PATCH | TRANSDERMAL | Status: AC
  Filled 2021-08-18: qty 1

## 2021-08-18 MED ORDER — WHITE PETROLATUM EX OINT
TOPICAL_OINTMENT | CUTANEOUS | Status: AC
  Filled 2021-08-18: qty 5

## 2021-08-18 MED ORDER — BUPIVACAINE HCL (PF) 0.5 % IJ SOLN
INTRAMUSCULAR | Status: DC | PRN
Start: 2021-08-18 — End: 2021-08-18
  Administered 2021-08-18: 30 mL

## 2021-08-18 MED ORDER — DEXAMETHASONE SODIUM PHOSPHATE 10 MG/ML IJ SOLN
INTRAMUSCULAR | Status: AC
  Filled 2021-08-18: qty 1

## 2021-08-18 MED ORDER — FENTANYL CITRATE (PF) 100 MCG/2ML IJ SOLN
INTRAMUSCULAR | Status: AC
  Filled 2021-08-18: qty 2

## 2021-08-18 MED ORDER — HYDROCODONE-ACETAMINOPHEN 5-325 MG PO TABS: 1.0000 | ORAL_TABLET | Freq: Four times a day (QID) | ORAL | 0 refills | Status: DC | PRN

## 2021-08-18 MED ORDER — ACETAMINOPHEN 500 MG PO TABS
1000.0000 mg | ORAL_TABLET | ORAL | Status: AC
  Administered 2021-08-18: 1000 mg via ORAL

## 2021-08-18 MED ORDER — FENTANYL CITRATE (PF) 100 MCG/2ML IJ SOLN
INTRAMUSCULAR | Status: DC | PRN
  Administered 2021-08-18 (×2): 50 ug via INTRAVENOUS

## 2021-08-18 MED ORDER — 0.9 % SODIUM CHLORIDE (POUR BTL) OPTIME
TOPICAL | Status: DC | PRN
  Administered 2021-08-18: 500 mL

## 2021-08-18 MED ORDER — ONDANSETRON HCL 4 MG/2ML IJ SOLN: 4.0000 mg | Freq: Once | INTRAMUSCULAR | Status: DC | PRN

## 2021-08-18 SURGICAL SUPPLY — 49 items
ADH SKN CLS APL DERMABOND .7 (GAUZE/BANDAGES/DRESSINGS) ×2
APL SRG 38 LTWT LNG FL B (MISCELLANEOUS) ×2
APPLICATOR ARISTA FLEXITIP XL (MISCELLANEOUS) ×2 IMPLANT
BAG RETRIEVAL 10 (BASKET) ×1
COVER MAYO STAND STRL (DRAPES) ×4 IMPLANT
DERMABOND ADVANCED (GAUZE/BANDAGES/DRESSINGS) ×1
DERMABOND ADVANCED .7 DNX12 (GAUZE/BANDAGES/DRESSINGS) ×3 IMPLANT
DRSG TELFA 3X8 NADH (GAUZE/BANDAGES/DRESSINGS) ×3 IMPLANT
DURAPREP 26ML APPLICATOR (WOUND CARE) ×4 IMPLANT
GAUZE 4X4 16PLY ~~LOC~~+RFID DBL (SPONGE) ×8 IMPLANT
GLOVE BIO SURGEON STRL SZ 6.5 (GLOVE) ×2 IMPLANT
GLOVE BIO SURGEON STRL SZ7 (GLOVE) ×4 IMPLANT
GLOVE BIO SURGEON STRL SZ8.5 (GLOVE) ×2 IMPLANT
GLOVE BIOGEL PI IND STRL 7.0 (GLOVE) ×14 IMPLANT
GLOVE BIOGEL PI IND STRL 8.5 (GLOVE) ×1 IMPLANT
GLOVE BIOGEL PI INDICATOR 7.0 (GLOVE) ×6
GLOVE BIOGEL PI INDICATOR 8.5 (GLOVE) ×1
GOWN STRL REUS W/TWL LRG LVL3 (GOWN DISPOSABLE) ×12 IMPLANT
GOWN STRL REUS W/TWL XL LVL3 (GOWN DISPOSABLE) ×2 IMPLANT
HEMOSTAT ARISTA ABSORB 3G PWDR (HEMOSTASIS) ×2 IMPLANT
IV NS 1000ML (IV SOLUTION) ×3
IV NS 1000ML BAXH (IV SOLUTION) ×1 IMPLANT
KIT TURNOVER CYSTO (KITS) ×4 IMPLANT
MANIPULATOR UTERINE 4.5 ZUMI (MISCELLANEOUS) ×4 IMPLANT
NEEDLE INSUFFLATION 120MM (ENDOMECHANICALS) ×4 IMPLANT
NS IRRIG 500ML POUR BTL (IV SOLUTION) ×2 IMPLANT
PACK LAPAROSCOPY BASIN (CUSTOM PROCEDURE TRAY) ×4 IMPLANT
PACK TRENDGUARD 450 HYBRID PRO (MISCELLANEOUS) ×1 IMPLANT
PAD DRESSING TELFA 3X8 NADH (GAUZE/BANDAGES/DRESSINGS) IMPLANT
PROTECTOR NERVE ULNAR (MISCELLANEOUS) ×8 IMPLANT
SET SUCTION IRRIG HYDROSURG (IRRIGATION / IRRIGATOR) ×2 IMPLANT
SET TUBE SMOKE EVAC HIGH FLOW (TUBING) ×2 IMPLANT
SHEARS HARMONIC ACE PLUS 36CM (ENDOMECHANICALS) ×2 IMPLANT
SUT VIC AB 1 CT1 27 (SUTURE) ×3
SUT VIC AB 1 CT1 27XBRD ANTBC (SUTURE) ×1 IMPLANT
SUT VIC AB 3-0 X1 27 (SUTURE) ×4 IMPLANT
SUT VIC AB 4-0 PS2 18 (SUTURE) ×4 IMPLANT
SUT VICRYL 0 UR6 27IN ABS (SUTURE) ×2 IMPLANT
SYR 10ML LL (SYRINGE) ×4 IMPLANT
SYS BAG RETRIEVAL 10MM (BASKET) ×2
SYSTEM BAG RETRIEVAL 10MM (BASKET) ×1 IMPLANT
SYSTEM CARTER THOMASON II (TROCAR) ×2 IMPLANT
TOWEL OR 17X26 10 PK STRL BLUE (TOWEL DISPOSABLE) ×4 IMPLANT
TRAY FOLEY W/BAG SLVR 14FR LF (SET/KITS/TRAYS/PACK) ×2 IMPLANT
TRENDGUARD 450 HYBRID PRO PACK (MISCELLANEOUS) ×3
TROCAR BLADELESS OPT 5 100 (ENDOMECHANICALS) ×2 IMPLANT
TROCAR OPTI TIP 5M 100M (ENDOMECHANICALS) ×4 IMPLANT
TROCAR XCEL DIL TIP R 11M (ENDOMECHANICALS) ×2 IMPLANT
WARMER LAPAROSCOPE (MISCELLANEOUS) ×4 IMPLANT

## 2021-08-18 NOTE — Brief Op Note (Signed)
08/18/2021 ? ?9:00 AM ? ?PATIENT:  Kim Pham  41 y.o. female ? ?PRE-OPERATIVE DIAGNOSIS:  Ovarian Cyst ?Pelvic Pain ? ?POST-OPERATIVE DIAGNOSIS:  Ovarian Cyst ?Pelvic Pain ? ?PROCEDURE:  Procedure(s): ?LAPAROSCOPIC OOPHORECTOMY (Left) ? ?SURGEON:  Surgeon(s) and Role: ?   Lavonia Drafts, MD - Primary ?   Megan Salon, MD - Assisting ? ?ANESTHESIA:   local and general ? ?EBL:  100 mL  ? ?BLOOD ADMINISTERED:none ? ?DRAINS: none  ? ?LOCAL MEDICATIONS USED:  MARCAINE    ? ?SPECIMEN:  Source of Specimen:  left ovary ? ?DISPOSITION OF SPECIMEN:  PATHOLOGY ? ?COUNTS: yes ? ?TOURNIQUET:  * No tourniquets in log * ? ?DICTATION: .Note written in EPIC ? ?PLAN OF CARE: Discharge to home after PACU ? ?PATIENT DISPOSITION:  PACU - hemodynamically stable. ?  ?Delay start of Pharmacological VTE agent (>24hrs) due to surgical blood loss or risk of bleeding: not applicable ? ?Jontue Crumpacker L. Harraway-Smith, M.D., [place] ? ?

## 2021-08-18 NOTE — Discharge Instructions (Addendum)
?  Post Anesthesia Home Care Instructions ? ?Activity: ?Get plenty of rest for the remainder of the day. A responsible individual must stay with you for 24 hours following the procedure.  ?For the next 24 hours, DO NOT: ?-Drive a car ?-Paediatric nurse ?-Drink alcoholic beverages ?-Take any medication unless instructed by your physician ?-Make any legal decisions or sign important papers. ? ?Meals: ?Start with liquid foods such as gelatin or soup. Progress to regular foods as tolerated. Avoid greasy, spicy, heavy foods. If nausea and/or vomiting occur, drink only clear liquids until the nausea and/or vomiting subsides. Call your physician if vomiting continues. ? ?Special Instructions/Symptoms: ?Your throat may feel dry or sore from the anesthesia or the breathing tube placed in your throat during surgery. If this causes discomfort, gargle with warm salt water. The discomfort should disappear within 24 hours. ? ?If you had a scopolamine patch placed behind your ear for the management of post- operative nausea and/or vomiting: ? ?1. The medication in the patch is effective for 72 hours, after which it should be removed.  Wrap patch in a tissue and discard in the trash. Wash hands thoroughly with soap and water. ?2. You may remove the patch earlier than 72 hours if you experience unpleasant side effects which may include dry mouth, dizziness or visual disturbances. ?3. Avoid touching the patch. Wash your hands with soap and water after contact with the patch. ?    ? ?No Tylenol/acetaminophen until 12:15pm or after. ?No ibuprofen/Motrin/Advil until 2:40pm or after. ?

## 2021-08-18 NOTE — Progress Notes (Signed)
Attempted to send percocet in for Dr. Elgie Congo (on call ob/gyn) but unable to sign to send electronically.  ?

## 2021-08-18 NOTE — Anesthesia Preprocedure Evaluation (Signed)
Anesthesia Evaluation  ?Patient identified by MRN, date of birth, ID band ?Patient awake ? ? ? ?Reviewed: ?Allergy & Precautions, NPO status , Patient's Chart, lab work & pertinent test results ? ?Airway ?Mallampati: II ? ?TM Distance: >3 FB ?Neck ROM: Full ? ? ? Dental ?no notable dental hx. ? ?  ?Pulmonary ?neg pulmonary ROS, former smoker,  ?  ?Pulmonary exam normal ?breath sounds clear to auscultation ? ? ? ? ? ? Cardiovascular ?negative cardio ROS ?Normal cardiovascular exam ?Rhythm:Regular Rate:Normal ? ? ?  ?Neuro/Psych ?Anxiety negative neurological ROS ?   ? GI/Hepatic ?Neg liver ROS, GERD  Medicated,  ?Endo/Other  ?negative endocrine ROS ? Renal/GU ?negative Renal ROS  ?negative genitourinary ?  ?Musculoskeletal ?negative musculoskeletal ROS ?(+)  ? Abdominal ?  ?Peds ?negative pediatric ROS ?(+)  Hematology ?negative hematology ROS ?(+)   ?Anesthesia Other Findings ? ? Reproductive/Obstetrics ?negative OB ROS ? ?  ? ? ? ? ? ? ? ? ? ? ? ? ? ?  ?  ? ? ? ? ? ? ? ? ?Anesthesia Physical ?Anesthesia Plan ? ?ASA: 2 ? ?Anesthesia Plan: General  ? ?Post-op Pain Management: Minimal or no pain anticipated  ? ?Induction: Intravenous ? ?PONV Risk Score and Plan: 3 and Ondansetron, Dexamethasone, Midazolam and Treatment may vary due to age or medical condition ? ?Airway Management Planned: Oral ETT ? ?Additional Equipment:  ? ?Intra-op Plan:  ? ?Post-operative Plan: Extubation in OR ? ?Informed Consent: I have reviewed the patients History and Physical, chart, labs and discussed the procedure including the risks, benefits and alternatives for the proposed anesthesia with the patient or authorized representative who has indicated his/her understanding and acceptance.  ? ? ? ?Dental advisory given ? ?Plan Discussed with: CRNA and Surgeon ? ?Anesthesia Plan Comments:   ? ? ? ? ? ? ?Anesthesia Quick Evaluation ? ?

## 2021-08-18 NOTE — Anesthesia Postprocedure Evaluation (Signed)
Anesthesia Post Note ? ?Patient: Kim Pham ? ?Procedure(s) Performed: LAPAROSCOPIC OOPHORECTOMY (Left: Abdomen) ? ?  ? ?Patient location during evaluation: PACU ?Anesthesia Type: General ?Level of consciousness: awake and alert ?Pain management: pain level controlled ?Vital Signs Assessment: post-procedure vital signs reviewed and stable ?Respiratory status: spontaneous breathing, nonlabored ventilation, respiratory function stable and patient connected to nasal cannula oxygen ?Cardiovascular status: blood pressure returned to baseline and stable ?Postop Assessment: no apparent nausea or vomiting ?Anesthetic complications: no ? ? ?No notable events documented. ? ?Last Vitals:  ?Vitals:  ? 08/18/21 0900 08/18/21 0915  ?BP: 126/67 (!) 111/57  ?Pulse: 78 73  ?Resp: 17 16  ?Temp:    ?SpO2: 95% 94%  ?  ?Last Pain:  ?Vitals:  ? 08/18/21 0915  ?TempSrc:   ?PainSc: 6   ? ? ?  ?  ?  ?  ?  ?  ? ?Keltin Baird S ? ? ? ? ?

## 2021-08-18 NOTE — Anesthesia Procedure Notes (Signed)
Procedure Name: Intubation ?Date/Time: 08/18/2021 7:37 AM ?Performed by: Suan Halter, CRNA ?Pre-anesthesia Checklist: Patient identified, Emergency Drugs available, Suction available and Patient being monitored ?Patient Re-evaluated:Patient Re-evaluated prior to induction ?Oxygen Delivery Method: Circle system utilized ?Preoxygenation: Pre-oxygenation with 100% oxygen ?Induction Type: IV induction ?Ventilation: Mask ventilation without difficulty ?Laryngoscope Size: Mac and 3 ?Grade View: Grade I ?Tube type: Oral ?Tube size: 7.0 mm ?Number of attempts: 1 ?Airway Equipment and Method: Stylet and Oral airway ?Placement Confirmation: ETT inserted through vocal cords under direct vision, positive ETCO2 and breath sounds checked- equal and bilateral ?Secured at: 22 cm ?Tube secured with: Tape ?Dental Injury: Teeth and Oropharynx as per pre-operative assessment  ? ? ? ? ?

## 2021-08-18 NOTE — Op Note (Signed)
08/18/2021 ? ?9:00 AM ? ?PATIENT:  Kim Pham  41 y.o. female ? ?PRE-OPERATIVE DIAGNOSIS:  Ovarian Cyst ?Pelvic Pain ? ?POST-OPERATIVE DIAGNOSIS:  Ovarian Cyst ?Pelvic Pain ? ?PROCEDURE:  Procedure(s): ?LAPAROSCOPIC OOPHORECTOMY (Left) ? ?SURGEON:  Surgeon(s) and Role: ?   Lavonia Drafts, MD - Primary ?   Megan Salon, MD - Assisting ? ?ANESTHESIA:   local and general ? ?EBL:  100 mL  ? ?BLOOD ADMINISTERED:none ? ?DRAINS: none  ? ?LOCAL MEDICATIONS USED:  MARCAINE    ? ?SPECIMEN:  Source of Specimen:  left ovary ? ?DISPOSITION OF SPECIMEN:  PATHOLOGY ? ?COUNTS: yes ? ?TOURNIQUET:  * No tourniquets in log * ? ?DICTATION: .Note written in EPIC ? ?PLAN OF CARE: Discharge to home after PACU ? ?PATIENT DISPOSITION:  PACU - hemodynamically stable. ?  ?Delay start of Pharmacological VTE agent (>24hrs) due to surgical blood loss or risk of bleeding: not applicable ? ?INDICATIONS: 41 y.o. Pt with aforementioned preoperative diagnoses here today for definitive surgical management.   Risks of surgery were discussed with the patient including but not limited to: bleeding which may require transfusion or reoperation; infection which may require antibiotics; injury to bowel, bladder, ureters or other surrounding organs; need for additional procedures including laparotomy; thromboembolic phenomenon, incisional problems and other postoperative/anesthesia complications. Written informed consent was obtained.   ? ?FINDINGS:  Uterus and bilateral fallopian tube and right ovary prev resected. left ovary with simple cyst noted. The bowel on the left side is adherent to the pelvic sidewall.  No evidence of endometriosis, adhesions or any other abdominal/pelvic abnormality.  Normal upper abdomen. ? ?PROCEDURE IN DETAIL:  The patient had sequential compression devices applied to her lower extremities while in the preoperative area.  She was then taken to the operating room where general anesthesia was administered and  was found to be adequate.  She was placed in the dorsal lithotomy position, and was prepped and draped in a sterile manner.  A Foley catheter was inserted into her bladder and attached to constant drainage and a sponge stick was placed in the vagina. After an adequate timeout was performed, attention was then turned to the patient's abdomen where a 74m skin incision was made in the left upper quadrant.  A trocar with an Optiview was used to entire the pelvis. Intraperitoneal placement was confirmed by drop in intraabdominal pressure with insufflation of carbon dioxide gas.  Adequate pneumoperitoneum was obtained, and 2 5 mm ports were placed in the lower quadrants bilaterally. Placement was confirmed by the laparoscope. A survey of the patient's pelvis and abdomen revealed the findings above. On the left side .  The left infundibulopelvic ligament was also clamped and transected Harmonic device  Excellent hemostasis was noted. The specimen was then removed from the abdomen through the 11-mm port using an Endocatch bag, under direct visualization.  The operative site was surveyed, the left port had some bleeding and 2 0 vicryl sutures were placed using the CLeggett & Plattdevice.  Excellent hemostasis upon a survey of the pts pelvis. .  No intraoperative injury to other surrounding organs was noted.  The abdomen was desufflated and all instruments were then removed from the patient's abdomen. The fascial incision of the right lower quadrant port  was closed with a 0 Vicryl.  All skin incisions were closed with 3-0 Vicryl subcuticular stitches/Dermabond.  ? ?The patient will be discharged to home as per PACU criteria.  Routine postoperative instructions given.  She was prescribed  Percocet, Ibuprofen and Colace.  She will follow up in the clinic in 2 weeks for postoperative evaluation. ? ?An experienced assistant was required given the standard of surgical care given the complexity of the case.  This assistant was  needed for exposure, dissection, suctioning, retraction, instrument exchange, and for overall help during the procedure. ? ?Azure Barrales L. Harraway-Smith, M.D., [place] ? ? ?  ?

## 2021-08-18 NOTE — Transfer of Care (Signed)
Immediate Anesthesia Transfer of Care Note ? ?Patient: Amen Dargis Seddon ? ?Procedure(s) Performed: Procedure(s) (LRB): ?LAPAROSCOPIC OOPHORECTOMY (Left) ? ?Patient Location: PACU ? ?Anesthesia Type: General ? ?Level of Consciousness: awake, oriented, sedated and patient cooperative ? ?Airway & Oxygen Therapy: Patient Spontanous Breathing and Patient connected to face mask oxygen ? ?Post-op Assessment: Report given to PACU RN and Post -op Vital signs reviewed and stable ? ?Post vital signs: Reviewed and stable ? ?Complications: No apparent anesthesia complications ? ?Last Vitals:  ?Vitals Value Taken Time  ?BP 136/71 08/18/21 0853  ?Temp    ?Pulse 87 08/18/21 0856  ?Resp 18 08/18/21 0856  ?SpO2 94 % 08/18/21 0856  ?Vitals shown include unvalidated device data. ? ?Last Pain:  ?Vitals:  ? 08/18/21 0551  ?TempSrc: Oral  ?   ? ?Patients Stated Pain Goal: 4 (08/18/21 0612) ? ?Complications: No notable events documented. ?

## 2021-08-18 NOTE — H&P (Signed)
See H&P from 07/29/2021. No new changes. Prev surgeries: lap hyst; lap right oophorectomy. Prev dx of endometriosis.  ? ?Procedure and risks revived again. All questions answered.  ? ?Kim Pham, M.D., [place] ?  ?

## 2021-08-19 ENCOUNTER — Other Ambulatory Visit: Payer: Self-pay | Admitting: Obstetrics & Gynecology

## 2021-08-19 ENCOUNTER — Encounter: Payer: Self-pay | Admitting: Obstetrics & Gynecology

## 2021-08-19 ENCOUNTER — Telehealth: Payer: Self-pay | Admitting: Obstetrics & Gynecology

## 2021-08-19 DIAGNOSIS — Z9889 Other specified postprocedural states: Secondary | ICD-10-CM

## 2021-08-19 LAB — SURGICAL PATHOLOGY

## 2021-08-19 MED ORDER — TRAMADOL HCL 50 MG PO TABS: 50.0000 mg | ORAL_TABLET | Freq: Four times a day (QID) | ORAL | 0 refills | Status: DC | PRN

## 2021-08-19 NOTE — Telephone Encounter (Signed)
TC to pt post op. She reports pain on the left side and in the pubic region. The pain feels that it is internal. The pain on the right side keep her from lying on it.  The right port is bruising slightly.  Pt reports that overnight she took the pain meds 2 at a time at the advice of the on call doc. She reports that she is voiding without difficulty. She denies SOB or DOE. She has been passing gas.  ? ?Rec heating pad to the abd.  ?Will call in Tramadol.   ?Pt to call if sx worsen or progress.  ?All questions answered.  ? ?Osmel Dykstra L. Harraway-Smith, M.D., [place] ?   ? ?       ?

## 2021-08-20 ENCOUNTER — Encounter: Payer: Self-pay | Admitting: General Practice

## 2021-08-24 ENCOUNTER — Encounter: Payer: Self-pay | Admitting: Obstetrics & Gynecology

## 2021-08-26 ENCOUNTER — Encounter: Payer: BC Managed Care – PPO | Admitting: Obstetrics & Gynecology

## 2021-08-31 ENCOUNTER — Encounter: Payer: Self-pay | Admitting: Family

## 2021-09-02 ENCOUNTER — Encounter: Payer: Self-pay | Admitting: Obstetrics & Gynecology

## 2021-09-02 ENCOUNTER — Ambulatory Visit (INDEPENDENT_AMBULATORY_CARE_PROVIDER_SITE_OTHER): Payer: BC Managed Care – PPO | Admitting: Obstetrics & Gynecology

## 2021-09-02 VITALS — BP 91/63 | HR 73 | Wt 229.0 lb

## 2021-09-02 DIAGNOSIS — Z90722 Acquired absence of ovaries, bilateral: Secondary | ICD-10-CM

## 2021-09-02 DIAGNOSIS — N951 Menopausal and female climacteric states: Secondary | ICD-10-CM

## 2021-09-02 MED ORDER — ESTRADIOL 0.05 MG/24HR TD PTWK: 0.0500 mg | MEDICATED_PATCH | TRANSDERMAL | 3 refills | Status: DC

## 2021-09-02 NOTE — Patient Instructions (Signed)

## 2021-09-02 NOTE — Progress Notes (Signed)
History:  ?41 y.o.LMP here today for 2 week post op check.Pt is s/p left oophorectomy on 08/18/2021.  Pt reports that she is doing well but, she is having hot flushes and she wants to start HRT. She is eating and passing stools without difficulty.    ? ?The following portions of the patient's history were reviewed and updated as appropriate: allergies, current medications, past family history, past medical history, past social history, past surgical history and problem list. ? ?Review of Systems:  ?Pertinent items are noted in HPI. ?   ?Objective:  ?Physical Exam ?BP 91/63   Pulse 73   Wt 229 lb (103.9 kg)   BMI 33.82 kg/m?  ? ?CONSTITUTIONAL: Well-developed, well-nourished female in no acute distress.  ?HENT:  Normocephalic, atraumatic ?EYES: Conjunctivae and EOM are normal. No scleral icterus.  ?NECK: Normal range of motion ?SKIN: Skin is warm and dry. No rash noted. Not diaphoretic.No pallor. ?[place]: Alert and oriented to person, place, and time. Normal coordination.  ?Abd: Soft, nontender and nondistended; her port sites are healing well. Marland Kitchen  ?Pelvic: deferred ? ?Labs and Imaging ?Surg path ?08/18/2021 ?FINAL MICROSCOPIC DIAGNOSIS:  ? ?A. OVARY, LEFT, OOPHORECTOMY:  ?-  Features consistent with benign corpus luteal cyst   ? ?Assessment & Plan:  ?2 week post op check following left oophorectomy on 08/18/2021. She is now in surgical menopause. Has been having hot flushes and want to discuss HRT. We initially discussed this in detail at her pre-op visit.  ? Doing well ? Reviewed her surg path.  ? Reviewed post op instructions and activities ? Gradual increase in activities ? May RTW with no restrictions.  ? F/u in 3 months or sooner prn ? Climara .'05mg'$  patch weekly. Pt to call if she is unable to tolerate the adhesive.  ? All questions answered.  ? ?Glenola Wheat L. Harraway-Smith, M.D., [place]  ?

## 2021-09-03 ENCOUNTER — Encounter: Payer: Self-pay | Admitting: Obstetrics & Gynecology

## 2021-09-04 ENCOUNTER — Ambulatory Visit: Payer: BC Managed Care – PPO | Admitting: Family

## 2021-09-04 ENCOUNTER — Encounter: Payer: Self-pay | Admitting: Family

## 2021-09-04 ENCOUNTER — Ambulatory Visit (INDEPENDENT_AMBULATORY_CARE_PROVIDER_SITE_OTHER): Payer: BC Managed Care – PPO | Admitting: Family

## 2021-09-04 VITALS — BP 100/58 | HR 58 | Resp 20 | Ht 69.0 in | Wt 230.8 lb

## 2021-09-04 DIAGNOSIS — M2550 Pain in unspecified joint: Secondary | ICD-10-CM | POA: Diagnosis not present

## 2021-09-04 DIAGNOSIS — M546 Pain in thoracic spine: Secondary | ICD-10-CM

## 2021-09-04 DIAGNOSIS — M549 Dorsalgia, unspecified: Secondary | ICD-10-CM | POA: Insufficient documentation

## 2021-09-04 HISTORY — DX: Dorsalgia, unspecified: M54.9

## 2021-09-04 MED ORDER — CYCLOBENZAPRINE HCL 5 MG PO TABS: ORAL_TABLET | ORAL | 0 refills | Status: DC

## 2021-09-04 MED ORDER — TRAZODONE HCL 100 MG PO TABS: ORAL_TABLET | ORAL | 1 refills | Status: DC

## 2021-09-04 MED ORDER — METHYLPREDNISOLONE 4 MG PO TBPK: ORAL_TABLET | ORAL | 0 refills | Status: DC

## 2021-09-04 NOTE — Progress Notes (Signed)
Subjective:     Patient ID: Kim Pham, female    DOB: [birth year], 41 y.o.   MRN: [id number]  Chief Complaint  Patient presents with   Back Pain   Knee Pain   Patient is in today to discuss joint pain.  She has left sided knee pain.  She had left Oophorectomy. She states that she has some thoracic back pain and neck problems.  Not improving despite heating pads. Hurts worse in the AM. She is scheduled to see chiropractor on Monday. Will see Dr. Estanislado Pandy (rheumatology in June).    Health Maintenance Due  Topic Date Due   COVID-19 Vaccine (5 - Booster) 04/07/2020    Past Medical History:  Diagnosis Date   Adnexal mass 02/24/2021   in epic seen on [place]   Amenorrhea 08/14/2021   Anxiety 08/14/2021   Chronic constipation 08/14/2021   Chronic headaches HISTORY OF MIGRAINES 61/60/7371   Complication of anesthesia 08/14/2021   PONV   Endometriosis    GERD (gastroesophageal reflux disease)    tums otc, prevacid prn   history of Chiari malformation    history of COVID 02/2021   LOW GRADE FEVER CONGESTION AND HEADACHE ALL SYMPTOMS RESOLVED   HISTORY OF MIGRAINES 08/14/2021   Insomnia 08/19/2020   Low blood pressure    pt states history of low blood pressure-fainted 12/2010-instructed to move slowly   OSTEOARTHRITIS 08/14/2021   SLE (systemic lupus erythematosus) ([place]) 03/26/2016   Wears glasses for reading 08/14/2021    Past Surgical History:  Procedure Laterality Date   ABDOMINAL HYSTERECTOMY  2007   CYSTOSCOPY  2007   WITH HYSTERECTOMY   DIAGNOSTIC LAPAROSCOPY  2005   ROTATOR CUFF REPAIR Right 2010   SALPINGOOPHORECTOMY  04/16/2011   Procedure: SALPINGO OOPHERECTOMY;  Surgeon: Lovenia Kim, MD;  Location: [facility];  Service: Gynecology;  Laterality: Right;   skull decompression surgery 2005     @ [place] History  Problem Relation Age of Onset   Hypertension Mother    CAD Mother    Breast cancer Mother    Migraines Mother    Gout Mother     Heart disease Mother    Crohn's disease Mother    Hypertension Father    CAD Father    CVA Father    Heart disease Father    Sjogren's syndrome Sister    Celiac disease Sister    Rheum arthritis Maternal Grandmother    Autism Son    ADD / ADHD Son    Pancreatic cancer Maternal Grandfather    Breast cancer Maternal Aunt     Social History   Socioeconomic History   Marital status: Married    Spouse name: Not on file   Number of children: 1   Years of education: Assoc   Highest education level: Not on file  Occupational History   Occupation: nurse  Tobacco Use   Smoking status: Former    Packs/day: 1.00    Years: 10.00    Pack years: 10.00    Types: Cigarettes    Quit date: 04/04/2004    Years since quitting: 17.4   Smokeless tobacco: Never  Vaping Use   Vaping Use: Never used  Substance and Sexual Activity   Alcohol use: Not Currently   Drug use: Never   Sexual activity: Yes    Birth control/protection: Surgical  Other Topics Concern   Not on file  Social History Narrative   Works as an Corporate treasurer @  [employer]   Married   1 son- born 2004 has autism   Enjoys exercise, special olympics with son   Drinks 4-5 cups of coffee a day       Right handed   Social Determinants of Health   Financial Resource Strain: Not on file  Food Insecurity: Not on file  Transportation Needs: Not on file  Physical Activity: Not on file  Stress: Not on file  Social Connections: Not on file  Intimate Partner Violence: Not on file    Outpatient Medications Prior to Visit  Medication Sig Dispense Refill   estradiol (CLIMARA) 0.05 mg/24hr patch Place 1 patch (0.05 mg total) onto the skin once a week. 4 patch 3   fluticasone (FLONASE) 50 MCG/ACT nasal spray Place 2 sprays into both nostrils daily as needed for allergies or rhinitis. 16 g 1   gabapentin (NEURONTIN) 300 MG capsule Take 1 capsule (300 mg total) by mouth at bedtime. 90 capsule 1   omeprazole (PRILOSEC) 40 MG capsule  Take 1 capsule (40 mg total) by mouth daily. 30 capsule 3   PARoxetine (PAXIL) 20 MG tablet Take 1 tablet (20 mg total) by mouth at bedtime. 90 tablet 3   SUMAtriptan (IMITREX) 100 MG tablet Take 1 tablet (100 mg total) by mouth at onset of migraine. May repeat in 2 hours if headache persists or recurs. Do not take more than 2 doses in 24 hours. 10 tablet 2   diclofenac (VOLTAREN) 50 MG EC tablet Take 1 tablet (50 mg total) by mouth 3 (three) times daily as needed. (Patient not taking: Reported on 07/29/2021) 90 tablet 3   HYDROcodone-acetaminophen (NORCO/VICODIN) 5-325 MG tablet Take 1 tablet by mouth every 6 (six) hours as needed for moderate pain or severe pain. (Patient not taking: Reported on 09/02/2021) 10 tablet 0   traMADol (ULTRAM) 50 MG tablet Take 1 tablet (50 mg total) by mouth every 6 (six) hours as needed for severe pain. (Patient not taking: Reported on 09/02/2021) 20 tablet 0   traZODone (DESYREL) 100 MG tablet TAKE TWO TABLETS BY MOUTH EVERY NIGHT AT BEDTIME (Patient not taking: Reported on 09/04/2021) 180 tablet 1   No facility-administered medications prior to visit.    Allergies  Allergen Reactions   Effexor [Venlafaxine] Nausea Only    insomnia   Tramadol Nausea And Vomiting   Norco [Hydrocodone-Acetaminophen] Rash   Voltaren [Diclofenac] Rash    Review of Systems  Musculoskeletal:  Positive for back pain.      Objective:    Physical Exam Constitutional:      General: She is not in acute distress.    Appearance: Normal appearance. She is well-developed.  HENT:     Head: Normocephalic and atraumatic.     Right Ear: External ear normal.     Left Ear: External ear normal.  Eyes:     General: No scleral icterus. Neck:     Thyroid: No thyromegaly.  Cardiovascular:     Rate and Rhythm: Normal rate and regular rhythm.     Heart sounds: Normal heart sounds. No murmur heard. Pulmonary:     Effort: Pulmonary effort is normal. No respiratory distress.     Breath  sounds: Normal breath sounds. No wheezing.  Musculoskeletal:     Cervical back: Neck supple. No tenderness.     Thoracic back: Tenderness present.     Lumbar back: No tenderness.     Comments: No visible joint swelling redness   Skin:    General:  Skin is warm and dry.  Neurological:     Mental Status: She is alert and oriented to person, place, and time.  Psychiatric:        Mood and Affect: Mood normal.        Behavior: Behavior normal.        Thought Content: Thought content normal.        Judgment: Judgment normal.    BP (!) 100/58 (BP Location: Left Arm, Patient Position: Sitting, Cuff Size: Normal)   Pulse (!) 58   Resp 20   Ht '5\' 9"'$  (1.753 m)   Wt 230 lb 12.8 oz (104.7 kg)   SpO2 100%   BMI 34.08 kg/m  Wt Readings from Last 3 Encounters:  09/04/21 230 lb 12.8 oz (104.7 kg)  09/02/21 229 lb (103.9 kg)  08/18/21 228 lb 14.4 oz (103.8 kg)       Assessment & Plan:   Problem List Items Addressed This Visit       Unprioritized   Back pain    New/worsened since surgery. Will rx with a medrol dose pak.  Flexeril HS prn.        Relevant Medications   cyclobenzaprine (FLEXERIL) 5 MG tablet   methylPREDNISolone (MEDROL DOSEPAK) 4 MG TBPK tablet   Arthralgia - Primary    ? Due to SLE flare +/- osteoarthritis. Either way, I think the medrol dose pak should help her pain while she waits to get back in with her Rheumatologist.         I have discontinued Terrika Zuver. Espy's diclofenac, HYDROcodone-acetaminophen, and traMADol. I am also having her start on cyclobenzaprine and methylPREDNISolone. Additionally, I am having her maintain her SUMAtriptan, omeprazole, fluticasone, gabapentin, PARoxetine, estradiol, and traZODone.  Meds ordered this encounter  Medications   traZODone (DESYREL) 100 MG tablet    Sig: TAKE TWO TABLETS BY MOUTH EVERY NIGHT AT BEDTIME    Dispense:  180 tablet    Refill:  1    Order Specific Question:   Supervising Provider    Answer:   Penni Homans A [[id number]]   cyclobenzaprine (FLEXERIL) 5 MG tablet    Sig: 1-2 tabs by mouth at bedtime as needed for back pain.    Dispense:  30 tablet    Refill:  0    Order Specific Question:   Supervising Provider    Answer:   Penni Homans A [[id number]]   methylPREDNISolone (MEDROL DOSEPAK) 4 MG TBPK tablet    Sig: Take per package instructions    Dispense:  21 tablet    Refill:  0    Order Specific Question:   Supervising Provider    Answer:   Penni Homans A [[id number]]

## 2021-09-04 NOTE — Assessment & Plan Note (Signed)
New/worsened since surgery. Will rx with a medrol dose pak.  Flexeril HS prn.

## 2021-09-04 NOTE — Patient Instructions (Signed)
Please begin medrol dose pak. You may use flexeril at bedtime as needed. Keep your upcoming appointment with Dr. Estanislado Pandy.

## 2021-09-04 NOTE — Assessment & Plan Note (Signed)
?   Due to SLE flare +/- osteoarthritis. Either way, I think the medrol dose pak should help her pain while she waits to get back in with her Rheumatologist.

## 2021-09-07 ENCOUNTER — Encounter: Payer: Self-pay | Admitting: Obstetrics & Gynecology

## 2021-09-07 DIAGNOSIS — M542 Cervicalgia: Secondary | ICD-10-CM | POA: Diagnosis not present

## 2021-09-07 DIAGNOSIS — M9903 Segmental and somatic dysfunction of lumbar region: Secondary | ICD-10-CM | POA: Diagnosis not present

## 2021-09-07 DIAGNOSIS — M9901 Segmental and somatic dysfunction of cervical region: Secondary | ICD-10-CM | POA: Diagnosis not present

## 2021-09-07 DIAGNOSIS — M5417 Radiculopathy, lumbosacral region: Secondary | ICD-10-CM | POA: Diagnosis not present

## 2021-09-08 ENCOUNTER — Encounter: Payer: Self-pay | Admitting: Family

## 2021-09-08 ENCOUNTER — Ambulatory Visit (INDEPENDENT_AMBULATORY_CARE_PROVIDER_SITE_OTHER): Payer: BC Managed Care – PPO | Admitting: Family

## 2021-09-08 ENCOUNTER — Encounter: Payer: Self-pay | Admitting: Gastroenterology

## 2021-09-08 VITALS — BP 91/65 | HR 68 | Temp 98.3°F | Resp 16 | Ht 69.0 in | Wt 226.0 lb

## 2021-09-08 DIAGNOSIS — M329 Systemic lupus erythematosus, unspecified: Secondary | ICD-10-CM | POA: Diagnosis not present

## 2021-09-08 DIAGNOSIS — K921 Melena: Secondary | ICD-10-CM

## 2021-09-08 DIAGNOSIS — Z8342 Family history of familial hypercholesterolemia: Secondary | ICD-10-CM

## 2021-09-08 DIAGNOSIS — Z0000 Encounter for general adult medical examination without abnormal findings: Secondary | ICD-10-CM

## 2021-09-08 DIAGNOSIS — E785 Hyperlipidemia, unspecified: Secondary | ICD-10-CM | POA: Insufficient documentation

## 2021-09-08 HISTORY — DX: Melena: K92.1

## 2021-09-08 HISTORY — DX: Encounter for general adult medical examination without abnormal findings: Z00.00

## 2021-09-08 LAB — CBC WITH DIFFERENTIAL/PLATELET
Basophils Absolute: 0 10*3/uL (ref 0.0–0.1)
Basophils Relative: 0.7 % (ref 0.0–3.0)
Eosinophils Absolute: 0 10*3/uL (ref 0.0–0.7)
Eosinophils Relative: 0.9 % (ref 0.0–5.0)
HCT: 39.3 % (ref 36.0–46.0)
Hemoglobin: 12.8 g/dL (ref 12.0–15.0)
Lymphocytes Relative: 44.7 % (ref 12.0–46.0)
Lymphs Abs: 2.1 10*3/uL (ref 0.7–4.0)
MCHC: 32.6 g/dL (ref 30.0–36.0)
MCV: 87.2 fl (ref 78.0–100.0)
Monocytes Absolute: 0.3 10*3/uL (ref 0.1–1.0)
Monocytes Relative: 6.9 % (ref 3.0–12.0)
Neutro Abs: 2.2 10*3/uL (ref 1.4–7.7)
Neutrophils Relative %: 46.8 % (ref 43.0–77.0)
Platelets: 325 10*3/uL (ref 150.0–400.0)
RBC: 4.51 Mil/uL (ref 3.87–5.11)
RDW: 14.4 % (ref 11.5–15.5)
WBC: 4.8 10*3/uL (ref 4.0–10.5)

## 2021-09-08 LAB — COMPREHENSIVE METABOLIC PANEL
ALT: 22 U/L (ref 0–35)
AST: 13 U/L (ref 0–37)
Albumin: 4.5 g/dL (ref 3.5–5.2)
Alkaline Phosphatase: 83 U/L (ref 39–117)
BUN: 16 mg/dL (ref 6–23)
CO2: 32 mEq/L (ref 19–32)
Calcium: 9.7 mg/dL (ref 8.4–10.5)
Chloride: 102 mEq/L (ref 96–112)
Creatinine, Ser: 0.93 mg/dL (ref 0.40–1.20)
GFR: 76.4 mL/min (ref >60.00)
Glucose, Bld: 88 mg/dL (ref 70–99)
Potassium: 4.4 mEq/L (ref 3.5–5.1)
Sodium: 141 mEq/L (ref 135–145)
Total Bilirubin: 0.5 mg/dL (ref 0.2–1.2)
Total Protein: 7 g/dL (ref 6.0–8.3)

## 2021-09-08 LAB — LIPID PANEL
Cholesterol: 273 mg/dL — ABNORMAL HIGH (ref 0–200)
HDL: 79.9 mg/dL (ref >39.00)
LDL Cholesterol: 176 mg/dL — ABNORMAL HIGH (ref 0–99)
NonHDL: 193.37
Total CHOL/HDL Ratio: 3
Triglycerides: 87 mg/dL (ref 0.0–149.0)
VLDL: 17.4 mg/dL (ref 0.0–40.0)

## 2021-09-08 NOTE — Progress Notes (Addendum)
Subjective:   By signing my name below, I, Kim Pham, attest that this documentation has been prepared under the direction and in the presence of Kim Alar NP, 09/08/2021   Patient ID: Kim Pham, female    DOB: 12/24/1980, 41 y.o.   MRN: [id number]  Chief Complaint  Patient presents with   Annual Exam    HPI Patient is in today for a comprehensive physical exam.  Back Pain - She reports that her back pain is improving. She is taking 4 Mg TBPK of Medrol Dosepack and 5 Mg of Flexeril. She reports that the Medrol is improving her symptoms. She sees her specialist  regularly and reports that back pain is most likely due to bone rather than muscular.  Bowel Movements - She reports that she has off and on bowel movement symptoms. She states that the other day she had blood in her stool. She has seen blood in her stool about a "handful of times."  Headaches - She reports that she has been having headaches but symptoms could be due to external stressors.  Thyroid - Her thyroid functions/nodules are stable.  Cholesterol - She reports that her parents have high cholesterol.  She denies having any fever, ear pain, new muscle pain, joint pain, new moles, congestion, sinus pain, sore throat, palpations, wheezing, n/v/d, constipation, dysuria, frequency, hematuria, depresssion or anxiety at this time.  Social History: She denies of any changes to her family medical history. Paternal grandfather has had multiple strokes and passed away from it. Paternal grandmother has a history of CAD. Her maternal grandmother has a history of dementia.  Colonoscopy: Last completed on 08/22/2017 Mammogram: Last completed on 08/22/2020 Immunizations: She is interested in receiving the tetanus vaccine. She has received 3 Covid 19 vaccines. She is UTD on Influenza vaccine.  Diet: She is maintaining a healthy diet Exercise: She does 30 minute walks everyday    Health Maintenance Due  Topic Date  Due   COVID-19 Vaccine (5 - Booster) 04/07/2020    Past Medical History:  Diagnosis Date   Adnexal mass 02/24/2021   in epic seen on [place]   Amenorrhea 08/14/2021   Anxiety 08/14/2021   Chronic constipation 08/14/2021   Chronic headaches HISTORY OF MIGRAINES 57/84/6962   Complication of anesthesia 08/14/2021   PONV   Endometriosis    GERD (gastroesophageal reflux disease)    tums otc, prevacid prn   history of Chiari malformation    history of COVID 02/2021   LOW GRADE FEVER CONGESTION AND HEADACHE ALL SYMPTOMS RESOLVED   HISTORY OF MIGRAINES 08/14/2021   Insomnia 08/19/2020   Low blood pressure    pt states history of low blood pressure-fainted 12/2010-instructed to move slowly   OSTEOARTHRITIS 08/14/2021   SLE (systemic lupus erythematosus) ([place]) 03/26/2016   Wears glasses for reading 08/14/2021    Past Surgical History:  Procedure Laterality Date   ABDOMINAL HYSTERECTOMY  2007   CYSTOSCOPY  2007   WITH HYSTERECTOMY   DIAGNOSTIC LAPAROSCOPY  2005   ROTATOR CUFF REPAIR Right 2010   SALPINGOOPHORECTOMY  04/16/2011   Procedure: SALPINGO OOPHERECTOMY;  Surgeon: Lovenia Kim, MD;  Location: [facility];  Service: Gynecology;  Laterality: Right;   skull decompression surgery 2005     @ [facility]    Family History  Problem Relation Age of Onset   Hypertension Mother    CAD Mother    Breast cancer Mother    Migraines Mother    Gout Mother  Heart disease Mother    Crohn's disease Mother    Hypertension Father    CAD Father    CVA Father    Heart disease Father    Sjogren's syndrome Sister    Celiac disease Sister    Rheum arthritis Maternal Grandmother    Dementia Maternal Grandmother    Pancreatic cancer Maternal Grandfather    CAD Paternal Grandmother    Macular degeneration Paternal Grandmother    CVA Paternal Grandfather    Autism Son    ADD / ADHD Son    Breast cancer Maternal Aunt     Social History   Socioeconomic History   Marital status: Married     Spouse name: Not on file   Number of children: 1   Years of education: Assoc   Highest education level: Not on file  Occupational History   Occupation: nurse  Tobacco Use   Smoking status: Former    Packs/day: 1.00    Years: 10.00    Pack years: 10.00    Types: Cigarettes    Quit date: 04/04/2004    Years since quitting: 17.4   Smokeless tobacco: Never  Vaping Use   Vaping Use: Never used  Substance and Sexual Activity   Alcohol use: Not Currently   Drug use: Never   Sexual activity: Yes    Birth control/protection: Surgical  Other Topics Concern   Not on file  Social History Narrative   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 30 tablet 0   fluticasone (FLONASE) 50 MCG/ACT nasal spray Place 2 sprays into both nostrils daily as needed for allergies or rhinitis. 16 g 1   gabapentin (NEURONTIN) 300 MG capsule Take 1 capsule (300 mg total) by mouth at bedtime. 90 capsule 1   methylPREDNISolone (MEDROL DOSEPAK) 4 MG TBPK tablet Take per package instructions 21 tablet 0   omeprazole (PRILOSEC) 40 MG capsule Take 1 capsule (40 mg total) by mouth daily. 30 capsule 3   PARoxetine (PAXIL) 20 MG tablet Take 1 tablet (20 mg total) by mouth at bedtime. 90 tablet 3   SUMAtriptan (IMITREX) 100 MG tablet Take 1 tablet (100 mg total) by mouth at onset of migraine. May repeat in 2 hours if headache persists or recurs. Do not take  more than 2 doses in 24 hours. 10 tablet 2   traZODone (DESYREL) 100 MG tablet TAKE TWO TABLETS BY MOUTH EVERY NIGHT AT BEDTIME 180 tablet 1   estradiol (CLIMARA) 0.05 mg/24hr patch Place 1 patch (0.05 mg total) onto the skin once a week. 4 patch 3   No facility-administered medications prior to visit.    Allergies  Allergen Reactions   Effexor [Venlafaxine] Nausea Only    insomnia   Tramadol Nausea And Vomiting   Norco [Hydrocodone-Acetaminophen] Rash   Voltaren [Diclofenac] Rash  Review of Systems  Constitutional:  Negative for fever.  HENT:  Negative for congestion, ear pain, sinus pain and sore throat.   Respiratory:  Negative for wheezing.   Cardiovascular:  Negative for palpitations.  Gastrointestinal:  Positive for blood in stool. Negative for constipation, diarrhea, nausea and vomiting.  Genitourinary:  Negative for dysuria, frequency and hematuria.  Musculoskeletal:  Positive for back pain. Negative for joint pain and myalgias.  Skin:        (-) [place]  Neurological:  Positive for headaches.  Psychiatric/Behavioral:  Negative for depression. The patient is not nervous/anxious.       Objective:    Physical Exam Constitutional:      General: She is not in acute distress.    Appearance: Normal appearance. She is not ill-appearing.  HENT:     Head: Normocephalic and atraumatic.     Right Ear: Tympanic membrane, ear canal and external ear normal.     Left Ear: Tympanic membrane, ear canal and external ear normal.     Mouth/Throat:     Pharynx: No oropharyngeal exudate.  Eyes:     Extraocular Movements: Extraocular movements intact.     Pupils: Pupils are equal, round, and reactive to light.  Cardiovascular:     Rate and Rhythm: Normal rate and regular rhythm.     Heart sounds: Normal heart sounds. No murmur heard.   No gallop.  Pulmonary:     Effort: Pulmonary effort is normal. No respiratory distress.     Breath sounds: Normal breath sounds. No wheezing or  rales.  Chest:  Breasts:    Breasts are symmetrical.     Right: No inverted nipple or mass.     Left: No inverted nipple or mass.  Abdominal:     General: Bowel sounds are normal. There is no distension.     Palpations: Abdomen is soft.     Tenderness: There is no abdominal tenderness. There is no guarding.  Musculoskeletal:     Comments: 5/5 strength in both upper and lower extremities    Lymphadenopathy:     Cervical: No cervical adenopathy.  Skin:    General: Skin is warm and dry.  Neurological:     Mental Status: She is alert and oriented to person, place, and time.     Deep Tendon Reflexes:     Reflex Scores:      Patellar reflexes are 2+ on the right side and 2+ on the left side. Psychiatric:        Mood and Affect: Mood normal.        Behavior: Behavior normal.        Judgment: Judgment normal.    BP 91/65 (BP Location: Right Arm, Patient Position: Sitting, Cuff Size: Large)   Pulse 68   Temp 98.3 F (36.8 C) (Oral)   Resp 16   Ht '5\' 9"'$  (1.753 m)   Wt 226 lb (102.5 kg)   SpO2 99%   BMI 33.37 kg/m  Wt Readings from Last 3 Encounters:  09/08/21 226 lb (102.5 kg)  09/04/21 230 lb 12.8 oz (104.7 kg)  09/02/21 229 lb (103.9 kg)       Assessment & Plan:    Preventative care- discussed diet/exercise/weight loss.  Tdap today. She is advised to schedule follow up with GI re: her rectal bleeding. Recommended she consider getting the bivalent covid booster. S/p hysterectomy. No need for pap. Refer for updated mammogram.     No orders of the defined types  were placed in this encounter.   I, Nance Pear, NP, personally preformed the services described in this documentation.  All medical record entries made by the scribe were at my direction and in my presence.  I have reviewed the chart and discharge instructions (if applicable) and agree that the record reflects my personal performance and is accurate and complete. 09/08/2021   I,Amber Collins,acting as a  scribe for Nance Pear, NP.,have documented all relevant documentation on the behalf of Nance Pear, NP,as directed by  Nance Pear, NP while in the presence of Nance Pear, NP.    Nance Pear, NP

## 2021-09-08 NOTE — Patient Instructions (Signed)
Please complete lab work prior to leaving.  Continue your work on healthy diet, exercise and weight loss.  

## 2021-09-09 ENCOUNTER — Other Ambulatory Visit: Payer: Self-pay | Admitting: Obstetrics & Gynecology

## 2021-09-09 DIAGNOSIS — E8940 Asymptomatic postprocedural ovarian failure: Secondary | ICD-10-CM

## 2021-09-09 MED ORDER — ESTRADIOL 0.5 MG PO TABS: 0.5000 mg | ORAL_TABLET | Freq: Every day | ORAL | 3 refills | Status: DC

## 2021-09-10 DIAGNOSIS — M5417 Radiculopathy, lumbosacral region: Secondary | ICD-10-CM | POA: Diagnosis not present

## 2021-09-10 DIAGNOSIS — M9903 Segmental and somatic dysfunction of lumbar region: Secondary | ICD-10-CM | POA: Diagnosis not present

## 2021-09-10 DIAGNOSIS — M542 Cervicalgia: Secondary | ICD-10-CM | POA: Diagnosis not present

## 2021-09-10 DIAGNOSIS — M9901 Segmental and somatic dysfunction of cervical region: Secondary | ICD-10-CM | POA: Diagnosis not present

## 2021-09-11 ENCOUNTER — Encounter: Payer: Self-pay | Admitting: Rheumatology

## 2021-09-16 DIAGNOSIS — M9903 Segmental and somatic dysfunction of lumbar region: Secondary | ICD-10-CM | POA: Diagnosis not present

## 2021-09-16 DIAGNOSIS — M5417 Radiculopathy, lumbosacral region: Secondary | ICD-10-CM | POA: Diagnosis not present

## 2021-09-16 DIAGNOSIS — M9901 Segmental and somatic dysfunction of cervical region: Secondary | ICD-10-CM | POA: Diagnosis not present

## 2021-09-16 DIAGNOSIS — M542 Cervicalgia: Secondary | ICD-10-CM | POA: Diagnosis not present

## 2021-09-17 NOTE — Progress Notes (Signed)
Office Visit Note  Patient: Kim Pham             Date of Birth: 1981-01-15           MRN: [id number]             PCP: Debbrah Alar, NP Referring: Debbrah Alar, NP Visit Date: 09/29/2021 Occupation: _0 @  Subjective:  Pain in multiple joints and fatigue  History of Present Illness: Kim Pham is a 41 y.o. female returns today after her last visit on July 05, 2017.  At the time she was diagnosed with possible autoimmune disease with history of positive ANA, fatigue, oral ulcers, sicca symptoms, arthralgias, photosensitivity, Raynaud's and hair loss.  She was placed on hydroxychloroquine 200 mg p.o. twice daily Monday to Friday.  Patient states that she took hydroxychloroquine for about 3 to 6 months.  She states that due to loss of insurance she quit taking hydroxychloroquine in December 2019.  During the last 3 years she had 3 episodes of COVID-19 virus infection.  She was treated with Paxlovid all 3 times.  She continues to have fatigue, oral ulcers, sicca symptoms, photosensitivity, Raynaud's and hair loss.  She also complains of pain in multiple joints.  She states that she was having increased pain and discomfort in her knee joints for which she was seen by Dr. Durward Fortes.  He diagnosed her with chondromalacia patella and she underwent right knee joint arthroscopic surgery in August 2022.  She continues to have knee joint discomfort.  She has been also experiencing lower back pain for about 8 months.  She states the pain radiates to her right gluteal region.  She is also experiencing nocturnal pain.  She has occasional shortness of breath.  She also runs low-grade fever with temperature up to 100.7.  She has had some visual changes which she describes as blurred vision and she notices eye twitching.  She describes some discomfort in bilateral plantar fascia.  Activities of Daily Living:  Patient reports morning stiffness for 1 hour.   Patient Reports nocturnal  pain.  Difficulty dressing/grooming: Denies Difficulty climbing stairs: Denies Difficulty getting out of chair: Reports Difficulty using hands for taps, buttons, cutlery, and/or writing: Reports  Review of Systems  Constitutional:  Positive for fatigue.  HENT:  Positive for mouth dryness.   Eyes:  Positive for visual disturbance and dryness.  Respiratory:  Negative for difficulty breathing.   Cardiovascular:  Positive for swelling in legs/feet.  Gastrointestinal:  Positive for constipation and diarrhea.  Endocrine: Positive for cold intolerance.  Genitourinary:  Negative for difficulty urinating.  Musculoskeletal:  Positive for joint pain, gait problem, joint pain, joint swelling, muscle weakness, morning stiffness and muscle tenderness.  Skin:  Positive for color change, rash and sensitivity to sunlight.  Allergic/Immunologic: Positive for susceptible to infections.  Neurological:  Positive for weakness.  Hematological:  Negative for bruising/bleeding tendency and swollen glands.  Psychiatric/Behavioral:  Positive for sleep disturbance.     PMFS History:  Patient Active Problem List   Diagnosis Date Noted   Preventative health care 09/08/2021   Blood in stool 09/08/2021   Hyperlipidemia 09/08/2021   Back pain 09/04/2021   Pelvic pain in female 08/18/2021   Chondromalacia, patella 06/30/2021   Family history of BRCA gene mutation 06/17/2021   Cyst of left ovary 06/17/2021   Vasomotor symptoms due to menopause 06/17/2021   Rectal bleeding 06/11/2021   Adnexal mass 06/11/2021   Hot flashes 06/11/2021   Constipation 06/11/2021  Right lower quadrant abdominal pain 06/05/2021   Pain in right knee 11/13/2020   Insomnia 08/19/2020   Gastroesophageal reflux disease 08/19/2020   Psychogenic nonepileptic seizure 07/27/2017   Chronic migraine without aura, with intractable migraine, so stated, with status migrainosus 07/27/2017   Diarrhea 07/08/2017   Nonepileptic episode ([place])  12/22/2016   Sicca syndrome ([place]) 11/22/2016   Raynaud's disease without gangrene 11/22/2016   Hair loss 11/22/2016   Photosensitivity 11/22/2016   History of Chiari malformation 11/22/2016   High risk medication use 11/22/2016   Arthralgia 08/18/2016   Fatigue 03/26/2016   SLE (systemic lupus erythematosus) ([place]) 03/26/2016   Depression with anxiety 03/26/2016   Numbness and tingling 02/25/2016   Transient alteration of awareness 01/01/2016   Chiari malformation 12/19/2015    Past Medical History:  Diagnosis Date   Adnexal mass 02/24/2021   in epic seen on [place]   Amenorrhea 08/14/2021   Anxiety 08/14/2021   Chronic constipation 08/14/2021   Chronic headaches HISTORY OF MIGRAINES 37/90/2409   Complication of anesthesia 08/14/2021   PONV   Endometriosis    GERD (gastroesophageal reflux disease)    tums otc, prevacid prn   history of Chiari malformation    history of COVID 02/2021   LOW GRADE FEVER CONGESTION AND HEADACHE ALL SYMPTOMS RESOLVED   HISTORY OF MIGRAINES 08/14/2021   Hyperlipidemia    Insomnia 08/19/2020   Low blood pressure    pt states history of low blood pressure-fainted 12/2010-instructed to move slowly   OSTEOARTHRITIS 08/14/2021   SLE (systemic lupus erythematosus) ([place]) 03/26/2016   Wears glasses for reading 08/14/2021    Family History  Problem Relation Age of Onset   Hypertension Mother    CAD Mother    Breast cancer Mother    Migraines Mother    Gout Mother    Heart disease Mother    Crohn's disease Mother    Hypertension Father    CAD Father    CVA Father    Heart disease Father    Sjogren's syndrome Sister    Celiac disease Sister    Rheum arthritis Maternal Grandmother    Dementia Maternal Grandmother    Pancreatic cancer Maternal Grandfather    CAD Paternal Grandmother    Macular degeneration Paternal Grandmother    CVA Paternal Grandfather    Autism Son    ADD / ADHD Son    Breast cancer Maternal Aunt    Past Surgical History:   Procedure Laterality Date   ABDOMINAL HYSTERECTOMY  2007   CYSTOSCOPY  2007   WITH HYSTERECTOMY   DIAGNOSTIC LAPAROSCOPY  2005   OOPHORECTOMY Left 08/18/2021   ROTATOR CUFF REPAIR Right 2010   SALPINGOOPHORECTOMY  04/16/2011   Procedure: SALPINGO OOPHERECTOMY;  Surgeon: Lovenia Kim, MD;  Location: 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   Married   1 son- born 2004 has autism   Enjoys exercise, special olympics with son   Drinks 4-5 cups of coffee a day       Right handed   Immunization History  Administered Date(s) Administered   Influenza,inj,Quad PF,6+ Mos 01/26/2018   Influenza-Unspecified 03/25/2020, 01/17/2021   Moderna Sars-Covid-2 Vaccination 02/11/2020   PFIZER(Purple Top)SARS-COV-2 Vaccination 07/23/2019, 08/14/2019, 02/11/2020   Td 10/18/2011     Objective: Vital Signs: BP 114/73 (BP Location: Right Arm,  Patient Position: Sitting, Cuff Size: Large)   Pulse (!) 101   Resp 12   Ht 5' 8.75" (1.746 m)   Wt 231 lb 6.4 oz (105 kg)   BMI 34.42 kg/m    Physical Exam Vitals and nursing note reviewed.  Constitutional:      Appearance: She is well-developed.  HENT:     Head: Normocephalic and atraumatic.  Eyes:     Conjunctiva/sclera: Conjunctivae normal.  Cardiovascular:     Rate and Rhythm: Normal rate and regular rhythm.     Heart sounds: Normal heart sounds.  Pulmonary:     Effort: Pulmonary effort is normal.     Breath sounds: Normal breath sounds.  Abdominal:     General: Bowel sounds are normal.     Palpations: Abdomen is soft.  Musculoskeletal:     Cervical back: Normal range of motion.  Lymphadenopathy:     Cervical: No cervical adenopathy.  Skin:    General: Skin is warm and dry.     Capillary Refill: Capillary refill takes less than 2 seconds.  Neurological:     Mental Status: She is alert and  oriented to person, place, and time.  Psychiatric:        Behavior: Behavior normal.      Musculoskeletal Exam: C-spine thoracic and lumbar spine were in good range of motion.  She had discomfort range of motion of the lumbar spine.  There was no point tenderness.  She had tenderness over bilateral trapezius region and gluteal region.  She had tenderness over bilateral trochanteric area.  Shoulder joints, elbow joints, wrist joints, MCPs PIPs and DIPs with good range of motion.  She had tenderness over bilateral epicondyle region.  Hip joints, knee joints, ankles and MTPs with good range of motion with no synovitis.  She had warmth on palpation of her right knee joint.  She had generalized hyperalgesia and positive tender points.  CDAI Exam: CDAI Score: -- Patient Global: --; Provider Global: -- Swollen: --; Tender: -- Joint Exam 09/29/2021   No joint exam has been documented for this visit   There is currently no information documented on the homunculus. Go to the Rheumatology activity and complete the homunculus joint exam.  Investigation: No additional findings.  Imaging: No results found.  Recent Labs: Lab Results  Component Value Date   WBC 4.8 09/08/2021   HGB 12.8 09/08/2021   PLT 325.0 09/08/2021   NA 141 09/08/2021   K 4.4 09/08/2021   CL 102 09/08/2021   CO2 32 09/08/2021   GLUCOSE 88 09/08/2021   BUN 16 09/08/2021   CREATININE 0.93 09/08/2021   BILITOT 0.5 09/08/2021   ALKPHOS 83 09/08/2021   AST 13 09/08/2021   ALT 22 09/08/2021   PROT 7.0 09/08/2021   ALBUMIN 4.5 09/08/2021   CALCIUM 9.7 09/08/2021   GFRAA >60 07/14/2018  June 05, 2021 UA negative,  Speciality Comments: No specialty comments available. IMPRESSION:  Negative MRI lumbar spine. L5 is partially incorporated into the  sacrum, a normal variant.    Electronically Signed    By: Franchot Pham M.D.    On: 11/12/2019 09:05  Procedures:  No procedures performed Allergies: Effexor  [venlafaxine], Tramadol, Norco [hydrocodone-acetaminophen], and Voltaren [diclofenac]   Assessment / Plan:     Visit Diagnoses: Autoimmune disease ([place]) - 1:320 homogeneous, h/oFatigue, oral ulcers, sicca symptoms, arthralgias, photosensitivity, Raynaud's, hair loss.  -Patient was seen last in 2019 for possible autoimmune disease.  She states that she discontinued hydroxychloroquine  after 3 months as she ran out of insurance.  She continues to have similar symptoms with infrequent oral ulcers, fatigue, sicca symptoms, arthralgias, photosensitivity, Raynaud's and hair loss.  She also gives history of intermittent joint swelling.  No synovitis was noted.  She had good capillary refill with no sclerodactyly or nailbed capillary changes.  Her symptoms are mostly subjective.  I will obtain autoimmune labs today.  Plan: ANA, Anti-scleroderma antibody, RNP Antibody, Anti-Smith antibody, Sjogrens syndrome-A extractable nuclear antibody, Sjogrens syndrome-B extractable nuclear antibody, Anti-DNA antibody, double-stranded, C3 and C4, Beta-2 glycoprotein antibodies, Cardiolipin antibodies, IgG, IgM, IgA, Lupus Anticoagulant Eval w/Reflex  High risk medication use -(PLQ 200 mg by mouth BID M-F.)-She took Plaquenil for 3 months and then discontinued due to insurance issues.  I advised her to get an eye examination as she complains of blurred vision and eye twitching.  Polyarthralgia-she complains of pain and discomfort in multiple joints.  She had multiple tender points and hyperalgesia which raises concern about fibromyalgia.Plan: Rheumatoid factor, Cyclic citrul peptide antibody, IgG  Sicca complex (HCC)-she complains of dry mouth and dry eyes.  Raynaud's syndrome without gangrene-she complains of Raynaud's Phenomenon.  No nailbed capillary changes, sclerodactyly, Telengectesia's or sclerodactyly was noted.  Hair loss-she states she has been having ongoing hair loss over the last few years.  She gives history of  generalized hair thinning.  Photosensitivity-she gives history of photosensitivity.  No malar rash was noted.  Chondromalacia of both patellae - Status post right knee joint arthroscopic surgery August 2022 by Dr. Durward Fortes.  She has ongoing warmth and inflammation in her right knee joint.  She complains of discomfort in her bilateral knee joints.  X-rays obtained by Dr. Durward Fortes reviewed bilateral chondromalacia patella.  Chronic midline low back pain without sciatica-she has been experiencing increased pain and discomfort in her lower back.  She denies any radiculopathy but she has discomfort in her right gluteal region.  I reviewed MRI of her lumbar spine from 2021 which showed sacralization of L5 vertebrae otherwise unremarkable.  Fibromyalgia syndrome-she has been experiencing generalized pain and discomfort.  She had multiple tender points.  She also gives history of fatigue and insomnia.  She would benefit from going to integrative therapies.  We will make a referral.  Other fatigue - Plan: CK, TSH  Vitamin D deficiency -she has been experiencing increased fatigue.  Plan: VITAMIN D 25 Hydroxy (Vit-D Deficiency, Fractures)  History of Chiari malformation  History of gastroesophageal reflux (GERD)  History of depression  Family history of rheumatoid arthritis-mother   Orders: Orders Placed This Encounter  Procedures   CK   TSH   VITAMIN D 25 Hydroxy (Vit-D Deficiency, Fractures)   Rheumatoid factor   Cyclic citrul peptide antibody, IgG   ANA   Anti-scleroderma antibody   RNP Antibody   Anti-Smith antibody   Sjogrens syndrome-A extractable nuclear antibody   Sjogrens syndrome-B extractable nuclear antibody   Anti-DNA antibody, double-stranded   C3 and C4   Beta-2 glycoprotein antibodies   Cardiolipin antibodies, IgG, IgM, IgA   Lupus Anticoagulant Eval w/Reflex   No orders of the defined types were placed in this encounter.  minutes.    Follow-Up Instructions:  Return for Pain in multiple joints and fatigue positive ANA, fatigue.   Bo Merino, MD  Note - This record has been created using Editor, commissioning.  Chart creation errors have been sought, but may not always  have been located. Such creation errors do not reflect on  the standard of  medical care.

## 2021-09-22 DIAGNOSIS — M9901 Segmental and somatic dysfunction of cervical region: Secondary | ICD-10-CM | POA: Diagnosis not present

## 2021-09-22 DIAGNOSIS — M9903 Segmental and somatic dysfunction of lumbar region: Secondary | ICD-10-CM | POA: Diagnosis not present

## 2021-09-22 DIAGNOSIS — M542 Cervicalgia: Secondary | ICD-10-CM | POA: Diagnosis not present

## 2021-09-22 DIAGNOSIS — M5417 Radiculopathy, lumbosacral region: Secondary | ICD-10-CM | POA: Diagnosis not present

## 2021-09-28 ENCOUNTER — Other Ambulatory Visit (HOSPITAL_BASED_OUTPATIENT_CLINIC_OR_DEPARTMENT_OTHER): Payer: Self-pay | Admitting: Family

## 2021-09-28 DIAGNOSIS — Z1231 Encounter for screening mammogram for malignant neoplasm of breast: Secondary | ICD-10-CM

## 2021-09-29 ENCOUNTER — Encounter: Payer: Self-pay | Admitting: Rheumatology

## 2021-09-29 ENCOUNTER — Ambulatory Visit (INDEPENDENT_AMBULATORY_CARE_PROVIDER_SITE_OTHER): Payer: BC Managed Care – PPO | Admitting: Rheumatology

## 2021-09-29 VITALS — BP 114/73 | HR 101 | Resp 12 | Ht 68.75 in | Wt 231.4 lb

## 2021-09-29 DIAGNOSIS — Z8719 Personal history of other diseases of the digestive system: Secondary | ICD-10-CM

## 2021-09-29 DIAGNOSIS — Z8669 Personal history of other diseases of the nervous system and sense organs: Secondary | ICD-10-CM

## 2021-09-29 DIAGNOSIS — I7300 Raynaud's syndrome without gangrene: Secondary | ICD-10-CM

## 2021-09-29 DIAGNOSIS — M2242 Chondromalacia patellae, left knee: Secondary | ICD-10-CM

## 2021-09-29 DIAGNOSIS — M2550 Pain in unspecified joint: Secondary | ICD-10-CM

## 2021-09-29 DIAGNOSIS — M722 Plantar fascial fibromatosis: Secondary | ICD-10-CM

## 2021-09-29 DIAGNOSIS — M3500 Sicca syndrome, unspecified: Secondary | ICD-10-CM

## 2021-09-29 DIAGNOSIS — M2241 Chondromalacia patellae, right knee: Secondary | ICD-10-CM

## 2021-09-29 DIAGNOSIS — R5383 Other fatigue: Secondary | ICD-10-CM

## 2021-09-29 DIAGNOSIS — M359 Systemic involvement of connective tissue, unspecified: Secondary | ICD-10-CM

## 2021-09-29 DIAGNOSIS — M545 Low back pain, unspecified: Secondary | ICD-10-CM

## 2021-09-29 DIAGNOSIS — G8929 Other chronic pain: Secondary | ICD-10-CM

## 2021-09-29 DIAGNOSIS — E559 Vitamin D deficiency, unspecified: Secondary | ICD-10-CM

## 2021-09-29 DIAGNOSIS — Z8659 Personal history of other mental and behavioral disorders: Secondary | ICD-10-CM

## 2021-09-29 DIAGNOSIS — L659 Nonscarring hair loss, unspecified: Secondary | ICD-10-CM

## 2021-09-29 DIAGNOSIS — Z8261 Family history of arthritis: Secondary | ICD-10-CM

## 2021-09-29 DIAGNOSIS — M797 Fibromyalgia: Secondary | ICD-10-CM

## 2021-09-29 DIAGNOSIS — Z79899 Other long term (current) drug therapy: Secondary | ICD-10-CM | POA: Diagnosis not present

## 2021-09-29 DIAGNOSIS — L568 Other specified acute skin changes due to ultraviolet radiation: Secondary | ICD-10-CM

## 2021-09-29 DIAGNOSIS — G4709 Other insomnia: Secondary | ICD-10-CM

## 2021-09-29 NOTE — Addendum Note (Signed)
Addended by: Carole Binning on: 09/29/2021 11:22 AM   Modules accepted: Orders

## 2021-09-30 ENCOUNTER — Encounter: Payer: BC Managed Care – PPO | Admitting: Obstetrics & Gynecology

## 2021-10-01 ENCOUNTER — Ambulatory Visit (INDEPENDENT_AMBULATORY_CARE_PROVIDER_SITE_OTHER): Payer: BC Managed Care – PPO

## 2021-10-01 ENCOUNTER — Encounter: Payer: Self-pay | Admitting: Family

## 2021-10-01 DIAGNOSIS — Z1231 Encounter for screening mammogram for malignant neoplasm of breast: Secondary | ICD-10-CM

## 2021-10-04 LAB — CK: Total CK: 46 U/L (ref 29–143)

## 2021-10-04 LAB — RHEUMATOID FACTOR: Rheumatoid fact SerPl-aCnc: <14 IU/mL (ref <14)

## 2021-10-04 LAB — LUPUS ANTICOAGULANT EVAL W/ REFLEX
PTT-LA Screen: 37 s (ref <=40)
dRVVT: 38 s (ref <=45)

## 2021-10-04 LAB — SJOGRENS SYNDROME-B EXTRACTABLE NUCLEAR ANTIBODY: SSB (La) (ENA) Antibody, IgG: <1 AI

## 2021-10-04 LAB — C3 AND C4
C3 Complement: 141 mg/dL (ref 83–193)
C4 Complement: 27 mg/dL (ref 15–57)

## 2021-10-04 LAB — ANTI-DNA ANTIBODY, DOUBLE-STRANDED: ds DNA Ab: 1 IU/mL

## 2021-10-04 LAB — RNP ANTIBODY: Ribonucleic Protein(ENA) Antibody, IgG: <1 AI

## 2021-10-04 LAB — ANTI-SCLERODERMA ANTIBODY: Scleroderma (Scl-70) (ENA) Antibody, IgG: <1 AI

## 2021-10-04 LAB — CARDIOLIPIN ANTIBODIES, IGG, IGM, IGA
Anticardiolipin IgA: <2 APL-U/mL (ref <20.0)
Anticardiolipin IgG: 4.4 GPL-U/mL (ref <20.0)
Anticardiolipin IgM: <2 MPL-U/mL (ref <20.0)

## 2021-10-04 LAB — SJOGRENS SYNDROME-A EXTRACTABLE NUCLEAR ANTIBODY: SSA (Ro) (ENA) Antibody, IgG: <1 AI

## 2021-10-04 LAB — ANTI-SMITH ANTIBODY: ENA SM Ab Ser-aCnc: <1 AI

## 2021-10-04 LAB — CYCLIC CITRUL PEPTIDE ANTIBODY, IGG: Cyclic Citrullin Peptide Ab: <16 UNITS

## 2021-10-04 LAB — VITAMIN D 25 HYDROXY (VIT D DEFICIENCY, FRACTURES): Vit D, 25-Hydroxy: 41 ng/mL (ref 30–100)

## 2021-10-04 LAB — TSH: TSH: 2.55 mIU/L

## 2021-10-04 LAB — ANA: Anti Nuclear Antibody (ANA): NEGATIVE

## 2021-10-04 LAB — BETA-2 GLYCOPROTEIN ANTIBODIES
Beta-2 Glyco 1 IgA: <2 U/mL (ref <20.0)
Beta-2 Glyco 1 IgM: <2 U/mL (ref <20.0)
Beta-2 Glyco I IgG: <2 U/mL (ref <20.0)

## 2021-10-04 NOTE — Progress Notes (Signed)
All the labs are within normal limits.  I will discuss results at the follow-up visit.

## 2021-10-06 ENCOUNTER — Encounter: Payer: Self-pay | Admitting: Family

## 2021-10-07 ENCOUNTER — Ambulatory Visit (INDEPENDENT_AMBULATORY_CARE_PROVIDER_SITE_OTHER): Payer: BC Managed Care – PPO | Admitting: Gastroenterology

## 2021-10-07 ENCOUNTER — Other Ambulatory Visit: Payer: Self-pay | Admitting: Family

## 2021-10-07 ENCOUNTER — Encounter: Payer: Self-pay | Admitting: Gastroenterology

## 2021-10-07 ENCOUNTER — Other Ambulatory Visit (INDEPENDENT_AMBULATORY_CARE_PROVIDER_SITE_OTHER): Payer: BC Managed Care – PPO

## 2021-10-07 VITALS — BP 130/82 | HR 80 | Ht 69.0 in | Wt 229.0 lb

## 2021-10-07 DIAGNOSIS — R1031 Right lower quadrant pain: Secondary | ICD-10-CM

## 2021-10-07 DIAGNOSIS — M25561 Pain in right knee: Secondary | ICD-10-CM | POA: Diagnosis not present

## 2021-10-07 DIAGNOSIS — R1013 Epigastric pain: Secondary | ICD-10-CM

## 2021-10-07 DIAGNOSIS — M25562 Pain in left knee: Secondary | ICD-10-CM | POA: Diagnosis not present

## 2021-10-07 DIAGNOSIS — R197 Diarrhea, unspecified: Secondary | ICD-10-CM

## 2021-10-07 DIAGNOSIS — Z8379 Family history of other diseases of the digestive system: Secondary | ICD-10-CM

## 2021-10-07 DIAGNOSIS — R928 Other abnormal and inconclusive findings on diagnostic imaging of breast: Secondary | ICD-10-CM

## 2021-10-07 DIAGNOSIS — A09 Infectious gastroenteritis and colitis, unspecified: Secondary | ICD-10-CM | POA: Diagnosis not present

## 2021-10-07 DIAGNOSIS — M6289 Other specified disorders of muscle: Secondary | ICD-10-CM | POA: Diagnosis not present

## 2021-10-07 DIAGNOSIS — M5459 Other low back pain: Secondary | ICD-10-CM | POA: Diagnosis not present

## 2021-10-07 LAB — CBC WITH DIFFERENTIAL/PLATELET
Basophils Absolute: 0 10*3/uL (ref 0.0–0.1)
Basophils Relative: 1.2 % (ref 0.0–3.0)
Eosinophils Absolute: 0.1 10*3/uL (ref 0.0–0.7)
Eosinophils Relative: 2.5 % (ref 0.0–5.0)
HCT: 38.8 % (ref 36.0–46.0)
Hemoglobin: 12.8 g/dL (ref 12.0–15.0)
Lymphocytes Relative: 35.3 % (ref 12.0–46.0)
Lymphs Abs: 1.2 10*3/uL (ref 0.7–4.0)
MCHC: 32.9 g/dL (ref 30.0–36.0)
MCV: 86.8 fl (ref 78.0–100.0)
Monocytes Absolute: 0.3 10*3/uL (ref 0.1–1.0)
Monocytes Relative: 7.1 % (ref 3.0–12.0)
Neutro Abs: 1.9 10*3/uL (ref 1.4–7.7)
Neutrophils Relative %: 53.9 % (ref 43.0–77.0)
Platelets: 267 10*3/uL (ref 150.0–400.0)
RBC: 4.47 Mil/uL (ref 3.87–5.11)
RDW: 13.9 % (ref 11.5–15.5)
WBC: 3.5 10*3/uL — ABNORMAL LOW (ref 4.0–10.5)

## 2021-10-07 LAB — COMPREHENSIVE METABOLIC PANEL
ALT: 29 U/L (ref 0–35)
AST: 23 U/L (ref 0–37)
Albumin: 4.4 g/dL (ref 3.5–5.2)
Alkaline Phosphatase: 70 U/L (ref 39–117)
BUN: 11 mg/dL (ref 6–23)
CO2: 30 mEq/L (ref 19–32)
Calcium: 9.6 mg/dL (ref 8.4–10.5)
Chloride: 104 mEq/L (ref 96–112)
Creatinine, Ser: 0.85 mg/dL (ref 0.40–1.20)
GFR: 85.06 mL/min (ref >60.00)
Glucose, Bld: 99 mg/dL (ref 70–99)
Potassium: 4.3 mEq/L (ref 3.5–5.1)
Sodium: 138 mEq/L (ref 135–145)
Total Bilirubin: 0.3 mg/dL (ref 0.2–1.2)
Total Protein: 6.9 g/dL (ref 6.0–8.3)

## 2021-10-07 LAB — C-REACTIVE PROTEIN: CRP: <1 mg/dL (ref 0.5–20.0)

## 2021-10-07 MED ORDER — PANTOPRAZOLE SODIUM 40 MG PO TBEC: 40.0000 mg | DELAYED_RELEASE_TABLET | Freq: Every day | ORAL | 3 refills | Status: DC

## 2021-10-07 NOTE — Progress Notes (Signed)
Chief Complaint: Bloody diarrhea  Referring Provider:  Debbrah Alar, NP      ASSESSMENT AND PLAN;   #1. Bloody Diarrhea #2. Epi pain with H/O oral ulcers. Dx with nonspecific autoimmune disorder by Dr. Estanislado Pandy. R/O UGI Crohn's #3. RLQ abdominal pain with neg CT 05/2021, 01/11/2015, neg [place] 2019 #4. Strong FH of crohn's (brother, mom) and celiac disease (sister). Neg celiac serology  PLAN:  -Stool studies for GI Pathogen, fecal elastase, fat and Calprotectin -EGD/colon with miralax. 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 Nl CBC, CMP CT AP with contrast 06/05/2021 showing significant stool throughout the colon. CT also showed 3 cm left adnexal cyst. Note that she had lap hysterectomy and right salpingo-oophorectomy in the past.  Pt is s/p lap resection for left adnexal cyst. Final path- neg for endometriosis or malignancy.   Sent for further GI eval  Has been having diarrhea 2-4 Bms/day with mucus/blood at times mixed with the stool.  No urgency.  Had few nocturnal symptoms as well.  Chronic RLQ abdo pain which does not always gets relieved on defecation.  No recent antibiotics.  No recent travel.  Has has nausea. Assoc aphthous mouth ulcers x 1 year.  Occ heartburn despite as needed omeprazole.  No odynophagia or dysphagia.    Does admit that she has been using significant ibuprofen 800 mg 3 times daily x more than a year for arthritis/arthralgias.  She is being followed by Dr. Estanislado Pandy.  Extensive autoimmune work-up has been negative so far.  No weight loss.  No fever or chills.  Wt Readings from Last 3 Encounters:  10/07/21 229 lb (103.9 kg)  09/29/21 231 lb 6.4 oz (105 kg)  09/08/21 226 lb (102.5 kg)     From previous notes:. RLQ abdominal pain x 5 yrs, got worse over the last few  weeks-intermittent, negative CT 2016. Worst with pasta, dairy,  Sis with celiac, brother had crohn's. Has been given a trial of cholestyramine with some relief Pt with neg celiac screen.   Past GI work-up:  CT AP with contrast 06/05/2021 1. High-grade stool is seen throughout all segments of the colon compatible with constipation. 2. Normal appendix. 3. There is a likely cyst measuring up to 3.1 cm within the left adnexa with intermediate density mild complexity. No ruptured adnexal cyst is seen.  Colon 08/2017 - Small internal hemorrhoids. - Otherwise normal colonoscopy to terminal ileum. Neg TI and random colon Bx.   Past Medical History:  Diagnosis Date   Adnexal mass 02/24/2021   in epic seen on [place]   Amenorrhea 08/14/2021   Anxiety 08/14/2021   Chronic constipation 08/14/2021   Chronic headaches HISTORY OF MIGRAINES 42/40/9811   Complication of anesthesia 08/14/2021   PONV   Depression    Endometriosis    GERD (gastroesophageal reflux disease)    tums otc, prevacid prn   history of Chiari malformation    history of COVID 02/2021   LOW GRADE FEVER CONGESTION AND HEADACHE ALL SYMPTOMS RESOLVED   HISTORY OF MIGRAINES 08/14/2021   Hyperlipidemia    Insomnia 08/19/2020   Low blood pressure    pt states history of low blood pressure-fainted 12/2010-instructed to move slowly   OSTEOARTHRITIS 08/14/2021   SLE (systemic lupus erythematosus) ([place]) 03/26/2016   Wears glasses for  reading 08/14/2021    Past Surgical History:  Procedure Laterality Date   ABDOMINAL HYSTERECTOMY  2007   CYSTOSCOPY  2007   WITH HYSTERECTOMY   DIAGNOSTIC LAPAROSCOPY  2005   OOPHORECTOMY Left 08/18/2021   ROTATOR CUFF REPAIR Right 2010   SALPINGOOPHORECTOMY  04/16/2011   Procedure: SALPINGO OOPHERECTOMY;  Surgeon: Lovenia Kim, MD;  Location: [facility];  Service: Gynecology;  Laterality: Right;   skull decompression surgery 2005     @ [place] History  Problem Relation Age  of Onset   Hypertension Mother    CAD Mother    Breast cancer Mother    Migraines Mother    Gout Mother    Heart disease Mother    Crohn's disease Mother    Hypertension Father    CAD Father    CVA Father    Heart disease Father    Colon polyps Father    Liver disease Father    Sjogren's syndrome Sister    Celiac disease Sister    Rheum arthritis Maternal Grandmother    Dementia Maternal Grandmother    Pancreatic cancer Maternal Grandfather    CAD Paternal Grandmother    Macular degeneration Paternal Grandmother    CVA Paternal Grandfather    Autism Son    ADD / ADHD Son    Breast cancer Maternal Aunt    Esophageal cancer Neg Hx    Colon cancer Neg Hx    Stomach cancer Neg Hx     Social History   Tobacco Use   Smoking status: Former    Packs/day: 1.00    Years: 10.00    Total pack years: 10.00    Types: Cigarettes    Quit date: 04/04/2004    Years since quitting: 17.5    Passive exposure: Past   Smokeless tobacco: Never  Vaping Use   Vaping Use: Never used  Substance Use Topics   Alcohol use: Not Currently   Drug use: Never    Current Outpatient Medications  Medication Sig Dispense Refill   cyclobenzaprine (FLEXERIL) 5 MG tablet 1-2 tabs by mouth at bedtime as needed for back pain. 30 tablet 0   fluticasone (FLONASE) 50 MCG/ACT nasal spray Place 2 sprays into both nostrils daily as needed for allergies or rhinitis. 16 g 1   gabapentin (NEURONTIN) 300 MG capsule Take 1 capsule (300 mg total) by mouth at bedtime. 90 capsule 1   omeprazole (PRILOSEC) 40 MG capsule Take 1 capsule (40 mg total) by mouth daily. 30 capsule 3   PARoxetine (PAXIL) 20 MG tablet Take 1 tablet (20 mg total) by mouth at bedtime. 90 tablet 3   SUMAtriptan (IMITREX) 100 MG tablet Take 1 tablet (100 mg total) by mouth at onset of migraine. May repeat in 2 hours if headache persists or recurs. Do not take more than 2 doses in 24 hours. 10 tablet 2   traZODone (DESYREL) 100 MG tablet TAKE TWO  TABLETS BY MOUTH EVERY NIGHT AT BEDTIME 180 tablet 1   No current facility-administered medications for this visit.    Allergies  Allergen Reactions   Effexor [Venlafaxine] Nausea Only    insomnia   Tramadol Nausea And Vomiting   Norco [Hydrocodone-Acetaminophen] Rash   Voltaren [Diclofenac] Rash    Review of Systems:  Constitutional: Denies fever, chills, diaphoresis, appetite change and fatigue.  HEENT: Denies photophobia, eye pain, redness, hearing loss, ear pain, congestion, sore throat, rhinorrhea, sneezing, mouth sores, neck pain, neck stiffness and  tinnitus.   Respiratory: Denies SOB, DOE, cough, chest tightness,  and wheezing.   Cardiovascular: Denies chest pain, palpitations and leg swelling.  Genitourinary: Denies dysuria, urgency, frequency, hematuria, flank pain and difficulty urinating.  Musculoskeletal: has myalgias, joint swelling, arthralgias.  Neurological: Denies dizziness, seizures, syncope, weakness, light-headedness, numbness and headaches.  Hematological: Denies adenopathy. Easy bruising, personal or family bleeding history  Psychiatric/Behavioral: No anxiety or depression     Physical Exam:    BP 130/82   Pulse 80   Ht '5\' 9"'$  (1.753 m)   Wt 229 lb (103.9 kg)   SpO2 97%   BMI 33.82 kg/m  Filed Weights   10/07/21 0842  Weight: 229 lb (103.9 kg)   Constitutional:  Well-developed, in no acute distress. Psychiatric: Normal mood and affect. Behavior is normal. HEENT: Pupils normal.  Conjunctivae are normal. No scleral icterus. Small aphthous erosion right cheek. Neck supple.  Cardiovascular: Normal rate, regular rhythm. No edema Pulmonary/chest: Effort normal and breath sounds normal. No wheezing, rales or rhonchi. Abdominal: Soft, nondistended. Epi tender. Bowel sounds active throughout. There are no masses palpable. No hepatomegaly. Rectal:  defered Neurological: Alert and oriented to person place and time. Skin: Skin is warm and dry. No rashes  noted.  Data Reviewed: I have personally reviewed following labs and imaging studies  CBC:    Latest Ref Rng & Units 09/08/2021   10:45 AM 06/05/2021    4:37 PM 04/21/2021   11:51 AM  CBC  WBC 4.0 - 10.5 K/uL 4.8  7.1  5.5   Hemoglobin 12.0 - 15.0 g/dL 12.8  13.3  13.2   Hematocrit 36.0 - 46.0 % 39.3  40.4  40.3   Platelets 150.0 - 400.0 K/uL 325.0  324  308     CMP:    Latest Ref Rng & Units 09/08/2021   10:45 AM 06/05/2021    4:27 PM 04/21/2021   11:51 AM  CMP  Glucose 70 - 99 mg/dL 88  105  106   BUN 6 - 23 mg/dL '16  19  16   '$ Creatinine 0.40 - 1.20 mg/dL 0.93  0.70  0.75   Sodium 135 - 145 mEq/L 141  135  137   Potassium 3.5 - 5.1 mEq/L 4.4  4.1  4.2   Chloride 96 - 112 mEq/L 102  100  104   CO2 19 - 32 mEq/L 32  26  23   Calcium 8.4 - 10.5 mg/dL 9.7  9.3  9.5   Total Protein 6.0 - 8.3 g/dL 7.0  7.5    Total Bilirubin 0.2 - 1.2 mg/dL 0.5  0.2    Alkaline Phos 39 - 117 U/L 83  68    AST 0 - 37 U/L 13  22    ALT 0 - 35 U/L 22  31      GFR:   Radiology Studies: MM 3D SCREEN BREAST BILATERAL  Result Date: 10/05/2021 CLINICAL DATA:  Screening. EXAM: DIGITAL SCREENING BILATERAL MAMMOGRAM WITH TOMOSYNTHESIS AND CAD TECHNIQUE: Bilateral screening digital craniocaudal and mediolateral oblique mammograms were obtained. Bilateral screening digital breast tomosynthesis was performed. The images were evaluated with computer-aided detection. COMPARISON:  Previous exam(s). ACR Breast Density Category b: There are scattered areas of fibroglandular density. FINDINGS: In the left breast, a possible asymmetry warrants further evaluation. In the right breast, no findings suspicious for malignancy. IMPRESSION: Further evaluation is suggested for possible asymmetry in the left breast. RECOMMENDATION: Diagnostic mammogram and possibly ultrasound of the left breast. (Code:FI-L-15M) The  patient will be contacted regarding the findings, and additional imaging will be scheduled. BI-RADS CATEGORY  0:  Incomplete. Need additional imaging evaluation and/or prior mammograms for comparison. Electronically Signed   By: Lillia Mountain M.D.   On: 10/05/2021 09:32       Carmell Austria, MD   Cc: Debbrah Alar, NP

## 2021-10-07 NOTE — Patient Instructions (Addendum)
If you are age 42 or older, your body mass index should be between 23-30. Your Body mass index is 33.82 kg/m. If this is out of the aforementioned range listed, please consider follow up with your Primary Care Provider.  If you are age 13 or younger, your body mass index should be between 19-25. Your Body mass index is 33.82 kg/m. If this is out of the aformentioned range listed, please consider follow up with your Primary Care Provider.   ________________________________________________________  The  GI providers would like to encourage you to use [facility] to communicate with providers for non-urgent requests or questions.  Due to long hold times on the telephone, sending your provider a message by [facility] may be a faster and more efficient way to get a response.  Please allow 48 business hours for a response.  Please remember that this is for non-urgent requests.  _______________________________________________________  Your provider has requested that you go to the basement level for lab work before leaving today. Press "B" on the elevator. The lab is located at the first door on the left as you exit the elevator.  We have sent the following medications to your pharmacy for you to pick up at your convenience: Protonix  You have been scheduled for an endoscopy and colonoscopy. Please follow the written instructions given to you at your visit today. Please pick up your prep supplies at the pharmacy within the next 1-3 days. If you use inhalers (even only as needed), please bring them with you on the day of your procedure.  Minimize NSAID's  Thank you,  Dr. Jackquline Denmark

## 2021-10-08 LAB — GI PROFILE, STOOL, PCR

## 2021-10-09 ENCOUNTER — Ambulatory Visit
Admission: RE | Admit: 2021-10-09 | Discharge: 2021-10-09 | Disposition: A | Discharge status: Home / Self Care | Payer: BC Managed Care – PPO | Source: Ambulatory Visit | Attending: Family | Admitting: Family

## 2021-10-09 ENCOUNTER — Other Ambulatory Visit: Payer: Self-pay | Admitting: Family

## 2021-10-09 DIAGNOSIS — R928 Other abnormal and inconclusive findings on diagnostic imaging of breast: Secondary | ICD-10-CM

## 2021-10-09 DIAGNOSIS — N6489 Other specified disorders of breast: Secondary | ICD-10-CM | POA: Diagnosis not present

## 2021-10-12 DIAGNOSIS — M25562 Pain in left knee: Secondary | ICD-10-CM | POA: Diagnosis not present

## 2021-10-12 DIAGNOSIS — M25561 Pain in right knee: Secondary | ICD-10-CM | POA: Diagnosis not present

## 2021-10-12 DIAGNOSIS — M6289 Other specified disorders of muscle: Secondary | ICD-10-CM | POA: Diagnosis not present

## 2021-10-12 DIAGNOSIS — M5459 Other low back pain: Secondary | ICD-10-CM | POA: Diagnosis not present

## 2021-10-14 ENCOUNTER — Encounter: Payer: Self-pay | Admitting: Rheumatology

## 2021-10-14 ENCOUNTER — Other Ambulatory Visit: Payer: Self-pay | Admitting: *Deleted

## 2021-10-14 DIAGNOSIS — M5459 Other low back pain: Secondary | ICD-10-CM | POA: Diagnosis not present

## 2021-10-14 DIAGNOSIS — M6289 Other specified disorders of muscle: Secondary | ICD-10-CM | POA: Diagnosis not present

## 2021-10-14 DIAGNOSIS — M25561 Pain in right knee: Secondary | ICD-10-CM | POA: Diagnosis not present

## 2021-10-14 DIAGNOSIS — R899 Unspecified abnormal finding in specimens from other organs, systems and tissues: Secondary | ICD-10-CM

## 2021-10-14 DIAGNOSIS — R52 Pain, unspecified: Secondary | ICD-10-CM

## 2021-10-14 DIAGNOSIS — M25562 Pain in left knee: Secondary | ICD-10-CM | POA: Diagnosis not present

## 2021-10-14 LAB — CALPROTECTIN, FECAL: Calprotectin, Fecal: 36 ug/g (ref 0–120)

## 2021-10-14 LAB — PANCREATIC ELASTASE, FECAL: Pancreatic Elastase-1, Stool: 431 mcg/g

## 2021-10-14 NOTE — Telephone Encounter (Signed)
I returned patient's call but did not get a response.  Please call patient and discussed the following: Her autoimmune work-up is negative and she does not have an autoimmune disease at this time.   She may see hematology to discuss low white cell count and abnormal D-dimers. I do not prescribe pain medications.  She should get a referral to pain management.

## 2021-10-14 NOTE — Telephone Encounter (Signed)
I called patient, referral for hematology and pain management ordered.

## 2021-10-15 ENCOUNTER — Other Ambulatory Visit: Payer: Self-pay | Admitting: Family

## 2021-10-16 LAB — FECAL FAT, QUALITATIVE
Fat Qual Neutral, Stl: NORMAL
Fat Qual Total, Stl: NORMAL

## 2021-10-21 DIAGNOSIS — M5459 Other low back pain: Secondary | ICD-10-CM | POA: Diagnosis not present

## 2021-10-21 DIAGNOSIS — M6289 Other specified disorders of muscle: Secondary | ICD-10-CM | POA: Diagnosis not present

## 2021-10-21 DIAGNOSIS — M25561 Pain in right knee: Secondary | ICD-10-CM | POA: Diagnosis not present

## 2021-10-21 DIAGNOSIS — M25562 Pain in left knee: Secondary | ICD-10-CM | POA: Diagnosis not present

## 2021-10-22 ENCOUNTER — Encounter: Payer: Self-pay | Admitting: Rheumatology

## 2021-10-23 DIAGNOSIS — M25561 Pain in right knee: Secondary | ICD-10-CM | POA: Diagnosis not present

## 2021-10-23 DIAGNOSIS — M25562 Pain in left knee: Secondary | ICD-10-CM | POA: Diagnosis not present

## 2021-10-23 DIAGNOSIS — M5459 Other low back pain: Secondary | ICD-10-CM | POA: Diagnosis not present

## 2021-10-23 DIAGNOSIS — M6289 Other specified disorders of muscle: Secondary | ICD-10-CM | POA: Diagnosis not present

## 2021-10-26 DIAGNOSIS — M5459 Other low back pain: Secondary | ICD-10-CM | POA: Diagnosis not present

## 2021-10-26 DIAGNOSIS — M6289 Other specified disorders of muscle: Secondary | ICD-10-CM | POA: Diagnosis not present

## 2021-10-26 DIAGNOSIS — M25562 Pain in left knee: Secondary | ICD-10-CM | POA: Diagnosis not present

## 2021-10-26 DIAGNOSIS — M25561 Pain in right knee: Secondary | ICD-10-CM | POA: Diagnosis not present

## 2021-11-03 DIAGNOSIS — M25562 Pain in left knee: Secondary | ICD-10-CM | POA: Diagnosis not present

## 2021-11-03 DIAGNOSIS — M25561 Pain in right knee: Secondary | ICD-10-CM | POA: Diagnosis not present

## 2021-11-03 DIAGNOSIS — M6289 Other specified disorders of muscle: Secondary | ICD-10-CM | POA: Diagnosis not present

## 2021-11-03 DIAGNOSIS — M5459 Other low back pain: Secondary | ICD-10-CM | POA: Diagnosis not present

## 2021-11-06 DIAGNOSIS — M5459 Other low back pain: Secondary | ICD-10-CM | POA: Diagnosis not present

## 2021-11-06 DIAGNOSIS — M6289 Other specified disorders of muscle: Secondary | ICD-10-CM | POA: Diagnosis not present

## 2021-11-06 DIAGNOSIS — M25562 Pain in left knee: Secondary | ICD-10-CM | POA: Diagnosis not present

## 2021-11-06 DIAGNOSIS — M25561 Pain in right knee: Secondary | ICD-10-CM | POA: Diagnosis not present

## 2021-11-16 DIAGNOSIS — M25561 Pain in right knee: Secondary | ICD-10-CM | POA: Diagnosis not present

## 2021-11-16 DIAGNOSIS — M5459 Other low back pain: Secondary | ICD-10-CM | POA: Diagnosis not present

## 2021-11-16 DIAGNOSIS — M25562 Pain in left knee: Secondary | ICD-10-CM | POA: Diagnosis not present

## 2021-11-16 DIAGNOSIS — M6289 Other specified disorders of muscle: Secondary | ICD-10-CM | POA: Diagnosis not present

## 2021-11-18 ENCOUNTER — Encounter: Payer: Self-pay | Admitting: Gastroenterology

## 2021-11-18 ENCOUNTER — Encounter: Payer: Self-pay | Admitting: Certified Registered Nurse Anesthetist

## 2021-11-20 ENCOUNTER — Encounter: Payer: Self-pay | Admitting: Family

## 2021-11-25 ENCOUNTER — Encounter: Payer: Self-pay | Admitting: Gastroenterology

## 2021-11-25 ENCOUNTER — Ambulatory Visit (AMBULATORY_SURGERY_CENTER): Payer: BC Managed Care – PPO | Admitting: Gastroenterology

## 2021-11-25 VITALS — BP 106/72 | HR 59 | Temp 98.4°F | Resp 14 | Ht 69.0 in | Wt 229.0 lb

## 2021-11-25 DIAGNOSIS — K2960 Other gastritis without bleeding: Secondary | ICD-10-CM

## 2021-11-25 DIAGNOSIS — K2970 Gastritis, unspecified, without bleeding: Secondary | ICD-10-CM | POA: Diagnosis not present

## 2021-11-25 DIAGNOSIS — R197 Diarrhea, unspecified: Secondary | ICD-10-CM

## 2021-11-25 DIAGNOSIS — K640 First degree hemorrhoids: Secondary | ICD-10-CM

## 2021-11-25 DIAGNOSIS — K529 Noninfective gastroenteritis and colitis, unspecified: Secondary | ICD-10-CM | POA: Diagnosis not present

## 2021-11-25 DIAGNOSIS — K625 Hemorrhage of anus and rectum: Secondary | ICD-10-CM | POA: Diagnosis not present

## 2021-11-25 DIAGNOSIS — R109 Unspecified abdominal pain: Secondary | ICD-10-CM

## 2021-11-25 MED ORDER — SODIUM CHLORIDE 0.9 % IV SOLN: 500.0000 mL | INTRAVENOUS | Status: DC

## 2021-11-25 NOTE — Progress Notes (Signed)
Report given to PACU, vss 

## 2021-11-25 NOTE — Progress Notes (Signed)
1108 Robinul 0.1 mg IV given due large amount of secretions upon assessment.  MD made aware, vss

## 2021-11-25 NOTE — Op Note (Signed)
[place] Patient Name: Kim Pham Procedure Date: 11/25/2021 11:06 AM MRN: [id number] Endoscopist: Jackquline Denmark , MD Age: 41 Referring MD:  Date of Birth: 07/29/1980 Gender: Female Account #: [id number] Procedure:                Upper GI endoscopy Indications:              Epigastric abdominal pain Medicines:                Monitored Anesthesia Care Procedure:                Pre-Anesthesia Assessment:                           - Prior to the procedure, a History and Physical                            was performed, and patient medications and                            allergies were reviewed. The patient's tolerance of                            previous anesthesia was also reviewed. The risks                            and benefits of the procedure and the sedation                            options and risks were discussed with the patient.                            All questions were answered, and informed consent                            was obtained. Prior Anticoagulants: The patient has                            taken no previous anticoagulant or antiplatelet                            agents. ASA Grade Assessment: II - A patient with                            mild systemic disease. After reviewing the risks                            and benefits, the patient was deemed in                            satisfactory condition to undergo the procedure.                           After obtaining informed consent, the endoscope was  passed under direct vision. Throughout the                            procedure, the patient's blood pressure, pulse, and                            oxygen saturations were monitored continuously. The                            GIF [id number] #[id number] was introduced through the                            mouth, and advanced to the second part of duodenum.                            The upper GI endoscopy was  accomplished without                            difficulty. The patient tolerated the procedure                            well. Scope In: Scope Out: Findings:                 The examined esophagus was normal with well-defined                            Z-line at 38 cm, examined by NBI.                           The entire examined stomach was normal. Biopsies                            were taken with a cold forceps for histology.                           The examined duodenum was normal. Biopsies for                            histology were taken with a cold forceps for                            evaluation of celiac disease. Complications:            No immediate complications. Estimated Blood Loss:     Estimated blood loss: none. Impression:               - Normal EGD Recommendation:           - Patient has a contact number available for                            emergencies. The signs and symptoms of potential                            delayed complications were discussed with the  patient. Return to normal activities tomorrow.                            Written discharge instructions were provided to the                            patient.                           - Resume previous diet.                           - Continue present medications.                           - Await pathology results.                           - The findings and recommendations were discussed                            with the patient's family. Jackquline Denmark, MD 11/25/2021 11:45:42 AM This report has been signed electronically.

## 2021-11-25 NOTE — Op Note (Signed)
[place] Patient Name: Kim Pham Procedure Date: 11/25/2021 11:06 AM MRN: [id number] Endoscopist: Jackquline Denmark , MD Age: 41 Referring MD:  Date of Birth: 06/15/1980 Gender: Female Account #: [id number] Procedure:                Colonoscopy Indications:              Clinically significant diarrhea of unexplained                            origin. Rectal bleeding. FH of IBD. Medicines:                Monitored Anesthesia Care Procedure:                Pre-Anesthesia Assessment:                           - Prior to the procedure, a History and Physical                            was performed, and patient medications and                            allergies were reviewed. The patient's tolerance of                            previous anesthesia was also reviewed. The risks                            and benefits of the procedure and the sedation                            options and risks were discussed with the patient.                            All questions were answered, and informed consent                            was obtained. Prior Anticoagulants: The patient has                            taken no previous anticoagulant or antiplatelet                            agents. ASA Grade Assessment: II - A patient with                            mild systemic disease. After reviewing the risks                            and benefits, the patient was deemed in                            satisfactory condition to undergo the procedure.  After obtaining informed consent, the colonoscope                            was passed under direct vision. Throughout the                            procedure, the patient's blood pressure, pulse, and                            oxygen saturations were monitored continuously. The                            Olympus [id number]-[id number] (#[id number]) Colonoscope was                            introduced through the anus and  advanced to the 4                            cm into the ileum. The colonoscopy was performed                            without difficulty. The patient tolerated the                            procedure well. The quality of the bowel                            preparation was good. The terminal ileum, ileocecal                            valve, appendiceal orifice, and rectum were                            photographed. Scope In: 11:23:45 AM Scope Out: 11:40:49 AM Scope Withdrawal Time: 0 hours 13 minutes 24 seconds  Total Procedure Duration: 0 hours 17 minutes 4 seconds  Findings:                 The colon (entire examined portion) appeared                            normal. Biopsies for histology were taken with a                            cold forceps from the entire colon for evaluation                            of microscopic colitis.                           Non-bleeding internal hemorrhoids were found during                            retroflexion. The hemorrhoids were small and Grade  I (internal hemorrhoids that do not prolapse).                           The terminal ileum appeared normal.                           The exam was otherwise without abnormality on                            direct and retroflexion views. Complications:            No immediate complications. Estimated Blood Loss:     Estimated blood loss: none. Impression:               - Non-bleeding internal hemorrhoids.                           - Otherwise normal colonoscopy to TI. Recommendation:           - Patient has a contact number available for                            emergencies. The signs and symptoms of potential                            delayed complications were discussed with the                            patient. Return to normal activities tomorrow.                            Written discharge instructions were provided to the                             patient.                           - Continue present medications.                           - Await pathology results.                           - Repeat colonoscopy in 10 years for screening                            purposes. Earlier, if with any new problems or                            change in family history.                           - The findings and recommendations were discussed                            with the patient's family. Jackquline Denmark, MD 11/25/2021 11:48:59 AM This report has been signed electronically.

## 2021-11-25 NOTE — Progress Notes (Signed)
Called to room to assist during endoscopic procedure.  Patient ID and intended procedure confirmed with present staff. Received instructions for my participation in the procedure from the performing physician.Pt's states no medical or surgical changes since previsit or office visit. 

## 2021-11-25 NOTE — Patient Instructions (Signed)
YOU HAD AN ENDOSCOPIC PROCEDURE TODAY AT THE [facility]:   Refer to the procedure report that was given to you for any specific questions about what was found during the examination.  If the procedure report does not answer your questions, please call your gastroenterologist to clarify.  If you requested that your care partner not be given the details of your procedure findings, then the procedure report has been included in a sealed envelope for you to review at your convenience later.  YOU SHOULD EXPECT: Some feelings of bloating in the abdomen. Passage of more gas than usual.  Walking can help get rid of the air that was put into your GI tract during the procedure and reduce the bloating. If you had a lower endoscopy (such as a colonoscopy or flexible sigmoidoscopy) you may notice spotting of blood in your stool or on the toilet paper. If you underwent a bowel prep for your procedure, you may not have a normal bowel movement for a few days.  Please Note:  You might notice some irritation and congestion in your nose or some drainage.  This is from the oxygen used during your procedure.  There is no need for concern and it should clear up in a day or so.  SYMPTOMS TO REPORT IMMEDIATELY:  Following lower endoscopy (colonoscopy or flexible sigmoidoscopy):  Excessive amounts of blood in the stool  Significant tenderness or worsening of abdominal pains  Swelling of the abdomen that is new, acute  Fever of 100F or higher  Following upper endoscopy (EGD)  Vomiting of blood or coffee ground material  New chest pain or pain under the shoulder blades  Painful or persistently difficult swallowing  New shortness of breath  Fever of 100F or higher  Black, tarry-looking stools  For urgent or emergent issues, a gastroenterologist can be reached at any hour by calling [phone]. Do not use MyChart messaging for urgent concerns.    DIET:  We do recommend a small meal at first, but  then you may proceed to your regular diet.  Drink plenty of fluids but you should avoid alcoholic beverages for 24 hours.  ACTIVITY:  You should plan to take it easy for the rest of today and you should NOT DRIVE or use heavy machinery until tomorrow (because of the sedation medicines used during the test).    FOLLOW UP: Our staff will call the number listed on your records the next business day following your procedure.  We will call around 7:15- 8:00 am to check on you and address any questions or concerns that you may have regarding the information given to you following your procedure. If we do not reach you, we will leave a message.  If you develop any symptoms (ie: fever, flu-like symptoms, shortness of breath, cough etc.) before then, please call [phone].  If you test positive for Covid 19 in the 2 weeks post procedure, please call and report this information to us.    If any biopsies were taken you will be contacted by phone or by letter within the next 1-3 weeks.  Please call us at [phone] if you have not heard about the biopsies in 3 weeks.    SIGNATURES/CONFIDENTIALITY: You and/or your care partner have signed paperwork which will be entered into your electronic medical record.  These signatures attest to the fact that that the information above on your After Visit Summary has been reviewed and is understood.  Full responsibility of the confidentiality   of this discharge information lies with you and/or your care-partner.  

## 2021-11-25 NOTE — Progress Notes (Signed)
Chief Complaint: Bloody diarrhea  Referring Provider:  Debbrah Alar, NP      ASSESSMENT AND PLAN;   #1. Bloody Diarrhea #2. Epi pain with H/O oral ulcers. Dx with nonspecific autoimmune disorder by Dr. Estanislado Pandy. R/O UGI Crohn's #3. RLQ abdominal pain with neg CT 05/2021, 01/11/2015, neg [place] 2019 #4. Strong FH of crohn's (brother, mom) and celiac disease (sister). Neg celiac serology  PLAN:  -Stool studies for GI Pathogen, fecal elastase, fat and Calprotectin -EGD/colon with miralax. 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 Nl CBC, CMP CT AP with contrast 06/05/2021 showing significant stool throughout the colon. CT also showed 3 cm left adnexal cyst. Note that she had lap hysterectomy and right salpingo-oophorectomy in the past.  Pt is s/p lap resection for left adnexal cyst. Final path- neg for endometriosis or malignancy.   Sent for further GI eval  Has been having diarrhea 2-4 Bms/day with mucus/blood at times mixed with the stool.  No urgency.  Had few nocturnal symptoms as well.  Chronic RLQ abdo pain which does not always gets relieved on defecation.  No recent antibiotics.  No recent travel.  Has has nausea. Assoc aphthous mouth ulcers x 1 year.  Occ heartburn despite as needed omeprazole.  No odynophagia or dysphagia.    Does admit that she has been using significant ibuprofen 800 mg 3 times daily x more than a year for arthritis/arthralgias.  She is being followed by Dr. Estanislado Pandy.  Extensive autoimmune work-up has been negative so far.  No weight loss.  No fever or chills.  Wt Readings from Last 3 Encounters:  11/25/21 229 lb (103.9 kg)  10/07/21 229 lb (103.9 kg)  09/29/21 231 lb 6.4 oz (105 kg)     From previous notes:. RLQ abdominal pain x 5 yrs, got worse over the last few  weeks-intermittent, negative CT 2016. Worst with pasta, dairy,  Sis with celiac, brother had crohn's. Has been given a trial of cholestyramine with some relief Pt with neg celiac screen.   Past GI work-up:  CT AP with contrast 06/05/2021 1. High-grade stool is seen throughout all segments of the colon compatible with constipation. 2. Normal appendix. 3. There is a likely cyst measuring up to 3.1 cm within the left adnexa with intermediate density mild complexity. No ruptured adnexal cyst is seen.  Colon 08/2017 - Small internal hemorrhoids. - Otherwise normal colonoscopy to terminal ileum. Neg TI and random colon Bx.   Past Medical History:  Diagnosis Date   Adnexal mass 02/24/2021   in epic seen on [place]   Amenorrhea 08/14/2021   Anxiety 08/14/2021   Chronic constipation 08/14/2021   Chronic headaches HISTORY OF MIGRAINES 78/46/9629   Complication of anesthesia 08/14/2021   PONV   Depression    Endometriosis    GERD (gastroesophageal reflux disease)    tums otc, prevacid prn   history of Chiari malformation    history of COVID 02/2021   LOW GRADE FEVER CONGESTION AND HEADACHE ALL SYMPTOMS RESOLVED   HISTORY OF MIGRAINES 08/14/2021   Hyperlipidemia    Insomnia 08/19/2020   Low blood pressure    pt states history of low blood pressure-fainted 12/2010-instructed to move slowly   OSTEOARTHRITIS 08/14/2021   SLE (systemic lupus erythematosus) ([place]) 03/26/2016   Wears glasses for  reading 08/14/2021    Past Surgical History:  Procedure Laterality Date   ABDOMINAL HYSTERECTOMY  2007   CYSTOSCOPY  2007   WITH HYSTERECTOMY   DIAGNOSTIC LAPAROSCOPY  2005   OOPHORECTOMY Left 08/18/2021   ROTATOR CUFF REPAIR Right 2010   SALPINGOOPHORECTOMY  04/16/2011   Procedure: SALPINGO OOPHERECTOMY;  Surgeon: Lovenia Kim, MD;  Location: [facility];  Service: Gynecology;  Laterality: Right;   skull decompression surgery 2005     @ [place] History  Problem Relation Age  of Onset   Hypertension Mother    CAD Mother    Breast cancer Mother    Migraines Mother    Gout Mother    Heart disease Mother    Crohn's disease Mother    Hypertension Father    CAD Father    CVA Father    Heart disease Father    Colon polyps Father    Liver disease Father    Sjogren's syndrome Sister    Celiac disease Sister    Rheum arthritis Maternal Grandmother    Dementia Maternal Grandmother    Pancreatic cancer Maternal Grandfather    CAD Paternal Grandmother    Macular degeneration Paternal Grandmother    CVA Paternal Grandfather    Autism Son    ADD / ADHD Son    Breast cancer Maternal Aunt    Esophageal cancer Neg Hx    Colon cancer Neg Hx    Stomach cancer Neg Hx     Social History   Tobacco Use   Smoking status: Former    Packs/day: 1.00    Years: 10.00    Total pack years: 10.00    Types: Cigarettes    Quit date: 04/04/2004    Years since quitting: 17.6    Passive exposure: Past   Smokeless tobacco: Never  Vaping Use   Vaping Use: Never used  Substance Use Topics   Alcohol use: Not Currently   Drug use: Never    Current Outpatient Medications  Medication Sig Dispense Refill   gabapentin (NEURONTIN) 300 MG capsule Take 1 capsule (300 mg total) by mouth at bedtime. 90 capsule 1   pantoprazole (PROTONIX) 40 MG tablet Take 1 tablet (40 mg total) by mouth daily. 90 tablet 3   PARoxetine (PAXIL) 20 MG tablet Take 1 tablet (20 mg total) by mouth at bedtime. 90 tablet 3   traZODone (DESYREL) 100 MG tablet TAKE TWO TABLETS BY MOUTH EVERY NIGHT AT BEDTIME 180 tablet 1   cyclobenzaprine (FLEXERIL) 5 MG tablet 1-2 tabs by mouth at bedtime as needed for back pain. 30 tablet 0   fluticasone (FLONASE) 50 MCG/ACT nasal spray Place 2 sprays into both nostrils daily as needed for allergies or rhinitis. 16 g 1   SUMAtriptan (IMITREX) 100 MG tablet Take 1 tablet (100 mg total) by mouth at onset of migraine. May repeat in 2 hours if headache persists or recurs. Do  not take more than 2 doses in 24 hours. 10 tablet 2   Current Facility-Administered Medications  Medication Dose Route Frequency Provider Last Rate Last Admin   0.9 %  sodium chloride infusion  500 mL Intravenous Continuous Jackquline Denmark, MD        Allergies  Allergen Reactions   Effexor [Venlafaxine] Nausea Only    insomnia   Tramadol Nausea And Vomiting   Norco [Hydrocodone-Acetaminophen] Rash   Voltaren [Diclofenac] Rash    Review of Systems:  Constitutional: Denies fever, chills, diaphoresis, appetite  change and fatigue.  HEENT: Denies photophobia, eye pain, redness, hearing loss, ear pain, congestion, sore throat, rhinorrhea, sneezing, mouth sores, neck pain, neck stiffness and tinnitus.   Respiratory: Denies SOB, DOE, cough, chest tightness,  and wheezing.   Cardiovascular: Denies chest pain, palpitations and leg swelling.  Genitourinary: Denies dysuria, urgency, frequency, hematuria, flank pain and difficulty urinating.  Musculoskeletal: has myalgias, joint swelling, arthralgias.  Neurological: Denies dizziness, seizures, syncope, weakness, light-headedness, numbness and headaches.  Hematological: Denies adenopathy. Easy bruising, personal or family bleeding history  Psychiatric/Behavioral: No anxiety or depression     Physical Exam:    BP 109/74   Pulse 63   Temp 98.4 F (36.9 C)   Ht '5\' 9"'$  (1.753 m)   Wt 229 lb (103.9 kg)   SpO2 100%   BMI 33.82 kg/m  Filed Weights   11/25/21 1021  Weight: 229 lb (103.9 kg)   Constitutional:  Well-developed, in no acute distress. Psychiatric: Normal mood and affect. Behavior is normal. HEENT: Pupils normal.  Conjunctivae are normal. No scleral icterus. Small aphthous erosion right cheek. Neck supple.  Cardiovascular: Normal rate, regular rhythm. No edema Pulmonary/chest: Effort normal and breath sounds normal. No wheezing, rales or rhonchi. Abdominal: Soft, nondistended. Epi tender. Bowel sounds active throughout. There are  no masses palpable. No hepatomegaly. Rectal:  defered Neurological: Alert and oriented to person place and time. Skin: Skin is warm and dry. No rashes noted.  Data Reviewed: I have personally reviewed following labs and imaging studies  CBC:    Latest Ref Rng & Units 10/07/2021    9:42 AM 09/08/2021   10:45 AM 06/05/2021    4:37 PM  CBC  WBC 4.0 - 10.5 K/uL 3.5  4.8  7.1   Hemoglobin 12.0 - 15.0 g/dL 12.8  12.8  13.3   Hematocrit 36.0 - 46.0 % 38.8  39.3  40.4   Platelets 150.0 - 400.0 K/uL 267.0  325.0  324     CMP:    Latest Ref Rng & Units 10/07/2021    9:42 AM 09/08/2021   10:45 AM 06/05/2021    4:27 PM  CMP  Glucose 70 - 99 mg/dL 99  88  105   BUN 6 - 23 mg/dL '11  16  19   '$ Creatinine 0.40 - 1.20 mg/dL 0.85  0.93  0.70   Sodium 135 - 145 mEq/L 138  141  135   Potassium 3.5 - 5.1 mEq/L 4.3  4.4  4.1   Chloride 96 - 112 mEq/L 104  102  100   CO2 19 - 32 mEq/L 30  32  26   Calcium 8.4 - 10.5 mg/dL 9.6  9.7  9.3   Total Protein 6.0 - 8.3 g/dL 6.9  7.0  7.5   Total Bilirubin 0.2 - 1.2 mg/dL 0.3  0.5  0.2   Alkaline Phos 39 - 117 U/L 70  83  68   AST 0 - 37 U/L '23  13  22   '$ ALT 0 - 35 U/L '29  22  31     '$ GFR:   Radiology Studies: No results found.     Carmell Austria, MD   Cc: Debbrah Alar, NP

## 2021-11-26 ENCOUNTER — Other Ambulatory Visit: Payer: Self-pay | Admitting: Family

## 2021-11-26 ENCOUNTER — Telehealth: Payer: Self-pay | Admitting: *Deleted

## 2021-11-26 DIAGNOSIS — R232 Flushing: Secondary | ICD-10-CM

## 2021-11-26 NOTE — Telephone Encounter (Signed)
  Follow up Call-     11/25/2021   10:23 AM  Call back number  Post procedure Call Back phone  # [phone]  Permission to leave phone message Yes     Patient questions:  Do you have a fever, pain , or abdominal swelling? No. Pain Score  0 *  Have you tolerated food without any problems? Yes.    Have you been able to return to your normal activities? Yes.    Do you have any questions about your discharge instructions: Diet   No. Medications  No. Follow up visit  No.  Do you have questions or concerns about your Care? No.  Actions: * If pain score is 4 or above: No action needed, pain <4.

## 2021-11-30 DIAGNOSIS — M25562 Pain in left knee: Secondary | ICD-10-CM | POA: Diagnosis not present

## 2021-11-30 DIAGNOSIS — M6289 Other specified disorders of muscle: Secondary | ICD-10-CM | POA: Diagnosis not present

## 2021-11-30 DIAGNOSIS — M25561 Pain in right knee: Secondary | ICD-10-CM | POA: Diagnosis not present

## 2021-11-30 DIAGNOSIS — M5459 Other low back pain: Secondary | ICD-10-CM | POA: Diagnosis not present

## 2021-12-02 ENCOUNTER — Ambulatory Visit: Payer: BC Managed Care – PPO | Admitting: Obstetrics & Gynecology

## 2021-12-02 DIAGNOSIS — E7211 Homocystinuria: Secondary | ICD-10-CM | POA: Diagnosis not present

## 2021-12-02 DIAGNOSIS — E039 Hypothyroidism, unspecified: Secondary | ICD-10-CM | POA: Diagnosis not present

## 2021-12-02 DIAGNOSIS — E063 Autoimmune thyroiditis: Secondary | ICD-10-CM | POA: Diagnosis not present

## 2021-12-02 DIAGNOSIS — E7212 Methylenetetrahydrofolate reductase deficiency: Secondary | ICD-10-CM | POA: Diagnosis not present

## 2021-12-02 DIAGNOSIS — E782 Mixed hyperlipidemia: Secondary | ICD-10-CM | POA: Diagnosis not present

## 2021-12-03 DIAGNOSIS — R7309 Other abnormal glucose: Secondary | ICD-10-CM | POA: Diagnosis not present

## 2021-12-03 DIAGNOSIS — E279 Disorder of adrenal gland, unspecified: Secondary | ICD-10-CM | POA: Diagnosis not present

## 2021-12-05 ENCOUNTER — Encounter: Payer: Self-pay | Admitting: Gastroenterology

## 2021-12-09 DIAGNOSIS — M5459 Other low back pain: Secondary | ICD-10-CM | POA: Diagnosis not present

## 2021-12-09 DIAGNOSIS — M6289 Other specified disorders of muscle: Secondary | ICD-10-CM | POA: Diagnosis not present

## 2021-12-09 DIAGNOSIS — M25562 Pain in left knee: Secondary | ICD-10-CM | POA: Diagnosis not present

## 2021-12-09 DIAGNOSIS — M25561 Pain in right knee: Secondary | ICD-10-CM | POA: Diagnosis not present

## 2021-12-11 ENCOUNTER — Other Ambulatory Visit: Payer: Self-pay

## 2021-12-11 ENCOUNTER — Encounter: Payer: Self-pay | Admitting: Family

## 2021-12-11 ENCOUNTER — Ambulatory Visit (INDEPENDENT_AMBULATORY_CARE_PROVIDER_SITE_OTHER): Payer: BC Managed Care – PPO | Admitting: Family

## 2021-12-11 ENCOUNTER — Emergency Department (HOSPITAL_BASED_OUTPATIENT_CLINIC_OR_DEPARTMENT_OTHER)
Admission: EM | Admit: 2021-12-11 | Discharge: 2021-12-11 | Disposition: A | Discharge status: Home / Self Care | Payer: BC Managed Care – PPO | Attending: Emergency Medicine | Admitting: Emergency Medicine

## 2021-12-11 ENCOUNTER — Emergency Department (HOSPITAL_BASED_OUTPATIENT_CLINIC_OR_DEPARTMENT_OTHER): Payer: BC Managed Care – PPO

## 2021-12-11 ENCOUNTER — Encounter (HOSPITAL_BASED_OUTPATIENT_CLINIC_OR_DEPARTMENT_OTHER): Payer: Self-pay | Admitting: Emergency Medicine

## 2021-12-11 ENCOUNTER — Encounter: Payer: Self-pay | Admitting: Physician Assistant

## 2021-12-11 ENCOUNTER — Ambulatory Visit (INDEPENDENT_AMBULATORY_CARE_PROVIDER_SITE_OTHER): Payer: BC Managed Care – PPO | Admitting: Physician Assistant

## 2021-12-11 VITALS — BP 126/88 | HR 79 | Resp 18 | Ht 69.0 in | Wt 224.0 lb

## 2021-12-11 DIAGNOSIS — M25562 Pain in left knee: Secondary | ICD-10-CM

## 2021-12-11 DIAGNOSIS — M7989 Other specified soft tissue disorders: Secondary | ICD-10-CM | POA: Diagnosis not present

## 2021-12-11 DIAGNOSIS — M25462 Effusion, left knee: Secondary | ICD-10-CM | POA: Diagnosis not present

## 2021-12-11 MED ORDER — MELOXICAM 15 MG PO TABS: 15.0000 mg | ORAL_TABLET | Freq: Every day | ORAL | 0 refills | Status: DC

## 2021-12-11 MED ORDER — ONDANSETRON HCL 4 MG/2ML IJ SOLN: 4.0000 mg | Freq: Once | INTRAMUSCULAR | Status: DC

## 2021-12-11 MED ORDER — DIPHENHYDRAMINE HCL 25 MG PO CAPS
25.0000 mg | ORAL_CAPSULE | Freq: Once | ORAL | Status: AC
  Administered 2021-12-11: 25 mg via ORAL
  Filled 2021-12-11: qty 1

## 2021-12-11 MED ORDER — METHYLPREDNISOLONE ACETATE 40 MG/ML IJ SUSP
80.0000 mg | INTRAMUSCULAR | Status: AC | PRN
  Administered 2021-12-11: 80 mg via INTRA_ARTICULAR

## 2021-12-11 MED ORDER — HYDROCODONE-ACETAMINOPHEN 5-325 MG PO TABS
1.0000 | ORAL_TABLET | Freq: Once | ORAL | Status: AC
  Administered 2021-12-11: 1 via ORAL
  Filled 2021-12-11: qty 1

## 2021-12-11 MED ORDER — HYDROCODONE-ACETAMINOPHEN 5-325 MG PO TABS: 1.0000 | ORAL_TABLET | ORAL | 0 refills | Status: DC | PRN

## 2021-12-11 MED ORDER — LIDOCAINE HCL 1 % IJ SOLN
5.0000 mL | INTRAMUSCULAR | Status: AC | PRN
  Administered 2021-12-11: 5 mL

## 2021-12-11 NOTE — ED Provider Notes (Signed)
[name] Note   CSN: [id number] Arrival date & time: 12/11/21  1209     History  Chief Complaint  Patient presents with   Leg Injury    Kim Pham is a 41 y.o. female with a past medical history of Raynaud's and lupus presenting today with left knee pain.  She reports that last weekend she was moving her son into college and a futon hit her in her knee.  She has been ambulatory but she says it is extremely painful.  Has been trying 1000 mg Tylenol 4 times daily, 800 mg ibuprofen 3 times a day and sporadic naproxen.  These, ice and heat are not resolving her symptoms.  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  Was seen by PCP this morning who said that it might need to be drained so they sent her here.  HPI     Home Medications Prior to Admission medications   Medication Sig Start Date End Date Taking? Authorizing Provider  cyclobenzaprine (FLEXERIL) 5 MG tablet 1-2 tabs by mouth at bedtime as needed for back pain. 09/04/21   Debbrah Alar, NP  fluticasone (FLONASE) 50 MCG/ACT nasal spray Place 2 sprays into both nostrils daily as needed for allergies or rhinitis. 06/01/21   Debbrah Alar, NP  gabapentin (NEURONTIN) 300 MG capsule TAKE ONE CAPSULE BY MOUTH AT BEDTIME 11/26/21   Debbrah Alar, NP  meloxicam (MOBIC) 15 MG tablet Take 1 tablet (15 mg total) by mouth daily. 12/11/21   Marrian Salvage, FNP  pantoprazole (PROTONIX) 40 MG tablet Take 1 tablet (40 mg total) by mouth daily. 10/07/21   Jackquline Denmark, MD  PARoxetine (PAXIL) 20 MG tablet Take 1 tablet (20 mg total) by mouth at bedtime. 08/13/21   Truett Mainland, DO  SUMAtriptan (IMITREX) 100 MG tablet Take 1 tablet (100 mg total) by mouth at onset of migraine. May repeat in 2 hours if headache persists or recurs. Do not take more than 2 doses in 24 hours. 06/29/17   Melvenia Beam, MD  traZODone (DESYREL) 100 MG tablet TAKE TWO  TABLETS BY MOUTH EVERY NIGHT AT BEDTIME 10/15/21   Debbrah Alar, NP      Allergies    Effexor [venlafaxine], Tramadol, Norco [hydrocodone-acetaminophen], and Voltaren [diclofenac]    Review of Systems   Review of Systems  Physical Exam Updated Vital Signs BP 116/75   Pulse 69   Temp 97.7 F (36.5 C) (Oral)   Resp 18   Ht '5\' 9"'$  (1.753 m)   Wt 102.1 kg   SpO2 97%   BMI 33.23 kg/m  Physical Exam Vitals and nursing note reviewed.  Constitutional:      Appearance: Normal appearance.  HENT:     Head: Normocephalic and atraumatic.  Eyes:     General: No scleral icterus.    Conjunctiva/sclera: Conjunctivae normal.  Pulmonary:     Effort: Pulmonary effort is normal. No respiratory distress.  Musculoskeletal:        General: Tenderness (To the lateral left knee) present. No swelling, deformity or signs of injury.     Comments: Full range of motion of the left knee.  Strong DP pulse.  No obvious effusion.  Skin:    General: Skin is warm and dry.     Findings: No rash.  Neurological:     Mental Status: She is alert.  Psychiatric:        Mood and Affect:  Mood normal.     ED Results / Procedures / Treatments   Labs (all labs ordered are listed, but only abnormal results are displayed) Labs Reviewed - No data to display  EKG None  Radiology DG Knee Complete 4 Views Left  Result Date: 12/11/2021 CLINICAL DATA:  Pain and swelling status post injury EXAM: LEFT KNEE - COMPLETE 4+ VIEW COMPARISON:  06/30/2021 FINDINGS: No fracture or dislocation. Small knee joint effusion. Joint spaces maintained. IMPRESSION: Small knee joint effusion without underlying fracture or dislocation. Electronically Signed   By: Miachel Roux M.D.   On: 12/11/2021 13:02    Procedures Procedures   Medications Ordered in ED Medications  HYDROcodone-acetaminophen (NORCO/VICODIN) 5-325 MG per tablet 1 tablet (1 tablet Oral Given 12/11/21 1238)  diphenhydrAMINE (BENADRYL) capsule 25 mg (25 mg Oral  Given 12/11/21 1238)    ED Course/ Medical Decision Making/ A&P Clinical Course as of 12/11/21 1233  Fri Dec 11, 2021  1233 Patient specifically told me that she has had vascular before and sometimes it makes her itch but no anaphylactic reaction.  She would like to try it and I will give her Benadryl with it. [MR]    Clinical Course User Index [MR] Johnpaul Gillentine, Cecilio Asper, PA-C                           Medical Decision Making Amount and/or Complexity of Data Reviewed Radiology: ordered.  Risk Prescription drug management.   41 year old female presenting with knee pain.  Differential includes but is not limited to septic joint, osteoarthritis, rheumatoid arthritis, knee effusion, ligamental or meniscus injury.  This is not exhaustive.  Physical exam: Benign.  Some lateral tenderness however no obvious knee effusion or overlying cellulitis  Treatment: Given Vicodin with Benadryl because patient says it sometimes makes her itch.  Denies shortness of breath or angioedema.  Request to go forward with this medication.  Imaging: X-ray ordered, viewed and interpreted by me.  I agree with radiologist that there is only a small knee effusion.  MDM/disposition: Patient presents with a left knee injury.  She thinks it started when she was hit in the knee with a futon over the past weekend while moving.  Neurovascularly intact.  X-ray without concerning findings.  I believe patient is stable for discharge home and PCP follow-up.  I will also give her referral to sports medicine in the event that this is not getting any better after 1 to 2 weeks.  She is agreeable to the plan.   Final Clinical Impression(s) / ED Diagnoses Final diagnoses:  Acute pain of left knee    Rx / DC Orders ED Discharge Orders     None      Results and diagnoses were explained to the patient. Return precautions discussed in full. Patient had no additional questions and expressed complete understanding.   This chart  was dictated using voice recognition software.  Despite best efforts to proofread,  errors can occur which can change the documentation meaning.    Rhae Hammock, PA-C 12/11/21 1313    Ottie Glazier, DO 12/11/21 1443

## 2021-12-11 NOTE — ED Notes (Addendum)
Patient states that she get rash sometime when she takes hydrocodone with acetaminophen . Denis any sob with medication . Provider is aware patient was given benadryl also. Will continue to monitor

## 2021-12-11 NOTE — Progress Notes (Signed)
Office Visit Note   Patient: Kim Pham           Date of Birth: 06/04/1980           MRN: [id number] Visit Date: 12/11/2021              Requested by: Debbrah Alar, NP [address] [place],  [place] [address] PCP: Debbrah Alar, NP  Chief Complaint  Patient presents with  . Left Knee - Follow-up      HPI: Patient is a pleasant 41 year old woman with a 5-day history of left knee swelling and pain.  Her and her husband were helping her son move into his college dorm over the weekend and the futon they were carrying slipped and fell on her knee.  She does think she twisted it at that time and did feel a pop.  She has a history of chondromalacia.  She has had an arthroscopy on the right knee with Dr. Durward Fortes.  She did have x-rays that did not show any fracture she points to most of her pain laterally on the joint line.  She cannot really take anti-inflammatories as there was concerns that it caused GI issues in the past.  She says her knee just does not feel right.  She has pain on the anterior shin but no calf pain.    Assessment & Plan: Visit Diagnoses:  1. Acute pain of left knee     Plan: Patient had a significant clear serous effusion of 60 cc.  I did also place some steroid in her knee.  We will give her a hinged knee brace.  She will follow-up with Dr. Durward Fortes next week.  I called her in a small amount of hydrocodone.  She has tolerated this in the past and confirmed that she only got a mild rash and this does not always happen.    Follow-Up Instructions: 1 week  Ortho Exam  Patient is alert, oriented, no adenopathy, well-dressed, normal affect, normal respiratory effort. Left knee she has a moderate effusion.  No redness no erythema patellar mobility is fairly good.  She is able to sustain a straight leg raise.  Her calf is soft and nontender she does have some mild swelling in her ankle when she flexes her ankle up passively has a pain in  the anterior knee but none in the calf at all.  She has no pain with manipulation of her hip.  Distal sensation is intact.  She is able to plantarflex and dorsiflex her ankle  Imaging: DG Knee Complete 4 Views Left  Result Date: 12/11/2021 CLINICAL DATA:  Pain and swelling status post injury EXAM: LEFT KNEE - COMPLETE 4+ VIEW COMPARISON:  06/30/2021 FINDINGS: No fracture or dislocation. Small knee joint effusion. Joint spaces maintained. IMPRESSION: Small knee joint effusion without underlying fracture or dislocation. Electronically Signed   By: Miachel Roux M.D.   On: 12/11/2021 13:02   No images are attached to the encounter.  Labs: Lab Results  Component Value Date   ESRSEDRATE 6 07/05/2017   ESRSEDRATE 4 08/23/2016   ESRSEDRATE 2 03/08/2016   CRP <1.0 10/07/2021     Lab Results  Component Value Date   ALBUMIN 4.4 10/07/2021   ALBUMIN 4.5 09/08/2021   ALBUMIN 4.4 06/05/2021    No results found for: "MG" Lab Results  Component Value Date   VD25OH 41 09/29/2021   VD25OH 25 (L) 07/05/2017   VD25OH 28.64 (L) 11/21/2014  No results found for: "PREALBUMIN"    Latest Ref Rng & Units 10/07/2021    9:42 AM 09/08/2021   10:45 AM 06/05/2021    4:37 PM  CBC EXTENDED  WBC 4.0 - 10.5 K/uL 3.5  4.8  7.1   RBC 3.87 - 5.11 Mil/uL 4.47  4.51  4.67   Hemoglobin 12.0 - 15.0 g/dL 12.8  12.8  13.3   HCT 36.0 - 46.0 % 38.8  39.3  40.4   Platelets 150.0 - 400.0 K/uL 267.0  325.0  324   NEUT# 1.4 - 7.7 K/uL 1.9  2.2  4.3   Lymph# 0.7 - 4.0 K/uL 1.2  2.1  2.1      There is no height or weight on file to calculate BMI.  Orders:  No orders of the defined types were placed in this encounter.  Meds ordered this encounter  Medications  . HYDROcodone-acetaminophen (NORCO/VICODIN) 5-325 MG tablet    Sig: Take 1 tablet by mouth every 4 (four) hours as needed for moderate pain.    Dispense:  20 tablet    Refill:  0     Procedures: Large Joint Inj: L knee on 12/11/2021 2:40  PM Indications: pain and diagnostic evaluation Details: 25 G 1.5 in needle, superolateral approach  Arthrogram: No  Medications: 80 mg methylPREDNISolone acetate 40 MG/ML; 5 mL lidocaine 1 % Aspirate: 60 mL clear Outcome: tolerated well, no immediate complications  After obtaining verbal consent the superior lateral knee was cleaned with Betadine and alcohol.  She was injected with 3 cc of lidocaine plain.  After adequate anesthetic was achieved she was aspirated 60 cc of clear slightly yellow-tinged fluid without any blood present 80 cc of Depo-Medrol was injected.  She tolerated the procedure well Procedure, treatment alternatives, risks and benefits explained, specific risks discussed. Consent was given by the patient.    Clinical Data: No additional findings.  ROS:  All other systems negative, except as noted in the HPI. Review of Systems  Objective: Vital Signs: There were no vitals taken for this visit.  Specialty Comments:  No specialty comments available.  PMFS History: Patient Active Problem List   Diagnosis Date Noted  . Preventative health care 09/08/2021  . Blood in stool 09/08/2021  . Hyperlipidemia 09/08/2021  . Back pain 09/04/2021  . Pelvic pain in female 08/18/2021  . Chondromalacia, patella 06/30/2021  . Family history of BRCA gene mutation 06/17/2021  . Cyst of left ovary 06/17/2021  . Vasomotor symptoms due to menopause 06/17/2021  . Rectal bleeding 06/11/2021  . Adnexal mass 06/11/2021  . Hot flashes 06/11/2021  . Constipation 06/11/2021  . Right lower quadrant abdominal pain 06/05/2021  . Pain in right knee 11/13/2020  . Insomnia 08/19/2020  . Gastroesophageal reflux disease 08/19/2020  . Psychogenic nonepileptic seizure 07/27/2017  . Chronic migraine without aura, with intractable migraine, so stated, with status migrainosus 07/27/2017  . Diarrhea 07/08/2017  . Nonepileptic episode ([place]) 12/22/2016  . Sicca syndrome ([place]) 11/22/2016  .  Raynaud's disease without gangrene 11/22/2016  . Hair loss 11/22/2016  . Photosensitivity 11/22/2016  . History of Chiari malformation 11/22/2016  . High risk medication use 11/22/2016  . Arthralgia 08/18/2016  . Fatigue 03/26/2016  . SLE (systemic lupus erythematosus) ([place]) 03/26/2016  . Depression with anxiety 03/26/2016  . Numbness and tingling 02/25/2016  . Transient alteration of awareness 01/01/2016  . Chiari malformation 12/19/2015   Past Medical History:  Diagnosis Date  . Adnexal mass 02/24/2021   in epic  seen on [place]  . Amenorrhea 08/14/2021  . Anxiety 08/14/2021  . Chronic constipation 08/14/2021  . Chronic headaches HISTORY OF MIGRAINES 08/14/2021  . Complication of anesthesia 08/14/2021   PONV  . Depression   . Endometriosis   . GERD (gastroesophageal reflux disease)    tums otc, prevacid prn  . history of Chiari malformation   . history of COVID 02/2021   LOW GRADE FEVER CONGESTION AND HEADACHE ALL SYMPTOMS RESOLVED  . HISTORY OF MIGRAINES 08/14/2021  . Hyperlipidemia   . Insomnia 08/19/2020  . Low blood pressure    pt states history of low blood pressure-fainted 12/2010-instructed to move slowly  . OSTEOARTHRITIS 08/14/2021  . SLE (systemic lupus erythematosus) ([place]) 03/26/2016  . Wears glasses for reading 08/14/2021    Family History  Problem Relation Age of Onset  . Hypertension Mother   . CAD Mother   . Breast cancer Mother   . Migraines Mother   . Gout Mother   . Heart disease Mother   . Crohn's disease Mother   . Hypertension Father   . CAD Father   . CVA Father   . Heart disease Father   . Colon polyps Father   . Liver disease Father   . Sjogren's syndrome Sister   . Celiac disease Sister   . Rheum arthritis Maternal Grandmother   . Dementia Maternal Grandmother   . Pancreatic cancer Maternal Grandfather   . CAD Paternal Grandmother   . Macular degeneration Paternal Grandmother   . CVA Paternal Grandfather   . Autism Son   . ADD / ADHD  Son   . Breast cancer Maternal Aunt   . Esophageal cancer Neg Hx   . Colon cancer Neg Hx   . Stomach cancer Neg Hx     Past Surgical History:  Procedure Laterality Date  . ABDOMINAL HYSTERECTOMY  2007  . CYSTOSCOPY  2007   WITH HYSTERECTOMY  . DIAGNOSTIC LAPAROSCOPY  2005  . OOPHORECTOMY Left 08/18/2021  . ROTATOR CUFF REPAIR Right 2010  . SALPINGOOPHORECTOMY  04/16/2011   Procedure: SALPINGO OOPHERECTOMY;  Surgeon: Lovenia Kim, MD;  Location: [facility];  Service: Gynecology;  Laterality: Right;  . skull decompression surgery 2005     @ [place] History   Occupational History  . Occupation: nurse  Tobacco Use  . Smoking status: Former    Packs/day: 1.00    Years: 10.00    Total pack years: 10.00    Types: Cigarettes    Quit date: 04/04/2004    Years since quitting: 17.6    Passive exposure: Past  . Smokeless tobacco: Never  Vaping Use  . Vaping Use: Never used  Substance and Sexual Activity  . Alcohol use: Not Currently  . Drug use: Never  . Sexual activity: Yes    Birth control/protection: Surgical

## 2021-12-11 NOTE — ED Triage Notes (Addendum)
Pt arrives pov, slow gait, c/o LLE pain after "catching futon with leg" x 6 days pta. Pt c/o pain and swelling. Ibuprofen '800mg'$  at 0730 with no relief.Pt endorses knee pain worst

## 2021-12-11 NOTE — Progress Notes (Signed)
Kim Pham is a 41 y.o. female with the following history as recorded in EpicCare:  Patient Active Problem List   Diagnosis Date Noted   Preventative health care 09/08/2021   Blood in stool 09/08/2021   Hyperlipidemia 09/08/2021   Back pain 09/04/2021   Pelvic pain in female 08/18/2021   Chondromalacia, patella 06/30/2021   Family history of BRCA gene mutation 06/17/2021   Cyst of left ovary 06/17/2021   Vasomotor symptoms due to menopause 06/17/2021   Rectal bleeding 06/11/2021   Adnexal mass 06/11/2021   Hot flashes 06/11/2021   Constipation 06/11/2021   Right lower quadrant abdominal pain 06/05/2021   Pain in right knee 11/13/2020   Insomnia 08/19/2020   Gastroesophageal reflux disease 08/19/2020   Psychogenic nonepileptic seizure 07/27/2017   Chronic migraine without aura, with intractable migraine, so stated, with status migrainosus 07/27/2017   Diarrhea 07/08/2017   Nonepileptic episode ([place]) 12/22/2016   Sicca syndrome ([place]) 11/22/2016   Raynaud's disease without gangrene 11/22/2016   Hair loss 11/22/2016   Photosensitivity 11/22/2016   History of Chiari malformation 11/22/2016   High risk medication use 11/22/2016   Arthralgia 08/18/2016   Fatigue 03/26/2016   SLE (systemic lupus erythematosus) ([place]) 03/26/2016   Depression with anxiety 03/26/2016   Numbness and tingling 02/25/2016   Transient alteration of awareness 01/01/2016   Chiari malformation 12/19/2015    Current Outpatient Medications  Medication Sig Dispense Refill   cyclobenzaprine (FLEXERIL) 5 MG tablet 1-2 tabs by mouth at bedtime as needed for back pain. 30 tablet 0   fluticasone (FLONASE) 50 MCG/ACT nasal spray Place 2 sprays into both nostrils daily as needed for allergies or rhinitis. 16 g 1   gabapentin (NEURONTIN) 300 MG capsule TAKE ONE CAPSULE BY MOUTH AT BEDTIME 90 capsule 1   meloxicam (MOBIC) 15 MG tablet Take 1 tablet (15 mg total) by mouth daily. 30 tablet 0   pantoprazole  (PROTONIX) 40 MG tablet Take 1 tablet (40 mg total) by mouth daily. 90 tablet 3   PARoxetine (PAXIL) 20 MG tablet Take 1 tablet (20 mg total) by mouth at bedtime. 90 tablet 3   SUMAtriptan (IMITREX) 100 MG tablet Take 1 tablet (100 mg total) by mouth at onset of migraine. May repeat in 2 hours if headache persists or recurs. Do not take more than 2 doses in 24 hours. 10 tablet 2   traZODone (DESYREL) 100 MG tablet TAKE TWO TABLETS BY MOUTH EVERY NIGHT AT BEDTIME 180 tablet 1   No current facility-administered medications for this visit.    Allergies: Effexor [venlafaxine], Tramadol, Norco [hydrocodone-acetaminophen], and Voltaren [diclofenac]  Past Medical History:  Diagnosis Date   Adnexal mass 02/24/2021   in epic seen on [place]   Amenorrhea 08/14/2021   Anxiety 08/14/2021   Chronic constipation 08/14/2021   Chronic headaches HISTORY OF MIGRAINES 85/27/7824   Complication of anesthesia 08/14/2021   PONV   Depression    Endometriosis    GERD (gastroesophageal reflux disease)    tums otc, prevacid prn   history of Chiari malformation    history of COVID 02/2021   LOW GRADE FEVER CONGESTION AND HEADACHE ALL SYMPTOMS RESOLVED   HISTORY OF MIGRAINES 08/14/2021   Hyperlipidemia    Insomnia 08/19/2020   Low blood pressure    pt states history of low blood pressure-fainted 12/2010-instructed to move slowly   OSTEOARTHRITIS 08/14/2021   SLE (systemic lupus erythematosus) ([place]) 03/26/2016   Wears glasses for reading 08/14/2021    Past Surgical History:  Procedure Laterality Date   ABDOMINAL HYSTERECTOMY  2007   CYSTOSCOPY  2007   WITH HYSTERECTOMY   DIAGNOSTIC LAPAROSCOPY  2005   OOPHORECTOMY Left 08/18/2021   ROTATOR CUFF REPAIR Right 2010   SALPINGOOPHORECTOMY  04/16/2011   Procedure: SALPINGO OOPHERECTOMY;  Surgeon: Lovenia Kim, MD;  Location: [facility];  Service: Gynecology;  Laterality: Right;   skull decompression surgery 2005     @ [place] History  Problem  Relation Age of Onset   Hypertension Mother    CAD Mother    Breast cancer Mother    Migraines Mother    Gout Mother    Heart disease Mother    Crohn's disease Mother    Hypertension Father    CAD Father    CVA Father    Heart disease Father    Colon polyps Father    Liver disease Father    Sjogren's syndrome Sister    Celiac disease Sister    Rheum arthritis Maternal Grandmother    Dementia Maternal Grandmother    Pancreatic cancer Maternal Grandfather    CAD Paternal Grandmother    Macular degeneration Paternal Grandmother    CVA Paternal Grandfather    Autism Son    ADD / ADHD Son    Breast cancer Maternal Aunt    Esophageal cancer Neg Hx    Colon cancer Neg Hx    Stomach cancer Neg Hx     Social History   Tobacco Use   Smoking status: Former    Packs/day: 1.00    Years: 10.00    Total pack years: 10.00    Types: Cigarettes    Quit date: 04/04/2004    Years since quitting: 17.6    Passive exposure: Past   Smokeless tobacco: Never  Substance Use Topics   Alcohol use: Not Currently    Subjective:   Left knee pain x 7 days; notes that injured her knee while helping her son move into his dorm last weekend; is experiencing swelling over lateral left knee; limited benefit with OTC NSAIDs; does have orthopedist but has not been able to get scheduled at that office;    Objective:  Vitals:   12/11/21 1141  BP: 126/88  Pulse: 79  Resp: 18  SpO2: 98%  Weight: 224 lb (101.6 kg)  Height: 5' 9"  (1.753 m)    General: Well developed, well nourished, in no acute distress  Skin : Warm and dry.  Head: Normocephalic and atraumatic  Lungs: Respirations unlabored; clear to auscultation bilaterally without wheeze, rales, rhonchi  Musculoskeletal: No deformities; swelling noted over later left upper knee Extremities: No edema, cyanosis, clubbing  Vessels: Symmetric bilaterally  Neurologic: Alert and oriented; speech intact; face symmetrical; moves all extremities well;  CNII-XII intact without focal deficit   Assessment:  1. Acute pain of left knee     Plan:  Suspected left knee effusion; will set up STAT referral for patient to orthopedist with the hope that she can be seen for evaluation this afternoon; at time of OV, referral coordinator is at lunch and explained to patient that I would call her back with treatment plan by 1 pm; We were able to get her an ortho appointment at 2 pm today- in the interim, patient opted to go to ER and at time of closing this note, VM and MyChart message has been sent to patient to let her know about upcoming appointment this afternoon.   No follow-ups on file.  Orders Placed This Encounter  Procedures   Ambulatory referral to Orthopedic Surgery    Referral Priority:   Emergency    Referral Type:   Surgical    Referral Reason:   Specialty Services Required    Referred to Provider:   Garald Balding, MD    Requested Specialty:   Orthopedic Surgery    Number of Visits Requested:   1    Requested Prescriptions   Signed Prescriptions Disp Refills   meloxicam (MOBIC) 15 MG tablet 30 tablet 0    Sig: Take 1 tablet (15 mg total) by mouth daily.

## 2021-12-11 NOTE — Discharge Instructions (Addendum)
You have a very small knee effusion.  This is something that usually resolves on its own but you may follow-up with your PCP or sports medicine.  Both are attached to these discharge papers.  A work note for the next few days is also attached.

## 2021-12-11 NOTE — ED Notes (Addendum)
Patient is alert x4 Able to move left left  and feet. Has some swelling in her thigh area and knee. States that she can bear wt but that it hurts  took 800 mg of ibuprofen had not relief. States that the pain is radiating into ankle also

## 2021-12-11 NOTE — ED Notes (Signed)
Patient verbalize understanding of her discharge papers to the provider. Patient left discharge patient and did not sign out

## 2021-12-12 ENCOUNTER — Encounter: Payer: Self-pay | Admitting: Rheumatology

## 2021-12-12 ENCOUNTER — Encounter: Payer: Self-pay | Admitting: Family

## 2021-12-14 ENCOUNTER — Telehealth: Payer: Self-pay

## 2021-12-14 NOTE — Telephone Encounter (Signed)
Nurse Assessment Nurse: Patsey Berthold, RN, Roma Kayser Date/Time Eilene Ghazi Time): 12/14/2021 12:19:56 PM Confirm and document reason for call. If symptomatic, describe symptoms. ---Caller states that she has increased heart rate. She is c/o shortness of breath as well. Started occasionally, is now several times per week. She feels woozy, her heart beats faster. When checking her pulse its 120-140 bpm lasting 3-7 minutes. Has appointment on Thursday, does not feel symptoms now. Does the patient have any new or worsening symptoms? ---Yes Will a triage be completed? ---Yes Related visit to physician within the last 2 weeks? ---Yes Does the PT have any chronic conditions? (i.e. diabetes, asthma, this includes High risk factors for pregnancy, etc.) ---Yes List chronic conditions. ---Has had COVID 4xs- last dx in Winter 2022- pos ddimer with mild cardiomegaly and atelectasis. Is the patient pregnant or possibly pregnant? (Ask all females between the ages of 10-55) ---No Is this a behavioral health or substance abuse call? ---No Guidelines Guideline Title Affirmed Question Affirmed Notes Nurse Date/Time Eilene Ghazi Time) Breathing Difficulty [1] MILD difficulty breathing (e.g., minimal/no SOB at rest, SOB with Vivi Ferns 12/14/2021 12:23:55 PM PLEASE NOTE: All timestamps contained within this report are represented as [place] Standard Time. CONFIDENTIALTY NOTICE: This fax transmission is intended only for the addressee. It contains information that is legally privileged, confidential or otherwise protected from use or disclosure. If you are not the intended recipient, you are strictly prohibited from reviewing, disclosing, copying using or disseminating any of this information or taking any action in reliance on or regarding this information. If you have received this fax in error, please notify us immediately by telephone so that we can arrange for its return to [place]. Phone: [phone],  Toll-Free: [phone], Fax: [phone] Page: 2 of 3 Call Id: [id number] Guidelines Guideline Title Affirmed Question Affirmed Notes Nurse Date/Time Eilene Ghazi Time) walking, pulse <100) AND [2] NEW-onset or WORSE than normal Disp. Time Eilene Ghazi Time) Disposition Final User 12/14/2021 12:17:55 PM Send to Urgent [facility] 12/14/2021 12:36:16 PM See HCP within 4 Hours (or PCP triage) Yes Patsey Berthold, RN, Roma Kayser Final Disposition 12/14/2021 12:36:16 PM See HCP within 4 Hours (or PCP triage) Yes Patsey Berthold, RN, Melvyn Novas Disagree/Comply Disagree Caller Understands Yes PreDisposition Call Doctor Care Advice Given Per Guideline SEE HCP (OR PCP TRIAGE) WITHIN 4 HOURS: * IF OFFICE WILL BE OPEN: You need to be seen within the next 3 or 4 hours. Call your doctor (or NP/PA) now or as soon as the office opens. CALL BACK IF: * You become worse * IF OFFICE WILL BE CLOSED AND NO PCP (PRIMARY CARE PROVIDER) SECOND-LEVEL TRIAGE: You need to be seen within the next 3 or 4 hours. A nearby Urgent Care Center [facility]) is often a good source of care. Another choice is to go to the ED. Go sooner if you become worse. Comments User: Diana Eves, RN Date/Time Eilene Ghazi Time): 12/14/2021 12:21:38 PM Last episode this AM at 0630. Now feels winded, she was walking up stairs. User: Diana Eves, RN Date/Time Eilene Ghazi Time): 12/14/2021 12:25:54 PM Last two covid episodes were more severe. User: Diana Eves, RN Date/Time Eilene Ghazi Time): 12/14/2021 12:27:53 PM During events feels like heart is skipping, or has palpitations. User: Diana Eves, RN Date/Time Eilene Ghazi Time): 12/14/2021 12:29:33 PM Last episode was on Friday- was working. User: Diana Eves, RN Date/Time Eilene Ghazi Time): 12/14/2021 12:37:33 PM PLEASE NOTE: All timestamps contained within this report are represented as [place] Standard Time. CONFIDENTIALTY NOTICE: This fax transmission is intended only for the  addressee.  It contains information that is legally privileged, confidential or otherwise protected from use or disclosure. If you are not the intended recipient, you are strictly prohibited from reviewing, disclosing, copying using or disseminating any of this information or taking any action in reliance on or regarding this information. If you have received this fax in error, please notify us immediately by telephone so that we can arrange for its return to [place]. Phone: [phone], Toll-Free: [phone], Fax: [phone] Page: 3 of 3 Call Id: [id number] Comments Per back line no availability to be seen today. Caller directed to UC, she preferred to wait till appointment on Thursday. Referrals REFERRED TO PCP OFFICE

## 2021-12-14 NOTE — Telephone Encounter (Signed)
Reviewed attached lab results.  Please clarify if she will be following up with her PCP to discuss cholesterol panel results?   Autoimmune workup from 09/29/21 was negative.    No follow up visit is scheduled.

## 2021-12-14 NOTE — Telephone Encounter (Signed)
Patient reports last episode was this morning at about 6:30 am. Patient advised to go to the er if symptoms return. Will call her tomorrow for possible appointment.

## 2021-12-15 ENCOUNTER — Ambulatory Visit (INDEPENDENT_AMBULATORY_CARE_PROVIDER_SITE_OTHER): Payer: BC Managed Care – PPO | Admitting: Family

## 2021-12-15 VITALS — BP 101/75 | HR 62 | Temp 98.3°F | Resp 16 | Wt 224.0 lb

## 2021-12-15 DIAGNOSIS — R002 Palpitations: Secondary | ICD-10-CM

## 2021-12-15 HISTORY — DX: Palpitations: R00.2

## 2021-12-15 NOTE — Telephone Encounter (Signed)
Patient's appointment will be moved to this afternoon at provider's request

## 2021-12-15 NOTE — Assessment & Plan Note (Signed)
New. EKG tracing is personally reviewed.  EKG notes NSR.  No acute changes.  Will obtain labs as ordered. Refer to cardiology for further evaluation which is likely to include cardiac monitor.  She is advised to go to the ER if severe/worsening symptoms.

## 2021-12-15 NOTE — Progress Notes (Signed)
Subjective:     Patient ID: Kim Pham, female    DOB: 27-Jun-1980, 41 y.o.   MRN: [id number]  Chief Complaint  Patient presents with   Palpitations    Patient reports having palpitations once or twice a day a few times per week. This also includes dizziness, hot flashes and chest pain one time.     Palpitations    Patient is in today to discuss palpitations. Palpitations are occurring 1-2 times a day several times a week.  Has had some associated dizziness and hot flashes.  During one episode she had chest discomfort- like a "muscle cramp in the left breast area."    Reports that sometime last year she would get a feeling "like I was anxious but had no reason to be anxious."  Felt "like I had run an marathon." Symptoms last 5-7 minutes, has associated facial flushing and sweating.  Max HR during these episodes that she has recorded is 147 bpm.    Health Maintenance Due  Topic Date Due   COVID-19 Vaccine (5 - Mixed Product risk series) 04/07/2020   TETANUS/TDAP  10/17/2021   INFLUENZA VACCINE  11/17/2021    Past Medical History:  Diagnosis Date   Adnexal mass 02/24/2021   in epic seen on [place]   Amenorrhea 08/14/2021   Anxiety 08/14/2021   Chronic constipation 08/14/2021   Chronic headaches HISTORY OF MIGRAINES 28/78/6767   Complication of anesthesia 08/14/2021   PONV   Depression    Endometriosis    GERD (gastroesophageal reflux disease)    tums otc, prevacid prn   history of Chiari malformation    history of COVID 02/2021   LOW GRADE FEVER CONGESTION AND HEADACHE ALL SYMPTOMS RESOLVED   HISTORY OF MIGRAINES 08/14/2021   Hyperlipidemia    Insomnia 08/19/2020   Low blood pressure    pt states history of low blood pressure-fainted 12/2010-instructed to move slowly   OSTEOARTHRITIS 08/14/2021   SLE (systemic lupus erythematosus) ([place]) 03/26/2016   Wears glasses for reading 08/14/2021    Past Surgical History:  Procedure Laterality Date   ABDOMINAL HYSTERECTOMY   2007   CYSTOSCOPY  2007   WITH HYSTERECTOMY   DIAGNOSTIC LAPAROSCOPY  2005   OOPHORECTOMY Left 08/18/2021   ROTATOR CUFF REPAIR Right 2010   SALPINGOOPHORECTOMY  04/16/2011   Procedure: SALPINGO OOPHERECTOMY;  Surgeon: Lovenia Kim, MD;  Location: [facility];  Service: Gynecology;  Laterality: Right;   skull decompression surgery 2005     @ [place] History  Problem Relation Age of Onset   Hypertension Mother    CAD Mother    Breast cancer Mother    Migraines Mother    Gout Mother    Heart disease Mother    Crohn's disease Mother    Hypertension Father    CAD Father    CVA Father    Heart disease Father    Colon polyps Father    Liver disease Father    Sjogren's syndrome Sister    Celiac disease Sister    Rheum arthritis Maternal Grandmother    Dementia Maternal Grandmother    Pancreatic cancer Maternal Grandfather    CAD Paternal Grandmother    Macular degeneration Paternal Grandmother    CVA Paternal Grandfather    Autism Son    ADD / ADHD Son    Breast cancer Maternal Aunt    Esophageal cancer Neg Hx    Colon cancer Neg Hx    Stomach  cancer Neg Hx     Social History   Socioeconomic History   Marital status: Married    Spouse name: Not on file   Number of children: 1   Years of education: Assoc   Highest education level: Not on file  Occupational History   Occupation: nurse  Tobacco Use   Smoking status: Former    Packs/day: 1.00    Years: 10.00    Total pack years: 10.00    Types: Cigarettes    Quit date: 04/04/2004    Years since quitting: 17.7    Passive exposure: Past   Smokeless tobacco: Never  Vaping Use   Vaping Use: Never used  Substance and Sexual Activity   Alcohol use: Not Currently   Drug use: Never   Sexual activity: Yes    Birth control/protection: Surgical  Other Topics Concern   Not on file  Social History Narrative   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 30 tablet 0   fluticasone (FLONASE) 50 MCG/ACT nasal spray Place 2 sprays into both nostrils daily as needed for allergies or rhinitis. 16 g 1   gabapentin (NEURONTIN) 300 MG capsule TAKE ONE CAPSULE BY MOUTH AT BEDTIME 90 capsule 1   HYDROcodone-acetaminophen (NORCO/VICODIN) 5-325 MG tablet Take 1 tablet by mouth every 4 (four) hours as needed for moderate pain. 20 tablet 0   meloxicam (MOBIC) 15 MG tablet Take 1 tablet (15 mg total) by mouth daily. 30 tablet 0   pantoprazole (PROTONIX) 40 MG tablet Take 1 tablet (40 mg total) by mouth daily. 90 tablet 3   PARoxetine (PAXIL) 20 MG tablet Take 1 tablet (20 mg total) by mouth at bedtime. 90 tablet 3   SUMAtriptan (IMITREX) 100 MG tablet Take 1 tablet (100 mg total) by mouth at onset of migraine. May repeat in 2 hours if headache persists or recurs. Do not take more than 2 doses in 24 hours. 10 tablet 2   traZODone (DESYREL) 100 MG tablet TAKE TWO TABLETS BY MOUTH EVERY NIGHT AT BEDTIME 180 tablet 1   No facility-administered medications prior to visit.    Allergies  Allergen Reactions   Effexor [Venlafaxine] Nausea Only    insomnia   Tramadol Nausea And Vomiting   Norco [Hydrocodone-Acetaminophen] Rash   Voltaren [Diclofenac] Rash    Review of Systems  Cardiovascular:  Positive for palpitations.      See HPI Objective:    Physical Exam Constitutional:      General: She is not in acute distress.    Appearance: Normal  appearance. She is well-developed.  HENT:     Head: Normocephalic and atraumatic.     Right Ear: External ear normal.     Left Ear: External ear normal.  Eyes:     General: No scleral icterus. Neck:  Thyroid: No thyromegaly.  Cardiovascular:     Rate and Rhythm: Normal rate and regular rhythm.     Heart sounds: Normal heart sounds. No murmur heard. Pulmonary:     Effort: Pulmonary effort is normal. No respiratory distress.     Breath sounds: Normal breath sounds. No wheezing.  Musculoskeletal:     Cervical back: Neck supple.  Skin:    General: Skin is warm and dry.  Neurological:     Mental Status: She is alert and oriented to person, place, and time.  Psychiatric:        Mood and Affect: Mood normal.        Behavior: Behavior normal.        Thought Content: Thought content normal.        Judgment: Judgment normal.     BP 101/75 (BP Location: Right Arm, Patient Position: Sitting, Cuff Size: Small)   Pulse 62   Temp 98.3 F (36.8 C) (Oral)   Resp 16   Wt 224 lb (101.6 kg)   SpO2 100%   BMI 33.08 kg/m  Wt Readings from Last 3 Encounters:  12/15/21 224 lb (101.6 kg)  12/11/21 225 lb (102.1 kg)  12/11/21 224 lb (101.6 kg)       Assessment & Plan:   Problem List Items Addressed This Visit       Unprioritized   Palpitations - Primary    New. EKG tracing is personally reviewed.  EKG notes NSR.  No acute changes.  Will obtain labs as ordered. Refer to cardiology for further evaluation which is likely to include cardiac monitor.  She is advised to go to the ER if severe/worsening symptoms.       Relevant Orders   EKG 12-Lead (Completed)   TSH   CBC with Differential/Platelet   Basic metabolic panel   Ambulatory referral to Cardiology    I am having Kim Pham. Mullaly maintain her SUMAtriptan, fluticasone, PARoxetine, cyclobenzaprine, pantoprazole, traZODone, gabapentin, meloxicam, and HYDROcodone-acetaminophen.  No orders of the defined types were placed  in this encounter.

## 2021-12-15 NOTE — Telephone Encounter (Signed)
Will the patient be scheduling a routine follow up visit?

## 2021-12-16 ENCOUNTER — Ambulatory Visit (INDEPENDENT_AMBULATORY_CARE_PROVIDER_SITE_OTHER): Payer: BC Managed Care – PPO | Admitting: Orthopaedic Surgery

## 2021-12-16 ENCOUNTER — Encounter: Payer: Self-pay | Admitting: Orthopaedic Surgery

## 2021-12-16 ENCOUNTER — Ambulatory Visit: Payer: BC Managed Care – PPO | Attending: Cardiology | Admitting: Cardiology

## 2021-12-16 ENCOUNTER — Ambulatory Visit: Payer: BC Managed Care – PPO | Attending: Cardiology

## 2021-12-16 ENCOUNTER — Encounter: Payer: Self-pay | Admitting: Cardiology

## 2021-12-16 VITALS — BP 100/66 | HR 61 | Ht 69.0 in | Wt 223.0 lb

## 2021-12-16 DIAGNOSIS — E66811 Obesity, class 1: Secondary | ICD-10-CM

## 2021-12-16 DIAGNOSIS — E669 Obesity, unspecified: Secondary | ICD-10-CM

## 2021-12-16 DIAGNOSIS — E782 Mixed hyperlipidemia: Secondary | ICD-10-CM

## 2021-12-16 DIAGNOSIS — I2510 Atherosclerotic heart disease of native coronary artery without angina pectoris: Secondary | ICD-10-CM

## 2021-12-16 DIAGNOSIS — N809 Endometriosis, unspecified: Secondary | ICD-10-CM | POA: Insufficient documentation

## 2021-12-16 DIAGNOSIS — M25562 Pain in left knee: Secondary | ICD-10-CM

## 2021-12-16 DIAGNOSIS — M2242 Chondromalacia patellae, left knee: Secondary | ICD-10-CM | POA: Diagnosis not present

## 2021-12-16 DIAGNOSIS — R011 Cardiac murmur, unspecified: Secondary | ICD-10-CM

## 2021-12-16 DIAGNOSIS — M25462 Effusion, left knee: Secondary | ICD-10-CM | POA: Insufficient documentation

## 2021-12-16 DIAGNOSIS — R002 Palpitations: Secondary | ICD-10-CM

## 2021-12-16 HISTORY — DX: Effusion, left knee: M25.462

## 2021-12-16 HISTORY — DX: Cardiac murmur, unspecified: R01.1

## 2021-12-16 HISTORY — DX: Obesity, unspecified: E66.9

## 2021-12-16 HISTORY — DX: Atherosclerotic heart disease of native coronary artery without angina pectoris: I25.10

## 2021-12-16 HISTORY — DX: Obesity, class 1: E66.811

## 2021-12-16 LAB — BASIC METABOLIC PANEL
BUN: 14 mg/dL (ref 6–23)
CO2: 27 mEq/L (ref 19–32)
Calcium: 9.4 mg/dL (ref 8.4–10.5)
Chloride: 103 mEq/L (ref 96–112)
Creatinine, Ser: 0.94 mg/dL (ref 0.40–1.20)
GFR: 75.29 mL/min (ref >60.00)
Glucose, Bld: 88 mg/dL (ref 70–99)
Potassium: 4.2 mEq/L (ref 3.5–5.1)
Sodium: 141 mEq/L (ref 135–145)

## 2021-12-16 LAB — CBC WITH DIFFERENTIAL/PLATELET
Basophils Absolute: 0.1 10*3/uL (ref 0.0–0.1)
Basophils Relative: 1.1 % (ref 0.0–3.0)
Eosinophils Absolute: 0.1 10*3/uL (ref 0.0–0.7)
Eosinophils Relative: 1.4 % (ref 0.0–5.0)
HCT: 38.2 % (ref 36.0–46.0)
Hemoglobin: 12.7 g/dL (ref 12.0–15.0)
Lymphocytes Relative: 33.7 % (ref 12.0–46.0)
Lymphs Abs: 2.2 10*3/uL (ref 0.7–4.0)
MCHC: 33.1 g/dL (ref 30.0–36.0)
MCV: 85.5 fl (ref 78.0–100.0)
Monocytes Absolute: 0.5 10*3/uL (ref 0.1–1.0)
Monocytes Relative: 7.1 % (ref 3.0–12.0)
Neutro Abs: 3.7 10*3/uL (ref 1.4–7.7)
Neutrophils Relative %: 56.7 % (ref 43.0–77.0)
Platelets: 278 10*3/uL (ref 150.0–400.0)
RBC: 4.47 Mil/uL (ref 3.87–5.11)
RDW: 13.4 % (ref 11.5–15.5)
WBC: 6.6 10*3/uL (ref 4.0–10.5)

## 2021-12-16 LAB — TSH: TSH: 6.51 u[IU]/mL — ABNORMAL HIGH (ref 0.35–5.50)

## 2021-12-16 NOTE — Patient Instructions (Signed)
Medication Instructions:  Your physician recommends that you continue on your current medications as directed. Please refer to the Current Medication list given to you today.  *If you need a refill on your cardiac medications before your next appointment, please call your pharmacy*   Lab Work: None ordered If you have labs (blood work) drawn today and your tests are completely normal, you will receive your results only by: [place] (if you have MyChart) OR A paper copy in the mail If you have any lab test that is abnormal or we need to change your treatment, we will call you to review the results.   Testing/Procedures: Your physician has requested that you have an echocardiogram. Echocardiography is a painless test that uses sound waves to create images of your heart. It provides your doctor with information about the size and shape of your heart and how well your heart's chambers and valves are working. This procedure takes approximately one hour. There are no restrictions for this procedure.   WHY IS MY DOCTOR PRESCRIBING ZIO? The Zio system is proven and trusted by physicians to detect and diagnose irregular heart rhythms -- and has been prescribed to hundreds of thousands of patients.  The FDA has cleared the Zio system to monitor for many different kinds of irregular heart rhythms. In a study, physicians were able to reach a diagnosis 90% of the time with the Zio system1.  You can wear the Zio monitor -- a small, discreet, comfortable patch -- during your normal day-to-day activity, including while you sleep, shower, and exercise, while it records every single heartbeat for analysis.  1Barrett, P., et al. Comparison of 24 Hour Holter Monitoring Versus 14 Day Novel Adhesive Patch Electrocardiographic Monitoring. [place], 2014.  ZIO VS. HOLTER MONITORING The Zio monitor can be comfortably worn for up to 14 days. Holter monitors can be worn for 24 to 48  hours, limiting the time to record any irregular heart rhythms you may have. Zio is able to capture data for the 51% of patients who have their first symptom-triggered arrhythmia after 48 hours.1  LIVE WITHOUT RESTRICTIONS The Zio ambulatory cardiac monitor is a small, unobtrusive, and water-resistant patch--you might even forget you're wearing it. The Zio monitor records and stores every beat of your heart, whether you're sleeping, working out, or showering. Wear the monitor for 14 days, remove 11/29/21.  Follow-Up: At [facility], you and your health needs are our priority.  As part of our continuing mission to provide you with exceptional heart care, we have created designated Provider Care Teams.  These Care Teams include your primary Cardiologist (physician) and Advanced Practice Providers (APPs -  Physician Assistants and Nurse Practitioners) who all work together to provide you with the care you need, when you need it.  We recommend signing up for the patient portal called "MyChart".  Sign up information is provided on this After Visit Summary.  MyChart is used to connect with patients for Virtual Visits (Telemedicine).  Patients are able to view lab/test results, encounter notes, upcoming appointments, etc.  Non-urgent messages can be sent to your provider as well.   To learn more about what you can do with MyChart, go to [email or web address].    Your next appointment:   6 month(s)  The format for your next appointment:   In Person  Provider:   Jyl Heinz, MD   Other Instructions Echocardiogram An echocardiogram is a test that uses sound waves (ultrasound) to produce images of  the heart. Images from an echocardiogram can provide important information about: Heart size and shape. The size and thickness and movement of your heart's walls. Heart muscle function and strength. Heart valve function or if you have stenosis. Stenosis is when the heart valves are too narrow. If  blood is flowing backward through the heart valves (regurgitation). A tumor or infectious growth around the heart valves. Areas of heart muscle that are not working well because of poor blood flow or injury from a heart attack. Aneurysm detection. An aneurysm is a weak or damaged part of an artery wall. The wall bulges out from the normal force of blood pumping through the body. Tell a health care provider about: Any allergies you have. All medicines you are taking, including vitamins, herbs, eye drops, creams, and over-the-counter medicines. Any blood disorders you have. Any surgeries you have had. Any medical conditions you have. Whether you are pregnant or may be pregnant. What are the risks? Generally, this is a safe test. However, problems may occur, including an allergic reaction to dye (contrast) that may be used during the test. What happens before the test? No specific preparation is needed. You may eat and drink normally. What happens during the test? You will take off your clothes from the waist up and put on a hospital gown. Electrodes or electrocardiogram (ECG)patches may be placed on your chest. The electrodes or patches are then connected to a device that monitors your heart rate and rhythm. You will lie down on a table for an ultrasound exam. A gel will be applied to your chest to help sound waves pass through your skin. A handheld device, called a transducer, will be pressed against your chest and moved over your heart. The transducer produces sound waves that travel to your heart and bounce back (or "echo" back) to the transducer. These sound waves will be captured in real-time and changed into images of your heart that can be viewed on a video monitor. The images will be recorded on a computer and reviewed by your health care provider. You may be asked to change positions or hold your breath for a short time. This makes it easier to get different views or better views of your  heart. In some cases, you may receive contrast through an IV in one of your veins. This can improve the quality of the pictures from your heart. The procedure may vary among health care providers and hospitals.   What can I expect after the test? You may return to your normal, everyday life, including diet, activities, and medicines, unless your health care provider tells you not to do that. Follow these instructions at home: It is up to you to get the results of your test. Ask your health care provider, or the department that is doing the test, when your results will be ready. Keep all follow-up visits. This is important. Summary An echocardiogram is a test that uses sound waves (ultrasound) to produce images of the heart. Images from an echocardiogram can provide important information about the size and shape of your heart, heart muscle function, heart valve function, and other possible heart problems. You do not need to do anything to prepare before this test. You may eat and drink normally. After the echocardiogram is completed, you may return to your normal, everyday life, unless your health care provider tells you not to do that. This information is not intended to replace advice given to you by your health care provider. Make  sure you discuss any questions you have with your health care provider. Document Revised: 11/27/2019 Document Reviewed: 11/27/2019 Elsevier Patient Education  2021 [employer].

## 2021-12-16 NOTE — Progress Notes (Signed)
Cardiology Office Note:    Date:  12/16/2021   ID:  Kim Pham, DOB [birth year], MRN [id number]  PCP:  Kim Pham, Kim Pham  Cardiologist:  Kim Lindau, MD   Referring MD: Kim Pham, Kim Pham    ASSESSMENT:    1. Mixed hyperlipidemia   2. Cardiac murmur   3. Palpitations   4. Obesity (BMI 30.0-34.9)   5. Coronary artery calcification seen on CT scan    PLAN:    In order of problems listed above:  Coronary artery calcification: Secondary prevention stressed with the patient.  Importance of compliance with diet and medication stressed and she vocalized understanding.  She is advised to walk at least 30 minutes a day 5 days a week and she promises to do so. Mixed dyslipidemia: Markedly elevated lipids.  I explained to her the risks.  She will need lipid-lowering therapy.  We will check her LFTs by adding into blood work done yesterday and if they are fine I would initiate her on rosuvastatin 20 mg daily.  Diet emphasized and she promises to do better. Cardiac murmur: Echocardiogram will be done to assess murmur heard on auscultation. Palpitations: She has abnormal thyroid and Hashimoto's disease and this might be related to her thyroid issue.  I am not going to initiate a beta-blocker because of borderline blood pressure.  I will review the monitoring that we will do for 2 weeks and decide on treatment.  At any rate thyroid focused management is the priority here. Obesity: Weight reduction stressed and diet was emphasized and she promises to do better. Patient will be seen in follow-up appointment in 6 months or earlier if the patient has any concerns    Medication Adjustments/Labs and Tests Ordered: Current medicines are reviewed at length with the patient today.  Concerns regarding medicines are outlined above.  No orders of the defined types were placed in this encounter.  No orders of the defined types were placed in this encounter.    History of Present  Illness:    Kim Pham is a 41 y.o. female who is being seen today for the evaluation of palpitations at the request of Kim Pham, Kim Pham.  Patient is a pleasant 41 year old female.  She has past medical history of mixed dyslipidemia and coronary artery calcifications seen on CT scan done several months ago.  She mentions to me that she has palpitations almost every other day and there is no dizziness or syncope.  That is the reason he is she is here for evaluation.  She walks on a regular basis.  She walks her dogs and with this she has no symptoms again.  At the time of my evaluation, the patient is alert awake oriented and in no distress.  She recently has been told to have Hashimoto's thyroiditis.  Past Medical History:  Diagnosis Date   Adnexal mass 02/24/2021   in epic seen on [place]   Amenorrhea 08/14/2021   Anxiety 08/14/2021   Arthralgia 08/18/2016   Chondromalacia, patella 06/30/2021   Chronic constipation 08/14/2021   Chronic headaches HISTORY OF MIGRAINES 08/14/2021   Chronic migraine without aura, with intractable migraine, so stated, with status migrainosus 5/85/2778   Complication of anesthesia 08/14/2021   PONV   Cyst of left ovary 06/17/2021   Degenerative disc disease, lumbar 09/04/2019   Depression with anxiety 03/26/2016   Diarrhea 07/08/2017   Effusion, left knee 12/16/2021   Endometriosis    Fatigue 03/26/2016   Gastroesophageal reflux disease 08/19/2020  Hair loss 11/22/2016   High risk medication use 11/22/2016   history of Chiari malformation    History of Chiari malformation 11/22/2016   history of COVID 02/2021   LOW GRADE FEVER CONGESTION AND HEADACHE ALL SYMPTOMS RESOLVED   Hot flashes 06/11/2021   Hyperlipidemia    Insomnia 08/19/2020   Low blood pressure    pt states history of low blood pressure-fainted 12/2010-instructed to move slowly   Nonepileptic episode ([place]) 12/22/2016   Numbness and tingling 02/25/2016   OSTEOARTHRITIS 08/14/2021   Other epilepsy,  intractable, without status epilepticus ([place]) 11/01/2016   Pain in right knee 11/13/2020   Photosensitivity 11/22/2016   Psychogenic nonepileptic seizure 07/27/2017   Raynaud's disease without gangrene 11/22/2016   Rectal bleeding 06/11/2021   Right lower quadrant abdominal pain 06/05/2021   Sacroiliac joint dysfunction of right side 12/19/2019   Sicca syndrome ([place]) 11/22/2016   SLE (systemic lupus erythematosus) ([place]) 03/26/2016   Transient alteration of awareness 01/01/2016   Wears glasses for reading 08/14/2021    Past Surgical History:  Procedure Laterality Date   ABDOMINAL HYSTERECTOMY  2007   CYSTOSCOPY  2007   WITH HYSTERECTOMY   DIAGNOSTIC LAPAROSCOPY  2005   OOPHORECTOMY Left 08/18/2021   ROTATOR CUFF REPAIR Right 2010   SALPINGOOPHORECTOMY  04/16/2011   Procedure: SALPINGO OOPHERECTOMY;  Surgeon: Lovenia Kim, MD;  Location: [facility];  Service: Gynecology;  Laterality: Right;   skull decompression surgery 2005     @ [facility]    Current Medications: Current Meds  Medication Sig   cyclobenzaprine (FLEXERIL) 5 MG tablet 1-2 tabs by mouth at bedtime as needed for back pain.   fluticasone (FLONASE) 50 MCG/ACT nasal spray Place 2 sprays into both nostrils daily as needed for allergies or rhinitis.   gabapentin (NEURONTIN) 300 MG capsule TAKE ONE CAPSULE BY MOUTH AT BEDTIME   HYDROcodone-acetaminophen (NORCO/VICODIN) 5-325 MG tablet Take 1 tablet by mouth every 4 (four) hours as needed for moderate pain.   meloxicam (MOBIC) 15 MG tablet Take 1 tablet (15 mg total) by mouth daily.   pantoprazole (PROTONIX) 40 MG tablet Take 1 tablet (40 mg total) by mouth daily.   PARoxetine (PAXIL) 20 MG tablet Take 1 tablet (20 mg total) by mouth at bedtime.   SUMAtriptan (IMITREX) 100 MG tablet Take 1 tablet (100 mg total) by mouth at onset of migraine. May repeat in 2 hours if headache persists or recurs. Do not take more than 2 doses in 24 hours.   traZODone (DESYREL) 100 MG tablet TAKE TWO  TABLETS BY MOUTH EVERY NIGHT AT BEDTIME     Allergies:   Effexor [venlafaxine], Tramadol, Norco [hydrocodone-acetaminophen], and Voltaren [diclofenac]   Social History   Socioeconomic History   Marital status: Married    Spouse name: Not on file   Number of children: 1   Years of education: Assoc   Highest education level: Not on file  Occupational History   Occupation: nurse  Tobacco Use   Smoking status: Former    Packs/day: 1.00    Years: 10.00    Total pack years: 10.00    Types: Cigarettes    Quit date: 04/04/2004    Years since quitting: 17.7    Passive exposure: Past   Smokeless tobacco: Never  Vaping Use   Vaping Use: Never used  Substance and Sexual Activity   Alcohol use: Not Currently   Drug use: Never   Sexual activity: Yes    Birth control/protection: Surgical  Other  Topics Concern   Not on file  Social History Narrative   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   Married   1 son- born 2004 has autism   Enjoys exercise, Surveyor, mining with son   Drinks 4-5 cups of coffee a day       Right handed   Social Determinants of Health   Financial Resource Strain: Not on file  Food Insecurity: Not on file  Transportation Needs: Not on file  Physical Activity: Not on file  Stress: Not on file  Social Connections: Not on file     Family History: The patient's family history includes ADD / ADHD in her son; Autism in her son; Breast cancer in her maternal aunt and mother; CAD in her father, mother, and paternal grandmother; CVA in her father and paternal grandfather; Celiac disease in her sister; Colon polyps in her father; Crohn's disease in her mother; Dementia in her maternal grandmother; Gout in her mother; Heart disease in her father and mother; Hypertension in her father and mother; Liver disease in her father; Macular degeneration in her paternal grandmother; Migraines in her mother; Pancreatic cancer in her maternal grandfather; Rheum arthritis in her maternal  grandmother; Sjogren's syndrome in her sister. There is no history of Esophageal cancer, Colon cancer, or Stomach cancer.  ROS:   Please see the history of present illness.    All other systems reviewed and are negative.  EKGs/Labs/Other Studies Reviewed:    The following studies were reviewed today: EKG reveals sinus rhythm and nonspecific ST-T changes   Recent Labs: 10/07/2021: ALT 29 12/15/2021: BUN 14; Creatinine, Ser 0.94; Hemoglobin 12.7; Platelets 278.0; Potassium 4.2; Sodium 141; TSH 6.51  Recent Lipid Panel    Component Value Date/Time   CHOL 273 (H) 09/08/2021 1045   TRIG 87.0 09/08/2021 1045   HDL 79.90 09/08/2021 1045   CHOLHDL 3 09/08/2021 1045   VLDL 17.4 09/08/2021 1045   LDLCALC 176 (H) 09/08/2021 1045    Physical Exam:    VS:  BP 100/66   Pulse 61   Ht '5\' 9"'$  (1.753 m)   Wt 223 lb (101.2 kg)   SpO2 97%   BMI 32.93 kg/m     Wt Readings from Last 3 Encounters:  12/16/21 223 lb (101.2 kg)  12/15/21 224 lb (101.6 kg)  12/11/21 225 lb (102.1 kg)     GEN: Patient is in no acute distress HEENT: Normal NECK: No JVD; No carotid bruits LYMPHATICS: No lymphadenopathy CARDIAC: S1 S2 regular, 2/6 systolic murmur at the apex. RESPIRATORY:  Clear to auscultation without rales, wheezing or rhonchi  ABDOMEN: Soft, non-tender, non-distended MUSCULOSKELETAL:  No edema; No deformity  SKIN: Warm and dry NEUROLOGIC:  Alert and oriented x 3 PSYCHIATRIC:  Normal affect    Signed, Kim Lindau, MD  12/16/2021 4:09 PM    [facility]

## 2021-12-16 NOTE — Progress Notes (Signed)
Office Visit Note   Patient: Kim Pham           Date of Birth: [birth year]           MRN: [id number] Visit Date: 12/16/2021              Requested by: Debbrah Alar, NP [address] [place],  [place] [address] PCP: Debbrah Alar, NP  Chief Complaint  Patient presents with   Left Knee - Pain      HPI: [name] returns today for a 1 week follow-up on her left knee.  She is a little over a week status post having a futon bed fall onto the front of her knee when she was helping move her son's college furniture up some stairs.  At her last visit 60 cc of serous clear fluid was drawn from her knee and she was injected with 80 mg of Depo-Medrol.  She does say it is feeling better but she still having quite a few mechanical symptoms of catching and popping.  She has a history of similar issues on the right knee and had an arthroscopy last year and is significantly improved.  Her pain is anterior over the patellofemoral joint. Right knee arthroscopy was performed a year ago for mechanical symptoms.  There were areas of chondromalacia on both the patella and the trochlear and a large thickened platelet which was resected.  She notes her knee is much better  Assessment & Plan: Visit Diagnoses:  1. Acute pain of left knee   2. Effusion, left knee   3. Chondromalacia of left patella     Plan: Patient with 10-day history of effusion after injury to her left knee.  She is mildly better but still has significant difficulties especially in the anterior aspect of her knee.  Her x-rays show that her joint is fairly well-preserved and no acute osseous injuries.  She does have a history of arthroscopy for similar symptoms on the right knee and did quite well.  Given her mechanical symptoms and not significant improvement we will order an MRI to be done as soon as possible.  She may very well need a left knee arthroscopy and debridement which if appropriate she would like  to do in the near future  Follow-Up Instructions: Return After MRI scan left knee.   Ortho Exam  Patient is alert, oriented, no adenopathy, well-dressed, normal affect, normal respiratory effort. Left knee no effusion today knee is not warm or red.  She does have significant crepitus over the patellofemoral joint with range of motion.  Also some catching feelings perceived by her.  Maybe slight tenderness over the lateral joint line none over the medial joint line.  Imaging: No results found. No images are attached to the encounter.  Labs: Lab Results  Component Value Date   ESRSEDRATE 6 07/05/2017   ESRSEDRATE 4 08/23/2016   ESRSEDRATE 2 03/08/2016   CRP <1.0 10/07/2021     Lab Results  Component Value Date   ALBUMIN 4.4 10/07/2021   ALBUMIN 4.5 09/08/2021   ALBUMIN 4.4 06/05/2021    No results found for: "MG" Lab Results  Component Value Date   VD25OH 41 09/29/2021   VD25OH 25 (L) 07/05/2017   VD25OH 28.64 (L) 11/21/2014    No results found for: "PREALBUMIN"    Latest Ref Rng & Units 10/07/2021    9:42 AM 09/08/2021   10:45 AM 06/05/2021    4:37 PM  CBC  EXTENDED  WBC 4.0 - 10.5 K/uL 3.5  4.8  7.1   RBC 3.87 - 5.11 Mil/uL 4.47  4.51  4.67   Hemoglobin 12.0 - 15.0 g/dL 12.8  12.8  13.3   HCT 36.0 - 46.0 % 38.8  39.3  40.4   Platelets 150.0 - 400.0 K/uL 267.0  325.0  324   NEUT# 1.4 - 7.7 K/uL 1.9  2.2  4.3   Lymph# 0.7 - 4.0 K/uL 1.2  2.1  2.1      There is no height or weight on file to calculate BMI.  Orders:  Orders Placed This Encounter  Procedures   MR Knee Left w/o contrast   No orders of the defined types were placed in this encounter.    Procedures: No procedures performed  Clinical Data: No additional findings.  ROS:  All other systems negative, except as noted in the HPI. Review of Systems  Objective: Vital Signs: There were no vitals taken for this visit.  Specialty Comments:  No specialty comments available.  PMFS  History: Patient Active Problem List   Diagnosis Date Noted   Effusion, left knee 12/16/2021   Palpitations 12/15/2021   Preventative health care 09/08/2021   Blood in stool 09/08/2021   Hyperlipidemia 09/08/2021   Back pain 09/04/2021   Pelvic pain in female 08/18/2021   Chondromalacia, patella 06/30/2021   Family history of BRCA gene mutation 06/17/2021   Cyst of left ovary 06/17/2021   Vasomotor symptoms due to menopause 06/17/2021   Rectal bleeding 06/11/2021   Adnexal mass 06/11/2021   Hot flashes 06/11/2021   Constipation 06/11/2021   Right lower quadrant abdominal pain 06/05/2021   Pain in right knee 11/13/2020   Insomnia 08/19/2020   Gastroesophageal reflux disease 08/19/2020   Psychogenic nonepileptic seizure 07/27/2017   Chronic migraine without aura, with intractable migraine, so stated, with status migrainosus 07/27/2017   Diarrhea 07/08/2017   Nonepileptic episode ([place]) 12/22/2016   Sicca syndrome ([place]) 11/22/2016   Raynaud's disease without gangrene 11/22/2016   Hair loss 11/22/2016   Photosensitivity 11/22/2016   History of Chiari malformation 11/22/2016   High risk medication use 11/22/2016   Arthralgia 08/18/2016   Fatigue 03/26/2016   SLE (systemic lupus erythematosus) ([place]) 03/26/2016   Depression with anxiety 03/26/2016   Numbness and tingling 02/25/2016   Transient alteration of awareness 01/01/2016   Chiari malformation 12/19/2015   Past Medical History:  Diagnosis Date   Adnexal mass 02/24/2021   in epic seen on [place]   Amenorrhea 08/14/2021   Anxiety 08/14/2021   Chronic constipation 08/14/2021   Chronic headaches HISTORY OF MIGRAINES 60/01/9322   Complication of anesthesia 08/14/2021   PONV   Depression    Endometriosis    GERD (gastroesophageal reflux disease)    tums otc, prevacid prn   history of Chiari malformation    history of COVID 02/2021   LOW GRADE FEVER CONGESTION AND HEADACHE ALL SYMPTOMS RESOLVED   HISTORY OF MIGRAINES  08/14/2021   Hyperlipidemia    Insomnia 08/19/2020   Low blood pressure    pt states history of low blood pressure-fainted 12/2010-instructed to move slowly   OSTEOARTHRITIS 08/14/2021   SLE (systemic lupus erythematosus) ([place]) 03/26/2016   Wears glasses for reading 08/14/2021    Family History  Problem Relation Age of Onset   Hypertension Mother    CAD Mother    Breast cancer Mother    Migraines Mother    Gout Mother    Heart disease Mother  Crohn's disease Mother    Hypertension Father    CAD Father    CVA Father    Heart disease Father    Colon polyps Father    Liver disease Father    Sjogren's syndrome Sister    Celiac disease Sister    Rheum arthritis Maternal Grandmother    Dementia Maternal Grandmother    Pancreatic cancer Maternal Grandfather    CAD Paternal Grandmother    Macular degeneration Paternal Grandmother    CVA Paternal Grandfather    Autism Son    ADD / ADHD Son    Breast cancer Maternal Aunt    Esophageal cancer Neg Hx    Colon cancer Neg Hx    Stomach cancer Neg Hx     Past Surgical History:  Procedure Laterality Date   ABDOMINAL HYSTERECTOMY  2007   CYSTOSCOPY  2007   WITH HYSTERECTOMY   DIAGNOSTIC LAPAROSCOPY  2005   OOPHORECTOMY Left 08/18/2021   ROTATOR CUFF REPAIR Right 2010   SALPINGOOPHORECTOMY  04/16/2011   Procedure: SALPINGO OOPHERECTOMY;  Surgeon: Lovenia Kim, MD;  Location: [facility];  Service: Gynecology;  Laterality: Right;   skull decompression surgery 2005     @ [facility] History   Occupational History   Occupation: nurse  Tobacco Use   Smoking status: Former    Packs/day: 1.00    Years: 10.00    Total pack years: 10.00    Types: Cigarettes    Quit date: 04/04/2004    Years since quitting: 17.7    Passive exposure: Past   Smokeless tobacco: Never  Vaping Use   Vaping Use: Never used  Substance and Sexual Activity   Alcohol use: Not Currently   Drug use: Never   Sexual activity: Yes    Birth  control/protection: Surgical

## 2021-12-16 NOTE — Telephone Encounter (Signed)
Ok to follow up PRN

## 2021-12-17 ENCOUNTER — Encounter: Payer: Self-pay | Admitting: Family

## 2021-12-17 ENCOUNTER — Other Ambulatory Visit (INDEPENDENT_AMBULATORY_CARE_PROVIDER_SITE_OTHER): Payer: BC Managed Care – PPO

## 2021-12-17 ENCOUNTER — Telehealth: Payer: Self-pay | Admitting: Family

## 2021-12-17 ENCOUNTER — Ambulatory Visit
Admission: RE | Admit: 2021-12-17 | Discharge: 2021-12-17 | Disposition: A | Discharge status: Home / Self Care | Payer: BC Managed Care – PPO | Source: Ambulatory Visit | Attending: Physician Assistant | Admitting: Physician Assistant

## 2021-12-17 ENCOUNTER — Other Ambulatory Visit: Payer: Self-pay | Admitting: *Deleted

## 2021-12-17 ENCOUNTER — Ambulatory Visit: Payer: BC Managed Care – PPO | Admitting: Family

## 2021-12-17 DIAGNOSIS — M25562 Pain in left knee: Secondary | ICD-10-CM | POA: Diagnosis not present

## 2021-12-17 DIAGNOSIS — R002 Palpitations: Secondary | ICD-10-CM | POA: Diagnosis not present

## 2021-12-17 DIAGNOSIS — E039 Hypothyroidism, unspecified: Secondary | ICD-10-CM | POA: Diagnosis not present

## 2021-12-17 LAB — T4, FREE: Free T4: 0.8 ng/dL (ref 0.60–1.60)

## 2021-12-17 LAB — T3, FREE: T3, Free: 2.7 pg/mL (ref 2.3–4.2)

## 2021-12-17 NOTE — Telephone Encounter (Signed)
Can you please ask the lab to add on LFT's to sample? Dx hyperlipidemia? tks

## 2021-12-17 NOTE — Telephone Encounter (Signed)
Opened in error

## 2021-12-18 ENCOUNTER — Encounter: Payer: Self-pay | Admitting: Family

## 2021-12-18 ENCOUNTER — Other Ambulatory Visit: Payer: BC Managed Care – PPO

## 2021-12-18 ENCOUNTER — Other Ambulatory Visit: Payer: Self-pay | Admitting: *Deleted

## 2021-12-18 ENCOUNTER — Encounter: Payer: Self-pay | Admitting: Orthopaedic Surgery

## 2021-12-18 DIAGNOSIS — E038 Other specified hypothyroidism: Secondary | ICD-10-CM | POA: Insufficient documentation

## 2021-12-18 DIAGNOSIS — E782 Mixed hyperlipidemia: Secondary | ICD-10-CM

## 2021-12-18 NOTE — Telephone Encounter (Signed)
Faxed add on request to [facility] to send to [facility] for add on. We are really close to the cut off time for add on so will see if it can be added.

## 2021-12-18 NOTE — Telephone Encounter (Signed)
Dear [name],    Your TSH is elevated but your T3/T4 are normal.  At this point I don't think you need any thyroid medication, but we should plan to repeat these tests in about 3 months. Please schedule a follow up appointment at that time.   Melissa  Written by Debbrah Alar, NP on 12/18/2021  8:37 AM EDT Seen by patient Kim Pham on 12/18/2021  8:38 AM

## 2021-12-19 LAB — HEPATIC FUNCTION PANEL
AG Ratio: 1.8 (calc) (ref 1.0–2.5)
ALT: 22 U/L (ref 6–29)
AST: 18 U/L (ref 10–30)
Albumin: 4.4 g/dL (ref 3.6–5.1)
Alkaline phosphatase (APISO): 71 U/L (ref 31–125)
Bilirubin, Direct: 0 mg/dL (ref 0.0–0.2)
Globulin: 2.5 g/dL (calc) (ref 1.9–3.7)
Indirect Bilirubin: 0.2 mg/dL (calc) (ref 0.2–1.2)
Total Bilirubin: 0.2 mg/dL (ref 0.2–1.2)
Total Protein: 6.9 g/dL (ref 6.1–8.1)

## 2021-12-22 ENCOUNTER — Ambulatory Visit (INDEPENDENT_AMBULATORY_CARE_PROVIDER_SITE_OTHER): Payer: BC Managed Care – PPO | Admitting: Orthopaedic Surgery

## 2021-12-22 ENCOUNTER — Ambulatory Visit (HOSPITAL_BASED_OUTPATIENT_CLINIC_OR_DEPARTMENT_OTHER)
Admission: RE | Admit: 2021-12-22 | Discharge: 2021-12-22 | Disposition: A | Discharge status: Home / Self Care | Payer: BC Managed Care – PPO | Source: Ambulatory Visit | Attending: Cardiology | Admitting: Cardiology

## 2021-12-22 ENCOUNTER — Encounter: Payer: Self-pay | Admitting: Orthopaedic Surgery

## 2021-12-22 DIAGNOSIS — I2510 Atherosclerotic heart disease of native coronary artery without angina pectoris: Secondary | ICD-10-CM

## 2021-12-22 DIAGNOSIS — R011 Cardiac murmur, unspecified: Secondary | ICD-10-CM | POA: Diagnosis not present

## 2021-12-22 DIAGNOSIS — M2242 Chondromalacia patellae, left knee: Secondary | ICD-10-CM

## 2021-12-22 LAB — ECHOCARDIOGRAM COMPLETE
AR max vel: 2.95 cm2
AV Area VTI: 3.14 cm2
AV Area mean vel: 2.89 cm2
AV Mean grad: 3 mmHg
AV Peak grad: 5.4 mmHg
Ao pk vel: 1.16 m/s
Area-P 1/2: 3.65 cm2
S' Lateral: 3.2 cm

## 2021-12-22 NOTE — Progress Notes (Signed)
  Echocardiogram 2D Echocardiogram has been performed.  Kim, Pham F 12/22/2021, 10:31 AM

## 2021-12-22 NOTE — Progress Notes (Signed)
Office Visit Note   Patient: Kim Pham           Date of Birth: 1980-10-22           MRN: [id number] Visit Date: 12/22/2021              Requested by: Debbrah Alar, NP [address] [place],  [place] [address] PCP: Debbrah Alar, NP   Assessment & Plan: Visit Diagnoses:  1. Chondromalacia of left patella     Plan: [name] had an MRI scan of her left knee demonstrated chondromalacia of the patellofemoral joint ends, specifically, the trochlear where there is some areas of subchondral edema and full-thickness cartilage loss.  She is experiencing clinical symptoms and catching.  She had similar findings in her right knee and is status post arthroscopy several years ago and is doing well.  She would like to proceed with arthroscopy.  I discussed the surgery including arthroscopic portals, anesthesia and expected recovery.  She is fully aware that arthroscopy may not alleviate all of her symptoms but I think if the mechanical symptoms were to be alleviated she would be happy.  We will try and get this done as soon as possible  Follow-Up Instructions: Return We will schedule left knee arthroscopy.   Orders:  No orders of the defined types were placed in this encounter.  No orders of the defined types were placed in this encounter.     Procedures: No procedures performed   Clinical Data: No additional findings.   Subjective: Chief Complaint  Patient presents with   Left Knee - Follow-up   Patient presents today for the discussion of her MRI of the left knee. Patient states that she still has been having aching and throbbing pains. She also mentions that her left knee is continuing to lock and show signs that it wants to give away. Today patient would like to discuss results and treatment plans.   Review of Systems   Objective: Vital Signs: There were no vitals taken for this visit.  Physical Exam Constitutional:      Appearance: She is  well-developed.  Eyes:     Pupils: Pupils are equal, round, and reactive to light.  Pulmonary:     Effort: Pulmonary effort is normal.  Skin:    General: Skin is warm and dry.  Neurological:     Mental Status: She is alert and oriented to person, place, and time.  Psychiatric:        Behavior: Behavior normal.     Ortho Exam awake alert and oriented x3.  Comfortable sitting.  No use of ambulatory aid.  Positive crepitation left knee with patella motion and some pain with patella compression.  No medial or lateral joint pain.  No effusion.  No evidence of instability.  Full extension of flexed over 105 degrees.  Neurologically intact  Specialty Comments:  No specialty comments available.  Imaging: ECHOCARDIOGRAM COMPLETE  Result Date: 12/22/2021    ECHOCARDIOGRAM REPORT   Patient Name:   Kim Pham Date of Exam: 12/22/2021 Medical Rec #:  [id number]        Height:       69.0 in Accession #:    [id number]       Weight:       223.0 lb Date of Birth:  08-03-1980       BSA:          2.164 m Patient Age:    41 years  BP:           99/65 mmHg Patient Gender: F                HR:           60 bpm. Exam Location:  [facility] Procedure: 2D Echo, Cardiac Doppler, Color Doppler and 3D Echo Indications:    Murmur  History:        Patient has prior history of Echocardiogram examinations, most                 recent 08/25/2016.  Sonographer:    Merrie Roof RDCS Referring Phys: [place]  1. Left ventricular ejection fraction, by estimation, is 55 to 60%. The left ventricle has normal function. The left ventricle has no regional wall motion abnormalities. Left ventricular diastolic parameters were normal.  2. Right ventricular systolic function is normal. The right ventricular size is normal. There is normal pulmonary artery systolic pressure.  3. The mitral valve is normal in structure. Trivial mitral valve regurgitation. No evidence of mitral stenosis.  4. The aortic valve is  normal in structure. Aortic valve regurgitation is not visualized. No aortic stenosis is present.  5. The inferior vena cava is normal in size with greater than 50% respiratory variability, suggesting right atrial pressure of 3 mmHg. FINDINGS  Left Ventricle: Left ventricular ejection fraction, by estimation, is 55 to 60%. The left ventricle has normal function. The left ventricle has no regional wall motion abnormalities. The left ventricular internal cavity size was normal in size. There is  no left ventricular hypertrophy. Left ventricular diastolic parameters were normal. Right Ventricle: The right ventricular size is normal. No increase in right ventricular wall thickness. Right ventricular systolic function is normal. There is normal pulmonary artery systolic pressure. The tricuspid regurgitant velocity is 2.43 m/s, and  with an assumed right atrial pressure of 3 mmHg, the estimated right ventricular systolic pressure is 40.1 mmHg. Left Atrium: Left atrial size was normal in size. Right Atrium: Right atrial size was normal in size. Pericardium: There is no evidence of pericardial effusion. Mitral Valve: The mitral valve is normal in structure. Trivial mitral valve regurgitation. No evidence of mitral valve stenosis. Tricuspid Valve: The tricuspid valve is normal in structure. Tricuspid valve regurgitation is trivial. No evidence of tricuspid stenosis. Aortic Valve: The aortic valve is normal in structure. Aortic valve regurgitation is not visualized. No aortic stenosis is present. Aortic valve mean gradient measures 3.0 mmHg. Aortic valve peak gradient measures 5.4 mmHg. Aortic valve area, by VTI measures 3.14 cm. Pulmonic Valve: The pulmonic valve was normal in structure. Pulmonic valve regurgitation is not visualized. No evidence of pulmonic stenosis. Aorta: The aortic root is normal in size and structure. Venous: The inferior vena cava is normal in size with greater than 50% respiratory variability,  suggesting right atrial pressure of 3 mmHg. IAS/Shunts: No atrial level shunt detected by color flow Doppler.  LEFT VENTRICLE PLAX 2D LVIDd:         5.10 cm   Diastology LVIDs:         3.20 cm   LV e' medial:    9.68 cm/s LV PW:         1.00 cm   LV E/e' medial:  7.7 LV IVS:        0.90 cm   LV e' lateral:   11.40 cm/s LVOT diam:     2.20 cm   LV E/e' lateral: 6.6 LV  SV:         79 LV SV Index:   37 LVOT Area:     3.80 cm                           3D Volume EF:                          3D EF:        64 %                          LV EDV:       98 ml                          LV ESV:       36 ml                          LV SV:        62 ml RIGHT VENTRICLE RV Basal diam:  3.40 cm RV S prime:     11.90 cm/s TAPSE (M-mode): 3.7 cm LEFT ATRIUM             Index        RIGHT ATRIUM           Index LA diam:        4.00 cm 1.85 cm/m   RA Area:     18.00 cm LA Vol (A2C):   56.0 ml 25.88 ml/m  RA Volume:   51.80 ml  23.94 ml/m LA Vol (A4C):   58.8 ml 27.17 ml/m LA Biplane Vol: 58.0 ml 26.80 ml/m  AORTIC VALVE AV Area (Vmax):    2.95 cm AV Area (Vmean):   2.89 cm AV Area (VTI):     3.14 cm AV Vmax:           116.00 cm/s AV Vmean:          87.700 cm/s AV VTI:            0.252 m AV Peak Grad:      5.4 mmHg AV Mean Grad:      3.0 mmHg LVOT Vmax:         90.10 cm/s LVOT Vmean:        66.600 cm/s LVOT VTI:          0.208 m LVOT/AV VTI ratio: 0.83  AORTA Ao Root diam: 3.50 cm Ao Asc diam:  3.00 cm MITRAL VALVE               TRICUSPID VALVE MV Area (PHT): 3.65 cm    TR Peak grad:   23.6 mmHg MV Decel Time: 208 msec    TR Vmax:        243.00 cm/s MV E velocity: 75.00 cm/s MV A velocity: 56.10 cm/s  SHUNTS MV E/A ratio:  1.34        Systemic VTI:  0.21 m                            Systemic Diam: 2.20 cm Jenne Campus MD Electronically signed by Jenne Campus MD Signature Date/Time: 12/22/2021/12:51:38 PM    Final      PMFS History: Patient Active Problem List   Diagnosis Date Noted   Subclinical hypothyroidism  12/18/2021   Effusion, left knee 12/16/2021   Endometriosis 12/16/2021   Cardiac murmur 12/16/2021   Obesity (BMI 30.0-34.9) 12/16/2021   Coronary artery calcification seen on CT scan 12/16/2021   Palpitations 12/15/2021   Preventative health care 09/08/2021   Blood in stool 09/08/2021   Hyperlipidemia 09/08/2021   Back pain 09/04/2021   Pelvic pain in female 08/18/2021   Amenorrhea 08/14/2021   Anxiety 08/14/2021   Chronic constipation 08/14/2021   Chronic headaches HISTORY OF MIGRAINES 83/66/2947   Complication of anesthesia 08/14/2021   HISTORY OF MIGRAINES 08/14/2021   OSTEOARTHRITIS 08/14/2021   Wears glasses for reading 08/14/2021   Chondromalacia, patella 06/30/2021   Family history of BRCA gene mutation 06/17/2021   Cyst of left ovary 06/17/2021   Vasomotor symptoms due to menopause 06/17/2021   Rectal bleeding 06/11/2021   Adnexal mass 06/11/2021   Hot flashes 06/11/2021   Constipation 06/11/2021   history of COVID 02/2021   Pain in right knee 11/13/2020   Insomnia 08/19/2020   Gastroesophageal reflux disease 08/19/2020   Sacroiliac joint dysfunction of right side 12/19/2019   Degenerative disc disease, lumbar 09/04/2019   Psychogenic nonepileptic seizure 07/27/2017   Chronic migraine without aura, with intractable migraine, so stated, with status migrainosus 07/27/2017   Diarrhea 07/08/2017   Nonepileptic episode ([place]) 12/22/2016   Sicca syndrome ([place]) 11/22/2016   Raynaud's disease without gangrene 11/22/2016   Hair loss 11/22/2016   Photosensitivity 11/22/2016   History of Chiari malformation 11/22/2016   High risk medication use 11/22/2016   Other epilepsy, intractable, without status epilepticus ([place]) 11/01/2016   Arthralgia 08/18/2016   Pain in unspecified joint 08/18/2016   Fatigue 03/26/2016   SLE (systemic lupus erythematosus) ([place]) 03/26/2016   Depression with anxiety 03/26/2016   Numbness and tingling 02/25/2016   Transient alteration of  awareness 01/01/2016   Chiari malformation 12/19/2015   Family history of breast cancer 09/09/2015   Past Medical History:  Diagnosis Date   Adnexal mass 02/24/2021   in epic seen on [place]   Amenorrhea 08/14/2021   Anxiety 08/14/2021   Arthralgia 08/18/2016   Chondromalacia, patella 06/30/2021   Chronic constipation 08/14/2021   Chronic headaches HISTORY OF MIGRAINES 08/14/2021   Chronic migraine without aura, with intractable migraine, so stated, with status migrainosus 65/46/5035   Complication of anesthesia 08/14/2021   PONV   Cyst of left ovary 06/17/2021   Degenerative disc disease, lumbar 09/04/2019   Depression with anxiety 03/26/2016   Diarrhea 07/08/2017   Effusion, left knee 12/16/2021   Endometriosis    Fatigue 03/26/2016   Gastroesophageal reflux disease 08/19/2020   Hair loss 11/22/2016   High risk medication use 11/22/2016   history of Chiari malformation    History of Chiari malformation 11/22/2016   history of COVID 02/2021   LOW GRADE FEVER CONGESTION AND HEADACHE ALL SYMPTOMS RESOLVED   Hot flashes 06/11/2021   Hyperlipidemia    Insomnia 08/19/2020   Low blood pressure    pt states history of low blood pressure-fainted 12/2010-instructed to move slowly   Nonepileptic episode ([place]) 12/22/2016   Numbness and tingling 02/25/2016   OSTEOARTHRITIS 08/14/2021   Other epilepsy, intractable, without status epilepticus ([place]) 11/01/2016   Pain in right knee 11/13/2020   Photosensitivity 11/22/2016   Psychogenic nonepileptic seizure 07/27/2017   Raynaud's disease without gangrene 11/22/2016   Rectal bleeding 06/11/2021   Right lower quadrant abdominal pain 06/05/2021   Sacroiliac joint dysfunction of right side 12/19/2019   Sicca syndrome ([place]) 11/22/2016  SLE (systemic lupus erythematosus) ([place]) 03/26/2016   Subclinical hypothyroidism    Transient alteration of awareness 01/01/2016   Wears glasses for reading 08/14/2021    Family History  Problem Relation  Age of Onset   Hypertension Mother    CAD Mother    Breast cancer Mother    Migraines Mother    Gout Mother    Heart disease Mother    Crohn's disease Mother    Hypertension Father    CAD Father    CVA Father    Heart disease Father    Colon polyps Father    Liver disease Father    Sjogren's syndrome Sister    Celiac disease Sister    Rheum arthritis Maternal Grandmother    Dementia Maternal Grandmother    Pancreatic cancer Maternal Grandfather    CAD Paternal Grandmother    Macular degeneration Paternal Grandmother    CVA Paternal Grandfather    Autism Son    ADD / ADHD Son    Breast cancer Maternal Aunt    Esophageal cancer Neg Hx    Colon cancer Neg Hx    Stomach cancer Neg Hx     Past Surgical History:  Procedure Laterality Date   ABDOMINAL HYSTERECTOMY  2007   CYSTOSCOPY  2007   WITH HYSTERECTOMY   DIAGNOSTIC LAPAROSCOPY  2005   OOPHORECTOMY Left 08/18/2021   ROTATOR CUFF REPAIR Right 2010   SALPINGOOPHORECTOMY  04/16/2011   Procedure: SALPINGO OOPHERECTOMY;  Surgeon: Lovenia Kim, MD;  Location: [facility];  Service: Gynecology;  Laterality: Right;   skull decompression surgery 2005     @ [place] History   Occupational History   Occupation: nurse  Tobacco Use   Smoking status: Former    Packs/day: 1.00    Years: 10.00    Total pack years: 10.00    Types: Cigarettes    Quit date: 04/04/2004    Years since quitting: 17.7    Passive exposure: Past   Smokeless tobacco: Never  Vaping Use   Vaping Use: Never used  Substance and Sexual Activity   Alcohol use: Not Currently   Drug use: Never   Sexual activity: Yes    Birth control/protection: Surgical

## 2021-12-24 ENCOUNTER — Other Ambulatory Visit: Payer: Self-pay | Admitting: Orthopaedic Surgery

## 2021-12-24 DIAGNOSIS — M2242 Chondromalacia patellae, left knee: Secondary | ICD-10-CM | POA: Diagnosis not present

## 2021-12-24 DIAGNOSIS — M948X6 Other specified disorders of cartilage, lower leg: Secondary | ICD-10-CM | POA: Diagnosis not present

## 2021-12-24 DIAGNOSIS — G8918 Other acute postprocedural pain: Secondary | ICD-10-CM | POA: Diagnosis not present

## 2021-12-24 DIAGNOSIS — M6752 Plica syndrome, left knee: Secondary | ICD-10-CM | POA: Diagnosis not present

## 2021-12-24 MED ORDER — OXYCODONE-ACETAMINOPHEN 5-325 MG PO TABS: 1.0000 | ORAL_TABLET | ORAL | 0 refills | Status: DC | PRN

## 2021-12-25 ENCOUNTER — Encounter: Payer: Self-pay | Admitting: Cardiology

## 2021-12-25 DIAGNOSIS — I441 Atrioventricular block, second degree: Secondary | ICD-10-CM

## 2021-12-28 MED ORDER — ROSUVASTATIN CALCIUM 20 MG PO TABS: 20.0000 mg | ORAL_TABLET | Freq: Every day | ORAL | 3 refills | Status: DC

## 2021-12-31 ENCOUNTER — Encounter: Payer: Self-pay | Admitting: Physician Assistant

## 2021-12-31 ENCOUNTER — Ambulatory Visit (INDEPENDENT_AMBULATORY_CARE_PROVIDER_SITE_OTHER): Payer: BC Managed Care – PPO | Admitting: Physician Assistant

## 2021-12-31 DIAGNOSIS — M25462 Effusion, left knee: Secondary | ICD-10-CM

## 2021-12-31 MED ORDER — HYDROCODONE-ACETAMINOPHEN 5-325 MG PO TABS: 1.0000 | ORAL_TABLET | ORAL | 0 refills | Status: DC | PRN

## 2021-12-31 NOTE — Progress Notes (Signed)
Office Visit Note   Patient: Kim Pham           Date of Birth: 1980/10/04           MRN: [id number] Visit Date: 12/31/2021              Requested by: Debbrah Alar, NP [address] [place],  [place] [address] PCP: Debbrah Alar, NP  Chief Complaint  Patient presents with   Left Knee - Routine Post Op      HPI: Patient is 1 week status post left knee arthroscopy with chondroplasty.  Overall she is doing well.  She has been using hydrocodone with Benadryl for pain control.  She did try the oxycodone but it made her quite nauseous.  She denies any fever, chills, or calf pain.  Assessment & Plan: Visit Diagnoses:  1. Effusion, left knee     Plan: 1 week status post left knee arthroscopy chondroplasty.  She will bear weight as polyp tolerated.  May ice her knee as needed.  I will give her a refill for the hydrocodone.  She will follow-up in 1 week for reevaluation.  Overall she feels that this recovery is similar to the one on her right side  Follow-Up Instructions: Return in about 1 week (around 01/07/2022).   Ortho Exam  Patient is alert, oriented, no adenopathy, well-dressed, normal affect, normal respiratory effort. Left knee minimal effusion.  No redness no cellulitis.  Well-healed surgical portals.  Calves are soft and nontender negative Bevelyn Buckles' sign distal sensation is intact ambulating fairly well  Imaging: No results found. No images are attached to the encounter.  Labs: Lab Results  Component Value Date   ESRSEDRATE 6 07/05/2017   ESRSEDRATE 4 08/23/2016   ESRSEDRATE 2 03/08/2016   CRP <1.0 10/07/2021     Lab Results  Component Value Date   ALBUMIN 4.4 10/07/2021   ALBUMIN 4.5 09/08/2021   ALBUMIN 4.4 06/05/2021    No results found for: "MG" Lab Results  Component Value Date   VD25OH 41 09/29/2021   VD25OH 25 (L) 07/05/2017   VD25OH 28.64 (L) 11/21/2014    No results found for: "PREALBUMIN"    Latest Ref Rng &  Units 12/15/2021    4:37 PM 10/07/2021    9:42 AM 09/08/2021   10:45 AM  CBC EXTENDED  WBC 4.0 - 10.5 K/uL 6.6  3.5  4.8   RBC 3.87 - 5.11 Mil/uL 4.47  4.47  4.51   Hemoglobin 12.0 - 15.0 g/dL 12.7  12.8  12.8   HCT 36.0 - 46.0 % 38.2  38.8  39.3   Platelets 150.0 - 400.0 K/uL 278.0  267.0  325.0   NEUT# 1.4 - 7.7 K/uL 3.7  1.9  2.2   Lymph# 0.7 - 4.0 K/uL 2.2  1.2  2.1      There is no height or weight on file to calculate BMI.  Orders:  No orders of the defined types were placed in this encounter.  Meds ordered this encounter  Medications   HYDROcodone-acetaminophen (NORCO/VICODIN) 5-325 MG tablet    Sig: Take 1 tablet by mouth every 4 (four) hours as needed for moderate pain.    Dispense:  20 tablet    Refill:  0     Procedures: No procedures performed  Clinical Data: No additional findings.  ROS:  All other systems negative, except as noted in the HPI. Review of Systems  Objective: Vital Signs: There were no  vitals taken for this visit.  Specialty Comments:  No specialty comments available.  PMFS History: Patient Active Problem List   Diagnosis Date Noted   Subclinical hypothyroidism 12/18/2021   Effusion, left knee 12/16/2021   Endometriosis 12/16/2021   Cardiac murmur 12/16/2021   Obesity (BMI 30.0-34.9) 12/16/2021   Coronary artery calcification seen on CT scan 12/16/2021   Palpitations 12/15/2021   Preventative health care 09/08/2021   Blood in stool 09/08/2021   Hyperlipidemia 09/08/2021   Back pain 09/04/2021   Pelvic pain in female 08/18/2021   Amenorrhea 08/14/2021   Anxiety 08/14/2021   Chronic constipation 08/14/2021   Chronic headaches HISTORY OF MIGRAINES 14/43/1540   Complication of anesthesia 08/14/2021   HISTORY OF MIGRAINES 08/14/2021   OSTEOARTHRITIS 08/14/2021   Wears glasses for reading 08/14/2021   Chondromalacia, patella 06/30/2021   Family history of BRCA gene mutation 06/17/2021   Cyst of left ovary 06/17/2021   Vasomotor  symptoms due to menopause 06/17/2021   Rectal bleeding 06/11/2021   Adnexal mass 06/11/2021   Hot flashes 06/11/2021   Constipation 06/11/2021   history of COVID 02/2021   Pain in right knee 11/13/2020   Insomnia 08/19/2020   Gastroesophageal reflux disease 08/19/2020   Sacroiliac joint dysfunction of right side 12/19/2019   Degenerative disc disease, lumbar 09/04/2019   Psychogenic nonepileptic seizure 07/27/2017   Chronic migraine without aura, with intractable migraine, so stated, with status migrainosus 07/27/2017   Diarrhea 07/08/2017   Nonepileptic episode ([place]) 12/22/2016   Sicca syndrome ([place]) 11/22/2016   Raynaud's disease without gangrene 11/22/2016   Hair loss 11/22/2016   Photosensitivity 11/22/2016   History of Chiari malformation 11/22/2016   High risk medication use 11/22/2016   Other epilepsy, intractable, without status epilepticus ([place]) 11/01/2016   Arthralgia 08/18/2016   Pain in unspecified joint 08/18/2016   Fatigue 03/26/2016   SLE (systemic lupus erythematosus) ([place]) 03/26/2016   Depression with anxiety 03/26/2016   Numbness and tingling 02/25/2016   Transient alteration of awareness 01/01/2016   Chiari malformation 12/19/2015   Family history of breast cancer 09/09/2015   Past Medical History:  Diagnosis Date   Adnexal mass 02/24/2021   in epic seen on [place]   Amenorrhea 08/14/2021   Anxiety 08/14/2021   Arthralgia 08/18/2016   Chondromalacia, patella 06/30/2021   Chronic constipation 08/14/2021   Chronic headaches HISTORY OF MIGRAINES 08/14/2021   Chronic migraine without aura, with intractable migraine, so stated, with status migrainosus 08/67/6195   Complication of anesthesia 08/14/2021   PONV   Cyst of left ovary 06/17/2021   Degenerative disc disease, lumbar 09/04/2019   Depression with anxiety 03/26/2016   Diarrhea 07/08/2017   Effusion, left knee 12/16/2021   Endometriosis    Fatigue 03/26/2016   Gastroesophageal reflux disease  08/19/2020   Hair loss 11/22/2016   High risk medication use 11/22/2016   history of Chiari malformation    History of Chiari malformation 11/22/2016   history of COVID 02/2021   LOW GRADE FEVER CONGESTION AND HEADACHE ALL SYMPTOMS RESOLVED   Hot flashes 06/11/2021   Hyperlipidemia    Insomnia 08/19/2020   Low blood pressure    pt states history of low blood pressure-fainted 12/2010-instructed to move slowly   Nonepileptic episode ([place]) 12/22/2016   Numbness and tingling 02/25/2016   OSTEOARTHRITIS 08/14/2021   Other epilepsy, intractable, without status epilepticus ([place]) 11/01/2016   Pain in right knee 11/13/2020   Photosensitivity 11/22/2016   Psychogenic nonepileptic seizure 07/27/2017   Raynaud's disease without gangrene  11/22/2016   Rectal bleeding 06/11/2021   Right lower quadrant abdominal pain 06/05/2021   Sacroiliac joint dysfunction of right side 12/19/2019   Sicca syndrome ([place]) 11/22/2016   SLE (systemic lupus erythematosus) ([place]) 03/26/2016   Subclinical hypothyroidism    Transient alteration of awareness 01/01/2016   Wears glasses for reading 08/14/2021    Family History  Problem Relation Age of Onset   Hypertension Mother    CAD Mother    Breast cancer Mother    Migraines Mother    Gout Mother    Heart disease Mother    Crohn's disease Mother    Hypertension Father    CAD Father    CVA Father    Heart disease Father    Colon polyps Father    Liver disease Father    Sjogren's syndrome Sister    Celiac disease Sister    Rheum arthritis Maternal Grandmother    Dementia Maternal Grandmother    Pancreatic cancer Maternal Grandfather    CAD Paternal Grandmother    Macular degeneration Paternal Grandmother    CVA Paternal Grandfather    Autism Son    ADD / ADHD Son    Breast cancer Maternal Aunt    Esophageal cancer Neg Hx    Colon cancer Neg Hx    Stomach cancer Neg Hx     Past Surgical History:  Procedure Laterality Date   ABDOMINAL HYSTERECTOMY   2007   CYSTOSCOPY  2007   WITH HYSTERECTOMY   DIAGNOSTIC LAPAROSCOPY  2005   OOPHORECTOMY Left 08/18/2021   ROTATOR CUFF REPAIR Right 2010   SALPINGOOPHORECTOMY  04/16/2011   Procedure: SALPINGO OOPHERECTOMY;  Surgeon: Lovenia Kim, MD;  Location: [facility];  Service: Gynecology;  Laterality: Right;   skull decompression surgery 2005     @ [place] History   Occupational History   Occupation: nurse  Tobacco Use   Smoking status: Former    Packs/day: 1.00    Years: 10.00    Total pack years: 10.00    Types: Cigarettes    Quit date: 04/04/2004    Years since quitting: 17.7    Passive exposure: Past   Smokeless tobacco: Never  Vaping Use   Vaping Use: Never used  Substance and Sexual Activity   Alcohol use: Not Currently   Drug use: Never   Sexual activity: Yes    Birth control/protection: Surgical

## 2022-01-01 DIAGNOSIS — R002 Palpitations: Secondary | ICD-10-CM | POA: Diagnosis not present

## 2022-01-07 NOTE — Addendum Note (Signed)
Addended by: Truddie Hidden on: 01/07/2022 05:11 PM   Modules accepted: Orders

## 2022-01-08 ENCOUNTER — Ambulatory Visit: Payer: BC Managed Care – PPO | Attending: Cardiology

## 2022-01-08 DIAGNOSIS — I441 Atrioventricular block, second degree: Secondary | ICD-10-CM

## 2022-01-12 ENCOUNTER — Encounter: Payer: Self-pay | Admitting: Orthopaedic Surgery

## 2022-01-12 ENCOUNTER — Ambulatory Visit (INDEPENDENT_AMBULATORY_CARE_PROVIDER_SITE_OTHER): Payer: BC Managed Care – PPO | Admitting: Orthopaedic Surgery

## 2022-01-12 DIAGNOSIS — M2242 Chondromalacia patellae, left knee: Secondary | ICD-10-CM

## 2022-01-12 NOTE — Progress Notes (Signed)
Office Visit Note   Patient: Kim Pham           Date of Birth: [birth year]           MRN: [id number] Visit Date: 01/12/2022              Requested by: Debbrah Alar, NP [address] [place],   [id number] PCP: Debbrah Alar, NP   Assessment & Plan: Visit Diagnoses:  1. Chondromalacia of left patella     Plan: 3 weeks status post left knee arthroscopy evidence of chondromalacia patella.  There was loose articular cartilage within the trochlear which was debrided.  There were no loose bodies.  Medial lateral compartments were relatively clear of meniscal pathology and chondromalacia.  Doing exceptionally well and relates that all of her symptoms have resolved.  She walks without a limp.  The knee was not effused and she is not taking any medicine.  Have encouraged her to work on quad strengthening exercises and plan to see her back as needed.  She is aware that she may have recurrent symptoms over time Follow-Up Instructions: Return if symptoms worsen or fail to improve.   Orders:  No orders of the defined types were placed in this encounter.  No orders of the defined types were placed in this encounter.     Procedures: No procedures performed   Clinical Data: No additional findings.   Subjective: Chief Complaint  Patient presents with   Left Knee - Routine Post Op    HPI  Review of Systems   Objective: Vital Signs: There were no vitals taken for this visit.  Physical Exam Constitutional:      Appearance: She is well-developed.  Eyes:     Pupils: Pupils are equal, round, and reactive to light.  Pulmonary:     Effort: Pulmonary effort is normal.  Skin:    General: Skin is warm and dry.  Neurological:     Mental Status: She is alert and oriented to person, place, and time.  Psychiatric:        Behavior: Behavior normal.     Ortho Exam left knee was not hot warm or red.  No effusion.  Arthroscopic portals of healed  without problem.  No medial or lateral joint pain.  No pain getting up from a sitting position or discomfort with ambulation.  There was no patella crepitation.  Specialty Comments:  No specialty comments available.  Imaging: No results found.   PMFS History: Patient Active Problem List   Diagnosis Date Noted   Subclinical hypothyroidism 12/18/2021   Effusion, left knee 12/16/2021   Endometriosis 12/16/2021   Cardiac murmur 12/16/2021   Obesity (BMI 30.0-34.9) 12/16/2021   Coronary artery calcification seen on CT scan 12/16/2021   Palpitations 12/15/2021   Preventative health care 09/08/2021   Blood in stool 09/08/2021   Hyperlipidemia 09/08/2021   Back pain 09/04/2021   Pelvic pain in female 08/18/2021   Amenorrhea 08/14/2021   Anxiety 08/14/2021   Chronic constipation 08/14/2021   Chronic headaches HISTORY OF MIGRAINES 41/32/4401   Complication of anesthesia 08/14/2021   HISTORY OF MIGRAINES 08/14/2021   OSTEOARTHRITIS 08/14/2021   Wears glasses for reading 08/14/2021   Chondromalacia, patella 06/30/2021   Family history of BRCA gene mutation 06/17/2021   Cyst of left ovary 06/17/2021   Vasomotor symptoms due to menopause 06/17/2021   Rectal bleeding 06/11/2021   Adnexal mass 06/11/2021   Hot flashes 06/11/2021   Constipation 06/11/2021  history of COVID 02/2021   Pain in right knee 11/13/2020   Insomnia 08/19/2020   Gastroesophageal reflux disease 08/19/2020   Sacroiliac joint dysfunction of right side 12/19/2019   Degenerative disc disease, lumbar 09/04/2019   Psychogenic nonepileptic seizure 07/27/2017   Chronic migraine without aura, with intractable migraine, so stated, with status migrainosus 07/27/2017   Diarrhea 07/08/2017   Nonepileptic episode ([place]) 12/22/2016   Sicca syndrome ([place]) 11/22/2016   Raynaud's disease without gangrene 11/22/2016   Hair loss 11/22/2016   Photosensitivity 11/22/2016   History of Chiari malformation 11/22/2016   High risk  medication use 11/22/2016   Other epilepsy, intractable, without status epilepticus ([place]) 11/01/2016   Arthralgia 08/18/2016   Pain in unspecified joint 08/18/2016   Fatigue 03/26/2016   SLE (systemic lupus erythematosus) ([place]) 03/26/2016   Depression with anxiety 03/26/2016   Numbness and tingling 02/25/2016   Transient alteration of awareness 01/01/2016   Chiari malformation 12/19/2015   Family history of breast cancer 09/09/2015   Past Medical History:  Diagnosis Date   Adnexal mass 02/24/2021   in epic seen on [place]   Amenorrhea 08/14/2021   Anxiety 08/14/2021   Arthralgia 08/18/2016   Chondromalacia, patella 06/30/2021   Chronic constipation 08/14/2021   Chronic headaches HISTORY OF MIGRAINES 08/14/2021   Chronic migraine without aura, with intractable migraine, so stated, with status migrainosus 31/49/7026   Complication of anesthesia 08/14/2021   PONV   Cyst of left ovary 06/17/2021   Degenerative disc disease, lumbar 09/04/2019   Depression with anxiety 03/26/2016   Diarrhea 07/08/2017   Effusion, left knee 12/16/2021   Endometriosis    Fatigue 03/26/2016   Gastroesophageal reflux disease 08/19/2020   Hair loss 11/22/2016   High risk medication use 11/22/2016   history of Chiari malformation    History of Chiari malformation 11/22/2016   history of COVID 02/2021   LOW GRADE FEVER CONGESTION AND HEADACHE ALL SYMPTOMS RESOLVED   Hot flashes 06/11/2021   Hyperlipidemia    Insomnia 08/19/2020   Low blood pressure    pt states history of low blood pressure-fainted 12/2010-instructed to move slowly   Nonepileptic episode ([place]) 12/22/2016   Numbness and tingling 02/25/2016   OSTEOARTHRITIS 08/14/2021   Other epilepsy, intractable, without status epilepticus ([place]) 11/01/2016   Pain in right knee 11/13/2020   Photosensitivity 11/22/2016   Psychogenic nonepileptic seizure 07/27/2017   Raynaud's disease without gangrene 11/22/2016   Rectal bleeding 06/11/2021   Right  lower quadrant abdominal pain 06/05/2021   Sacroiliac joint dysfunction of right side 12/19/2019   Sicca syndrome ([place]) 11/22/2016   SLE (systemic lupus erythematosus) ([place]) 03/26/2016   Subclinical hypothyroidism    Transient alteration of awareness 01/01/2016   Wears glasses for reading 08/14/2021    Family History  Problem Relation Age of Onset   Hypertension Mother    CAD Mother    Breast cancer Mother    Migraines Mother    Gout Mother    Heart disease Mother    Crohn's disease Mother    Hypertension Father    CAD Father    CVA Father    Heart disease Father    Colon polyps Father    Liver disease Father    Sjogren's syndrome Sister    Celiac disease Sister    Rheum arthritis Maternal Grandmother    Dementia Maternal Grandmother    Pancreatic cancer Maternal Grandfather    CAD Paternal Grandmother    Macular degeneration Paternal Grandmother    CVA Paternal  Grandfather    Autism Son    ADD / ADHD Son    Breast cancer Maternal Aunt    Esophageal cancer Neg Hx    Colon cancer Neg Hx    Stomach cancer Neg Hx     Past Surgical History:  Procedure Laterality Date   ABDOMINAL HYSTERECTOMY  2007   CYSTOSCOPY  2007   WITH HYSTERECTOMY   DIAGNOSTIC LAPAROSCOPY  2005   OOPHORECTOMY Left 08/18/2021   ROTATOR CUFF REPAIR Right 2010   SALPINGOOPHORECTOMY  04/16/2011   Procedure: SALPINGO OOPHERECTOMY;  Surgeon: Lovenia Kim, MD;  Location: [facility];  Service: Gynecology;  Laterality: Right;   skull decompression surgery 2005     @ [place] History   Occupational History   Occupation: nurse  Tobacco Use   Smoking status: Former    Packs/day: 1.00    Years: 10.00    Total pack years: 10.00    Types: Cigarettes    Quit date: 04/04/2004    Years since quitting: 17.7    Passive exposure: Past   Smokeless tobacco: Never  Vaping Use   Vaping Use: Never used  Substance and Sexual Activity   Alcohol use: Not Currently   Drug use: Never   Sexual  activity: Yes    Birth control/protection: Surgical     Garald Balding, MD   Note - This record has been created using Editor, commissioning.  Chart creation errors have been sought, but may not always  have been located. Such creation errors do not reflect on  the standard of medical care.

## 2022-01-14 ENCOUNTER — Emergency Department (HOSPITAL_BASED_OUTPATIENT_CLINIC_OR_DEPARTMENT_OTHER)
Admission: EM | Admit: 2022-01-14 | Discharge: 2022-01-14 | Disposition: A | Discharge status: Home / Self Care | Payer: BC Managed Care – PPO | Attending: Emergency Medicine | Admitting: Emergency Medicine

## 2022-01-14 ENCOUNTER — Other Ambulatory Visit: Payer: Self-pay

## 2022-01-14 ENCOUNTER — Emergency Department (HOSPITAL_BASED_OUTPATIENT_CLINIC_OR_DEPARTMENT_OTHER): Payer: BC Managed Care – PPO

## 2022-01-14 ENCOUNTER — Encounter: Payer: Self-pay | Admitting: Cardiology

## 2022-01-14 DIAGNOSIS — F419 Anxiety disorder, unspecified: Secondary | ICD-10-CM | POA: Diagnosis not present

## 2022-01-14 DIAGNOSIS — R002 Palpitations: Secondary | ICD-10-CM | POA: Insufficient documentation

## 2022-01-14 DIAGNOSIS — R0789 Other chest pain: Secondary | ICD-10-CM | POA: Diagnosis not present

## 2022-01-14 DIAGNOSIS — R0602 Shortness of breath: Secondary | ICD-10-CM | POA: Diagnosis not present

## 2022-01-14 DIAGNOSIS — R079 Chest pain, unspecified: Secondary | ICD-10-CM | POA: Diagnosis not present

## 2022-01-14 LAB — CBC
HCT: 41.6 % (ref 36.0–46.0)
Hemoglobin: 13.5 g/dL (ref 12.0–15.0)
MCH: 28 pg (ref 26.0–34.0)
MCHC: 32.5 g/dL (ref 30.0–36.0)
MCV: 86.1 fL (ref 80.0–100.0)
Platelets: 310 10*3/uL (ref 150–400)
RBC: 4.83 MIL/uL (ref 3.87–5.11)
RDW: 14.4 % (ref 11.5–15.5)
WBC: 5.7 10*3/uL (ref 4.0–10.5)
nRBC: 0 % (ref 0.0–0.2)

## 2022-01-14 LAB — BASIC METABOLIC PANEL
Anion gap: 9 (ref 5–15)
BUN: 12 mg/dL (ref 6–20)
CO2: 24 mmol/L (ref 22–32)
Calcium: 9.8 mg/dL (ref 8.9–10.3)
Chloride: 106 mmol/L (ref 98–111)
Creatinine, Ser: 0.77 mg/dL (ref 0.44–1.00)
GFR, Estimated: >60 mL/min (ref >60)
Glucose, Bld: 99 mg/dL (ref 70–99)
Potassium: 3.9 mmol/L (ref 3.5–5.1)
Sodium: 139 mmol/L (ref 135–145)

## 2022-01-14 LAB — TROPONIN I (HIGH SENSITIVITY)
Troponin I (High Sensitivity): 3 ng/L (ref <18)
Troponin I (High Sensitivity): 3 ng/L (ref <18)

## 2022-01-14 LAB — TSH: TSH: 4.051 u[IU]/mL (ref 0.350–4.500)

## 2022-01-14 MED ORDER — ONDANSETRON HCL 4 MG/2ML IJ SOLN
4.0000 mg | Freq: Once | INTRAMUSCULAR | Status: AC
  Administered 2022-01-14: 4 mg via INTRAVENOUS
  Filled 2022-01-14: qty 2

## 2022-01-14 MED ORDER — MORPHINE SULFATE (PF) 4 MG/ML IV SOLN
4.0000 mg | Freq: Once | INTRAVENOUS | Status: AC
  Administered 2022-01-14: 4 mg via INTRAVENOUS
  Filled 2022-01-14: qty 1

## 2022-01-14 MED ORDER — HYDROXYZINE HCL 25 MG PO TABS: 25.0000 mg | ORAL_TABLET | Freq: Four times a day (QID) | ORAL | 0 refills | Status: AC

## 2022-01-14 NOTE — ED Notes (Signed)
Repeat EKG done.

## 2022-01-14 NOTE — ED Notes (Signed)
Pt called out saying her chest hurts worse and she is dizzy. Hyperventilating in room. Provider notified.

## 2022-01-14 NOTE — ED Triage Notes (Signed)
Pt to ED C/O Chest pain that started at 10 am. Progressively getting worse. Reports relief at rest. Reports SHOB. Pt anxious in triage. Reports recent change in thyroid medications. QI:HKVQQV valve regurgitation. 2nd degree AV block.

## 2022-01-14 NOTE — ED Provider Notes (Signed)
[facility] EMERGENCY DEPARTMENT Provider Note   CSN: [id number] Arrival date & time: 01/14/22  1505     History  Chief Complaint  Patient presents with   Chest Pain    Kim Pham is a 41 y.o. female.  HPI   Medical history including Hashimoto's, heart palpitations, hyperlipidemia presents with complaints of chest pain and heart palpitations.  He states has been going on since today, states chest pain is intermittent without provoking factors, states that she will feel her heart starts to skip a beat she will become slightly short of breath become real anxious, she states that during the symptoms will eventually resolve on their own.  She states that she has a pulse ox at home and placed on her finger when these episodes she notes that her heart rate was up to 150 and her O2 sats was in the 80s once the episode ended her heart rate and O2 sats had normalized.  She currently being treated for hyperlipidemia, former smoker without history of diabetes, hypertension, no history of PEs or DVTs currently not on hormone therapy.  Patient states that she is being treated for Hashimoto's and had a recent change in her TH medications about 3 weeks ago, showing that this might of caused it.  Reviewed patient's chart followed by cardiology recent echocardiogram was unremarkable, had a heart monitor for 7 days showed rhythm mainly sinus, had 1 episode of second-degree heart block and is going for 30-day heart monitor.  Currently being treated for hyperlipidemia and has known history of heart palpitations not a candidate for beta-blocker due to her low blood pressure.      Home Medications Prior to Admission medications   Medication Sig Start Date End Date Taking? Authorizing Provider  hydrOXYzine (ATARAX) 25 MG tablet Take 1 tablet (25 mg total) by mouth every 6 (six) hours for 15 days. 01/14/22 01/29/22 Yes Marcello Fennel, PA-C  cyclobenzaprine (FLEXERIL) 5 MG tablet 1-2 tabs  by mouth at bedtime as needed for back pain. 09/04/21   Debbrah Alar, NP  fluticasone (FLONASE) 50 MCG/ACT nasal spray Place 2 sprays into both nostrils daily as needed for allergies or rhinitis. 06/01/21   Debbrah Alar, NP  gabapentin (NEURONTIN) 300 MG capsule TAKE ONE CAPSULE BY MOUTH AT BEDTIME 11/26/21   Debbrah Alar, NP  HYDROcodone-acetaminophen (NORCO/VICODIN) 5-325 MG tablet Take 1 tablet by mouth every 4 (four) hours as needed for moderate pain. 12/31/21 12/31/22  Persons, Bevely Palmer, PA  meloxicam (MOBIC) 15 MG tablet Take 1 tablet (15 mg total) by mouth daily. 12/11/21   Marrian Salvage, FNP  oxyCODONE-acetaminophen (PERCOCET/ROXICET) 5-325 MG tablet Take 1 tablet by mouth every 4 (four) hours as needed for severe pain. 12/24/21   Garald Balding, MD  pantoprazole (PROTONIX) 40 MG tablet Take 1 tablet (40 mg total) by mouth daily. 10/07/21   Jackquline Denmark, MD  PARoxetine (PAXIL) 20 MG tablet Take 1 tablet (20 mg total) by mouth at bedtime. 08/13/21   Truett Mainland, DO  rosuvastatin (CRESTOR) 20 MG tablet Take 1 tablet (20 mg total) by mouth daily. 12/28/21 12/23/22  Revankar, Reita Cliche, MD  SUMAtriptan (IMITREX) 100 MG tablet Take 1 tablet (100 mg total) by mouth at onset of migraine. May repeat in 2 hours if headache persists or recurs. Do not take more than 2 doses in 24 hours. 06/29/17   Melvenia Beam, MD  traZODone (DESYREL) 100 MG tablet TAKE TWO TABLETS BY MOUTH EVERY NIGHT AT  BEDTIME 10/15/21   Debbrah Alar, NP      Allergies    Effexor [venlafaxine], Tramadol, Norco [hydrocodone-acetaminophen], and Voltaren [diclofenac]    Review of Systems   Review of Systems  Constitutional:  Negative for chills and fever.  Respiratory:  Positive for shortness of breath.   Cardiovascular:  Positive for chest pain and palpitations.  Gastrointestinal:  Negative for abdominal pain.  Neurological:  Negative for headaches.    Physical Exam Updated Vital Signs BP  115/79   Pulse (!) 56   Temp 98.4 F (36.9 C) (Oral)   Resp 16   Ht '5\' 9"'$  (1.753 m)   Wt 102.1 kg   SpO2 91%   BMI 33.23 kg/m  Physical Exam Vitals and nursing note reviewed.  Constitutional:      General: She is not in acute distress.    Appearance: She is not ill-appearing.  HENT:     Head: Normocephalic and atraumatic.     Nose: No congestion.  Eyes:     Conjunctiva/sclera: Conjunctivae normal.  Cardiovascular:     Rate and Rhythm: Normal rate and regular rhythm.     Pulses: Normal pulses.     Heart sounds: No murmur heard.    No friction rub. No gallop.  Pulmonary:     Effort: No respiratory distress.     Breath sounds: No wheezing, rhonchi or rales.  Musculoskeletal:     Right lower leg: No edema.     Left lower leg: No edema.     Comments: No leg swelling, no calf tenderness  Skin:    General: Skin is warm and dry.  Neurological:     Mental Status: She is alert.  Psychiatric:        Mood and Affect: Mood normal.     ED Results / Procedures / Treatments   Labs (all labs ordered are listed, but only abnormal results are displayed) Labs Reviewed  BASIC METABOLIC PANEL  CBC  TSH  TROPONIN I (HIGH SENSITIVITY)  TROPONIN I (HIGH SENSITIVITY)    EKG EKG Interpretation  Date/Time:  Thursday January 14 2022 15:14:08 EDT Ventricular Rate:  92 PR Interval:  150 QRS Duration: 76 QT Interval:  328 QTC Calculation: 405 R Axis:   35 Text Interpretation: Normal sinus rhythm Low voltage QRS Borderline ECG When compared with ECG of 21-Apr-2021 10:11, PREVIOUS ECG IS PRESENT Confirmed by Lennice Sites ([id number]) on 01/14/2022 3:36:36 PM  Radiology DG Chest 2 View  Result Date: 01/14/2022 CLINICAL DATA:  Chest pain, shortness of breath EXAM: CHEST - 2 VIEW COMPARISON:  04/21/2021 FINDINGS: The heart size and mediastinal contours are within normal limits. Both lungs are clear. The visualized skeletal structures are unremarkable. IMPRESSION: No active cardiopulmonary  disease. Electronically Signed   By: Elmer Picker M.D.   On: 01/14/2022 15:29    Procedures Procedures    Medications Ordered in ED Medications  morphine (PF) 4 MG/ML injection 4 mg (4 mg Intravenous Given 01/14/22 1753)  ondansetron (ZOFRAN) injection 4 mg (4 mg Intravenous Given 01/14/22 1752)    ED Course/ Medical Decision Making/ A&P                           Medical Decision Making Amount and/or Complexity of Data Reviewed Labs: ordered. Radiology: ordered.  Risk Prescription drug management.   This patient presents to the ED for concern of chest pain/heart palpitations, this involves an extensive number of treatment options, and is  a complaint that carries with it a high risk of complications and morbidity.  The differential diagnosis includes PE, ACS, hyperthyroidism, pneumonia    Additional history obtained:  Additional history obtained from N/A External records from outside source obtained and reviewed including previous cardiology notes   Co morbidities that complicate the patient evaluation  Hashimoto's  Social Determinants of Health:  N/A    Lab Tests:  I Ordered, and personally interpreted labs.  The pertinent results include: CBC unremarkable, BMP unremarkable, troponin is 3, second troponin is negative   Imaging Studies ordered:  I ordered imaging studies including chest x-ray I independently visualized and interpreted imaging which showed a negative for acute findings I agree with the radiologist interpretation   Cardiac Monitoring:  The patient was maintained on a cardiac monitor.  I personally viewed and interpreted the cardiac monitored which showed an underlying rhythm of: Without signs of ischemia   Medicines ordered and prescription drug management:  I ordered medication including N/A I have reviewed the patients home medicines and have made adjustments as needed  Critical  Interventions:  N/A   Reevaluation:   Presents with concerns of heart palpitations, chest pain lab work was obtained which is unremarkable, will add on TSH for further evaluation.  Patient reassessed states she is feeling better, vital signs remained stable, she is agreement plan discharge at this time.    Consultations Obtained:  N/A  Test Considered:  N/A    Rule out I have low suspicion for ACS as history is atypical, patient has no significant cardiac history, EKG was sinus rhythm without signs of ischemia, patient had negative  delta troponin.  Low suspicion for PE as patient denies pleuritic chest pain, shortness of breath, patient denies leg pain, no pedal edema noted on exam, patient was PERC negative.  Low suspicion for AAA or aortic dissection as history is atypical, patient has low risk factors.  Suspicion for hyperthyroidism is low at this since heart rate has remained stable in the 50s and 60s, would expect simply elevated heart rate at this time.  low suspicion for systemic infection as patient is nontoxic-appearing, vital signs reassuring, no obvious source infection noted on exam.    Dispostion and problem list  After consideration of the diagnostic results and the patients response to treatment, I feel that the patent would benefit from discharge.  Chest pain-I suspect pain is secondary due to frequent PVCs versus possible bouts of SVT, patient is BP has remained on the lower end and I would not recommend beta-blockers at this time as do not want to bottom her out, I want her to follow-up with her cardiologist for the 30-day Holter monitor as originally planned.  Her TSH is a send out and I instructed the patient to follow-up on her MyChart account in reach out to her PCP if abnormal.  I have given her strict return precautions.            Final Clinical Impression(s) / ED Diagnoses Final diagnoses:  Palpitations  Atypical chest pain    Rx / DC  Orders ED Discharge Orders          Ordered    hydrOXYzine (ATARAX) 25 MG tablet  Every 6 hours        01/14/22 1849              Marcello Fennel, PA-C 01/14/22 [place], [place], DO 01/14/22 2232

## 2022-01-14 NOTE — Discharge Instructions (Signed)
Lab work imaging were reassuring, your TSH was a send out lab and should result within the next 24 hours, you will find them on your MyChart account if they are abnormal I like you to consult your primary care provider for further evaluation.  Suspect your heart palpitations are coming from PVCs, please follow-up with your cardiologist for that Holter monitor.  I also given you Vistaril which can help with anxiety, please take as prescribed.  Come back to the emergency department if you develop chest pain, shortness of breath, severe abdominal pain, uncontrolled nausea, vomiting, diarrhea.

## 2022-01-15 DIAGNOSIS — I441 Atrioventricular block, second degree: Secondary | ICD-10-CM

## 2022-01-15 DIAGNOSIS — R002 Palpitations: Secondary | ICD-10-CM | POA: Diagnosis not present

## 2022-01-15 DIAGNOSIS — I455 Other specified heart block: Secondary | ICD-10-CM | POA: Diagnosis not present

## 2022-02-15 ENCOUNTER — Encounter: Payer: Self-pay | Admitting: Family

## 2022-02-16 NOTE — Telephone Encounter (Signed)
Patient scheduled to see Percell Miller tomorrow am

## 2022-02-17 ENCOUNTER — Telehealth (INDEPENDENT_AMBULATORY_CARE_PROVIDER_SITE_OTHER): Payer: BC Managed Care – PPO | Admitting: Medical

## 2022-02-17 DIAGNOSIS — J3489 Other specified disorders of nose and nasal sinuses: Secondary | ICD-10-CM | POA: Diagnosis not present

## 2022-02-17 DIAGNOSIS — R058 Other specified cough: Secondary | ICD-10-CM | POA: Diagnosis not present

## 2022-02-17 DIAGNOSIS — M329 Systemic lupus erythematosus, unspecified: Secondary | ICD-10-CM

## 2022-02-17 DIAGNOSIS — U071 COVID-19: Secondary | ICD-10-CM | POA: Diagnosis not present

## 2022-02-17 MED ORDER — FLUTICASONE PROPIONATE 50 MCG/ACT NA SUSP: 2.0000 | Freq: Every day | NASAL | 1 refills | Status: DC

## 2022-02-17 MED ORDER — BENZONATATE 100 MG PO CAPS: 100.0000 mg | ORAL_CAPSULE | Freq: Three times a day (TID) | ORAL | 0 refills | Status: DC | PRN

## 2022-02-17 MED ORDER — AZITHROMYCIN 250 MG PO TABS: ORAL_TABLET | ORAL | 0 refills | Status: AC

## 2022-02-17 NOTE — Progress Notes (Signed)
   Subjective:    Patient ID: Kim Pham, female    DOB: April 19, 1981, 41 y.o.   MRN: [id number]  HPI  Virtual Visit via Video Note  I connected with LIMA CHILLEMI on 02/17/22 at  8:40 AM EDT by a video enabled telemedicine application and verified that I am speaking with the correct person using two identifiers.  Location: Patient: home Provider: office.   I discussed the limitations of evaluation and management by telemedicine and the availability of in person appointments. The patient expressed understanding and agreed to proceed.  Note did connect successfully with audio and video but it was obvious that patient is an there was a reverberating of mild voice/echoing.  After a bit I thought this was likely very disturbing for patient so decided to finish up visit via telephone.  History of Present Illness: Pt recently diagnosed with covid on monday  Test used-ptc  History of covid vaccine 3 times.  Covid risk score-   History of covid infection- 3 times in past. Pt states this time has less symptoms.  Current symptoms- started with symptoms on Sunday evening. Monday had low grade fever. Pt state fever on Monday night was 102.5. Pt fever this morning broke. Did not take any med for fever this morning. Some mild bodyaches, ha and nasal along with chest congestion.  Pt 02 sat 96%.   Pt states first time had covid.    One time in past she did take paxlovid.  Some productive cough.   Observations/Objective:   General-no acute distress, pleasant, oriented. Lungs- on inspection lungs appear unlabored. Neck- no tracheal deviation or jvd on inspection. Neuro- gross motor function appears intact.    Assessment and Plan: Patient Instructions  COVID infection day 4 of signs/symptoms.  COVID infections 2 times and vaccinated 3 times.  1 time in the past took Paxlovid other to I did well with antiviral.  Discussed with your age and medical history antiviral not clearly  indicated and medication still under emergency authorization use.  Since improving gradually to start antiviral not necessary.  Prescribed Flonase nasal spray for nasal congestion.  Benzonatate for cough.  I went ahead and sent in azithromycin antibiotic.  Advised not to start but if getting sinus infection symptoms or chest congestion then go ahead and start antibiotic this weekend.  Any significant chest congestion would recommend chest x-ray.  Continue to monitor O2 sat.  Let me know if O2 sats drop less than 96%.  Discussed present quarantine guidelines.  Follow-up in 7 days or sooner if needed.   Follow Up Instructions:    I discussed the assessment and treatment plan with the patient. The patient was provided an opportunity to ask questions and all were answered. The patient agreed with the plan and demonstrated an understanding of the instructions.   The patient was advised to call back or seek an in-person evaluation if the symptoms worsen or if the condition fails to improve as anticipated.     Mackie Pai, PA-C   Review of Systems     Objective:   Physical Exam        Assessment & Plan:

## 2022-02-17 NOTE — Patient Instructions (Signed)
COVID infection day 4 of signs/symptoms.  COVID infections 2 times and vaccinated 3 times.  1 time in the past took Paxlovid other to I did well with antiviral.  Discussed with your age and medical history antiviral not clearly indicated and medication still under emergency authorization use.  Since improving gradually to start antiviral not necessary.  Prescribed Flonase nasal spray for nasal congestion.  Benzonatate for cough.  I went ahead and sent in azithromycin antibiotic.  Advised not to start but if getting sinus infection symptoms or chest congestion then go ahead and start antibiotic this weekend.  Any significant chest congestion would recommend chest x-ray.  Continue to monitor O2 sat.  Let me know if O2 sats drop less than 96%.  Discussed present quarantine guidelines.  Follow-up in 7 days or sooner if needed.

## 2022-02-19 ENCOUNTER — Encounter: Payer: Self-pay | Admitting: Cardiology

## 2022-02-22 ENCOUNTER — Encounter: Payer: Self-pay | Admitting: Family

## 2022-02-22 DIAGNOSIS — E063 Autoimmune thyroiditis: Secondary | ICD-10-CM | POA: Diagnosis not present

## 2022-02-22 DIAGNOSIS — R0602 Shortness of breath: Secondary | ICD-10-CM

## 2022-02-22 DIAGNOSIS — E271 Primary adrenocortical insufficiency: Secondary | ICD-10-CM | POA: Diagnosis not present

## 2022-02-22 DIAGNOSIS — E039 Hypothyroidism, unspecified: Secondary | ICD-10-CM | POA: Diagnosis not present

## 2022-02-22 DIAGNOSIS — R5383 Other fatigue: Secondary | ICD-10-CM | POA: Diagnosis not present

## 2022-02-24 NOTE — Addendum Note (Signed)
Addended by: Debbrah Alar on: 02/24/2022 12:52 PM   Modules accepted: Orders

## 2022-03-13 DIAGNOSIS — R079 Chest pain, unspecified: Secondary | ICD-10-CM | POA: Diagnosis not present

## 2022-03-13 DIAGNOSIS — R001 Bradycardia, unspecified: Secondary | ICD-10-CM | POA: Diagnosis not present

## 2022-03-13 DIAGNOSIS — I959 Hypotension, unspecified: Secondary | ICD-10-CM | POA: Diagnosis not present

## 2022-03-13 DIAGNOSIS — R0689 Other abnormalities of breathing: Secondary | ICD-10-CM | POA: Diagnosis not present

## 2022-03-17 ENCOUNTER — Encounter: Payer: Self-pay | Admitting: Pulmonary Disease

## 2022-03-17 ENCOUNTER — Ambulatory Visit (INDEPENDENT_AMBULATORY_CARE_PROVIDER_SITE_OTHER): Payer: BC Managed Care – PPO | Admitting: Pulmonary Disease

## 2022-03-17 VITALS — BP 118/74 | HR 68 | Ht 69.0 in | Wt 230.4 lb

## 2022-03-17 DIAGNOSIS — J683 Other acute and subacute respiratory conditions due to chemicals, gases, fumes and vapors: Secondary | ICD-10-CM | POA: Diagnosis not present

## 2022-03-17 DIAGNOSIS — R0609 Other forms of dyspnea: Secondary | ICD-10-CM

## 2022-03-17 DIAGNOSIS — E038 Other specified hypothyroidism: Secondary | ICD-10-CM

## 2022-03-17 DIAGNOSIS — E063 Autoimmune thyroiditis: Secondary | ICD-10-CM

## 2022-03-17 MED ORDER — FLUTICASONE-SALMETEROL 115-21 MCG/ACT IN AERO: 2.0000 | INHALATION_SPRAY | Freq: Two times a day (BID) | RESPIRATORY_TRACT | 12 refills | Status: DC

## 2022-03-17 NOTE — Progress Notes (Signed)
Synopsis: Referred in November 2023 for shortness of breath by Debbrah Alar, NP  Subjective:   PATIENT ID: Kim Pham GENDER: female DOB: [birth year], MRN: [id number]   HPI  Chief Complaint  Patient presents with   Consult    Referred by PCP for DOE. States she has had COVID 4 times, last episode was December 2022. Has cleared by cardiology.    Kim Pham is a 41 year old woman, former smoker with GERD, SLE, and Hashimoto's Thyroiditis who is referred to pulmonary clinic for shortness of breath.   She reports having covid 4 times since 2020 and since then has noticed progressive dyspnea. She does have intermittent wheezing with the dyspnea. She is able to walk 1-1.5 miles four days per week. She has needed to stop for rest recently since the cold air has increased her wheezing. She has gained 30 lbs over the past year and a half. She was diagnosed with Hashimoto's Thyroiditis earlier this year and reported adrenal insufficiency without being placed on steroid replacement therapy. She reports hair loss.   She was diagnosed with SLE in 2018 with significant joint pain that responded to plaquenil She is not currently requiring treatment. She has history of seasonal allergies in the spring and summer. She does have GERD and is taking protonix daily.   She reports episode of chest discomfort, shortness of breath and tachycardia in which EMS was called. She was given nitroglycerin and oxygen which helped her feel better and she did not go to the ER. She has been evaluated by cardiology with normal 30 day monitor and normal ECHO. CTA Chest from 04/23/21 did not show pulmonary emboli and no interstitial lung involvement.   She is a former smoker, quit smoking in 2005. She smoked half a pack per day for 7 years. She reports significant second hand smoke in childhood. 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 She reports working in [employer] homes recently which has been harder on her  breathing.   Past Medical History:  Diagnosis Date   Adnexal mass 02/24/2021   in epic seen on [place]   Amenorrhea 08/14/2021   Anxiety 08/14/2021   Arthralgia 08/18/2016   Chondromalacia, patella 06/30/2021   Chronic constipation 08/14/2021   Chronic headaches HISTORY OF MIGRAINES 08/14/2021   Chronic migraine without aura, with intractable migraine, so stated, with status migrainosus 60/01/9322   Complication of anesthesia 08/14/2021   PONV   Cyst of left ovary 06/17/2021   Degenerative disc disease, lumbar 09/04/2019   Depression with anxiety 03/26/2016   Diarrhea 07/08/2017   Effusion, left knee 12/16/2021   Endometriosis    Fatigue 03/26/2016   Gastroesophageal reflux disease 08/19/2020   Hair loss 11/22/2016   High risk medication use 11/22/2016   history of Chiari malformation    History of Chiari malformation 11/22/2016   history of COVID 02/2021   LOW GRADE FEVER CONGESTION AND HEADACHE ALL SYMPTOMS RESOLVED   Hot flashes 06/11/2021   Hyperlipidemia    Insomnia 08/19/2020   Low blood pressure    pt states history of low blood pressure-fainted 12/2010-instructed to move slowly   Nonepileptic episode ([place]) 12/22/2016   Numbness and tingling 02/25/2016   OSTEOARTHRITIS 08/14/2021   Other epilepsy, intractable, without status epilepticus ([place]) 11/01/2016   Pain in right knee 11/13/2020   Photosensitivity 11/22/2016   Psychogenic nonepileptic seizure 07/27/2017   Raynaud's disease without gangrene 11/22/2016   Rectal bleeding 06/11/2021   Right lower quadrant abdominal pain  06/05/2021   Sacroiliac joint dysfunction of right side 12/19/2019   Sicca syndrome ([place]) 11/22/2016   SLE (systemic lupus erythematosus) ([place]) 03/26/2016   Subclinical hypothyroidism    Transient alteration of awareness 01/01/2016   Wears glasses for reading 08/14/2021     Family History  Problem Relation Age of Onset   Hypertension Mother    CAD Mother    Breast cancer Mother    Migraines  Mother    Gout Mother    Heart disease Mother    Crohn's disease Mother    Hypertension Father    CAD Father    CVA Father    Heart disease Father    Colon polyps Father    Liver disease Father    Sjogren's syndrome Sister    Celiac disease Sister    Rheum arthritis Maternal Grandmother    Dementia Maternal Grandmother    Pancreatic cancer Maternal Grandfather    CAD Paternal Grandmother    Macular degeneration Paternal Grandmother    CVA Paternal Grandfather    Autism Son    ADD / ADHD Son    Breast cancer Maternal Aunt    Esophageal cancer Neg Hx    Colon cancer Neg Hx    Stomach cancer Neg Hx      Social History   Socioeconomic History   Marital status: Married    Spouse name: Not on file   Number of children: 1   Years of education: Assoc   Highest education level: Not on file  Occupational History   Occupation: nurse  Tobacco Use   Smoking status: Former    Packs/day: 1.00    Years: 10.00    Total pack years: 10.00    Types: Cigarettes    Quit date: 04/04/2004    Years since quitting: 17.9    Passive exposure: Past   Smokeless tobacco: Never  Vaping Use   Vaping Use: Never used  Substance and Sexual Activity   Alcohol use: Not Currently   Drug use: Never   Sexual activity: Yes    Birth control/protection: Surgical  Other Topics Concern   Not on file  Social History Narrative   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   Married   1 son- born 2004 has autism   Enjoys exercise, Surveyor, mining with son   Drinks 4-5 cups of coffee a day       Right handed   Social Determinants of Health   Financial Resource Strain: Not on file  Food Insecurity: Not on file  Transportation Needs: Not on file  Physical Activity: Not on file  Stress: Not on file  Social Connections: Not on file  Intimate Partner Violence: Not on file     Allergies  Allergen Reactions   Effexor [Venlafaxine] Nausea Only    Rash    Tramadol Nausea And Vomiting   Norco  [Hydrocodone-Acetaminophen] Rash   Voltaren [Diclofenac] Rash     Outpatient Medications Prior to Visit  Medication Sig Dispense Refill   benzonatate (TESSALON) 100 MG capsule Take 1 capsule (100 mg total) by mouth 3 (three) times daily as needed for cough. 30 capsule 0   fluticasone (FLONASE) 50 MCG/ACT nasal spray Place 2 sprays into both nostrils daily. 16 g 1   gabapentin (NEURONTIN) 300 MG capsule TAKE ONE CAPSULE BY MOUTH AT BEDTIME 90 capsule 1   levothyroxine (SYNTHROID) 50 MCG tablet Take 50 mcg by mouth daily before breakfast. Take 1.5 tablets each morning  meloxicam (MOBIC) 15 MG tablet Take 1 tablet (15 mg total) by mouth daily. 30 tablet 0   pantoprazole (PROTONIX) 40 MG tablet Take 1 tablet (40 mg total) by mouth daily. 90 tablet 3   PARoxetine (PAXIL) 20 MG tablet Take 1 tablet (20 mg total) by mouth at bedtime. 90 tablet 3   rosuvastatin (CRESTOR) 20 MG tablet Take 1 tablet (20 mg total) by mouth daily. 90 tablet 3   SUMAtriptan (IMITREX) 100 MG tablet Take 1 tablet (100 mg total) by mouth at onset of migraine. May repeat in 2 hours if headache persists or recurs. Do not take more than 2 doses in 24 hours. 10 tablet 2   traZODone (DESYREL) 100 MG tablet TAKE TWO TABLETS BY MOUTH EVERY NIGHT AT BEDTIME 180 tablet 1   cyclobenzaprine (FLEXERIL) 5 MG tablet 1-2 tabs by mouth at bedtime as needed for back pain. 30 tablet 0   fluticasone (FLONASE) 50 MCG/ACT nasal spray Place 2 sprays into both nostrils daily as needed for allergies or rhinitis. 16 g 1   HYDROcodone-acetaminophen (NORCO/VICODIN) 5-325 MG tablet Take 1 tablet by mouth every 4 (four) hours as needed for moderate pain. 20 tablet 0   oxyCODONE-acetaminophen (PERCOCET/ROXICET) 5-325 MG tablet Take 1 tablet by mouth every 4 (four) hours as needed for severe pain. 30 tablet 0   No facility-administered medications prior to visit.    Review of Systems  Constitutional:  Negative for chills, fever, malaise/fatigue and  weight loss.  HENT:  Positive for congestion. Negative for sinus pain and sore throat.   Eyes: Negative.   Respiratory:  Positive for shortness of breath. Negative for cough, hemoptysis, sputum production and wheezing.   Cardiovascular:  Positive for chest pain and palpitations. Negative for orthopnea, claudication and leg swelling.  Gastrointestinal:  Negative for abdominal pain, heartburn, nausea and vomiting.  Genitourinary: Negative.   Musculoskeletal:  Positive for joint pain. Negative for myalgias.  Skin:  Negative for rash.  Neurological:  Positive for headaches. Negative for weakness.  Endo/Heme/Allergies: Negative.   Psychiatric/Behavioral: Negative.        Objective:   Vitals:   03/17/22 1339  BP: 118/74  Pulse: 68  SpO2: 97%  Weight: 230 lb 6.4 oz (104.5 kg)  Height: '5\' 9"'$  (1.753 m)     Physical Exam Constitutional:      General: She is not in acute distress.    Appearance: She is not ill-appearing.  HENT:     Head: Normocephalic and atraumatic.  Eyes:     General: No scleral icterus.    Conjunctiva/sclera: Conjunctivae normal.     Pupils: Pupils are equal, round, and reactive to light.  Cardiovascular:     Rate and Rhythm: Normal rate and regular rhythm.     Pulses: Normal pulses.     Heart sounds: Normal heart sounds. No murmur heard. Pulmonary:     Effort: Pulmonary effort is normal.     Breath sounds: Normal breath sounds. No wheezing, rhonchi or rales.  Abdominal:     General: Bowel sounds are normal.     Palpations: Abdomen is soft.  Musculoskeletal:     Right lower leg: No edema.     Left lower leg: No edema.  Lymphadenopathy:     Cervical: No cervical adenopathy.  Skin:    General: Skin is warm and dry.  Neurological:     General: No focal deficit present.     Mental Status: She is alert.  Psychiatric:  Mood and Affect: Mood normal.        Behavior: Behavior normal.        Thought Content: Thought content normal.        Judgment:  Judgment normal.       CBC    Component Value Date/Time   WBC 5.7 01/14/2022 1517   RBC 4.83 01/14/2022 1517   HGB 13.5 01/14/2022 1517   HGB 13.0 01/01/2016 0841   HCT 41.6 01/14/2022 1517   HCT 41.7 01/01/2016 0841   PLT 310 01/14/2022 1517   PLT 280 01/01/2016 0841   MCV 86.1 01/14/2022 1517   MCV 89 01/01/2016 0841   MCH 28.0 01/14/2022 1517   MCHC 32.5 01/14/2022 1517   RDW 14.4 01/14/2022 1517   RDW 13.9 01/01/2016 0841   LYMPHSABS 2.2 12/15/2021 1637   MONOABS 0.5 12/15/2021 1637   EOSABS 0.1 12/15/2021 1637   BASOSABS 0.1 12/15/2021 1637      Latest Ref Rng & Units 01/14/2022    3:17 PM 12/15/2021    4:37 PM 10/07/2021    9:42 AM  BMP  Glucose 70 - 99 mg/dL 99  88  99   BUN 6 - 20 mg/dL '12  14  11   '$ Creatinine 0.44 - 1.00 mg/dL 0.77  0.94  0.85   Sodium 135 - 145 mmol/L 139  141  138   Potassium 3.5 - 5.1 mmol/L 3.9  4.2  4.3   Chloride 98 - 111 mmol/L 106  103  104   CO2 22 - 32 mmol/L '24  27  30   '$ Calcium 8.9 - 10.3 mg/dL 9.8  9.4  9.6    Chest imaging: CXR 01/14/22 The heart size and mediastinal contours are within normal limits. Both lungs are clear. The visualized skeletal structures are unremarkable.  CTA Chest 04/23/21 Cardiovascular: Aorta normal caliber without aneurysm or dissection. Mild atherosclerotic calcifications at coronary arteries. Heart size normal. No pericardial effusion. Pulmonary arteries adequately opacified and patent. No evidence of pulmonary embolism.   Mediastinum/Nodes: Esophagus unremarkable. No thoracic adenopathy. Soft tissue nodule pretracheal at thoracic inlet, between the clavicular heads, 20 x 13 mm, unchanged since an earlier study of 03/09/2016, likely exophytic thyroid nodule less likely lymph node; Stability for greater than 5 years implies benignity; no biopsy or followup indicated (ref: J Am Coll Radiol. 2015 Feb;12(2): 143-50).   Lungs/Pleura: Lungs clear. No pulmonary infiltrate, pleural effusion, or  pneumothorax.   PFT:     No data to display          Labs:  Path:  Echo 12/2021: LV EF 55-60%. RV systolic function and size is normal.   Heart Catheterization:  Assessment & Plan:   Dyspnea on exertion - Plan: Pulmonary Function Test  Reactive airways dysfunction syndrome (HCC) - Plan: fluticasone-salmeterol (ADVAIR HFA) 115-21 MCG/ACT inhaler  Hypothyroidism due to Hashimoto's thyroiditis - Plan: Ambulatory referral to Endocrinology  Discussion: Kim Pham is a 41 year old woman, former smoker with GERD, SLE, and Hashimoto's Thyroiditis who is referred to pulmonary clinic for shortness of breath.   Her shortness of breath and wheezing are likely related to reactive airways disease. She is to start advair HFA 115-51mg 2 puffs twice daily. We will check pulmonary function tests and monitor for symptom improvement with ICS/LABA inhaler therapy.  We will refer her to endocrinology due to her recent diagnosis of hypothyroidism and thyroid nodule noted on CT chest scan.   Follow up in 2 months with pulmonary function tests.  Freda Jackson, MD [facility] Office: [phone]    Current Outpatient Medications:    benzonatate (TESSALON) 100 MG capsule, Take 1 capsule (100 mg total) by mouth 3 (three) times daily as needed for cough., Disp: 30 capsule, Rfl: 0   fluticasone (FLONASE) 50 MCG/ACT nasal spray, Place 2 sprays into both nostrils daily., Disp: 16 g, Rfl: 1   fluticasone-salmeterol (ADVAIR HFA) 115-21 MCG/ACT inhaler, Inhale 2 puffs into the lungs 2 (two) times daily., Disp: 1 each, Rfl: 12   gabapentin (NEURONTIN) 300 MG capsule, TAKE ONE CAPSULE BY MOUTH AT BEDTIME, Disp: 90 capsule, Rfl: 1   levothyroxine (SYNTHROID) 50 MCG tablet, Take 50 mcg by mouth daily before breakfast. Take 1.5 tablets each morning, Disp: , Rfl:    meloxicam (MOBIC) 15 MG tablet, Take 1 tablet (15 mg total) by mouth daily., Disp: 30 tablet, Rfl: 0    pantoprazole (PROTONIX) 40 MG tablet, Take 1 tablet (40 mg total) by mouth daily., Disp: 90 tablet, Rfl: 3   PARoxetine (PAXIL) 20 MG tablet, Take 1 tablet (20 mg total) by mouth at bedtime., Disp: 90 tablet, Rfl: 3   rosuvastatin (CRESTOR) 20 MG tablet, Take 1 tablet (20 mg total) by mouth daily., Disp: 90 tablet, Rfl: 3   SUMAtriptan (IMITREX) 100 MG tablet, Take 1 tablet (100 mg total) by mouth at onset of migraine. May repeat in 2 hours if headache persists or recurs. Do not take more than 2 doses in 24 hours., Disp: 10 tablet, Rfl: 2   traZODone (DESYREL) 100 MG tablet, TAKE TWO TABLETS BY MOUTH EVERY NIGHT AT BEDTIME, Disp: 180 tablet, Rfl: 1

## 2022-03-17 NOTE — Patient Instructions (Addendum)
2 puStart advair 2 puffs twice daily - rinse mouth out after each use  Use albuterol inhaler 1-2 puffs every 4-6 hours as needed  We will refer you to endocrinology for further evaluation of your thyroid disease   Follow up in 2 months with pulmonary function tests

## 2022-04-12 ENCOUNTER — Other Ambulatory Visit: Payer: Self-pay | Admitting: Family

## 2022-04-14 ENCOUNTER — Other Ambulatory Visit: Payer: Self-pay | Admitting: Family

## 2022-04-14 ENCOUNTER — Ambulatory Visit
Admission: RE | Admit: 2022-04-14 | Discharge: 2022-04-14 | Disposition: A | Discharge status: Home / Self Care | Payer: BC Managed Care – PPO | Source: Ambulatory Visit | Attending: Family | Admitting: Family

## 2022-04-14 DIAGNOSIS — N6452 Nipple discharge: Secondary | ICD-10-CM | POA: Diagnosis not present

## 2022-04-14 DIAGNOSIS — N6489 Other specified disorders of breast: Secondary | ICD-10-CM

## 2022-04-20 ENCOUNTER — Encounter: Payer: Self-pay | Admitting: Family

## 2022-04-20 DIAGNOSIS — Z87891 Personal history of nicotine dependence: Secondary | ICD-10-CM

## 2022-04-20 DIAGNOSIS — R0609 Other forms of dyspnea: Secondary | ICD-10-CM

## 2022-04-20 DIAGNOSIS — N6452 Nipple discharge: Secondary | ICD-10-CM

## 2022-04-20 DIAGNOSIS — R002 Palpitations: Secondary | ICD-10-CM | POA: Diagnosis not present

## 2022-04-20 DIAGNOSIS — R0789 Other chest pain: Secondary | ICD-10-CM

## 2022-04-20 HISTORY — DX: Personal history of nicotine dependence: Z87.891

## 2022-04-20 HISTORY — DX: Other chest pain: R07.89

## 2022-04-20 HISTORY — DX: Other forms of dyspnea: R06.09

## 2022-04-21 DIAGNOSIS — R635 Abnormal weight gain: Secondary | ICD-10-CM | POA: Diagnosis not present

## 2022-04-21 DIAGNOSIS — E039 Hypothyroidism, unspecified: Secondary | ICD-10-CM | POA: Diagnosis not present

## 2022-04-21 DIAGNOSIS — E271 Primary adrenocortical insufficiency: Secondary | ICD-10-CM | POA: Diagnosis not present

## 2022-04-21 DIAGNOSIS — E063 Autoimmune thyroiditis: Secondary | ICD-10-CM | POA: Diagnosis not present

## 2022-04-26 ENCOUNTER — Other Ambulatory Visit: Payer: Self-pay | Admitting: Family

## 2022-04-26 DIAGNOSIS — R1310 Dysphagia, unspecified: Secondary | ICD-10-CM

## 2022-04-27 ENCOUNTER — Ambulatory Visit: Payer: BC Managed Care – PPO | Admitting: Family

## 2022-04-29 ENCOUNTER — Encounter: Payer: Self-pay | Admitting: Family

## 2022-04-30 MED ORDER — LORAZEPAM 1 MG PO TABS: ORAL_TABLET | ORAL | 0 refills | Status: DC

## 2022-05-04 ENCOUNTER — Ambulatory Visit: Payer: BC Managed Care – PPO | Admitting: Family

## 2022-05-04 ENCOUNTER — Encounter: Payer: Self-pay | Admitting: Family

## 2022-05-04 ENCOUNTER — Ambulatory Visit (HOSPITAL_BASED_OUTPATIENT_CLINIC_OR_DEPARTMENT_OTHER)
Admission: RE | Admit: 2022-05-04 | Discharge: 2022-05-04 | Disposition: A | Discharge status: Home / Self Care | Payer: BC Managed Care – PPO | Source: Ambulatory Visit | Attending: Family | Admitting: Family

## 2022-05-04 DIAGNOSIS — E041 Nontoxic single thyroid nodule: Secondary | ICD-10-CM | POA: Insufficient documentation

## 2022-05-04 DIAGNOSIS — R1310 Dysphagia, unspecified: Secondary | ICD-10-CM

## 2022-05-05 ENCOUNTER — Ambulatory Visit: Payer: BC Managed Care – PPO | Admitting: Pulmonary Disease

## 2022-05-05 ENCOUNTER — Encounter: Payer: Self-pay | Admitting: Family

## 2022-05-07 ENCOUNTER — Telehealth: Payer: Self-pay | Admitting: Family

## 2022-05-07 ENCOUNTER — Ambulatory Visit (INDEPENDENT_AMBULATORY_CARE_PROVIDER_SITE_OTHER): Payer: BC Managed Care – PPO | Admitting: Family

## 2022-05-07 VITALS — BP 109/57 | HR 70 | Temp 97.8°F | Resp 16 | Wt 230.0 lb

## 2022-05-07 DIAGNOSIS — R490 Dysphonia: Secondary | ICD-10-CM

## 2022-05-07 DIAGNOSIS — E041 Nontoxic single thyroid nodule: Secondary | ICD-10-CM

## 2022-05-07 DIAGNOSIS — R1011 Right upper quadrant pain: Secondary | ICD-10-CM

## 2022-05-07 HISTORY — DX: Dysphonia: R49.0

## 2022-05-07 HISTORY — DX: Right upper quadrant pain: R10.11

## 2022-05-07 MED ORDER — PANTOPRAZOLE SODIUM 40 MG PO TBEC: 40.0000 mg | DELAYED_RELEASE_TABLET | Freq: Two times a day (BID) | ORAL | 0 refills | Status: DC

## 2022-05-07 NOTE — Telephone Encounter (Signed)
Kim Pham from [facility] calling to get authorization on MRI that is scheduled for tomorrow. Information must be received by 2pm today or patient will be canceled. Please call her back at [phone]

## 2022-05-07 NOTE — Progress Notes (Signed)
Subjective:   By signing my name below, I, Shehryar Baig, attest that this documentation has been prepared under the direction and in the presence of Debbrah Alar, NP. 05/07/2022   Patient ID: Kim Pham, female    DOB: Sep 01, 1980, 42 y.o.   MRN: [id number]  Chief Complaint  Patient presents with   Thyroid Nodule    Here for follow up after [place]    HPI Patient is in today for a follow up visit.   Hoarse voice: She continues having frequent hoarse voice. She notes it has been happening more frequently. She is trying to drink hot tea to manage her symptoms and finds no relief. She recently started having food and pills get stuck in her throat. She continues taking pantoprazole and reports no change in her symptoms. She has followed up with a GI specialist and received an endoscopy and results were normal. She is interested in seeing a ENT specialist.   Abdominal pain: She reports having right upper quadrant pain earlier this month. She still has her gallbladder at this time. She is following up with her GI specialist for this issue.   Thyroid: Her last thyroid ultra sound was 05/04/2022. Results showed Mildly enlarged and moderately heterogeneous thyroid gland. A 0.9 cm right mid thyroid nodule does not meet criteria for biopsy or follow-up. A conglomerate area likely representing a cluster of nodules measuring 2.4 cm meets criteria for follow-up ultrasound in 1 year.   Past Medical History:  Diagnosis Date   Adnexal mass 02/24/2021   in epic seen on [place]   Amenorrhea 08/14/2021   Anxiety 08/14/2021   Arthralgia 08/18/2016   Chondromalacia, patella 06/30/2021   Chronic constipation 08/14/2021   Chronic headaches HISTORY OF MIGRAINES 08/14/2021   Chronic migraine without aura, with intractable migraine, so stated, with status migrainosus 22/48/2500   Complication of anesthesia 08/14/2021   PONV   Cyst of left ovary 06/17/2021   Degenerative disc disease, lumbar 09/04/2019    Depression with anxiety 03/26/2016   Diarrhea 07/08/2017   Effusion, left knee 12/16/2021   Endometriosis    Fatigue 03/26/2016   Gastroesophageal reflux disease 08/19/2020   Hair loss 11/22/2016   High risk medication use 11/22/2016   history of Chiari malformation    History of Chiari malformation 11/22/2016   history of COVID 02/2021   LOW GRADE FEVER CONGESTION AND HEADACHE ALL SYMPTOMS RESOLVED   Hot flashes 06/11/2021   Hyperlipidemia    Insomnia 08/19/2020   Low blood pressure    pt states history of low blood pressure-fainted 12/2010-instructed to move slowly   Nonepileptic episode ([place]) 12/22/2016   Numbness and tingling 02/25/2016   OSTEOARTHRITIS 08/14/2021   Other epilepsy, intractable, without status epilepticus ([place]) 11/01/2016   Pain in right knee 11/13/2020   Photosensitivity 11/22/2016   Psychogenic nonepileptic seizure 07/27/2017   Raynaud's disease without gangrene 11/22/2016   Rectal bleeding 06/11/2021   Right lower quadrant abdominal pain 06/05/2021   Sacroiliac joint dysfunction of right side 12/19/2019   Sicca syndrome ([place]) 11/22/2016   SLE (systemic lupus erythematosus) ([place]) 03/26/2016   Subclinical hypothyroidism    Thyroid nodule    Transient alteration of awareness 01/01/2016   Wears glasses for reading 08/14/2021    Past Surgical History:  Procedure Laterality Date   ABDOMINAL HYSTERECTOMY  2007   CYSTOSCOPY  2007   WITH HYSTERECTOMY   DIAGNOSTIC LAPAROSCOPY  2005   OOPHORECTOMY Left 08/18/2021   ROTATOR CUFF REPAIR Right 2010   SALPINGOOPHORECTOMY  04/16/2011   Procedure: SALPINGO OOPHERECTOMY;  Surgeon: Lovenia Kim, MD;  Location: [facility];  Service: Gynecology;  Laterality: Right;   skull decompression surgery 2005     @ [facility] History  Problem Relation Age of Onset   Hypertension Mother    CAD Mother    Breast cancer Mother    Migraines Mother    Gout Mother    Heart disease Mother    Crohn's disease  Mother    Hypertension Father    CAD Father    CVA Father    Heart disease Father    Colon polyps Father    Liver disease Father    Sjogren's syndrome Sister    Celiac disease Sister    Rheum arthritis Maternal Grandmother    Dementia Maternal Grandmother    Pancreatic cancer Maternal Grandfather    CAD Paternal Grandmother    Macular degeneration Paternal Grandmother    CVA Paternal Grandfather    Autism Son    ADD / ADHD Son    Breast cancer Maternal Aunt    Esophageal cancer Neg Hx    Colon cancer Neg Hx    Stomach cancer Neg Hx     Social History   Socioeconomic History   Marital status: Married    Spouse name: Not on file   Number of children: 1   Years of education: Assoc   Highest education level: Not on file  Occupational History   Occupation: nurse  Tobacco Use   Smoking status: Former    Packs/day: 1.00    Years: 10.00    Total pack years: 10.00    Types: Cigarettes    Quit date: 04/04/2004    Years since quitting: 18.1    Passive exposure: Past   Smokeless tobacco: Never  Vaping Use   Vaping Use: Never used  Substance and Sexual Activity   Alcohol use: Not Currently   Drug use: Never   Sexual activity: Yes    Birth control/protection: Surgical  Other Topics Concern   Not on file  Social History Narrative   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 30 capsule 0    fluticasone (FLONASE) 50 MCG/ACT nasal spray Place 2 sprays into both nostrils daily. 16 g 1   fluticasone-salmeterol (ADVAIR HFA) 115-21 MCG/ACT inhaler Inhale 2 puffs into the lungs 2 (two) times daily. 1 each 12   gabapentin (NEURONTIN) 300 MG capsule TAKE ONE CAPSULE BY MOUTH AT BEDTIME 90 capsule 1   levothyroxine (SYNTHROID) 50 MCG tablet Take 50 mcg by mouth daily before breakfast. Take 1.5 tablets each morning     LORazepam (ATIVAN) 1 MG tablet Take 1 tablet by mouth 30 minutes prior to MRI 1 tablet 0   meloxicam (MOBIC) 15 MG tablet  Take 1 tablet (15 mg total) by mouth daily. 30 tablet 0   PARoxetine (PAXIL) 20 MG tablet Take 1 tablet (20 mg total) by mouth at bedtime. 90 tablet 3   rosuvastatin (CRESTOR) 20 MG tablet Take 1 tablet (20 mg total) by mouth daily. 90 tablet 3   SUMAtriptan (IMITREX) 100 MG tablet Take 1 tablet (100 mg total) by mouth at onset of migraine. May repeat in 2 hours if headache persists or recurs. Do not take more than 2 doses in 24 hours. 10 tablet 2   traZODone (DESYREL) 100 MG tablet TAKE TWO TABLETS BY MOUTH EVERY NIGHT AT BEDTIME 180 tablet 1   pantoprazole (PROTONIX) 40 MG tablet Take 1 tablet (40 mg total) by mouth daily. 90 tablet 3   No facility-administered medications prior to visit.    Allergies  Allergen Reactions   Effexor [Venlafaxine] Nausea Only    Rash    Tramadol Nausea And Vomiting   Norco [Hydrocodone-Acetaminophen] Rash   Voltaren [Diclofenac] Rash    Review of Systems  HENT:         (+)hoarse voice       Objective:    Physical Exam Constitutional:      General: She is not in acute distress.    Appearance: Normal appearance. She is not ill-appearing.  HENT:     Head: Normocephalic and atraumatic.     Right Ear: External ear normal.     Left Ear: External ear normal.  Eyes:     Extraocular Movements: Extraocular movements intact.     Pupils: Pupils are equal, round, and reactive to light.  Neck:     Comments: Mild  right thyroid fullness Cardiovascular:     Rate and Rhythm: Normal rate and regular rhythm.     Heart sounds: Normal heart sounds. No murmur heard.    No gallop.  Pulmonary:     Effort: Pulmonary effort is normal. No respiratory distress.     Breath sounds: Normal breath sounds. No wheezing or rales.  Abdominal:     General: Bowel sounds are normal.     Palpations: Abdomen is soft.     Tenderness: There is abdominal tenderness in the right upper quadrant and epigastric area. There is no guarding.  Skin:    General: Skin is warm and dry.  Neurological:     Mental Status: She is alert and oriented to person, place, and time.  Psychiatric:        Judgment: Judgment normal.     BP (!) 109/57 (BP Location: Right Arm, Patient Position: Sitting, Cuff Size: Large)   Pulse 70   Temp 97.8 F (36.6 C) (Oral)   Resp 16   Wt 230 lb (104.3 kg)   SpO2 98%   BMI 33.97 kg/m  Wt Readings from Last 3 Encounters:  05/07/22 230 lb (104.3 kg)  03/17/22 230 lb 6.4 oz (104.5 kg)  01/14/22 225 lb (102.1 kg)       Assessment & Plan:  Right upper quadrant pain Assessment & Plan: New. Suspect gallbladder cause. Check [place] for further evaluation.   Orders: -     US ABDOMEN LIMITED RUQ (LIVER/GB); Future  Thyroid nodule Assessment & Plan: Reassurance provided. Agree with radiology recommendations for 1 year repeat US.  Lab Results  Component Value Date   TSH 4.051 01/14/2022      Voice hoarseness Assessment & Plan: Suspect her voice hoarseness and swallowing issues are gerd related.  She did have endoscopy over the  summer which did not show any esophageal stricture. Will increase pantoprazole to bid.  Will also refer to ENT for further evaluation.   Orders: -     Ambulatory referral to ENT  Other orders -     Pantoprazole Sodium; Take 1 tablet (40 mg total) by mouth 2 (two) times daily.  Dispense: 180 tablet; Refill: 0    I, Nance Pear, NP, personally preformed the  services described in this documentation.  All medical record entries made by the scribe were at my direction and in my presence.  I have reviewed the chart and discharge instructions (if applicable) and agree that the record reflects my personal performance and is accurate and complete. 05/07/2022   I,Shehryar Baig,acting as a scribe for Nance Pear, NP.,have documented all relevant documentation on the behalf of Nance Pear, NP,as directed by  Nance Pear, NP while in the presence of Nance Pear, NP.   Nance Pear, NP

## 2022-05-07 NOTE — Assessment & Plan Note (Addendum)
Suspect her voice hoarseness and swallowing issues are gerd related.  She did have endoscopy over the summer which did not show any esophageal stricture. Will increase pantoprazole to bid.  Will also refer to ENT for further evaluation.

## 2022-05-07 NOTE — Telephone Encounter (Signed)
[facility] is calling back to get a PA started. Please advise.

## 2022-05-07 NOTE — Telephone Encounter (Signed)
great

## 2022-05-07 NOTE — Assessment & Plan Note (Signed)
New. Suspect gallbladder cause. Check [place] for further evaluation.

## 2022-05-07 NOTE — Assessment & Plan Note (Signed)
Reassurance provided. Agree with radiology recommendations for 1 year repeat US.  Lab Results  Component Value Date   TSH 4.051 01/14/2022

## 2022-05-08 ENCOUNTER — Other Ambulatory Visit: Payer: BC Managed Care – PPO

## 2022-05-08 ENCOUNTER — Ambulatory Visit
Admission: RE | Admit: 2022-05-08 | Discharge: 2022-05-08 | Disposition: A | Discharge status: Home / Self Care | Payer: BC Managed Care – PPO | Source: Ambulatory Visit | Attending: Family | Admitting: Family

## 2022-05-08 DIAGNOSIS — N6452 Nipple discharge: Secondary | ICD-10-CM | POA: Diagnosis not present

## 2022-05-08 MED ORDER — GADOPICLENOL 0.5 MMOL/ML IV SOLN
10.0000 mL | Freq: Once | INTRAVENOUS | Status: AC | PRN
  Administered 2022-05-08: 10 mL via INTRAVENOUS

## 2022-05-10 ENCOUNTER — Encounter (HOSPITAL_BASED_OUTPATIENT_CLINIC_OR_DEPARTMENT_OTHER): Payer: Self-pay | Admitting: Emergency Medicine

## 2022-05-10 ENCOUNTER — Other Ambulatory Visit: Payer: Self-pay

## 2022-05-10 ENCOUNTER — Observation Stay (HOSPITAL_BASED_OUTPATIENT_CLINIC_OR_DEPARTMENT_OTHER)
Admission: RE | Admit: 2022-05-10 | Discharge: 2022-05-10 | Disposition: A | Discharge status: Home / Self Care | Payer: BC Managed Care – PPO | Source: Ambulatory Visit | Attending: Family | Admitting: Family

## 2022-05-10 ENCOUNTER — Encounter: Payer: Self-pay | Admitting: Family

## 2022-05-10 ENCOUNTER — Emergency Department (HOSPITAL_BASED_OUTPATIENT_CLINIC_OR_DEPARTMENT_OTHER)
Admission: EM | Admit: 2022-05-10 | Discharge: 2022-05-10 | Discharge status: Against Medical Advice | Payer: BC Managed Care – PPO | Attending: Emergency Medicine | Admitting: Emergency Medicine

## 2022-05-10 DIAGNOSIS — R110 Nausea: Secondary | ICD-10-CM | POA: Insufficient documentation

## 2022-05-10 DIAGNOSIS — Z5321 Procedure and treatment not carried out due to patient leaving prior to being seen by health care provider: Secondary | ICD-10-CM | POA: Diagnosis not present

## 2022-05-10 DIAGNOSIS — R1011 Right upper quadrant pain: Secondary | ICD-10-CM | POA: Insufficient documentation

## 2022-05-10 DIAGNOSIS — K828 Other specified diseases of gallbladder: Secondary | ICD-10-CM | POA: Diagnosis not present

## 2022-05-10 DIAGNOSIS — K824 Cholesterolosis of gallbladder: Secondary | ICD-10-CM | POA: Diagnosis not present

## 2022-05-10 LAB — URINALYSIS, ROUTINE W REFLEX MICROSCOPIC
Bilirubin Urine: NEGATIVE
Glucose, UA: NEGATIVE mg/dL
Hgb urine dipstick: NEGATIVE
Ketones, ur: NEGATIVE mg/dL
Leukocytes,Ua: NEGATIVE
Nitrite: NEGATIVE
Protein, ur: NEGATIVE mg/dL
Specific Gravity, Urine: <=1.005 (ref 1.005–1.030)
pH: 5.5 (ref 5.0–8.0)

## 2022-05-10 LAB — CBC
HCT: 41 % (ref 36.0–46.0)
Hemoglobin: 13.4 g/dL (ref 12.0–15.0)
MCH: 28.7 pg (ref 26.0–34.0)
MCHC: 32.7 g/dL (ref 30.0–36.0)
MCV: 87.8 fL (ref 80.0–100.0)
Platelets: 295 10*3/uL (ref 150–400)
RBC: 4.67 MIL/uL (ref 3.87–5.11)
RDW: 13.1 % (ref 11.5–15.5)
WBC: 5.6 10*3/uL (ref 4.0–10.5)
nRBC: 0 % (ref 0.0–0.2)

## 2022-05-10 LAB — COMPREHENSIVE METABOLIC PANEL
ALT: 37 U/L (ref 0–44)
AST: 32 U/L (ref 15–41)
Albumin: 4.3 g/dL (ref 3.5–5.0)
Alkaline Phosphatase: 70 U/L (ref 38–126)
Anion gap: 12 (ref 5–15)
BUN: 11 mg/dL (ref 6–20)
CO2: 23 mmol/L (ref 22–32)
Calcium: 9.1 mg/dL (ref 8.9–10.3)
Chloride: 105 mmol/L (ref 98–111)
Creatinine, Ser: 0.69 mg/dL (ref 0.44–1.00)
GFR, Estimated: >60 mL/min (ref >60)
Glucose, Bld: 123 mg/dL — ABNORMAL HIGH (ref 70–99)
Potassium: 3.5 mmol/L (ref 3.5–5.1)
Sodium: 140 mmol/L (ref 135–145)
Total Bilirubin: 0.4 mg/dL (ref 0.3–1.2)
Total Protein: 7.5 g/dL (ref 6.5–8.1)

## 2022-05-10 LAB — PREGNANCY, URINE: Preg Test, Ur: NEGATIVE

## 2022-05-10 LAB — LIPASE, BLOOD: Lipase: 42 U/L (ref 11–51)

## 2022-05-10 MED ORDER — ONDANSETRON 4 MG PO TBDP
4.0000 mg | ORAL_TABLET | Freq: Once | ORAL | Status: AC | PRN
  Administered 2022-05-10: 4 mg via ORAL
  Filled 2022-05-10: qty 1

## 2022-05-10 NOTE — ED Triage Notes (Signed)
Patient arrived via POV c/o RUQ abdominal pain x 1 day. Patient had [place] today at this facility and told to return if pain worse. Patient states now radiating to right flank as well. Patient states 7/10 pain. Patient does endorse nausea. Patient is AO x 4, VS WDL, normal gait.

## 2022-05-11 ENCOUNTER — Observation Stay (HOSPITAL_BASED_OUTPATIENT_CLINIC_OR_DEPARTMENT_OTHER)
Admission: EM | Admit: 2022-05-11 | Discharge: 2022-05-13 | Disposition: A | Discharge status: Home / Self Care | Payer: BC Managed Care – PPO | Attending: Internal Medicine | Admitting: Internal Medicine

## 2022-05-11 ENCOUNTER — Encounter (HOSPITAL_COMMUNITY): Payer: Self-pay

## 2022-05-11 ENCOUNTER — Encounter (HOSPITAL_BASED_OUTPATIENT_CLINIC_OR_DEPARTMENT_OTHER): Payer: Self-pay | Admitting: Emergency Medicine

## 2022-05-11 ENCOUNTER — Emergency Department (HOSPITAL_BASED_OUTPATIENT_CLINIC_OR_DEPARTMENT_OTHER): Payer: BC Managed Care – PPO

## 2022-05-11 ENCOUNTER — Telehealth: Payer: Self-pay | Admitting: Family

## 2022-05-11 ENCOUNTER — Other Ambulatory Visit: Payer: Self-pay

## 2022-05-11 DIAGNOSIS — Z79899 Other long term (current) drug therapy: Secondary | ICD-10-CM | POA: Insufficient documentation

## 2022-05-11 DIAGNOSIS — E669 Obesity, unspecified: Secondary | ICD-10-CM | POA: Diagnosis present

## 2022-05-11 DIAGNOSIS — I2699 Other pulmonary embolism without acute cor pulmonale: Secondary | ICD-10-CM | POA: Diagnosis not present

## 2022-05-11 DIAGNOSIS — E02 Subclinical iodine-deficiency hypothyroidism: Secondary | ICD-10-CM | POA: Insufficient documentation

## 2022-05-11 DIAGNOSIS — R079 Chest pain, unspecified: Secondary | ICD-10-CM | POA: Diagnosis not present

## 2022-05-11 DIAGNOSIS — I878 Other specified disorders of veins: Secondary | ICD-10-CM | POA: Insufficient documentation

## 2022-05-11 DIAGNOSIS — R197 Diarrhea, unspecified: Secondary | ICD-10-CM | POA: Insufficient documentation

## 2022-05-11 DIAGNOSIS — M329 Systemic lupus erythematosus, unspecified: Secondary | ICD-10-CM | POA: Diagnosis present

## 2022-05-11 DIAGNOSIS — E038 Other specified hypothyroidism: Secondary | ICD-10-CM | POA: Diagnosis present

## 2022-05-11 DIAGNOSIS — K381 Appendicular concretions: Secondary | ICD-10-CM | POA: Diagnosis not present

## 2022-05-11 DIAGNOSIS — R109 Unspecified abdominal pain: Secondary | ICD-10-CM | POA: Diagnosis not present

## 2022-05-11 DIAGNOSIS — K824 Cholesterolosis of gallbladder: Secondary | ICD-10-CM | POA: Insufficient documentation

## 2022-05-11 DIAGNOSIS — R1011 Right upper quadrant pain: Secondary | ICD-10-CM | POA: Diagnosis not present

## 2022-05-11 DIAGNOSIS — D72819 Decreased white blood cell count, unspecified: Secondary | ICD-10-CM | POA: Insufficient documentation

## 2022-05-11 DIAGNOSIS — Z87891 Personal history of nicotine dependence: Secondary | ICD-10-CM | POA: Insufficient documentation

## 2022-05-11 DIAGNOSIS — R9431 Abnormal electrocardiogram [ECG] [EKG]: Secondary | ICD-10-CM | POA: Diagnosis not present

## 2022-05-11 LAB — LIPASE, BLOOD: Lipase: 40 U/L (ref 11–51)

## 2022-05-11 LAB — CBC WITH DIFFERENTIAL/PLATELET
Abs Immature Granulocytes: 0.01 10*3/uL (ref 0.00–0.07)
Basophils Absolute: 0.1 10*3/uL (ref 0.0–0.1)
Basophils Relative: 2 %
Eosinophils Absolute: 0.1 10*3/uL (ref 0.0–0.5)
Eosinophils Relative: 3 %
HCT: 42 % (ref 36.0–46.0)
Hemoglobin: 13.7 g/dL (ref 12.0–15.0)
Immature Granulocytes: 0 %
Lymphocytes Relative: 38 %
Lymphs Abs: 1.6 10*3/uL (ref 0.7–4.0)
MCH: 28.8 pg (ref 26.0–34.0)
MCHC: 32.6 g/dL (ref 30.0–36.0)
MCV: 88.2 fL (ref 80.0–100.0)
Monocytes Absolute: 0.2 10*3/uL (ref 0.1–1.0)
Monocytes Relative: 6 %
Neutro Abs: 2.1 10*3/uL (ref 1.7–7.7)
Neutrophils Relative %: 51 %
Platelets: 292 10*3/uL (ref 150–400)
RBC: 4.76 MIL/uL (ref 3.87–5.11)
RDW: 13.2 % (ref 11.5–15.5)
WBC: 4.1 10*3/uL (ref 4.0–10.5)
nRBC: 0 % (ref 0.0–0.2)

## 2022-05-11 LAB — TROPONIN I (HIGH SENSITIVITY)
Troponin I (High Sensitivity): 2 ng/L (ref <18)
Troponin I (High Sensitivity): 3 ng/L (ref <18)

## 2022-05-11 LAB — URINALYSIS, ROUTINE W REFLEX MICROSCOPIC
Bilirubin Urine: NEGATIVE
Glucose, UA: NEGATIVE mg/dL
Hgb urine dipstick: NEGATIVE
Ketones, ur: NEGATIVE mg/dL
Nitrite: NEGATIVE
Protein, ur: NEGATIVE mg/dL
Specific Gravity, Urine: 1.01 (ref 1.005–1.030)
pH: 7 (ref 5.0–8.0)

## 2022-05-11 LAB — PHOSPHORUS: Phosphorus: 4.8 mg/dL — ABNORMAL HIGH (ref 2.5–4.6)

## 2022-05-11 LAB — MAGNESIUM: Magnesium: 2 mg/dL (ref 1.7–2.4)

## 2022-05-11 LAB — URINALYSIS, MICROSCOPIC (REFLEX)

## 2022-05-11 LAB — BRAIN NATRIURETIC PEPTIDE: B Natriuretic Peptide: 15.6 pg/mL (ref 0.0–100.0)

## 2022-05-11 MED ORDER — OXYCODONE HCL 5 MG PO TABS
5.0000 mg | ORAL_TABLET | Freq: Four times a day (QID) | ORAL | Status: DC | PRN
  Administered 2022-05-12: 5 mg via ORAL
  Filled 2022-05-11: qty 1

## 2022-05-11 MED ORDER — GABAPENTIN 100 MG PO CAPS
300.0000 mg | ORAL_CAPSULE | Freq: Every day | ORAL | Status: DC
  Administered 2022-05-11 – 2022-05-12 (×2): 300 mg via ORAL
  Filled 2022-05-11 (×2): qty 3

## 2022-05-11 MED ORDER — IOHEXOL 350 MG/ML SOLN
100.0000 mL | Freq: Once | INTRAVENOUS | Status: AC | PRN
  Administered 2022-05-11: 80 mL via INTRAVENOUS

## 2022-05-11 MED ORDER — HEPARIN BOLUS VIA INFUSION
6000.0000 [IU] | Freq: Once | INTRAVENOUS | Status: AC
  Administered 2022-05-11: 6000 [IU] via INTRAVENOUS

## 2022-05-11 MED ORDER — MORPHINE SULFATE (PF) 4 MG/ML IV SOLN
4.0000 mg | Freq: Once | INTRAVENOUS | Status: AC
  Administered 2022-05-11: 4 mg via INTRAVENOUS
  Filled 2022-05-11: qty 1

## 2022-05-11 MED ORDER — OXYCODONE-ACETAMINOPHEN 5-325 MG PO TABS: 1.0000 | ORAL_TABLET | Freq: Four times a day (QID) | ORAL | 0 refills | Status: DC | PRN

## 2022-05-11 MED ORDER — FENTANYL CITRATE PF 50 MCG/ML IJ SOSY
50.0000 ug | PREFILLED_SYRINGE | Freq: Once | INTRAMUSCULAR | Status: AC
  Administered 2022-05-11: 50 ug via INTRAVENOUS
  Filled 2022-05-11: qty 1

## 2022-05-11 MED ORDER — ALBUTEROL SULFATE (2.5 MG/3ML) 0.083% IN NEBU: 2.5000 mg | INHALATION_SOLUTION | RESPIRATORY_TRACT | Status: DC | PRN

## 2022-05-11 MED ORDER — SODIUM CHLORIDE 0.9 % IV SOLN: INTRAVENOUS | Status: DC

## 2022-05-11 MED ORDER — MORPHINE SULFATE (PF) 2 MG/ML IV SOLN
2.0000 mg | INTRAVENOUS | Status: DC | PRN
  Administered 2022-05-11 – 2022-05-13 (×6): 2 mg via INTRAVENOUS
  Filled 2022-05-11 (×7): qty 1

## 2022-05-11 MED ORDER — DICYCLOMINE HCL 10 MG/ML IM SOLN
20.0000 mg | Freq: Once | INTRAMUSCULAR | Status: AC
  Administered 2022-05-11: 20 mg via INTRAMUSCULAR
  Filled 2022-05-11: qty 2

## 2022-05-11 MED ORDER — LEVOTHYROXINE SODIUM 75 MCG PO TABS
37.5000 ug | ORAL_TABLET | Freq: Every day | ORAL | Status: DC
  Administered 2022-05-12 – 2022-05-13 (×2): 37.5 ug via ORAL
  Filled 2022-05-11 (×2): qty 1

## 2022-05-11 MED ORDER — ACETAMINOPHEN 325 MG PO TABS: 650.0000 mg | ORAL_TABLET | Freq: Four times a day (QID) | ORAL | Status: DC | PRN

## 2022-05-11 MED ORDER — ACETAMINOPHEN 650 MG RE SUPP: 650.0000 mg | Freq: Four times a day (QID) | RECTAL | Status: DC | PRN

## 2022-05-11 MED ORDER — IOHEXOL 300 MG/ML  SOLN
100.0000 mL | Freq: Once | INTRAMUSCULAR | Status: AC | PRN
  Administered 2022-05-11: 100 mL via INTRAVENOUS

## 2022-05-11 MED ORDER — PAROXETINE HCL 20 MG PO TABS
20.0000 mg | ORAL_TABLET | Freq: Every day | ORAL | Status: DC
  Administered 2022-05-11 – 2022-05-12 (×2): 20 mg via ORAL
  Filled 2022-05-11 (×2): qty 1

## 2022-05-11 MED ORDER — ONDANSETRON HCL 4 MG/2ML IJ SOLN
4.0000 mg | Freq: Once | INTRAMUSCULAR | Status: AC
  Administered 2022-05-11: 4 mg via INTRAVENOUS
  Filled 2022-05-11: qty 2

## 2022-05-11 MED ORDER — TRAZODONE HCL 100 MG PO TABS
200.0000 mg | ORAL_TABLET | Freq: Every day | ORAL | Status: DC
  Administered 2022-05-11 – 2022-05-12 (×2): 200 mg via ORAL
  Filled 2022-05-11 (×2): qty 2

## 2022-05-11 MED ORDER — LIOTHYRONINE SODIUM 5 MCG PO TABS
10.0000 ug | ORAL_TABLET | Freq: Every day | ORAL | Status: DC
  Administered 2022-05-12 – 2022-05-13 (×2): 10 ug via ORAL
  Filled 2022-05-11 (×2): qty 2

## 2022-05-11 MED ORDER — HEPARIN (PORCINE) 25000 UT/250ML-% IV SOLN
1600.0000 [IU]/h | INTRAVENOUS | Status: DC
  Administered 2022-05-11: 1600 [IU]/h via INTRAVENOUS
  Filled 2022-05-11: qty 250

## 2022-05-11 NOTE — Subjective & Objective (Signed)
Presents with Right flank pain and N/V [place] 2 days ago showed Gall bladder wall thickening Pain worse with eating Her pain has gotten severe last night with green stool N/V Temp 100.6 She took some tylenol but with no improvemnt

## 2022-05-11 NOTE — Assessment & Plan Note (Signed)
Check TSH continue Synthroid 37.5 micrograms p.o. daily continue Cytomel 10 mcg p.o. daily

## 2022-05-11 NOTE — ED Provider Notes (Signed)
[facility] Provider Note   CSN: [id number] Arrival date & time: 05/11/22  0957     History {Add pertinent medical, surgical, social history, OB history to HPI:1} Chief Complaint  Patient presents with   Flank Pain    right    Kim Pham is a 42 y.o. female.  HPI     42 year old female with a history of SLE, Chiari malformation, depression, migraines, hyperlipidemia, who presents with concern for right-sided abdominal and flank pain.   Reports that she had some minor symptoms last week.  Reports that it was present at times after eating, but, other times as well.  At that time is mild and coming and going.  She had talked about it with her primary care doctor last week who ordered an outpatient ultrasound which was performed yesterday.  Yesterday she had an ultrasound that showed a thickened gallbladder wall, likely contracted gallbladder with polyps.  She developed severe right upper quadrant and flank pain yesterday, which has been constant and sharp.  Reports associated nausea, vomiting.  Did have loose green-colored stool today.  Otherwise denies constipation.  Reports she had a temperature of 100.6 while she was at home.  Denies shortness of breath, chest pain, cough.  Past Medical History:  Diagnosis Date   Adnexal mass 02/24/2021   in epic seen on [place]   Amenorrhea 08/14/2021   Anxiety 08/14/2021   Arthralgia 08/18/2016   Chondromalacia, patella 06/30/2021   Chronic constipation 08/14/2021   Chronic headaches HISTORY OF MIGRAINES 08/14/2021   Chronic migraine without aura, with intractable migraine, so stated, with status migrainosus 58/52/7782   Complication of anesthesia 08/14/2021   PONV   Cyst of left ovary 06/17/2021   Degenerative disc disease, lumbar 09/04/2019   Depression with anxiety 03/26/2016   Diarrhea 07/08/2017   Effusion, left knee 12/16/2021   Endometriosis    Fatigue 03/26/2016   Gastroesophageal  reflux disease 08/19/2020   Hair loss 11/22/2016   High risk medication use 11/22/2016   history of Chiari malformation    History of Chiari malformation 11/22/2016   history of COVID 02/2021   LOW GRADE FEVER CONGESTION AND HEADACHE ALL SYMPTOMS RESOLVED   Hot flashes 06/11/2021   Hyperlipidemia    Insomnia 08/19/2020   Low blood pressure    pt states history of low blood pressure-fainted 12/2010-instructed to move slowly   Nonepileptic episode ([place]) 12/22/2016   Numbness and tingling 02/25/2016   OSTEOARTHRITIS 08/14/2021   Other epilepsy, intractable, without status epilepticus ([place]) 11/01/2016   Pain in right knee 11/13/2020   Photosensitivity 11/22/2016   Psychogenic nonepileptic seizure 07/27/2017   Raynaud's disease without gangrene 11/22/2016   Rectal bleeding 06/11/2021   Right lower quadrant abdominal pain 06/05/2021   Sacroiliac joint dysfunction of right side 12/19/2019   Sicca syndrome ([place]) 11/22/2016   SLE (systemic lupus erythematosus) ([place]) 03/26/2016   Subclinical hypothyroidism    Thyroid nodule    Transient alteration of awareness 01/01/2016   Wears glasses for reading 08/14/2021     Home Medications Prior to Admission medications   Medication Sig Start Date End Date Taking? Authorizing Provider  benzonatate (TESSALON) 100 MG capsule Take 1 capsule (100 mg total) by mouth 3 (three) times daily as needed for cough. 02/17/22   Saguier, Percell Miller, PA-C  fluticasone (FLONASE) 50 MCG/ACT nasal spray Place 2 sprays into both nostrils daily. 02/17/22   Saguier, Percell Miller, PA-C  fluticasone-salmeterol (ADVAIR HFA) 423-53 MCG/ACT inhaler Inhale 2 puffs  into the lungs 2 (two) times daily. 03/17/22   Freddi Starr, MD  gabapentin (NEURONTIN) 300 MG capsule TAKE ONE CAPSULE BY MOUTH AT BEDTIME 11/26/21   Debbrah Alar, NP  levothyroxine (SYNTHROID) 50 MCG tablet Take 50 mcg by mouth daily before breakfast. Take 1.5 tablets each morning    [provider]   LORazepam (ATIVAN) 1 MG tablet Take 1 tablet by mouth 30 minutes prior to MRI 04/30/22   Debbrah Alar, NP  meloxicam (MOBIC) 15 MG tablet Take 1 tablet (15 mg total) by mouth daily. 12/11/21   Marrian Salvage, FNP  oxyCODONE-acetaminophen (PERCOCET) 5-325 MG tablet Take 1 tablet by mouth every 6 (six) hours as needed for severe pain. 05/11/22   Debbrah Alar, NP  pantoprazole (PROTONIX) 40 MG tablet Take 1 tablet (40 mg total) by mouth 2 (two) times daily. 05/07/22   Debbrah Alar, NP  PARoxetine (PAXIL) 20 MG tablet Take 1 tablet (20 mg total) by mouth at bedtime. 08/13/21   Truett Mainland, DO  rosuvastatin (CRESTOR) 20 MG tablet Take 1 tablet (20 mg total) by mouth daily. 12/28/21 12/23/22  Revankar, Reita Cliche, MD  SUMAtriptan (IMITREX) 100 MG tablet Take 1 tablet (100 mg total) by mouth at onset of migraine. May repeat in 2 hours if headache persists or recurs. Do not take more than 2 doses in 24 hours. 06/29/17   Melvenia Beam, MD  traZODone (DESYREL) 100 MG tablet TAKE TWO TABLETS BY MOUTH EVERY NIGHT AT BEDTIME 04/13/22   Debbrah Alar, NP      Allergies    Effexor [venlafaxine], Tramadol, Norco [hydrocodone-acetaminophen], and Voltaren [diclofenac]    Review of Systems   Review of Systems  Physical Exam Updated Vital Signs BP 136/88   Pulse 70   Temp 98 F (36.7 C) (Oral)   Resp 20   SpO2 97%  Physical Exam  ED Results / Procedures / Treatments   Labs (all labs ordered are listed, but only abnormal results are displayed) Labs Reviewed  COMPREHENSIVE METABOLIC PANEL - Abnormal; Notable for the following components:      Result Value   Glucose, Bld 102 (*)    All other components within normal limits  CBC WITH DIFFERENTIAL/PLATELET  LIPASE, BLOOD  URINALYSIS, ROUTINE W REFLEX MICROSCOPIC    EKG None  Radiology CT ABDOMEN PELVIS W CONTRAST  Result Date: 05/11/2022 CLINICAL DATA:  Abdominal/flank pain, stone suspected. Right upper quadrant  pain and vomiting since yesterday. EXAM: CT ABDOMEN AND PELVIS WITH CONTRAST TECHNIQUE: Multidetector CT imaging of the abdomen and pelvis was performed using the standard protocol following bolus administration of intravenous contrast. RADIATION DOSE REDUCTION: This exam was performed according to the departmental dose-optimization program which includes automated exposure control, adjustment of the mA and/or kV according to patient size and/or use of iterative reconstruction technique. CONTRAST:  126m OMNIPAQUE IOHEXOL 300 MG/ML  SOLN COMPARISON:  CT abdomen/pelvis 06/05/2021. Right upper quadrant ultrasound 05/10/2022. FINDINGS: Lower chest: Clear lung bases.  No pleural or pericardial effusion. Hepatobiliary: No focal liver abnormality is seen. No radiopaque gallstones, pericholecystic fat stranding, or biliary dilatation. Pancreas: Unremarkable. No pancreatic ductal dilatation or surrounding inflammatory changes. Spleen: Normal. Adrenals/Urinary Tract: Adrenal glands are unremarkable. Kidneys are normal, without renal calculi, focal lesion, or hydronephrosis. No hydroureter. Numerous phleboliths in the pelvis. Bladder is unremarkable. Stomach/Bowel: Normal stomach and duodenum. No dilated small bowel loops. Appendix is visualized on image 67 series 2 with newly increased density in the base and tip, possibly representing  developing appendicolith or retained hyperdense material. Normal size. No surrounding inflammation. Mild diverticulosis of the transverse colon without evidence of acute diverticulitis. Sigmoid colon and rectum are unremarkable. Vascular/Lymphatic: No significant vascular findings are present. No enlarged abdominal or pelvic lymph nodes. Reproductive: Status post hysterectomy. No adnexal masses. Other: No abdominal wall hernia or abnormality. No abdominopelvic ascites. Musculoskeletal: Transitional lumbosacral anatomy with sacralization of L5. No suspicious bone lesions. IMPRESSION: 1. No  acute abdominal/pelvic pathology. No renal/ureteral calculi or obstructive uropathy. 2. Increased density in the appendiceal base and tip, possibly developing appendicolith or retained hyperdense material. No evidence of acute appendicitis. 3. Mild colonic diverticulosis without evidence of acute diverticulitis. 4. Transitional lumbosacral anatomy with sacralization of L5. Electronically Signed   By: Emmit Alexanders M.D.   On: 05/11/2022 13:35   US Abdomen Limited RUQ (LIVER/GB)  Result Date: 05/10/2022 CLINICAL DATA:  Right upper quadrant pain EXAM: ULTRASOUND ABDOMEN LIMITED RIGHT UPPER QUADRANT COMPARISON:  CT 06/05/2021 FINDINGS: Gallbladder: Contracted gallbladder. Small polyps measuring up to 4 mm. Increased wall thickness at 3.9 mm. Negative sonographic Murphy. Common bile duct: Diameter: 2.5 mm Liver: No focal lesion identified. Within normal limits in parenchymal echogenicity. Portal vein is patent on color Doppler imaging with normal direction of blood flow towards the liver. Other: None. IMPRESSION: 1. Contracted gallbladder with small polyps. Increased wall thickness at 3.9 mm likely related to under distension. Negative sonographic Murphy. 2. Otherwise negative right upper quadrant abdominal ultrasound. Electronically Signed   By: Donavan Foil M.D.   On: 05/10/2022 23:51    Procedures Procedures  {Document cardiac monitor, telemetry assessment procedure when appropriate:1}  Medications Ordered in ED Medications  dicyclomine (BENTYL) injection 20 mg (has no administration in time range)  morphine (PF) 4 MG/ML injection 4 mg (4 mg Intravenous Given 05/11/22 1213)  ondansetron (ZOFRAN) injection 4 mg (4 mg Intravenous Given 05/11/22 1213)  iohexol (OMNIPAQUE) 300 MG/ML solution 100 mL (100 mLs Intravenous Contrast Given 05/11/22 1314)    ED Course/ Medical Decision Making/ A&P   {   Click here for ABCD2, HEART and other calculatorsREFRESH Note before signing :1}                            42 year old female with a history of SLE, Chiari malformation, depression, migraines, hyperlipidemia, who presents with concern for right-sided abdominal and flank pain.  Differential diagnosis includes cholecystitis, choledocholithiasis, pancreatitis, pneumonia, PE, small bowel obstruction, nephrolithiasis, perinephric abscess, pyelonephritis.  On initial exam, significant right upper quadrant tenderness and holding onto her right side.  Called surgery to discuss her recent ultrasound yesterday showing thickened gallbladder wall, with polyps and contracted gallbladder.  In the absence of cholelithiasis, or findings of cholecystitis is not clear this is etiology of her pain.  CT abdomen pelvis was completed to evaluate for other etiologies of her severe pain, such perinephric abscess, bowel obstruction, or other abnormalities.    Labs are completed and advised interpreted by me showed normal transaminases, normal bilirubin, no pancreatitis, no leukocytosis, no sign of urinary tract infection.  CT abdomen pelvis was completed which showed no acute intra-abdominal findings.  On reevaluation, she is continue to have severe pain to her right upper quadrant and right flank/lower chest.  She does have a history of lupus with pleuritic pain, and a CT PE study was ordered which was positive for pulmonary emboli within the right lower lobe posterior basal segmental branch without evidence of right heart strain.  Ordered heparin drip and will admit to hospitalist for further care    {Document critical care time when appropriate:1} {Document review of labs and clinical decision tools ie heart score, Chads2Vasc2 etc:1}  {Document your independent review of radiology images, and any outside records:1} {Document your discussion with family members, caretakers, and with consultants:1} {Document social determinants of health affecting pt's care:1} {Document your decision making why or why not admission,  treatments were needed:1} Final Clinical Impression(s) / ED Diagnoses Final diagnoses:  None    Rx / DC Orders ED Discharge Orders     None

## 2022-05-11 NOTE — Progress Notes (Signed)
ANTICOAGULATION CONSULT NOTE - Initial Consult  Pharmacy Consult for Heparin Indication: pulmonary embolus  Allergies  Allergen Reactions   Effexor [Venlafaxine] Nausea Only    Rash    Tramadol Nausea And Vomiting   Norco [Hydrocodone-Acetaminophen] Rash   Voltaren [Diclofenac] Rash    Patient Measurements:   Heparin Dosing Weight: 88.5 kg  Vital Signs: Temp: 97.8 F (36.6 C) (01/23 1422) Temp Source: Oral (01/23 1422) BP: 140/96 (01/23 1530) Pulse Rate: 73 (01/23 1530)  Labs: Recent Labs    05/10/22 2136 05/11/22 1140 05/11/22 1517  HGB 13.4 13.7  --   HCT 41.0 42.0  --   PLT 295 292  --   CREATININE 0.69 0.72  --   TROPONINIHS  --   --  2    Estimated Creatinine Clearance: 116.6 mL/min (by C-G formula based on SCr of 0.72 mg/dL).   Medical History: Past Medical History:  Diagnosis Date   Adnexal mass 02/24/2021   in epic seen on [place]   Amenorrhea 08/14/2021   Anxiety 08/14/2021   Arthralgia 08/18/2016   Chondromalacia, patella 06/30/2021   Chronic constipation 08/14/2021   Chronic headaches HISTORY OF MIGRAINES 08/14/2021   Chronic migraine without aura, with intractable migraine, so stated, with status migrainosus 54/62/7035   Complication of anesthesia 08/14/2021   PONV   Cyst of left ovary 06/17/2021   Degenerative disc disease, lumbar 09/04/2019   Depression with anxiety 03/26/2016   Diarrhea 07/08/2017   Effusion, left knee 12/16/2021   Endometriosis    Fatigue 03/26/2016   Gastroesophageal reflux disease 08/19/2020   Hair loss 11/22/2016   High risk medication use 11/22/2016   history of Chiari malformation    History of Chiari malformation 11/22/2016   history of COVID 02/2021   LOW GRADE FEVER CONGESTION AND HEADACHE ALL SYMPTOMS RESOLVED   Hot flashes 06/11/2021   Hyperlipidemia    Insomnia 08/19/2020   Low blood pressure    pt states history of low blood pressure-fainted 12/2010-instructed to move slowly   Nonepileptic episode ([place])  12/22/2016   Numbness and tingling 02/25/2016   OSTEOARTHRITIS 08/14/2021   Other epilepsy, intractable, without status epilepticus ([place]) 11/01/2016   Pain in right knee 11/13/2020   Photosensitivity 11/22/2016   Psychogenic nonepileptic seizure 07/27/2017   Raynaud's disease without gangrene 11/22/2016   Rectal bleeding 06/11/2021   Right lower quadrant abdominal pain 06/05/2021   Sacroiliac joint dysfunction of right side 12/19/2019   Sicca syndrome ([place]) 11/22/2016   SLE (systemic lupus erythematosus) ([place]) 03/26/2016   Subclinical hypothyroidism    Thyroid nodule    Transient alteration of awareness 01/01/2016   Wears glasses for reading 08/14/2021    Medications:  (Not in a hospital admission)  Scheduled:   heparin  6,000 Units Intravenous Once   Infusions:   heparin     PRN:   Assessment: 27 yof with a history of SLE, Chiari malformation, depression, migraines, hyperlipidemia. Patient is presenting with right-sided abdominal and flank pain. Heparin per pharmacy consult placed for pulmonary embolus.  CTA PE positive for PE w/out RHS  Patient is not on anticoagulation prior to arrival.  Hgb 13.7; plt 292  Goal of Therapy:  Heparin level 0.3-0.7 units/ml Monitor platelets by anticoagulation protocol: Yes   Plan:  Give IV heparin 6000 units bolus x 1 Start heparin infusion at 1600 units/hr Check anti-Xa level in 6 hours and daily while on heparin Continue to monitor H&H and platelets  Lorelei Pont, PharmD, BCPS 05/11/2022 4:35 PM ED Clinical  Pharmacist -  [phone]

## 2022-05-11 NOTE — ED Notes (Signed)
ED TO INPATIENT HANDOFF REPORT  ED Nurse Name and Phone #: Angelica Chessman Name/Age/Gender Kim Pham 42 y.o. female Room/Bed: MH10/MH10  Code Status   Code Status: Prior  Home/SNF/Other Home Patient oriented to: self, place, time, and situation Is this baseline? Yes   Triage Complete: Triage complete  Chief Complaint Pulmonary embolism [facility]) [I26.99]  Triage Note Persistent right flank pain , emesis . Tylenol with no relief . Had [place] 2 days ago . Has general surgery consult  referral .    Allergies Allergies  Allergen Reactions   Effexor [Venlafaxine] Nausea Only    Rash    Tramadol Nausea And Vomiting   Norco [Hydrocodone-Acetaminophen] Rash   Voltaren [Diclofenac] Rash    Level of Care/Admitting Diagnosis ED Disposition     ED Disposition  Admit   Condition  --   Comment  Hospital Area: [facility] [[id number]]  Level of Care: Med-Surg [16]  Interfacility transfer: Yes  May place patient in observation at [facility] or [facility] if equivalent level of care is available:: Yes  Covid Evaluation: Asymptomatic - no recent exposure (last 10 days) testing not required  Diagnosis: Pulmonary embolism ([place]) [[id number]]  Admitting Physician: Neena Rhymes [[id number]]  Attending Physician: Neena Rhymes [[id number]]          B Medical/Surgery History Past Medical History:  Diagnosis Date   Adnexal mass 02/24/2021   in epic seen on [place]   Amenorrhea 08/14/2021   Anxiety 08/14/2021   Arthralgia 08/18/2016   Chondromalacia, patella 06/30/2021   Chronic constipation 08/14/2021   Chronic headaches HISTORY OF MIGRAINES 08/14/2021   Chronic migraine without aura, with intractable migraine, so stated, with status migrainosus 48/04/6551   Complication of anesthesia 08/14/2021   PONV   Cyst of left ovary 06/17/2021   Degenerative disc disease, lumbar 09/04/2019   Depression with anxiety 03/26/2016   Diarrhea 07/08/2017   Effusion, left knee  12/16/2021   Endometriosis    Fatigue 03/26/2016   Gastroesophageal reflux disease 08/19/2020   Hair loss 11/22/2016   High risk medication use 11/22/2016   history of Chiari malformation    History of Chiari malformation 11/22/2016   history of COVID 02/2021   LOW GRADE FEVER CONGESTION AND HEADACHE ALL SYMPTOMS RESOLVED   Hot flashes 06/11/2021   Hyperlipidemia    Insomnia 08/19/2020   Low blood pressure    pt states history of low blood pressure-fainted 12/2010-instructed to move slowly   Nonepileptic episode ([place]) 12/22/2016   Numbness and tingling 02/25/2016   OSTEOARTHRITIS 08/14/2021   Other epilepsy, intractable, without status epilepticus ([place]) 11/01/2016   Pain in right knee 11/13/2020   Photosensitivity 11/22/2016   Psychogenic nonepileptic seizure 07/27/2017   Raynaud's disease without gangrene 11/22/2016   Rectal bleeding 06/11/2021   Right lower quadrant abdominal pain 06/05/2021   Sacroiliac joint dysfunction of right side 12/19/2019   Sicca syndrome ([place]) 11/22/2016   SLE (systemic lupus erythematosus) ([place]) 03/26/2016   Subclinical hypothyroidism    Thyroid nodule    Transient alteration of awareness 01/01/2016   Wears glasses for reading 08/14/2021   Past Surgical History:  Procedure Laterality Date   ABDOMINAL HYSTERECTOMY  2007   CYSTOSCOPY  2007   WITH HYSTERECTOMY   DIAGNOSTIC LAPAROSCOPY  2005   OOPHORECTOMY Left 08/18/2021   ROTATOR CUFF REPAIR Right 2010   SALPINGOOPHORECTOMY  04/16/2011   Procedure: SALPINGO OOPHERECTOMY;  Surgeon: Lovenia Kim, MD;  Location: [facility];  Service: Gynecology;  Laterality: Right;   skull decompression surgery 2005     @ [facility]     A IV Location/Drains/Wounds Patient Lines/Drains/Airways Status     Active Line/Drains/Airways     Name Placement date Placement time Site Days   Peripheral IV 05/11/22 20 G Right Antecubital 05/11/22  1139  Antecubital  less than 1   Peripheral IV 05/11/22 20 G  Anterior;Right Forearm 05/11/22  1709  Forearm  less than 1   Incision - 3 Ports Abdomen Mid;Upper Right Left 08/18/21  0800  -- 266            Intake/Output Last 24 hours No intake or output data in the 24 hours ending 05/11/22 1809  Labs/Imaging Results for orders placed or performed during the hospital encounter of 05/11/22 (from the past 48 hour(s))  CBC with Differential     Status: None   Collection Time: 05/11/22 11:40 AM  Result Value Ref Range   WBC 4.1 4.0 - 10.5 K/uL   RBC 4.76 3.87 - 5.11 MIL/uL   Hemoglobin 13.7 12.0 - 15.0 g/dL   HCT 42.0 36.0 - 46.0 %   MCV 88.2 80.0 - 100.0 fL   MCH 28.8 26.0 - 34.0 pg   MCHC 32.6 30.0 - 36.0 g/dL   RDW 13.2 11.5 - 15.5 %   Platelets 292 150 - 400 K/uL   nRBC 0.0 0.0 - 0.2 %   Neutrophils Relative % 51 %   Neutro Abs 2.1 1.7 - 7.7 K/uL   Lymphocytes Relative 38 %   Lymphs Abs 1.6 0.7 - 4.0 K/uL   Monocytes Relative 6 %   Monocytes Absolute 0.2 0.1 - 1.0 K/uL   Eosinophils Relative 3 %   Eosinophils Absolute 0.1 0.0 - 0.5 K/uL   Basophils Relative 2 %   Basophils Absolute 0.1 0.0 - 0.1 K/uL   Immature Granulocytes 0 %   Abs Immature Granulocytes 0.01 0.00 - 0.07 K/uL    Comment: Performed at [facility], [address] [place], [place] [address]  Comprehensive metabolic panel     Status: Abnormal   Collection Time: 05/11/22 11:40 AM  Result Value Ref Range   Sodium 140 135 - 145 mmol/L   Potassium 4.3 3.5 - 5.1 mmol/L   Chloride 104 98 - 111 mmol/L   CO2 25 22 - 32 mmol/L   Glucose, Bld 102 (H) 70 - 99 mg/dL    Comment: Glucose reference range applies only to samples taken after fasting for at least 8 hours.   BUN 11 6 - 20 mg/dL   Creatinine, Ser 0.72 0.44 - 1.00 mg/dL   Calcium 9.3 8.9 - 10.3 mg/dL   Total Protein 7.7 6.5 - 8.1 g/dL   Albumin 4.3 3.5 - 5.0 g/dL   AST 31 15 - 41 U/L   ALT 36 0 - 44 U/L   Alkaline Phosphatase 71 38 - 126 U/L   Total Bilirubin 0.4 0.3 - 1.2 mg/dL   GFR, Estimated  >60 >60 mL/min    Comment: (NOTE) Calculated using the CKD-EPI Creatinine Equation (2021)    Anion gap 11 5 - 15    Comment: Performed at [facility], [address] [place], [place] [address]  Lipase, blood     Status: None   Collection Time: 05/11/22 11:40 AM  Result Value Ref Range   Lipase 40 11 - 51 U/L    Comment: Performed at [facility], [address] [place], [place] [address]  Brain natriuretic peptide     Status: None   Collection Time: 05/11/22 11:40 AM  Result Value Ref Range   B Natriuretic Peptide 15.6 0.0 - 100.0 pg/mL    Comment: Performed at [facility], [address] [place], [place] [address]  Urinalysis, Routine w reflex microscopic Urine, Clean Catch     Status: Abnormal   Collection Time: 05/11/22  2:19 PM  Result Value Ref Range   Color, Urine YELLOW YELLOW   APPearance CLEAR CLEAR   Specific Gravity, Urine 1.010 1.005 - 1.030   pH 7.0 5.0 - 8.0   Glucose, UA NEGATIVE NEGATIVE mg/dL   Hgb urine dipstick NEGATIVE NEGATIVE   Bilirubin Urine NEGATIVE NEGATIVE   Ketones, ur NEGATIVE NEGATIVE mg/dL   Protein, ur NEGATIVE NEGATIVE mg/dL   Nitrite NEGATIVE NEGATIVE   Leukocytes,Ua TRACE (A) NEGATIVE    Comment: Performed at [facility], [address] [place], [place] [address]  Urinalysis, Microscopic (reflex)     Status: Abnormal   Collection Time: 05/11/22  2:19 PM  Result Value Ref Range   RBC / HPF 0-5 0 - 5 RBC/hpf   WBC, UA 0-5 0 - 5 WBC/hpf   Bacteria, UA RARE (A) NONE SEEN   Squamous Epithelial / HPF 0-5 0 - 5 /HPF    Comment: Performed at [facility], [address] [place], [place] [address]  Troponin I (High Sensitivity)     Status: None   Collection Time: 05/11/22  3:17 PM  Result Value Ref Range   Troponin I (High Sensitivity) 2 <18 ng/L    Comment: (NOTE) Elevated high sensitivity troponin I (hsTnI) values and significant  changes across serial measurements may suggest ACS  but many other  chronic and acute conditions are known to elevate hsTnI results.  Refer to the "Links" section for chest pain algorithms and additional  guidance. Performed at [facility], [address] [place], [place] [address]    CT Angio Chest PE W and/or Wo Contrast  Result Date: 05/11/2022 CLINICAL DATA:  High probability of pulmonary embolus. Persistent RIGHT flank pain and emesis. EXAM: CT ANGIOGRAPHY CHEST WITH CONTRAST TECHNIQUE: Multidetector CT imaging of the chest was performed using the standard protocol during bolus administration of intravenous contrast. Multiplanar CT image reconstructions and MIPs were obtained to evaluate the vascular anatomy. RADIATION DOSE REDUCTION: This exam was performed according to the departmental dose-optimization program which includes automated exposure control, adjustment of the mA and/or kV according to patient size and/or use of iterative reconstruction technique. CONTRAST:  37m OMNIPAQUE IOHEXOL 350 MG/ML SOLN COMPARISON:  04/23/2021 FINDINGS: Cardiovascular: Pulmonary arteries are well opacified by contrast bolus. There are small pulmonary emboli within the RIGHT LOWER lobe posterior basal segmental branch of the pulmonary artery. (Image 149 and 170 of series 6). The heart size is normal no pericardial effusion. Thoracic aorta is unremarkable. Mediastinum/Nodes: The visualized portion of the thyroid gland has a normal appearance. No significant mediastinal, hilar, or axillary adenopathy. Esophagus is unremarkable. Lungs/Pleura: Lungs are clear. No pleural effusion or pneumothorax. Upper Abdomen: No acute abnormality. Musculoskeletal: No chest wall abnormality. No acute or significant osseous findings. Review of the MIP images confirms the above findings. IMPRESSION: 1. Small pulmonary emboli within the RIGHT LOWER lobe posterior basal segmental branch of the pulmonary artery. 2. No evidence for RIGHT heart strain. Critical Value/emergent  results were called by telephone at  the time of interpretation on 05/11/2022 at 3:14 pm to provider [facility] , who verbally acknowledged these results. Electronically Signed   By: Nolon Nations M.D.   On: 05/11/2022 15:14   CT ABDOMEN PELVIS W CONTRAST  Result Date: 05/11/2022 CLINICAL DATA:  Abdominal/flank pain, stone suspected. Right upper quadrant pain and vomiting since yesterday. EXAM: CT ABDOMEN AND PELVIS WITH CONTRAST TECHNIQUE: Multidetector CT imaging of the abdomen and pelvis was performed using the standard protocol following bolus administration of intravenous contrast. RADIATION DOSE REDUCTION: This exam was performed according to the departmental dose-optimization program which includes automated exposure control, adjustment of the mA and/or kV according to patient size and/or use of iterative reconstruction technique. CONTRAST:  183m OMNIPAQUE IOHEXOL 300 MG/ML  SOLN COMPARISON:  CT abdomen/pelvis 06/05/2021. Right upper quadrant ultrasound 05/10/2022. FINDINGS: Lower chest: Clear lung bases.  No pleural or pericardial effusion. Hepatobiliary: No focal liver abnormality is seen. No radiopaque gallstones, pericholecystic fat stranding, or biliary dilatation. Pancreas: Unremarkable. No pancreatic ductal dilatation or surrounding inflammatory changes. Spleen: Normal. Adrenals/Urinary Tract: Adrenal glands are unremarkable. Kidneys are normal, without renal calculi, focal lesion, or hydronephrosis. No hydroureter. Numerous phleboliths in the pelvis. Bladder is unremarkable. Stomach/Bowel: Normal stomach and duodenum. No dilated small bowel loops. Appendix is visualized on image 644series 2 with newly increased density in the base and tip, possibly representing developing appendicolith or retained hyperdense material. Normal size. No surrounding inflammation. Mild diverticulosis of the transverse colon without evidence of acute diverticulitis. Sigmoid colon and rectum are unremarkable.  Vascular/Lymphatic: No significant vascular findings are present. No enlarged abdominal or pelvic lymph nodes. Reproductive: Status post hysterectomy. No adnexal masses. Other: No abdominal wall hernia or abnormality. No abdominopelvic ascites. Musculoskeletal: Transitional lumbosacral anatomy with sacralization of L5. No suspicious bone lesions. IMPRESSION: 1. No acute abdominal/pelvic pathology. No renal/ureteral calculi or obstructive uropathy. 2. Increased density in the appendiceal base and tip, possibly developing appendicolith or retained hyperdense material. No evidence of acute appendicitis. 3. Mild colonic diverticulosis without evidence of acute diverticulitis. 4. Transitional lumbosacral anatomy with sacralization of L5. Electronically Signed   By: WEmmit AlexandersM.D.   On: 05/11/2022 13:35   UKoreaAbdomen Limited RUQ (LIVER/GB)  Result Date: 05/10/2022 CLINICAL DATA:  Right upper quadrant pain EXAM: ULTRASOUND ABDOMEN LIMITED RIGHT UPPER QUADRANT COMPARISON:  CT 06/05/2021 FINDINGS: Gallbladder: Contracted gallbladder. Small polyps measuring up to 4 mm. Increased wall thickness at 3.9 mm. Negative sonographic Murphy. Common bile duct: Diameter: 2.5 mm Liver: No focal lesion identified. Within normal limits in parenchymal echogenicity. Portal vein is patent on color Doppler imaging with normal direction of blood flow towards the liver. Other: None. IMPRESSION: 1. Contracted gallbladder with small polyps. Increased wall thickness at 3.9 mm likely related to under distension. Negative sonographic Murphy. 2. Otherwise negative right upper quadrant abdominal ultrasound. Electronically Signed   By: KDonavan FoilM.D.   On: 05/10/2022 23:51    Pending Labs Unresulted Labs (From admission, onward)     Start     Ordered   05/12/22 0500  Heparin level (unfractionated)  Daily,   R      05/11/22 1635   05/11/22 2300  Heparin level (unfractionated)  Once-Timed,   URGENT        05/11/22 1635             Vitals/Pain Today's Vitals   05/11/22 1615 05/11/22 1630 05/11/22 1645 05/11/22 1727  BP:  115/76    Pulse: 67 64 64   Resp:  $'19 16 19   'Z$ Temp:      TempSrc:      SpO2: 95% 94% 98%   PainSc:    3     Isolation Precautions No active isolations  Medications Medications  heparin ADULT infusion 100 units/mL (25000 units/2110m) (1,600 Units/hr Intravenous New Bag/Given 05/11/22 1640)  morphine (PF) 4 MG/ML injection 4 mg (4 mg Intravenous Given 05/11/22 1213)  ondansetron (ZOFRAN) injection 4 mg (4 mg Intravenous Given 05/11/22 1213)  iohexol (OMNIPAQUE) 300 MG/ML solution 100 mL (100 mLs Intravenous Contrast Given 05/11/22 1314)  dicyclomine (BENTYL) injection 20 mg (20 mg Intramuscular Given 05/11/22 1414)  iohexol (OMNIPAQUE) 350 MG/ML injection 100 mL (80 mLs Intravenous Contrast Given 05/11/22 1445)  morphine (PF) 4 MG/ML injection 4 mg (4 mg Intravenous Given 05/11/22 1533)  heparin bolus via infusion 6,000 Units (6,000 Units Intravenous Bolus from Bag 05/11/22 1642)    Mobility walks     Focused Assessments Pulmonary Assessment Handoff:  Lung sounds:   O2 Device: Room Air      R Recommendations: See Admitting Provider Note  Report given to:   Additional Notes: Pt has had some R Flank pain for a few days. Pt is alert and Oriented x 4. Pt is on room air. Pt has heparin infusing at 16 ml/hr.

## 2022-05-11 NOTE — Assessment & Plan Note (Signed)
Chronic not on any therapy.  Would need to have follow-up with rheumatology

## 2022-05-11 NOTE — H&P (Signed)
Kim Pham KKX:[id number] DOB: January 20, 1981 DOA: 05/11/2022     PCP: Debbrah Alar, NP   Outpatient Specialists: NONE CARDS: Dr. Geraldo Pitter   Pulmonary    Dr.  Erin Fulling    Patient arrived to ER on 05/11/22 at 0957 Referred by Attending Norins, Heinz Knuckles, MD   Patient coming from:    home Lives  With family    Chief Complaint:   Chief Complaint  Patient presents with   Flank Pain    right    HPI: Kim Pham is a 42 y.o. female with medical history significant of SLE, GERD, Migraines,  pseudoseizures, hypothyroidism, Chiari malformation, depression,     Presented with  Right flank pain N/V Presents with Right flank pain and N/V [place] 2 days ago showed Gall bladder wall thickening Pain worse with eating Her pain has gotten severe last night with green stool N/V Temp 100.6 She took some tylenol but with no improvemnt  Reports CP with pleuritic component Few days ago had severe right leg cramp  Does not use birthcontrol, no recent trips  She is active      Regarding pertinent Chronic problems:     Hyperlipidemia - on statins  Crestor Lipid Panel     Component Value Date/Time   CHOL 273 (H) 09/08/2021 1045   TRIG 87.0 09/08/2021 1045   HDL 79.90 09/08/2021 1045   CHOLHDL 3 09/08/2021 1045   VLDL 17.4 09/08/2021 1045   [place] 176 (H) 09/08/2021 1045      Hypothyroidism:  Lab Results  Component Value Date   TSH 4.051 01/14/2022   on synthroid and cytomel    obesity-   BMI Readings from Last 1 Encounters:  05/10/22 33.23 kg/m       Asthma -well   controlled on home inhalers/      SLE - observed  While in ER:   Called surgery to discuss her recent ultrasound yesterday showing thickened gallbladder wall, with polyps and contracted gallbladder.  In the absence of cholelithiasis, or findings of cholecystitis is not clear this is etiology of her pain.  CT abdomen pelvis showed no acute intra-abdominal findings.  She continued to have severe  pain given SLe and hx of Pleuretic pain  CTA was done showed pulmonary emboli   the right lower lobe posterior basal segmental branch without evidence of right heart strain.  Heparin was started     CTabd/pelvis -  nonacute Increased density in the appendiceal base and tip, possibly developing appendicolith or retained hyperdense material. No evidence of acute appendicitis.  CTA chest -  Small pulmonary emboli within the RIGHT LOWER lobe posterior basal segmental branch of the pulmonary artery.  Following Medications were ordered in ER: Medications  heparin ADULT infusion 100 units/mL (25000 units/273m) (1,600 Units/hr Intravenous New Bag/Given 05/11/22 1640)  morphine (PF) 4 MG/ML injection 4 mg (4 mg Intravenous Given 05/11/22 1213)  ondansetron (ZOFRAN) injection 4 mg (4 mg Intravenous Given 05/11/22 1213)  iohexol (OMNIPAQUE) 300 MG/ML solution 100 mL (100 mLs Intravenous Contrast Given 05/11/22 1314)  dicyclomine (BENTYL) injection 20 mg (20 mg Intramuscular Given 05/11/22 1414)  iohexol (OMNIPAQUE) 350 MG/ML injection 100 mL (80 mLs Intravenous Contrast Given 05/11/22 1445)  morphine (PF) 4 MG/ML injection 4 mg (4 mg Intravenous Given 05/11/22 1533)  heparin bolus via infusion 6,000 Units (6,000 Units Intravenous Bolus from Bag 05/11/22 1642)  fentaNYL (SUBLIMAZE) injection 50 mcg (50 mcg Intravenous Given 05/11/22 1853)       ED  Triage Vitals  Enc Vitals Group     BP 05/11/22 1023 136/82     Pulse Rate 05/11/22 1023 70     Resp 05/11/22 1023 17     Temp 05/11/22 1023 98 F (36.7 C)     Temp Source 05/11/22 1023 Oral     SpO2 05/11/22 1023 96 %     Weight --      Height --      Head Circumference --      Peak Flow --      Pain Score 05/11/22 1028 8     Pain Loc --      Pain Edu? --      Excl. in [place]? --   TMAX(24)@     _________________________________________ Significant initial  Findings: Abnormal Labs Reviewed  URINALYSIS, ROUTINE W REFLEX MICROSCOPIC - Abnormal;  Notable for the following components:      Result Value   Leukocytes,Ua TRACE (*)    All other components within normal limits  COMPREHENSIVE METABOLIC PANEL - Abnormal; Notable for the following components:   Glucose, Bld 102 (*)    All other components within normal limits  URINALYSIS, MICROSCOPIC (REFLEX) - Abnormal; Notable for the following components:   Bacteria, UA RARE (*)    All other components within normal limits    _________________________ Troponin  2 - 3 ECG: Ordered Personally reviewed and interpreted by me showing: HR : 66 Rhythm:Sinus rhythm Low voltage, precordial leads Borderline T abnormalities, anterior leads QTC 443  The recent clinical data is shown below. Vitals:   05/11/22 1630 05/11/22 1645 05/11/22 1822 05/11/22 1946  BP: 115/76   111/74  Pulse: 64 64  75  Resp: '16 19  16  '$ Temp:   97.8 F (36.6 C) 98.1 F (36.7 C)  TempSrc:   Oral Oral  SpO2: 94% 98%  93%  WBC     Component Value Date/Time   WBC 4.1 05/11/2022 1140   LYMPHSABS 1.6 05/11/2022 1140   MONOABS 0.2 05/11/2022 1140   EOSABS 0.1 05/11/2022 1140   BASOSABS 0.1 05/11/2022 1140      UA   no evidence of UTI      Urine analysis:    Component Value Date/Time   COLORURINE YELLOW 05/11/2022 [place] 05/11/2022 1419   LABSPEC 1.010 05/11/2022 1419   PHURINE 7.0 05/11/2022 1419   GLUCOSEU NEGATIVE 05/11/2022 1419   HGBUR NEGATIVE 05/11/2022 1419   BILIRUBINUR NEGATIVE 05/11/2022 1419   KETONESUR NEGATIVE 05/11/2022 1419   PROTEINUR NEGATIVE 05/11/2022 1419   UROBILINOGEN 0.2 01/11/2015 2226   NITRITE NEGATIVE 05/11/2022 1419   LEUKOCYTESUR TRACE (A) 05/11/2022 1419    Results for orders placed or performed in visit on 10/07/21  Calprotectin, Fecal     Status: None   Collection Time: 10/07/21 11:25 AM   Specimen: Rectum; Stool   Stool  Result Value Ref Range Status   Calprotectin, Fecal 36 0 - 120 ug/g Final    Comment: Concentration     Interpretation    Follow-Up < 5 - 50 ug/g     Normal           None >50 -120 ug/g     Borderline       Re-evaluate in 4-6 weeks     >120 ug/g     Abnormal         Repeat as clinically  indicated     _______________________________________________ Hospitalist was called for admission for PE   The following Work up has been ordered so far:  Orders Placed This Encounter  Procedures   CT ABDOMEN PELVIS W CONTRAST   CT Angio Chest PE W and/or Wo Contrast   Urinalysis, Routine w reflex microscopic   CBC with Differential   Comprehensive metabolic panel   Lipase, blood   Urinalysis, Microscopic (reflex)   Brain natriuretic peptide   Heparin level (unfractionated)   Heparin level (unfractionated)   Cardiac Monitoring Continuous x 48 hours Indications for use: Other; Other indications for use: pulmonary embolus   Consult to hospitalist   heparin per pharmacy consult   heparin per pharmacy consult   ED EKG   EKG 12-Lead   Place in observation (patient's expected length of stay will be less than 2 midnights)     OTHER Significant initial  Findings:  labs showing:    Recent Labs  Lab 05/10/22 2136 05/11/22 1140  NA 140 140  K 3.5 4.3  CO2 23 25  GLUCOSE 123* 102*  BUN 11 11  CREATININE 0.69 0.72  CALCIUM 9.1 9.3    Cr   stable,    Lab Results  Component Value Date   CREATININE 0.72 05/11/2022   CREATININE 0.69 05/10/2022   CREATININE 0.77 01/14/2022    Recent Labs  Lab 05/10/22 2136 05/11/22 1140  AST 32 31  ALT 37 36  ALKPHOS 70 71  BILITOT 0.4 0.4  PROT 7.5 7.7  ALBUMIN 4.3 4.3   Lab Results  Component Value Date   CALCIUM 9.3 05/11/2022       Plt: Lab Results  Component Value Date   PLT 292 05/11/2022      Recent Labs  Lab 05/10/22 2136 05/11/22 1140  WBC 5.6 4.1  NEUTROABS  --  2.1  HGB 13.4 13.7  HCT 41.0 42.0  MCV 87.8 88.2  PLT 295 292    HG/HCT  stable,       Component Value Date/Time   HGB 13.7 05/11/2022  1140   HGB 13.0 01/01/2016 0841   HCT 42.0 05/11/2022 1140   HCT 41.7 01/01/2016 0841   MCV 88.2 05/11/2022 1140   MCV 89 01/01/2016 0841      Recent Labs  Lab 05/10/22 2136 05/11/22 1140  LIPASE 42 40   No results for input(s): "AMMONIA" in the last 168 hours.    Cardiac Panel (last 3 results) No results for input(s): "CKTOTAL", "CKMB", "TROPONINI", "RELINDX" in the last 72 hours.  .car BNP (last 3 results) Recent Labs    05/11/22 1140  BNP 15.6           Cultures: No results found for: "SDES", "SPECREQUEST", "CULT", "REPTSTATUS"   Radiological Exams on Admission: CT Angio Chest PE W and/or Wo Contrast  Result Date: 05/11/2022 CLINICAL DATA:  High probability of pulmonary embolus. Persistent RIGHT flank pain and emesis. EXAM: CT ANGIOGRAPHY CHEST WITH CONTRAST TECHNIQUE: Multidetector CT imaging of the chest was performed using the standard protocol during bolus administration of intravenous contrast. Multiplanar CT image reconstructions and MIPs were obtained to evaluate the vascular anatomy. RADIATION DOSE REDUCTION: This exam was performed according to the departmental dose-optimization program which includes automated exposure control, adjustment of the mA and/or kV according to patient size and/or use of iterative reconstruction technique. CONTRAST:  50m OMNIPAQUE IOHEXOL 350 MG/ML SOLN COMPARISON:  04/23/2021 FINDINGS: Cardiovascular: Pulmonary arteries are well opacified by contrast bolus. There are small  pulmonary emboli within the RIGHT LOWER lobe posterior basal segmental branch of the pulmonary artery. (Image 149 and 170 of series 6). The heart size is normal no pericardial effusion. Thoracic aorta is unremarkable. Mediastinum/Nodes: The visualized portion of the thyroid gland has a normal appearance. No significant mediastinal, hilar, or axillary adenopathy. Esophagus is unremarkable. Lungs/Pleura: Lungs are clear. No pleural effusion or pneumothorax. Upper Abdomen:  No acute abnormality. Musculoskeletal: No chest wall abnormality. No acute or significant osseous findings. Review of the MIP images confirms the above findings. IMPRESSION: 1. Small pulmonary emboli within the RIGHT LOWER lobe posterior basal segmental branch of the pulmonary artery. 2. No evidence for RIGHT heart strain. Critical Value/emergent results were called by telephone at the time of interpretation on 05/11/2022 at 3:14 pm to provider [facility] , who verbally acknowledged these results. Electronically Signed   By: Nolon Nations M.D.   On: 05/11/2022 15:14   CT ABDOMEN PELVIS W CONTRAST  Result Date: 05/11/2022 CLINICAL DATA:  Abdominal/flank pain, stone suspected. Right upper quadrant pain and vomiting since yesterday. EXAM: CT ABDOMEN AND PELVIS WITH CONTRAST TECHNIQUE: Multidetector CT imaging of the abdomen and pelvis was performed using the standard protocol following bolus administration of intravenous contrast. RADIATION DOSE REDUCTION: This exam was performed according to the departmental dose-optimization program which includes automated exposure control, adjustment of the mA and/or kV according to patient size and/or use of iterative reconstruction technique. CONTRAST:  137m OMNIPAQUE IOHEXOL 300 MG/ML  SOLN COMPARISON:  CT abdomen/pelvis 06/05/2021. Right upper quadrant ultrasound 05/10/2022. FINDINGS: Lower chest: Clear lung bases.  No pleural or pericardial effusion. Hepatobiliary: No focal liver abnormality is seen. No radiopaque gallstones, pericholecystic fat stranding, or biliary dilatation. Pancreas: Unremarkable. No pancreatic ductal dilatation or surrounding inflammatory changes. Spleen: Normal. Adrenals/Urinary Tract: Adrenal glands are unremarkable. Kidneys are normal, without renal calculi, focal lesion, or hydronephrosis. No hydroureter. Numerous phleboliths in the pelvis. Bladder is unremarkable. Stomach/Bowel: Normal stomach and duodenum. No dilated small bowel loops.  Appendix is visualized on image 638series 2 with newly increased density in the base and tip, possibly representing developing appendicolith or retained hyperdense material. Normal size. No surrounding inflammation. Mild diverticulosis of the transverse colon without evidence of acute diverticulitis. Sigmoid colon and rectum are unremarkable. Vascular/Lymphatic: No significant vascular findings are present. No enlarged abdominal or pelvic lymph nodes. Reproductive: Status post hysterectomy. No adnexal masses. Other: No abdominal wall hernia or abnormality. No abdominopelvic ascites. Musculoskeletal: Transitional lumbosacral anatomy with sacralization of L5. No suspicious bone lesions. IMPRESSION: 1. No acute abdominal/pelvic pathology. No renal/ureteral calculi or obstructive uropathy. 2. Increased density in the appendiceal base and tip, possibly developing appendicolith or retained hyperdense material. No evidence of acute appendicitis. 3. Mild colonic diverticulosis without evidence of acute diverticulitis. 4. Transitional lumbosacral anatomy with sacralization of L5. Electronically Signed   By: WEmmit AlexandersM.D.   On: 05/11/2022 13:35   UKoreaAbdomen Limited RUQ (LIVER/GB)  Result Date: 05/10/2022 CLINICAL DATA:  Right upper quadrant pain EXAM: ULTRASOUND ABDOMEN LIMITED RIGHT UPPER QUADRANT COMPARISON:  CT 06/05/2021 FINDINGS: Gallbladder: Contracted gallbladder. Small polyps measuring up to 4 mm. Increased wall thickness at 3.9 mm. Negative sonographic Murphy. Common bile duct: Diameter: 2.5 mm Liver: No focal lesion identified. Within normal limits in parenchymal echogenicity. Portal vein is patent on color Doppler imaging with normal direction of blood flow towards the liver. Other: None. IMPRESSION: 1. Contracted gallbladder with small polyps. Increased wall thickness at 3.9 mm likely related to under distension. Negative sonographic  Percell Miller. 2. Otherwise negative right upper quadrant abdominal  ultrasound. Electronically Signed   By: Donavan Foil M.D.   On: 05/10/2022 23:51   _______________________________________________________________________________________________________ Latest  Blood pressure 111/74, pulse 75, temperature 98.1 F (36.7 C), temperature source Oral, resp. rate 16, SpO2 93 %.   Vitals  labs and radiology finding personally reviewed  Review of Systems:    Pertinent positives include:   fatigue,   abdominal pain, nausea, vomiting,  Constitutional:  No weight loss, night sweats, Fevers, chills,weight loss  HEENT:  No headaches, Difficulty swallowing,Tooth/dental problems,Sore throat,  No sneezing, itching, ear ache, nasal congestion, post nasal drip,  Cardio-vascular:  No chest pain, Orthopnea, PND, anasarca, dizziness, palpitations.no Bilateral lower extremity swelling  GI:  No heartburn, indigestion,diarrhea, change in bowel habits, loss of appetite, melena, blood in stool, hematemesis Resp:  no shortness of breath at rest. No dyspnea on exertion, No excess mucus, no productive cough, No non-productive cough, No coughing up of blood.No change in color of mucus.No wheezing. Skin:  no rash or lesions. No jaundice GU:  no dysuria, change in color of urine, no urgency or frequency. No straining to urinate.  No flank pain.  Musculoskeletal:  No joint pain or no joint swelling. No decreased range of motion. No back pain.  Psych:  No change in mood or affect. No depression or anxiety. No memory loss.  Neuro: no localizing neurological complaints, no tingling, no weakness, no double vision, no gait abnormality, no slurred speech, no confusion  All systems reviewed and apart from [place] all are negative _______________________________________________________________________________________________ Past Medical History:   Past Medical History:  Diagnosis Date   Adnexal mass 02/24/2021   in epic seen on [place]   Amenorrhea 08/14/2021   Anxiety 08/14/2021    Arthralgia 08/18/2016   Chondromalacia, patella 06/30/2021   Chronic constipation 08/14/2021   Chronic headaches HISTORY OF MIGRAINES 08/14/2021   Chronic migraine without aura, with intractable migraine, so stated, with status migrainosus 21/30/8657   Complication of anesthesia 08/14/2021   PONV   Cyst of left ovary 06/17/2021   Degenerative disc disease, lumbar 09/04/2019   Depression with anxiety 03/26/2016   Diarrhea 07/08/2017   Effusion, left knee 12/16/2021   Endometriosis    Fatigue 03/26/2016   Gastroesophageal reflux disease 08/19/2020   Hair loss 11/22/2016   High risk medication use 11/22/2016   history of Chiari malformation    History of Chiari malformation 11/22/2016   history of COVID 02/2021   LOW GRADE FEVER CONGESTION AND HEADACHE ALL SYMPTOMS RESOLVED   Hot flashes 06/11/2021   Hyperlipidemia    Insomnia 08/19/2020   Low blood pressure    pt states history of low blood pressure-fainted 12/2010-instructed to move slowly   Nonepileptic episode ([place]) 12/22/2016   Numbness and tingling 02/25/2016   OSTEOARTHRITIS 08/14/2021   Other epilepsy, intractable, without status epilepticus ([place]) 11/01/2016   Pain in right knee 11/13/2020   Photosensitivity 11/22/2016   Psychogenic nonepileptic seizure 07/27/2017   Raynaud's disease without gangrene 11/22/2016   Rectal bleeding 06/11/2021   Right lower quadrant abdominal pain 06/05/2021   Sacroiliac joint dysfunction of right side 12/19/2019   Sicca syndrome ([place]) 11/22/2016   SLE (systemic lupus erythematosus) ([place]) 03/26/2016   Subclinical hypothyroidism    Thyroid nodule    Transient alteration of awareness 01/01/2016   Wears glasses for reading 08/14/2021     Past Surgical History:  Procedure Laterality Date   ABDOMINAL HYSTERECTOMY  2007   CYSTOSCOPY  2007  WITH HYSTERECTOMY   DIAGNOSTIC LAPAROSCOPY  2005   OOPHORECTOMY Left 08/18/2021   ROTATOR CUFF REPAIR Right 2010   SALPINGOOPHORECTOMY  04/16/2011    Procedure: SALPINGO OOPHERECTOMY;  Surgeon: Lovenia Kim, MD;  Location: [facility];  Service: Gynecology;  Laterality: Right;   skull decompression surgery 2005     @ [facility]    Social History:  Ambulatory   independently       reports that she quit smoking about 18 years ago. Her smoking use included cigarettes. She has a 10.00 pack-year smoking history. She has been exposed to tobacco smoke. She has never used smokeless tobacco. She reports that she does not currently use alcohol. She reports that she does not use drugs.     Family History:   Family History  Problem Relation Age of Onset   Hypertension Mother    CAD Mother    Breast cancer Mother    Migraines Mother    Gout Mother    Heart disease Mother    Crohn's disease Mother    Hypertension Father    CAD Father    CVA Father    Heart disease Father    Colon polyps Father    Liver disease Father    Sjogren's syndrome Sister    Celiac disease Sister    Rheum arthritis Maternal Grandmother    Dementia Maternal Grandmother    Pancreatic cancer Maternal Grandfather    CAD Paternal Grandmother    Macular degeneration Paternal Grandmother    CVA Paternal Grandfather    Autism Son    ADD / ADHD Son    Breast cancer Maternal Aunt    Esophageal cancer Neg Hx    Colon cancer Neg Hx    Stomach cancer Neg Hx    ______________________________________________________________________________________________ Allergies: Allergies  Allergen Reactions   Effexor [Venlafaxine] Nausea Only    Rash    Tramadol Nausea And Vomiting   Norco [Hydrocodone-Acetaminophen] Rash   Voltaren [Diclofenac] Rash     Prior to Admission medications   Medication Sig Start Date End Date Taking? Authorizing Provider  benzonatate (TESSALON) 100 MG capsule Take 1 capsule (100 mg total) by mouth 3 (three) times daily as needed for cough. 02/17/22   Saguier, Percell Miller, PA-C  fluticasone (FLONASE) 50 MCG/ACT nasal spray Place 2 sprays into  both nostrils daily. 02/17/22   Saguier, Percell Miller, PA-C  fluticasone-salmeterol (ADVAIR HFA) 332-95 MCG/ACT inhaler Inhale 2 puffs into the lungs 2 (two) times daily. 03/17/22   Freddi Starr, MD  gabapentin (NEURONTIN) 300 MG capsule TAKE ONE CAPSULE BY MOUTH AT BEDTIME 11/26/21   Debbrah Alar, NP  levothyroxine (SYNTHROID) 50 MCG tablet Take 50 mcg by mouth daily before breakfast. Take 1.5 tablets each morning    [provider]  LORazepam (ATIVAN) 1 MG tablet Take 1 tablet by mouth 30 minutes prior to MRI 04/30/22   Debbrah Alar, NP  meloxicam (MOBIC) 15 MG tablet Take 1 tablet (15 mg total) by mouth daily. 12/11/21   Marrian Salvage, FNP  oxyCODONE-acetaminophen (PERCOCET) 5-325 MG tablet Take 1 tablet by mouth every 6 (six) hours as needed for severe pain. 05/11/22   Debbrah Alar, NP  pantoprazole (PROTONIX) 40 MG tablet Take 1 tablet (40 mg total) by mouth 2 (two) times daily. 05/07/22   Debbrah Alar, NP  PARoxetine (PAXIL) 20 MG tablet Take 1 tablet (20 mg total) by mouth at bedtime. 08/13/21   Truett Mainland, DO  rosuvastatin (CRESTOR) 20 MG tablet Take  1 tablet (20 mg total) by mouth daily. 12/28/21 12/23/22  Revankar, Reita Cliche, MD  SUMAtriptan (IMITREX) 100 MG tablet Take 1 tablet (100 mg total) by mouth at onset of migraine. May repeat in 2 hours if headache persists or recurs. Do not take more than 2 doses in 24 hours. 06/29/17   Melvenia Beam, MD  traZODone (DESYREL) 100 MG tablet TAKE TWO TABLETS BY MOUTH EVERY NIGHT AT BEDTIME 04/13/22   Debbrah Alar, NP    ___________________________________________________________________________________________________ Physical Exam:    05/11/2022    7:46 PM 05/11/2022    4:45 PM 05/11/2022    4:30 PM  Vitals with BMI  Systolic 595  638  Diastolic 74  76  Pulse 75 64 64    1. General:  in No  Acute distress   Chronically ill  -appearing 2. Psychological: Alert and   Oriented 3. Head/ENT:     Dry Mucous Membranes                          Head Non traumatic, neck supple                          Normal  Dentition 4. SKIN: decreased Skin turgor,  Skin clean Dry and intact no rash 5. Heart: Regular rate and rhythm no  Murmur, no Rub or gallop 6. Lungs:  Clear to auscultation bilaterally, no wheezes or crackles   7. Abdomen: Soft,  non-tender, Non distended  bowel sounds present 8. Lower extremities: no clubbing, cyanosis, no  edema 9. Neurologically Grossly intact, moving all 4 extremities equally  10. MSK: Normal range of motion    Chart has been reviewed  ______________________________________________________________________________________________  Assessment/Plan 42 y.o. female with medical history significant of SLE, GERD, Migraines,  pseudoseizures, hypothyroidism, Chiari malformation, depression,     Admitted for PE   Present on Admission:  Pulmonary embolism (HCC)  SLE (systemic lupus erythematosus) (HCC)  Obesity (BMI 30.0-34.9)  Subclinical hypothyroidism     Pulmonary embolism ([place])  Admit to   telemetry    Initiate heparin drip  Would likely benefit from case manager consult for long term anticoagulation Hold home blood pressure medications avoid hypotension Cycle cardiac enzymes Order echogram and lower extremity Dopplers  Most likely risk factors for hypercoagulable state being  obesity SLE  Would benefit from hypercoagulable workup as an outpatient     SLE (systemic lupus erythematosus) ([place]) Chronic not on any therapy.  Would need to have follow-up with rheumatology  Obesity (BMI 30.0-34.9) Follow-up as an outpatient nutrition  Subclinical hypothyroidism Check TSH continue Synthroid 37.5 micrograms p.o. daily continue Cytomel 10 mcg p.o. daily   Other plan as per orders.  DVT prophylaxis:  heparin      Code Status:    Code Status: Prior FULL CODE  as per patient  I had personally discussed CODE STATUS with patient and family     Family  Communication:   Family  at  Bedside  plan of care was discussed  with  Husband,    Disposition Plan:     To home once workup is complete and patient is stable   Following barriers for discharge:                           No cardiac events on tele  Consults called: none  Admission status:  ED Disposition     ED Disposition  Admit   Condition  --   [place]: [facility] [[id number]]  Level of Care: Med-Surg [16]  Interfacility transfer: Yes  May place patient in observation at [facility] or [place] if equivalent level of care is available:: Yes  Covid Evaluation: Asymptomatic - no recent exposure (last 10 days) testing not required  Diagnosis: Pulmonary embolism ([place]) [119417]  Admitting Physician: Neena Rhymes [[id number]]  Attending Physician: Adella Hare E [[id number]]          Obs     Level of care     tele  For 24H    Cameka Rae 05/11/2022, 9:16 PM    [facility]     after 2 AM please page floor coverage PA If 7AM-7PM, please contact the day team taking care of the patient using [email or web address]   Patient was evaluated in the context of the global COVID-19 pandemic, which necessitated consideration that the patient might be at risk for infection with the SARS-CoV-2 virus that causes COVID-19. Institutional protocols and algorithms that pertain to the evaluation of patients at risk for COVID-19 are in a state of rapid change based on information released by regulatory bodies including the CDC and federal and state organizations. These policies and algorithms were followed during the patient's care.

## 2022-05-11 NOTE — ED Triage Notes (Signed)
Persistent right flank pain , emesis . Tylenol with no relief . Had [place] 2 days ago . Has general surgery consult  referral .

## 2022-05-11 NOTE — Assessment & Plan Note (Signed)
Follow-up as an outpatient nutrition

## 2022-05-11 NOTE — Assessment & Plan Note (Signed)
Admit to   telemetry    Initiate heparin drip  Would likely benefit from case manager consult for long term anticoagulation Hold home blood pressure medications avoid hypotension Cycle cardiac enzymes Order echogram and lower extremity Dopplers  Most likely risk factors for hypercoagulable state being  obesity SLE  Would benefit from hypercoagulable workup as an outpatient

## 2022-05-11 NOTE — Discharge Instructions (Addendum)
Information on my medicine - ELIQUIS (apixaban)  Why was Eliquis prescribed for you? Eliquis was prescribed to treat blood clots that may have been found in the veins of your legs (deep vein thrombosis) or in your lungs (pulmonary embolism) and to reduce the risk of them occurring again.  What do You need to know about Eliquis ? The starting dose is 10 mg (two 5 mg tablets) taken TWICE daily for the FIRST SEVEN (7) DAYS, then on (enter date)  05/20/22  the dose is reduced to ONE 5 mg tablet taken TWICE daily.  Eliquis may be taken with or without food.   Try to take the dose about the same time in the morning and in the evening. If you have difficulty swallowing the tablet whole please discuss with your pharmacist how to take the medication safely.  Take Eliquis exactly as prescribed and DO NOT stop taking Eliquis without talking to the doctor who prescribed the medication.  Stopping may increase your risk of developing a new blood clot.  Refill your prescription before you run out.  After discharge, you should have regular check-up appointments with your healthcare provider that is prescribing your Eliquis.    What do you do if you miss a dose? If a dose of ELIQUIS is not taken at the scheduled time, take it as soon as possible on the same day and twice-daily administration should be resumed. The dose should not be doubled to make up for a missed dose.  Important Safety Information A possible side effect of Eliquis is bleeding. You should call your healthcare provider right away if you experience any of the following: Bleeding from an injury or your nose that does not stop. Unusual colored urine (red or dark brown) or unusual colored stools (red or black). Unusual bruising for unknown reasons. A serious fall or if you hit your head (even if there is no bleeding).  Some medicines may interact with Eliquis and might increase your risk of bleeding or clotting while on Eliquis. To help  avoid this, consult your healthcare provider or pharmacist prior to using any new prescription or non-prescription medications, including herbals, vitamins, non-steroidal anti-inflammatory drugs (NSAIDs) and supplements.  This website has more information on Eliquis (apixaban): http://www.eliquis.com/eliquis/home

## 2022-05-12 ENCOUNTER — Observation Stay (HOSPITAL_BASED_OUTPATIENT_CLINIC_OR_DEPARTMENT_OTHER): Payer: BC Managed Care – PPO

## 2022-05-12 ENCOUNTER — Other Ambulatory Visit (HOSPITAL_COMMUNITY): Payer: Self-pay

## 2022-05-12 DIAGNOSIS — I2693 Single subsegmental pulmonary embolism without acute cor pulmonale: Secondary | ICD-10-CM

## 2022-05-12 DIAGNOSIS — I2699 Other pulmonary embolism without acute cor pulmonale: Secondary | ICD-10-CM | POA: Diagnosis not present

## 2022-05-12 LAB — COMPREHENSIVE METABOLIC PANEL
ALT: 36 U/L (ref 0–44)
ALT: 29 U/L (ref 0–44)
AST: 31 U/L (ref 15–41)
AST: 24 U/L (ref 15–41)
Albumin: 4.3 g/dL (ref 3.5–5.0)
Albumin: 3.4 g/dL — ABNORMAL LOW (ref 3.5–5.0)
Alkaline Phosphatase: 71 U/L (ref 38–126)
Alkaline Phosphatase: 56 U/L (ref 38–126)
Anion gap: 11 (ref 5–15)
Anion gap: 11 (ref 5–15)
BUN: 11 mg/dL (ref 6–20)
BUN: 15 mg/dL (ref 6–20)
CO2: 25 mmol/L (ref 22–32)
CO2: 25 mmol/L (ref 22–32)
Calcium: 9.3 mg/dL (ref 8.9–10.3)
Calcium: 8.6 mg/dL — ABNORMAL LOW (ref 8.9–10.3)
Chloride: 104 mmol/L (ref 98–111)
Chloride: 104 mmol/L (ref 98–111)
Creatinine, Ser: 0.72 mg/dL (ref 0.44–1.00)
Creatinine, Ser: 0.97 mg/dL (ref 0.44–1.00)
GFR, Estimated: >60 mL/min (ref >60)
GFR, Estimated: >60 mL/min (ref >60)
Glucose, Bld: 102 mg/dL — ABNORMAL HIGH (ref 70–99)
Glucose, Bld: 93 mg/dL (ref 70–99)
Potassium: 4.3 mmol/L (ref 3.5–5.1)
Potassium: 3.7 mmol/L (ref 3.5–5.1)
Sodium: 140 mmol/L (ref 135–145)
Sodium: 140 mmol/L (ref 135–145)
Total Bilirubin: 0.4 mg/dL (ref 0.3–1.2)
Total Bilirubin: 0.5 mg/dL (ref 0.3–1.2)
Total Protein: 7.7 g/dL (ref 6.5–8.1)
Total Protein: 6.1 g/dL — ABNORMAL LOW (ref 6.5–8.1)

## 2022-05-12 LAB — CBC
HCT: 38.7 % (ref 36.0–46.0)
Hemoglobin: 12.2 g/dL (ref 12.0–15.0)
MCH: 29.4 pg (ref 26.0–34.0)
MCHC: 31.5 g/dL (ref 30.0–36.0)
MCV: 93.3 fL (ref 80.0–100.0)
Platelets: 245 10*3/uL (ref 150–400)
RBC: 4.15 MIL/uL (ref 3.87–5.11)
RDW: 13.4 % (ref 11.5–15.5)
WBC: 5.2 10*3/uL (ref 4.0–10.5)
nRBC: 0 % (ref 0.0–0.2)

## 2022-05-12 LAB — HEPARIN LEVEL (UNFRACTIONATED)
Heparin Unfractionated: >1.1 IU/mL — ABNORMAL HIGH (ref 0.30–0.70)
Heparin Unfractionated: 0.83 IU/mL — ABNORMAL HIGH (ref 0.30–0.70)
Heparin Unfractionated: 0.55 IU/mL (ref 0.30–0.70)

## 2022-05-12 LAB — MAGNESIUM: Magnesium: 1.8 mg/dL (ref 1.7–2.4)

## 2022-05-12 LAB — ECHOCARDIOGRAM COMPLETE
Area-P 1/2: 3.72 cm2
Calc EF: 61.7 %
S' Lateral: 3.7 cm
Single Plane A2C EF: 65.9 %
Single Plane A4C EF: 58.6 %

## 2022-05-12 LAB — TSH: TSH: 5.343 u[IU]/mL — ABNORMAL HIGH (ref 0.350–4.500)

## 2022-05-12 LAB — PHOSPHORUS: Phosphorus: 4.7 mg/dL — ABNORMAL HIGH (ref 2.5–4.6)

## 2022-05-12 MED ORDER — OXYCODONE HCL 5 MG PO TABS
5.0000 mg | ORAL_TABLET | ORAL | Status: DC | PRN
  Administered 2022-05-12 – 2022-05-13 (×3): 5 mg via ORAL
  Filled 2022-05-12 (×4): qty 1

## 2022-05-12 MED ORDER — METHOCARBAMOL 500 MG PO TABS
500.0000 mg | ORAL_TABLET | Freq: Three times a day (TID) | ORAL | Status: DC
  Administered 2022-05-12 – 2022-05-13 (×4): 500 mg via ORAL
  Filled 2022-05-12 (×4): qty 1

## 2022-05-12 MED ORDER — HEPARIN (PORCINE) 25000 UT/250ML-% IV SOLN
1150.0000 [IU]/h | INTRAVENOUS | Status: DC
  Administered 2022-05-12 (×2): 1400 [IU]/h via INTRAVENOUS
  Administered 2022-05-13: 1150 [IU]/h via INTRAVENOUS
  Filled 2022-05-12 (×2): qty 250

## 2022-05-12 MED ORDER — ONDANSETRON HCL 4 MG/2ML IJ SOLN
4.0000 mg | Freq: Four times a day (QID) | INTRAMUSCULAR | Status: DC | PRN
  Administered 2022-05-12 – 2022-05-13 (×4): 4 mg via INTRAVENOUS
  Filled 2022-05-12 (×4): qty 2

## 2022-05-12 MED ORDER — PERFLUTREN LIPID MICROSPHERE
1.0000 mL | INTRAVENOUS | Status: AC | PRN
  Administered 2022-05-12: 2 mL via INTRAVENOUS

## 2022-05-12 NOTE — Hospital Course (Signed)
PMH of lupus, GERD, migration, depression present to the hospital with complaints of right-sided flank pain ongoing for last few days associated with severe nausea as well as diarrhea.  She also reports some fever prior to coming to the hospital.  Found to have a PE, thickened gallbladder with normal LFTs.

## 2022-05-12 NOTE — TOC Benefit Eligibility Note (Signed)
Patient Kim Pham, English as a foreign language completed.    The patient is currently admitted and upon discharge could be taking Eliquis 5 mg.  The current 30 day co-pay is $481.84 due to a $4,000.00 deductible remaining.   The patient is currently admitted and upon discharge could be taking Xarelto 20 mg.  The current 30 day co-pay is $430.11 due to a $4,000.00 deductible remaining.   The patient is insured through [employer] Can use [employer] [place], [place] Patient [place] Patient Advocate Team Direct Number: [phone]  Fax: [phone]

## 2022-05-12 NOTE — Progress Notes (Signed)
BLE venous duplex has been completed.   Results can be found under chart review under CV PROC. 05/12/2022 10:04 AM Garcia Dalzell RVT, RDMS

## 2022-05-12 NOTE — TOC Progression Note (Signed)
Transition of Care [facility]) - Progression Note   Patient Details  Name: Kim Pham MRN: [id number] Date of Birth: 08-09-1980  Transition of Care [facility]) CM/SW [place], LCSW Phone Number: 05/12/2022, 3:56 PM  Clinical Narrative: [facility] consulted for medication assistance, but the benefits check has been completed by pharmacy for Eliquis. Consult to be cleared.  Social Determinants of Health (SDOH) Interventions SDOH Screenings   Food Insecurity: No Food Insecurity (05/11/2022)  Housing: Low Risk  (05/11/2022)  Transportation Needs: No Transportation Needs (05/11/2022)  Utilities: Not At Risk (05/11/2022)  Depression (PHQ2-9): Low Risk  (05/07/2022)  Tobacco Use: Medium Risk (05/11/2022)   Readmission Risk Interventions     No data to display

## 2022-05-12 NOTE — Progress Notes (Signed)
ANTICOAGULATION CONSULT NOTE - Follow Up Consult  Pharmacy Consult for Heparin Indication: pulmonary embolus  Allergies  Allergen Reactions   Effexor [Venlafaxine] Nausea Only    Rash    Rosuvastatin Nausea Only   Tape Itching and Other (See Comments)    Skin itches if tape is left on for awhile   Tramadol Nausea And Vomiting   Norco [Hydrocodone-Acetaminophen] Rash   Voltaren [Diclofenac] Rash    Patient Measurements:   Heparin Dosing Weight: 88.5 kg  Vital Signs: Temp: 98.2 F (36.8 C) (01/24 1008) Temp Source: Oral (01/24 1008) BP: 127/76 (01/24 1008) Pulse Rate: 71 (01/24 1008)  Labs: Recent Labs    05/10/22 2136 05/11/22 1140 05/11/22 1517 05/11/22 1816 05/11/22 2257 05/12/22 0436 05/12/22 0930  HGB 13.4 13.7  --   --   --  12.2  --   HCT 41.0 42.0  --   --   --  38.7  --   PLT 295 292  --   --   --  245  --   HEPARINUNFRC  --   --   --   --  >1.10*  --  0.83*  CREATININE 0.69 0.72  --   --   --  0.97  --   TROPONINIHS  --   --  2 3  --   --   --     Estimated Creatinine Clearance: 96.1 mL/min (by C-G formula based on SCr of 0.97 mg/dL).   Assessment: AC/Heme: not on ac pta >> hep gtt for new RLL small PE. Hgb 13.7>12.2; plt 292>245 - Hep level 0.83 remains above goal. Doppler report pending.  Goal of Therapy:  Heparin level 0.3-0.7 units/ml Monitor platelets by anticoagulation protocol: Yes   Plan:  Decrease IV heparin to 1150 units/hr Repeat heparin level in 8 hrs. Daily HL and CBC F/u [place]   Deneshia Zucker S. Alford Highland, PharmD, BCPS Clinical Staff Pharmacist [email or web address] Alford Highland, [employer] 05/12/2022,10:41 AM

## 2022-05-12 NOTE — Progress Notes (Signed)
ANTICOAGULATION CONSULT NOTE - Follow Up Consult  Pharmacy Consult for Heparin Indication: pulmonary embolus  Allergies  Allergen Reactions   Effexor [Venlafaxine] Nausea Only    Rash    Rosuvastatin Nausea Only   Tape Itching and Other (See Comments)    Skin itches if tape is left on for awhile   Tramadol Nausea And Vomiting   Norco [Hydrocodone-Acetaminophen] Rash   Voltaren [Diclofenac] Rash    Patient Measurements:   Heparin Dosing Weight: 88.5 kg  Vital Signs: Temp: 97.7 F (36.5 C) (01/24 1744) Temp Source: Oral (01/24 1744) BP: 118/54 (01/24 1744) Pulse Rate: 61 (01/24 1744)  Labs: Recent Labs    05/10/22 2136 05/11/22 1140 05/11/22 1517 05/11/22 1816 05/11/22 2257 05/12/22 0436 05/12/22 0930 05/12/22 2021  HGB 13.4 13.7  --   --   --  12.2  --   --   HCT 41.0 42.0  --   --   --  38.7  --   --   PLT 295 292  --   --   --  245  --   --   HEPARINUNFRC  --   --   --   --  >1.10*  --  0.83* 0.55  CREATININE 0.69 0.72  --   --   --  0.97  --   --   TROPONINIHS  --   --  2 3  --   --   --   --      Estimated Creatinine Clearance: 96.1 mL/min (by C-G formula based on SCr of 0.97 mg/dL).   Assessment: 19 yof with a history of SLE, Chiari malformation, depression, migraines, hyperlipidemia. Patient is presenting with right-sided abdominal and flank pain. Heparin per pharmacy consult placed for pulmonary embolus.   CTA PE positive for PE w/out RHS   Patient is not on anticoagulation prior to arrival.  Today, 05/12/2022: CBC: stable WNL Most recent heparin level therapeutic after rate decrease to 1150 units/hr SCr stable; at baseline No bleeding or infusion issues per RN  Goal of Therapy:  Heparin level 0.3-0.7 units/ml Monitor platelets by anticoagulation protocol: Yes   Plan:  Continue heparin at 1150 units/hr Add confirmatory heparin level with AM labs tomorrow Daily HL and CBC F/u [place], PharmD, BCPS [phone] 05/12/2022, 8:55  PM

## 2022-05-12 NOTE — Progress Notes (Signed)
  Echocardiogram 2D Echocardiogram has been performed.  Kim Pham 05/12/2022, 2:26 PM

## 2022-05-12 NOTE — Progress Notes (Signed)
ANTICOAGULATION CONSULT NOTE - follow up  Pharmacy Consult for Heparin Indication: pulmonary embolus  Allergies  Allergen Reactions   Effexor [Venlafaxine] Nausea Only    Rash    Rosuvastatin Nausea Only   Tape Itching and Other (See Comments)    Skin itches if tape is left on for awhile   Tramadol Nausea And Vomiting   Norco [Hydrocodone-Acetaminophen] Rash   Voltaren [Diclofenac] Rash    Patient Measurements:   Heparin Dosing Weight: 88.5 kg  Vital Signs: Temp: 98.1 F (36.7 C) (01/23 1946) Temp Source: Oral (01/23 1946) BP: 111/74 (01/23 1946) Pulse Rate: 75 (01/23 1946)  Labs: Recent Labs    05/10/22 2136 05/11/22 1140 05/11/22 1517 05/11/22 1816 05/11/22 2257  HGB 13.4 13.7  --   --   --   HCT 41.0 42.0  --   --   --   PLT 295 292  --   --   --   HEPARINUNFRC  --   --   --   --  >1.10*  CREATININE 0.69 0.72  --   --   --   TROPONINIHS  --   --  2 3  --      Estimated Creatinine Clearance: 116.6 mL/min (by C-G formula based on SCr of 0.72 mg/dL).   Medical History: Past Medical History:  Diagnosis Date   Adnexal mass 02/24/2021   in epic seen on [place]   Amenorrhea 08/14/2021   Anxiety 08/14/2021   Arthralgia 08/18/2016   Chondromalacia, patella 06/30/2021   Chronic constipation 08/14/2021   Chronic headaches HISTORY OF MIGRAINES 08/14/2021   Chronic migraine without aura, with intractable migraine, so stated, with status migrainosus 63/14/9702   Complication of anesthesia 08/14/2021   PONV   Cyst of left ovary 06/17/2021   Degenerative disc disease, lumbar 09/04/2019   Depression with anxiety 03/26/2016   Diarrhea 07/08/2017   Effusion, left knee 12/16/2021   Endometriosis    Fatigue 03/26/2016   Gastroesophageal reflux disease 08/19/2020   Hair loss 11/22/2016   High risk medication use 11/22/2016   history of Chiari malformation    History of Chiari malformation 11/22/2016   history of COVID 02/2021   LOW GRADE FEVER CONGESTION AND  HEADACHE ALL SYMPTOMS RESOLVED   Hot flashes 06/11/2021   Hyperlipidemia    Insomnia 08/19/2020   Low blood pressure    pt states history of low blood pressure-fainted 12/2010-instructed to move slowly   Nonepileptic episode ([place]) 12/22/2016   Numbness and tingling 02/25/2016   OSTEOARTHRITIS 08/14/2021   Other epilepsy, intractable, without status epilepticus ([place]) 11/01/2016   Pain in right knee 11/13/2020   Photosensitivity 11/22/2016   Psychogenic nonepileptic seizure 07/27/2017   Raynaud's disease without gangrene 11/22/2016   Rectal bleeding 06/11/2021   Right lower quadrant abdominal pain 06/05/2021   Sacroiliac joint dysfunction of right side 12/19/2019   Sicca syndrome ([place]) 11/22/2016   SLE (systemic lupus erythematosus) ([place]) 03/26/2016   Subclinical hypothyroidism    Thyroid nodule    Transient alteration of awareness 01/01/2016   Wears glasses for reading 08/14/2021    Medications:  Medications Prior to Admission  Medication Sig Dispense Refill Last Dose   fluticasone-salmeterol (ADVAIR HFA) 115-21 MCG/ACT inhaler Inhale 2 puffs into the lungs 2 (two) times daily. 1 each 12 05/10/2022 at pm   gabapentin (NEURONTIN) 300 MG capsule TAKE ONE CAPSULE BY MOUTH AT BEDTIME 90 capsule 1 05/10/2022 at pm   ibuprofen (ADVIL) 200 MG tablet Take 600-800 mg by mouth  every 6 (six) hours as needed for mild pain or headache.   unk   levothyroxine (SYNTHROID) 25 MCG tablet Take 37.5 mcg by mouth daily before breakfast.   05/10/2022 at am   liothyronine (CYTOMEL) 5 MCG tablet Take 10 mcg by mouth in the morning.   05/10/2022 at am   Melatonin 10 MG TABS Take 20 mg by mouth at bedtime.   05/10/2022 at pm   PARoxetine (PAXIL) 20 MG tablet Take 1 tablet (20 mg total) by mouth at bedtime. 90 tablet 3 05/10/2022 at pm   SUMAtriptan (IMITREX) 100 MG tablet Take 1 tablet (100 mg total) by mouth at onset of migraine. May repeat in 2 hours if headache persists or recurs. Do not take more than 2 doses  in 24 hours. (Patient taking differently: Take 100 mg by mouth as needed (at onset of migraine- may repeat once in 2 hours, if no relief- max of 2 doses/24 hours).) 10 tablet 2 unk   traZODone (DESYREL) 100 MG tablet TAKE TWO TABLETS BY MOUTH EVERY NIGHT AT BEDTIME (Patient taking differently: Take 200 mg by mouth at bedtime.) 180 tablet 1 05/10/2022 at pm   benzonatate (TESSALON) 100 MG capsule Take 1 capsule (100 mg total) by mouth 3 (three) times daily as needed for cough. (Patient not taking: Reported on 05/11/2022) 30 capsule 0 Not Taking   fluticasone (FLONASE) 50 MCG/ACT nasal spray Place 2 sprays into both nostrils daily. (Patient not taking: Reported on 05/11/2022) 16 g 1 Not Taking   LORazepam (ATIVAN) 1 MG tablet Take 1 tablet by mouth 30 minutes prior to MRI (Patient not taking: Reported on 05/11/2022) 1 tablet 0 Not Taking   meloxicam (MOBIC) 15 MG tablet Take 1 tablet (15 mg total) by mouth daily. (Patient not taking: Reported on 05/11/2022) 30 tablet 0 Not Taking   oxyCODONE-acetaminophen (PERCOCET) 5-325 MG tablet Take 1 tablet by mouth every 6 (six) hours as needed for severe pain. (Patient not taking: Reported on 05/11/2022) 20 tablet 0 Not Taking   pantoprazole (PROTONIX) 40 MG tablet Take 1 tablet (40 mg total) by mouth 2 (two) times daily. (Patient not taking: Reported on 05/11/2022) 180 tablet 0 Not Taking   rosuvastatin (CRESTOR) 20 MG tablet Take 1 tablet (20 mg total) by mouth daily. (Patient not taking: Reported on 05/11/2022) 90 tablet 3 Not Taking    Scheduled:   gabapentin  300 mg Oral QHS   levothyroxine  37.5 mcg Oral Q0600   liothyronine  10 mcg Oral Q0600   PARoxetine  20 mg Oral QHS   traZODone  200 mg Oral QHS   Infusions:   sodium chloride 75 mL/hr at 05/11/22 2122   heparin 1,600 Units/hr (05/11/22 1640)   PRN:   Assessment: 43 yof with a history of SLE, Chiari malformation, depression, migraines, hyperlipidemia. Patient is presenting with right-sided abdominal  and flank pain. Heparin per pharmacy consult placed for pulmonary embolus.  CTA PE positive for PE w/out RHS  Patient is not on anticoagulation prior to arrival.  Hgb 13.7; plt 292  1st HL > 1.1 on 1600 units/hr Per RN no bleeding  Goal of Therapy:  Heparin level 0.3-0.7 units/ml Monitor platelets by anticoagulation protocol: Yes   Plan:  Hold heparin drip x 1 hour Decrease heparin drip to 1400 units/hr Check anti-Xa level in 6 hours and daily while on heparin Continue to monitor H&H and platelets  Dolly Rias RPh 05/12/2022, 1:35 AM

## 2022-05-12 NOTE — Progress Notes (Signed)
[facility] Progress Note Patient: Kim Pham OQH:[id number] DOB: [birth year] DOA: 05/11/2022  DOS: the patient was seen and examined on 05/12/2022  Brief hospital course: PMH of lupus, GERD, migration, depression present to the hospital with complaints of right-sided flank pain ongoing for last few days associated with severe nausea as well as diarrhea.  She also reports some fever prior to coming to the hospital.  Found to have a PE, thickened gallbladder with normal LFTs. Assessment and Plan: Acute pulmonary embolism. Right-sided small PE.  No infarct seen on the CT scan. Patient continues to have active tenderness. Maintaining good saturation on ambulation. Lower extremity Doppler negative for DVT. History of blood clots in both father and mother in their 45s. She does have history of SLE. This puts the patient at high risk for DVT PE but otherwise no significant risk factors, recent procedures, recent immobilization, recent medication. Currently on IV heparin. Will continue the same due to her ongoing complaints of nausea and vomiting. Will transition to DOAC's.  Co-pay $400.  Will discuss with patient before making transition.  Intractable nausea and vomiting. Will continue supportive medications. Etiology not clear since the CT does not show any evidence of acute abnormality. Possibility of a viral gastroenteritis cannot be ruled out.  Diarrhea. Similarly above possibility of a viral gastroenteritis cannot be ruled out. Monitor.  Thickened gallbladder. Patient does have evidence of thickened gallbladder on the ultrasound. LFTs are normal.  No leukocytosis. No CBD dilation or any other acute findings. At present further workup is not indicated. Will monitor.  Hypothyroidism. Patient is on Synthroid and Cytomel. Unclear why she is on both medication when the Synthroid is not maxed out. For now we will continue the same.  Obesity. BMI 33.23. Placing the patient  at high risk for poor outcome.  Certainly a risk factor for PE. Monitor.  Phleboliths. Appendicolith. No evidence of acute abnormality related to this finding.  No surrounding stranding in the soft tissue.  Will monitor.  Subjective: Reports nausea.  No vomiting.  Had 2 bowel movement so far.  No blood in the stool.  No fever no chills.  No rash anywhere.  Tolerating heparin without any bleeding.  Physical Exam: General: in Mild distress, No Rash Cardiovascular: S1 and S2 Present, No Murmur Respiratory: Good respiratory effort, Bilateral Air entry present. No Crackles, No wheezes Abdomen: Bowel Sound present, right-sided tenderness Extremities: No edema Neuro: Alert and oriented x3, no new focal deficit  Data Reviewed: I have Reviewed nursing notes, Vitals, and Lab results. Since last encounter, pertinent lab results CBC and BMP   . I have ordered test including CBC and BMP  .   Disposition: Status is: Observation   Family Communication: No family at bedside. Level of care: Med-Surg Continue IV heparin.  Due to nausea. Vitals:   05/12/22 0520 05/12/22 1008 05/12/22 1249 05/12/22 1744  BP: 112/63 127/76 136/82 (!) 118/54  Pulse: 68 71 73 61  Resp: '16 17 17 17  '$ Temp: 98.1 F (36.7 C) 98.2 F (36.8 C) 97.9 F (36.6 C) 97.7 F (36.5 C)  TempSrc: Oral Oral Oral Oral  SpO2: 97% 95% 93% 97%     Author: Berle Mull, MD 05/12/2022 7:31 PM  Please look on [email or web address] to find out who is on call.

## 2022-05-13 ENCOUNTER — Other Ambulatory Visit (HOSPITAL_COMMUNITY): Payer: Self-pay

## 2022-05-13 DIAGNOSIS — I2699 Other pulmonary embolism without acute cor pulmonale: Secondary | ICD-10-CM | POA: Diagnosis not present

## 2022-05-13 LAB — COMPREHENSIVE METABOLIC PANEL
ALT: 33 U/L (ref 0–44)
AST: 27 U/L (ref 15–41)
Albumin: 3.8 g/dL (ref 3.5–5.0)
Alkaline Phosphatase: 61 U/L (ref 38–126)
Anion gap: 8 (ref 5–15)
BUN: 9 mg/dL (ref 6–20)
CO2: 26 mmol/L (ref 22–32)
Calcium: 8.9 mg/dL (ref 8.9–10.3)
Chloride: 106 mmol/L (ref 98–111)
Creatinine, Ser: 0.74 mg/dL (ref 0.44–1.00)
GFR, Estimated: >60 mL/min (ref >60)
Glucose, Bld: 96 mg/dL (ref 70–99)
Potassium: 3.9 mmol/L (ref 3.5–5.1)
Sodium: 140 mmol/L (ref 135–145)
Total Bilirubin: 0.5 mg/dL (ref 0.3–1.2)
Total Protein: 6.6 g/dL (ref 6.5–8.1)

## 2022-05-13 LAB — CBC WITH DIFFERENTIAL/PLATELET
Abs Immature Granulocytes: 0.01 10*3/uL (ref 0.00–0.07)
Basophils Absolute: 0 10*3/uL (ref 0.0–0.1)
Basophils Relative: 1 %
Eosinophils Absolute: 0.2 10*3/uL (ref 0.0–0.5)
Eosinophils Relative: 4 %
HCT: 40.8 % (ref 36.0–46.0)
Hemoglobin: 12.9 g/dL (ref 12.0–15.0)
Immature Granulocytes: 0 %
Lymphocytes Relative: 49 %
Lymphs Abs: 1.8 10*3/uL (ref 0.7–4.0)
MCH: 28.9 pg (ref 26.0–34.0)
MCHC: 31.6 g/dL (ref 30.0–36.0)
MCV: 91.5 fL (ref 80.0–100.0)
Monocytes Absolute: 0.3 10*3/uL (ref 0.1–1.0)
Monocytes Relative: 7 %
Neutro Abs: 1.4 10*3/uL — ABNORMAL LOW (ref 1.7–7.7)
Neutrophils Relative %: 39 %
Platelets: 256 10*3/uL (ref 150–400)
RBC: 4.46 MIL/uL (ref 3.87–5.11)
RDW: 13.3 % (ref 11.5–15.5)
WBC: 3.7 10*3/uL — ABNORMAL LOW (ref 4.0–10.5)
nRBC: 0 % (ref 0.0–0.2)

## 2022-05-13 LAB — MAGNESIUM: Magnesium: 1.8 mg/dL (ref 1.7–2.4)

## 2022-05-13 LAB — PROTIME-INR
INR: 1 (ref 0.8–1.2)
Prothrombin Time: 12.8 seconds (ref 11.4–15.2)

## 2022-05-13 LAB — HEPARIN LEVEL (UNFRACTIONATED): Heparin Unfractionated: 0.49 IU/mL (ref 0.30–0.70)

## 2022-05-13 MED ORDER — SACCHAROMYCES BOULARDII 250 MG PO CAPS
250.0000 mg | ORAL_CAPSULE | Freq: Two times a day (BID) | ORAL | 0 refills | Status: AC
  Filled 2022-05-13: qty 12, 6d supply, fill #0

## 2022-05-13 MED ORDER — RIVAROXABAN 10 MG PO TABS: 20.0000 mg | ORAL_TABLET | Freq: Every day | ORAL | Status: DC

## 2022-05-13 MED ORDER — METHOCARBAMOL 500 MG PO TABS
500.0000 mg | ORAL_TABLET | Freq: Three times a day (TID) | ORAL | 0 refills | Status: DC
  Filled 2022-05-13: qty 30, 10d supply, fill #0

## 2022-05-13 MED ORDER — APIXABAN 5 MG PO TABS
10.0000 mg | ORAL_TABLET | Freq: Two times a day (BID) | ORAL | Status: DC
  Administered 2022-05-13: 10 mg via ORAL
  Filled 2022-05-13: qty 2

## 2022-05-13 MED ORDER — APIXABAN 5 MG PO TABS: 5.0000 mg | ORAL_TABLET | Freq: Two times a day (BID) | ORAL | Status: DC

## 2022-05-13 MED ORDER — ONDANSETRON HCL 4 MG PO TABS
4.0000 mg | ORAL_TABLET | Freq: Three times a day (TID) | ORAL | 0 refills | Status: DC | PRN
  Filled 2022-05-13: qty 20, 7d supply, fill #0

## 2022-05-13 MED ORDER — OXYCODONE-ACETAMINOPHEN 5-325 MG PO TABS
1.0000 | ORAL_TABLET | Freq: Three times a day (TID) | ORAL | 0 refills | Status: DC | PRN
  Filled 2022-05-13 (×2): qty 15, 5d supply, fill #0

## 2022-05-13 MED ORDER — RIVAROXABAN 15 MG PO TABS: 15.0000 mg | ORAL_TABLET | Freq: Two times a day (BID) | ORAL | Status: DC

## 2022-05-13 MED ORDER — APIXABAN (ELIQUIS) VTE STARTER PACK (10MG AND 5MG)
ORAL_TABLET | ORAL | 0 refills | Status: DC
  Filled 2022-05-13: qty 74, 30d supply, fill #0

## 2022-05-13 MED ORDER — SACCHAROMYCES BOULARDII 250 MG PO CAPS
250.0000 mg | ORAL_CAPSULE | Freq: Two times a day (BID) | ORAL | Status: DC
  Administered 2022-05-13: 250 mg via ORAL
  Filled 2022-05-13: qty 1

## 2022-05-13 NOTE — Progress Notes (Signed)
Pt and husband given d/c instructions and all questions answered. Pharmacy gave detailed instructions on outpatient Eliquis. Pt taken via wheelchair by NT to front entrance to meet ride.

## 2022-05-13 NOTE — Telephone Encounter (Signed)
See mychart.

## 2022-05-13 NOTE — Progress Notes (Signed)
ANTICOAGULATION CONSULT NOTE - Follow Up Consult  Pharmacy Consult for Heparin Indication: pulmonary embolus  Allergies  Allergen Reactions   Effexor [Venlafaxine] Nausea Only    Rash    Rosuvastatin Nausea Only   Tape Itching and Other (See Comments)    Skin itches if tape is left on for awhile   Tramadol Nausea And Vomiting   Norco [Hydrocodone-Acetaminophen] Rash   Voltaren [Diclofenac] Rash    Patient Measurements:   Heparin Dosing Weight: 88.5 kg  Vital Signs: Temp: 98.2 F (36.8 C) (01/24 2157) Temp Source: Oral (01/24 2157) BP: 107/67 (01/24 2157) Pulse Rate: 64 (01/24 2157)  Labs: Recent Labs    05/10/22 2136 05/11/22 1140 05/11/22 1517 05/11/22 1816 05/11/22 2257 05/12/22 0436 05/12/22 0930 05/12/22 2021 05/13/22 0435  HGB 13.4 13.7  --   --   --  12.2  --   --  12.9  HCT 41.0 42.0  --   --   --  38.7  --   --  40.8  PLT 295 292  --   --   --  245  --   --  256  HEPARINUNFRC  --   --   --   --    < >  --  0.83* 0.55 0.49  CREATININE 0.69 0.72  --   --   --  0.97  --   --   --   TROPONINIHS  --   --  2 3  --   --   --   --   --    < > = values in this interval not displayed.     Estimated Creatinine Clearance: 96.1 mL/min (by C-G formula based on SCr of 0.97 mg/dL).   Assessment: 29 yof with a history of SLE, Chiari malformation, depression, migraines, hyperlipidemia. Patient is presenting with right-sided abdominal and flank pain. Heparin per pharmacy consult placed for pulmonary embolus.   CTA PE positive for PE w/out RHS   Patient is not on anticoagulation prior to arrival.  Today, 05/13/2022: CBC: stable WNL Confirmatory heparin level 0.49 on 1150 units/hr SCr stable; at baseline No bleeding or infusion issues noted  Goal of Therapy:  Heparin level 0.3-0.7 units/ml Monitor platelets by anticoagulation protocol: Yes   Plan:  Continue heparin at 1150 units/hr Daily HL and CBC F/u [facility]   Dolly Rias RPh 05/13/2022, 5:00 AM

## 2022-05-14 ENCOUNTER — Other Ambulatory Visit (HOSPITAL_COMMUNITY): Payer: Self-pay

## 2022-05-14 ENCOUNTER — Encounter: Payer: Self-pay | Admitting: Pulmonary Disease

## 2022-05-14 NOTE — Discharge Summary (Signed)
Physician Discharge Summary   Patient: Kim Pham MRN: [id number] DOB: [birth year]  Admit date:     05/11/2022  Discharge date: 05/13/2022  Discharge Physician: Berle Mull  PCP: Debbrah Alar, NP  Recommendations at discharge:  Follow up with PCP in 1 week   Follow-up Information     Debbrah Alar, NP. Schedule an appointment as soon as possible for a visit .   Specialty: Internal Medicine Why: check hypercoagulable panel after 1 month. Contact information: [address] [place] [address] [phone]         Surgery, [place] .   Specialty: General Surgery Contact information: [address] [address] [phone]                Discharge Diagnoses: Principal Problem:   Pulmonary embolism ([place]) Active Problems:   SLE (systemic lupus erythematosus) (H[facility])   Obesity (BMI 30.0-34.9)   Subclinical hypothyroidism  Hospital Course: PMH of lupus, GERD, migration, depression present to the hospital with complaints of right-sided flank pain ongoing for last few days associated with severe nausea as well as diarrhea.  She also reports some fever prior to coming to the hospital.  Found to have a PE, thickened gallbladder with normal LFTs. Assessment and Plan  Acute pulmonary embolism. Unprovoked?  Right-sided small PE.  No infarct seen on the CT scan. Patient continues to have active tenderness. Maintaining good saturation on ambulation. Lower extremity Doppler negative for DVT. History of blood clots in both father and mother in their 50s. She does have history of SLE. This puts the patient at high risk for DVT PE but otherwise no significant risk factors, recent procedures, recent immobilization, recent medication. Was on IV heparin. Will transition to DOAC's.   Recommended outpt hypercoagulable panel after 1 month.   SLE On observation  Recommended antiphospholipid ab work up outpt.  Risk  factor for PE  Intractable nausea and vomiting. Will continue supportive medications. Etiology not clear since the CT does not show any evidence of acute abnormality. Possibility of a viral gastroenteritis cannot be ruled out. Now resolved and able to tolerate oral diet   Diarrhea. Similarly above possibility of a viral gastroenteritis cannot be ruled out. Resolved    Thickened gallbladder. Patient does have evidence of thickened gallbladder on the ultrasound. LFTs are normal.  No leukocytosis. No CBD dilation or any other acute findings. At present further workup is not indicated. Outpt follow up with General surgery recommended as needed   Hypothyroidism. Patient is on Synthroid and Cytomel. Unclear why she is on both medication when the Synthroid is not maxed out. For now we will continue the same and recommended follow up with PCP   Obesity. BMI 33.23. Placing the patient at high risk for poor outcome.  Certainly a risk factor for PE. Monitor.   Phleboliths. Appendicolith. No evidence of acute abnormality related to this finding.  No surrounding stranding in the soft tissue.  LLQ tenderness  No ct evidence  of any abnormality and no lab abnormality  Recommended outpt follow up with PCP  Leucopenia  Mild, likely dilutional, no other acute abnormality. Follow up  Consultants:  none  Procedures performed:  Echocardiogram   DISCHARGE MEDICATION: Allergies as of 05/13/2022       Reactions   Effexor [venlafaxine] Nausea Only   Rash    Rosuvastatin Nausea Only   Tape Itching, Other (See Comments)   Skin itches if tape is left on  for awhile   Tramadol Nausea And Vomiting   Norco [hydrocodone-acetaminophen] Rash   Voltaren [diclofenac] Rash        Medication List     STOP taking these medications    ibuprofen 200 MG tablet Commonly known as: ADVIL   meloxicam 15 MG tablet Commonly known as: MOBIC       TAKE these medications    benzonatate 100 MG  capsule Commonly known as: TESSALON Take 1 capsule (100 mg total) by mouth 3 (three) times daily as needed for cough.   Eliquis DVT/PE Starter Pack Generic drug: Apixaban Starter Pack ('10mg'$  and '5mg'$ ) Take as directed on package: start with two-'5mg'$  tablets twice daily for 7 days. On day 8, switch to one-'5mg'$  tablet twice daily.   fluticasone 50 MCG/ACT nasal spray Commonly known as: FLONASE Place 2 sprays into both nostrils daily.   fluticasone-salmeterol 115-21 MCG/ACT inhaler Commonly known as: Advair HFA Inhale 2 puffs into the lungs 2 (two) times daily.   gabapentin 300 MG capsule Commonly known as: NEURONTIN TAKE ONE CAPSULE BY MOUTH AT BEDTIME   levothyroxine 25 MCG tablet Commonly known as: SYNTHROID Take 37.5 mcg by mouth daily before breakfast.   liothyronine 5 MCG tablet Commonly known as: CYTOMEL Take 10 mcg by mouth in the morning.   LORazepam 1 MG tablet Commonly known as: ATIVAN Take 1 tablet by mouth 30 minutes prior to MRI   Melatonin 10 MG Tabs Take 20 mg by mouth at bedtime.   methocarbamol 500 MG tablet Commonly known as: ROBAXIN Take 1 tablet (500 mg total) by mouth 3 (three) times daily.   ondansetron 4 MG tablet Commonly known as: Zofran Take 1 tablet (4 mg total) by mouth every 8 (eight) hours as needed for nausea or vomiting.   oxyCODONE-acetaminophen 5-325 MG tablet Commonly known as: Percocet Take 1 tablet by mouth every 8 (eight) hours as needed for severe pain or moderate pain. What changed:  when to take this reasons to take this   pantoprazole 40 MG tablet Commonly known as: PROTONIX Take 1 tablet (40 mg total) by mouth 2 (two) times daily.   PARoxetine 20 MG tablet Commonly known as: PAXIL Take 1 tablet (20 mg total) by mouth at bedtime.   rosuvastatin 20 MG tablet Commonly known as: CRESTOR Take 1 tablet (20 mg total) by mouth daily.   saccharomyces boulardii 250 MG capsule Commonly known as: FLORASTOR Take 1 capsule (250 mg  total) by mouth 2 (two) times daily for 6 days.   SUMAtriptan 100 MG tablet Commonly known as: IMITREX Take 1 tablet (100 mg total) by mouth at onset of migraine. May repeat in 2 hours if headache persists or recurs. Do not take more than 2 doses in 24 hours. What changed:  how much to take how to take this when to take this reasons to take this additional instructions   traZODone 100 MG tablet Commonly known as: DESYREL TAKE TWO TABLETS BY MOUTH EVERY NIGHT AT BEDTIME What changed: See the new instructions.       Disposition: Home Diet recommendation: Regular diet  Discharge Exam: Vitals:   05/12/22 1249 05/12/22 1744 05/12/22 2157 05/13/22 0522  BP: 136/82 (!) 118/54 107/67 (!) 99/58  Pulse: 73 61 64 61  Resp: '17 17 16 14  '$ Temp: 97.9 F (36.6 C) 97.7 F (36.5 C) 98.2 F (36.8 C) 98 F (36.7 C)  TempSrc: Oral Oral Oral Oral  SpO2: 93% 97% 96% 99%   General: Appear in  no distress; no visible Abnormal Neck Mass Or lumps, Conjunctiva normal Cardiovascular: S1 and S2 Present, no Murmur, Respiratory: good respiratory effort, Bilateral Air entry present and CTA, no Crackles, no wheezes Abdomen: Bowel Sound present, right upper quadrant tenderness, mild tenderness in LLQ as well improving Extremities: no Pedal edema Neurology: alert and oriented to time, place, and person   Condition at discharge: stable  The results of significant diagnostics from this hospitalization (including imaging, microbiology, ancillary and laboratory) are listed below for reference.   Imaging Studies: VAS [place] LOWER EXTREMITY VENOUS (DVT)  Result Date: 05/12/2022  Lower Venous DVT Study Patient Name:  ETSUKO DIEROLF  Date of Exam:   05/12/2022 Medical Rec #: [id number]         Accession #:    [id number] Date of Birth: 04-02-1981        Patient Gender: F Patient Age:   42 years Exam Location:  [facility] Procedure:      VAS [place] LOWER EXTREMITY VENOUS (DVT) Referring Phys: Nyoka Lint  DOUTOVA --------------------------------------------------------------------------------  Indications: Pulmonary embolism.  Comparison Study: Previous RLEV on 04/21/21 was negative for DVT Performing Technologist: Rogelia Rohrer RVT, RDMS  Examination Guidelines: A complete evaluation includes B-mode imaging, spectral Doppler, color Doppler, and power Doppler as needed of all accessible portions of each vessel. Bilateral testing is considered an integral part of a complete examination. Limited examinations for reoccurring indications may be performed as noted. The reflux portion of the exam is performed with the patient in reverse Trendelenburg.  +---------+---------------+---------+-----------+----------+--------------+ RIGHT    CompressibilityPhasicitySpontaneityPropertiesThrombus Aging +---------+---------------+---------+-----------+----------+--------------+ CFV      Full           Yes      Yes                                 +---------+---------------+---------+-----------+----------+--------------+ SFJ      Full                                                        +---------+---------------+---------+-----------+----------+--------------+ FV Prox  Full           Yes      Yes                                 +---------+---------------+---------+-----------+----------+--------------+ FV Mid   Full           Yes      Yes                                 +---------+---------------+---------+-----------+----------+--------------+ FV DistalFull           Yes      Yes                                 +---------+---------------+---------+-----------+----------+--------------+ PFV      Full                                                        +---------+---------------+---------+-----------+----------+--------------+  POP      Full           Yes      Yes                                 +---------+---------------+---------+-----------+----------+--------------+ PTV       Full                                                        +---------+---------------+---------+-----------+----------+--------------+ PERO     Full                                                        +---------+---------------+---------+-----------+----------+--------------+   +---------+---------------+---------+-----------+----------+--------------+ LEFT     CompressibilityPhasicitySpontaneityPropertiesThrombus Aging +---------+---------------+---------+-----------+----------+--------------+ CFV      Full           Yes      Yes                                 +---------+---------------+---------+-----------+----------+--------------+ SFJ      Full                                                        +---------+---------------+---------+-----------+----------+--------------+ FV Prox  Full           Yes      Yes                                 +---------+---------------+---------+-----------+----------+--------------+ FV Mid   Full           Yes      Yes                                 +---------+---------------+---------+-----------+----------+--------------+ FV DistalFull           Yes      Yes                                 +---------+---------------+---------+-----------+----------+--------------+ PFV      Full                                                        +---------+---------------+---------+-----------+----------+--------------+ POP      Full           Yes      Yes                                 +---------+---------------+---------+-----------+----------+--------------+ PTV  Full                                                        +---------+---------------+---------+-----------+----------+--------------+ PERO     Full                                                        +---------+---------------+---------+-----------+----------+--------------+     Summary: BILATERAL: - No evidence of deep vein  thrombosis seen in the lower extremities, bilaterally. -No evidence of popliteal cyst, bilaterally.   *See table(s) above for measurements and observations. Electronically signed by Harold Barban MD on 05/12/2022 at 8:58:06 PM.    Final    ECHOCARDIOGRAM COMPLETE  Result Date: 05/12/2022    ECHOCARDIOGRAM REPORT   Patient Name:   KIRAT MEZQUITA Date of Exam: 05/12/2022 Medical Rec #:  [id number]        Height:       69.0 in Accession #:    [id number]       Weight:       225.0 lb Date of Birth:  1980/08/30       BSA:          2.172 m Patient Age:    89 years         BP:           136/82 mmHg Patient Gender: F                HR:           66 bpm. Exam Location:  Inpatient Procedure: 2D Echo, Color Doppler, Cardiac Doppler and Intracardiac            Opacification Agent Indications:    Pulmonary embolus  History:        Patient has prior history of Echocardiogram examinations, most                 recent 12/22/2021. Risk Factors:Dyslipidemia. SLE, hx of COVID.  Sonographer:    Eartha Inch Referring Phys: [id number] ANASTASSIA DOUTOVA  Sonographer Comments: Technically difficult study due to poor echo windows. Image acquisition challenging due to patient body habitus and Image acquisition challenging due to respiratory motion. IMPRESSIONS  1. Left ventricular ejection fraction, by estimation, is 60 to 65%. The left ventricle has normal function. The left ventricle has no regional wall motion abnormalities. Left ventricular diastolic parameters were normal.  2. Right ventricular systolic function is normal. The right ventricular size is normal.  3. The mitral valve is normal in structure. No evidence of mitral valve regurgitation. No evidence of mitral stenosis.  4. The aortic valve is tricuspid. Aortic valve regurgitation is not visualized. No aortic stenosis is present.  5. The inferior vena cava is normal in size with greater than 50% respiratory variability, suggesting right atrial pressure of 3 mmHg. FINDINGS  Left  Ventricle: Left ventricular ejection fraction, by estimation, is 60 to 65%. The left ventricle has normal function. The left ventricle has no regional wall motion abnormalities. Definity contrast agent was given IV to delineate the left ventricular  endocardial borders. The left ventricular internal cavity size was normal in size. There is no left ventricular  hypertrophy. Left ventricular diastolic parameters were normal. Right Ventricle: The right ventricular size is normal. No increase in right ventricular wall thickness. Right ventricular systolic function is normal. Left Atrium: Left atrial size was normal in size. Right Atrium: Right atrial size was normal in size. Pericardium: There is no evidence of pericardial effusion. Mitral Valve: The mitral valve is normal in structure. No evidence of mitral valve regurgitation. No evidence of mitral valve stenosis. Tricuspid Valve: The tricuspid valve is normal in structure. Tricuspid valve regurgitation is mild . No evidence of tricuspid stenosis. Aortic Valve: The aortic valve is tricuspid. Aortic valve regurgitation is not visualized. No aortic stenosis is present. Pulmonic Valve: The pulmonic valve was normal in structure. Pulmonic valve regurgitation is not visualized. No evidence of pulmonic stenosis. Aorta: The aortic root is normal in size and structure. Venous: The inferior vena cava is normal in size with greater than 50% respiratory variability, suggesting right atrial pressure of 3 mmHg. IAS/Shunts: No atrial level shunt detected by color flow Doppler.  LEFT VENTRICLE PLAX 2D LVIDd:         5.30 cm     Diastology LVIDs:         3.70 cm     LV e' medial:    9.14 cm/s LV PW:         0.70 cm     LV E/e' medial:  9.6 LV IVS:        0.50 cm     LV e' lateral:   12.50 cm/s LVOT diam:     2.50 cm     LV E/e' lateral: 7.0 LV SV:         123 LV SV Index:   56 LVOT Area:     4.91 cm  LV Volumes (MOD) LV vol d, MOD A2C: 66.5 ml LV vol d, MOD A4C: 96.7 ml LV vol s, MOD  A2C: 22.7 ml LV vol s, MOD A4C: 40.0 ml LV SV MOD A2C:     43.8 ml LV SV MOD A4C:     96.7 ml LV SV MOD BP:      48.9 ml RIGHT VENTRICLE             IVC RV S prime:     11.30 cm/s  IVC diam: 2.00 cm TAPSE (M-mode): 2.0 cm LEFT ATRIUM             Index        RIGHT ATRIUM           Index LA diam:        3.60 cm 1.66 cm/m   RA Area:     18.50 cm LA Vol (A2C):   76.8 ml 35.36 ml/m  RA Volume:   50.50 ml  23.25 ml/m LA Vol (A4C):   68.2 ml 31.40 ml/m LA Biplane Vol: 73.7 ml 33.93 ml/m  AORTIC VALVE LVOT Vmax:   99.90 cm/s LVOT Vmean:  74.800 cm/s LVOT VTI:    0.250 m  AORTA Ao Root diam: 3.50 cm Ao Asc diam:  3.30 cm MITRAL VALVE               TRICUSPID VALVE MV Area (PHT): 3.72 cm    TR Peak grad:   14.4 mmHg MV Decel Time: 204 msec    TR Vmax:        190.00 cm/s MV E velocity: 87.40 cm/s MV A velocity: 63.80 cm/s  SHUNTS MV E/A ratio:  1.37  Systemic VTI:  0.25 m                            Systemic Diam: 2.50 cm Jenkins Rouge MD Electronically signed by Jenkins Rouge MD Signature Date/Time: 05/12/2022/2:35:25 PM    Final    CT Angio Chest PE W and/or Wo Contrast  Result Date: 05/11/2022 CLINICAL DATA:  High probability of pulmonary embolus. Persistent RIGHT flank pain and emesis. EXAM: CT ANGIOGRAPHY CHEST WITH CONTRAST TECHNIQUE: Multidetector CT imaging of the chest was performed using the standard protocol during bolus administration of intravenous contrast. Multiplanar CT image reconstructions and MIPs were obtained to evaluate the vascular anatomy. RADIATION DOSE REDUCTION: This exam was performed according to the departmental dose-optimization program which includes automated exposure control, adjustment of the mA and/or kV according to patient size and/or use of iterative reconstruction technique. CONTRAST:  38m OMNIPAQUE IOHEXOL 350 MG/ML SOLN COMPARISON:  04/23/2021 FINDINGS: Cardiovascular: Pulmonary arteries are well opacified by contrast bolus. There are small pulmonary emboli within the  RIGHT LOWER lobe posterior basal segmental branch of the pulmonary artery. (Image 149 and 170 of series 6). The heart size is normal no pericardial effusion. Thoracic aorta is unremarkable. Mediastinum/Nodes: The visualized portion of the thyroid gland has a normal appearance. No significant mediastinal, hilar, or axillary adenopathy. Esophagus is unremarkable. Lungs/Pleura: Lungs are clear. No pleural effusion or pneumothorax. Upper Abdomen: No acute abnormality. Musculoskeletal: No chest wall abnormality. No acute or significant osseous findings. Review of the MIP images confirms the above findings. IMPRESSION: 1. Small pulmonary emboli within the RIGHT LOWER lobe posterior basal segmental branch of the pulmonary artery. 2. No evidence for RIGHT heart strain. Critical Value/emergent results were called by telephone at the time of interpretation on 05/11/2022 at 3:14 pm [facility] verbally acknowledged these results. Electronically Signed   By: ENolon NationsM.D.   On: 05/11/2022 15:14   CT ABDOMEN PELVIS W CONTRAST  Result Date: 05/11/2022 CLINICAL DATA:  Abdominal/flank pain, stone suspected. Right upper quadrant pain and vomiting since yesterday. EXAM: CT ABDOMEN AND PELVIS WITH CONTRAST TECHNIQUE: Multidetector CT imaging of the abdomen and pelvis was performed using the standard protocol following bolus administration of intravenous contrast. RADIATION DOSE REDUCTION: This exam was performed according to the departmental dose-optimization program which includes automated exposure control, adjustment of the mA and/or kV according to patient size and/or use of iterative reconstruction technique. CONTRAST:  107mOMNIPAQUE IOHEXOL 300 MG/ML  SOLN COMPARISON:  CT abdomen/pelvis 06/05/2021. Right upper quadrant ultrasound 05/10/2022. FINDINGS: Lower chest: Clear lung bases.  No pleural or pericardial effusion. Hepatobiliary: No focal liver abnormality is seen. No radiopaque gallstones,  pericholecystic fat stranding, or biliary dilatation. Pancreas: Unremarkable. No pancreatic ductal dilatation or surrounding inflammatory changes. Spleen: Normal. Adrenals/Urinary Tract: Adrenal glands are unremarkable. Kidneys are normal, without renal calculi, focal lesion, or hydronephrosis. No hydroureter. Numerous phleboliths in the pelvis. Bladder is unremarkable. Stomach/Bowel: Normal stomach and duodenum. No dilated small bowel loops. Appendix is visualized on image [id number] 2 with newly increased density in the base and tip, possibly representing developing appendicolith or retained hyperdense material. Normal size. No surrounding inflammation. Mild diverticulosis of the transverse colon without evidence of acute diverticulitis. Sigmoid colon and rectum are unremarkable. Vascular/Lymphatic: No significant vascular findings are present. No enlarged abdominal or pelvic lymph nodes. Reproductive: Status post hysterectomy. No adnexal masses. Other: No abdominal wall hernia or abnormality. No abdominopelvic ascites. Musculoskeletal: Transitional lumbosacral  anatomy with sacralization of L5. No suspicious bone lesions. IMPRESSION: 1. No acute abdominal/pelvic pathology. No renal/ureteral calculi or obstructive uropathy. 2. Increased density in the appendiceal base and tip, possibly developing appendicolith or retained hyperdense material. No evidence of acute appendicitis. 3. Mild colonic diverticulosis without evidence of acute diverticulitis. 4. Transitional lumbosacral anatomy with sacralization of L5. Electronically Signed   By: Emmit Alexanders M.D.   On: 05/11/2022 13:35   US Abdomen Limited RUQ (LIVER/GB)  Result Date: 05/10/2022 CLINICAL DATA:  Right upper quadrant pain EXAM: ULTRASOUND ABDOMEN LIMITED RIGHT UPPER QUADRANT COMPARISON:  CT 06/05/2021 FINDINGS: Gallbladder: Contracted gallbladder. Small polyps measuring up to 4 mm. Increased wall thickness at 3.9 mm. Negative sonographic Murphy. Common  bile duct: Diameter: 2.5 mm Liver: No focal lesion identified. Within normal limits in parenchymal echogenicity. Portal vein is patent on color Doppler imaging with normal direction of blood flow towards the liver. Other: None. IMPRESSION: 1. Contracted gallbladder with small polyps. Increased wall thickness at 3.9 mm likely related to under distension. Negative sonographic Murphy. 2. Otherwise negative right upper quadrant abdominal ultrasound. Electronically Signed   By: Donavan Foil M.D.   On: 05/10/2022 23:51   MR BREAST BILATERAL W WO CONTRAST INC CAD  Result Date: 05/10/2022 CLINICAL DATA:  42 year old female with new clear, spontaneous left nipple discharge. Recent mammographic and sonographic evaluation showed no associated findings. EXAM: BILATERAL BREAST MRI WITH AND WITHOUT CONTRAST TECHNIQUE: Multiplanar, multisequence MR images of both breasts were obtained prior to and following the intravenous administration of 10 ml of Vueway. Three-dimensional MR images were rendered by post-processing of the original MR data on an independent workstation. The three-dimensional MR images were interpreted, and findings are reported in the following complete MRI report for this study. Three dimensional images were evaluated at the independent interpreting workstation using the DynaCAD thin client. COMPARISON:  Previous exam(s). FINDINGS: Breast composition: b. Scattered fibroglandular tissue. Background parenchymal enhancement: Mild. Right breast: No suspicious mass or abnormal enhancement. Left breast: No suspicious mass or abnormal enhancement. Specifically, there are no enhancing masses, ducts are duct ectasia within the subareolar left breast. Additionally, there are no enhancing findings within the lateral left breast. Lymph nodes: No abnormal appearing lymph nodes. Ancillary findings:  None. IMPRESSION: Unremarkable MRI evaluation of the bilateral breasts. RECOMMENDATION: Surgical referral for left nipple  discharge and bilateral diagnostic mammogram in June 2024 per diagnostic report dated 04/14/2022. BI-RADS CATEGORY  1: Negative. Electronically Signed   By: Kristopher Oppenheim M.D.   On: 05/10/2022 08:49  US THYROID  Result Date: 05/04/2022 CLINICAL DATA:  Hoarseness and choking with food. History Hashimoto's thyroiditis and hypothyroidism. EXAM: THYROID ULTRASOUND TECHNIQUE: Ultrasound examination of the thyroid gland and adjacent soft tissues was performed. COMPARISON:  None Available. FINDINGS: Parenchymal Echotexture: Moderately heterogenous Isthmus: 0.4 cm Right lobe: 5.6 x 1.8 x 1.3 cm Left lobe: 4.7 x 1.8 x 1.3 cm _________________________________________________________ Estimated total number of nodules >/= 1 cm: 1 Number of spongiform nodules >/=  2 cm not described below (TR1): 0 Number of mixed cystic and solid nodules >/= 1.5 cm not described below (TR2): 0 _________________________________________________________ Nodule # 1: Location: Right; Mid Maximum size: 0.9 cm; Other 2 dimensions: 0.8 x 0.9 cm Composition: solid/almost completely solid (2) Echogenicity: hypoechoic (2) Shape: not taller-than-wide (0) Margins: ill-defined (0) Echogenic foci: none (0) ACR TI-RADS total points: 4. ACR TI-RADS risk category: TR4 (4-6 points). ACR TI-RADS recommendations: Given size (</=0.9 cm) and appearance, this nodule does NOT meet TI-RADS criteria for  biopsy or dedicated follow-up. _________________________________________________________ Nodule # 2: Location: Right; Inferior Maximum size: 2.4 cm; Other 2 dimensions: 1.4 x 2.1 cm Composition: solid/almost completely solid (2) Echogenicity: hyperechoic (1) Shape: not taller-than-wide (0) Margins: ill-defined (0) Echogenic foci: none (0) ACR TI-RADS total points: 3. ACR TI-RADS risk category: TR3 (3 points). ACR TI-RADS recommendations: *Given size (>/= 1.5 - 2.4 cm) and appearance, a follow-up ultrasound in 1 year should be considered based on TI-RADS criteria.  Appearance is more likely representative of a cluster of small adjacent nodules rather than 1 discrete nodule but an overall dimension will be given for follow-up purposes. _________________________________________________________ No enlarged or abnormal appearing lymph nodes are identified. IMPRESSION: Mildly enlarged and moderately heterogeneous thyroid gland. A 0.9 cm right mid thyroid nodule does not meet criteria for biopsy or follow-up. A conglomerate area likely representing a cluster of nodules measuring 2.4 cm meets criteria for follow-up ultrasound in 1 year. The above is in keeping with the ACR TI-RADS recommendations - J Am Coll Radiol 2017;14:587-595. Electronically Signed   By: Aletta Edouard M.D.   On: 05/04/2022 15:54   MM DIAG BREAST TOMO UNI LEFT  Result Date: 04/14/2022 CLINICAL DATA:  42 year old female presenting for short-term follow-up of a probably benign left breast asymmetry. Patient also reports new spontaneous left breast clear nipple discharge and a erythematous scaling red rash around the left nipple which has now resolved after steroid cream. EXAM: DIGITAL DIAGNOSTIC UNILATERAL LEFT MAMMOGRAM WITH TOMOSYNTHESIS; ULTRASOUND LEFT BREAST LIMITED TECHNIQUE: Left digital diagnostic mammography and breast tomosynthesis was performed.; Targeted ultrasound examination of the left breast was performed. COMPARISON:  Previous exam(s). ACR Breast Density Category b: There are scattered areas of fibroglandular density. FINDINGS: Mammogram: Left breast: Spot compression tomosynthesis views of the retroareolar left breast were performed in addition to standard views. There is no new abnormality in the retroareolar aspect. There is a stable small asymmetry best seen on cc view in the outer left breast. On physical exam the patient is unable to express discharge from the left nipple. There are no periareolar skin changes or nipple abnormality. Ultrasound: Targeted ultrasound performed throughout  the retroareolar aspect of the left breast demonstrating no cystic or solid mass or focally dilated duct. IMPRESSION: 1. Stable probably benign asymmetry without sonographic correlate in the outer left breast. 2. No mammographic or sonographic imaging abnormality in the retroareolar aspect to explain the patient's spontaneous left nipple discharge. RECOMMENDATION: 1.  Bilateral breast MRI. 2.  Surgical referral for left breast nipple discharge. 3. If the MRI is negative recommend diagnostic bilateral mammogram in June 2024 for the probably benign left breast asymmetry and for annual exam of the right breast. I have discussed the findings and recommendations with the patient. If applicable, a reminder letter will be sent to the patient regarding the next appointment. BI-RADS CATEGORY  3: Probably benign. Electronically Signed   By: Audie Pinto M.D.   On: 04/14/2022 15:57  US BREAST LTD UNI LEFT INC AXILLA  Result Date: 04/14/2022 CLINICAL DATA:  42 year old female presenting for short-term follow-up of a probably benign left breast asymmetry. Patient also reports new spontaneous left breast clear nipple discharge and a erythematous scaling red rash around the left nipple which has now resolved after steroid cream. EXAM: DIGITAL DIAGNOSTIC UNILATERAL LEFT MAMMOGRAM WITH TOMOSYNTHESIS; ULTRASOUND LEFT BREAST LIMITED TECHNIQUE: Left digital diagnostic mammography and breast tomosynthesis was performed.; Targeted ultrasound examination of the left breast was performed. COMPARISON:  Previous exam(s). ACR Breast Density Category b: There are  scattered areas of fibroglandular density. FINDINGS: Mammogram: Left breast: Spot compression tomosynthesis views of the retroareolar left breast were performed in addition to standard views. There is no new abnormality in the retroareolar aspect. There is a stable small asymmetry best seen on cc view in the outer left breast. On physical exam the patient is unable to  express discharge from the left nipple. There are no periareolar skin changes or nipple abnormality. Ultrasound: Targeted ultrasound performed throughout the retroareolar aspect of the left breast demonstrating no cystic or solid mass or focally dilated duct. IMPRESSION: 1. Stable probably benign asymmetry without sonographic correlate in the outer left breast. 2. No mammographic or sonographic imaging abnormality in the retroareolar aspect to explain the patient's spontaneous left nipple discharge. RECOMMENDATION: 1.  Bilateral breast MRI. 2.  Surgical referral for left breast nipple discharge. 3. If the MRI is negative recommend diagnostic bilateral mammogram in June 2024 for the probably benign left breast asymmetry and for annual exam of the right breast. I have discussed the findings and recommendations with the patient. If applicable, a reminder letter will be sent to the patient regarding the next appointment. BI-RADS CATEGORY  3: Probably benign. Electronically Signed   By: Audie Pinto M.D.   On: 04/14/2022 15:57   Microbiology: Results for orders placed or performed in visit on 10/07/21  Calprotectin, Fecal     Status: None   Collection Time: 10/07/21 11:25 AM   Specimen: Rectum; Stool   Stool  Result Value Ref Range Status   Calprotectin, Fecal 36 0 - 120 ug/g Final    Comment: Concentration     Interpretation   Follow-Up < 5 - 50 ug/g     Normal           None >50 -120 ug/g     Borderline       Re-evaluate in 4-6 weeks     >120 ug/g     Abnormal         Repeat as clinically                                    indicated    Labs: CBC: Recent Labs  Lab 05/10/22 2136 05/11/22 1140 05/12/22 0436 05/13/22 0435  WBC 5.6 4.1 5.2 3.7*  NEUTROABS  --  2.1  --  1.4*  HGB 13.4 13.7 12.2 12.9  HCT 41.0 42.0 38.7 40.8  MCV 87.8 88.2 93.3 91.5  PLT 295 292 245 127   Basic Metabolic Panel: Recent Labs  Lab 05/10/22 2136 05/11/22 1140 05/11/22 2252 05/12/22 0436 05/13/22 0435   NA 140 140  --  140 140  K 3.5 4.3  --  3.7 3.9  CL 105 104  --  104 106  CO2 23 25  --  25 26  GLUCOSE 123* 102*  --  93 96  BUN 11 11  --  15 9  CREATININE 0.69 0.72  --  0.97 0.74  CALCIUM 9.1 9.3  --  8.6* 8.9  MG  --   --  2.0 1.8 1.8  PHOS  --   --  4.8* 4.7*  --    Liver Function Tests: Recent Labs  Lab 05/10/22 2136 05/11/22 1140 05/12/22 0436 05/13/22 0435  AST 32 '31 24 27  '$ ALT 37 36 29 33  ALKPHOS 70 71 56 61  BILITOT 0.4 0.4 0.5 0.5  PROT 7.5 7.7 6.1* 6.6  ALBUMIN 4.3 4.3 3.4* 3.8   CBG: No results for input(s): "GLUCAP" in the last 168 hours.  Discharge time spent: greater than 30 minutes.  Signed: Berle Mull, MD [facility] 05/13/2022

## 2022-05-14 NOTE — Telephone Encounter (Signed)
Mychart message sent by pt: [facility] (supporting Freddi Starr, MD)41 minutes ago (12:35 PM)    Good afternoon, I was discharged yesterday from the hospital. I was admitted due to an acute PE in my right lower lung. I am now on eliquis twice daily I have a PFT scheduled for the beginning of February. Is it ok to get this test done with a PE? Please let me know. Thanks      Dr. Erin Fulling, please advise.

## 2022-05-17 ENCOUNTER — Other Ambulatory Visit (HOSPITAL_COMMUNITY): Payer: Self-pay | Admitting: General Surgery

## 2022-05-17 DIAGNOSIS — R1011 Right upper quadrant pain: Secondary | ICD-10-CM

## 2022-05-17 DIAGNOSIS — N6452 Nipple discharge: Secondary | ICD-10-CM | POA: Diagnosis not present

## 2022-05-17 DIAGNOSIS — I2699 Other pulmonary embolism without acute cor pulmonale: Secondary | ICD-10-CM

## 2022-05-17 DIAGNOSIS — Z7901 Long term (current) use of anticoagulants: Secondary | ICD-10-CM

## 2022-05-17 HISTORY — DX: Long term (current) use of anticoagulants: Z79.01

## 2022-05-17 HISTORY — DX: Other pulmonary embolism without acute cor pulmonale: I26.99

## 2022-05-18 MED ORDER — PROMETHAZINE HCL 25 MG PO TABS: 25.0000 mg | ORAL_TABLET | Freq: Three times a day (TID) | ORAL | 1 refills | Status: DC | PRN

## 2022-05-18 NOTE — Addendum Note (Signed)
Addended by: Debbrah Alar on: 05/18/2022 08:06 AM   Modules accepted: Orders

## 2022-05-19 ENCOUNTER — Encounter: Payer: Self-pay | Admitting: Family

## 2022-05-19 ENCOUNTER — Ambulatory Visit (INDEPENDENT_AMBULATORY_CARE_PROVIDER_SITE_OTHER): Payer: BC Managed Care – PPO | Admitting: Family

## 2022-05-19 ENCOUNTER — Telehealth: Payer: Self-pay | Admitting: Family

## 2022-05-19 VITALS — BP 105/61 | HR 75 | Temp 97.7°F | Ht 69.0 in | Wt 227.6 lb

## 2022-05-19 DIAGNOSIS — R1011 Right upper quadrant pain: Secondary | ICD-10-CM

## 2022-05-19 DIAGNOSIS — E039 Hypothyroidism, unspecified: Secondary | ICD-10-CM | POA: Diagnosis not present

## 2022-05-19 DIAGNOSIS — I2699 Other pulmonary embolism without acute cor pulmonale: Secondary | ICD-10-CM | POA: Diagnosis not present

## 2022-05-19 DIAGNOSIS — N6452 Nipple discharge: Secondary | ICD-10-CM

## 2022-05-19 HISTORY — DX: Nipple discharge: N64.52

## 2022-05-19 LAB — HEPATIC FUNCTION PANEL
ALT: 53 U/L — ABNORMAL HIGH (ref 0–35)
AST: 26 U/L (ref 0–37)
Albumin: 4.7 g/dL (ref 3.5–5.2)
Alkaline Phosphatase: 77 U/L (ref 39–117)
Bilirubin, Direct: 0 mg/dL (ref 0.0–0.3)
Total Bilirubin: 0.3 mg/dL (ref 0.2–1.2)
Total Protein: 7.2 g/dL (ref 6.0–8.3)

## 2022-05-19 LAB — CBC WITH DIFFERENTIAL/PLATELET
Basophils Absolute: 0 10*3/uL (ref 0.0–0.1)
Basophils Relative: 1 % (ref 0.0–3.0)
Eosinophils Absolute: 0.1 10*3/uL (ref 0.0–0.7)
Eosinophils Relative: 2.9 % (ref 0.0–5.0)
HCT: 43.3 % (ref 36.0–46.0)
Hemoglobin: 14.2 g/dL (ref 12.0–15.0)
Lymphocytes Relative: 33.4 % (ref 12.0–46.0)
Lymphs Abs: 1.5 10*3/uL (ref 0.7–4.0)
MCHC: 32.8 g/dL (ref 30.0–36.0)
MCV: 88.7 fl (ref 78.0–100.0)
Monocytes Absolute: 0.3 10*3/uL (ref 0.1–1.0)
Monocytes Relative: 7.3 % (ref 3.0–12.0)
Neutro Abs: 2.5 10*3/uL (ref 1.4–7.7)
Neutrophils Relative %: 55.4 % (ref 43.0–77.0)
Platelets: 283 10*3/uL (ref 150.0–400.0)
RBC: 4.88 Mil/uL (ref 3.87–5.11)
RDW: 14 % (ref 11.5–15.5)
WBC: 4.6 10*3/uL (ref 4.0–10.5)

## 2022-05-19 MED ORDER — LEVOTHYROXINE SODIUM 50 MCG PO TABS: 50.0000 ug | ORAL_TABLET | Freq: Every day | ORAL | 1 refills | Status: DC

## 2022-05-19 MED ORDER — OXYCODONE-ACETAMINOPHEN 5-325 MG PO TABS: 1.0000 | ORAL_TABLET | Freq: Four times a day (QID) | ORAL | 0 refills | Status: AC | PRN

## 2022-05-19 NOTE — Telephone Encounter (Signed)
Patient dropped off document to be filled out by provider FMLA. Patient requested to send it via Call Patient to pick up within ASAP. Document is located in providers tray at front office.PT [phone]

## 2022-05-19 NOTE — Assessment & Plan Note (Signed)
Ongoing, fairly severe. She has HIDA scheduled for Monday and further decision for possible cholecystectomy will be made at that time by the surgeon.  Refill provided for percocet. Reviewed [place] Controlled substance registry.

## 2022-05-19 NOTE — Assessment & Plan Note (Signed)
Was evaluated by surgeon following breast imaging and no further surgical plans at this time. Monitor.

## 2022-05-19 NOTE — Assessment & Plan Note (Signed)
New. On anticoagulation.  Clinically stable.  Will check hypercoagulable panel to further evaluate. She does have hx of SLE which may be a contributing factor.

## 2022-05-19 NOTE — Progress Notes (Signed)
Subjective:   By signing my name below, I, Shehryar Baig, attest that this documentation has been prepared under the direction and in the presence of Debbrah Alar, NP. 05/19/2022   Patient ID: Kim Pham, female    DOB: 11/15/1980, 41 y.o.   MRN: [id number]  No chief complaint on file.   HPI Patient is in today for a hospital follow up visit.   Hospital visit: She was admitted to the hospital from the ED on 05/11/2022 for right flank pain. CT noted RLL PE.  She was started on eliquis starter pack.  She continued to have RUQ pain, worse after eating and the surgeon has ordered a hida scan to further evaluate her GB.   Her current pain is a 6/10. She is requesting a refill on her pain medication. She has lost 11 lb's since her pain started. She has decreased appetite due to pain. She has shortness of breath more easily while being active. She is requesting a refill on percocet to help manage her pain.    Hypothyroid: She is not seeing an endocrinologist due to them determining she did not need further treatment. She saw an integrative doctor who placed her on cytomel. She is also taking synthroid 37.5 mcg.  Lab Results  Component Value Date   TSH 5.343 (H) 05/12/2022     Past Medical History:  Diagnosis Date   Adnexal mass 02/24/2021   in epic seen on [place]   Amenorrhea 08/14/2021   Anxiety 08/14/2021   Arthralgia 08/18/2016   Chondromalacia, patella 06/30/2021   Chronic constipation 08/14/2021   Chronic headaches HISTORY OF MIGRAINES 08/14/2021   Chronic migraine without aura, with intractable migraine, so stated, with status migrainosus 44/96/7591   Complication of anesthesia 08/14/2021   PONV   Cyst of left ovary 06/17/2021   Degenerative disc disease, lumbar 09/04/2019   Depression with anxiety 03/26/2016   Diarrhea 07/08/2017   Effusion, left knee 12/16/2021   Endometriosis    Fatigue 03/26/2016   Gastroesophageal reflux disease 08/19/2020   Hair loss  11/22/2016   High risk medication use 11/22/2016   history of Chiari malformation    History of Chiari malformation 11/22/2016   history of COVID 02/2021   LOW GRADE FEVER CONGESTION AND HEADACHE ALL SYMPTOMS RESOLVED   Hot flashes 06/11/2021   Hyperlipidemia    Insomnia 08/19/2020   Low blood pressure    pt states history of low blood pressure-fainted 12/2010-instructed to move slowly   Nonepileptic episode ([place]) 12/22/2016   Numbness and tingling 02/25/2016   OSTEOARTHRITIS 08/14/2021   Other epilepsy, intractable, without status epilepticus ([place]) 11/01/2016   Pain in right knee 11/13/2020   Photosensitivity 11/22/2016   Psychogenic nonepileptic seizure 07/27/2017   Raynaud's disease without gangrene 11/22/2016   Rectal bleeding 06/11/2021   Right lower quadrant abdominal pain 06/05/2021   Sacroiliac joint dysfunction of right side 12/19/2019   Sicca syndrome ([place]) 11/22/2016   SLE (systemic lupus erythematosus) ([place]) 03/26/2016   Subclinical hypothyroidism    Thyroid nodule    Transient alteration of awareness 01/01/2016   Wears glasses for reading 08/14/2021    Past Surgical History:  Procedure Laterality Date   ABDOMINAL HYSTERECTOMY  2007   CYSTOSCOPY  2007   WITH HYSTERECTOMY   DIAGNOSTIC LAPAROSCOPY  2005   OOPHORECTOMY Left 08/18/2021   ROTATOR CUFF REPAIR Right 2010   SALPINGOOPHORECTOMY  04/16/2011   Procedure: SALPINGO OOPHERECTOMY;  Surgeon: Lovenia Kim, MD;  Location: [facility];  Service: Gynecology;  Laterality: Right;   skull decompression surgery 2005     @ [place] History  Problem Relation Age of Onset   Hypertension Mother    CAD Mother    Breast cancer Mother    Migraines Mother    Gout Mother    Heart disease Mother    Crohn's disease Mother    Hypertension Father    CAD Father    CVA Father    Heart disease Father    Colon polyps Father    Liver disease Father    Sjogren's syndrome Sister    Celiac disease Sister     Rheum arthritis Maternal Grandmother    Dementia Maternal Grandmother    Pancreatic cancer Maternal Grandfather    CAD Paternal Grandmother    Macular degeneration Paternal Grandmother    CVA Paternal Grandfather    Autism Son    ADD / ADHD Son    Breast cancer Maternal Aunt    Esophageal cancer Neg Hx    Colon cancer Neg Hx    Stomach cancer Neg Hx     Social History   Socioeconomic History   Marital status: Married    Spouse name: Not on file   Number of children: 1   Years of education: Assoc   Highest education level: Not on file  Occupational History   Occupation: nurse  Tobacco Use   Smoking status: Former    Packs/day: 1.00    Years: 10.00    Total pack years: 10.00    Types: Cigarettes    Quit date: 04/04/2004    Years since quitting: 18.1    Passive exposure: Past   Smokeless tobacco: Never  Vaping Use   Vaping Use: Never used  Substance and Sexual Activity   Alcohol use: Not Currently   Drug use: Never   Sexual activity: Yes    Birth control/protection: Surgical  Other Topics Concern   Not on file  Social History Narrative   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   Married   1 son- born 2004 has autism   Enjoys exercise, special olympics with son   Drinks 4-5 cups of coffee a day       Right handed   Social Determinants of Health   Financial Resource Strain: Not on file  Food Insecurity: No Food Insecurity (05/11/2022)   Hunger Vital Sign    Worried About Running Out of Food in the Last Year: Never true    Ran Out of Food in the Last Year: Never true  Transportation Needs: No Transportation Needs (05/11/2022)   PRAPARE - Hydrologist (Medical): No    Lack of Transportation (Non-Medical): No  Physical Activity: Not on file  Stress: Not on file  Social Connections: Not on file  Intimate Partner Violence: Not At Risk (05/11/2022)   Humiliation, Afraid, Rape, and Kick questionnaire    Fear of Current or Ex-Partner: No     Emotionally Abused: No    Physically Abused: No    Sexually Abused: No    Outpatient Medications Prior to Visit  Medication Sig Dispense Refill   APIXABAN (ELIQUIS) VTE STARTER PACK ('10MG'$  AND '5MG'$ ) Take as directed on package: start with two-'5mg'$  tablets twice daily for 7 days. On day 8, switch to one-'5mg'$  tablet twice daily. 74 each 0   fluticasone-salmeterol (ADVAIR HFA) 115-21 MCG/ACT inhaler Inhale 2 puffs into the lungs 2 (two) times daily. 1 each 12  gabapentin (NEURONTIN) 300 MG capsule TAKE ONE CAPSULE BY MOUTH AT BEDTIME 90 capsule 1   liothyronine (CYTOMEL) 5 MCG tablet Take 10 mcg by mouth in the morning.     Melatonin 10 MG TABS Take 20 mg by mouth at bedtime.     methocarbamol (ROBAXIN) 500 MG tablet Take 1 tablet (500 mg total) by mouth 3 (three) times daily. 30 tablet 0   ondansetron (ZOFRAN) 4 MG tablet Take 1 tablet (4 mg total) by mouth every 8 (eight) hours as needed for nausea or vomiting. 20 tablet 0   PARoxetine (PAXIL) 20 MG tablet Take 1 tablet (20 mg total) by mouth at bedtime. 90 tablet 3   promethazine (PHENERGAN) 25 MG tablet Take 1 tablet (25 mg total) by mouth every 8 (eight) hours as needed for nausea or vomiting. 30 tablet 1   rosuvastatin (CRESTOR) 20 MG tablet Take 1 tablet (20 mg total) by mouth daily. (Patient not taking: Reported on 05/11/2022) 90 tablet 3   saccharomyces boulardii (FLORASTOR) 250 MG capsule Take 1 capsule (250 mg total) by mouth 2 (two) times daily for 6 days. 12 capsule 0   SUMAtriptan (IMITREX) 100 MG tablet Take 1 tablet (100 mg total) by mouth at onset of migraine. May repeat in 2 hours if headache persists or recurs. Do not take more than 2 doses in 24 hours. (Patient taking differently: Take 100 mg by mouth as needed (at onset of migraine- may repeat once in 2 hours, if no relief- max of 2 doses/24 hours).) 10 tablet 2   traZODone (DESYREL) 100 MG tablet TAKE TWO TABLETS BY MOUTH EVERY NIGHT AT BEDTIME (Patient taking differently: Take 200  mg by mouth at bedtime.) 180 tablet 1   levothyroxine (SYNTHROID) 25 MCG tablet Take 37.5 mcg by mouth daily before breakfast.     oxyCODONE-acetaminophen (PERCOCET) 5-325 MG tablet Take 1 tablet by mouth every 8 (eight) hours as needed for severe pain or moderate pain. 15 tablet 0   No facility-administered medications prior to visit.    Allergies  Allergen Reactions   Effexor [Venlafaxine] Nausea Only    Rash    Rosuvastatin Nausea Only   Tape Itching and Other (See Comments)    Skin itches if tape is left on for awhile   Tramadol Nausea And Vomiting   Norco [Hydrocodone-Acetaminophen] Rash   Voltaren [Diclofenac] Rash    Review of Systems  Constitutional:  Positive for weight loss (due to decreased appetite).  Respiratory:  Positive for shortness of breath.   Gastrointestinal:  Positive for abdominal pain (RUQ).       Objective:    Physical Exam Constitutional:      General: She is not in acute distress.    Appearance: Normal appearance. She is not ill-appearing.  HENT:     Head: Normocephalic and atraumatic.     Right Ear: External ear normal.     Left Ear: External ear normal.  Eyes:     Extraocular Movements: Extraocular movements intact.     Pupils: Pupils are equal, round, and reactive to light.  Cardiovascular:     Rate and Rhythm: Normal rate and regular rhythm.     Heart sounds: Normal heart sounds. No murmur heard.    No gallop.  Pulmonary:     Effort: Pulmonary effort is normal. No respiratory distress.     Breath sounds: Normal breath sounds. No wheezing or rales.  Abdominal:     General: Bowel sounds are decreased. There is no  distension.     Palpations: Abdomen is soft.     Tenderness: There is abdominal tenderness in the right upper quadrant and epigastric area. There is no guarding.  Skin:    General: Skin is warm and dry.  Neurological:     Mental Status: She is alert and oriented to person, place, and time.  Psychiatric:        Judgment:  Judgment normal.     BP 105/61   Pulse 75   Temp 97.7 F (36.5 C)   Ht '5\' 9"'$  (1.753 m)   Wt 227 lb 9.6 oz (103.2 kg)   SpO2 99%   BMI 33.61 kg/m  Wt Readings from Last 3 Encounters:  05/19/22 227 lb 9.6 oz (103.2 kg)  05/10/22 225 lb (102.1 kg)  05/07/22 230 lb (104.3 kg)       Assessment & Plan:  Pulmonary embolism without acute cor pulmonale, unspecified chronicity, unspecified pulmonary embolism type [facility]) Assessment & Plan: New. On anticoagulation.  Clinically stable.  Will check hypercoagulable panel to further evaluate. She does have hx of SLE which may be a contributing factor.   Orders: -     Hypercoagulable panel, comprehensive -     CBC with Differential/Platelet  Hypothyroidism, unspecified type -     Levothyroxine Sodium; Take 1 tablet (50 mcg total) by mouth daily.  Dispense: 90 tablet; Refill: 1  RUQ pain Assessment & Plan: Ongoing, fairly severe. She has HIDA scheduled for Monday and further decision for possible cholecystectomy will be made at that time by the surgeon.  Refill provided for percocet. Reviewed [place] Controlled substance registry.   Orders: -     Hepatic function panel  Nipple discharge Assessment & Plan: Was evaluated by surgeon following breast imaging and no further surgical plans at this time. Monitor.    Other orders -     oxyCODONE-Acetaminophen; Take 1 tablet by mouth every 6 (six) hours as needed for up to 5 days for severe pain.  Dispense: 20 tablet; Refill: 0    I, Nance Pear, NP, personally preformed the services described in this documentation.  All medical record entries made by the scribe were at my direction and in my presence.  I have reviewed the chart and discharge instructions (if applicable) and agree that the record reflects my personal performance and is accurate and complete. 05/19/2022   I,Shehryar Baig,acting as a Education administrator for Nance Pear, NP.,have documented all relevant documentation on the  behalf of Nance Pear, NP,as directed by  Nance Pear, NP while in the presence of Nance Pear, NP.   Nance Pear, NP

## 2022-05-19 NOTE — Telephone Encounter (Signed)
Forms taken by provider to be filled out. She will be back on Friday.

## 2022-05-21 DIAGNOSIS — R002 Palpitations: Secondary | ICD-10-CM | POA: Diagnosis not present

## 2022-05-24 ENCOUNTER — Encounter (HOSPITAL_COMMUNITY)
Admission: RE | Admit: 2022-05-24 | Discharge: 2022-05-24 | Disposition: A | Discharge status: Home / Self Care | Payer: BC Managed Care – PPO | Source: Ambulatory Visit | Attending: General Surgery | Admitting: General Surgery

## 2022-05-24 DIAGNOSIS — K838 Other specified diseases of biliary tract: Secondary | ICD-10-CM | POA: Diagnosis not present

## 2022-05-24 DIAGNOSIS — I493 Ventricular premature depolarization: Secondary | ICD-10-CM | POA: Diagnosis not present

## 2022-05-24 DIAGNOSIS — I491 Atrial premature depolarization: Secondary | ICD-10-CM | POA: Diagnosis not present

## 2022-05-24 DIAGNOSIS — R1011 Right upper quadrant pain: Secondary | ICD-10-CM | POA: Diagnosis not present

## 2022-05-24 DIAGNOSIS — I441 Atrioventricular block, second degree: Secondary | ICD-10-CM | POA: Diagnosis not present

## 2022-05-24 MED ORDER — TECHNETIUM TC 99M MEBROFENIN IV KIT
5.0000 | PACK | Freq: Once | INTRAVENOUS | Status: AC | PRN
  Administered 2022-05-24: 5 via INTRAVENOUS

## 2022-05-25 ENCOUNTER — Telehealth: Payer: Self-pay | Admitting: Family

## 2022-05-25 ENCOUNTER — Other Ambulatory Visit (HOSPITAL_COMMUNITY): Payer: Self-pay

## 2022-05-25 ENCOUNTER — Ambulatory Visit: Payer: BC Managed Care – PPO | Admitting: Primary Care

## 2022-05-25 NOTE — Telephone Encounter (Signed)
Kim Pham with [facility] (Dr. Barry Dienes) calling to make sure we received a surgery clearance for patient. Clearance located in Media tab. Pt scheduled on 05/26/22. Kim Pham is requesting written clearance advising of eliquis holding instructions and whether provider wants to do lovenox bridge or not to patient's recent history of pulmonary embolism. Please fax to [phone]. Copy of clearance request in provider's box.

## 2022-05-25 NOTE — Progress Notes (Deleted)
$'@Patient'k$  ID: Kim Pham, female    DOB: [birth year], 42 y.o.   MRN: [id number]  No chief complaint on file.   Referring provider: Debbrah Alar, NP  HPI: 42 year old female, former smoker. PMH significant for reactive airway dysfunction, GERD, lupus, Hashimoto's thyroiditis.  Patient of Dr. Erin Fulling, seen for initial consult 03/17/2022 for dyspnea on exertion.  Shortness of breath and wheezing likely related to reactive airways disease.  She was started on Advair HFA 115-21 mcg 2 puffs twice daily.  Ordered for PFTs.   05/25/2022- Interim hx  Patient presents today for 2 to 3-monthfollow-up with pulmonary function testing.  During her last office visit in November she was started on ICS/LABA and ordered for pulmonary function testing.      Allergies  Allergen Reactions   Effexor [Venlafaxine] Nausea Only    Rash    Rosuvastatin Nausea Only   Tape Itching and Other (See Comments)    Skin itches if tape is left on for awhile   Tramadol Nausea And Vomiting   Norco [Hydrocodone-Acetaminophen] Rash   Voltaren [Diclofenac] Rash    Immunization History  Administered Date(s) Administered   Influenza,inj,Quad PF,6+ Mos 01/26/2018, 01/18/2022   Influenza-Unspecified 03/25/2020, 01/17/2021   Moderna Sars-Covid-2 Vaccination 02/11/2020   PFIZER(Purple Top)SARS-COV-2 Vaccination 07/23/2019, 08/14/2019, 02/11/2020   Td 10/18/2011    Past Medical History:  Diagnosis Date   Adnexal mass 02/24/2021   in epic seen on [place]  Amenorrhea 08/14/2021   Anxiety 08/14/2021   Arthralgia 08/18/2016   Chondromalacia, patella 06/30/2021   Chronic constipation 08/14/2021   Chronic headaches HISTORY OF MIGRAINES 08/14/2021   Chronic migraine without aura, with intractable migraine, so stated, with status migrainosus [id number]  Complication of anesthesia 08/14/2021   PONV   Cyst of left ovary 06/17/2021   Degenerative disc disease, lumbar 09/04/2019   Depression with anxiety  03/26/2016   Diarrhea 07/08/2017   Effusion, left knee 12/16/2021   Endometriosis    Fatigue 03/26/2016   Gastroesophageal reflux disease 08/19/2020   Hair loss 11/22/2016   High risk medication use 11/22/2016   history of Chiari malformation    History of Chiari malformation 11/22/2016   history of COVID 02/2021   LOW GRADE FEVER CONGESTION AND HEADACHE ALL SYMPTOMS RESOLVED   Hot flashes 06/11/2021   Hyperlipidemia    Insomnia 08/19/2020   Low blood pressure    pt states history of low blood pressure-fainted 12/2010-instructed to move slowly   Nonepileptic episode ([place] 12/22/2016   Numbness and tingling 02/25/2016   OSTEOARTHRITIS 08/14/2021   Other epilepsy, intractable, without status epilepticus ([facility] 11/01/2016   Pain in right knee 11/13/2020   Photosensitivity 11/22/2016   Psychogenic nonepileptic seizure 07/27/2017   Raynaud's disease without gangrene 11/22/2016   Rectal bleeding 06/11/2021   Right lower quadrant abdominal pain 06/05/2021   Sacroiliac joint dysfunction of right side 12/19/2019   Sicca syndrome ([facility] 11/22/2016   SLE (systemic lupus erythematosus) ([place] 03/26/2016   Subclinical hypothyroidism    Thyroid nodule    Transient alteration of awareness 01/01/2016   Wears glasses for reading 08/14/2021    Tobacco History: Social History   Tobacco Use  Smoking Status Former   Packs/day: 1.00   Years: 10.00   Total pack years: 10.00   Types: Cigarettes   Quit date: 04/04/2004   Years since quitting: 18.1   Passive exposure: Past  Smokeless Tobacco Never   Counseling given: Not Answered   Outpatient Medications Prior to Visit  Medication  Sig Dispense Refill   APIXABAN (ELIQUIS) VTE STARTER PACK ('10MG'$  AND '5MG'$ ) Take as directed on package: start with two-'5mg'$  tablets twice daily for 7 days. On day 8, switch to one-'5mg'$  tablet twice daily. 74 each 0   fluticasone-salmeterol (ADVAIR HFA) 115-21 MCG/ACT inhaler Inhale 2 puffs into the lungs 2 (two) times  daily. 1 each 12   gabapentin (NEURONTIN) 300 MG capsule TAKE ONE CAPSULE BY MOUTH AT BEDTIME 90 capsule 1   levothyroxine (SYNTHROID) 50 MCG tablet Take 1 tablet (50 mcg total) by mouth daily. 90 tablet 1   liothyronine (CYTOMEL) 5 MCG tablet Take 10 mcg by mouth in the morning.     Melatonin 10 MG TABS Take 20 mg by mouth at bedtime.     methocarbamol (ROBAXIN) 500 MG tablet Take 1 tablet (500 mg total) by mouth 3 (three) times daily. 30 tablet 0   ondansetron (ZOFRAN) 4 MG tablet Take 1 tablet (4 mg total) by mouth every 8 (eight) hours as needed for nausea or vomiting. 20 tablet 0   PARoxetine (PAXIL) 20 MG tablet Take 1 tablet (20 mg total) by mouth at bedtime. 90 tablet 3   promethazine (PHENERGAN) 25 MG tablet Take 1 tablet (25 mg total) by mouth every 8 (eight) hours as needed for nausea or vomiting. 30 tablet 1   rosuvastatin (CRESTOR) 20 MG tablet Take 1 tablet (20 mg total) by mouth daily. (Patient not taking: Reported on 05/11/2022) 90 tablet 3   SUMAtriptan (IMITREX) 100 MG tablet Take 1 tablet (100 mg total) by mouth at onset of migraine. May repeat in 2 hours if headache persists or recurs. Do not take more than 2 doses in 24 hours. (Patient taking differently: Take 100 mg by mouth as needed (at onset of migraine- may repeat once in 2 hours, if no relief- max of 2 doses/24 hours).) 10 tablet 2   traZODone (DESYREL) 100 MG tablet TAKE TWO TABLETS BY MOUTH EVERY NIGHT AT BEDTIME (Patient taking differently: Take 200 mg by mouth at bedtime.) 180 tablet 1   No facility-administered medications prior to visit.      Review of Systems  Review of Systems   Physical Exam  There were no vitals taken for this visit. Physical Exam   Lab Results:  CBC    Component Value Date/Time   WBC 4.6 05/19/2022 0921   RBC 4.88 05/19/2022 0921   HGB 14.2 05/19/2022 0921   HGB 13.0 01/01/2016 0841   HCT 43.3 05/19/2022 0921   HCT 41.7 01/01/2016 0841   PLT 283.0 05/19/2022 0921   PLT 280  01/01/2016 0841   MCV 88.7 05/19/2022 0921   MCV 89 01/01/2016 0841   MCH 28.9 05/13/2022 0435   MCHC 32.8 05/19/2022 0921   RDW 14.0 05/19/2022 0921   RDW 13.9 01/01/2016 0841   LYMPHSABS 1.5 05/19/2022 0921   MONOABS 0.3 05/19/2022 0921   EOSABS 0.1 05/19/2022 0921   BASOSABS 0.0 05/19/2022 0921    BMET    Component Value Date/Time   NA 140 05/13/2022 0435   NA 143 01/01/2016 0841   K 3.9 05/13/2022 0435   CL 106 05/13/2022 0435   CO2 26 05/13/2022 0435   GLUCOSE 96 05/13/2022 0435   BUN 9 05/13/2022 0435   BUN 12 01/01/2016 0841   CREATININE 0.74 05/13/2022 0435   CREATININE 0.70 07/22/2017 1542   CALCIUM 8.9 05/13/2022 0435   GFRNONAA >60 05/13/2022 0435   GFRNONAA 97 07/05/2017 1700   GFRAA >60 07/14/2018  1623   GFRAA 113 07/05/2017 1700    BNP    Component Value Date/Time   BNP 15.6 05/11/2022 1140    ProBNP No results found for: "PROBNP"  Imaging: NM Hepato W/EF  Result Date: 05/24/2022 CLINICAL DATA:  Right upper quadrant abdominal pain x4 weeks EXAM: NUCLEAR MEDICINE HEPATOBILIARY IMAGING WITH GALLBLADDER EF TECHNIQUE: Sequential images of the abdomen were obtained out to 60 minutes following intravenous administration of radiopharmaceutical. After oral ingestion of Ensure, gallbladder ejection fraction was determined. At 60 min, normal ejection fraction is greater than 33%. RADIOPHARMACEUTICALS:  5.0 mCi Tc-62m Choletec IV COMPARISON:  CT May 11, 2022 FINDINGS: Prompt uptake and biliary excretion of activity by the liver is seen. Gallbladder activity is visualized, consistent with patency of cystic duct. Biliary activity passes into small bowel, consistent with patent common bile duct. Calculated gallbladder ejection fraction is 41%. (Normal gallbladder ejection fraction with Ensure is greater than 33%.) IMPRESSION: 1.  Patent cystic and common bile ducts. 2.  Normal gallbladder ejection fraction. Electronically Signed   By: JDahlia BailiffM.D.   On:  05/24/2022 13:23   VAS [place]OWER EXTREMITY VENOUS (DVT)  Result Date: 05/12/2022  Lower Venous DVT Study Patient Name:  RTALAYAH BABBIT Date of Exam:   05/12/2022 Medical Rec #: [id number]        Accession #:    [id number] of Birth: [birth year]       Patient Gender: F Patient Age:   89years [facility]      VAS [place] EXTREMITY VENOUS (DVT) Referring Phys: ANyoka LintDOUTOVA --------------------------------------------------------------------------------  Indications: Pulmonary embolism.  Comparison Study: Previous RLEV on 04/21/21 was negative for DVT Performing Technologist: JRogelia RohrerRVT, RDMS  Examination Guidelines: A complete evaluation includes B-mode imaging, spectral Doppler, color Doppler, and power Doppler as needed of all accessible portions of each vessel. Bilateral testing is considered an integral part of a complete examination. Limited examinations for reoccurring indications may be performed as noted. The reflux portion of the exam is performed with the patient in reverse Trendelenburg.  +---------+---------------+---------+-----------+----------+--------------+ RIGHT    CompressibilityPhasicitySpontaneityPropertiesThrombus Aging +---------+---------------+---------+-----------+----------+--------------+ CFV      Full           Yes      Yes                                 +---------+---------------+---------+-----------+----------+--------------+ SFJ      Full                                                        +---------+---------------+---------+-----------+----------+--------------+ FV Prox  Full           Yes      Yes                                 +---------+---------------+---------+-----------+----------+--------------+ FV Mid   Full           Yes      Yes                                 +---------+---------------+---------+-----------+----------+--------------+  FV DistalFull           Yes      Yes                                  +---------+---------------+---------+-----------+----------+--------------+ PFV      Full                                                        +---------+---------------+---------+-----------+----------+--------------+ POP      Full           Yes      Yes                                 +---------+---------------+---------+-----------+----------+--------------+ PTV      Full                                                        +---------+---------------+---------+-----------+----------+--------------+ PERO     Full                                                        +---------+---------------+---------+-----------+----------+--------------+   +---------+---------------+---------+-----------+----------+--------------+ LEFT     CompressibilityPhasicitySpontaneityPropertiesThrombus Aging +---------+---------------+---------+-----------+----------+--------------+ CFV      Full           Yes      Yes                                 +---------+---------------+---------+-----------+----------+--------------+ SFJ      Full                                                        +---------+---------------+---------+-----------+----------+--------------+ FV Prox  Full           Yes      Yes                                 +---------+---------------+---------+-----------+----------+--------------+ FV Mid   Full           Yes      Yes                                 +---------+---------------+---------+-----------+----------+--------------+ FV DistalFull           Yes      Yes                                 +---------+---------------+---------+-----------+----------+--------------+ PFV      Full                                                        +---------+---------------+---------+-----------+----------+--------------+  POP      Full           Yes      Yes                                  +---------+---------------+---------+-----------+----------+--------------+ PTV      Full                                                        +---------+---------------+---------+-----------+----------+--------------+ PERO     Full                                                        +---------+---------------+---------+-----------+----------+--------------+     Summary: BILATERAL: - No evidence of deep vein thrombosis seen in the lower extremities, bilaterally. -No evidence of popliteal cyst, bilaterally.   *See table(s) above for measurements and observations. Electronically signed by Harold Barban MD on 05/12/2022 at 8:58:06 PM.    Final    ECHOCARDIOGRAM COMPLETE  Result Date: 05/12/2022    ECHOCARDIOGRAM REPORT   Patient Name:   SHANEL CONTRERAS Date of Exam: 05/12/2022 Medical Rec #:  [id number]        Height:       69.0 in Accession #:    [id number]       Weight:       225.0 lb Date of Birth:  [birth year]       BSA:          2.172 m Patient Age:    46 years         BP:           136/82 mmHg Patient Gender: F                HR:           66 bpm. Exam Location:  Inpatient Procedure: 2D Echo, Color Doppler, Cardiac Doppler and Intracardiac            Opacification Agent Indications:    Pulmonary embolus  History:        Patient has prior history of Echocardiogram examinations, most                 recent 12/22/2021. Risk Factors:Dyslipidemia. SLE, hx of COVID.  Sonographer:    Eartha Inch Referring Phys: [id number] ANASTASSIA DOUTOVA  Sonographer Comments: Technically difficult study due to poor echo windows. Image acquisition challenging due to patient body habitus and Image acquisition challenging due to respiratory motion. IMPRESSIONS  1. Left ventricular ejection fraction, by estimation, is 60 to 65%. The left ventricle has normal function. The left ventricle has no regional wall motion abnormalities. Left ventricular diastolic parameters were normal.  2. Right ventricular systolic function  is normal. The right ventricular size is normal.  3. The mitral valve is normal in structure. No evidence of mitral valve regurgitation. No evidence of mitral stenosis.  4. The aortic valve is tricuspid. Aortic valve regurgitation is not visualized. No aortic stenosis is present.  5. The inferior vena cava is normal in size with greater than 50% respiratory variability, suggesting right  atrial pressure of 3 mmHg. FINDINGS  Left Ventricle: Left ventricular ejection fraction, by estimation, is 60 to 65%. The left ventricle has normal function. The left ventricle has no regional wall motion abnormalities. Definity contrast agent was given IV to delineate the left ventricular  endocardial borders. The left ventricular internal cavity size was normal in size. There is no left ventricular hypertrophy. Left ventricular diastolic parameters were normal. Right Ventricle: The right ventricular size is normal. No increase in right ventricular wall thickness. Right ventricular systolic function is normal. Left Atrium: Left atrial size was normal in size. Right Atrium: Right atrial size was normal in size. Pericardium: There is no evidence of pericardial effusion. Mitral Valve: The mitral valve is normal in structure. No evidence of mitral valve regurgitation. No evidence of mitral valve stenosis. Tricuspid Valve: The tricuspid valve is normal in structure. Tricuspid valve regurgitation is mild . No evidence of tricuspid stenosis. Aortic Valve: The aortic valve is tricuspid. Aortic valve regurgitation is not visualized. No aortic stenosis is present. Pulmonic Valve: The pulmonic valve was normal in structure. Pulmonic valve regurgitation is not visualized. No evidence of pulmonic stenosis. Aorta: The aortic root is normal in size and structure. Venous: The inferior vena cava is normal in size with greater than 50% respiratory variability, suggesting right atrial pressure of 3 mmHg. IAS/Shunts: No atrial level shunt detected by  color flow Doppler.  LEFT VENTRICLE PLAX 2D LVIDd:         5.30 cm     Diastology LVIDs:         3.70 cm     LV e' medial:    9.14 cm/s LV PW:         0.70 cm     LV E/e' medial:  9.6 LV IVS:        0.50 cm     LV e' lateral:   12.50 cm/s LVOT diam:     2.50 cm     LV E/e' lateral: 7.0 LV SV:         123 LV SV Index:   56 LVOT Area:     4.91 cm  LV Volumes (MOD) LV vol d, MOD A2C: 66.5 ml LV vol d, MOD A4C: 96.7 ml LV vol s, MOD A2C: 22.7 ml LV vol s, MOD A4C: 40.0 ml LV SV MOD A2C:     43.8 ml LV SV MOD A4C:     96.7 ml LV SV MOD BP:      48.9 ml RIGHT VENTRICLE             IVC RV S prime:     11.30 cm/s  IVC diam: 2.00 cm TAPSE (M-mode): 2.0 cm LEFT ATRIUM             Index        RIGHT ATRIUM           Index LA diam:        3.60 cm 1.66 cm/m   RA Area:     18.50 cm LA Vol (A2C):   76.8 ml 35.36 ml/m  RA Volume:   50.50 ml  23.25 ml/m LA Vol (A4C):   68.2 ml 31.40 ml/m LA Biplane Vol: 73.7 ml 33.93 ml/m  AORTIC VALVE LVOT Vmax:   99.90 cm/s LVOT Vmean:  74.800 cm/s LVOT VTI:    0.250 m  AORTA Ao Root diam: 3.50 cm Ao Asc diam:  3.30 cm MITRAL VALVE  TRICUSPID VALVE MV Area (PHT): 3.72 cm    TR Peak grad:   14.4 mmHg MV Decel Time: 204 msec    TR Vmax:        190.00 cm/s MV E velocity: 87.40 cm/s MV A velocity: 63.80 cm/s  SHUNTS MV E/A ratio:  1.37        Systemic VTI:  0.25 m                            Systemic Diam: 2.50 cm Jenkins Rouge MD Electronically signed by Jenkins Rouge MD Signature Date/Time: 05/12/2022/2:35:25 PM    Final    CT Angio Chest PE W and/or Wo Contrast  Result Date: 05/11/2022 CLINICAL DATA:  High probability of pulmonary embolus. Persistent RIGHT flank pain and emesis. EXAM: CT ANGIOGRAPHY CHEST WITH CONTRAST TECHNIQUE: Multidetector CT imaging of the chest was performed using the standard protocol during bolus administration of intravenous contrast. Multiplanar CT image reconstructions and MIPs were obtained to evaluate the vascular anatomy. RADIATION DOSE REDUCTION:  This exam was performed according to the departmental dose-optimization program which includes automated exposure control, adjustment of the mA and/or kV according to patient size and/or use of iterative reconstruction technique. CONTRAST:  33m OMNIPAQUE IOHEXOL 350 MG/ML SOLN COMPARISON:  04/23/2021 FINDINGS: Cardiovascular: Pulmonary arteries are well opacified by contrast bolus. There are small pulmonary emboli within the RIGHT LOWER lobe posterior basal segmental branch of the pulmonary artery. (Image 149 and 170 of series 6). The heart size is normal no pericardial effusion. Thoracic aorta is unremarkable. Mediastinum/Nodes: The visualized portion of the thyroid gland has a normal appearance. No significant mediastinal, hilar, or axillary adenopathy. Esophagus is unremarkable. Lungs/Pleura: Lungs are clear. No pleural effusion or pneumothorax. Upper Abdomen: No acute abnormality. Musculoskeletal: No chest wall abnormality. No acute or significant osseous findings. Review of the MIP images confirms the above findings. IMPRESSION: 1. Small pulmonary emboli within the RIGHT LOWER lobe posterior basal segmental branch of the pulmonary artery. 2. No evidence for RIGHT heart strain. Critical Value/emergent results were called by telephone at the time of interpretation on 05/11/2022 at 3:14 pm to [facility], who verbally acknowledged these results. Electronically Signed   By: ENolon NationsM.D.   On: 05/11/2022 15:14   CT ABDOMEN PELVIS W CONTRAST  Result Date: 05/11/2022 CLINICAL DATA:  Abdominal/flank pain, stone suspected. Right upper quadrant pain and vomiting since yesterday. EXAM: CT ABDOMEN AND PELVIS WITH CONTRAST TECHNIQUE: Multidetector CT imaging of the abdomen and pelvis was performed using the standard protocol following bolus administration of intravenous contrast. RADIATION DOSE REDUCTION: This exam was performed according to the departmental dose-optimization program which  includes automated exposure control, adjustment of the mA and/or kV according to patient size and/or use of iterative reconstruction technique. CONTRAST:  1044mOMNIPAQUE IOHEXOL 300 MG/ML  SOLN COMPARISON:  CT abdomen/pelvis 06/05/2021. Right upper quadrant ultrasound 05/10/2022. FINDINGS: Lower chest: Clear lung bases.  No pleural or pericardial effusion. Hepatobiliary: No focal liver abnormality is seen. No radiopaque gallstones, pericholecystic fat stranding, or biliary dilatation. Pancreas: Unremarkable. No pancreatic ductal dilatation or surrounding inflammatory changes. Spleen: Normal. Adrenals/Urinary Tract: Adrenal glands are unremarkable. Kidneys are normal, without renal calculi, focal lesion, or hydronephrosis. No hydroureter. Numerous phleboliths in the pelvis. Bladder is unremarkable. Stomach/Bowel: Normal stomach and duodenum. No dilated small bowel loops. Appendix is visualized on image [id number] 2 with newly increased density in the base and tip, possibly representing developing appendicolith  or retained hyperdense material. Normal size. No surrounding inflammation. Mild diverticulosis of the transverse colon without evidence of acute diverticulitis. Sigmoid colon and rectum are unremarkable. Vascular/Lymphatic: No significant vascular findings are present. No enlarged abdominal or pelvic lymph nodes. Reproductive: Status post hysterectomy. No adnexal masses. Other: No abdominal wall hernia or abnormality. No abdominopelvic ascites. Musculoskeletal: Transitional lumbosacral anatomy with sacralization of L5. No suspicious bone lesions. IMPRESSION: 1. No acute abdominal/pelvic pathology. No renal/ureteral calculi or obstructive uropathy. 2. Increased density in the appendiceal base and tip, possibly developing appendicolith or retained hyperdense material. No evidence of acute appendicitis. 3. Mild colonic diverticulosis without evidence of acute diverticulitis. 4. Transitional lumbosacral anatomy  with sacralization of L5. Electronically Signed   By: Emmit Alexanders M.D.   On: 05/11/2022 13:35   US Abdomen Limited RUQ (LIVER/GB)  Result Date: 05/10/2022 CLINICAL DATA:  Right upper quadrant pain EXAM: ULTRASOUND ABDOMEN LIMITED RIGHT UPPER QUADRANT COMPARISON:  CT 06/05/2021 FINDINGS: Gallbladder: Contracted gallbladder. Small polyps measuring up to 4 mm. Increased wall thickness at 3.9 mm. Negative sonographic Murphy. Common bile duct: Diameter: 2.5 mm Liver: No focal lesion identified. Within normal limits in parenchymal echogenicity. Portal vein is patent on color Doppler imaging with normal direction of blood flow towards the liver. Other: None. IMPRESSION: 1. Contracted gallbladder with small polyps. Increased wall thickness at 3.9 mm likely related to under distension. Negative sonographic Murphy. 2. Otherwise negative right upper quadrant abdominal ultrasound. Electronically Signed   By: Donavan Foil M.D.   On: 05/10/2022 23:51   MR BREAST BILATERAL W WO CONTRAST INC CAD  Result Date: 05/10/2022 CLINICAL DATA:  42 year old female with new clear, spontaneous left nipple discharge. Recent mammographic and sonographic evaluation showed no associated findings. EXAM: BILATERAL BREAST MRI WITH AND WITHOUT CONTRAST TECHNIQUE: Multiplanar, multisequence MR images of both breasts were obtained prior to and following the intravenous administration of 10 ml of Vueway. Three-dimensional MR images were rendered by post-processing of the original MR data on an independent workstation. The three-dimensional MR images were interpreted, and findings are reported in the following complete MRI report for this study. Three dimensional images were evaluated at the independent interpreting workstation using the DynaCAD thin client. COMPARISON:  Previous exam(s). FINDINGS: Breast composition: b. Scattered fibroglandular tissue. Background parenchymal enhancement: Mild. Right breast: No suspicious mass or abnormal  enhancement. Left breast: No suspicious mass or abnormal enhancement. Specifically, there are no enhancing masses, ducts are duct ectasia within the subareolar left breast. Additionally, there are no enhancing findings within the lateral left breast. Lymph nodes: No abnormal appearing lymph nodes. Ancillary findings:  None. IMPRESSION: Unremarkable MRI evaluation of the bilateral breasts. RECOMMENDATION: Surgical referral for left nipple discharge and bilateral diagnostic mammogram in June 2024 per diagnostic report dated 04/14/2022. BI-RADS CATEGORY  1: Negative. Electronically Signed   By: Kristopher Oppenheim M.D.   On: 05/10/2022 08:49  US THYROID  Result Date: 05/04/2022 CLINICAL DATA:  Hoarseness and choking with food. History Hashimoto's thyroiditis and hypothyroidism. EXAM: THYROID ULTRASOUND TECHNIQUE: Ultrasound examination of the thyroid gland and adjacent soft tissues was performed. COMPARISON:  None Available. FINDINGS: Parenchymal Echotexture: Moderately heterogenous Isthmus: 0.4 cm Right lobe: 5.6 x 1.8 x 1.3 cm Left lobe: 4.7 x 1.8 x 1.3 cm _________________________________________________________ Estimated total number of nodules >/= 1 cm: 1 Number of spongiform nodules >/=  2 cm not described below (TR1): 0 Number of mixed cystic and solid nodules >/= 1.5 cm not described below ([place]): 0 _________________________________________________________ Nodule # 1: Location: Right;  Mid Maximum size: 0.9 cm; Other 2 dimensions: 0.8 x 0.9 cm Composition: solid/almost completely solid (2) Echogenicity: hypoechoic (2) Shape: not taller-than-wide (0) Margins: ill-defined (0) Echogenic foci: none (0) ACR TI-RADS total points: 4. ACR TI-RADS risk category: TR4 (4-6 points). ACR TI-RADS recommendations: Given size (</=0.9 cm) and appearance, this nodule does NOT meet TI-RADS criteria for biopsy or dedicated follow-up. _________________________________________________________ Nodule # 2: Location: Right; Inferior  Maximum size: 2.4 cm; Other 2 dimensions: 1.4 x 2.1 cm Composition: solid/almost completely solid (2) Echogenicity: hyperechoic (1) Shape: not taller-than-wide (0) Margins: ill-defined (0) Echogenic foci: none (0) ACR TI-RADS total points: 3. ACR TI-RADS risk category: TR3 (3 points). ACR TI-RADS recommendations: *Given size (>/= 1.5 - 2.4 cm) and appearance, a follow-up ultrasound in 1 year should be considered based on TI-RADS criteria. Appearance is more likely representative of a cluster of small adjacent nodules rather than 1 discrete nodule but an overall dimension will be given for follow-up purposes. _________________________________________________________ No enlarged or abnormal appearing lymph nodes are identified. IMPRESSION: Mildly enlarged and moderately heterogeneous thyroid gland. A 0.9 cm right mid thyroid nodule does not meet criteria for biopsy or follow-up. A conglomerate area likely representing a cluster of nodules measuring 2.4 cm meets criteria for follow-up ultrasound in 1 year. The above is in keeping with the ACR TI-RADS recommendations - J Am Coll Radiol 2017;14:587-595. Electronically Signed   By: Aletta Edouard M.D.   On: 05/04/2022 15:54     Assessment & Plan:   No problem-specific Assessment & Plan notes found for this encounter.     Martyn Ehrich, NP 05/25/2022

## 2022-05-25 NOTE — Telephone Encounter (Signed)
Patient will be here tomorrow for her clearance all information requested will be faxed then

## 2022-05-26 ENCOUNTER — Ambulatory Visit (INDEPENDENT_AMBULATORY_CARE_PROVIDER_SITE_OTHER): Payer: BC Managed Care – PPO | Admitting: Family

## 2022-05-26 ENCOUNTER — Ambulatory Visit (HOSPITAL_BASED_OUTPATIENT_CLINIC_OR_DEPARTMENT_OTHER)
Admission: RE | Admit: 2022-05-26 | Discharge: 2022-05-26 | Disposition: A | Discharge status: Home / Self Care | Payer: BC Managed Care – PPO | Source: Ambulatory Visit | Attending: Family | Admitting: Family

## 2022-05-26 VITALS — BP 104/55 | HR 77 | Temp 98.1°F | Resp 16 | Wt 228.0 lb

## 2022-05-26 DIAGNOSIS — I2699 Other pulmonary embolism without acute cor pulmonale: Secondary | ICD-10-CM

## 2022-05-26 DIAGNOSIS — K819 Cholecystitis, unspecified: Secondary | ICD-10-CM

## 2022-05-26 DIAGNOSIS — D6852 Prothrombin gene mutation: Secondary | ICD-10-CM

## 2022-05-26 DIAGNOSIS — Z01818 Encounter for other preprocedural examination: Secondary | ICD-10-CM | POA: Diagnosis not present

## 2022-05-26 HISTORY — DX: Cholecystitis, unspecified: K81.9

## 2022-05-26 NOTE — Patient Instructions (Addendum)
Stop Eliquis 2 days before planned surgery.  For example- if surgery is scheduled for Friday- your last dose will be on Wednesday AM.  You would start lovenox on Thursday AM and dose '100mg'$  SQ that morning. You will need to restart Eliquis the day following surgery.

## 2022-05-26 NOTE — Assessment & Plan Note (Signed)
Patient continues to have RUQ pain.   Completed HIDA scan on 05/24/22 which noted normal GB fraction.  She had recent mild elevation of ALT. Gallbladder noted to be contracted with small polyps and increased wall thickness at 3.9 mm on [place].  GB appeared unremarkable on CT.  I would like to reach out to hematology for perioperative management of anticoagulation.

## 2022-05-26 NOTE — Assessment & Plan Note (Addendum)
She is currently maintained on Eliquis '5mg'$  BID.  Will request input from hematology about perioperative anticoagulation as well as duration of therapy need. Hypercoaguable panel is pending. EKG tracing is personally reviewed.  EKG notes NSR.  No acute changes.  Pre-operative CXR will also be obtained.

## 2022-05-26 NOTE — Progress Notes (Addendum)
Subjective:   By signing my name below, I, Shehryar Baig, attest that this documentation has been prepared under the direction and in the presence of Debbrah Alar, NP. 05/26/2022   Patient ID: Kim Pham, female    DOB: Mar 02, 1981, 42 y.o.   MRN: [id number]  Chief Complaint  Patient presents with   Medical Clearance    Here for exam prior to surgery    HPI Patient is in today for a surgical clearance visit.   Cholecystectomy: Her pain has not changed. She continues having shortness of breath but found it has improved overall. She denies having any chest pain. She has an upcomming cholecystectomy procedure. She continues taking eliquis for her pulmonary embolism and is willing to stop taking it prior to her procedure.  Lab Results  Component Value Date   ALT 56 (H) 05/31/2022   AST 49 (H) 05/31/2022   ALKPHOS 77 05/31/2022   BILITOT 0.4 05/31/2022    Past Medical History:  Diagnosis Date   Adnexal mass 02/24/2021   in epic seen on [place]   Amenorrhea 08/14/2021   Anxiety 08/14/2021   Arthralgia 08/18/2016   Chondromalacia, patella 06/30/2021   Chronic constipation 08/14/2021   Chronic headaches HISTORY OF MIGRAINES 08/14/2021   Chronic migraine without aura, with intractable migraine, so stated, with status migrainosus [id number]   Complication of anesthesia 08/14/2021   PONV   Cyst of left ovary 06/17/2021   Degenerative disc disease, lumbar 09/04/2019   Depression with anxiety 03/26/2016   Diarrhea 07/08/2017   Effusion, left knee 12/16/2021   Endometriosis    Fatigue 03/26/2016   Gastroesophageal reflux disease 08/19/2020   Hair loss 11/22/2016   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 ([place])    High risk medication use 11/22/2016   history of Chiari malformation    History of Chiari malformation 11/22/2016   history of COVID 02/2021   LOW GRADE FEVER CONGESTION AND HEADACHE ALL SYMPTOMS RESOLVED   Hot flashes 06/11/2021   Hyperlipidemia     Insomnia 08/19/2020   Low blood pressure    pt states history of low blood pressure-fainted 12/2010-instructed to move slowly   Nonepileptic episode ([place]) 12/22/2016   Numbness and tingling 02/25/2016   OSTEOARTHRITIS 08/14/2021   Other epilepsy, intractable, without status epilepticus ([place]) 11/01/2016   Pain in right knee 11/13/2020   Photosensitivity 11/22/2016   Psychogenic nonepileptic seizure 07/27/2017   Raynaud's disease without gangrene 11/22/2016   Rectal bleeding 06/11/2021   Right lower quadrant abdominal pain 06/05/2021   Sacroiliac joint dysfunction of right side 12/19/2019   Sicca syndrome ([place]) 11/22/2016   SLE (systemic lupus erythematosus) ([place]) 03/26/2016   Subclinical hypothyroidism    Thyroid nodule    Transient alteration of awareness 01/01/2016   Wears glasses for reading 08/14/2021    Past Surgical History:  Procedure Laterality Date   ABDOMINAL HYSTERECTOMY  2007   CYSTOSCOPY  2007   WITH HYSTERECTOMY   DIAGNOSTIC LAPAROSCOPY  2005   OOPHORECTOMY Left 08/18/2021   ROTATOR CUFF REPAIR Right 2010   SALPINGOOPHORECTOMY  04/16/2011   Procedure: SALPINGO OOPHERECTOMY;  Surgeon: Lovenia Kim, MD;  Location: [facility];  Service: Gynecology;  Laterality: Right;   skull decompression surgery 2005     @ [facility]    Family History  Problem Relation Age of Onset   Hypertension Mother    CAD Mother    Breast cancer Mother    Migraines Mother    Gout Mother    Heart disease  Mother    Crohn's disease Mother    Hypertension Father    CAD Father    CVA Father    Heart disease Father    Colon polyps Father    Liver disease Father    Sjogren's syndrome Sister    Celiac disease Sister    Rheum arthritis Maternal Grandmother    Dementia Maternal Grandmother    Pancreatic cancer Maternal Grandfather    CAD Paternal Grandmother    Macular degeneration Paternal Grandmother    CVA Paternal Grandfather    Autism Son    ADD / ADHD Son    Breast cancer  Maternal Aunt    Esophageal cancer Neg Hx    Colon cancer Neg Hx    Stomach cancer Neg Hx     Social History   Socioeconomic History   Marital status: Married    Spouse name: Not on file   Number of children: 1   Years of education: Assoc   Highest education level: Not on file  Occupational History   Occupation: nurse  Tobacco Use   Smoking status: Former    Packs/day: 1.00    Years: 10.00    Total pack years: 10.00    Types: Cigarettes    Quit date: 04/04/2004    Years since quitting: 18.1    Passive exposure: Past   Smokeless tobacco: Never  Vaping Use   Vaping Use: Never used  Substance and Sexual Activity   Alcohol use: Not Currently   Drug use: Never   Sexual activity: Yes    Birth control/protection: Surgical  Other Topics Concern   Not on file  Social History Narrative   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   Married   1 son- born 2004 has autism   Enjoys exercise, special olympics with son   Drinks 4-5 cups of coffee a day       Right handed   Social Determinants of Health   Financial Resource Strain: Not on file  Food Insecurity: No Food Insecurity (05/11/2022)   Hunger Vital Sign    Worried About Running Out of Food in the Last Year: Never true    Ran Out of Food in the Last Year: Never true  Transportation Needs: No Transportation Needs (05/11/2022)   PRAPARE - Hydrologist (Medical): No    Lack of Transportation (Non-Medical): No  Physical Activity: Not on file  Stress: Not on file  Social Connections: Not on file  Intimate Partner Violence: Not At Risk (05/11/2022)   Humiliation, Afraid, Rape, and Kick questionnaire    Fear of Current or Ex-Partner: No    Emotionally Abused: No    Physically Abused: No    Sexually Abused: No    Outpatient Medications Prior to Visit  Medication Sig Dispense Refill   APIXABAN (ELIQUIS) VTE STARTER PACK (10MG AND 5MG) Take as directed on package: start with two-46m tablets twice daily for 7  days. On day 8, switch to one-515mtablet twice daily. 74 each 0   fluticasone-salmeterol (ADVAIR HFA) 115-21 MCG/ACT inhaler Inhale 2 puffs into the lungs 2 (two) times daily. 1 each 12   gabapentin (NEURONTIN) 300 MG capsule TAKE ONE CAPSULE BY MOUTH AT BEDTIME 90 capsule 1   levothyroxine (SYNTHROID) 50 MCG tablet Take 1 tablet (50 mcg total) by mouth daily. 90 tablet 1   liothyronine (CYTOMEL) 5 MCG tablet Take 10 mcg by mouth in the morning.     Melatonin 10 MG  TABS Take 20 mg by mouth at bedtime.     methocarbamol (ROBAXIN) 500 MG tablet Take 1 tablet (500 mg total) by mouth 3 (three) times daily. 30 tablet 0   ondansetron (ZOFRAN) 4 MG tablet Take 1 tablet (4 mg total) by mouth every 8 (eight) hours as needed for nausea or vomiting. 20 tablet 0   PARoxetine (PAXIL) 20 MG tablet Take 1 tablet (20 mg total) by mouth at bedtime. 90 tablet 3   promethazine (PHENERGAN) 25 MG tablet Take 1 tablet (25 mg total) by mouth every 8 (eight) hours as needed for nausea or vomiting. 30 tablet 1   rosuvastatin (CRESTOR) 20 MG tablet Take 1 tablet (20 mg total) by mouth daily. 90 tablet 3   SUMAtriptan (IMITREX) 100 MG tablet Take 1 tablet (100 mg total) by mouth at onset of migraine. May repeat in 2 hours if headache persists or recurs. Do not take more than 2 doses in 24 hours. (Patient taking differently: Take 100 mg by mouth as needed (at onset of migraine- may repeat once in 2 hours, if no relief- max of 2 doses/24 hours).) 10 tablet 2   traZODone (DESYREL) 100 MG tablet TAKE TWO TABLETS BY MOUTH EVERY NIGHT AT BEDTIME (Patient taking differently: Take 200 mg by mouth at bedtime.) 180 tablet 1   No facility-administered medications prior to visit.    Allergies  Allergen Reactions   Effexor [Venlafaxine] Nausea Only    Rash    Rosuvastatin Nausea Only   Tape Itching and Other (See Comments)    Skin itches if tape is left on for awhile   Tramadol Nausea And Vomiting   Norco  [Hydrocodone-Acetaminophen] Rash   Voltaren [Diclofenac] Rash    ROS See HPI    Objective:    Physical Exam Constitutional:      General: She is not in acute distress.    Appearance: Normal appearance. She is not ill-appearing.  HENT:     Head: Normocephalic and atraumatic.     Right Ear: External ear normal.     Left Ear: External ear normal.  Eyes:     Extraocular Movements: Extraocular movements intact.     Pupils: Pupils are equal, round, and reactive to light.  Cardiovascular:     Rate and Rhythm: Normal rate and regular rhythm.     Heart sounds: Normal heart sounds. No murmur heard.    No gallop.  Pulmonary:     Effort: Pulmonary effort is normal. No respiratory distress.     Breath sounds: Normal breath sounds. No wheezing or rales.  Skin:    General: Skin is warm and dry.  Neurological:     Mental Status: She is alert and oriented to person, place, and time.  Psychiatric:        Judgment: Judgment normal.     BP (!) 104/55 (BP Location: Right Arm, Patient Position: Sitting, Cuff Size: Large)   Pulse 77   Temp 98.1 F (36.7 C) (Oral)   Resp 16   Wt 228 lb (103.4 kg)   SpO2 98%   BMI 33.67 kg/m  Wt Readings from Last 3 Encounters:  05/31/22 234 lb (106.1 kg)  05/26/22 228 lb (103.4 kg)  05/19/22 227 lb 9.6 oz (103.2 kg)       Assessment & Plan:  Acute pulmonary embolism without acute cor pulmonale, unspecified pulmonary embolism type ([place]) Assessment & Plan: She is currently maintained on Eliquis 18m BID.  Will request input from hematology about perioperative  anticoagulation as well as duration of therapy need. Hypercoaguable panel is pending. EKG tracing is personally reviewed.  EKG notes NSR.  No acute changes.  Pre-operative CXR will also be obtained.   Orders: -     DG Chest 2 View; Future -     EKG 12-Lead -     Ambulatory referral to Hematology / Oncology  Cholecystitis Assessment & Plan: Patient continues to have RUQ pain.   Completed  HIDA scan on 05/24/22 which noted normal GB fraction.  She had recent mild elevation of ALT. Gallbladder noted to be contracted with small polyps and increased wall thickness at 3.9 mm on [place].  GB appeared unremarkable on CT.  I would like to reach out to hematology for perioperative management of anticoagulation.    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  She saw hematology and will likely need lifelong anti-coagulation.   Addendum:  Pt was sent to hematology for pre-op evaluation and peri-op anticoagulation recommendations.  Pt is cleared from a medical standpoint to proceed with planned surgery with the following hematology recommendations:  Stop Eliquis and start lovenox 5 days prior to surgery. Stop Eliquis one day before surgery. Restart Eliquis post op day 1 at 27m bid.    I, MNance Pear NP, personally preformed the services described in this documentation.  All medical record entries made by the scribe were at my direction and in my presence.  I have reviewed the chart and discharge instructions (if applicable) and agree that the record reflects my personal performance and is accurate and complete. 05/26/2022   I,Shehryar Baig,acting as a scribe for MNance Pear NP.,have documented all relevant documentation on the behalf of MNance Pear NP,as directed by  MNance Pear NP while in the presence of MNance Pear NP.   MNance Pear NP

## 2022-05-27 ENCOUNTER — Encounter: Payer: Self-pay | Admitting: Family

## 2022-05-27 ENCOUNTER — Telehealth: Payer: Self-pay | Admitting: Family

## 2022-05-27 ENCOUNTER — Other Ambulatory Visit: Payer: Self-pay | Admitting: Family

## 2022-05-27 DIAGNOSIS — D6852 Prothrombin gene mutation: Secondary | ICD-10-CM | POA: Insufficient documentation

## 2022-05-27 LAB — HYPERCOAGULABLE PANEL, COMPREHENSIVE
APTT: 27.4 s
AT III Act/Nor PPP Chro: 144 % — ABNORMAL HIGH
Act. Prt C Resist w/FV Defic.: 2.5 ratio
DRVVT Confirm Seconds: 45.6 s
DRVVT Ratio: 1.2 ratio
DRVVT Screen Seconds: 59.1 s — ABNORMAL HIGH
Factor VII Antigen**: 155 %
Factor VIII Activity: 151 %
Hexagonal Phospholipid Neutral: 5 s
Prot C Ag Act/Nor PPP Imm: 122 %
Prot S Ag Act/Nor PPP Imm: 127 %
Protein C Ag/FVII Ag Ratio**: 0.8 ratio
Protein S Ag/FVII Ag Ratio**: 0.8 ratio

## 2022-05-27 MED ORDER — OXYCODONE-ACETAMINOPHEN 5-325 MG PO TABS: 1.0000 | ORAL_TABLET | Freq: Four times a day (QID) | ORAL | 0 refills | Status: AC | PRN

## 2022-05-27 NOTE — Telephone Encounter (Signed)
See mychart.  

## 2022-05-31 ENCOUNTER — Inpatient Hospital Stay: Payer: BC Managed Care – PPO | Attending: Hematology & Oncology

## 2022-05-31 ENCOUNTER — Inpatient Hospital Stay (HOSPITAL_BASED_OUTPATIENT_CLINIC_OR_DEPARTMENT_OTHER): Payer: BC Managed Care – PPO | Admitting: Hematology & Oncology

## 2022-05-31 ENCOUNTER — Other Ambulatory Visit: Payer: Self-pay

## 2022-05-31 ENCOUNTER — Encounter: Payer: Self-pay | Admitting: Hematology & Oncology

## 2022-05-31 VITALS — BP 111/70 | HR 73 | Temp 98.1°F | Resp 18 | Ht 69.0 in | Wt 234.0 lb

## 2022-05-31 DIAGNOSIS — Z87891 Personal history of nicotine dependence: Secondary | ICD-10-CM | POA: Diagnosis not present

## 2022-05-31 DIAGNOSIS — Z8616 Personal history of COVID-19: Secondary | ICD-10-CM | POA: Insufficient documentation

## 2022-05-31 DIAGNOSIS — I2699 Other pulmonary embolism without acute cor pulmonale: Secondary | ICD-10-CM | POA: Diagnosis not present

## 2022-05-31 DIAGNOSIS — D6852 Prothrombin gene mutation: Secondary | ICD-10-CM

## 2022-05-31 DIAGNOSIS — K829 Disease of gallbladder, unspecified: Secondary | ICD-10-CM

## 2022-05-31 DIAGNOSIS — Z7901 Long term (current) use of anticoagulants: Secondary | ICD-10-CM

## 2022-05-31 DIAGNOSIS — Z803 Family history of malignant neoplasm of breast: Secondary | ICD-10-CM | POA: Insufficient documentation

## 2022-05-31 DIAGNOSIS — I2609 Other pulmonary embolism with acute cor pulmonale: Secondary | ICD-10-CM

## 2022-05-31 LAB — CBC WITH DIFFERENTIAL (CANCER CENTER ONLY)
Abs Immature Granulocytes: 0.03 10*3/uL (ref 0.00–0.07)
Basophils Absolute: 0 10*3/uL (ref 0.0–0.1)
Basophils Relative: 1 %
Eosinophils Absolute: 0.1 10*3/uL (ref 0.0–0.5)
Eosinophils Relative: 3 %
HCT: 38.3 % (ref 36.0–46.0)
Hemoglobin: 12.4 g/dL (ref 12.0–15.0)
Immature Granulocytes: 1 %
Lymphocytes Relative: 37 %
Lymphs Abs: 1.5 10*3/uL (ref 0.7–4.0)
MCH: 28.8 pg (ref 26.0–34.0)
MCHC: 32.4 g/dL (ref 30.0–36.0)
MCV: 89.1 fL (ref 80.0–100.0)
Monocytes Absolute: 0.2 10*3/uL (ref 0.1–1.0)
Monocytes Relative: 6 %
Neutro Abs: 2.1 10*3/uL (ref 1.7–7.7)
Neutrophils Relative %: 52 %
Platelet Count: 241 10*3/uL (ref 150–400)
RBC: 4.3 MIL/uL (ref 3.87–5.11)
RDW: 13.2 % (ref 11.5–15.5)
WBC Count: 4 10*3/uL (ref 4.0–10.5)
nRBC: 0 % (ref 0.0–0.2)

## 2022-05-31 LAB — CMP (CANCER CENTER ONLY)
ALT: 56 U/L — ABNORMAL HIGH (ref 0–44)
AST: 49 U/L — ABNORMAL HIGH (ref 15–41)
Albumin: 3.8 g/dL (ref 3.5–5.0)
Alkaline Phosphatase: 77 U/L (ref 38–126)
Anion gap: 6 (ref 5–15)
BUN: 15 mg/dL (ref 6–20)
CO2: 25 mmol/L (ref 22–32)
Calcium: 9 mg/dL (ref 8.9–10.3)
Chloride: 104 mmol/L (ref 98–111)
Creatinine: 0.87 mg/dL (ref 0.44–1.00)
GFR, Estimated: >60 mL/min (ref >60)
Glucose, Bld: 106 mg/dL — ABNORMAL HIGH (ref 70–99)
Potassium: 4 mmol/L (ref 3.5–5.1)
Sodium: 135 mmol/L (ref 135–145)
Total Bilirubin: 0.4 mg/dL (ref 0.3–1.2)
Total Protein: 6.6 g/dL (ref 6.5–8.1)

## 2022-05-31 LAB — D-DIMER, QUANTITATIVE: D-Dimer, Quant: <0.27 ug/mL-FEU (ref 0.00–0.50)

## 2022-05-31 MED ORDER — ENOXAPARIN SODIUM 150 MG/ML IJ SOSY: 150.0000 mg | PREFILLED_SYRINGE | INTRAMUSCULAR | 0 refills | Status: DC

## 2022-05-31 NOTE — Progress Notes (Signed)
Referral MD  Reason for Referral: Pulmonary embolism-right lower lobe  - (+) Prothrombin 2 gene mutation  Chief Complaint  Patient presents with   New Patient (Initial Visit)  : I have a blood clot in my lung and I need gallbladder surgery  HPI: Kim Pham is a very nice 42 year old white female.  She is a Merchandiser, retail for [employer].  She has been there for 5 years.  She has multiple medical problems.  Her main problem right now has been a bad gallbladder.  She is having a lot of pain.  She really cannot eat.  She really cannot work.  She would need to have the gallbladder taken out.  About 2 or 3 weeks ago, she is having some chest pain and some shortness of breath.  She has had problems in the past with similar symptoms.  She has had D-dimers done.  She has had multiple CT angiograms of the chest done.  This time, she was found to have a pulmonary embolism in the right lower lobe of her lung.  She has been on Eliquis.  She had Dopplers of her legs done.  These were negative.    She had a very extensive hypercoagulable workup done.  Surprisingly, she was found to be heterozygous for the Prothrombin 2 gene mutation.  As far she knows, there is no one in the family with history of blood clots.  She has had no issues with bleeding.  She has had no change in bowel or bladder habits.  She is not diabetic.  She does not smoke.  She has not been on any long trips.  She is not sedentary at all.  She says that the surgeon wants to operate on her in a week or so.  She has had no weight loss or weight gain.  She has had no problems with COVID.  Currently, I would have to say that her performance status is probably ECOG 1.   Past Medical History:  Diagnosis Date   Adnexal mass 02/24/2021   in epic seen on [place]   Amenorrhea 08/14/2021   Anxiety 08/14/2021   Arthralgia 08/18/2016   Chondromalacia, patella 06/30/2021   Chronic constipation 08/14/2021   Chronic headaches  HISTORY OF MIGRAINES 08/14/2021   Chronic migraine without aura, with intractable migraine, so stated, with status migrainosus [id number]   Complication of anesthesia 08/14/2021   PONV   Cyst of left ovary 06/17/2021   Degenerative disc disease, lumbar 09/04/2019   Depression with anxiety 03/26/2016   Diarrhea 07/08/2017   Effusion, left knee 12/16/2021   Endometriosis    Fatigue 03/26/2016   Gastroesophageal reflux disease 08/19/2020   Hair loss 11/22/2016   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 ([place])    High risk medication use 11/22/2016   history of Chiari malformation    History of Chiari malformation 11/22/2016   history of COVID 02/2021   LOW GRADE FEVER CONGESTION AND HEADACHE ALL SYMPTOMS RESOLVED   Hot flashes 06/11/2021   Hyperlipidemia    Insomnia 08/19/2020   Low blood pressure    pt states history of low blood pressure-fainted 12/2010-instructed to move slowly   Nonepileptic episode ([place]) 12/22/2016   Numbness and tingling 02/25/2016   OSTEOARTHRITIS 08/14/2021   Other epilepsy, intractable, without status epilepticus ([place]) 11/01/2016   Pain in right knee 11/13/2020   Photosensitivity 11/22/2016   Psychogenic nonepileptic seizure 07/27/2017   Raynaud's disease without gangrene 11/22/2016   Rectal bleeding 06/11/2021   Right  lower quadrant abdominal pain 06/05/2021   Sacroiliac joint dysfunction of right side 12/19/2019   Sicca syndrome ([place]) 11/22/2016   SLE (systemic lupus erythematosus) ([place]) 03/26/2016   Subclinical hypothyroidism    Thyroid nodule    Transient alteration of awareness 01/01/2016   Wears glasses for reading 08/14/2021  :   Past Surgical History:  Procedure Laterality Date   ABDOMINAL HYSTERECTOMY  2007   CYSTOSCOPY  2007   WITH HYSTERECTOMY   DIAGNOSTIC LAPAROSCOPY  2005   OOPHORECTOMY Left 08/18/2021   ROTATOR CUFF REPAIR Right 2010   SALPINGOOPHORECTOMY  04/16/2011   Procedure: SALPINGO OOPHERECTOMY;  Surgeon: Lovenia Kim, MD;  Location: [facility];  Service: Gynecology;  Laterality: Right;   skull decompression surgery 2005     @ [facility]  :   Current Outpatient Medications:    enoxaparin (LOVENOX) 150 MG/ML injection, Inject 1 mL (150 mg total) into the skin daily., Disp: 5 mL, Rfl: 0   APIXABAN (ELIQUIS) VTE STARTER PACK (10MG AND 5MG), Take as directed on package: start with two-40m tablets twice daily for 7 days. On day 8, switch to one-51mtablet twice daily., Disp: 74 each, Rfl: 0   fluticasone-salmeterol (ADVAIR HFA) 115-21 MCG/ACT inhaler, Inhale 2 puffs into the lungs 2 (two) times daily., Disp: 1 each, Rfl: 12   gabapentin (NEURONTIN) 300 MG capsule, TAKE ONE CAPSULE BY MOUTH AT BEDTIME, Disp: 90 capsule, Rfl: 1   levothyroxine (SYNTHROID) 50 MCG tablet, Take 1 tablet (50 mcg total) by mouth daily., Disp: 90 tablet, Rfl: 1   liothyronine (CYTOMEL) 5 MCG tablet, Take 10 mcg by mouth in the morning., Disp: , Rfl:    Melatonin 10 MG TABS, Take 20 mg by mouth at bedtime., Disp: , Rfl:    methocarbamol (ROBAXIN) 500 MG tablet, Take 1 tablet (500 mg total) by mouth 3 (three) times daily., Disp: 30 tablet, Rfl: 0   ondansetron (ZOFRAN) 4 MG tablet, Take 1 tablet (4 mg total) by mouth every 8 (eight) hours as needed for nausea or vomiting., Disp: 20 tablet, Rfl: 0   oxyCODONE-acetaminophen (PERCOCET/ROXICET) 5-325 MG tablet, Take 1 tablet by mouth every 6 (six) hours as needed for up to 5 days for severe pain., Disp: 20 tablet, Rfl: 0   PARoxetine (PAXIL) 20 MG tablet, Take 1 tablet (20 mg total) by mouth at bedtime., Disp: 90 tablet, Rfl: 3   promethazine (PHENERGAN) 25 MG tablet, Take 1 tablet (25 mg total) by mouth every 8 (eight) hours as needed for nausea or vomiting., Disp: 30 tablet, Rfl: 1   rosuvastatin (CRESTOR) 20 MG tablet, Take 1 tablet (20 mg total) by mouth daily., Disp: 90 tablet, Rfl: 3   SUMAtriptan (IMITREX) 100 MG tablet, Take 1 tablet (100 mg total) by mouth at onset of migraine. May  repeat in 2 hours if headache persists or recurs. Do not take more than 2 doses in 24 hours. (Patient taking differently: Take 100 mg by mouth as needed (at onset of migraine- may repeat once in 2 hours, if no relief- max of 2 doses/24 hours).), Disp: 10 tablet, Rfl: 2   traZODone (DESYREL) 100 MG tablet, TAKE TWO TABLETS BY MOUTH EVERY NIGHT AT BEDTIME (Patient taking differently: Take 200 mg by mouth at bedtime.), Disp: 180 tablet, Rfl: 1:  :   Allergies  Allergen Reactions   Effexor [Venlafaxine] Nausea Only    Rash    Rosuvastatin Nausea Only   Tape Itching and Other (See Comments)  Skin itches if tape is left on for awhile   Tramadol Nausea And Vomiting   Norco [Hydrocodone-Acetaminophen] Rash   Voltaren [Diclofenac] Rash  :   Family History  Problem Relation Age of Onset   Hypertension Mother    CAD Mother    Breast cancer Mother    Migraines Mother    Gout Mother    Heart disease Mother    Crohn's disease Mother    Hypertension Father    CAD Father    CVA Father    Heart disease Father    Colon polyps Father    Liver disease Father    Sjogren's syndrome Sister    Celiac disease Sister    Rheum arthritis Maternal Grandmother    Dementia Maternal Grandmother    Pancreatic cancer Maternal Grandfather    CAD Paternal Grandmother    Macular degeneration Paternal Grandmother    CVA Paternal Grandfather    Autism Son    ADD / ADHD Son    Breast cancer Maternal Aunt    Esophageal cancer Neg Hx    Colon cancer Neg Hx    Stomach cancer Neg Hx   :   Social History   Socioeconomic History   Marital status: Married    Spouse name: Not on file   Number of children: 1   Years of education: Assoc   Highest education level: Not on file  Occupational History   Occupation: nurse  Tobacco Use   Smoking status: Former    Packs/day: 1.00    Years: 10.00    Total pack years: 10.00    Types: Cigarettes    Quit date: 04/04/2004    Years since quitting: 18.1     Passive exposure: Past   Smokeless tobacco: Never  Vaping Use   Vaping Use: Never used  Substance and Sexual Activity   Alcohol use: Not Currently   Drug use: Never   Sexual activity: Yes    Birth control/protection: Surgical  Other Topics Concern   Not on file  Social History Narrative   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   Married   1 son- born 2004 has autism   Enjoys exercise, special olympics with son   Drinks 4-5 cups of coffee a day       Right handed   Social Determinants of Health   Financial Resource Strain: Not on file  Food Insecurity: No Food Insecurity (05/11/2022)   Hunger Vital Sign    Worried About Running Out of Food in the Last Year: Never true    Ran Out of Food in the Last Year: Never true  Transportation Needs: No Transportation Needs (05/11/2022)   PRAPARE - Hydrologist (Medical): No    Lack of Transportation (Non-Medical): No  Physical Activity: Not on file  Stress: Not on file  Social Connections: Not on file  Intimate Partner Violence: Not At Risk (05/11/2022)   Humiliation, Afraid, Rape, and Kick questionnaire    Fear of Current or Ex-Partner: No    Emotionally Abused: No    Physically Abused: No    Sexually Abused: No  :  Review of Systems  Constitutional: Negative.   HENT: Negative.    Eyes: Negative.   Respiratory: Negative.    Cardiovascular: Negative.   Gastrointestinal: Negative.   Genitourinary: Negative.   Musculoskeletal: Negative.   Skin: Negative.   Neurological: Negative.   Endo/Heme/Allergies: Negative.   Psychiatric/Behavioral: Negative.  Exam: Vital signs show temperature of 98.1.  Pulse 73.  Blood pressure 111/70.  Weight is 234 pounds.  @IPVITALS$ @ Physical Exam Vitals reviewed.  HENT:     Head: Normocephalic and atraumatic.  Eyes:     Pupils: Pupils are equal, round, and reactive to light.  Cardiovascular:     Rate and Rhythm: Normal rate and regular rhythm.     Heart sounds: Normal  heart sounds.  Pulmonary:     Effort: Pulmonary effort is normal.     Breath sounds: Normal breath sounds.  Abdominal:     General: Bowel sounds are normal.     Palpations: Abdomen is soft.  Musculoskeletal:        General: No tenderness or deformity. Normal range of motion.     Cervical back: Normal range of motion.  Lymphadenopathy:     Cervical: No cervical adenopathy.  Skin:    General: Skin is warm and dry.     Findings: No erythema or rash.  Neurological:     Mental Status: She is alert and oriented to person, place, and time.  Psychiatric:        Behavior: Behavior normal.        Thought Content: Thought content normal.        Judgment: Judgment normal.     Recent Labs    05/31/22 1351  WBC 4.0  HGB 12.4  HCT 38.3  PLT 241    Recent Labs    05/31/22 1351  NA 135  K 4.0  CL 104  CO2 25  GLUCOSE 106*  BUN 15  CREATININE 0.87  CALCIUM 9.0    Blood smear review: None  Pathology: None    Assessment and Plan: Kim Pham is a very charming 42 year old white female.  She has a pulmonary embolism.  She has the prothrombin 2 gene mutation.  She is heterozygous for this.  Of note, I forgot to mention that she does have 1 child.  She has had 2 miscarriages.  I just wonder if the miscarriages might not be related to this prothrombin 2 gene mutation.  The main problem is this gallbladder that needs to come out.  Typically, I know the surgeons would like to wait a least 3 months before they operate on somebody with a thromboembolic event.  However, this gallbladder is really affecting the quality of life for Kim Pham.  She is incredibly limited as to what she can do and until the gallbladder comes out.  Think the safest way to go we will have her on Lovenox.  I would have her take Lovenox for 5 days before surgery.  I would have her stop the day before surgery.  I would then have her restart Eliquis the day after surgery.  I think this would be reasonable.  I  think a dose of 150 mg daily of Lovenox for 5 days would be a good way to treat her.  As far as the thromboembolic disease itself, she needs to have a follow-up CT angiogram of the chest in April.  I think this would be a good way for [place] to tell how things are responding.  I would have her on 1 year of anticoagulation at a therapeutic level.  Then, I would have her on a maintenance dose which would be 2.5 mg p.o. twice daily.  I think the issue is whether or not she needs this lifelong.  Something tells me that given all of her health issues, she probably  will need lifelong anticoagulation.  She obviously is incredibly knowledgeable given that she is a Merchandiser, retail.  She is on Lovenox for several patients.  I am sure that she can do Lovenox on her cell.  I told her just to let the surgeon know that she and I have seen each other.  I have the Lovenox plan.  I do not see a problem with this.  I think will be safe for her to have gallbladder surgery since this will be laparoscopic and will be done as an outpatient.  I would like to see Kim Pham back in about 2-3 months.

## 2022-06-01 NOTE — Assessment & Plan Note (Signed)
New finding on hypercoag panel.  She saw hematology and will likely need lifelong anti-coagulation.

## 2022-06-03 ENCOUNTER — Encounter (HOSPITAL_COMMUNITY): Payer: Self-pay

## 2022-06-03 ENCOUNTER — Other Ambulatory Visit: Payer: Self-pay | Admitting: General Surgery

## 2022-06-03 NOTE — Patient Instructions (Addendum)
SURGICAL WAITING ROOM VISITATION Patients having surgery or a procedure may have no more than 2 support people in the waiting area - these visitors may rotate.    If the patient needs to stay at the hospital during part of their recovery, the visitor guidelines for inpatient rooms apply. Pre-op nurse will coordinate an appropriate time for 1 support person to accompany patient in pre-op.  This support person may not rotate.    Please refer to the [facility] website for the visitor guidelines for Inpatients (after your surgery is over and you are in a regular room).   Due to an increase in RSV and influenza rates and associated hospitalizations, children ages 80 and under may not visit patients in [place].     Your procedure is scheduled on: 06-08-22   Report to [facility] Main Entrance    Report to admitting at 7:45 AM   Call this number if you have problems the morning of surgery [phone]   Do not eat food :After Midnight.   After Midnight you may have the following liquids until 7:00 AM DAY OF SURGERY  Water Non-Citrus Juices (without pulp, NO RED) Carbonated Beverages Black Coffee (NO MILK/CREAM OR CREAMERS, sugar ok)  Clear Tea (NO MILK/CREAM OR CREAMERS, sugar ok) regular and decaf                             Plain Jell-O (NO RED)                                           Fruit ices (not with fruit pulp, NO RED)                                     Popsicles (NO RED)                                                               Sports drinks like Gatorade (NO RED)                        If you have questions, please contact your surgeon's office.   FOLLOW  ANY ADDITIONAL PRE OP INSTRUCTIONS YOU RECEIVED FROM YOUR SURGEON'S OFFICE!!!     Oral Hygiene is also important to reduce your risk of infection.                                    Remember - BRUSH YOUR TEETH THE MORNING OF SURGERY WITH YOUR REGULAR TOOTHPASTE   Do NOT smoke after  Midnight   Take these medicines the morning of surgery with A SIP OF WATER:   Levothyroxine  Liothyronine  Pantoprazole  Oxycodone if needed  Tylenol if needed  Promethazine if needed  Okay to use inhaler                             You may not  have any metal on your body including hair pins, jewelry, and body piercing             Do not wear make-up, lotions, powders, perfumes or deodorant  Do not wear nail polish including gel and S&S, artificial/acrylic nails, or any other type of covering on natural nails including finger and toenails. If you have artificial nails, gel coating, etc. that needs to be removed by a nail salon please have this removed prior to surgery or surgery may need to be canceled/ delayed if the surgeon/ anesthesia feels like they are unable to be safely monitored.   Do not shave  48 hours prior to surgery.    Do not bring valuables to the hospital. [place].   Contacts, dentures or bridgework may not be worn into surgery.  DO NOT [place]. PHARMACY WILL DISPENSE MEDICATIONS LISTED ON YOUR MEDICATION LIST TO YOU DURING YOUR ADMISSION [place]!    Patients discharged on the day of surgery will not be allowed to drive home.  Someone NEEDS to stay with you for the first 24 hours after anesthesia.              Please read over the following fact sheets you were given: IF [place]  If you received a COVID test during your pre-op visit  it is requested that you wear a mask when out in public, stay away from anyone that may not be feeling well and notify your surgeon if you develop symptoms. If you test positive for Covid or have been in contact with anyone that has tested positive in the last 10 days please notify you surgeon.  [place] - Preparing for Surgery Before surgery, you can play an important role.  Because  skin is not sterile, your skin needs to be as free of germs as possible.  You can reduce the number of germs on your skin by washing with CHG (chlorahexidine gluconate) soap before surgery.  CHG is an antiseptic cleaner which kills germs and bonds with the skin to continue killing germs even after washing. Please DO NOT use if you have an allergy to CHG or antibacterial soaps.  If your skin becomes reddened/irritated stop using the CHG and inform your nurse when you arrive at Short Stay. Do not shave (including legs and underarms) for at least 48 hours prior to the first CHG shower.  You may shave your face/neck.  Please follow these instructions carefully:  1.  Shower with CHG Soap the night before surgery and the  morning of surgery.  2.  If you choose to wash your hair, wash your hair first as usual with your normal  shampoo.  3.  After you shampoo, rinse your hair and body thoroughly to remove the shampoo.                             4.  Use CHG as you would any other liquid soap.  You can apply chg directly to the skin and wash.  Gently with a scrungie or clean washcloth.  5.  Apply the CHG Soap to your body ONLY FROM THE NECK DOWN.   Do   not use on face/ open  Wound or open sores. Avoid contact with eyes, ears mouth and   genitals (private parts).                       Wash face,  Genitals (private parts) with your normal soap.             6.  Wash thoroughly, paying special attention to the area where your    surgery  will be performed.  7.  Thoroughly rinse your body with warm water from the neck down.  8.  DO NOT shower/wash with your normal soap after using and rinsing off the CHG Soap.                9.  Pat yourself dry with a clean towel.            10.  Wear clean pajamas.            11.  Place clean sheets on your bed the night of your first shower and do not  sleep with pets. Day of Surgery : Do not apply any lotions/deodorants the morning of surgery.   Please wear clean clothes to the hospital/surgery center.  FAILURE TO FOLLOW THESE INSTRUCTIONS MAY RESULT IN THE CANCELLATION OF YOUR SURGERY  PATIENT SIGNATURE_________________________________  NURSE SIGNATURE__________________________________  ________________________________________________________________________

## 2022-06-03 NOTE — Progress Notes (Addendum)
COVID Vaccine Completed:  Yes  Date of COVID positive in last 90 days:  No  PCP - Debbrah Alar, NP Cardiologist - Abran Richard, MD for chest pain Neurologist - Metta Clines, DO (last OV 2022)  Chest x-ray - 05-27-22 Epic EKG - 05-26-22 Epic Stress Test - N/A ECHO - 05-12-22 Epic Cardiac Cath - N/A Pacemaker/ICD device last checked: Spinal Cord Stimulator:N/A Heart monitor - 04-29-22 CEW  Bowel Prep - N/A  Sleep Study - N/A CPAP -   Fasting Blood Sugar -   N/A Checks Blood Sugar _____ times a day  Last dose of GLP1 agonist-  N/A GLP1 instructions:  N/A   Last dose of SGLT-2 inhibitors-  N/A SGLT-2 instructions: N/A   Blood Thinner Instructions:  Eliquis. Last dose of Eliquis 06-02-22.   On Lovenox bridge, to stop the day before surgery per patient. Aspirin Instructions: Last Dose:  Activity level:  Can go up a flight of stairs and perform activities of daily living without stopping and without symptoms of chest pain or shortness of breath.  Anesthesia review:  Recent PE diagnosis, saw cardiology for chest pain, palpitations and DOE.   States no recent cardiac issues.  Did have some SOB with PE but that has resolved.  Prothrombin mutation  Hx of seizures, has not had a seizure in several years.    Patient denies shortness of breath, fever, cough and chest pain at PAT appointment  Patient verbalized understanding of instructions that were given to them at the PAT appointment. Patient was also instructed that they will need to review over the PAT instructions again at home before surgery.

## 2022-06-03 NOTE — Progress Notes (Signed)
Surgery orders requested via Epic inbox. °

## 2022-06-04 ENCOUNTER — Encounter (HOSPITAL_COMMUNITY)
Admission: RE | Admit: 2022-06-04 | Discharge: 2022-06-04 | Disposition: A | Discharge status: Home / Self Care | Payer: BC Managed Care – PPO | Source: Ambulatory Visit | Attending: General Surgery | Admitting: General Surgery

## 2022-06-04 ENCOUNTER — Other Ambulatory Visit: Payer: Self-pay

## 2022-06-04 ENCOUNTER — Encounter (HOSPITAL_COMMUNITY): Payer: Self-pay

## 2022-06-04 DIAGNOSIS — M329 Systemic lupus erythematosus, unspecified: Secondary | ICD-10-CM | POA: Diagnosis not present

## 2022-06-04 DIAGNOSIS — Z87891 Personal history of nicotine dependence: Secondary | ICD-10-CM | POA: Insufficient documentation

## 2022-06-04 DIAGNOSIS — K819 Cholecystitis, unspecified: Secondary | ICD-10-CM | POA: Diagnosis not present

## 2022-06-04 DIAGNOSIS — Z01818 Encounter for other preprocedural examination: Secondary | ICD-10-CM | POA: Diagnosis not present

## 2022-06-04 DIAGNOSIS — Z79899 Other long term (current) drug therapy: Secondary | ICD-10-CM | POA: Insufficient documentation

## 2022-06-04 DIAGNOSIS — Z7951 Long term (current) use of inhaled steroids: Secondary | ICD-10-CM | POA: Diagnosis not present

## 2022-06-04 DIAGNOSIS — Z7901 Long term (current) use of anticoagulants: Secondary | ICD-10-CM | POA: Insufficient documentation

## 2022-06-04 DIAGNOSIS — E063 Autoimmune thyroiditis: Secondary | ICD-10-CM | POA: Diagnosis not present

## 2022-06-04 HISTORY — DX: Other specified postprocedural states: R11.2

## 2022-06-04 HISTORY — DX: Other pulmonary embolism without acute cor pulmonale: I26.99

## 2022-06-04 HISTORY — DX: Nausea with vomiting, unspecified: R11.2

## 2022-06-04 HISTORY — DX: Other specified postprocedural states: Z98.890

## 2022-06-04 NOTE — Progress Notes (Signed)
Surgery orders requested with Abigail Butts at Dr. Marlowe Aschoff office.

## 2022-06-07 ENCOUNTER — Encounter (HOSPITAL_COMMUNITY): Payer: Self-pay | Admitting: General Surgery

## 2022-06-07 NOTE — Progress Notes (Signed)
Anesthesia Chart Review   Case: [id number] Date/Time: 06/08/22 0945   Procedure: LAPAROSCOPIC CHOLECYSTECTOMY WITH POSSIBLE INTRAOPERATIVE CHOLANGIOGRAM   Anesthesia type: General   Pre-op diagnosis: CHOLECYSTITIS   Location: [facility] ROOM 04 / [facility]   Surgeons: Stark Klein, MD       DISCUSSION:41 y.o. former smoker with h/o PONV, Lupus, Hashimoto's Thyroiditis, PE, cholecystitis scheduled for above procedure 06/08/2022 with Dr. Stark Klein.   Pt was seen by cardiology 04/20/22. Per OV note she was experiencing exertional shortness of breath, chest discomfort, palpitations.  Zio patch and cardiac CT angiogram ordered.   Unprovoked PE 05/11/2022, 4 weeks ago.   Seen by hematology 05/31/22. Per OV note, "I told her just to let the surgeon know that she and I have seen each other. I have the Lovenox plan. I do not see a problem with this. I think will be safe for her to have gallbladder surgery since this will be laparoscopic and will be done as an outpatient. "  Per PCP, " Pt was sent to hematology for pre-op evaluation and peri-op anticoagulation recommendations.  Pt is cleared from a medical standpoint to proceed with planned surgery with the following hematology recommendations:   Stop Eliquis and start lovenox 5 days prior to surgery. Stop Eliquis one day before surgery. Restart Eliquis post op day 1 at 80m bid.  "  Discussed with Dr. HValma Cava ok to proceed tomorrow.   VS: BP 118/81   Pulse 76   Temp 36.5 C (Oral)   Resp 16   Ht 5' 9"$  (1.753 m)   Wt 105.5 kg   SpO2 99%   BMI 34.35 kg/m   PROVIDERS: ODebbrah Alar NP   LABS: Labs reviewed: Acceptable for surgery. (all labs ordered are listed, but only abnormal results are displayed)  Labs Reviewed - No data to display   IMAGES:   EKG:   CV: Echo 05/12/22 1. Left ventricular ejection fraction, by estimation, is 60 to 65%. The  left ventricle has normal function. The left ventricle has no regional  wall motion  abnormalities. Left ventricular diastolic parameters were  normal.   2. Right ventricular systolic function is normal. The right ventricular  size is normal.   3. The mitral valve is normal in structure. No evidence of mitral valve  regurgitation. No evidence of mitral stenosis.   4. The aortic valve is tricuspid. Aortic valve regurgitation is not  visualized. No aortic stenosis is present.   5. The inferior vena cava is normal in size with greater than 50%  respiratory variability, suggesting right atrial pressure of 3 mmHg.  Past Medical History:  Diagnosis Date   Adnexal mass 02/24/2021   in epic seen on [place]  Amenorrhea 08/14/2021   Anxiety 08/14/2021   Arthralgia 08/18/2016   Chondromalacia, patella 06/30/2021   Chronic constipation 08/14/2021   Chronic headaches HISTORY OF MIGRAINES 08/14/2021   Chronic migraine without aura, with intractable migraine, so stated, with status migrainosus [id number]  Complication of anesthesia 08/14/2021   PONV   Cyst of left ovary 06/17/2021   Degenerative disc disease, lumbar 09/04/2019   Depression with anxiety 03/26/2016   Diarrhea 07/08/2017   Effusion, left knee 12/16/2021   Endometriosis    Fatigue 03/26/2016   Gastroesophageal reflux disease 08/19/2020   Hair loss 11/22/2016   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    High risk medication use 11/22/2016   history of Chiari malformation    History of Chiari malformation 11/22/2016  history of COVID 02/2021   LOW GRADE FEVER CONGESTION AND HEADACHE ALL SYMPTOMS RESOLVED   Hot flashes 06/11/2021   Hyperlipidemia    Insomnia 08/19/2020   Low blood pressure    pt states history of low blood pressure-fainted 12/2010-instructed to move slowly   Nonepileptic episode ([place]) 12/22/2016   Numbness and tingling 02/25/2016   OSTEOARTHRITIS 08/14/2021   Other epilepsy, intractable, without status epilepticus ([place]) 11/01/2016   Pain in right knee 11/13/2020   Photosensitivity  11/22/2016   PONV (postoperative nausea and vomiting)    Psychogenic nonepileptic seizure 07/27/2017   Pulmonary embolism ([place])    Raynaud's disease without gangrene 11/22/2016   Rectal bleeding 06/11/2021   Right lower quadrant abdominal pain 06/05/2021   Sacroiliac joint dysfunction of right side 12/19/2019   Sicca syndrome ([place]) 11/22/2016   SLE (systemic lupus erythematosus) ([place]) 03/26/2016   Subclinical hypothyroidism    Thyroid nodule    Transient alteration of awareness 01/01/2016   Wears glasses for reading 08/14/2021    Past Surgical History:  Procedure Laterality Date   ABDOMINAL HYSTERECTOMY  2007   CYSTOSCOPY  2007   WITH HYSTERECTOMY   DIAGNOSTIC LAPAROSCOPY  2005   OOPHORECTOMY Left 08/18/2021   ROTATOR CUFF REPAIR Right 2010   SALPINGOOPHORECTOMY  04/16/2011   Procedure: SALPINGO OOPHERECTOMY;  Surgeon: Lovenia Kim, MD;  Location: [facility];  Service: Gynecology;  Laterality: Right;   skull decompression surgery 2005     @ [place]:  acetaminophen (TYLENOL) 500 MG tablet   APIXABAN (ELIQUIS) VTE STARTER PACK (10MG AND 5MG)   enoxaparin (LOVENOX) 150 MG/ML injection   fluticasone-salmeterol (ADVAIR HFA) 115-21 MCG/ACT inhaler   gabapentin (NEURONTIN) 300 MG capsule   levothyroxine (SYNTHROID) 50 MCG tablet   liothyronine (CYTOMEL) 5 MCG tablet   Melatonin 10 MG TABS   methocarbamol (ROBAXIN) 500 MG tablet   Multiple Vitamin (MULTIVITAMIN WITH MINERALS) TABS tablet   ondansetron (ZOFRAN) 4 MG tablet   oxyCODONE-acetaminophen (PERCOCET/ROXICET) 5-325 MG tablet   pantoprazole (PROTONIX) 40 MG tablet   PARoxetine (PAXIL) 20 MG tablet   promethazine (PHENERGAN) 25 MG tablet   rosuvastatin (CRESTOR) 20 MG tablet   SUMAtriptan (IMITREX) 100 MG tablet   traZODone (DESYREL) 100 MG tablet   No current facility-administered medications for this encounter.    Konrad Felix Ward, PA-C [facility] [phone]

## 2022-06-07 NOTE — Anesthesia Preprocedure Evaluation (Addendum)
Anesthesia Evaluation  Patient identified by MRN, date of birth, ID band Patient awake    Reviewed: Allergy & Precautions, NPO status , Patient's Chart, lab work & pertinent test results  History of Anesthesia Complications (+) PONV  Airway Mallampati: II  TM Distance: >3 FB Neck ROM: Full    Dental no notable dental hx. (+) Teeth Intact, Dental Advisory Given   Pulmonary former smoker   Pulmonary exam normal        Cardiovascular + CAD  Normal cardiovascular exam+ Valvular Problems/Murmurs  Rhythm:Regular Rate:Normal     Neuro/Psych  Headaches, Seizures -, Well Controlled,  PSYCHIATRIC DISORDERS Anxiety Depression       GI/Hepatic Neg liver ROS,GERD  Medicated,,Cholelithiasis- symptomatic   Endo/Other  Hypothyroidism  Obesity   Renal/GU negative Renal ROS  negative genitourinary   Musculoskeletal  (+) Arthritis , Osteoarthritis,    Abdominal  (+) + obese  Peds  Hematology negative hematology ROS (+)   Anesthesia Other Findings   Reproductive/Obstetrics                             Anesthesia Physical Anesthesia Plan  ASA: 2  Anesthesia Plan: General   Post-op Pain Management:    Induction: Cricoid pressure planned and Intravenous  PONV Risk Score and Plan: 4 or greater and Treatment may vary due to age or medical condition, Scopolamine patch - Pre-op, Diphenhydramine, Ondansetron, Dexamethasone and Midazolam  Airway Management Planned: Oral ETT  Additional Equipment: None  Intra-op Plan:   Post-operative Plan: Extubation in OR  Informed Consent: I have reviewed the patients History and Physical, chart, labs and discussed the procedure including the risks, benefits and alternatives for the proposed anesthesia with the patient or authorized representative who has indicated his/her understanding and acceptance.     Dental advisory given  Plan Discussed with: CRNA and  Anesthesiologist  Anesthesia Plan Comments: (See PAT note 06/04/2022)       Anesthesia Quick Evaluation

## 2022-06-08 ENCOUNTER — Observation Stay (HOSPITAL_COMMUNITY)
Admission: RE | Admit: 2022-06-08 | Discharge: 2022-06-09 | Disposition: A | Discharge status: Home / Self Care | Payer: BC Managed Care – PPO | Source: Ambulatory Visit | Attending: General Surgery | Admitting: General Surgery

## 2022-06-08 ENCOUNTER — Encounter (HOSPITAL_COMMUNITY)
Admission: RE | Disposition: A | Discharge status: Home / Self Care | Payer: Self-pay | Source: Ambulatory Visit | Attending: General Surgery

## 2022-06-08 ENCOUNTER — Ambulatory Visit (HOSPITAL_COMMUNITY): Payer: BC Managed Care – PPO | Admitting: Physician Assistant

## 2022-06-08 ENCOUNTER — Ambulatory Visit (HOSPITAL_COMMUNITY): Payer: BC Managed Care – PPO | Admitting: Anesthesiology

## 2022-06-08 ENCOUNTER — Encounter (HOSPITAL_COMMUNITY): Payer: Self-pay | Admitting: General Surgery

## 2022-06-08 ENCOUNTER — Other Ambulatory Visit: Payer: Self-pay

## 2022-06-08 DIAGNOSIS — I2699 Other pulmonary embolism without acute cor pulmonale: Secondary | ICD-10-CM | POA: Insufficient documentation

## 2022-06-08 DIAGNOSIS — K8010 Calculus of gallbladder with chronic cholecystitis without obstruction: Secondary | ICD-10-CM | POA: Diagnosis not present

## 2022-06-08 DIAGNOSIS — M5412 Radiculopathy, cervical region: Secondary | ICD-10-CM | POA: Diagnosis not present

## 2022-06-08 DIAGNOSIS — Z87891 Personal history of nicotine dependence: Secondary | ICD-10-CM | POA: Insufficient documentation

## 2022-06-08 DIAGNOSIS — Z9049 Acquired absence of other specified parts of digestive tract: Secondary | ICD-10-CM

## 2022-06-08 DIAGNOSIS — Z79899 Other long term (current) drug therapy: Secondary | ICD-10-CM | POA: Diagnosis not present

## 2022-06-08 DIAGNOSIS — Z981 Arthrodesis status: Secondary | ICD-10-CM | POA: Diagnosis not present

## 2022-06-08 DIAGNOSIS — D360 Benign neoplasm of lymph nodes: Secondary | ICD-10-CM | POA: Diagnosis not present

## 2022-06-08 DIAGNOSIS — K811 Chronic cholecystitis: Secondary | ICD-10-CM | POA: Diagnosis not present

## 2022-06-08 HISTORY — PX: CHOLECYSTECTOMY: SHX55

## 2022-06-08 HISTORY — DX: Calculus of gallbladder with chronic cholecystitis without obstruction: K80.10

## 2022-06-08 HISTORY — DX: Acquired absence of other specified parts of digestive tract: Z90.49

## 2022-06-08 SURGERY — LAPAROSCOPIC CHOLECYSTECTOMY WITH INTRAOPERATIVE CHOLANGIOGRAM
Anesthesia: General | Site: Abdomen

## 2022-06-08 MED ORDER — ONDANSETRON HCL 4 MG/2ML IJ SOLN
INTRAMUSCULAR | Status: AC
  Filled 2022-06-08: qty 2

## 2022-06-08 MED ORDER — ROCURONIUM BROMIDE 10 MG/ML (PF) SYRINGE
PREFILLED_SYRINGE | INTRAVENOUS | Status: DC | PRN
  Administered 2022-06-08: 80 mg via INTRAVENOUS

## 2022-06-08 MED ORDER — ONDANSETRON 4 MG PO TBDP: 4.0000 mg | ORAL_TABLET | Freq: Four times a day (QID) | ORAL | Status: DC | PRN

## 2022-06-08 MED ORDER — FENTANYL CITRATE (PF) 250 MCG/5ML IJ SOLN
INTRAMUSCULAR | Status: AC
  Filled 2022-06-08: qty 5

## 2022-06-08 MED ORDER — MORPHINE SULFATE (PF) 2 MG/ML IV SOLN
INTRAVENOUS | Status: AC
  Filled 2022-06-08: qty 1

## 2022-06-08 MED ORDER — EPHEDRINE SULFATE-NACL 50-0.9 MG/10ML-% IV SOSY
PREFILLED_SYRINGE | INTRAVENOUS | Status: DC | PRN
  Administered 2022-06-08: 10 mg via INTRAVENOUS

## 2022-06-08 MED ORDER — OXYCODONE HCL 5 MG PO TABS
ORAL_TABLET | ORAL | Status: AC
  Filled 2022-06-08: qty 1

## 2022-06-08 MED ORDER — SUGAMMADEX SODIUM 200 MG/2ML IV SOLN
INTRAVENOUS | Status: DC | PRN
  Administered 2022-06-08: 300 mg via INTRAVENOUS

## 2022-06-08 MED ORDER — FENTANYL CITRATE (PF) 100 MCG/2ML IJ SOLN
INTRAMUSCULAR | Status: DC | PRN
  Administered 2022-06-08: 100 ug via INTRAVENOUS
  Administered 2022-06-08 (×3): 50 ug via INTRAVENOUS

## 2022-06-08 MED ORDER — PROMETHAZINE HCL 25 MG/ML IJ SOLN
12.5000 mg | Freq: Once | INTRAMUSCULAR | Status: AC
  Administered 2022-06-08: 12.5 mg via INTRAVENOUS

## 2022-06-08 MED ORDER — ONDANSETRON HCL 4 MG/2ML IJ SOLN
4.0000 mg | Freq: Once | INTRAMUSCULAR | Status: AC | PRN
  Administered 2022-06-08: 4 mg via INTRAVENOUS

## 2022-06-08 MED ORDER — ENOXAPARIN SODIUM 80 MG/0.8ML IJ SOSY
70.0000 mg | PREFILLED_SYRINGE | Freq: Once | INTRAMUSCULAR | Status: AC
  Administered 2022-06-09: 70 mg via SUBCUTANEOUS
  Filled 2022-06-08: qty 0.7
  Filled 2022-06-08: qty 0.8

## 2022-06-08 MED ORDER — TRAZODONE HCL 100 MG PO TABS
200.0000 mg | ORAL_TABLET | Freq: Every day | ORAL | Status: DC
  Administered 2022-06-08: 200 mg via ORAL
  Filled 2022-06-08: qty 2

## 2022-06-08 MED ORDER — HYDROMORPHONE HCL 1 MG/ML IJ SOLN
INTRAMUSCULAR | Status: AC
  Administered 2022-06-08: 0.5 mg via INTRAVENOUS
  Filled 2022-06-08: qty 1

## 2022-06-08 MED ORDER — DIPHENHYDRAMINE HCL 50 MG/ML IJ SOLN
INTRAMUSCULAR | Status: AC
  Filled 2022-06-08: qty 1

## 2022-06-08 MED ORDER — DIPHENHYDRAMINE HCL 12.5 MG/5ML PO ELIX: 12.5000 mg | ORAL_SOLUTION | Freq: Four times a day (QID) | ORAL | Status: DC | PRN

## 2022-06-08 MED ORDER — PROPOFOL 10 MG/ML IV BOLUS
INTRAVENOUS | Status: DC | PRN
  Administered 2022-06-08: 150 mg via INTRAVENOUS

## 2022-06-08 MED ORDER — LIOTHYRONINE SODIUM 5 MCG PO TABS
10.0000 ug | ORAL_TABLET | Freq: Every morning | ORAL | Status: DC
  Administered 2022-06-09: 10 ug via ORAL
  Filled 2022-06-08: qty 2

## 2022-06-08 MED ORDER — DIPHENHYDRAMINE HCL 50 MG/ML IJ SOLN: 12.5000 mg | Freq: Four times a day (QID) | INTRAMUSCULAR | Status: DC | PRN

## 2022-06-08 MED ORDER — OXYCODONE HCL 5 MG PO TABS
5.0000 mg | ORAL_TABLET | ORAL | Status: DC | PRN
  Administered 2022-06-08 – 2022-06-09 (×2): 10 mg via ORAL
  Administered 2022-06-09: 5 mg via ORAL
  Administered 2022-06-09 (×2): 10 mg via ORAL
  Filled 2022-06-08 (×2): qty 2
  Filled 2022-06-08: qty 1
  Filled 2022-06-08 (×2): qty 2

## 2022-06-08 MED ORDER — PROPOFOL 10 MG/ML IV BOLUS
INTRAVENOUS | Status: AC
  Filled 2022-06-08: qty 20

## 2022-06-08 MED ORDER — DIPHENHYDRAMINE HCL 50 MG/ML IJ SOLN: 12.5000 mg | Freq: Once | INTRAMUSCULAR | Status: DC

## 2022-06-08 MED ORDER — ONDANSETRON HCL 4 MG/2ML IJ SOLN
4.0000 mg | Freq: Four times a day (QID) | INTRAMUSCULAR | Status: DC | PRN
  Filled 2022-06-08: qty 2

## 2022-06-08 MED ORDER — PANTOPRAZOLE SODIUM 40 MG PO TBEC: 40.0000 mg | DELAYED_RELEASE_TABLET | Freq: Every day | ORAL | Status: DC | PRN

## 2022-06-08 MED ORDER — PROCHLORPERAZINE MALEATE 10 MG PO TABS: 10.0000 mg | ORAL_TABLET | Freq: Four times a day (QID) | ORAL | Status: DC | PRN

## 2022-06-08 MED ORDER — PAROXETINE HCL 20 MG PO TABS
20.0000 mg | ORAL_TABLET | Freq: Every day | ORAL | Status: DC
  Administered 2022-06-08: 20 mg via ORAL
  Filled 2022-06-08: qty 1

## 2022-06-08 MED ORDER — PROCHLORPERAZINE EDISYLATE 10 MG/2ML IJ SOLN
5.0000 mg | Freq: Four times a day (QID) | INTRAMUSCULAR | Status: DC | PRN
  Administered 2022-06-08: 10 mg via INTRAVENOUS
  Filled 2022-06-08: qty 2

## 2022-06-08 MED ORDER — SUMATRIPTAN SUCCINATE 50 MG PO TABS: 100.0000 mg | ORAL_TABLET | ORAL | Status: DC | PRN

## 2022-06-08 MED ORDER — MORPHINE SULFATE (PF) 2 MG/ML IV SOLN
2.0000 mg | INTRAVENOUS | Status: DC | PRN
  Administered 2022-06-08 (×3): 2 mg via INTRAVENOUS

## 2022-06-08 MED ORDER — LACTATED RINGERS IV SOLN: INTRAVENOUS | Status: DC

## 2022-06-08 MED ORDER — LEVOTHYROXINE SODIUM 50 MCG PO TABS
50.0000 ug | ORAL_TABLET | Freq: Every day | ORAL | Status: DC
  Administered 2022-06-09: 50 ug via ORAL
  Filled 2022-06-08: qty 1

## 2022-06-08 MED ORDER — ACETAMINOPHEN 500 MG PO TABS: 1000.0000 mg | ORAL_TABLET | Freq: Four times a day (QID) | ORAL | Status: DC | PRN

## 2022-06-08 MED ORDER — PROMETHAZINE HCL 25 MG PO TABS: 25.0000 mg | ORAL_TABLET | Freq: Three times a day (TID) | ORAL | Status: DC | PRN

## 2022-06-08 MED ORDER — SIMETHICONE 80 MG PO CHEW
40.0000 mg | CHEWABLE_TABLET | Freq: Four times a day (QID) | ORAL | Status: DC | PRN
  Administered 2022-06-08: 40 mg via ORAL
  Filled 2022-06-08: qty 1

## 2022-06-08 MED ORDER — BUPIVACAINE-EPINEPHRINE (PF) 0.25% -1:200000 IJ SOLN
INTRAMUSCULAR | Status: DC | PRN
  Administered 2022-06-08: 30 mL

## 2022-06-08 MED ORDER — GABAPENTIN 100 MG PO CAPS
300.0000 mg | ORAL_CAPSULE | Freq: Every day | ORAL | Status: DC
  Administered 2022-06-08: 300 mg via ORAL
  Filled 2022-06-08: qty 3

## 2022-06-08 MED ORDER — DEXAMETHASONE SODIUM PHOSPHATE 10 MG/ML IJ SOLN
INTRAMUSCULAR | Status: DC | PRN
  Administered 2022-06-08: 4 mg via INTRAVENOUS

## 2022-06-08 MED ORDER — SPY AGENT GREEN - (INDOCYANINE FOR INJECTION)
INTRAMUSCULAR | Status: DC | PRN
  Administered 2022-06-08: 3 mL via INTRAVENOUS

## 2022-06-08 MED ORDER — DIPHENHYDRAMINE HCL 50 MG/ML IJ SOLN
12.5000 mg | Freq: Once | INTRAMUSCULAR | Status: AC
  Administered 2022-06-08: 12.5 mg via INTRAVENOUS

## 2022-06-08 MED ORDER — KCL IN DEXTROSE-NACL 20-5-0.45 MEQ/L-%-% IV SOLN
INTRAVENOUS | Status: DC
  Filled 2022-06-08 (×2): qty 1000

## 2022-06-08 MED ORDER — METHOCARBAMOL 500 MG PO TABS
500.0000 mg | ORAL_TABLET | Freq: Three times a day (TID) | ORAL | Status: DC | PRN
  Administered 2022-06-09: 500 mg via ORAL
  Filled 2022-06-08: qty 1

## 2022-06-08 MED ORDER — CHLORHEXIDINE GLUCONATE 0.12 % MT SOLN
15.0000 mL | Freq: Once | OROMUCOSAL | Status: AC
  Administered 2022-06-08: 15 mL via OROMUCOSAL

## 2022-06-08 MED ORDER — BUPIVACAINE-EPINEPHRINE (PF) 0.25% -1:200000 IJ SOLN
INTRAMUSCULAR | Status: AC
  Filled 2022-06-08: qty 30

## 2022-06-08 MED ORDER — DEXMEDETOMIDINE HCL IN NACL 200 MCG/50ML IV SOLN
INTRAVENOUS | Status: DC | PRN
  Administered 2022-06-08: 8 ug via INTRAVENOUS

## 2022-06-08 MED ORDER — HYDROMORPHONE HCL 1 MG/ML IJ SOLN
0.2500 mg | INTRAMUSCULAR | Status: DC | PRN
  Administered 2022-06-08 (×2): 0.5 mg via INTRAVENOUS

## 2022-06-08 MED ORDER — SCOPOLAMINE 1 MG/3DAYS TD PT72
MEDICATED_PATCH | TRANSDERMAL | Status: AC
  Filled 2022-06-08: qty 1

## 2022-06-08 MED ORDER — SCOPOLAMINE 1 MG/3DAYS TD PT72: 1.0000 | MEDICATED_PATCH | TRANSDERMAL | Status: DC

## 2022-06-08 MED ORDER — OXYCODONE HCL 5 MG PO TABS
5.0000 mg | ORAL_TABLET | Freq: Once | ORAL | Status: AC | PRN
  Administered 2022-06-08: 5 mg via ORAL

## 2022-06-08 MED ORDER — SCOPOLAMINE 1 MG/3DAYS TD PT72
1.0000 | MEDICATED_PATCH | TRANSDERMAL | Status: DC
  Administered 2022-06-08: 1.5 mg via TRANSDERMAL

## 2022-06-08 MED ORDER — AMISULPRIDE (ANTIEMETIC) 5 MG/2ML IV SOLN
10.0000 mg | Freq: Once | INTRAVENOUS | Status: AC | PRN
  Administered 2022-06-08: 10 mg via INTRAVENOUS

## 2022-06-08 MED ORDER — MOMETASONE FURO-FORMOTEROL FUM 200-5 MCG/ACT IN AERO
2.0000 | INHALATION_SPRAY | Freq: Two times a day (BID) | RESPIRATORY_TRACT | Status: DC
  Administered 2022-06-08: 2 via RESPIRATORY_TRACT
  Filled 2022-06-08: qty 8.8

## 2022-06-08 MED ORDER — ONDANSETRON HCL 4 MG PO TABS: 4.0000 mg | ORAL_TABLET | Freq: Three times a day (TID) | ORAL | Status: DC | PRN

## 2022-06-08 MED ORDER — SENNA 8.6 MG PO TABS
1.0000 | ORAL_TABLET | Freq: Two times a day (BID) | ORAL | Status: DC
  Administered 2022-06-08 – 2022-06-09 (×2): 8.6 mg via ORAL
  Filled 2022-06-08 (×2): qty 1

## 2022-06-08 MED ORDER — HYDROMORPHONE HCL 1 MG/ML IJ SOLN
0.5000 mg | INTRAMUSCULAR | Status: DC | PRN
  Administered 2022-06-08 (×2): 1 mg via INTRAVENOUS
  Administered 2022-06-09 (×2): 0.5 mg via INTRAVENOUS
  Filled 2022-06-08 (×4): qty 1

## 2022-06-08 MED ORDER — ADULT MULTIVITAMIN W/MINERALS CH
1.0000 | ORAL_TABLET | Freq: Every day | ORAL | Status: DC
  Administered 2022-06-09: 1 via ORAL
  Filled 2022-06-08: qty 1

## 2022-06-08 MED ORDER — ONDANSETRON HCL 4 MG/2ML IJ SOLN
INTRAMUSCULAR | Status: DC | PRN
  Administered 2022-06-08: 4 mg via INTRAVENOUS

## 2022-06-08 MED ORDER — CEFAZOLIN SODIUM-DEXTROSE 2-4 GM/100ML-% IV SOLN
2.0000 g | Freq: Once | INTRAVENOUS | Status: AC
  Administered 2022-06-08: 2 g via INTRAVENOUS
  Filled 2022-06-08: qty 100

## 2022-06-08 MED ORDER — PROMETHAZINE HCL 25 MG/ML IJ SOLN
INTRAMUSCULAR | Status: AC
  Filled 2022-06-08: qty 1

## 2022-06-08 MED ORDER — OXYCODONE HCL 5 MG/5ML PO SOLN: 5.0000 mg | Freq: Once | ORAL | Status: AC | PRN

## 2022-06-08 MED ORDER — LIDOCAINE 2% (20 MG/ML) 5 ML SYRINGE
INTRAMUSCULAR | Status: DC | PRN
  Administered 2022-06-08: 60 mg via INTRAVENOUS

## 2022-06-08 MED ORDER — MELATONIN 5 MG PO TABS
10.0000 mg | ORAL_TABLET | Freq: Every evening | ORAL | Status: DC | PRN
  Administered 2022-06-09: 10 mg via ORAL
  Filled 2022-06-08: qty 2

## 2022-06-08 MED ORDER — MIDAZOLAM HCL 5 MG/5ML IJ SOLN
INTRAMUSCULAR | Status: DC | PRN
  Administered 2022-06-08: 2 mg via INTRAVENOUS

## 2022-06-08 MED ORDER — 0.9 % SODIUM CHLORIDE (POUR BTL) OPTIME
TOPICAL | Status: DC | PRN
  Administered 2022-06-08: 1000 mL

## 2022-06-08 MED ORDER — OXYCODONE HCL 5 MG PO TABS: 5.0000 mg | ORAL_TABLET | Freq: Four times a day (QID) | ORAL | 0 refills | Status: DC | PRN

## 2022-06-08 MED ORDER — ACETAMINOPHEN 500 MG PO TABS
1000.0000 mg | ORAL_TABLET | Freq: Once | ORAL | Status: AC
  Administered 2022-06-08: 1000 mg via ORAL
  Filled 2022-06-08: qty 2

## 2022-06-08 MED ORDER — ORAL CARE MOUTH RINSE: 15.0000 mL | Freq: Once | OROMUCOSAL | Status: AC

## 2022-06-08 MED ORDER — AMISULPRIDE (ANTIEMETIC) 5 MG/2ML IV SOLN
INTRAVENOUS | Status: AC
  Filled 2022-06-08: qty 4

## 2022-06-08 MED ORDER — MIDAZOLAM HCL 2 MG/2ML IJ SOLN
INTRAMUSCULAR | Status: AC
  Filled 2022-06-08: qty 2

## 2022-06-08 MED ORDER — LACTATED RINGERS IR SOLN
Status: DC | PRN
  Administered 2022-06-08: 1000 mL

## 2022-06-08 SURGICAL SUPPLY — 48 items
ADH SKN CLS APL DERMABOND .7 (GAUZE/BANDAGES/DRESSINGS) ×1
APL PRP STRL LF DISP 70% ISPRP (MISCELLANEOUS) ×1
APPLIER CLIP ROT 10 11.4 M/L (STAPLE) ×1
APR CLP MED LRG 11.4X10 (STAPLE) ×1
BAG COUNTER SPONGE SURGICOUNT (BAG) IMPLANT
BAG SPEC RTRVL 10 TROC 200 (ENDOMECHANICALS)
BAG SPNG CNTER NS LX DISP (BAG)
CABLE HIGH FREQUENCY MONO STRZ (ELECTRODE) IMPLANT
CHLORAPREP W/TINT 26 (MISCELLANEOUS) ×2 IMPLANT
CLIP APPLIE ROT 10 11.4 M/L (STAPLE) ×2 IMPLANT
CLIP LIGATING HEMO O LOK GREEN (MISCELLANEOUS) IMPLANT
COVER MAYO STAND XLG (MISCELLANEOUS) ×2 IMPLANT
COVER SURGICAL LIGHT HANDLE (MISCELLANEOUS) ×2 IMPLANT
DERMABOND ADVANCED .7 DNX12 (GAUZE/BANDAGES/DRESSINGS) IMPLANT
DRAPE C-ARM 42X120 X-RAY (DRAPES) IMPLANT
ELECT L-HOOK LAP 45CM DISP (ELECTROSURGICAL)
ELECT REM PT RETURN 15FT ADLT (MISCELLANEOUS) ×2 IMPLANT
ELECTRODE L-HOOK LAP 45CM DISP (ELECTROSURGICAL) IMPLANT
GLOVE BIO SURGEON STRL SZ 6 (GLOVE) ×2 IMPLANT
GLOVE INDICATOR 6.5 STRL GRN (GLOVE) ×2 IMPLANT
GOWN SPEC L3 XXLG W/TWL (GOWN DISPOSABLE) ×2 IMPLANT
GOWN STRL REUS W/TWL 2XL LVL3 (GOWN DISPOSABLE) ×2 IMPLANT
HEMOSTAT SNOW SURGICEL 2X4 (HEMOSTASIS) IMPLANT
IRRIG SUCT STRYKERFLOW 2 WTIP (MISCELLANEOUS) ×1
IRRIGATION SUCT STRKRFLW 2 WTP (MISCELLANEOUS) ×2 IMPLANT
KIT BASIN OR (CUSTOM PROCEDURE TRAY) ×2 IMPLANT
KIT TURNOVER KIT A (KITS) IMPLANT
L-HOOK LAP DISP 36CM (ELECTROSURGICAL) ×1
LHOOK LAP DISP 36CM (ELECTROSURGICAL) IMPLANT
PENCIL SMOKE EVACUATOR (MISCELLANEOUS) IMPLANT
POUCH RETRIEVAL ECOSAC 10 (ENDOMECHANICALS) ×2 IMPLANT
POUCH RETRIEVAL ECOSAC 10MM (ENDOMECHANICALS)
PROTECTOR NERVE ULNAR (MISCELLANEOUS) IMPLANT
SCISSORS LAP 5X35 DISP (ENDOMECHANICALS) ×2 IMPLANT
SET CHOLANGIOGRAPH MIX (MISCELLANEOUS) IMPLANT
SET TUBE SMOKE EVAC HIGH FLOW (TUBING) IMPLANT
SLEEVE Z-THREAD 5X100MM (TROCAR) ×2 IMPLANT
SPIKE FLUID TRANSFER (MISCELLANEOUS) ×2 IMPLANT
SUT MNCRL AB 4-0 PS2 18 (SUTURE) ×2 IMPLANT
SYS BAG RETRIEVAL 10MM (BASKET) ×1
SYSTEM BAG RETRIEVAL 10MM (BASKET) ×2 IMPLANT
TAPE CLOTH 4X10 WHT NS (GAUZE/BANDAGES/DRESSINGS) IMPLANT
TOWEL OR 17X26 10 PK STRL BLUE (TOWEL DISPOSABLE) ×2 IMPLANT
TOWEL OR NON WOVEN STRL DISP B (DISPOSABLE) ×2 IMPLANT
TRAY LAPAROSCOPIC (CUSTOM PROCEDURE TRAY) ×2 IMPLANT
TROCAR 11X100 Z THREAD (TROCAR) ×2 IMPLANT
TROCAR BALLN 12MMX100 BLUNT (TROCAR) ×2 IMPLANT
TROCAR Z-THREAD OPTICAL 5X100M (TROCAR) ×2 IMPLANT

## 2022-06-08 NOTE — H&P (Signed)
REFERRING PHYSICIAN: Nance Pear, * PROVIDER: Georgianne Fick, MD MRN: [id number] DOB: [birth year] DATE OF ENCOUNTER: 05/17/2022 Subjective   Chief Complaint: Contracted gallbladder and Breast Discharge  History of Present Illness: Kim Pham is a 42 y.o. female who is seen today as an office consultation for evaluation of Contracted gallbladder and Breast Discharge  Pt started having clear nipple discharge November/December 2023. She underwent mammography and ultrasound which were unrevealing as well as an MRI. There were no masses or new findings. There is no focally dilated ducts seen. The patient describes the discharge as spontaneous and intermittent. It is almost always clear but sometimes can be a little bit pink. She says it has never been rust colored or red.  Patient reports that she started having some right upper quadrant pain around 14 days ago. This is definitely her biggest complaint. With regard to her abdominal pain, it feels like it is up under her right rib cage and goes around to her back as well as up to her shoulder sometimes. She went to the emergency department and was found to have a pulmonary embolus. "Ultrasound showed a contracted gallbladder with small polyps. There was some slight increased wall thickness but the this was felt to be related to under distention. There was no sonographic Murphy's sign. During that admission she underwent CT abdomen pelvis which was also unremarkable for acute pain. She was seen to have mild colonic diverticulosis.   She was admitted for 2 days for the pulmonary embolus and started on Eliquis. However, she is continue to have the pain. This pain is definitely worse when she eats and is worse with fattier foods. The pain is bad enough and the nausea that she has lost weight from food avoidance. She declines jaundice. While she was in the hospital her transaminases and as well as alkaline phosphatase and bilirubin were  normal.Lipase was also normal   MM dx with u/s BCG 04/14/2022 ACR Breast Density Category b: There are scattered areas of fibroglandular density.  FINDINGS: Mammogram:  Left breast: Spot compression tomosynthesis views of the retroareolar left breast were performed in addition to standard views. There is no new abnormality in the retroareolar aspect. There is a stable small asymmetry best seen on cc view in the outer left breast.  On physical exam the patient is unable to express discharge from the left nipple. There are no periareolar skin changes or nipple abnormality.  Ultrasound:  Targeted ultrasound performed throughout the retroareolar aspect of the left breast demonstrating no cystic or solid mass or focally dilated duct.  IMPRESSION: 1. Stable probably benign asymmetry without sonographic correlate in the outer left breast.  2. No mammographic or sonographic imaging abnormality in the retroareolar aspect to explain the patient's spontaneous left nipple discharge.  RECOMMENDATION: 1. Bilateral breast MRI.  2. Surgical referral for left breast nipple discharge.  3. If the MRI is negative recommend diagnostic bilateral mammogram in June 2024 for the probably benign left breast asymmetry and for annual exam of the right breast.  I have discussed the findings and recommendations with the patient. If applicable, a reminder letter will be sent to the patient regarding the next appointment.  BI-RADS CATEGORY 3: Probably benign.  Breast MR 05/08/2022 IMPRESSION: Unremarkable MRI evaluation of the bilateral breasts.  RECOMMENDATION: Surgical referral for left nipple discharge and bilateral diagnostic mammogram in June 2024 per diagnostic report dated 04/14/2022.  BI-RADS CATEGORY 1: Negative.   RUQ u/s 05/10/2022 IMPRESSION: 1. Contracted gallbladder with  small polyps. Increased wall thickness at 3.9 mm likely related to under distension.  Negative sonographic Murphy. 2. Otherwise negative right upper quadrant abdominal ultrasound.  CT abd/pelvis 05/11/2022 IMPRESSION: 1. No acute abdominal/pelvic pathology. No renal/ureteral calculi or obstructive uropathy. 2. Increased density in the appendiceal base and tip, possibly developing appendicolith or retained hyperdense material. No evidence of acute appendicitis. 3. Mild colonic diverticulosis without evidence of acute diverticulitis. 4. Transitional lumbosacral anatomy with sacralization of L5.  CTA chest 05/11/2022 IMPRESSION: 1. Small pulmonary emboli within the RIGHT LOWER lobe posterior basal segmental branch of the pulmonary artery. 2. No evidence for RIGHT heart strain.  Review of Systems: A complete review of systems was obtained from the patient. I have reviewed this information and discussed as appropriate with the patient. See HPI as well for other ROS.  Review of Systems  Respiratory: Positive for shortness of breath.  Gastrointestinal: Positive for abdominal pain, heartburn and nausea.  Neurological: Positive for headaches.  Endo/Heme/Allergies: Bruises/bleeds easily.  Psychiatric/Behavioral: Positive for depression.    Medical History: Past Medical History:  Diagnosis Date  Anxiety  Arthritis  DVT (deep venous thrombosis) (CMS-HCC)  GERD (gastroesophageal reflux disease)  Hyperlipidemia  Pulmonary embolism (CMS-HCC)  Thyroid disease   Patient Active Problem List  Diagnosis  RUQ pain  Acute pulmonary embolism without acute cor pulmonale (CMS-HCC)  On continuous oral anticoagulation  Nipple discharge   Past Surgical History:  Procedure Laterality Date  Rotator Cuff Surgery 2019  HYSTERECTOMY 2022  Knee Surgery 2022    Allergies  Allergen Reactions  Adhesive Itching and Other (See Comments)  Skin itches if tape is left on for awhile  Rosuvastatin Nausea  Tramadol Nausea And Vomiting  Venlafaxine Nausea and Nausea And Vomiting   insomnia  insomnia insomnia  Rash  Diclofenac Rash  Hydrocodone-Acetaminophen Rash   Current Outpatient Medications on File Prior to Visit  Medication Sig Dispense Refill  apixaban 5 mg (74 tabs) DsPk Take as directed on package: start with two-77m tablets twice daily for 7 days. On day 8, switch to one-552mtablet twice daily.  gabapentin (NEURONTIN) 300 MG capsule Take 1 capsule by mouth at bedtime  PARoxetine (PAXIL) 20 MG tablet Take 20 mg by mouth at bedtime  traZODone (DESYREL) 100 MG tablet Take 2 tablets by mouth at bedtime  levothyroxine (SYNTHROID) 25 MCG tablet Take by mouth  liothyronine (CYTOMEL) 5 MCG tablet Take by mouth   No current facility-administered medications on file prior to visit.   Family History  Problem Relation Age of Onset  High blood pressure (Hypertension) Mother  Hyperlipidemia (Elevated cholesterol) Mother  Coronary Artery Disease (Blocked arteries around heart) Mother  Skin cancer Father  High blood pressure (Hypertension) Father  Hyperlipidemia (Elevated cholesterol) Father  Coronary Artery Disease (Blocked arteries around heart) Father    Social History   Tobacco Use  Smoking Status Former  Types: Cigarettes  Quit date: 2005  Years since quitting: 19.0  Smokeless Tobacco Never    Social History   Socioeconomic History  Marital status: Married  Tobacco Use  Smoking status: Former  Types: Cigarettes  Quit date: 2005  Years since quitting: 19.0  Smokeless tobacco: Never  Substance and Sexual Activity  Alcohol use: Never  Drug use: Never   Objective:   Vitals:  05/17/22 1451  BP: 124/80  Pulse: 99  Temp: 36.8 C (98.2 F)  SpO2: 98%  Weight: (!) 103.2 kg (227 lb 9.6 oz)  Height: 175.3 cm (5' 9"$ )  PainSc: 2  Body mass index is 33.61 kg/m.  Head: Normocephalic and atraumatic.  Mouth/Throat: Oropharynx is clear and moist. No oropharyngeal exudate.  Eyes: Conjunctivae are normal. Pupils are equal, round, and  reactive to light. No scleral icterus.  Neck: Normal range of motion. Neck supple. No tracheal deviation present. No thyromegaly present.  Cardiovascular: Normal rate, regular rhythm, normal heart sounds and intact distal pulses. Exam reveals no gallop and no friction rub.  No murmur heard. Breast: Breasts are relatively symmetric. I cannot elicit any discharge from the left nipple, however I do see a very small duct in the center of the nipple. There is no palpable masses. No skin dimpling. No nipple retraction, no lymphadenopathy. No contour change. Breasts feel heterogeneously dense. Mild ptosis. Right breast is benign. Respiratory: Effort normal and breath sounds normal. No respiratory distress. No wheezes, rales or rhonchi. No chest wall tenderness.  GI: Soft. Bowel sounds are normal. Abdomen is soft and non distended. Epigastric and RUQ tenderness present. No masses or hepatosplenomegaly is present. There is no rebound and no guarding.  Musculoskeletal: . Extremities are non tender and without deformity.  Lymphadenopathy: No cervical or axillary adenopathy.  Neurological: Alert and oriented to person, place, and time. Coordination normal.  Skin: Skin is warm and dry. No rash noted. No diaphoresis. No erythema. No pallor.  Psychiatric: Normal mood and affect.Behavior is normal. Judgment and thought content normal.   Labs, Imaging and Diagnostic Testing: See above.   Assessment and Plan:   Diagnoses and all orders for this visit:  RUQ pain - NM hepatobiliary imaging with pharmacologic intervention  Acute pulmonary embolism without acute cor pulmonale, unspecified pulmonary embolism type (CMS-HCC)  On continuous oral anticoagulation  Nipple discharge   The patient does have symptoms consistent with gallbladder disease. However she has a new diagnosis of pulmonary embolus within the last week. I am going to refer her HIDA scan. If she appears to have cholecystitis, we will need to  pause her anticoagulation and take out her gallbladder. If she does not have cholecystitis, we will reassess in another 6 weeks. I reviewed lap chole with the patient and her husband. I discussed incisions. I reviewed risks including bleeding, infection, damage to adjacent structures, bile leak, heart or lung complications, possible exacerbation of the pulmonary embolus, possible new blood clot, others. Reviewed diagrams of anatomy as well. I discussed postoperative restrictions.  With regard to the nipple discharge, this has been clear thus far and I would not plan to do a major duct excision based on all the negative imaging. I will reassess this as well in several months. If the discharge continues to bother her, we could do a major duct excision to try to get this to stop. However at this point I do not think that that is the priority.   Georgianne Fick, MD

## 2022-06-08 NOTE — Anesthesia Procedure Notes (Signed)
Procedure Name: Intubation Date/Time: 06/08/2022 11:21 AM  Performed by: Lavina Hamman, CRNAPre-anesthesia Checklist: Patient identified, Emergency Drugs available, Suction available and Patient being monitored Patient Re-evaluated:Patient Re-evaluated prior to induction Oxygen Delivery Method: Circle System Utilized Preoxygenation: Pre-oxygenation with 100% oxygen Induction Type: IV induction Ventilation: Mask ventilation without difficulty Laryngoscope Size: Mac and 4 Grade View: Grade II Tube type: Oral Tube size: 7.0 mm Number of attempts: 1 Airway Equipment and Method: Stylet and Oral airway Placement Confirmation: ETT inserted through vocal cords under direct vision, positive ETCO2 and breath sounds checked- equal and bilateral Secured at: 22 cm Tube secured with: Tape Dental Injury: Teeth and Oropharynx as per pre-operative assessment

## 2022-06-08 NOTE — Op Note (Signed)
Laparoscopic Cholecystectomy with intraoperative indocyanine green cholangiography.   Indications: This patient presents with symptomatic cholelithiasis and chronic cholecystitis and will undergo laparoscopic cholecystectomy.  Pre-operative Diagnosis: see above  Post-operative Diagnosis: Same  Surgeon: Stark Klein   Assistants: Judyann Munson, RNFA  Anesthesia: General endotracheal anesthesia and local  ASA Class: 2  Procedure Details   The patient was seen again in the Holding Room. The risks, benefits, complications, treatment options, and expected outcomes were discussed with the patient. The possibilities of  bleeding, recurrent infection, damage to nearby structures, the need for additional procedures, failure to diagnose a condition, the possible need to convert to an open procedure, and creating a complication requiring transfusion or operation were discussed with the patient. The likelihood of improving the patient's symptoms with return to their baseline status is good.    The patient and/or family concurred with the proposed plan, giving informed consent. The site of surgery properly noted. The patient was taken to OR # 4 and placed supine on the OR table.  SCDs were placed. Antibiotic prophylaxis was administered. General endotracheal anesthesia was then administered and tolerated well. After the induction, the abdomen was prepped with Chloraprep and draped in the sterile fashion. and the procedure verified as Laparoscopic Cholecystectomy with possible Intraoperative Cholangiogram. A Time Out was held and the above information confirmed. ICG was injected IV.    Local anesthetic agent was injected into the skin near the umbilicus and a vertical midline incision was made with a #11 blade. I dissected down to the abdominal fascia with blunt dissection.  The fascia was incised vertically and the peritoneal cavity was entered bluntly.  A pursestring suture of 0-Vicryl was placed around  the fascial opening.  The Hasson cannula was inserted and secured with the stay suture.  Pneumoperitoneum was then created with CO2 and tolerated well without any adverse changes in the patient's vital signs. An 11-mm port was placed in the subxiphoid position.  Two 5-mm ports were placed in the right upper quadrant. All skin incisions were infiltrated with a local anesthetic agent before making the incision and placing the trocars.   The patient was placed into reverse Trendelenburg position and was tilted slightly to the patient's left.  The gallbladder was identified, and there were some adhesions noted of the infundibulum to the duodenum.  Adhesions were lysed bluntly and with the electrocautery where indicated, taking care not to injure any adjacent organs or viscus. The fundus of the gallbladder was then grasped and retracted cephalad.  The infundibulum was grasped and retracted laterally, exposing the peritoneum overlying the triangle of Calot. The cystic duct and cystic artery were identified.  These were both skeletonized.  A window between the gallbladder and the liver was obtained to ensure a critical view.    The cystic artery was very small and anterior.  This ligated with clips and divided.  The ICG cholangiography was then performed.  The cystic duct was clearly identified and we were far away from the common duct.    The cystic duct was ligated with a clip distally.  The cystic duct was then ligated proximally with three clips and divided.   The gallbladder was dissected from the liver bed in retrograde fashion with the electrocautery. The gallbladder was removed and placed in a retrieval bag.  The gallbladder and bag were then removed through the umbilical port site.  The liver bed was irrigated and inspected. Hemostasis was achieved with the electrocautery. Copious irrigation was utilized and was repeatedly  aspirated until clear.    Pneumoperitoneum was released as we removed the trocars.    The pursestring suture was used to close the umbilical fascia.    4-0 Monocryl was used to close the skin in subcuticular fashion.   The skin was cleaned and dry, and Dermabond was applied. The patient was then extubated and brought to the recovery room in stable condition. Instrument, sponge, and needle counts were correct at closure and at the conclusion of the case.   Findings: Mild chronic inflammation.    Estimated Blood Loss: min         Drains: none          Specimens: Gallbladder to pathology       Complications: None; patient tolerated the procedure well.         Disposition: PACU - hemodynamically stable.         Condition: stable

## 2022-06-08 NOTE — Discharge Instructions (Addendum)
Start lovenox back tomorrow AM and eliquis back per hematology instructions.   [place] Office Phone Number [phone]   POST OP INSTRUCTIONS  Always review your discharge instruction sheet given to you by the facility where your surgery was performed.  IF YOU HAVE DISABILITY OR FAMILY LEAVE FORMS, YOU MUST BRING THEM TO THE OFFICE FOR PROCESSING.  DO NOT GIVE THEM TO YOUR DOCTOR.  Take 2 tylenol (acetominophen) three times a day for 3 days.  If you still have pain, add ibuprofen with food in between if able to take this (if you have kidney issues or stomach issues, do not take ibuprofen).  If both of those are not enough, add the narcotic pain pill.  If you find you are needing a lot of this overnight after surgery, call the next morning for a refill.   Take your usually prescribed medications unless otherwise directed If you need a refill on your pain medication, please contact your pharmacy.  They will contact our office to request authorization.  Prescriptions will not be filled after 5pm or on week-ends. You should eat very light the first 24 hours after surgery, such as soup, crackers, pudding, etc.  Resume your normal diet the day after surgery It is common to experience some constipation if taking pain medication after surgery.  Increasing fluid intake and taking a stool softener will usually help or prevent this problem from occurring.  A mild laxative (Milk of Magnesia or Miralax) should be taken according to package directions if there are no bowel movements after 48 hours. You may shower in 48 hours.  The surgical glue will flake off in 2-3 weeks.   ACTIVITIES:  No strenuous activity or heavy lifting for 2 weeks. You may drive when you no longer are taking prescription pain medication, you can comfortably wear a seatbelt, and you can safely maneuver your car and apply brakes. RETURN TO WORK:  __________as tolerated if no lifting for 2 weeks.  _______________ Dennis Bast  should see your doctor in the office for a follow-up appointment approximately three-four weeks after your surgery.    WHEN TO CALL YOUR DOCTOR: Fever over 101.0 Nausea and/or vomiting. Extreme swelling or bruising. Continued bleeding from incision. Increased pain, redness, or drainage from the incision.  The clinic staff is available to answer your questions during regular business hours.  Please don't hesitate to call and ask to speak to one of the nurses for clinical concerns.  If you have a medical emergency, go to the nearest emergency room or call 911.  A surgeon from [facility] is always on call at the hospital.  For further questions, please visit [email or web address]

## 2022-06-08 NOTE — Transfer of Care (Signed)
Immediate Anesthesia Transfer of Care Note  Patient: Kim Pham  Procedure(s) Performed: Procedure(s): LAPAROSCOPIC CHOLECYSTECTOMY WITH ICG (N/A)  Patient Location: PACU  Anesthesia Type:General  Level of Consciousness:  sedated, patient cooperative and responds to stimulation  Airway & Oxygen Therapy:Patient Spontanous Breathing and Patient connected to face mask oxgen  Post-op Assessment:  Report given to PACU RN and Post -op Vital signs reviewed and stable  Post vital signs:  Reviewed and stable  Last Vitals:  Vitals:   06/08/22 0752 06/08/22 1225  BP: 115/73 132/84  Pulse: 73 95  Resp: 16 16  Temp: 36.6 C   SpO2: [id number]    Complications: No apparent anesthesia complications

## 2022-06-08 NOTE — Interval H&P Note (Signed)
History and Physical Interval Note:  06/08/2022 8:34 AM  Kim Pham  has presented today for surgery, with the diagnosis of CHOLECYSTITIS.  The various methods of treatment have been discussed with the patient and family. After consideration of risks, benefits and other options for treatment, the patient has consented to  Procedure(s): LAPAROSCOPIC CHOLECYSTECTOMY WITH POSSIBLE INTRAOPERATIVE CHOLANGIOGRAM (N/A) as a surgical intervention.  The patient's history has been reviewed, patient examined, no change in status, stable for surgery.  I have reviewed the patient's chart and labs.  Questions were answered to the patient's satisfaction.     Stark Klein

## 2022-06-08 NOTE — Anesthesia Postprocedure Evaluation (Addendum)
Anesthesia Post Note  Patient: Kim Pham  Procedure(s) Performed: LAPAROSCOPIC CHOLECYSTECTOMY WITH ICG (Abdomen)     Patient location during evaluation: PACU Anesthesia Type: General Level of consciousness: awake and alert and oriented Vital Signs Assessment: post-procedure vital signs reviewed and stable Respiratory status: spontaneous breathing, nonlabored ventilation and respiratory function stable Cardiovascular status: blood pressure returned to baseline and stable Anesthetic complications: yes Comments: Patient having post op pain and PONV which has been difficult to control with medications. Discussed with patient and I feel like it is best for her to be admitted for overnight observation and further treatment of her pain with IV medications, IVF and IV antiemetics.   No notable events documented.  Last Vitals:  Vitals:   06/08/22 1515 06/08/22 1530  BP:  126/73  Pulse: 71 70  Resp: 13 20  Temp:    SpO2: 94% 95%    Last Pain:  Vitals:   06/08/22 1452  TempSrc:   PainSc: 8                  Bathsheba Durrett A.

## 2022-06-09 ENCOUNTER — Encounter (HOSPITAL_COMMUNITY): Payer: Self-pay | Admitting: General Surgery

## 2022-06-09 DIAGNOSIS — D360 Benign neoplasm of lymph nodes: Secondary | ICD-10-CM | POA: Diagnosis not present

## 2022-06-09 DIAGNOSIS — I2699 Other pulmonary embolism without acute cor pulmonale: Secondary | ICD-10-CM | POA: Diagnosis not present

## 2022-06-09 DIAGNOSIS — K8010 Calculus of gallbladder with chronic cholecystitis without obstruction: Secondary | ICD-10-CM | POA: Diagnosis not present

## 2022-06-09 DIAGNOSIS — Z79899 Other long term (current) drug therapy: Secondary | ICD-10-CM | POA: Diagnosis not present

## 2022-06-09 DIAGNOSIS — Z87891 Personal history of nicotine dependence: Secondary | ICD-10-CM | POA: Diagnosis not present

## 2022-06-09 LAB — SURGICAL PATHOLOGY

## 2022-06-09 NOTE — TOC CM/SW Note (Signed)
Transition of Care [facility]) Screening Note  Patient Details  Name: MYKELL LUNEAU Date of Birth: 22-Aug-1980  Transition of Care [facility]) CM/SW Contact:    Sherie Don, LCSW Phone Number: 06/09/2022, 8:45 AM  Transition of Care Department [facility]) has reviewed patient and no TOC needs have been identified at this time. We will continue to monitor patient advancement through interdisciplinary progression rounds. If new patient transition needs arise, please place a TOC consult.

## 2022-06-09 NOTE — Plan of Care (Signed)

## 2022-06-09 NOTE — Discharge Summary (Signed)
Physician Discharge Summary  Patient ID: Kim Pham MRN: [id number] DOB/AGE: 11-14-1980 42 y.o.  Admit date: 06/08/2022 Discharge date: 06/09/2022  Admission Diagnoses: Acute calculous cholecystitis   Discharge Diagnoses:  Principal Problem:   Status post laparoscopic cholecystectomy Active Problems:   Chronic calculous cholecystitis  Discharged Condition: stable  Hospital Course:  Patient underwent lap chole with ICG cholangiography 06/08/2022.  She had significant pain and nausea post op and required overnight stay.  This is improved today.  She is ambulating and has been able to eat.  She is still having some nausea intermittently.    Consults: None  Significant Diagnostic Studies: n/a  Treatments: surgery: see above  Discharge Exam: Blood pressure 121/81, pulse 69, temperature 97.8 F (36.6 C), resp. rate 17, height 5' 9"$  (1.753 m), weight 105.5 kg, SpO2 97 %. General appearance: alert, cooperative, and mild distress Resp: breathing comfortably Cardio: regular rate and rhythm Extremities: no edema Abd: soft, approp tender.    Disposition: Discharge disposition: 01-Home or Self Care       Discharge Instructions     Call MD for:  difficulty breathing, headache or visual disturbances   Complete by: As directed    Call MD for:  difficulty breathing, headache or visual disturbances   Complete by: As directed    Call MD for:  hives   Complete by: As directed    Call MD for:  hives   Complete by: As directed    Call MD for:  persistant nausea and vomiting   Complete by: As directed    Call MD for:  persistant nausea and vomiting   Complete by: As directed    Call MD for:  redness, tenderness, or signs of infection (pain, swelling, redness, odor or green/yellow discharge around incision site)   Complete by: As directed    Call MD for:  redness, tenderness, or signs of infection (pain, swelling, redness, odor or green/yellow discharge around incision site)    Complete by: As directed    Call MD for:  severe uncontrolled pain   Complete by: As directed    Call MD for:  severe uncontrolled pain   Complete by: As directed    Call MD for:  temperature >100.4   Complete by: As directed    Call MD for:  temperature >100.4   Complete by: As directed    Diet - low sodium heart healthy   Complete by: As directed    Diet - low sodium heart healthy   Complete by: As directed    Increase activity slowly   Complete by: As directed    Increase activity slowly   Complete by: As directed       Allergies as of 06/09/2022       Reactions   Rosuvastatin Nausea Only   Tape Itching, Other (See Comments)   Skin itches if tape is left on for awhile   Tramadol Nausea And Vomiting   Effexor [venlafaxine] Nausea Only, Rash   Norco [hydrocodone-acetaminophen] Rash   Voltaren [diclofenac] Rash        Medication List     TAKE these medications    acetaminophen 500 MG tablet Commonly known as: TYLENOL Take 1,000 mg by mouth every 6 (six) hours as needed for moderate pain.   Eliquis DVT/PE Starter Pack Generic drug: Apixaban Starter Pack (28m and 530m Take as directed on package: start with two-62m18mablets twice daily for 7 days. On day 8, switch to one-62mg63mblet twice daily. What  changed:  how much to take how to take this when to take this additional instructions   enoxaparin 150 MG/ML injection Commonly known as: Lovenox Inject 1 mL (150 mg total) into the skin daily.   fluticasone-salmeterol 115-21 MCG/ACT inhaler Commonly known as: Advair HFA Inhale 2 puffs into the lungs 2 (two) times daily. What changed: when to take this   gabapentin 300 MG capsule Commonly known as: NEURONTIN TAKE ONE CAPSULE BY MOUTH AT BEDTIME   levothyroxine 50 MCG tablet Commonly known as: SYNTHROID Take 1 tablet (50 mcg total) by mouth daily.   liothyronine 5 MCG tablet Commonly known as: CYTOMEL Take 10 mcg by mouth in the morning.   Melatonin 10  MG Tabs Take 10 mg by mouth at bedtime as needed (sleep).   methocarbamol 500 MG tablet Commonly known as: ROBAXIN Take 1 tablet (500 mg total) by mouth 3 (three) times daily. What changed:  when to take this reasons to take this   multivitamin with minerals Tabs tablet Take 1 tablet by mouth daily.   ondansetron 4 MG tablet Commonly known as: Zofran Take 1 tablet (4 mg total) by mouth every 8 (eight) hours as needed for nausea or vomiting.   oxyCODONE 5 MG immediate release tablet Commonly known as: Oxy IR/ROXICODONE Take 1 tablet (5 mg total) by mouth every 6 (six) hours as needed for severe pain.   oxyCODONE-acetaminophen 5-325 MG tablet Commonly known as: PERCOCET/ROXICET Take 1 tablet by mouth every 6 (six) hours as needed for severe pain.   pantoprazole 40 MG tablet Commonly known as: PROTONIX Take 40 mg by mouth daily as needed (acid reflux).   PARoxetine 20 MG tablet Commonly known as: PAXIL Take 1 tablet (20 mg total) by mouth at bedtime.   promethazine 25 MG tablet Commonly known as: PHENERGAN Take 1 tablet (25 mg total) by mouth every 8 (eight) hours as needed for nausea or vomiting.   rosuvastatin 20 MG tablet Commonly known as: CRESTOR Take 1 tablet (20 mg total) by mouth daily.   SUMAtriptan 100 MG tablet Commonly known as: IMITREX Take 1 tablet (100 mg total) by mouth at onset of migraine. May repeat in 2 hours if headache persists or recurs. Do not take more than 2 doses in 24 hours. What changed:  how much to take how to take this when to take this reasons to take this additional instructions   traZODone 100 MG tablet Commonly known as: DESYREL TAKE TWO TABLETS BY MOUTH EVERY NIGHT AT BEDTIME        Follow-up Information     Stark Klein, MD Follow up in 2 week(s).   Specialty: General Surgery Contact information: [address] [place] [address] [phone]                 Signed: Stark Klein 06/09/2022, 2:03 PM

## 2022-06-10 DIAGNOSIS — Z9889 Other specified postprocedural states: Secondary | ICD-10-CM | POA: Insufficient documentation

## 2022-06-10 DIAGNOSIS — R112 Nausea with vomiting, unspecified: Secondary | ICD-10-CM | POA: Insufficient documentation

## 2022-06-14 DIAGNOSIS — R1310 Dysphagia, unspecified: Secondary | ICD-10-CM | POA: Diagnosis not present

## 2022-06-14 DIAGNOSIS — K219 Gastro-esophageal reflux disease without esophagitis: Secondary | ICD-10-CM | POA: Diagnosis not present

## 2022-06-14 DIAGNOSIS — R490 Dysphonia: Secondary | ICD-10-CM | POA: Diagnosis not present

## 2022-06-16 ENCOUNTER — Telehealth: Payer: Self-pay

## 2022-06-16 DIAGNOSIS — R0609 Other forms of dyspnea: Secondary | ICD-10-CM | POA: Diagnosis not present

## 2022-06-16 DIAGNOSIS — R0789 Other chest pain: Secondary | ICD-10-CM | POA: Diagnosis not present

## 2022-06-16 NOTE — Telephone Encounter (Signed)
Called patient but no answer, lvm for her to call back and let her know her FMLA forms are ready. Need to know if she will pick up or provide a fax number for [place] to submit forms

## 2022-06-17 ENCOUNTER — Ambulatory Visit: Payer: BC Managed Care – PPO | Admitting: Cardiology

## 2022-06-18 ENCOUNTER — Encounter: Payer: Self-pay | Admitting: Family

## 2022-06-18 ENCOUNTER — Ambulatory Visit: Payer: BC Managed Care – PPO | Admitting: Family

## 2022-06-18 DIAGNOSIS — K219 Gastro-esophageal reflux disease without esophagitis: Secondary | ICD-10-CM | POA: Diagnosis not present

## 2022-06-18 DIAGNOSIS — R1310 Dysphagia, unspecified: Secondary | ICD-10-CM | POA: Diagnosis not present

## 2022-06-18 NOTE — Telephone Encounter (Signed)
Patient provided fax number and forms sent

## 2022-06-22 ENCOUNTER — Ambulatory Visit: Payer: BC Managed Care – PPO | Admitting: Family

## 2022-06-23 ENCOUNTER — Emergency Department (HOSPITAL_BASED_OUTPATIENT_CLINIC_OR_DEPARTMENT_OTHER): Payer: BC Managed Care – PPO

## 2022-06-23 ENCOUNTER — Encounter (HOSPITAL_BASED_OUTPATIENT_CLINIC_OR_DEPARTMENT_OTHER): Payer: Self-pay | Admitting: Radiology

## 2022-06-23 ENCOUNTER — Emergency Department (HOSPITAL_BASED_OUTPATIENT_CLINIC_OR_DEPARTMENT_OTHER)
Admission: EM | Admit: 2022-06-23 | Discharge: 2022-06-23 | Disposition: A | Discharge status: Home / Self Care | Payer: BC Managed Care – PPO | Attending: Emergency Medicine | Admitting: Emergency Medicine

## 2022-06-23 ENCOUNTER — Other Ambulatory Visit: Payer: Self-pay

## 2022-06-23 DIAGNOSIS — R079 Chest pain, unspecified: Secondary | ICD-10-CM | POA: Diagnosis not present

## 2022-06-23 DIAGNOSIS — R0602 Shortness of breath: Secondary | ICD-10-CM | POA: Diagnosis not present

## 2022-06-23 DIAGNOSIS — Z7901 Long term (current) use of anticoagulants: Secondary | ICD-10-CM | POA: Insufficient documentation

## 2022-06-23 DIAGNOSIS — R042 Hemoptysis: Secondary | ICD-10-CM | POA: Diagnosis not present

## 2022-06-23 DIAGNOSIS — Z1152 Encounter for screening for COVID-19: Secondary | ICD-10-CM | POA: Insufficient documentation

## 2022-06-23 DIAGNOSIS — M549 Dorsalgia, unspecified: Secondary | ICD-10-CM | POA: Diagnosis not present

## 2022-06-23 DIAGNOSIS — J9811 Atelectasis: Secondary | ICD-10-CM | POA: Diagnosis not present

## 2022-06-23 DIAGNOSIS — R0789 Other chest pain: Secondary | ICD-10-CM | POA: Diagnosis not present

## 2022-06-23 LAB — CBC WITH DIFFERENTIAL/PLATELET
Abs Immature Granulocytes: 0.03 10*3/uL (ref 0.00–0.07)
Basophils Absolute: 0.1 10*3/uL (ref 0.0–0.1)
Basophils Relative: 1 %
Eosinophils Absolute: 0.2 10*3/uL (ref 0.0–0.5)
Eosinophils Relative: 4 %
HCT: 38.9 % (ref 36.0–46.0)
Hemoglobin: 12.9 g/dL (ref 12.0–15.0)
Immature Granulocytes: 1 %
Lymphocytes Relative: 35 %
Lymphs Abs: 1.9 10*3/uL (ref 0.7–4.0)
MCH: 29 pg (ref 26.0–34.0)
MCHC: 33.2 g/dL (ref 30.0–36.0)
MCV: 87.4 fL (ref 80.0–100.0)
Monocytes Absolute: 0.3 10*3/uL (ref 0.1–1.0)
Monocytes Relative: 6 %
Neutro Abs: 2.9 10*3/uL (ref 1.7–7.7)
Neutrophils Relative %: 53 %
Platelets: 336 10*3/uL (ref 150–400)
RBC: 4.45 MIL/uL (ref 3.87–5.11)
RDW: 13.2 % (ref 11.5–15.5)
WBC: 5.4 10*3/uL (ref 4.0–10.5)
nRBC: 0 % (ref 0.0–0.2)

## 2022-06-23 LAB — BASIC METABOLIC PANEL
Anion gap: 7 (ref 5–15)
BUN: 20 mg/dL (ref 6–20)
CO2: 25 mmol/L (ref 22–32)
Calcium: 9.1 mg/dL (ref 8.9–10.3)
Chloride: 104 mmol/L (ref 98–111)
Creatinine, Ser: 0.87 mg/dL (ref 0.44–1.00)
GFR, Estimated: >60 mL/min (ref >60)
Glucose, Bld: 95 mg/dL (ref 70–99)
Potassium: 4 mmol/L (ref 3.5–5.1)
Sodium: 136 mmol/L (ref 135–145)

## 2022-06-23 LAB — RESP PANEL BY RT-PCR (RSV, FLU A&B, COVID)  RVPGX2
Influenza A by PCR: NEGATIVE
Influenza B by PCR: NEGATIVE
Resp Syncytial Virus by PCR: NEGATIVE
SARS Coronavirus 2 by RT PCR: NEGATIVE

## 2022-06-23 MED ORDER — FENTANYL CITRATE PF 50 MCG/ML IJ SOSY
50.0000 ug | PREFILLED_SYRINGE | Freq: Once | INTRAMUSCULAR | Status: AC
  Administered 2022-06-23: 50 ug via INTRAVENOUS
  Filled 2022-06-23: qty 1

## 2022-06-23 MED ORDER — BENZONATATE 100 MG PO CAPS: 100.0000 mg | ORAL_CAPSULE | Freq: Three times a day (TID) | ORAL | 0 refills | Status: DC

## 2022-06-23 MED ORDER — ACETAMINOPHEN 325 MG PO TABS
650.0000 mg | ORAL_TABLET | Freq: Once | ORAL | Status: AC
  Administered 2022-06-23: 650 mg via ORAL
  Filled 2022-06-23: qty 2

## 2022-06-23 MED ORDER — IOHEXOL 350 MG/ML SOLN
100.0000 mL | Freq: Once | INTRAVENOUS | Status: AC | PRN
  Administered 2022-06-23: 100 mL via INTRAVENOUS

## 2022-06-23 MED ORDER — DICLOFENAC SODIUM 1 % EX GEL: 4.0000 g | Freq: Four times a day (QID) | CUTANEOUS | 0 refills | Status: DC

## 2022-06-23 NOTE — ED Triage Notes (Signed)
C/O shortness of breath and right calf pain upon waking this morning, x1 episode of hemoptysis as well. States she has hx of factor II clotting disorder, personal PE hx and is on Eliquis 5 mg BID;

## 2022-06-23 NOTE — ED Provider Notes (Signed)
[place] [facility] Provider Note   CSN: [id number] Arrival date & time: 06/23/22  1304     History  Chief Complaint  Patient presents with   Shortness of Breath    Kim Pham is a 42 y.o. female.  Patient here with shortness of breath.  History of PE currently on Eliquis.  Diagnosed about 7 to 8 weeks ago.  She is concerned maybe for another DVT as she is an episode of shortness of breath, small amount of hemoptysis and back pain.  She has been compliant with her medications but she did have recent surgery few weeks ago and did have a drive to [place] recently.  Denies any weakness or numbness fever or chills.  The history is provided by the patient.       Home Medications Prior to Admission medications   Medication Sig Start Date End Date Taking? Authorizing Provider  acetaminophen (TYLENOL) 500 MG tablet Take 1,000 mg by mouth every 6 (six) hours as needed for moderate pain.    [provider]  APIXABAN Arne Cleveland) VTE STARTER PACK ('10MG'$  AND '5MG'$ ) Take as directed on package: start with two-'5mg'$  tablets twice daily for 7 days. On day 8, switch to one-'5mg'$  tablet twice daily. Patient taking differently: Take 5 mg by mouth 2 (two) times daily. 05/13/22   Lavina Hamman, MD  enoxaparin (LOVENOX) 150 MG/ML injection Inject 1 mL (150 mg total) into the skin daily. 05/31/22   Volanda Napoleon, MD  fluticasone-salmeterol (ADVAIR HFA) [id number] MCG/ACT inhaler Inhale 2 puffs into the lungs 2 (two) times daily. Patient taking differently: Inhale 2 puffs into the lungs daily. 03/17/22   Freddi Starr, MD  gabapentin (NEURONTIN) 300 MG capsule TAKE ONE CAPSULE BY MOUTH AT BEDTIME 11/26/21   Debbrah Alar, NP  levothyroxine (SYNTHROID) 50 MCG tablet Take 1 tablet (50 mcg total) by mouth daily. 05/19/22   Debbrah Alar, NP  liothyronine (CYTOMEL) 5 MCG tablet Take 10 mcg by mouth in the morning.    [provider]   Melatonin 10 MG TABS Take 10 mg by mouth at bedtime as needed (sleep).    [provider]  methocarbamol (ROBAXIN) 500 MG tablet Take 1 tablet (500 mg total) by mouth 3 (three) times daily. Patient taking differently: Take 500 mg by mouth 3 (three) times daily as needed for muscle spasms. 05/13/22   Lavina Hamman, MD  Multiple Vitamin (MULTIVITAMIN WITH MINERALS) TABS tablet Take 1 tablet by mouth daily.    [provider]  ondansetron (ZOFRAN) 4 MG tablet Take 1 tablet (4 mg total) by mouth every 8 (eight) hours as needed for nausea or vomiting. 05/13/22   Lavina Hamman, MD  oxyCODONE (OXY IR/ROXICODONE) 5 MG immediate release tablet Take 1 tablet (5 mg total) by mouth every 6 (six) hours as needed for severe pain. 06/08/22   Stark Klein, MD  oxyCODONE-acetaminophen (PERCOCET/ROXICET) 5-325 MG tablet Take 1 tablet by mouth every 6 (six) hours as needed for severe pain.    [provider]  pantoprazole (PROTONIX) 40 MG tablet Take 40 mg by mouth daily as needed (acid reflux).    [provider]  PARoxetine (PAXIL) 20 MG tablet Take 1 tablet (20 mg total) by mouth at bedtime. 08/13/21   Truett Mainland, DO  promethazine (PHENERGAN) 25 MG tablet Take 1 tablet (25 mg total) by mouth every 8 (eight) hours as needed for nausea or vomiting. 05/18/22   Inda Castle,  Melissa, NP  rosuvastatin (CRESTOR) 20 MG tablet Take 1 tablet (20 mg total) by mouth daily. Patient not taking: Reported on 06/03/2022 12/28/21 12/23/22  Revankar, Reita Cliche, MD  SUMAtriptan (IMITREX) 100 MG tablet Take 1 tablet (100 mg total) by mouth at onset of migraine. May repeat in 2 hours if headache persists or recurs. Do not take more than 2 doses in 24 hours. Patient taking differently: Take 100 mg by mouth as needed (at onset of migraine- may repeat once in 2 hours, if no relief- max of 2 doses/24 hours). 06/29/17   Melvenia Beam, MD  traZODone (DESYREL) 100 MG tablet TAKE TWO TABLETS BY MOUTH EVERY  NIGHT AT BEDTIME 04/13/22   Debbrah Alar, NP      Allergies    Rosuvastatin, Tape, Tramadol, Effexor [venlafaxine], Norco [hydrocodone-acetaminophen], and Voltaren [diclofenac]    Review of Systems   Review of Systems  Physical Exam Updated Vital Signs BP 119/83 (BP Location: Left Arm)   Pulse 95   Temp 97.7 F (36.5 C) (Oral)   Resp 18   Ht '5\' 9"'$  (1.753 m)   Wt 105.5 kg   SpO2 97%   BMI 34.35 kg/m  Physical Exam Vitals and nursing note reviewed.  Constitutional:      General: She is not in acute distress.    Appearance: She is well-developed. She is not ill-appearing.  HENT:     Head: Normocephalic and atraumatic.     Mouth/Throat:     Mouth: Mucous membranes are moist.  Eyes:     Conjunctiva/sclera: Conjunctivae normal.     Pupils: Pupils are equal, round, and reactive to light.  Cardiovascular:     Rate and Rhythm: Normal rate and regular rhythm.     Pulses: Normal pulses.     Heart sounds: Normal heart sounds. No murmur heard. Pulmonary:     Effort: Pulmonary effort is normal. No respiratory distress.     Breath sounds: Normal breath sounds. No decreased breath sounds, wheezing or rhonchi.  Abdominal:     Palpations: Abdomen is soft.     Tenderness: There is no abdominal tenderness.  Musculoskeletal:        General: No swelling. Normal range of motion.     Cervical back: Normal range of motion and neck supple.     Right lower leg: No edema.     Left lower leg: No edema.  Skin:    General: Skin is warm and dry.     Capillary Refill: Capillary refill takes less than 2 seconds.  Neurological:     General: No focal deficit present.     Mental Status: She is alert.  Psychiatric:        Mood and Affect: Mood normal.     ED Results / Procedures / Treatments   Labs (all labs ordered are listed, but only abnormal results are displayed) Labs Reviewed  RESP PANEL BY RT-PCR (RSV, FLU A&B, COVID)  [id number]  CBC WITH DIFFERENTIAL/PLATELET  BASIC METABOLIC  PANEL    EKG EKG Interpretation  Date/Time:  Wednesday June 23 2022 13:17:49 EST Ventricular Rate:  70 PR Interval:  120 QRS Duration: 91 QT Interval:  395 QTC Calculation: 427 R Axis:   64 Text Interpretation: Sinus rhythm Low voltage, precordial leads Confirmed by Lennice Sites ([id number]) on 06/23/2022 1:20:08 PM  Radiology DG Chest Portable 1 View  Result Date: 06/23/2022 CLINICAL DATA:  Shortness of breath EXAM: PORTABLE CHEST 1 VIEW COMPARISON:  X-ray 05/26/2022 FINDINGS: Hyperinflation. Minimal  left basilar atelectasis. No consolidation, pneumothorax or effusion. Normal cardiopericardial silhouette without edema. Overlapping cardiac leads. Truncated appearance of the distal right clavicle. IMPRESSION: Hyperinflation.  Minimal left basilar atelectasis. Electronically Signed   By: Jill Side M.D.   On: 06/23/2022 13:41    Procedures Procedures    Medications Ordered in ED Medications  acetaminophen (TYLENOL) tablet 650 mg (650 mg Oral Given 06/23/22 1427)  fentaNYL (SUBLIMAZE) injection 50 mcg (50 mcg Intravenous Given 06/23/22 1427)    ED Course/ Medical Decision Making/ A&P                             Medical Decision Making Amount and/or Complexity of Data Reviewed Labs: ordered. Radiology: ordered.  Risk OTC drugs. Prescription drug management.   Latima Common Andon is here with shortness of breath.  Normal vitals.  No fever.  History of blood clot on blood thinners.  She had some cough and episode hemoptysis this morning concerned she might have a blood clot again.  She denies any abdominal pain nausea vomiting diarrhea.  She has clear breath sounds.  No signs of respiratory distress.  Normal vitals.  She has been compliant with her blood thinner except for when she had to have surgery to her gallbladder recently.  She has been on blood thinners for about 2 months for blood clot that was diagnosed back in January.  Differential diagnosis likely bronchitis/viral process but  will evaluate for PE, pneumonia with CT scan.  Will get basic labs.  Will get COVID and flu test.  EKG shows sinus rhythm.  No ischemic changes.  Per my review interpretation of chest x-ray there is no obvious pneumonia or pneumothorax.  CBC shows no significant anemia or leukocytosis per my review and interpretation.  Patient pending remaining labs and CT scan and handoff to oncoming ED staff.  This chart was dictated using voice recognition software.  Despite best efforts to proofread,  errors can occur which can change the documentation meaning.         Final Clinical Impression(s) / ED Diagnoses Final diagnoses:  Shortness of breath    Rx / DC Orders ED Discharge Orders     None         Lennice Sites, DO 06/23/22 1438

## 2022-06-23 NOTE — Discharge Instructions (Addendum)
Use the gel as prescribed Also take tylenol '1000mg'$ (2 extra strength) four times a day.   Follow up with your family doc in the office.  Please return for worsening pain or if you pass out.

## 2022-06-23 NOTE — ED Provider Notes (Signed)
Received patient in turnover from Dr. Ronnald Nian.  Please see their note for further details of Hx, PE.  Briefly patient is a 42 y.o. female with a Shortness of Breath .  Patient with a history of a subsegmental PE, felt like this felt similar and had missed a dose of her Eliquis for a planned procedure.  Workup thus far unremarkable.  Plan for CT scan of the chest.  This is resulted and is negative for pulmonary embolism or occult pneumonia.  I discussed results with the patient.  Will treat as possibly musculoskeletal versus viral syndrome with cough.  Will have her follow-up with her family doctor in the office.Tyrone Nine, Linna Hoff, DO 06/23/22 1534

## 2022-06-25 ENCOUNTER — Other Ambulatory Visit (HOSPITAL_COMMUNITY): Payer: Self-pay

## 2022-06-25 ENCOUNTER — Ambulatory Visit (INDEPENDENT_AMBULATORY_CARE_PROVIDER_SITE_OTHER): Payer: BC Managed Care – PPO | Admitting: Family

## 2022-06-25 VITALS — BP 112/67 | HR 74 | Temp 98.7°F | Resp 16 | Wt 234.0 lb

## 2022-06-25 DIAGNOSIS — E782 Mixed hyperlipidemia: Secondary | ICD-10-CM | POA: Diagnosis not present

## 2022-06-25 DIAGNOSIS — F1010 Alcohol abuse, uncomplicated: Secondary | ICD-10-CM | POA: Insufficient documentation

## 2022-06-25 DIAGNOSIS — E66811 Obesity, class 1: Secondary | ICD-10-CM

## 2022-06-25 DIAGNOSIS — N9489 Other specified conditions associated with female genital organs and menstrual cycle: Secondary | ICD-10-CM | POA: Diagnosis not present

## 2022-06-25 DIAGNOSIS — I2699 Other pulmonary embolism without acute cor pulmonale: Secondary | ICD-10-CM

## 2022-06-25 DIAGNOSIS — F419 Anxiety disorder, unspecified: Secondary | ICD-10-CM | POA: Diagnosis not present

## 2022-06-25 DIAGNOSIS — K819 Cholecystitis, unspecified: Secondary | ICD-10-CM

## 2022-06-25 DIAGNOSIS — E669 Obesity, unspecified: Secondary | ICD-10-CM

## 2022-06-25 DIAGNOSIS — K219 Gastro-esophageal reflux disease without esophagitis: Secondary | ICD-10-CM

## 2022-06-25 DIAGNOSIS — M329 Systemic lupus erythematosus, unspecified: Secondary | ICD-10-CM

## 2022-06-25 DIAGNOSIS — E038 Other specified hypothyroidism: Secondary | ICD-10-CM

## 2022-06-25 LAB — LIPID PANEL
Cholesterol: 272 mg/dL — ABNORMAL HIGH (ref 0–200)
HDL: 98.8 mg/dL (ref >39.00)
LDL Cholesterol: 157 mg/dL — ABNORMAL HIGH (ref 0–99)
NonHDL: 173.03
Total CHOL/HDL Ratio: 3
Triglycerides: 82 mg/dL (ref 0.0–149.0)
VLDL: 16.4 mg/dL (ref 0.0–40.0)

## 2022-06-25 MED ORDER — ZEPBOUND 2.5 MG/0.5ML ~~LOC~~ SOAJ
2.5000 mg | SUBCUTANEOUS | 0 refills | Status: DC
  Filled 2022-06-25 – 2022-07-05 (×4): qty 2, 28d supply, fill #0

## 2022-06-25 MED ORDER — SERTRALINE HCL 50 MG PO TABS: 50.0000 mg | ORAL_TABLET | Freq: Every day | ORAL | 1 refills | Status: DC

## 2022-06-25 NOTE — Assessment & Plan Note (Signed)
Lab Results  Component Value Date   TSH 5.343 (H) 05/12/2022   She will follow up with functional MD who is managing her thyroid medications.

## 2022-06-25 NOTE — Assessment & Plan Note (Signed)
Pain resolved following cholecystectomy.

## 2022-06-25 NOTE — Assessment & Plan Note (Signed)
Likely exacerbated by worsening anxiety symptoms.  Refer to behavioral Health.

## 2022-06-25 NOTE — Assessment & Plan Note (Signed)
No recent migraines.  

## 2022-06-25 NOTE — Assessment & Plan Note (Signed)
Will see if we can get Zepbound covered for her.  Denies family hx of thyroid cancer.

## 2022-06-25 NOTE — Assessment & Plan Note (Signed)
Clinically stable. She will schedule follow up with Dr. Estanislado Pandy.

## 2022-06-25 NOTE — Assessment & Plan Note (Signed)
Maintained on Eliquis.  Follow up CT chest negative for PE. Continue Eliquis.

## 2022-06-25 NOTE — Progress Notes (Signed)
Subjective:   By signing my name below, I, Madelin Rear, attest that this documentation has been prepared under the direction and in the presence of Debbrah Alar, NP.  06/25/2022.    Patient ID: Kim Pham, female    DOB: 1980/10/20, 42 y.o.   MRN: [id number]  Chief Complaint  Patient presents with   Anxiety    Reports increased anxiety symptoms    HPI Patient is in today for an office visit.  RUQ pain: She reports her pain has resolved. She has returned to work and follows restrictions such as not lifting more than 25 lbs.  Anxiety:  She is struggling with anxiety. She is concerned about her health. For a time she had returned to drinking alcohol to quiet her brain. At this time she has been sober for 2 days. She has tried to contact behavioral health but has not heard back from them. Currently taking 20 mg Paxil once daily.  Acid reflux:  Her reflux symptoms are improving. She is treating this with 40 mg pantoprazole.  Hot Flashes:  Severity and frequency will vary depending on the day.  Joint Pain:  She notes that she needs to follow-up with rheumatology. Her joint pain varies. Currently she states the pain is okay.  Migraines:  She denies any recent migraines.  Weight loss:  She is frustrated with her weight. For exercise she has restarted her walking routines.  Wt Readings from Last 3 Encounters:  06/25/22 234 lb (106.1 kg)  06/23/22 232 lb 9.4 oz (105.5 kg)  06/08/22 232 lb 9.4 oz (105.5 kg)   Hematology:  She has seen hematology and is going to remain on Eliquis for now. She denies hematochezia.  Immunizations:  Our records show her last Tdap vaccination was 10/18/2011. She endorses receiving a more recent Tdap vaccine via [facility].  Thyroid:  She confirms a personal history of thyroid nodule. She denies any family history of thyroid cancer.  Denies having any fever, new muscle pain, new moles, congestion, sore throat, chest pain, palpitations, cough,  SOB, wheezing, n/v/d, constipation, blood in stool, dysuria, frequency, hematuria, at this time.  Past Medical History:  Diagnosis Date   Acute pulmonary embolism without acute cor pulmonale ([place]) 05/17/2022   Adnexal mass 02/24/2021   in epic seen on [place]   Amenorrhea 08/14/2021   Anxiety 08/14/2021   Arthralgia 08/18/2016   Back pain 09/04/2021   Blood in stool 09/08/2021   Cardiac murmur 12/16/2021   Chest discomfort 04/20/2022   Cholecystitis 05/26/2022   Chondromalacia, patella 06/30/2021   Chronic calculous cholecystitis 06/08/2022   Chronic constipation 08/14/2021   Chronic headaches HISTORY OF MIGRAINES 08/14/2021   Chronic migraine without aura, with intractable migraine, so stated, with status migrainosus [id number]   Complication of anesthesia 08/14/2021   PONV   Constipation 06/11/2021   Coronary artery calcification seen on CT scan 12/16/2021   Cyst of left ovary 06/17/2021   Degenerative disc disease, lumbar 09/04/2019   Depression with anxiety 03/26/2016   Diarrhea 07/08/2017   DOE (dyspnea on exertion) 04/20/2022   Effusion, left knee 12/16/2021   Endometriosis    Family history of BRCA gene mutation 06/17/2021   [facility] genetic testing done 06/2021 - negative for breast cancer genes.   Family history of breast cancer 09/09/2015   Formatting of this note might be different from the original.  Mom dx'd age 54  Mat aunt dx'd age 72  Neither relative tested for BRCA  Formatting of this  note might be different from the original.  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 ([place])    High risk medication use 11/22/2016   history of Chiari malformation    History of Chiari malformation 11/22/2016   history of COVID 02/2021   LOW GRADE FEVER CONGESTION AND HEADACHE ALL SYMPTOMS  RESOLVED   Hot flashes 06/11/2021   Hyperlipidemia    Insomnia 08/19/2020   Low blood pressure    pt states history of low blood pressure-fainted 12/2010-instructed to move slowly   Nipple discharge 05/19/2022   Nonepileptic episode ([place]) 12/22/2016   Numbness and tingling 02/25/2016   Obesity (BMI 30.0-34.9) 12/16/2021   On continuous oral anticoagulation 05/17/2022   OSTEOARTHRITIS 08/14/2021   Other epilepsy, intractable, without status epilepticus ([place]) 11/01/2016   Pain in right knee 11/13/2020   Pain in unspecified joint 08/18/2016   Palpitations 12/15/2021   Pelvic pain in female 08/18/2021   Photosensitivity 11/22/2016   PONV (postoperative nausea and vomiting)    Preventative health care 09/08/2021   Psychogenic nonepileptic seizure 07/27/2017   Pulmonary embolism ([place])    Raynaud's disease without gangrene 11/22/2016   Rectal bleeding 06/11/2021   RUQ pain 05/07/2022   Sacroiliac joint dysfunction of right side 12/19/2019   Sicca syndrome ([place]) 11/22/2016   SLE (systemic lupus erythematosus) ([place]) 03/26/2016   Status post laparoscopic cholecystectomy 06/08/2022   Subclinical hypothyroidism    Thyroid nodule    Transient alteration of awareness 01/01/2016   Vasomotor symptoms due to menopause 06/17/2021   Voice hoarseness 05/07/2022   Wears glasses for reading 08/14/2021    Past Surgical History:  Procedure Laterality Date   ABDOMINAL HYSTERECTOMY  2007   CHOLECYSTECTOMY N/A 06/08/2022   Procedure: LAPAROSCOPIC CHOLECYSTECTOMY WITH ICG;  Surgeon: Stark Klein, MD;  Location: [facility];  Service: General;  Laterality: N/A;   CYSTOSCOPY  2007   WITH HYSTERECTOMY   DIAGNOSTIC LAPAROSCOPY  2005   OOPHORECTOMY Left 08/18/2021   ROTATOR CUFF REPAIR Right 2010   SALPINGOOPHORECTOMY  04/16/2011   Procedure: SALPINGO OOPHERECTOMY;  Surgeon: Lovenia Kim, MD;  Location: [facility];  Service: Gynecology;  Laterality: Right;   skull decompression surgery 2005     @ [place] History  Problem Relation Age of Onset   Hypertension Mother    CAD Mother    Breast cancer Mother    Migraines Mother    Gout Mother    Heart disease Mother    Crohn's disease Mother    Hypertension Father    CAD Father    CVA Father    Heart disease Father    Colon polyps Father    Liver disease Father    Sjogren's syndrome Sister    Celiac disease Sister    Rheum arthritis Maternal Grandmother    Dementia Maternal Grandmother    Pancreatic cancer Maternal Grandfather    CAD Paternal Grandmother    Macular degeneration Paternal Grandmother    CVA Paternal Grandfather    Autism Son    ADD / ADHD Son    Breast cancer Maternal Aunt    Esophageal cancer Neg Hx    Colon cancer Neg Hx    Stomach cancer Neg Hx     Social History   Socioeconomic History  Marital status: Married    Spouse name: Not on file   Number of children: 1   Years of education: Assoc   Highest education level: Not on file  Occupational History   Occupation: nurse  Tobacco Use   Smoking status: Former    Packs/day: 1.00    Years: 10.00    Total pack years: 10.00    Types: Cigarettes    Quit date: 04/04/2004    Years since quitting: 18.2    Passive exposure: Past   Smokeless tobacco: Never  Vaping Use   Vaping Use: Never used  Substance and Sexual Activity   Alcohol use: Not Currently   Drug use: Never   Sexual activity: Yes    Birth control/protection: Surgical  Other Topics Concern   Not on file  Social History Narrative   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   Married   1 son- born 2004 has autism   Enjoys exercise, special olympics with son   Drinks 4-5 cups of coffee a day       Right handed   Social Determinants of Health   Financial Resource Strain: Not on file  Food Insecurity: No Food Insecurity (06/08/2022)   Hunger Vital Sign    Worried About Running Out of Food in the Last Year: Never true    [place] in the Last Year: Never true  Transportation  Needs: No Transportation Needs (06/08/2022)   PRAPARE - Hydrologist (Medical): No    Lack of Transportation (Non-Medical): No  Physical Activity: Not on file  Stress: Not on file  Social Connections: Not on file  Intimate Partner Violence: Not At Risk (06/08/2022)   Humiliation, Afraid, Rape, and Kick questionnaire    Fear of Current or Ex-Partner: No    Emotionally Abused: No    Physically Abused: No    Sexually Abused: No    Outpatient Medications Prior to Visit  Medication Sig Dispense Refill   acetaminophen (TYLENOL) 500 MG tablet Take 1,000 mg by mouth every 6 (six) hours as needed for moderate pain.     APIXABAN (ELIQUIS) VTE STARTER PACK ('10MG'$  AND '5MG'$ ) Take as directed on package: start with two-'5mg'$  tablets twice daily for 7 days. On day 8, switch to one-'5mg'$  tablet twice daily. (Patient taking differently: Take 5 mg by mouth 2 (two) times daily.) 74 each 0   fluticasone-salmeterol (ADVAIR HFA) 115-21 MCG/ACT inhaler Inhale 2 puffs into the lungs 2 (two) times daily. (Patient taking differently: Inhale 2 puffs into the lungs daily.) 1 each 12   gabapentin (NEURONTIN) 300 MG capsule TAKE ONE CAPSULE BY MOUTH AT BEDTIME 90 capsule 1   levothyroxine (SYNTHROID) 50 MCG tablet Take 1 tablet (50 mcg total) by mouth daily. 90 tablet 1   liothyronine (CYTOMEL) 5 MCG tablet Take 10 mcg by mouth in the morning.     Melatonin 10 MG TABS Take 10 mg by mouth at bedtime as needed (sleep).     methocarbamol (ROBAXIN) 500 MG tablet Take 1 tablet (500 mg total) by mouth 3 (three) times daily. (Patient taking differently: Take 500 mg by mouth 3 (three) times daily as needed for muscle spasms.) 30 tablet 0   Multiple Vitamin (MULTIVITAMIN WITH MINERALS) TABS tablet Take 1 tablet by mouth daily.     pantoprazole (PROTONIX) 40 MG tablet Take 40 mg by mouth daily as needed (acid reflux).     promethazine (PHENERGAN) 25 MG tablet Take 1 tablet (25  mg total) by mouth every 8  (eight) hours as needed for nausea or vomiting. 30 tablet 1   rosuvastatin (CRESTOR) 20 MG tablet Take 1 tablet (20 mg total) by mouth daily. 90 tablet 3   SUMAtriptan (IMITREX) 100 MG tablet Take 1 tablet (100 mg total) by mouth at onset of migraine. May repeat in 2 hours if headache persists or recurs. Do not take more than 2 doses in 24 hours. (Patient taking differently: Take 100 mg by mouth as needed (at onset of migraine- may repeat once in 2 hours, if no relief- max of 2 doses/24 hours).) 10 tablet 2   traZODone (DESYREL) 100 MG tablet TAKE TWO TABLETS BY MOUTH EVERY NIGHT AT BEDTIME 180 tablet 1   PARoxetine (PAXIL) 20 MG tablet Take 1 tablet (20 mg total) by mouth at bedtime. 90 tablet 3   benzonatate (TESSALON) 100 MG capsule Take 1 capsule (100 mg total) by mouth every 8 (eight) hours. 21 capsule 0   diclofenac Sodium (VOLTAREN) 1 % GEL Apply 4 g topically 4 (four) times daily. 100 g 0   enoxaparin (LOVENOX) 150 MG/ML injection Inject 1 mL (150 mg total) into the skin daily. 5 mL 0   ondansetron (ZOFRAN) 4 MG tablet Take 1 tablet (4 mg total) by mouth every 8 (eight) hours as needed for nausea or vomiting. 20 tablet 0   oxyCODONE (OXY IR/ROXICODONE) 5 MG immediate release tablet Take 1 tablet (5 mg total) by mouth every 6 (six) hours as needed for severe pain. 15 tablet 0   oxyCODONE-acetaminophen (PERCOCET/ROXICET) 5-325 MG tablet Take 1 tablet by mouth every 6 (six) hours as needed for severe pain.     No facility-administered medications prior to visit.    Allergies  Allergen Reactions   Rosuvastatin Nausea Only   Tape Itching and Other (See Comments)    Skin itches if tape is left on for awhile   Tramadol Nausea And Vomiting   Effexor [Venlafaxine] Nausea Only and Rash   Norco [Hydrocodone-Acetaminophen] Rash   Voltaren [Diclofenac] Rash    Review of Systems  Constitutional:  Negative for fever.  HENT:  Negative for congestion, sinus pain and sore throat.   Respiratory:   Negative for cough, shortness of breath and wheezing.   Cardiovascular:  Negative for chest pain and palpitations.  Gastrointestinal:  Negative for blood in stool, constipation, diarrhea, nausea and vomiting.  Genitourinary:  Negative for dysuria, frequency and hematuria.  Musculoskeletal:  Negative for myalgias.       Objective:    Physical Exam Constitutional:      Appearance: Normal appearance.  HENT:     Head: Normocephalic and atraumatic.     Right Ear: Tympanic membrane, ear canal and external ear normal.     Left Ear: Tympanic membrane, ear canal and external ear normal.  Eyes:     Extraocular Movements: Extraocular movements intact.     Pupils: Pupils are equal, round, and reactive to light.  Cardiovascular:     Rate and Rhythm: Normal rate and regular rhythm.     Heart sounds: Normal heart sounds. No murmur heard.    No gallop.  Pulmonary:     Effort: Pulmonary effort is normal. No respiratory distress.     Breath sounds: Normal breath sounds. No wheezing or rales.  Abdominal:     Comments: Laparascopic incisions clean, dry, and intact.  Skin:    General: Skin is warm and dry.  Neurological:     General: No focal  deficit present.     Mental Status: She is alert and oriented to person, place, and time.  Psychiatric:        Mood and Affect: Mood normal.        Behavior: Behavior normal.     BP 112/67 (BP Location: Right Arm, Patient Position: Sitting, Cuff Size: Large)   Pulse 74   Temp 98.7 F (37.1 C) (Oral)   Resp 16   Wt 234 lb (106.1 kg)   SpO2 98%   BMI 34.56 kg/m  Wt Readings from Last 3 Encounters:  06/25/22 234 lb (106.1 kg)  06/23/22 232 lb 9.4 oz (105.5 kg)  06/08/22 232 lb 9.4 oz (105.5 kg)      Assessment & Plan:   Problem List Items Addressed This Visit       Unprioritized   Subclinical hypothyroidism    Lab Results  Component Value Date   TSH 5.343 (H) 05/12/2022  She will follow up with functional MD who is managing her thyroid  medications.       SLE (systemic lupus erythematosus) (HCC)    Clinically stable. She will schedule follow up with Dr. Estanislado Pandy.       Obesity (BMI 30.0-34.9)    Will see if we can get Zepbound covered for her.  Denies family hx of thyroid cancer.       Hyperlipidemia    Lab Results  Component Value Date   CHOL 273 (H) 09/08/2021   HDL 79.90 09/08/2021   LDLCALC 176 (H) 09/08/2021   TRIG 87.0 09/08/2021   CHOLHDL 3 09/08/2021        Relevant Orders   Lipid panel   Gastroesophageal reflux disease    Improved on pantoprazole. Continue same.       Cholecystitis    Pain resolved following cholecystectomy.      Anxiety - Primary    Uncontrolled. D/c paxil, start sertraline refer to behavioral health.  She is concerned about weight gain.  Hopefully she will have less weight gain on sertraline.  Wt Readings from Last 3 Encounters:  06/25/22 234 lb (106.1 kg)  06/23/22 232 lb 9.4 oz (105.5 kg)  06/08/22 232 lb 9.4 oz (105.5 kg)        Relevant Medications   sertraline (ZOLOFT) 50 MG tablet   Other Relevant Orders   Ambulatory referral to [facility]   Alcohol abuse    Likely exacerbated by worsening anxiety symptoms.  Refer to behavioral Health.      Adnexal mass    Normal CT 05/11/22.       Acute pulmonary embolism without acute cor pulmonale (HCC)    Maintained on Eliquis.  Follow up CT chest negative for PE. Continue Eliquis.         Meds ordered this encounter  Medications   sertraline (ZOLOFT) 50 MG tablet    Sig: Take 1 tablet (50 mg total) by mouth daily.    Dispense:  30 tablet    Refill:  1    Order Specific Question:   Supervising Provider    Answer:   Penni Homans A [[id number]]   tirzepatide (ZEPBOUND) 2.5 MG/0.5ML Pen    Sig: Inject 2.5 mg into the skin once a week.    Dispense:  2 mL    Refill:  0    Order Specific Question:   Supervising Provider    Answer:   Penni Homans A [[id number]]    I, Nance Pear, NP, personally preformed  the services  described in this documentation.  All medical record entries made by the scribe were at my direction and in my presence.  I have reviewed the chart and discharge instructions (if applicable) and agree that the record reflects my personal performance and is accurate and complete. 06/25/2022.  I,Mathew Stumpf,acting as a Education administrator for [employer], NP.,have documented all relevant documentation on the behalf of Nance Pear, NP,as directed by  Nance Pear, NP while in the presence of Nance Pear, NP.   Nance Pear, NP

## 2022-06-25 NOTE — Assessment & Plan Note (Signed)
Normal CT 05/11/22.

## 2022-06-25 NOTE — Assessment & Plan Note (Signed)
Improved on pantoprazole. Continue same.

## 2022-06-25 NOTE — Assessment & Plan Note (Addendum)
Uncontrolled. D/c paxil, start sertraline refer to behavioral health.  She is concerned about weight gain.  Hopefully she will have less weight gain on sertraline.  Wt Readings from Last 3 Encounters:  06/25/22 234 lb (106.1 kg)  06/23/22 232 lb 9.4 oz (105.5 kg)  06/08/22 232 lb 9.4 oz (105.5 kg)

## 2022-06-25 NOTE — Assessment & Plan Note (Signed)
Lab Results  Component Value Date   CHOL 273 (H) 09/08/2021   HDL 79.90 09/08/2021   LDLCALC 176 (H) 09/08/2021   TRIG 87.0 09/08/2021   CHOLHDL 3 09/08/2021

## 2022-06-28 ENCOUNTER — Encounter: Payer: Self-pay | Admitting: Family

## 2022-06-28 ENCOUNTER — Other Ambulatory Visit: Payer: Self-pay

## 2022-06-28 ENCOUNTER — Encounter: Payer: Self-pay | Admitting: Hematology & Oncology

## 2022-06-28 ENCOUNTER — Other Ambulatory Visit (HOSPITAL_COMMUNITY): Payer: Self-pay

## 2022-06-28 DIAGNOSIS — I2609 Other pulmonary embolism with acute cor pulmonale: Secondary | ICD-10-CM

## 2022-06-28 MED ORDER — APIXABAN 5 MG PO TABS
5.0000 mg | ORAL_TABLET | Freq: Two times a day (BID) | ORAL | 2 refills | Status: DC
  Filled 2022-06-28: qty 60, 30d supply, fill #0
  Filled 2022-07-23: qty 60, 30d supply, fill #1
  Filled 2022-08-22: qty 60, 30d supply, fill #2

## 2022-06-29 ENCOUNTER — Ambulatory Visit: Payer: BC Managed Care – PPO | Admitting: Family

## 2022-06-30 ENCOUNTER — Telehealth (HOSPITAL_BASED_OUTPATIENT_CLINIC_OR_DEPARTMENT_OTHER): Payer: Self-pay

## 2022-07-02 ENCOUNTER — Ambulatory Visit (INDEPENDENT_AMBULATORY_CARE_PROVIDER_SITE_OTHER): Payer: BC Managed Care – PPO | Admitting: Behavioral Health

## 2022-07-02 DIAGNOSIS — F411 Generalized anxiety disorder: Secondary | ICD-10-CM

## 2022-07-02 DIAGNOSIS — F331 Major depressive disorder, recurrent, moderate: Secondary | ICD-10-CM | POA: Diagnosis not present

## 2022-07-02 NOTE — Progress Notes (Signed)
                Elzabeth Mcquerry M Brittnye Josephs, LCMHC 
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
## 2022-07-04 ENCOUNTER — Encounter: Payer: Self-pay | Admitting: Family

## 2022-07-05 ENCOUNTER — Other Ambulatory Visit (HOSPITAL_COMMUNITY): Payer: Self-pay

## 2022-07-07 ENCOUNTER — Telehealth: Payer: Self-pay

## 2022-07-07 ENCOUNTER — Encounter: Payer: Self-pay | Admitting: Family

## 2022-07-07 ENCOUNTER — Other Ambulatory Visit (HOSPITAL_COMMUNITY): Payer: Self-pay

## 2022-07-07 DIAGNOSIS — F1090 Alcohol use, unspecified, uncomplicated: Secondary | ICD-10-CM

## 2022-07-07 NOTE — Telephone Encounter (Signed)
Pharmacy Patient Advocate Encounter   Received notification that prior authorization for Zepbound 2.5mg /0.62ml is required/requested.  Per Test Claim: Product/service not covered - plan/benefit exclusion. PA required   PA submitted on 07/07/22 to (ins) [employer] via fax at [phone]  Status is pending

## 2022-07-08 ENCOUNTER — Encounter: Payer: Self-pay | Admitting: Family

## 2022-07-08 MED ORDER — NALTREXONE HCL 50 MG PO TABS: ORAL_TABLET | ORAL | 0 refills | Status: DC

## 2022-07-14 NOTE — Telephone Encounter (Signed)
Placed a call to [facility] regarding Prior Authorization for Zepbound 2.5mg /0.58ml.  Authorization has been DENIED because the patient does not have weight loss benefits with her plan.  Phone# [phone]

## 2022-07-15 ENCOUNTER — Ambulatory Visit (INDEPENDENT_AMBULATORY_CARE_PROVIDER_SITE_OTHER): Payer: BC Managed Care – PPO | Admitting: Behavioral Health

## 2022-07-15 ENCOUNTER — Encounter: Payer: Self-pay | Admitting: Behavioral Health

## 2022-07-15 DIAGNOSIS — F411 Generalized anxiety disorder: Secondary | ICD-10-CM

## 2022-07-15 NOTE — Progress Notes (Addendum)
Kim Pham Counselor/Therapist Progress Note  Patient ID: Kim Pham, MRN: [id number],    Date: 07/15/2022  Time Spent: 25 minutes spent via video session.  The patient was in her car and this therapist was at his home office.  Treatment Type: Individual Therapy  Reported Symptoms: Anxiety/stress  Mental Status Exam: Appearance:  Well Groomed     Behavior: Appropriate  Motor: Normal  Speech/Language:  Clear and Coherent  Affect: Appropriate  Mood: normal  Thought process: normal  Thought content:   WNL  Sensory/Perceptual disturbances:   WNL  Orientation: oriented to person, place, time/date, situation, day of week, and month of year  Attention: Good  Concentration: Good  Memory: WNL  Fund of knowledge:  Good  Insight:   Good  Judgment:  Good  Impulse Control: Good   Risk Assessment: Danger to Self:  No Self-injurious Behavior: No Danger to Others: No Duty to Warn:no Physical Aggression / Violence:No  Access to Firearms a concern: No  Gang Involvement:No   Subjective: I reviewed the treatment plan with the patient based on our last video session.  She reports an improvement in most of the things that we talked about in the previous session.  Physically she is feeling better.  She has had nothing to drink in the past month although she acknowledges there have been times where she has been tempted she is putting good coping skills in place.  Her husband is a Camera operator partner.  As the weather has gotten nicer she is going outside more taking walks between 30 minutes and an hour as well as working in her yard.  She recognizes the importance of keeping her mind her hands busy.  She still is helping the young man who is a friend of her sons get started in life with education, career etc.  She does not put a timetable on his time in the home but says that she wants to get him as solid foundation as he can so that he can live independently eventually.   She is enjoying the emptiness syndrome as her son is in college.  He is home for the week and she is enjoying him now and he appears to be doing well in school.  She acknowledges that days like today are somewhat stressful and can be triggering.  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  She has found ways to mentally emotionally decompensated and separate herself from that.  There is a possibility that she would be doing more of a marketing administrative role moving forward in promoting hospice but would still have some contact with families.  She feels that could be good for her at this stage of her time with hospice.  She has been using the grounding exercise that we talked about in the previous session I emphasized the importance of relaxation breathing as well as introduced the vagus nerve stimulation.  We will look at some mindfulness exercises in the next session 2 weeks for now.  Interventions: Cognitive Behavioral Therapy  Diagnosis: Generalized anxiety disorder  Plan: I will meet with the patient every 2 weeks virtually Target date: January 17, 2023 Progress: 25%  Sabas Sous, [facility]

## 2022-07-15 NOTE — Progress Notes (Signed)
                Hughey Rittenberry M Guida Asman, LCMHC 

## 2022-07-15 NOTE — Telephone Encounter (Signed)
Pt informed

## 2022-07-26 ENCOUNTER — Other Ambulatory Visit (HOSPITAL_COMMUNITY): Payer: Self-pay

## 2022-07-26 NOTE — Progress Notes (Signed)
Subjective:   By signing my name below, I, Isabelle CourseRachel Rivera, attest that this documentation has been prepared under the direction and in the presence of Lemont FillersMelissa S O'Sullivan, NP 07/27/22   Patient ID: Kim Pham, female    DOB: [birth year], 42 y.o.   MRN: [id number]  Chief Complaint  Patient presents with   Anxiety    Here for follow u   Back Pain    Complains of lower back  pain    HPI Patient is in today for a 1 month follow up.   Weight loss: She reports having lost about 20 lbs since her last visit. She has been eating well and staying active by walking daily. She notes changes to her appetite after her cholecystectomy in 05/2022. She still avoids alcohol consumption. She has not been able to start Zepbound yet. She stopped Naltrexone due to headaches, even while taking half a tablet.  Wt Readings from Last 3 Encounters:  07/27/22 214 lb (97.1 kg)  06/25/22 234 lb (106.1 kg)  06/23/22 232 lb 9.4 oz (105.5 kg)   Mood: Her mood is overall well. She stopped Paxil and started Zoloft. She states Zoloft has been helping. She is going to therapy twice a month.   Back pain: She endorses lower back pain. She notes it at times radiates down her legs.   Hot flashes: She has no new concerns. She is not currently taking Gabapentin.   GERD: Her heartburn is well managed. She has no new concerns.  Migraines: She reports her migraines tend to be 3-4 months apart. She takes Imitrex as needed.  Sleep: She is complaint with 200 mg Trazodone at bedtime. She reports it has been helping.   Past Medical History:  Diagnosis Date   Acute pulmonary embolism without acute cor pulmonale (HCC) 05/17/2022   Adnexal mass 02/24/2021   in epic seen on us   Amenorrhea 08/14/2021   Anxiety 08/14/2021   Arthralgia 08/18/2016   Back pain 09/04/2021   Blood in stool 09/08/2021   Cardiac murmur 12/16/2021   Chest discomfort 04/20/2022   Cholecystitis 05/26/2022   Chondromalacia, patella  06/30/2021   Chronic calculous cholecystitis 06/08/2022   Chronic constipation 08/14/2021   Chronic headaches HISTORY OF MIGRAINES 08/14/2021   Chronic migraine without aura, with intractable migraine, so stated, with status migrainosus 07/27/2017   Complication of anesthesia 08/14/2021   PONV   Constipation 06/11/2021   Coronary artery calcification seen on CT scan 12/16/2021   Cyst of left ovary 06/17/2021   Degenerative disc disease, lumbar 09/04/2019   Depression with anxiety 03/26/2016   Diarrhea 07/08/2017   DOE (dyspnea on exertion) 04/20/2022   Effusion, left knee 12/16/2021   Endometriosis    Family history of BRCA gene mutation 06/17/2021   [facility] genetic testing done 06/2021 - negative for breast cancer genes.   Family history of breast cancer 09/09/2015   Formatting of this note might be different from the original.  Mom dx'd age 43  Mat aunt dx'd age 43  Neither relative tested for BRCA  Formatting of this note might be different from the original.  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 (HCC)    High risk medication use 11/22/2016   history of Chiari malformation    History of Chiari  malformation 11/22/2016   history of COVID 02/2021   LOW GRADE FEVER CONGESTION AND HEADACHE ALL SYMPTOMS RESOLVED   Hot flashes 06/11/2021   Hyperlipidemia    Insomnia 08/19/2020   Low blood pressure    pt states history of low blood pressure-fainted 12/2010-instructed to move slowly   Nipple discharge 05/19/2022   Nonepileptic episode (HCC) 12/22/2016   Numbness and tingling 02/25/2016   Obesity (BMI 30.0-34.9) 12/16/2021   On continuous oral anticoagulation 05/17/2022   OSTEOARTHRITIS 08/14/2021   Other epilepsy, intractable, without status epilepticus (HCC) 11/01/2016   Pain in right  knee 11/13/2020   Pain in unspecified joint 08/18/2016   Palpitations 12/15/2021   Pelvic pain in female 08/18/2021   Photosensitivity 11/22/2016   PONV (postoperative nausea and vomiting)    Preventative health care 09/08/2021   Psychogenic nonepileptic seizure 07/27/2017   Pulmonary embolism (HCC)    Raynaud's disease without gangrene 11/22/2016   Rectal bleeding 06/11/2021   RUQ pain 05/07/2022   Sacroiliac joint dysfunction of right side 12/19/2019   Sicca syndrome (HCC) 11/22/2016   SLE (systemic lupus erythematosus) (HCC) 03/26/2016   Status post laparoscopic cholecystectomy 06/08/2022   Subclinical hypothyroidism    Thyroid nodule    Transient alteration of awareness 01/01/2016   Vasomotor symptoms due to menopause 06/17/2021   Voice hoarseness 05/07/2022   Wears glasses for reading 08/14/2021    Past Surgical History:  Procedure Laterality Date   ABDOMINAL HYSTERECTOMY  2007   CHOLECYSTECTOMY N/A 06/08/2022   Procedure: LAPAROSCOPIC CHOLECYSTECTOMY WITH ICG;  Surgeon: Almond Lint, MD;  Location: [facility];  Service: General;  Laterality: N/A;   CYSTOSCOPY  2007   WITH HYSTERECTOMY   DIAGNOSTIC LAPAROSCOPY  2005   OOPHORECTOMY Left 08/18/2021   ROTATOR CUFF REPAIR Right 2010   SALPINGOOPHORECTOMY  04/16/2011   Procedure: SALPINGO OOPHERECTOMY;  Surgeon: Lenoard Aden, MD;  Location: [facility];  Service: Gynecology;  Laterality: Right;   skull decompression surgery 2005     @ [facility]    Family History  Problem Relation Age of Onset   Hypertension Mother    CAD Mother    Breast cancer Mother    Migraines Mother    Gout Mother    Heart disease Mother    Crohn's disease Mother    Hypertension Father    CAD Father    CVA Father    Heart disease Father    Colon polyps Father    Liver disease Father    Sjogren's syndrome Sister    Celiac disease Sister    Rheum arthritis Maternal Grandmother    Dementia Maternal Grandmother    Pancreatic cancer Maternal  Grandfather    CAD Paternal Grandmother    Macular degeneration Paternal Grandmother    CVA Paternal Grandfather    Autism Son    ADD / ADHD Son    Breast cancer Maternal Aunt    Esophageal cancer Neg Hx    Colon cancer Neg Hx    Stomach cancer Neg Hx     Social History   Socioeconomic History   Marital status: Married    Spouse name: Not on file   Number of children: 1   Years of education: Assoc   Highest education level: Not on file  Occupational History   Occupation: nurse  Tobacco Use   Smoking status: Former    Packs/day: 1.00    Years: 10.00    Additional pack years: 0.00    Total pack years: 10.00  Types: Cigarettes    Quit date: 04/04/2004    Years since quitting: 18.3    Passive exposure: Past   Smokeless tobacco: Never  Vaping Use   Vaping Use: Never used  Substance and Sexual Activity   Alcohol use: Not Currently   Drug use: Never   Sexual activity: Yes    Birth control/protection: Surgical  Other Topics Concern   Not on file  Social History Narrative   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   Married   1 son- born 2004 has autism   Enjoys exercise, special olympics with son   Drinks 4-5 cups of coffee a day       Right handed   Social Determinants of Health   Financial Resource Strain: Not on file  Food Insecurity: No Food Insecurity (06/08/2022)   Hunger Vital Sign    Worried About Running Out of Food in the Last Year: Never true    Ran Out of Food in the Last Year: Never true  Transportation Needs: No Transportation Needs (06/08/2022)   PRAPARE - Administrator, Civil Service (Medical): No    Lack of Transportation (Non-Medical): No  Physical Activity: Not on file  Stress: Not on file  Social Connections: Not on file  Intimate Partner Violence: Not At Risk (06/08/2022)   Humiliation, Afraid, Rape, and Kick questionnaire    Fear of Current or Ex-Partner: No    Emotionally Abused: No    Physically Abused: No    Sexually Abused: No     Outpatient Medications Prior to Visit  Medication Sig Dispense Refill   acetaminophen (TYLENOL) 500 MG tablet Take 1,000 mg by mouth every 6 (six) hours as needed for moderate pain.     apixaban (ELIQUIS) 5 MG TABS tablet Take 1 tablet (5 mg total) by mouth 2 (two) times daily. 60 tablet 2   levothyroxine (SYNTHROID) 50 MCG tablet Take 1 tablet (50 mcg total) by mouth daily. 90 tablet 1   liothyronine (CYTOMEL) 5 MCG tablet Take 10 mcg by mouth in the morning.     Melatonin 10 MG TABS Take 10 mg by mouth at bedtime as needed (sleep).     Multiple Vitamin (MULTIVITAMIN WITH MINERALS) TABS tablet Take 1 tablet by mouth daily.     pantoprazole (PROTONIX) 40 MG tablet Take 40 mg by mouth daily as needed (acid reflux).     promethazine (PHENERGAN) 25 MG tablet Take 1 tablet (25 mg total) by mouth every 8 (eight) hours as needed for nausea or vomiting. 30 tablet 1   sertraline (ZOLOFT) 50 MG tablet Take 1 tablet (50 mg total) by mouth daily. 30 tablet 1   SUMAtriptan (IMITREX) 100 MG tablet Take 1 tablet (100 mg total) by mouth at onset of migraine. May repeat in 2 hours if headache persists or recurs. Do not take more than 2 doses in 24 hours. (Patient taking differently: Take 100 mg by mouth as needed (at onset of migraine- may repeat once in 2 hours, if no relief- max of 2 doses/24 hours).) 10 tablet 2   traZODone (DESYREL) 100 MG tablet TAKE TWO TABLETS BY MOUTH EVERY NIGHT AT BEDTIME 180 tablet 1   fluticasone-salmeterol (ADVAIR HFA) 115-21 MCG/ACT inhaler Inhale 2 puffs into the lungs 2 (two) times daily. (Patient taking differently: Inhale 2 puffs into the lungs daily.) 1 each 12   gabapentin (NEURONTIN) 300 MG capsule TAKE ONE CAPSULE BY MOUTH AT BEDTIME 90 capsule 1   methocarbamol (ROBAXIN) 500  MG tablet Take 1 tablet (500 mg total) by mouth 3 (three) times daily. (Patient taking differently: Take 500 mg by mouth 3 (three) times daily as needed for muscle spasms.) 30 tablet 0   naltrexone  (DEPADE) 50 MG tablet 1/2 tab by mouth once daily for 1 week, then increase to a full tab once daily on week two. 30 tablet 0   rosuvastatin (CRESTOR) 20 MG tablet Take 1 tablet (20 mg total) by mouth daily. 90 tablet 3   tirzepatide (ZEPBOUND) 2.5 MG/0.5ML Pen Inject 2.5 mg into the skin once a week. 2 mL 0   No facility-administered medications prior to visit.    Allergies  Allergen Reactions   Naltrexone Nausea Only   Rosuvastatin Nausea Only   Tape Itching and Other (See Comments)    Skin itches if tape is left on for awhile   Tramadol Nausea And Vomiting   Effexor [Venlafaxine] Nausea Only and Rash   Norco [Hydrocodone-Acetaminophen] Rash   Voltaren [Diclofenac] Rash    Review of Systems  Constitutional:  Positive for weight loss.  Musculoskeletal:  Positive for back pain.       Objective:    Physical Exam Constitutional:      General: She is not in acute distress.    Appearance: Normal appearance. She is not ill-appearing.  HENT:     Head: Normocephalic and atraumatic.     Right Ear: External ear normal.     Left Ear: External ear normal.  Eyes:     Extraocular Movements: Extraocular movements intact.     Pupils: Pupils are equal, round, and reactive to light.  Neck:     Comments: Slight thyroid fullness bilaterally  Cardiovascular:     Rate and Rhythm: Normal rate and regular rhythm.     Heart sounds: Normal heart sounds. No murmur heard.    No gallop.  Pulmonary:     Effort: Pulmonary effort is normal. No respiratory distress.     Breath sounds: Normal breath sounds. No wheezing or rales.  Skin:    General: Skin is warm and dry.  Neurological:     Mental Status: She is alert and oriented to person, place, and time.  Psychiatric:        Judgment: Judgment normal.     BP 92/65 (BP Location: Right Arm, Patient Position: Sitting, Cuff Size: Large)   Pulse 88   Temp 98.1 F (36.7 C) (Oral)   Resp 16   Wt 214 lb (97.1 kg)   SpO2 97%   BMI 31.60 kg/m   Wt Readings from Last 3 Encounters:  07/27/22 214 lb (97.1 kg)  06/25/22 234 lb (106.1 kg)  06/23/22 232 lb 9.4 oz (105.5 kg)       Assessment & Plan:  Subclinical hypothyroidism Assessment & Plan: Clinically stable on synthroid/cytomel. She states that her integrative medicine MD is leaving the practice and she wishes for me to continue to manage. Will obtain TSH.  Orders: -     TSH  Mixed hyperlipidemia Assessment & Plan: Crestor was discontinued due to nausea.  I am suspicious that her nausea was more related to her cholecystitis. She is willing to try a different statin. Rx sent for atorvastatin.   Orders: -     Comprehensive metabolic panel  Status post laparoscopic cholecystectomy Assessment & Plan: She is doing well post-operatively.    Systemic lupus erythematosus, unspecified SLE type, unspecified organ involvement status Assessment & Plan: Awaiting follow up with Dr. Corliss Skains- rheumatology.  Other acute pulmonary embolism without acute cor pulmonale Assessment & Plan: She is maintained on eliquis.  This is being managed by hematology and she will likely need lifelong anticoagulation therapy.    Hot flashes Assessment & Plan: Was taking gabapentin but she has discontinued. She is doing OK without gabapentin. Monitor.    Anxiety Assessment & Plan: Stable on sertraline. Has also had some weight loss which she is pleased with.    Alcohol abuse Assessment & Plan: She did not tolerate naltrexone due to nausea. She is doing well on her own abstaining from alcohol.    Degenerative disc disease, lumbar Assessment & Plan: Recent increased pain.  Unable to take NSAIDS due to eliquis.  Will give short course of steroids.    Gastroesophageal reflux disease, unspecified whether esophagitis present Assessment & Plan: Stable on pantoprazole.    Migraine without aura and without status migrainosus, not intractable Assessment & Plan: Stable with occasional  prn use of imitrex. Continue same.    Other orders -     Atorvastatin Calcium; Take 1 tablet (20 mg total) by mouth daily.  Dispense: 90 tablet; Refill: 1 -     methylPREDNISolone; Take per package directions  Dispense: 21 tablet; Refill: 0     I,Shelbee Rivera,acting as a scribe for Lemont Fillers, NP.,have documented all relevant documentation on the behalf of Lemont Fillers, NP,as directed by  Lemont Fillers, NP while in the presence of Lemont Fillers, NP.   I, Lemont Fillers, NP, personally preformed the services described in this documentation.  All medical record entries made by the scribe were at my direction and in my presence.  I have reviewed the chart and discharge instructions (if applicable) and agree that the record reflects my personal performance and is accurate and complete. 07/27/22   Lemont Fillers, NP

## 2022-07-27 ENCOUNTER — Ambulatory Visit (INDEPENDENT_AMBULATORY_CARE_PROVIDER_SITE_OTHER): Payer: BC Managed Care – PPO | Admitting: Family

## 2022-07-27 VITALS — BP 92/65 | HR 88 | Temp 98.1°F | Resp 16 | Wt 214.0 lb

## 2022-07-27 DIAGNOSIS — E782 Mixed hyperlipidemia: Secondary | ICD-10-CM | POA: Diagnosis not present

## 2022-07-27 DIAGNOSIS — Z9049 Acquired absence of other specified parts of digestive tract: Secondary | ICD-10-CM | POA: Diagnosis not present

## 2022-07-27 DIAGNOSIS — I2699 Other pulmonary embolism without acute cor pulmonale: Secondary | ICD-10-CM

## 2022-07-27 DIAGNOSIS — F1010 Alcohol abuse, uncomplicated: Secondary | ICD-10-CM

## 2022-07-27 DIAGNOSIS — G43009 Migraine without aura, not intractable, without status migrainosus: Secondary | ICD-10-CM

## 2022-07-27 DIAGNOSIS — G43909 Migraine, unspecified, not intractable, without status migrainosus: Secondary | ICD-10-CM | POA: Insufficient documentation

## 2022-07-27 DIAGNOSIS — M329 Systemic lupus erythematosus, unspecified: Secondary | ICD-10-CM

## 2022-07-27 DIAGNOSIS — E038 Other specified hypothyroidism: Secondary | ICD-10-CM

## 2022-07-27 DIAGNOSIS — F419 Anxiety disorder, unspecified: Secondary | ICD-10-CM

## 2022-07-27 DIAGNOSIS — K219 Gastro-esophageal reflux disease without esophagitis: Secondary | ICD-10-CM

## 2022-07-27 DIAGNOSIS — M5136 Other intervertebral disc degeneration, lumbar region: Secondary | ICD-10-CM

## 2022-07-27 DIAGNOSIS — Z23 Encounter for immunization: Secondary | ICD-10-CM

## 2022-07-27 DIAGNOSIS — R232 Flushing: Secondary | ICD-10-CM

## 2022-07-27 LAB — COMPREHENSIVE METABOLIC PANEL
ALT: 22 U/L (ref 0–35)
AST: 18 U/L (ref 0–37)
Albumin: 4.7 g/dL (ref 3.5–5.2)
Alkaline Phosphatase: 73 U/L (ref 39–117)
BUN: 12 mg/dL (ref 6–23)
CO2: 27 mEq/L (ref 19–32)
Calcium: 9.8 mg/dL (ref 8.4–10.5)
Chloride: 103 mEq/L (ref 96–112)
Creatinine, Ser: 0.84 mg/dL (ref 0.40–1.20)
GFR: 85.8 mL/min (ref >60.00)
Glucose, Bld: 84 mg/dL (ref 70–99)
Potassium: 4.3 mEq/L (ref 3.5–5.1)
Sodium: 140 mEq/L (ref 135–145)
Total Bilirubin: 0.4 mg/dL (ref 0.2–1.2)
Total Protein: 6.9 g/dL (ref 6.0–8.3)

## 2022-07-27 LAB — TSH: TSH: 0.63 u[IU]/mL (ref 0.35–5.50)

## 2022-07-27 MED ORDER — METHYLPREDNISOLONE 4 MG PO TBPK: ORAL_TABLET | ORAL | 0 refills | Status: DC

## 2022-07-27 MED ORDER — ATORVASTATIN CALCIUM 20 MG PO TABS: 20.0000 mg | ORAL_TABLET | Freq: Every day | ORAL | 1 refills | Status: DC

## 2022-07-27 NOTE — Assessment & Plan Note (Signed)
Stable on sertraline. Has also had some weight loss which she is pleased with.

## 2022-07-27 NOTE — Assessment & Plan Note (Addendum)
Clinically stable on synthroid/cytomel. She states that her integrative medicine MD is leaving the practice and she wishes for me to continue to manage. Will obtain TSH.

## 2022-07-27 NOTE — Assessment & Plan Note (Signed)
Crestor was discontinued due to nausea.  I am suspicious that her nausea was more related to her cholecystitis. She is willing to try a different statin. Rx sent for atorvastatin.

## 2022-07-27 NOTE — Assessment & Plan Note (Signed)
Awaiting follow up with Dr. Corliss Skains- rheumatology.

## 2022-07-27 NOTE — Assessment & Plan Note (Signed)
She is maintained on eliquis.  This is being managed by hematology and she will likely need lifelong anticoagulation therapy.

## 2022-07-27 NOTE — Assessment & Plan Note (Signed)
She is doing well post-operatively.

## 2022-07-27 NOTE — Assessment & Plan Note (Signed)
Stable with occasional prn use of imitrex. Continue same.

## 2022-07-27 NOTE — Assessment & Plan Note (Signed)
Recent increased pain.  Unable to take NSAIDS due to eliquis.  Will give short course of steroids.

## 2022-07-27 NOTE — Assessment & Plan Note (Signed)
Stable on pantoprazole

## 2022-07-27 NOTE — Assessment & Plan Note (Signed)
She did not tolerate naltrexone due to nausea. She is doing well on her own abstaining from alcohol.

## 2022-07-27 NOTE — Assessment & Plan Note (Signed)
Was taking gabapentin but she has discontinued. She is doing OK without gabapentin. Monitor.

## 2022-07-28 ENCOUNTER — Encounter: Payer: Self-pay | Admitting: Family

## 2022-07-28 ENCOUNTER — Other Ambulatory Visit (HOSPITAL_COMMUNITY): Payer: Self-pay

## 2022-07-28 DIAGNOSIS — M25551 Pain in right hip: Secondary | ICD-10-CM

## 2022-07-28 MED ORDER — OXYCODONE-ACETAMINOPHEN 5-325 MG PO TABS: 1.0000 | ORAL_TABLET | Freq: Four times a day (QID) | ORAL | 0 refills | Status: AC | PRN

## 2022-07-28 NOTE — Addendum Note (Signed)
Addended by: Wilford Corner on: 07/28/2022 03:50 PM   Modules accepted: Orders

## 2022-07-28 NOTE — Addendum Note (Signed)
Addended by: Sandford Craze on: 07/28/2022 06:06 PM   Modules accepted: Orders

## 2022-07-29 ENCOUNTER — Encounter: Payer: BC Managed Care – PPO | Admitting: Behavioral Health

## 2022-07-29 DIAGNOSIS — F411 Generalized anxiety disorder: Secondary | ICD-10-CM

## 2022-07-29 NOTE — Addendum Note (Signed)
Addended by: Sandford Craze on: 07/29/2022 11:52 AM   Modules accepted: Orders

## 2022-07-29 NOTE — Progress Notes (Signed)
                Kim Pham, [facility] encounter was created in error - please disregard. This encounter was created in error - please disregard. This encounter was created in error - please disregard.

## 2022-07-30 ENCOUNTER — Encounter: Payer: Self-pay | Admitting: Behavioral Health

## 2022-08-03 ENCOUNTER — Ambulatory Visit: Payer: BC Managed Care – PPO | Admitting: Orthopaedic Surgery

## 2022-08-07 ENCOUNTER — Other Ambulatory Visit: Payer: Self-pay | Admitting: Family Medicine

## 2022-08-08 ENCOUNTER — Encounter (HOSPITAL_BASED_OUTPATIENT_CLINIC_OR_DEPARTMENT_OTHER): Payer: Self-pay

## 2022-08-08 ENCOUNTER — Other Ambulatory Visit: Payer: Self-pay

## 2022-08-08 ENCOUNTER — Emergency Department (HOSPITAL_BASED_OUTPATIENT_CLINIC_OR_DEPARTMENT_OTHER)
Admission: EM | Admit: 2022-08-08 | Discharge: 2022-08-08 | Disposition: A | Discharge status: Home / Self Care | Payer: BC Managed Care – PPO | Attending: Emergency Medicine | Admitting: Emergency Medicine

## 2022-08-08 ENCOUNTER — Emergency Department (HOSPITAL_BASED_OUTPATIENT_CLINIC_OR_DEPARTMENT_OTHER): Payer: BC Managed Care – PPO

## 2022-08-08 DIAGNOSIS — R197 Diarrhea, unspecified: Secondary | ICD-10-CM | POA: Insufficient documentation

## 2022-08-08 DIAGNOSIS — R109 Unspecified abdominal pain: Secondary | ICD-10-CM | POA: Diagnosis not present

## 2022-08-08 DIAGNOSIS — Z7901 Long term (current) use of anticoagulants: Secondary | ICD-10-CM | POA: Insufficient documentation

## 2022-08-08 DIAGNOSIS — R1031 Right lower quadrant pain: Secondary | ICD-10-CM | POA: Diagnosis not present

## 2022-08-08 DIAGNOSIS — R112 Nausea with vomiting, unspecified: Secondary | ICD-10-CM | POA: Insufficient documentation

## 2022-08-08 DIAGNOSIS — R7989 Other specified abnormal findings of blood chemistry: Secondary | ICD-10-CM | POA: Insufficient documentation

## 2022-08-08 DIAGNOSIS — R1110 Vomiting, unspecified: Secondary | ICD-10-CM

## 2022-08-08 LAB — URINALYSIS, ROUTINE W REFLEX MICROSCOPIC
Bilirubin Urine: NEGATIVE
Glucose, UA: NEGATIVE mg/dL
Hgb urine dipstick: NEGATIVE
Ketones, ur: NEGATIVE mg/dL
Nitrite: NEGATIVE
Protein, ur: NEGATIVE mg/dL
Specific Gravity, Urine: 1.01 (ref 1.005–1.030)
pH: 6 (ref 5.0–8.0)

## 2022-08-08 LAB — URINALYSIS, MICROSCOPIC (REFLEX): RBC / HPF: NONE SEEN RBC/hpf (ref 0–5)

## 2022-08-08 LAB — CBC
HCT: 40.8 % (ref 36.0–46.0)
Hemoglobin: 13.5 g/dL (ref 12.0–15.0)
MCH: 28.5 pg (ref 26.0–34.0)
MCHC: 33.1 g/dL (ref 30.0–36.0)
MCV: 86.1 fL (ref 80.0–100.0)
Platelets: 207 10*3/uL (ref 150–400)
RBC: 4.74 MIL/uL (ref 3.87–5.11)
RDW: 12.5 % (ref 11.5–15.5)
WBC: 4.5 10*3/uL (ref 4.0–10.5)
nRBC: 0 % (ref 0.0–0.2)

## 2022-08-08 LAB — COMPREHENSIVE METABOLIC PANEL
ALT: 22 U/L (ref 0–44)
AST: 20 U/L (ref 15–41)
Albumin: 4.4 g/dL (ref 3.5–5.0)
Alkaline Phosphatase: 65 U/L (ref 38–126)
Anion gap: 10 (ref 5–15)
BUN: 11 mg/dL (ref 6–20)
CO2: 22 mmol/L (ref 22–32)
Calcium: 9.6 mg/dL (ref 8.9–10.3)
Chloride: 105 mmol/L (ref 98–111)
Creatinine, Ser: 0.86 mg/dL (ref 0.44–1.00)
GFR, Estimated: >60 mL/min (ref >60)
Glucose, Bld: 96 mg/dL (ref 70–99)
Potassium: 3.8 mmol/L (ref 3.5–5.1)
Sodium: 137 mmol/L (ref 135–145)
Total Bilirubin: 0.6 mg/dL (ref 0.3–1.2)
Total Protein: 7.4 g/dL (ref 6.5–8.1)

## 2022-08-08 LAB — LIPASE, BLOOD: Lipase: 69 U/L — ABNORMAL HIGH (ref 11–51)

## 2022-08-08 MED ORDER — DICYCLOMINE HCL 20 MG PO TABS: 20.0000 mg | ORAL_TABLET | Freq: Two times a day (BID) | ORAL | 0 refills | Status: DC

## 2022-08-08 MED ORDER — IOHEXOL 300 MG/ML  SOLN
100.0000 mL | Freq: Once | INTRAMUSCULAR | Status: AC | PRN
  Administered 2022-08-08: 100 mL via INTRAVENOUS

## 2022-08-08 MED ORDER — ONDANSETRON 4 MG PO TBDP: 4.0000 mg | ORAL_TABLET | Freq: Three times a day (TID) | ORAL | 0 refills | Status: DC | PRN

## 2022-08-08 MED ORDER — ONDANSETRON HCL 4 MG/2ML IJ SOLN
4.0000 mg | Freq: Once | INTRAMUSCULAR | Status: AC
  Administered 2022-08-08: 4 mg via INTRAVENOUS
  Filled 2022-08-08: qty 2

## 2022-08-08 MED ORDER — OXYCODONE-ACETAMINOPHEN 5-325 MG PO TABS: 1.0000 | ORAL_TABLET | Freq: Four times a day (QID) | ORAL | 0 refills | Status: DC | PRN

## 2022-08-08 MED ORDER — LACTATED RINGERS IV BOLUS
1000.0000 mL | Freq: Once | INTRAVENOUS | Status: AC
  Administered 2022-08-08: 1000 mL via INTRAVENOUS

## 2022-08-08 MED ORDER — DICYCLOMINE HCL 10 MG PO CAPS
10.0000 mg | ORAL_CAPSULE | Freq: Once | ORAL | Status: AC
  Administered 2022-08-08: 10 mg via ORAL
  Filled 2022-08-08: qty 1

## 2022-08-08 MED ORDER — MORPHINE SULFATE (PF) 4 MG/ML IV SOLN
4.0000 mg | Freq: Once | INTRAVENOUS | Status: AC
  Administered 2022-08-08: 4 mg via INTRAVENOUS
  Filled 2022-08-08: qty 1

## 2022-08-08 MED ORDER — OXYCODONE-ACETAMINOPHEN 5-325 MG PO TABS
1.0000 | ORAL_TABLET | Freq: Once | ORAL | Status: AC
  Administered 2022-08-08: 1 via ORAL
  Filled 2022-08-08: qty 1

## 2022-08-08 NOTE — ED Triage Notes (Signed)
Patient presents to ED via POV from home. Here with RLQ pain, nausea, vomiting and diarrhea. Symptoms began yesterday.

## 2022-08-08 NOTE — ED Provider Notes (Signed)
EMERGENCY DEPARTMENT AT [facility] Provider Note   CSN: [id number] Arrival date & time: 08/08/22  1445     History  Chief Complaint  Patient presents with   Abdominal Pain    MERISSA RENWICK is a 42 y.o. female.  With PMH of lupus, Chiari malformation, GERD, anxiety, obesity, PE on Eliquis with history of abdominal hysterectomy and oophorectomy bilateral, cholecystectomy who presents with right lower quadrant pain nausea and vomiting with diarrhea.  Patient developed dull aching pain in the periumbilical region late last night that progressed to right lower quadrant.  Is been associate with nausea nonbloody nonbilious emesis and nonbloody diarrhea.  She has had decreased appetite and decreased p.o. intake.  Pain worse with movement.  Does not have any pain in the back.  No pain with urination or blood in the urine.  Has not had any fevers or chills.  Was concerned could have appendicitis.   Abdominal Pain      Home Medications Prior to Admission medications   Medication Sig Start Date End Date Taking? Authorizing Provider  dicyclomine (BENTYL) 20 MG tablet Take 1 tablet (20 mg total) by mouth 2 (two) times daily. 08/08/22  Yes Mardene Sayer, MD  ondansetron (ZOFRAN-ODT) 4 MG disintegrating tablet Take 1 tablet (4 mg total) by mouth every 8 (eight) hours as needed. 08/08/22  Yes Mardene Sayer, MD  oxyCODONE-acetaminophen (PERCOCET/ROXICET) 5-325 MG tablet Take 1 tablet by mouth every 6 (six) hours as needed for up to 5 doses for severe pain. 08/08/22  Yes Mardene Sayer, MD  acetaminophen (TYLENOL) 500 MG tablet Take 1,000 mg by mouth every 6 (six) hours as needed for moderate pain.    [provider]  apixaban (ELIQUIS) 5 MG TABS tablet Take 1 tablet (5 mg total) by mouth 2 (two) times daily. 06/28/22   Josph Macho, MD  atorvastatin (LIPITOR) 20 MG tablet Take 1 tablet (20 mg total) by mouth daily. 07/27/22   Sandford Craze, NP   levothyroxine (SYNTHROID) 50 MCG tablet Take 1 tablet (50 mcg total) by mouth daily. 05/19/22   Sandford Craze, NP  liothyronine (CYTOMEL) 5 MCG tablet Take 10 mcg by mouth in the morning.    [provider]  Melatonin 10 MG TABS Take 10 mg by mouth at bedtime as needed (sleep).    [provider]  methylPREDNISolone (MEDROL DOSEPAK) 4 MG TBPK tablet Take per package directions 07/27/22   Sandford Craze, NP  Multiple Vitamin (MULTIVITAMIN WITH MINERALS) TABS tablet Take 1 tablet by mouth daily.    [provider]  pantoprazole (PROTONIX) 40 MG tablet Take 40 mg by mouth daily as needed (acid reflux).    [provider]  promethazine (PHENERGAN) 25 MG tablet Take 1 tablet (25 mg total) by mouth every 8 (eight) hours as needed for nausea or vomiting. 05/18/22   Sandford Craze, NP  sertraline (ZOLOFT) 50 MG tablet Take 1 tablet (50 mg total) by mouth daily. 06/25/22   Sandford Craze, NP  SUMAtriptan (IMITREX) 100 MG tablet Take 1 tablet (100 mg total) by mouth at onset of migraine. May repeat in 2 hours if headache persists or recurs. Do not take more than 2 doses in 24 hours. Patient taking differently: Take 100 mg by mouth as needed (at onset of migraine- may repeat once in 2 hours, if no relief- max of 2 doses/24 hours). 06/29/17   Anson Fret, MD  traZODone (DESYREL) 100 MG tablet TAKE TWO  TABLETS BY MOUTH EVERY NIGHT AT BEDTIME 04/13/22   Sandford Craze, NP      Allergies    Naltrexone, Rosuvastatin, Tape, Tramadol, Effexor [venlafaxine], Norco [hydrocodone-acetaminophen], and Voltaren [diclofenac]    Review of Systems   Review of Systems  Gastrointestinal:  Positive for abdominal pain.    Physical Exam Updated Vital Signs BP 104/72   Pulse 80   Temp 98.6 F (37 C) (Oral)   Resp 16   Ht  (1.753 m)   Wt 92.5 kg   SpO2 99%   BMI 30.13 kg/m  Physical Exam Constitutional: Alert and oriented. Well appearing and in no  distress. Eyes: Conjunctivae are normal. ENT      Head: Normocephalic and atraumatic. Cardiovascular: Mildly tachycardic, regular rhythm Respiratory: Normal respiratory effort. Breath sounds are normal.  O2 sat 98 on RA Gastrointestinal: Soft and nondistended with right lower quadrant tenderness and left lower quadrant tenderness on exam with voluntary guarding on right lower quadrant, + Rovsings.  No CVA tenderness.  Abdomen is not peritonitic.  Cholecystectomy scars laparoscopic clean dry and intact. Musculoskeletal: Normal range of motion in all extremities.      Right lower leg: No tenderness or edema.      Left lower leg: No tenderness or edema. Neurologic: Normal speech and language. No gross focal neurologic deficits are appreciated. Skin: Skin is warm, dry and intact. No rash noted. Psychiatric: Mood and affect are normal. Speech and behavior are normal.  ED Results / Procedures / Treatments   Labs (all labs ordered are listed, but only abnormal results are displayed) Labs Reviewed  LIPASE, BLOOD - Abnormal; Notable for the following components:      Result Value   Lipase 69 (*)    All other components within normal limits  URINALYSIS, ROUTINE W REFLEX MICROSCOPIC - Abnormal; Notable for the following components:   Leukocytes,Ua SMALL (*)    All other components within normal limits  URINALYSIS, MICROSCOPIC (REFLEX) - Abnormal; Notable for the following components:   Bacteria, UA FEW (*)    All other components within normal limits  COMPREHENSIVE METABOLIC PANEL  CBC    EKG None  Radiology CT ABDOMEN PELVIS W CONTRAST  Result Date: 08/08/2022 CLINICAL DATA:  Right lower quadrant abdominal pain with nausea, vomiting and diarrhea. EXAM: CT ABDOMEN AND PELVIS WITH CONTRAST TECHNIQUE: Multidetector CT imaging of the abdomen and pelvis was performed using the standard protocol following bolus administration of intravenous contrast. RADIATION DOSE REDUCTION: This exam was  performed according to the departmental dose-optimization program which includes automated exposure control, adjustment of the mA and/or kV according to patient size and/or use of iterative reconstruction technique. CONTRAST:  OMNIPAQUE IOHEXOL 300 MG/ML  SOLN COMPARISON:  CT scan 05/11/2022 FINDINGS: Lower chest: The lung bases are clear of acute process. No pleural effusion or pulmonary lesions. The heart is normal in size. No pericardial effusion. The distal esophagus and aorta are unremarkable. Hepatobiliary: No hepatic lesions or intrahepatic biliary dilatation. The gallbladder is surgically absent. No common bile duct dilatation. The portal and hepatic veins are patent. Pancreas: No mass, inflammation or ductal dilatation. Spleen: Normal size.  No focal lesions. Adrenals/Urinary Tract: Adrenal glands and kidneys are normal. The bladder is unremarkable. Stomach/Bowel: Stomach, duodenum, small bowel and colon grossly normal. No acute inflammatory process, mass lesions or obstructive findings. The terminal ileum and appendix are normal. Scattered colonic diverticulosis without findings for acute diverticulitis. Vascular/Lymphatic: The aorta is normal in caliber. No dissection. The branch vessels  are patent. The major venous structures are patent. No mesenteric or retroperitoneal mass or adenopathy. Small scattered lymph nodes are noted. Reproductive: Surgically absent. Other: No pelvic mass or adenopathy. No free pelvic fluid collections. No inguinal mass or adenopathy. No abdominal wall hernia or subcutaneous lesions. Musculoskeletal: No significant bony findings. IMPRESSION: 1. No acute abdominal/pelvic findings, mass lesions or adenopathy. 2. Status post cholecystectomy. No biliary dilatation. Electronically Signed Rudie MeyerGallerani M.D.   On: 08/08/2022 17:03    Procedures Procedures    Medications Ordered in ED Medications  lactated ringers bolus 1,000 mL ( Intravenous Stopped 08/08/22 1750)   morphine (PF) 4 MG/ML injection 4 mg (4 mg Intravenous Given 08/08/22 1638)  ondansetron (ZOFRAN) injection 4 mg (4 mg Intravenous Given 08/08/22 1638)  iohexol (OMNIPAQUE) 300 MG/ML solution 100 mL (100 mLs Intravenous Contrast Given 08/08/22 1648)  dicyclomine (BENTYL) capsule 10 mg (10 mg Oral Given 08/08/22 1806)  oxyCODONE-acetaminophen (PERCOCET/ROXICET) 5-325 MG per tablet 1 tablet (1 tablet Oral Given 08/08/22 1839)    ED Course/ Medical Decision Making/ A&P                             Medical Decision Making SHERIAN VALENZA is a 42 y.o. female.  With PMH of lupus, Chiari malformation, GERD, anxiety, obesity, PE on Eliquis with history of abdominal hysterectomy and oophorectomy bilateral, cholecystectomy who presents with right lower quadrant pain nausea and vomiting with diarrhea.  Based on the patient's right lower quadrant pain, differential includes but is not limited to appendicitis, UTI, SBO, atypical diverticulitis, colitis, gastroenteritis. Less likely nephrolithiasis or pyelonephritis with no CVAT and no urinary symptoms.  Patient has hysterectomy and bilateral oophorectomy history which rules out ovarian issues.  Labs reviewed by me generally unremarkable.  Normal white blood cell count 4.5.  No transaminitis.  Normal creatinine 0.86.  Lipase slightly elevated 69 but no pain in the upper abdomen and levels not consistent with acute pancreatitis.  UA with few bacteria 6-10 WBCs and small leukocyte esterase but no nitrate no urinary symptoms, will hold off from treatment and allow from culture to reflex.  CTAP with contrast obtained which I personally reviewed showing no evidence of hydronephrosis.  No evidence of appendicitis.  No acute intra-abdominal pathology.  Suspect possible gastroenteritis possible IBS.  Patient tolerating p.o.  Will discharge with Zofran and short course of pain medicine and advise close follow-up with PCP.  ---      Amount and/or Complexity of Data  Reviewed Labs: ordered. Radiology: ordered.  Risk Prescription drug management.      Final Clinical Impression(s) / ED Diagnoses Final diagnoses:  Right lower quadrant abdominal pain  Vomiting and diarrhea    Rx / DC Orders ED Discharge Orders          Ordered    oxyCODONE-acetaminophen (PERCOCET/ROXICET) 5-325 MG tablet  Every 6 hours PRN        08/08/22 1839    ondansetron (ZOFRAN-ODT) 4 MG disintegrating tablet  Every 8 hours PRN        08/08/22 1839    dicyclomine (BENTYL) 20 MG tablet  2 times daily        08/08/22 1839              Mardene Sayer, MD 08/08/22 1858

## 2022-08-08 NOTE — Discharge Instructions (Addendum)
  You have been seen in the Emergency Department (ED) for abdominal pain. Your workup was generally reassuring; however, it did not identify a clear cause of your symptoms.  We suspect you could have a mild gastroenteritis.  Keep yourself hydrated.  Take the Zofran as needed for nausea or vomiting.  Please follow up with your primary care doctor as soon as possible regarding today's ED visit and the symptoms that are bothering you.  Return to the ED if your abdominal pain worsens or fails to improve, you develop bloody vomiting, bloody diarrhea, you are unable to tolerate fluids due to vomiting, fever greater than 101, or any other concerning symptoms.

## 2022-08-08 NOTE — ED Notes (Signed)
Attempted x 2 to obtain IV access 

## 2022-08-09 ENCOUNTER — Encounter: Payer: Self-pay | Admitting: Physician Assistant

## 2022-08-09 ENCOUNTER — Other Ambulatory Visit (INDEPENDENT_AMBULATORY_CARE_PROVIDER_SITE_OTHER): Payer: BC Managed Care – PPO

## 2022-08-09 ENCOUNTER — Telehealth: Payer: Self-pay | Admitting: Physician Assistant

## 2022-08-09 ENCOUNTER — Encounter (HOSPITAL_BASED_OUTPATIENT_CLINIC_OR_DEPARTMENT_OTHER): Payer: Self-pay | Admitting: Urology

## 2022-08-09 ENCOUNTER — Emergency Department (HOSPITAL_BASED_OUTPATIENT_CLINIC_OR_DEPARTMENT_OTHER)
Admission: EM | Admit: 2022-08-09 | Discharge: 2022-08-09 | Discharge status: Against Medical Advice | Payer: BC Managed Care – PPO | Attending: Emergency Medicine | Admitting: Emergency Medicine

## 2022-08-09 ENCOUNTER — Ambulatory Visit (INDEPENDENT_AMBULATORY_CARE_PROVIDER_SITE_OTHER): Payer: BC Managed Care – PPO | Admitting: Physician Assistant

## 2022-08-09 ENCOUNTER — Other Ambulatory Visit: Payer: Self-pay

## 2022-08-09 ENCOUNTER — Encounter: Payer: Self-pay | Admitting: Family

## 2022-08-09 DIAGNOSIS — M545 Low back pain, unspecified: Secondary | ICD-10-CM | POA: Diagnosis not present

## 2022-08-09 DIAGNOSIS — M5440 Lumbago with sciatica, unspecified side: Secondary | ICD-10-CM | POA: Diagnosis not present

## 2022-08-09 DIAGNOSIS — R109 Unspecified abdominal pain: Secondary | ICD-10-CM | POA: Insufficient documentation

## 2022-08-09 DIAGNOSIS — Z5321 Procedure and treatment not carried out due to patient leaving prior to being seen by health care provider: Secondary | ICD-10-CM | POA: Diagnosis not present

## 2022-08-09 DIAGNOSIS — R200 Anesthesia of skin: Secondary | ICD-10-CM | POA: Insufficient documentation

## 2022-08-09 MED ORDER — METHOCARBAMOL 500 MG PO TABS: 500.0000 mg | ORAL_TABLET | Freq: Four times a day (QID) | ORAL | 0 refills | Status: DC | PRN

## 2022-08-09 MED ORDER — NITROFURANTOIN MONOHYD MACRO 100 MG PO CAPS: 100.0000 mg | ORAL_CAPSULE | Freq: Two times a day (BID) | ORAL | 0 refills | Status: DC

## 2022-08-09 NOTE — ED Triage Notes (Signed)
Pt states seen last night for right abdominal pain and states that feels better, states right lower back pain radiating down right buttocks and right leg  States foot feels numb and inner thigh area numbness  Had xrays at ortho today  Started on macrobid today for UTI

## 2022-08-09 NOTE — Progress Notes (Signed)
Office Visit Note   Patient: Kim Pham           Date of Birth: 08/14/1980           MRN: [id number] Visit Date: 08/09/2022              Requested by: Sandford Craze, NP [address] [place],  [place] [address] PCP: Sandford Craze, NP  Chief Complaint  Patient presents with   Lower Back - Pain      HPI: Kim Pham is a pleasant 42 year old woman who comes in with a 2-week complaint of low back pain radiating into her right buttock.  Describes her pain as moderate.  Denies any recent fever or chills though she was in the ER yesterday for right lower quadrant pain and periumbilical pain with nausea and vomiting she.  Appendicitis has been ruled out.  She does have a follow-up with her primary care.  She denies any loss of bowel or bladder control or left-sided symptoms.  The back pain was coordinated to tripping over her dog and falling directly onto her lower back  Assessment & Plan: Visit Diagnoses:  1. Acute bilateral low back pain with sciatica, sciatica laterality unspecified     Plan: Patient does tell me that she has a previous history of low back pain and thought she had been told that she had lumbar stabilization in her back.  She does certainly have degenerative changes at the transition between L5-S1.  I will call her in a muscle relaxant as she cannot take anti-inflammatories because she is on chronic anticoagulation.  I hesitate to give her Medrol Dosepak given her current recent illness.  She does have follow-up with her primary care.  She has also had a Dosepak before and while it did help her a little bit did not completely resolve her symptoms.  I will order physical therapy and discussed using topical medication.  She will follow-up in 4 weeks or sooner if she has any concerning symptoms such as radicular weakness in her lower extremities loss of bowel or bladder control  Follow-Up Instructions: Return in about 1 month (around 09/08/2022).    Ortho Exam  Patient is alert, oriented, no adenopathy, well-dressed, normal affect, normal respiratory effort. Examination of her lower back no I can most cyst noted.  She has focal tenderness over the lower back at L5-S1.  She has some minor tenderness into the right posterior buttock.  Her lower extremity strength is 5 out of 5 with dorsiflexion and plantarflexion of her ankles and legs.  Sensation is intact.  Negative straight leg raise  Imaging: CT ABDOMEN PELVIS W CONTRAST  Result Date: 08/08/2022 CLINICAL DATA:  Right lower quadrant abdominal pain with nausea, vomiting and diarrhea. EXAM: CT ABDOMEN AND PELVIS WITH CONTRAST TECHNIQUE: Multidetector CT imaging of the abdomen and pelvis was performed using the standard protocol following bolus administration of intravenous contrast. RADIATION DOSE REDUCTION: This exam was performed according to the departmental dose-optimization program which includes automated exposure control, adjustment of the mA and/or kV according to patient size and/or use of iterative reconstruction technique. CONTRAST:  OMNIPAQUE IOHEXOL 300 MG/ML  SOLN COMPARISON:  CT scan 05/11/2022 FINDINGS: Lower chest: The lung bases are clear of acute process. No pleural effusion or pulmonary lesions. The heart is normal in size. No pericardial effusion. The distal esophagus and aorta are unremarkable. Hepatobiliary: No hepatic lesions or intrahepatic biliary dilatation. The gallbladder is surgically absent. No common bile duct  dilatation. The portal and hepatic veins are patent. Pancreas: No mass, inflammation or ductal dilatation. Spleen: Normal size.  No focal lesions. Adrenals/Urinary Tract: Adrenal glands and kidneys are normal. The bladder is unremarkable. Stomach/Bowel: Stomach, duodenum, small bowel and colon grossly normal. No acute inflammatory process, mass lesions or obstructive findings. The terminal ileum and appendix are normal. Scattered colonic diverticulosis  without findings for acute diverticulitis. Vascular/Lymphatic: The aorta is normal in caliber. No dissection. The branch vessels are patent. The major venous structures are patent. No mesenteric or retroperitoneal mass or adenopathy. Small scattered lymph nodes are noted. Reproductive: Surgically absent. Other: No pelvic mass or adenopathy. No free pelvic fluid collections. No inguinal mass or adenopathy. No abdominal wall hernia or subcutaneous lesions. Musculoskeletal: No significant bony findings. IMPRESSION: 1. No acute abdominal/pelvic findings, mass lesions or adenopathy. 2. Status post cholecystectomy. No biliary dilatation. Electronically Signed   By: Rudie Meyer M.D.   On: 08/08/2022 17:03   No images are attached to the encounter.  Labs: Lab Results  Component Value Date   ESRSEDRATE 6 07/05/2017   ESRSEDRATE 4 08/23/2016   ESRSEDRATE 2 03/08/2016   CRP <1.0 10/07/2021     Lab Results  Component Value Date   ALBUMIN 4.4 08/08/2022   ALBUMIN 4.7 07/27/2022   ALBUMIN 3.8 05/31/2022    Lab Results  Component Value Date   MG 1.8 05/13/2022   MG 1.8 05/12/2022   MG 2.0 05/11/2022   Lab Results  Component Value Date   VD25OH 41 09/29/2021   VD25OH 25 (L) 07/05/2017   VD25OH 28.64 (L) 11/21/2014    No results found for: "PREALBUMIN"    Latest Ref Rng & Units 08/08/2022    2:57 PM 06/23/2022    2:10 PM 05/31/2022    1:51 PM  CBC EXTENDED  WBC 4.0 - 10.5 K/uL 4.5  5.4  4.0   RBC 3.87 - 5.11 MIL/uL 4.74  4.45  4.30   Hemoglobin 12.0 - 15.0 g/dL 16.1  09.6  04.5   HCT 36.0 - 46.0 % 40.8  38.9  38.3   Platelets 150 - 400 K/uL 207  336  241   NEUT# 1.7 - 7.7 K/uL  2.9  2.1   Lymph# 0.7 - 4.0 K/uL  1.9  1.5      There is no height or weight on file to calculate BMI.  Orders:  Orders Placed This Encounter  Procedures   XR Lumbar Spine 2-3 Views   Ambulatory referral to Physical Therapy   No orders of the defined types were placed in this encounter.     Procedures: No procedures performed  Clinical Data: No additional findings.  ROS:  All other systems negative, except as noted in the HPI. Review of Systems  All other systems reviewed and are negative.   Objective: Vital Signs: There were no vitals taken for this visit.  Specialty Comments:  No specialty comments available.  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 05/27/2022   On continuous oral anticoagulation 05/17/2022   Pulmonary embolism 05/11/2022   RUQ pain 05/07/2022   Voice hoarseness 05/07/2022   Thyroid nodule 05/04/2022   Former smoker 04/20/2022   Subclinical hypothyroidism 12/18/2021   Effusion, left knee 12/16/2021   Endometriosis 12/16/2021   Cardiac murmur 12/16/2021   Obesity (BMI 30.0-34.9) 12/16/2021   Coronary  artery calcification seen on CT scan 12/16/2021   Preventative health care 09/08/2021   Hyperlipidemia 09/08/2021   Amenorrhea 08/14/2021   Anxiety 08/14/2021   Chronic constipation 08/14/2021   Chronic headaches HISTORY OF MIGRAINES 08/14/2021   Complication of anesthesia 08/14/2021   HISTORY OF MIGRAINES 08/14/2021   OSTEOARTHRITIS 08/14/2021   Wears glasses for reading 08/14/2021   Chondromalacia, patella 06/30/2021   Family history of BRCA gene mutation 06/17/2021   Cyst of left ovary 06/17/2021   Vasomotor symptoms due to menopause 06/17/2021   Rectal bleeding 06/11/2021   Hot flashes 06/11/2021   Insomnia 08/19/2020   Gastroesophageal reflux disease 08/19/2020   Sacroiliac joint dysfunction of right side 12/19/2019   Degenerative disc disease, lumbar 09/04/2019   Psychogenic nonepileptic seizure 12/22/2016   Sicca syndrome 11/22/2016   Raynaud's disease without gangrene 11/22/2016   History of Chiari malformation 11/22/2016   High risk medication  use 11/22/2016   Other epilepsy, intractable, without status epilepticus 11/01/2016   Arthralgia 08/18/2016   SLE (systemic lupus erythematosus) 03/26/2016   Depression with anxiety 03/26/2016   Transient alteration of awareness 01/01/2016   Chiari malformation 12/19/2015   Family history of breast cancer 09/09/2015   Past Medical History:  Diagnosis Date   Acute pulmonary embolism without acute cor pulmonale 05/17/2022   Adnexal mass 02/24/2021   in epic seen on [place]   Amenorrhea 08/14/2021   Anxiety 08/14/2021   Arthralgia 08/18/2016   Back pain 09/04/2021   Blood in stool 09/08/2021   Cardiac murmur 12/16/2021   Chest discomfort 04/20/2022   Cholecystitis 05/26/2022   Chondromalacia, patella 06/30/2021   Chronic calculous cholecystitis 06/08/2022   Chronic constipation 08/14/2021   Chronic headaches HISTORY OF MIGRAINES 08/14/2021   Chronic migraine without aura, with intractable migraine, so stated, with status migrainosus 07/27/2017   Complication of anesthesia 08/14/2021   PONV   Constipation 06/11/2021   Coronary artery calcification seen on CT scan 12/16/2021   Cyst of left ovary 06/17/2021   Degenerative disc disease, lumbar 09/04/2019   Depression with anxiety 03/26/2016   Diarrhea 07/08/2017   DOE (dyspnea on exertion) 04/20/2022   Effusion, left knee 12/16/2021   Endometriosis    Family history of BRCA gene mutation 06/17/2021   [facility] genetic testing done 06/2021 - negative for breast cancer genes.   Family history of breast cancer 09/09/2015   Formatting of this note might be different from the original.  Mom dx'd age 67  Mat aunt dx'd age 52  Neither relative tested for BRCA  Formatting of this note might be different from the original.  Mom dx'd age 26  Mat aunt dx'd age 59  Neither relative tested for BRCA   Fatigue 03/26/2016   Former smoker 04/20/2022   Gastroesophageal reflux disease 08/19/2020   Hair loss 11/22/2016   Heterozygous for prothrombin  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    High risk medication use 11/22/2016   history of Chiari malformation    History of Chiari malformation 11/22/2016   history of COVID 02/2021   LOW GRADE FEVER CONGESTION AND HEADACHE ALL SYMPTOMS RESOLVED   Hot flashes 06/11/2021   Hyperlipidemia    Insomnia 08/19/2020   Low blood pressure    pt states history of low blood pressure-fainted 12/2010-instructed to move slowly   Nipple discharge 05/19/2022   Nonepileptic episode 12/22/2016   Numbness and tingling 02/25/2016   Obesity (BMI 30.0-34.9) 12/16/2021   On continuous oral anticoagulation 05/17/2022   OSTEOARTHRITIS 08/14/2021   Other epilepsy, intractable, without  status epilepticus 11/01/2016   Pain in right knee 11/13/2020   Pain in unspecified joint 08/18/2016   Palpitations 12/15/2021   Pelvic pain in female 08/18/2021   Photosensitivity 11/22/2016   PONV (postoperative nausea and vomiting)    Preventative health care 09/08/2021   Psychogenic nonepileptic seizure 07/27/2017   Pulmonary embolism    Raynaud's disease without gangrene 11/22/2016   Rectal bleeding 06/11/2021   RUQ pain 05/07/2022   Sacroiliac joint dysfunction of right side 12/19/2019   Sicca syndrome 11/22/2016   SLE (systemic lupus erythematosus) 03/26/2016   Status post laparoscopic cholecystectomy 06/08/2022   Subclinical hypothyroidism    Thyroid nodule    Transient alteration of awareness 01/01/2016   Vasomotor symptoms due to menopause 06/17/2021   Voice hoarseness 05/07/2022   Wears glasses for reading 08/14/2021    Family History  Problem Relation Age of Onset   Hypertension Mother    CAD Mother    Breast cancer Mother    Migraines Mother    Gout Mother    Heart disease Mother    Crohn's disease Mother    Hypertension Father    CAD Father    CVA Father    Heart disease Father    Colon polyps Father    Liver disease Father    Sjogren's syndrome Sister    Celiac disease Sister    Rheum arthritis Maternal  Grandmother    Dementia Maternal Grandmother    Pancreatic cancer Maternal Grandfather    CAD Paternal Grandmother    Macular degeneration Paternal Grandmother    CVA Paternal Grandfather    Autism Son    ADD / ADHD Son    Breast cancer Maternal Aunt    Esophageal cancer Neg Hx    Colon cancer Neg Hx    Stomach cancer Neg Hx     Past Surgical History:  Procedure Laterality Date   ABDOMINAL HYSTERECTOMY  2007   CHOLECYSTECTOMY N/A 06/08/2022   Procedure: LAPAROSCOPIC CHOLECYSTECTOMY WITH ICG;  Surgeon: Almond Lint, MD;  Location: [facility];  Service: General;  Laterality: N/A;   CYSTOSCOPY  2007   WITH HYSTERECTOMY   DIAGNOSTIC LAPAROSCOPY  2005   OOPHORECTOMY Left 08/18/2021   ROTATOR CUFF REPAIR Right 2010   SALPINGOOPHORECTOMY  04/16/2011   Procedure: SALPINGO OOPHERECTOMY;  Surgeon: Lenoard Aden, MD;  Location: [facility];  Service: Gynecology;  Laterality: Right;   skull decompression surgery 2005     @ [facility]   Social History   Occupational History   Occupation: nurse  Tobacco Use   Smoking status: Former    Packs/day: 1.00    Years: 10.00    Additional pack years: 0.00    Total pack years: 10.00    Types: Cigarettes    Quit date: 04/04/2004    Years since quitting: 18.3    Passive exposure: Past   Smokeless tobacco: Never  Vaping Use   Vaping Use: Never used  Substance and Sexual Activity   Alcohol use: Not Currently   Drug use: Never   Sexual activity: Yes    Birth control/protection: Surgical

## 2022-08-09 NOTE — Telephone Encounter (Signed)
Patient called. Says her leg is numb and pain is worse. Her call back number is [phone]

## 2022-08-10 ENCOUNTER — Ambulatory Visit (HOSPITAL_COMMUNITY)
Admission: RE | Admit: 2022-08-10 | Discharge: 2022-08-10 | Disposition: A | Discharge status: Home / Self Care | Payer: BC Managed Care – PPO | Source: Ambulatory Visit | Attending: Physician Assistant | Admitting: Physician Assistant

## 2022-08-10 ENCOUNTER — Telehealth: Payer: Self-pay | Admitting: Physician Assistant

## 2022-08-10 ENCOUNTER — Other Ambulatory Visit: Payer: Self-pay | Admitting: Physician Assistant

## 2022-08-10 ENCOUNTER — Encounter (HOSPITAL_COMMUNITY): Payer: Self-pay | Admitting: Radiology

## 2022-08-10 DIAGNOSIS — M5126 Other intervertebral disc displacement, lumbar region: Secondary | ICD-10-CM | POA: Diagnosis not present

## 2022-08-10 DIAGNOSIS — M5440 Lumbago with sciatica, unspecified side: Secondary | ICD-10-CM | POA: Insufficient documentation

## 2022-08-10 MED ORDER — DIAZEPAM 10 MG PO TABS
10.0000 mg | ORAL_TABLET | Freq: Four times a day (QID) | ORAL | 0 refills | Status: DC | PRN
Start: 2022-08-10 — End: 2022-08-18

## 2022-08-10 NOTE — Telephone Encounter (Signed)
MRI was ordered as stat

## 2022-08-10 NOTE — Telephone Encounter (Signed)
Pt called stating she went to ER ans advised by PA Persons to be evalutated but the wait was 5 hrs and pt went home without being seen. Pt is asking for MRI. Pt states she is still having same back pains. Pt phone number is [phone].

## 2022-08-10 NOTE — Telephone Encounter (Signed)
Called and advised pt. Looks like mri is for today at 9pm. Can you send something in for claustrophobia

## 2022-08-10 NOTE — Telephone Encounter (Signed)
Patient was advised that Valium was sent in to her pharmacy by [place].

## 2022-08-11 ENCOUNTER — Other Ambulatory Visit: Payer: Self-pay | Admitting: Physician Assistant

## 2022-08-11 DIAGNOSIS — M5440 Lumbago with sciatica, unspecified side: Secondary | ICD-10-CM

## 2022-08-11 MED ORDER — OXYCODONE-ACETAMINOPHEN 5-325 MG PO TABS: 1.0000 | ORAL_TABLET | ORAL | 0 refills | Status: DC | PRN

## 2022-08-12 ENCOUNTER — Encounter: Payer: Self-pay | Admitting: Behavioral Health

## 2022-08-12 ENCOUNTER — Ambulatory Visit (INDEPENDENT_AMBULATORY_CARE_PROVIDER_SITE_OTHER): Payer: BC Managed Care – PPO | Admitting: Behavioral Health

## 2022-08-12 DIAGNOSIS — F411 Generalized anxiety disorder: Secondary | ICD-10-CM

## 2022-08-12 NOTE — Progress Notes (Signed)
[facility] Counselor/Therapist Progress Note  Patient ID: KA BENCH, MRN: [id number],    Date: 08/12/2022  Time Spent: 35 minutes spent via video session.  The patient was at home and this therapist was at his home office.  Treatment Type: Individual Therapy  Reported Symptoms: Anxiety/stress  Mental Status Exam: Appearance:  Well Groomed     Behavior: Appropriate  Motor: Normal  Speech/Language:  Clear and Coherent  Affect: Appropriate  Mood: normal  Thought process: normal  Thought content:   WNL  Sensory/Perceptual disturbances:   WNL  Orientation: oriented to person, place, time/date, situation, day of week, and month of year  Attention: Good  Concentration: Good  Memory: WNL  Fund of knowledge:  Good  Insight:   Good  Judgment:  Good  Impulse Control: Good   Risk Assessment: Danger to Self:  No Self-injurious Behavior: No Danger to Others: No Duty to Warn:no Physical Aggression / Violence:No  Access to Firearms a concern: No  Gang Involvement:No   Subjective: The patient has been in significant pain over the past few weeks.  She has 2 herniated disc and 2 bulging disc in the lumbar region.  They are trying to schedule steroid injections and she is hopeful that can take place in the next 2 weeks.  They are also looking at physical therapy with surgery as a last resort.  For the past few weeks she comes home from work and lays down because that is the only way she gets relief.  Because of medication for high blood pressure she cannot take any NSAIDs.  She does have 1 narcotic but is taking that sparingly.  She did say twice in the past week she has taken 1 shot of liquor because she was in so much pain and nothing else was relieving it.  The first time her husband was not at home and as soon as she took a drink she vomited.  She told her husband what she had done whereas in the past she would not have.  Another day because of the intense pain she took  a shot of liquor and she did not vomit but did not want anymore and both times felt some guilt and remorse knowing she had been sober so long.  She did say a few weeks ago and getting ready for her son's birthday party she had a beer but did not want anymore.  In the past she would have continued to drink and/or tried to hide it and she has not done that.  Her husband had an alcohol issue when he was in the [employer] years ago went to AA and he is now able to drink casually without any issues.  He said his initial message was once you are drunk you are always drunk but the patient does not necessarily believe that.  She is wrestling with whether or not she can drink even a little bit and not become an issue.  We talked about the pros and cons of that.  She does say that the pain has put her in a certain mood which she is working hard to alleviate through rest, talking to her husband etc.  I reminded her of the anxiety reduction techniques we had talked about and also introduced mindfulness providing an exercise for her to try before our next session in 2 weeks. Interventions: Cognitive Behavioral Therapy  Diagnosis: Generalized anxiety disorder  Plan: I will meet with the patient every 2 weeks virtually Target date: January 17, 2023 Progress: 30%  [name], [facility]                 [name], [facility]

## 2022-08-16 ENCOUNTER — Other Ambulatory Visit: Payer: Self-pay

## 2022-08-16 ENCOUNTER — Inpatient Hospital Stay: Payer: BC Managed Care – PPO | Attending: Hematology & Oncology

## 2022-08-16 ENCOUNTER — Encounter: Payer: Self-pay | Admitting: Hematology & Oncology

## 2022-08-16 ENCOUNTER — Inpatient Hospital Stay (HOSPITAL_BASED_OUTPATIENT_CLINIC_OR_DEPARTMENT_OTHER): Payer: BC Managed Care – PPO | Admitting: Hematology & Oncology

## 2022-08-16 VITALS — BP 98/73 | HR 83 | Temp 98.3°F | Resp 16 | Ht 69.0 in | Wt 207.0 lb

## 2022-08-16 DIAGNOSIS — I2782 Chronic pulmonary embolism: Secondary | ICD-10-CM | POA: Diagnosis not present

## 2022-08-16 DIAGNOSIS — Z7901 Long term (current) use of anticoagulants: Secondary | ICD-10-CM | POA: Insufficient documentation

## 2022-08-16 DIAGNOSIS — I2609 Other pulmonary embolism with acute cor pulmonale: Secondary | ICD-10-CM

## 2022-08-16 DIAGNOSIS — Z86711 Personal history of pulmonary embolism: Secondary | ICD-10-CM | POA: Insufficient documentation

## 2022-08-16 LAB — CMP (CANCER CENTER ONLY)
ALT: 21 U/L (ref 0–44)
AST: 19 U/L (ref 15–41)
Albumin: 4.5 g/dL (ref 3.5–5.0)
Alkaline Phosphatase: 58 U/L (ref 38–126)
Anion gap: 12 (ref 5–15)
BUN: 9 mg/dL (ref 6–20)
CO2: 29 mmol/L (ref 22–32)
Calcium: 9.6 mg/dL (ref 8.9–10.3)
Chloride: 104 mmol/L (ref 98–111)
Creatinine: 0.93 mg/dL (ref 0.44–1.00)
GFR, Estimated: >60 mL/min (ref >60)
Glucose, Bld: 104 mg/dL — ABNORMAL HIGH (ref 70–99)
Potassium: 4.8 mmol/L (ref 3.5–5.1)
Sodium: 145 mmol/L (ref 135–145)
Total Bilirubin: 0.3 mg/dL (ref 0.3–1.2)
Total Protein: 6.6 g/dL (ref 6.5–8.1)

## 2022-08-16 LAB — CBC WITH DIFFERENTIAL (CANCER CENTER ONLY)
Abs Immature Granulocytes: 0.01 10*3/uL (ref 0.00–0.07)
Basophils Absolute: 0 10*3/uL (ref 0.0–0.1)
Basophils Relative: 1 %
Eosinophils Absolute: 0.2 10*3/uL (ref 0.0–0.5)
Eosinophils Relative: 5 %
HCT: 40.2 % (ref 36.0–46.0)
Hemoglobin: 13.2 g/dL (ref 12.0–15.0)
Immature Granulocytes: 0 %
Lymphocytes Relative: 39 %
Lymphs Abs: 1.3 10*3/uL (ref 0.7–4.0)
MCH: 28.7 pg (ref 26.0–34.0)
MCHC: 32.8 g/dL (ref 30.0–36.0)
MCV: 87.4 fL (ref 80.0–100.0)
Monocytes Absolute: 0.3 10*3/uL (ref 0.1–1.0)
Monocytes Relative: 8 %
Neutro Abs: 1.6 10*3/uL — ABNORMAL LOW (ref 1.7–7.7)
Neutrophils Relative %: 47 %
Platelet Count: 193 10*3/uL (ref 150–400)
RBC: 4.6 MIL/uL (ref 3.87–5.11)
RDW: 12.8 % (ref 11.5–15.5)
WBC Count: 3.3 10*3/uL — ABNORMAL LOW (ref 4.0–10.5)
nRBC: 0 % (ref 0.0–0.2)

## 2022-08-16 NOTE — Progress Notes (Signed)
Hematology and Oncology Follow Up Visit  MADDYN LIEURANCE [id number] December 08, 1980 42 y.o. 08/16/2022   Principle Diagnosis:  Acute pulmonary embolism-heterozygous for Prothrombin 2 gene mutation --05/11/2022  Current Therapy:   Eliquis 5 mg p.o. twice daily     Interim History:  Ms. Prosser is back for follow-up.  This is her second office visit.  She actually did undergo her gallbladder surgery on 06/08/2022.  She has gone through this very nicely.  We had her on Lovenox to help "bridge" while she was on Eliquis.  I felt she needed to be on continual anticoagulation given that she just had the pulmonary embolism about a month before her surgery.  She did have a CT angiogram done on 06/23/2022.  This was negative for any pulmonary embolism.  She had a CT of the abdomen pelvis on 08/08/2022.  This was done because of nausea and vomiting.  The CT scan was unremarkable.  She is on Eliquis.  She is doing well on the Eliquis.  There is no bleeding.  She does not have a monthly cycle.  She is still working for [employer].  She really does a great job for them.  I know that she has been incredibly busy.  She has had no fever.  There is been no probably COVID.  She has had no rashes.  There is been no chest wall pain.  She has had no shortness of breath.  Overall, I would say that her performance status is probably ECOG 1.  Medications:  Current Outpatient Medications:    oxyCODONE-acetaminophen (PERCOCET/ROXICET) 5-325 MG tablet, Take 1 tablet by mouth every 4 (four) hours as needed for severe pain., Disp: 20 tablet, Rfl: 0   acetaminophen (TYLENOL) 500 MG tablet, Take 1,000 mg by mouth every 6 (six) hours as needed for moderate pain., Disp: , Rfl:    apixaban (ELIQUIS) 5 MG TABS tablet, Take 1 tablet (5 mg total) by mouth 2 (two) times daily., Disp: 60 tablet, Rfl: 2   atorvastatin (LIPITOR) 20 MG tablet, Take 1 tablet (20 mg total) by mouth daily., Disp: 90 tablet, Rfl: 1   diazepam (VALIUM) 10  MG tablet, Take 1 tablet (10 mg total) by mouth every 6 (six) hours as needed (take 30 minutes prior to MRI)., Disp: 1 tablet, Rfl: 0   dicyclomine (BENTYL) 20 MG tablet, Take 1 tablet (20 mg total) by mouth 2 (two) times daily., Disp: 20 tablet, Rfl: 0   levothyroxine (SYNTHROID) 50 MCG tablet, Take 1 tablet (50 mcg total) by mouth daily., Disp: 90 tablet, Rfl: 1   liothyronine (CYTOMEL) 5 MCG tablet, Take 10 mcg by mouth in the morning., Disp: , Rfl:    Melatonin 10 MG TABS, Take 10 mg by mouth at bedtime as needed (sleep)., Disp: , Rfl:    methocarbamol (ROBAXIN) 500 MG tablet, Take 1 tablet (500 mg total) by mouth every 6 (six) hours as needed for muscle spasms., Disp: 30 tablet, Rfl: 0   methylPREDNISolone (MEDROL DOSEPAK) 4 MG TBPK tablet, Take per package directions, Disp: 21 tablet, Rfl: 0   Multiple Vitamin (MULTIVITAMIN WITH MINERALS) TABS tablet, Take 1 tablet by mouth daily., Disp: , Rfl:    nitrofurantoin, macrocrystal-monohydrate, (MACROBID) 100 MG capsule, Take 1 capsule (100 mg total) by mouth 2 (two) times daily., Disp: 14 capsule, Rfl: 0   ondansetron (ZOFRAN-ODT) 4 MG disintegrating tablet, Take 1 tablet (4 mg total) by mouth every 8 (eight) hours as needed., Disp: 20 tablet, Rfl: 0  pantoprazole (PROTONIX) 40 MG tablet, Take 40 mg by mouth daily as needed (acid reflux)., Disp: , Rfl:    promethazine (PHENERGAN) 25 MG tablet, Take 1 tablet (25 mg total) by mouth every 8 (eight) hours as needed for nausea or vomiting., Disp: 30 tablet, Rfl: 1   sertraline (ZOLOFT) 50 MG tablet, Take 1 tablet (50 mg total) by mouth daily., Disp: 30 tablet, Rfl: 1   SUMAtriptan (IMITREX) 100 MG tablet, Take 1 tablet (100 mg total) by mouth at onset of migraine. May repeat in 2 hours if headache persists or recurs. Do not take more than 2 doses in 24 hours. (Patient taking differently: Take 100 mg by mouth as needed (at onset of migraine- may repeat once in 2 hours, if no relief- max of 2 doses/24  hours).), Disp: 10 tablet, Rfl: 2   traZODone (DESYREL) 100 MG tablet, TAKE TWO TABLETS BY MOUTH EVERY NIGHT AT BEDTIME, Disp: 180 tablet, Rfl: 1  Allergies:  Allergies  Allergen Reactions   Naltrexone Nausea Only   Rosuvastatin Nausea Only   Tape Itching and Other (See Comments)    Skin itches if tape is left on for awhile   Tramadol Nausea And Vomiting   Effexor [Venlafaxine] Nausea Only and Rash   Norco [Hydrocodone-Acetaminophen] Rash   Voltaren [Diclofenac] Rash    Past Medical History, Surgical history, Social history, and Family History were reviewed and updated.  Review of Systems: Review of Systems  Constitutional: Negative.   HENT:  Negative.    Eyes: Negative.   Respiratory: Negative.    Cardiovascular: Negative.   Gastrointestinal:  Positive for diarrhea.  Endocrine: Negative.   Genitourinary: Negative.    Musculoskeletal: Negative.   Skin: Negative.   Neurological: Negative.   Hematological: Negative.   Psychiatric/Behavioral: Negative.      Physical Exam:  height is 5\' 9"  (1.753 m) and weight is 207 lb (93.9 kg). Her oral temperature is 98.3 F (36.8 C). Her blood pressure is 98/73 and her pulse is 83. Her respiration is 16 and oxygen saturation is 99%.   Wt Readings from Last 3 Encounters:  08/16/22 207 lb (93.9 kg)  08/09/22 205 lb (93 kg)  08/08/22 204 lb (92.5 kg)    Physical Exam Vitals reviewed.  HENT:     Head: Normocephalic and atraumatic.  Eyes:     Pupils: Pupils are equal, round, and reactive to light.  Cardiovascular:     Rate and Rhythm: Normal rate and regular rhythm.     Heart sounds: Normal heart sounds.  Pulmonary:     Effort: Pulmonary effort is normal.     Breath sounds: Normal breath sounds.  Abdominal:     General: Bowel sounds are normal.     Palpations: Abdomen is soft.     Comments: Abdominal exam shows well-healed laparoscopy scar.  She has no fluid wave.  There is no guarding or rebound tenderness.  There is no  palpable liver or spleen tip.  Musculoskeletal:        General: No tenderness or deformity. Normal range of motion.     Cervical back: Normal range of motion.     Comments: She has no leg swelling bilaterally.  She has a negative Homans' sign bilaterally  Lymphadenopathy:     Cervical: No cervical adenopathy.  Skin:    General: Skin is warm and dry.     Findings: No erythema or rash.  Neurological:     Mental Status: She is alert and oriented to  person, place, and time.  Psychiatric:        Behavior: Behavior normal.        Thought Content: Thought content normal.        Judgment: Judgment normal.      Lab Results  Component Value Date   WBC 3.3 (L) 08/16/2022   HGB 13.2 08/16/2022   HCT 40.2 08/16/2022   MCV 87.4 08/16/2022   PLT 193 08/16/2022     Chemistry      Component Value Date/Time   NA 145 08/16/2022 1155   NA 143 01/01/2016 0841   K 4.8 08/16/2022 1155   CL 104 08/16/2022 1155   CO2 29 08/16/2022 1155   BUN 9 08/16/2022 1155   BUN 12 01/01/2016 0841   CREATININE 0.93 08/16/2022 1155   CREATININE 0.70 07/22/2017 1542      Component Value Date/Time   CALCIUM 9.6 08/16/2022 1155   ALKPHOS 58 08/16/2022 1155   AST 19 08/16/2022 1155   ALT 21 08/16/2022 1155   BILITOT 0.3 08/16/2022 1155       Impression and Plan: [name] is a very charming 42 year old white female.  She has prothrombin 2 gene mutation.  She is heterozygous for this.  She had a pulmonary embolism back in January.  I think we will going to have to keep her on therapeutic anticoagulation for a year.  She does have the Prothrombin 2 gene mutation.  I think we have to be aggressive with anticoagulating her.  After a year of full dose anticoagulation, then I will put her on maintenance dose for a year.  She has never had a blood clot before so I think that she would not require long-term anticoagulation.  I do not think that we have to do any scans on her.  She had a CT angiogram in March  which looked okay.  I think we now can get her back probably in about 4 months or so.  Hopefully, if all looks good, we can move her appointments out longer longer.   Josph Macho, MD 4/29/20241:00 PM

## 2022-08-17 ENCOUNTER — Ambulatory Visit (INDEPENDENT_AMBULATORY_CARE_PROVIDER_SITE_OTHER): Payer: BC Managed Care – PPO | Admitting: Physical Medicine and Rehabilitation

## 2022-08-17 ENCOUNTER — Encounter: Payer: Self-pay | Admitting: Physical Medicine and Rehabilitation

## 2022-08-17 DIAGNOSIS — M47816 Spondylosis without myelopathy or radiculopathy, lumbar region: Secondary | ICD-10-CM

## 2022-08-17 DIAGNOSIS — M5416 Radiculopathy, lumbar region: Secondary | ICD-10-CM

## 2022-08-17 DIAGNOSIS — M5116 Intervertebral disc disorders with radiculopathy, lumbar region: Secondary | ICD-10-CM | POA: Diagnosis not present

## 2022-08-17 NOTE — Progress Notes (Signed)
Kim Pham - 42 y.o. female MRN [id number]  Date of birth: 08/13/1980  Office Visit Note: Visit Date: 08/17/2022 PCP: Sandford Craze, NP Referred by: Sandford Craze, NP  Subjective: Chief Complaint  Patient presents with   Lower Back - Pain   Other    Here to discuss options for back pain s/p fall about 1 month ago   HPI: Kim Pham is a 42 y.o. female who comes in today per the request of [place] B[place] Persons, PA for evaluation of chronic, worsening and severe bilateral lower back pain radiating to right buttock down anterolateral leg to foot. Intermittent radiation of pain to right medial thigh region. Pain started several weeks ago after mechanical fall. Pain worsens with prolonged standing, prolonged sitting and laying down. She describes pain as deep aching sensation, reports right leg feels very weak and tight, currently rates pain as 6 out of 10. Some relief of pain with home exercise regimen, rest and use of medications. Some relief of pain with Tylenol and topical pain creams. She is scheduled to start formal physical therapy tomorrow at [facility].  Recent lumbar MRI imaging exhibits right foraminal to extraforaminal disc protrusion at L4-L5 contacting the exiting right L4 nerve root. There is also lower lumbar facet arthropathy, more prominent at L3-L4 and L4-L5. No history of lumbar surgery/injections. 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 Her husband underwent lumbar injections in the past with good success. Patient denies focal weakness, numbness and tingling.  Patients course is complicated by [facility] Toste mutation, history of pulmonary embolism-currently taking Eliquis. Of note, she does carry multiple allergies/intolerances to medications.    Review of Systems  Musculoskeletal:  Positive for back pain.  Neurological:  Negative for tingling, sensory change, focal weakness and weakness.  All other systems reviewed and are negative.  Otherwise per  HPI.  Assessment & Plan: Visit Diagnoses:    ICD-10-CM   1. Lumbar radiculopathy  M54.16 Ambulatory referral to Physical Medicine Rehab    2. Intervertebral disc disorders with radiculopathy, lumbar region  M51.16 Ambulatory referral to Physical Medicine Rehab    3. Facet arthropathy, lumbar  M47.816 Ambulatory referral to Physical Medicine Rehab       Plan: Findings:  Chronic, worsening and severe bilateral lower back pain radiating to right buttock down anterolateral leg to foot. Intermittent radiation of pain to right medial thigh region. Patient continues to have severe pain despite good conservative therapies such as home exercise regimen, rest and use of medications. I discussed recent lumbar MRI with patient today using imaging and spine model. There is right foraminal to extraforaminal disc protrusion at L4-L5 contacting the exiting right L4 nerve root. Patients clinical presentation and exam are consistent with L4 nerve pattern. Next step is to perform diagnostic and hopefully therapeutic right L4 transforaminal epidural steroid injection under fluoroscopic guidance. If good relief of pain with injection we can repeat this procedure infrequently as needed. I discussed injection procedure in detail today, she has no questions at this time. She can remain on Eliquis with this type of injection. No red flag symptoms noted upon exam today.     Meds & Orders: No orders of the defined types were placed in this encounter.   Orders Placed This Encounter  Procedures   Ambulatory referral to Physical Medicine Rehab    Follow-up: Return for Right L4 transforaminal epidural steroid injection.   Procedures: No procedures performed      Clinical History: EXAM: MRI LUMBAR SPINE WITHOUT CONTRAST  TECHNIQUE: Multiplanar, multisequence MR imaging of the lumbar spine was performed. No intravenous contrast was administered.   COMPARISON:  Radiograph from 08/09/2022.    FINDINGS: Segmentation: Standard. Lowest well-formed disc space labeled the L5-S1 level.   Alignment: Trace levoscoliosis. Alignment otherwise normal with preservation of the normal lumbar lordosis. No significant listhesis.   Vertebrae: Vertebral body height maintained without acute or chronic fracture. Bone marrow signal intensity within normal limits. No discrete or worrisome osseous lesions. No abnormal marrow edema.   Conus medullaris and cauda equina: Conus extends to the L2-3 level. Conus and cauda equina appear normal.   Paraspinal and other soft tissues: Unremarkable.   Disc levels:   L1-2: Mild disc bulge. Mild bilateral facet hypertrophy. No spinal stenosis. Foramina remain patent.   L2-3: Mild annular disc bulge. Mild right greater than left facet hypertrophy. No spinal stenosis. Foramina remain patent.   L3-4: Mild diffuse disc bulge. Superimposed left foraminal to extraforaminal disc protrusion contacts the exiting left L3 nerve root. Mild facet and ligament flavum hypertrophy. Resultant mild spinal stenosis. Foramina remain adequately patent.   L4-5: Disc desiccation with mild disc bulge. Right foraminal to extraforaminal disc protrusion with annular fissure contacts the exiting right L4 nerve root (series 8, image 30). Mild to moderate bilateral facet hypertrophy. Resultant mild narrowing of the right lateral recess. Central canal remains patent. Mild bilateral L4 foraminal stenosis.   L5-S1: Disc desiccation with minimal annular disc bulge. No canal or lateral recess stenosis. Foramina remain patent.   IMPRESSION: 1. Left foraminal to extraforaminal disc protrusion at L3-4, contacting the exiting left L3 nerve root. 2. Right foraminal to extraforaminal disc protrusion at L4-5, contacting the exiting right L4 nerve root. 3. Lower lumbar facet hypertrophy, most pronounced at L3-4 and L4-5. Finding could contribute to lower back pain.      Electronically Signed   By: Rise Mu M.D.   On: 08/10/2022 21:39   She reports that she quit smoking about 18 years ago. Her smoking use included cigarettes. She has a 10.00 pack-year smoking history. She has been exposed to tobacco smoke. She has never used smokeless tobacco. No results for input(s): "HGBA1C", "LABURIC" in the last 8760 hours.  Objective:  VS:  HT:    WT:   BMI:     BP:   HR: bpm  TEMP: ( )  RESP:  Physical Exam Vitals and nursing note reviewed.  HENT:     Head: Normocephalic and atraumatic.     Right Ear: External ear normal.     Left Ear: External ear normal.     Nose: Nose normal.     Mouth/Throat:     Mouth: Mucous membranes are moist.  Eyes:     Extraocular Movements: Extraocular movements intact.  Cardiovascular:     Rate and Rhythm: Normal rate.     Pulses: Normal pulses.  Pulmonary:     Effort: Pulmonary effort is normal.  Abdominal:     General: Abdomen is flat. There is no distension.  Musculoskeletal:        General: Tenderness present.     Cervical back: Normal range of motion.     Comments: Patient rises from seated position to standing without difficulty. Good lumbar range of motion. No pain noted with facet loading. 5/5 strength noted with bilateral hip flexion, knee flexion/extension, ankle dorsiflexion/plantarflexion and EHL. No clonus noted bilaterally. No pain upon palpation of greater trochanters. No pain with internal/external rotation of bilateral hips. Sensation intact bilaterally. Dysesthesias noted  to right L4 dermatome. Negative slump test bilaterally. Ambulates without aid, gait steady.     Skin:    General: Skin is warm and dry.     Capillary Refill: Capillary refill takes less than 2 seconds.  Neurological:     General: No focal deficit present.     Mental Status: She is alert and oriented to person, place, and time.  Psychiatric:        Mood and Affect: Mood normal.        Behavior: Behavior normal.     Ortho  Exam  Imaging: No results found.  Past Medical/Family/Surgical/Social History: Medications & Allergies reviewed per EMR, new medications updated. Patient Active Problem List   Diagnosis Date Noted   Migraines 07/27/2022   Alcohol abuse 06/25/2022   Status post laparoscopic cholecystectomy 06/08/2022   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 (HCC) 05/27/2022   On continuous oral anticoagulation 05/17/2022   Pulmonary embolism (HCC) 05/11/2022   RUQ pain 05/07/2022   Voice hoarseness 05/07/2022   Thyroid nodule 05/04/2022   Former smoker 04/20/2022   Subclinical hypothyroidism 12/18/2021   Effusion, left knee 12/16/2021   Endometriosis 12/16/2021   Cardiac murmur 12/16/2021   Obesity (BMI 30.0-34.9) 12/16/2021   Coronary artery calcification seen on CT scan 12/16/2021   Preventative health care 09/08/2021   Hyperlipidemia 09/08/2021   Amenorrhea 08/14/2021   Anxiety 08/14/2021   Chronic constipation 08/14/2021   Chronic headaches HISTORY OF MIGRAINES 08/14/2021   Complication of anesthesia 08/14/2021   HISTORY OF MIGRAINES 08/14/2021   OSTEOARTHRITIS 08/14/2021   Wears glasses for reading 08/14/2021   Chondromalacia, patella 06/30/2021   Family history of BRCA gene mutation 06/17/2021   Cyst of left ovary 06/17/2021   Vasomotor symptoms due to menopause 06/17/2021   Rectal bleeding 06/11/2021   Hot flashes 06/11/2021   Insomnia 08/19/2020   Gastroesophageal reflux disease 08/19/2020   Sacroiliac joint dysfunction of right side 12/19/2019   Degenerative disc disease, lumbar 09/04/2019   Psychogenic nonepileptic seizure 12/22/2016   Sicca syndrome (HCC) 11/22/2016   Raynaud's disease without gangrene 11/22/2016   History of Chiari malformation 11/22/2016   High risk medication use 11/22/2016   Other epilepsy, intractable, without status epilepticus (HCC) 11/01/2016   Arthralgia 08/18/2016   SLE (systemic lupus erythematosus) (HCC) 03/26/2016   Depression with  anxiety 03/26/2016   Transient alteration of awareness 01/01/2016   Chiari malformation 12/19/2015   Family history of breast cancer 09/09/2015   Past Medical History:  Diagnosis Date   Acute pulmonary embolism without acute cor pulmonale (HCC) 05/17/2022   Adnexal mass 02/24/2021   in epic seen on [place]   Amenorrhea 08/14/2021   Anxiety 08/14/2021   Arthralgia 08/18/2016   Back pain 09/04/2021   Blood in stool 09/08/2021   Cardiac murmur 12/16/2021   Chest discomfort 04/20/2022   Cholecystitis 05/26/2022   Chondromalacia, patella 06/30/2021   Chronic calculous cholecystitis 06/08/2022   Chronic constipation 08/14/2021   Chronic headaches HISTORY OF MIGRAINES 08/14/2021   Chronic migraine without aura, with intractable migraine, so stated, with status migrainosus 07/27/2017   Complication of anesthesia 08/14/2021   PONV   Constipation 06/11/2021   Coronary artery calcification seen on CT scan 12/16/2021   Cyst of left ovary 06/17/2021   Degenerative disc disease, lumbar 09/04/2019   Depression with anxiety 03/26/2016   Diarrhea 07/08/2017   DOE (dyspnea on exertion) 04/20/2022   Effusion, left knee 12/16/2021   Endometriosis    Family history of BRCA gene mutation 06/17/2021  [facility] genetic testing done 06/2021 - negative for breast cancer genes.   Family history of breast cancer 09/09/2015   Formatting of this note might be different from the original.  Mom dx'd age 47  Mat aunt dx'd age 76  Neither relative tested for BRCA  Formatting of this note might be different from the original.  Mom dx'd age 69  Mat aunt dx'd age 6  Neither relative tested for BRCA   Fatigue 03/26/2016   Former smoker 04/20/2022   Gastroesophageal reflux disease 08/19/2020   Hair loss 11/22/2016   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 (HCC)    High risk medication use 11/22/2016   history of Chiari malformation    History of Chiari malformation 11/22/2016   history of COVID 02/2021   LOW  GRADE FEVER CONGESTION AND HEADACHE ALL SYMPTOMS RESOLVED   Hot flashes 06/11/2021   Hyperlipidemia    Insomnia 08/19/2020   Low blood pressure    pt states history of low blood pressure-fainted 12/2010-instructed to move slowly   Nipple discharge 05/19/2022   Nonepileptic episode (HCC) 12/22/2016   Numbness and tingling 02/25/2016   Obesity (BMI 30.0-34.9) 12/16/2021   On continuous oral anticoagulation 05/17/2022   OSTEOARTHRITIS 08/14/2021   Other epilepsy, intractable, without status epilepticus (HCC) 11/01/2016   Pain in right knee 11/13/2020   Pain in unspecified joint 08/18/2016   Palpitations 12/15/2021   Pelvic pain in female 08/18/2021   Photosensitivity 11/22/2016   PONV (postoperative nausea and vomiting)    Preventative health care 09/08/2021   Psychogenic nonepileptic seizure 07/27/2017   Pulmonary embolism (HCC)    Raynaud's disease without gangrene 11/22/2016   Rectal bleeding 06/11/2021   RUQ pain 05/07/2022   Sacroiliac joint dysfunction of right side 12/19/2019   Sicca syndrome (HCC) 11/22/2016   SLE (systemic lupus erythematosus) (HCC) 03/26/2016   Status post laparoscopic cholecystectomy 06/08/2022   Subclinical hypothyroidism    Thyroid nodule    Transient alteration of awareness 01/01/2016   Vasomotor symptoms due to menopause 06/17/2021   Voice hoarseness 05/07/2022   Wears glasses for reading 08/14/2021   Family History  Problem Relation Age of Onset   Hypertension Mother    CAD Mother    Breast cancer Mother    Migraines Mother    Gout Mother    Heart disease Mother    Crohn's disease Mother    Hypertension Father    CAD Father    CVA Father    Heart disease Father    Colon polyps Father    Liver disease Father    Sjogren's syndrome Sister    Celiac disease Sister    Rheum arthritis Maternal Grandmother    Dementia Maternal Grandmother    Pancreatic cancer Maternal Grandfather    CAD Paternal Grandmother    Macular degeneration  Paternal Grandmother    CVA Paternal Grandfather    Autism Son    ADD / ADHD Son    Breast cancer Maternal Aunt    Esophageal cancer Neg Hx    Colon cancer Neg Hx    Stomach cancer Neg Hx    Past Surgical History:  Procedure Laterality Date   ABDOMINAL HYSTERECTOMY  2007   CHOLECYSTECTOMY N/A 06/08/2022   Procedure: LAPAROSCOPIC CHOLECYSTECTOMY WITH ICG;  Surgeon: Almond Lint, MD;  Location: [facility];  Service: General;  Laterality: N/A;   CYSTOSCOPY  2007   WITH HYSTERECTOMY   DIAGNOSTIC LAPAROSCOPY  2005   OOPHORECTOMY Left 08/18/2021   ROTATOR CUFF REPAIR Right  2010   SALPINGOOPHORECTOMY  04/16/2011   Procedure: SALPINGO OOPHERECTOMY;  Surgeon: Lenoard Aden, MD;  Location: [facility];  Service: Gynecology;  Laterality: Right;   skull decompression surgery 2005     @ [facility]   Social History   Occupational History   Occupation: nurse  Tobacco Use   Smoking status: Former    Packs/day: 1.00    Years: 10.00    Additional pack years: 0.00    Total pack years: 10.00    Types: Cigarettes    Quit date: 04/04/2004    Years since quitting: 18.3    Passive exposure: Past   Smokeless tobacco: Never  Vaping Use   Vaping Use: Never used  Substance and Sexual Activity   Alcohol use: Not Currently   Drug use: Never   Sexual activity: Yes    Birth control/protection: Surgical

## 2022-08-17 NOTE — Progress Notes (Unsigned)
Functional Pain Scale - descriptive words and definitions  Distressing (6)    Pain is present/unable to complete most ADLs limited by pain/sleep is difficult and active distraction is only marginal. Moderate range order  Average Pain 6  

## 2022-08-18 ENCOUNTER — Telehealth: Payer: Self-pay

## 2022-08-18 ENCOUNTER — Other Ambulatory Visit: Payer: Self-pay | Admitting: Physical Medicine and Rehabilitation

## 2022-08-18 ENCOUNTER — Ambulatory Visit: Payer: BC Managed Care – PPO | Attending: Physician Assistant | Admitting: Physical Therapy

## 2022-08-18 DIAGNOSIS — M5416 Radiculopathy, lumbar region: Secondary | ICD-10-CM | POA: Insufficient documentation

## 2022-08-18 DIAGNOSIS — R252 Cramp and spasm: Secondary | ICD-10-CM | POA: Diagnosis not present

## 2022-08-18 DIAGNOSIS — M5440 Lumbago with sciatica, unspecified side: Secondary | ICD-10-CM | POA: Insufficient documentation

## 2022-08-18 MED ORDER — DIAZEPAM 5 MG PO TABS: ORAL_TABLET | ORAL | 0 refills | Status: DC

## 2022-08-18 NOTE — Telephone Encounter (Signed)
Patient is scheduled for injection 08/30/22. Needs pre procedure Valium sent to [facility]

## 2022-08-18 NOTE — Therapy (Signed)
OUTPATIENT PHYSICAL THERAPY THORACOLUMBAR EVALUATION   Patient Name: Kim Pham MRN: [id number] DOB:March 17, 1981, 42 y.o., female Today's Date: 08/18/2022  END OF SESSION:  PT End of Session - 08/18/22 0846     Visit Number 1    Number of Visits 12    Date for PT Re-Evaluation 09/29/22    Authorization Type [employer]    PT Start Time 0805    PT Stop Time 0846    PT Time Calculation (min) 41 min    Activity Tolerance Patient tolerated treatment well    Behavior During Therapy [facility] for tasks assessed/performed             Past Medical History:  Diagnosis Date   Acute pulmonary embolism without acute cor pulmonale (HCC) 05/17/2022   Adnexal mass 02/24/2021   in epic seen on [place]   Amenorrhea 08/14/2021   Anxiety 08/14/2021   Arthralgia 08/18/2016   Back pain 09/04/2021   Blood in stool 09/08/2021   Cardiac murmur 12/16/2021   Chest discomfort 04/20/2022   Cholecystitis 05/26/2022   Chondromalacia, patella 06/30/2021   Chronic calculous cholecystitis 06/08/2022   Chronic constipation 08/14/2021   Chronic headaches HISTORY OF MIGRAINES 08/14/2021   Chronic migraine without aura, with intractable migraine, so stated, with status migrainosus 07/27/2017   Complication of anesthesia 08/14/2021   PONV   Constipation 06/11/2021   Coronary artery calcification seen on CT scan 12/16/2021   Cyst of left ovary 06/17/2021   Degenerative disc disease, lumbar 09/04/2019   Depression with anxiety 03/26/2016   Diarrhea 07/08/2017   DOE (dyspnea on exertion) 04/20/2022   Effusion, left knee 12/16/2021   Endometriosis    Family history of BRCA gene mutation 06/17/2021   [facility] genetic testing done 06/2021 - negative for breast cancer genes.   Family history of breast cancer 09/09/2015   Formatting of this note might be different from the original.  Mom dx'd age 64  Mat aunt dx'd age 62  Neither relative tested for BRCA  Formatting of this note might be different from the original.   Mom dx'd age 71  Mat aunt dx'd age 79  Neither relative tested for BRCA   Fatigue 03/26/2016   Former smoker 04/20/2022   Gastroesophageal reflux disease 08/19/2020   Hair loss 11/22/2016   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 (HCC)    High risk medication use 11/22/2016   history of Chiari malformation    History of Chiari malformation 11/22/2016   history of COVID 02/2021   LOW GRADE FEVER CONGESTION AND HEADACHE ALL SYMPTOMS RESOLVED   Hot flashes 06/11/2021   Hyperlipidemia    Insomnia 08/19/2020   Low blood pressure    pt states history of low blood pressure-fainted 12/2010-instructed to move slowly   Nipple discharge 05/19/2022   Nonepileptic episode (HCC) 12/22/2016   Numbness and tingling 02/25/2016   Obesity (BMI 30.0-34.9) 12/16/2021   On continuous oral anticoagulation 05/17/2022   OSTEOARTHRITIS 08/14/2021   Other epilepsy, intractable, without status epilepticus (HCC) 11/01/2016   Pain in right knee 11/13/2020   Pain in unspecified joint 08/18/2016   Palpitations 12/15/2021   Pelvic pain in female 08/18/2021   Photosensitivity 11/22/2016   PONV (postoperative nausea and vomiting)    Preventative health care 09/08/2021   Psychogenic nonepileptic seizure 07/27/2017   Pulmonary embolism (HCC)    Raynaud's disease without gangrene 11/22/2016   Rectal bleeding 06/11/2021   RUQ pain 05/07/2022   Sacroiliac joint dysfunction of right side 12/19/2019   Sicca syndrome (  HCC) 11/22/2016   SLE (systemic lupus erythematosus) (HCC) 03/26/2016   Status post laparoscopic cholecystectomy 06/08/2022   Subclinical hypothyroidism    Thyroid nodule    Transient alteration of awareness 01/01/2016   Vasomotor symptoms due to menopause 06/17/2021   Voice hoarseness 05/07/2022   Wears glasses for reading 08/14/2021   Past Surgical History:  Procedure Laterality Date   ABDOMINAL HYSTERECTOMY  2007   CHOLECYSTECTOMY N/A 06/08/2022   Procedure: LAPAROSCOPIC CHOLECYSTECTOMY  WITH ICG;  Surgeon: Almond Lint, MD;  Location: [facility];  Service: General;  Laterality: N/A;   CYSTOSCOPY  2007   WITH HYSTERECTOMY   DIAGNOSTIC LAPAROSCOPY  2005   OOPHORECTOMY Left 08/18/2021   ROTATOR CUFF REPAIR Right 2010   SALPINGOOPHORECTOMY  04/16/2011   Procedure: SALPINGO OOPHERECTOMY;  Surgeon: Lenoard Aden, MD;  Location: [facility];  Service: Gynecology;  Laterality: Right;   skull decompression surgery 2005     @ [facility]   Patient Active Problem List   Diagnosis Date Noted   Migraines 07/27/2022   Alcohol abuse 06/25/2022   Status post laparoscopic cholecystectomy 06/08/2022   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 (HCC) 05/27/2022   On continuous oral anticoagulation 05/17/2022   Pulmonary embolism (HCC) 05/11/2022   RUQ pain 05/07/2022   Voice hoarseness 05/07/2022   Thyroid nodule 05/04/2022   Former smoker 04/20/2022   Subclinical hypothyroidism 12/18/2021   Effusion, left knee 12/16/2021   Endometriosis 12/16/2021   Cardiac murmur 12/16/2021   Obesity (BMI 30.0-34.9) 12/16/2021   Coronary artery calcification seen on CT scan 12/16/2021   Preventative health care 09/08/2021   Hyperlipidemia 09/08/2021   Amenorrhea 08/14/2021   Anxiety 08/14/2021   Chronic constipation 08/14/2021   Chronic headaches HISTORY OF MIGRAINES 08/14/2021   Complication of anesthesia 08/14/2021   HISTORY OF MIGRAINES 08/14/2021   OSTEOARTHRITIS 08/14/2021   Wears glasses for reading 08/14/2021   Chondromalacia, patella 06/30/2021   Family history of BRCA gene mutation 06/17/2021   Cyst of left ovary 06/17/2021   Vasomotor symptoms due to menopause 06/17/2021   Rectal bleeding 06/11/2021   Hot flashes 06/11/2021   Insomnia 08/19/2020   Gastroesophageal reflux disease 08/19/2020   Sacroiliac joint dysfunction of right side 12/19/2019   Degenerative disc disease, lumbar 09/04/2019   Psychogenic nonepileptic seizure 12/22/2016   Sicca syndrome (HCC) 11/22/2016    Raynaud's disease without gangrene 11/22/2016   History of Chiari malformation 11/22/2016   High risk medication use 11/22/2016   Other epilepsy, intractable, without status epilepticus (HCC) 11/01/2016   Arthralgia 08/18/2016   SLE (systemic lupus erythematosus) (HCC) 03/26/2016   Depression with anxiety 03/26/2016   Transient alteration of awareness 01/01/2016   Chiari malformation 12/19/2015   Family history of breast cancer 09/09/2015    PCP: Sandford Craze, NP  REFERRING PROVIDER: Persons, [place], [place]   REFERRING DIAG:  M54.16 (ICD-10-CM) - Lumbar radiculopathy  M51.16 (ICD-10-CM) - Intervertebral disc disorders with radiculopathy, lumbar region  M47.816 (ICD-10-CM) - Facet arthropathy, lumbar    Rationale for Evaluation and Treatment: Rehabilitation  THERAPY DIAG:  Radiculopathy, lumbar region  Cramp and spasm  ONSET DATE: 4 weeks  prior to eval  SUBJECTIVE:  SUBJECTIVE STATEMENT: Had a fall 4 weeeks ago, trippled over dog, fell on tailbone, pain never went away, had chronic low bck pain, nurse, 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   PERTINENT HISTORY:  PE, Raynaud's, migraines, lupus, former smoker  PAIN:  Are you having pain? Yes: NPRS scale: 6/10 Pain location: right side back radiating down R leg to foot, numbness R thigh, R heel  Pain description: deep ache in back, throbbing pain in leg Aggravating factors: standing >10 min,  sitting > 1 hour if can  reposition in comfortable chair, driving too long, bending over and picking up anything heavy, sleeping on right side.  Relieving factors: heat pack, repositioning, percocet (takes if level 10)  PRECAUTIONS: None  WEIGHT BEARING RESTRICTIONS: No  FALLS:  Has patient fallen in last 6 months? Yes. Number of falls 1- dog tripped her.  Not normally off balance.   LIVING ENVIRONMENT: Lives with: lives with their spouse Lives in: House/apartment Stairs: Yes: Internal: 15 steps; on right going up to basement, difficulty going up Has following equipment at home: None  OCCUPATION: hospice nurse   PLOF: Independent and Leisure: walk 1-2 miles/day  PATIENT GOALS: avoid surgery, decrease pain  NEXT MD VISIT: not scheduled at this time, should be in couple of weeks.   OBJECTIVE:   DIAGNOSTIC FINDINGS:  MRI  lumbar spine 08/10/22 Narrative & Impression  FINDINGS: Segmentation: Standard. Lowest well-formed disc space labeled the L5-S1 level.   Alignment: Trace levoscoliosis. Alignment otherwise normal with preservation of the normal lumbar lordosis. No significant listhesis.   Vertebrae: Vertebral body height maintained without acute or chronic fracture. Bone marrow signal intensity within normal limits. No discrete or worrisome osseous lesions. No abnormal marrow edema.   Conus medullaris and cauda equina: Conus extends to the L2-3 level. Conus and cauda equina appear normal.   Paraspinal and other soft tissues: Unremarkable.   Disc levels:   L1-2: Mild disc bulge. Mild bilateral facet hypertrophy. No spinal stenosis. Foramina remain patent.   L2-3: Mild annular disc bulge. Mild right greater than left facet hypertrophy. No spinal stenosis. Foramina remain patent.   L3-4: Mild diffuse disc bulge. Superimposed left foraminal to extraforaminal disc protrusion contacts the exiting left L3 nerve root. Mild facet and ligament flavum hypertrophy. Resultant mild spinal stenosis.  Foramina remain adequately patent.   L4-5: Disc desiccation with mild disc bulge. Right foraminal to extraforaminal disc protrusion with annular fissure contacts the exiting right L4 nerve root (series 8, image 30). Mild to moderate bilateral facet hypertrophy. Resultant mild narrowing of the right lateral recess. Central canal remains patent. Mild bilateral L4 foraminal stenosis.   L5-S1: Disc desiccation with minimal annular disc bulge. No canal or lateral recess stenosis. Foramina remain patent.   IMPRESSION: 1. Left foraminal to extraforaminal disc protrusion at L3-4, contacting the exiting left L3 nerve root. 2. Right foraminal to extraforaminal disc protrusion at L4-5, contacting the exiting right L4 nerve root. 3. Lower lumbar facet hypertrophy, most pronounced at L3-4 and L4-5. Finding could contribute to lower back pain.    PATIENT SURVEYS:  Modified Oswestry 24/50 = 48%   SCREENING FOR RED FLAGS: Bowel or bladder incontinence: No  Spinal tumors: No Cauda equina syndrome: No Compression fracture: No Abdominal aneurysm: No  COGNITION: Overall cognitive status: Within functional limits for tasks assessed     SENSATION: Light touch: Impaired  25% diminished over R L3/4 dermatomes  MUSCLE LENGTH: Hamstrings: moderate tightness bilaterally  POSTURE: No Significant postural limitations  PALPATION: Tenderness with PA mobs L2-4, sacrum, R lumbar paraspinals, R glutes, R piriformis  LUMBAR ROM:   AROM eval  Flexion To mid shin limited by pain  Extension Limited 75%, p!  Right lateral flexion Past knee  Left lateral flexion To knee, p!  Right rotation Limited 25%, p!  Left rotation No limitation   (Blank rows = not tested)  LOWER EXTREMITY ROM:   WNL, symmetric bilaterally   LOWER EXTREMITY MMT:    MMT Right eval Left eval  Hip flexion 5 5  Hip extension 4 (limited by pain) 5  Hip abduction 5 5  Hip adduction 5 5  Knee flexion 5 5  Knee extension 5 5   Ankle dorsiflexion 5 5  Ankle plantarflexion 5 5   (Blank rows = not tested)  LUMBAR SPECIAL TESTS:  Straight leg raise test: Positive  FUNCTIONAL TESTS:   GAIT: Distance walked: 34' Assistive device utilized: None Level of assistance: Complete Independence Comments: WFL  TODAY'S TREATMENT:                                                                                                                              DATE:   08/18/22 Self Care: Education on exercises, goal to centralize symptoms, stop if exercises cause symptoms to peripheralize and inform PT, if symptoms centralize and pain slightly increases in low back this is ok.  Starting with prone lying x 3 min, then bending R leg x 10 to point of tightness not pain to floss nerve.     PATIENT EDUCATION:  Education details: findings, POC, initial HEP Person educated: Patient Education method: Explanation, Demonstration, Verbal cues, and Handouts Education comprehension: verbalized understanding and returned demonstration  HOME EXERCISE PROGRAM: Access Code: [email or web address] URL: [email or web address] Date: 08/18/2022 Prepared by: Kim Pham  Exercises - Prone Knee Flexion  - 3 x daily - 7 x weekly - 1 sets - 10 reps  ASSESSMENT:  CLINICAL IMPRESSION: Patient is a 42 y.o. female who was seen today for physical therapy evaluation and treatment for R lumbar radiculopathy.  Exam results were consistent with referral.  She demonstrates sensory changes in R L3-4 dermatome, but no strength deficits, and had positive straight leg raise test on R, with increased pain with both lumbar flexion, extension, R rotation and L SB.  Discussed that she is a good candidate for PT, started today with prone lying with knee bends, which she tolerated well, plan for neutral spine exercises and slow progression through mckenzie exercises to help centralize symptoms, education on stopping exercises that peripheralize symptoms, she may  also be good candidate for traction.  Kim Pham would  benefit from skilled physical therapy to decrease pain, centralize symptoms, and improve quality of life.    OBJECTIVE IMPAIRMENTS: decreased activity tolerance, decreased mobility, decreased ROM, increased fascial restrictions, increased muscle spasms, impaired flexibility, impaired sensation, and pain.   ACTIVITY LIMITATIONS: carrying, lifting, bending, sitting, standing, sleeping, stairs, transfers, bed mobility, locomotion level, and caring for others  PARTICIPATION LIMITATIONS: meal prep, cleaning, laundry, driving, shopping, community activity, and occupation  PERSONAL FACTORS: Profession and 3+ comorbidities: PE, Raynaud's, migraines, lupus, former smoker  are also affecting patient's functional outcome.   REHAB POTENTIAL: Good  CLINICAL DECISION MAKING: Evolving/moderate complexity  EVALUATION COMPLEXITY: Moderate   GOALS: Goals reviewed with patient? Yes  SHORT TERM GOALS: Target date: 09/01/2022  Patient will be independent with initial HEP.  Baseline: Goal status: INITIAL  2.  Patient will report centralization of radicular symptoms.  Baseline: radiating down RLE Goal status: INITIAL   LONG TERM GOALS: Target date: 09/29/2022    Patient will be independent with advanced/ongoing HEP to improve outcomes and carryover.  Baseline:  Goal status: INITIAL  2.  Patient will report 75% improvement in low back pain to improve QOL.  Baseline:  Goal status: INITIAL  3.  Patient will demonstrate full pain free lumbar ROM to perform ADLs.   Baseline: see objective Goal status: INITIAL  4.  Patient will report at least 6 points improvement on modified Oswestry to demonstrate improved functional ability.  Baseline: 24/50 Goal status: INITIAL   5.  Patient will tolerate 60 min of walking to exercise. Baseline: used to walk 1-2 miles/day Goal status: INITIAL  6.  Patient will be able to perform all job duties  without limitation from low back pain. 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  PLAN FOR NEXT SESSION: progress exercises focusing on mckenzie extension and neutral spine, trial traction, manual therapy, modalities PRN.    Jena Gauss, PT, DPT  08/18/2022, 7:24 PM

## 2022-08-20 ENCOUNTER — Telehealth: Payer: Self-pay | Admitting: Physician Assistant

## 2022-08-20 NOTE — Telephone Encounter (Signed)
Patient called. She would like a refill on percocet called in for her. Her cb# [phone]

## 2022-08-21 ENCOUNTER — Other Ambulatory Visit: Payer: Self-pay | Admitting: Family

## 2022-08-23 ENCOUNTER — Other Ambulatory Visit: Payer: Self-pay | Admitting: Physical Medicine and Rehabilitation

## 2022-08-23 ENCOUNTER — Ambulatory Visit: Payer: BC Managed Care – PPO

## 2022-08-23 DIAGNOSIS — M5416 Radiculopathy, lumbar region: Secondary | ICD-10-CM

## 2022-08-23 DIAGNOSIS — R252 Cramp and spasm: Secondary | ICD-10-CM

## 2022-08-23 DIAGNOSIS — M5440 Lumbago with sciatica, unspecified side: Secondary | ICD-10-CM | POA: Diagnosis not present

## 2022-08-23 MED ORDER — OXYCODONE-ACETAMINOPHEN 5-325 MG PO TABS: 1.0000 | ORAL_TABLET | Freq: Three times a day (TID) | ORAL | 0 refills | Status: DC | PRN

## 2022-08-23 NOTE — Therapy (Signed)
OUTPATIENT PHYSICAL THERAPY TREATMENT   Patient Name: Kim Pham MRN: [id number] DOB:Aug 05, 1980, 42 y.o., female Today's Date: 08/23/2022  END OF SESSION:  PT End of Session - 08/23/22 0810     Visit Number 2    Number of Visits 12    Date for PT Re-Evaluation 09/29/22    Authorization Type BCBS    PT Start Time 0804    PT Stop Time 0845    PT Time Calculation (min) 41 min    Activity Tolerance Patient tolerated treatment well    Behavior During Therapy [facility] for tasks assessed/performed              Past Medical History:  Diagnosis Date   Acute pulmonary embolism without acute cor pulmonale (HCC) 05/17/2022   Adnexal mass 02/24/2021   in epic seen on [place]   Amenorrhea 08/14/2021   Anxiety 08/14/2021   Arthralgia 08/18/2016   Back pain 09/04/2021   Blood in stool 09/08/2021   Cardiac murmur 12/16/2021   Chest discomfort 04/20/2022   Cholecystitis 05/26/2022   Chondromalacia, patella 06/30/2021   Chronic calculous cholecystitis 06/08/2022   Chronic constipation 08/14/2021   Chronic headaches HISTORY OF MIGRAINES 08/14/2021   Chronic migraine without aura, with intractable migraine, so stated, with status migrainosus 07/27/2017   Complication of anesthesia 08/14/2021   PONV   Constipation 06/11/2021   Coronary artery calcification seen on CT scan 12/16/2021   Cyst of left ovary 06/17/2021   Degenerative disc disease, lumbar 09/04/2019   Depression with anxiety 03/26/2016   Diarrhea 07/08/2017   DOE (dyspnea on exertion) 04/20/2022   Effusion, left knee 12/16/2021   Endometriosis    Family history of BRCA gene mutation 06/17/2021   [facility] genetic testing done 06/2021 - negative for breast cancer genes.   Family history of breast cancer 09/09/2015   Formatting of this note might be different from the original.  Mom dx'd age 74  Mat aunt dx'd age 43  Neither relative tested for BRCA  Formatting of this note might be different from the original.  Mom dx'd age 42   Mat aunt dx'd age 75  Neither relative tested for BRCA   Fatigue 03/26/2016   Former smoker 04/20/2022   Gastroesophageal reflux disease 08/19/2020   Hair loss 11/22/2016   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 (HCC)    High risk medication use 11/22/2016   history of Chiari malformation    History of Chiari malformation 11/22/2016   history of COVID 02/2021   LOW GRADE FEVER CONGESTION AND HEADACHE ALL SYMPTOMS RESOLVED   Hot flashes 06/11/2021   Hyperlipidemia    Insomnia 08/19/2020   Low blood pressure    pt states history of low blood pressure-fainted 12/2010-instructed to move slowly   Nipple discharge 05/19/2022   Nonepileptic episode (HCC) 12/22/2016   Numbness and tingling 02/25/2016   Obesity (BMI 30.0-34.9) 12/16/2021   On continuous oral anticoagulation 05/17/2022   OSTEOARTHRITIS 08/14/2021   Other epilepsy, intractable, without status epilepticus (HCC) 11/01/2016   Pain in right knee 11/13/2020   Pain in unspecified joint 08/18/2016   Palpitations 12/15/2021   Pelvic pain in female 08/18/2021   Photosensitivity 11/22/2016   PONV (postoperative nausea and vomiting)    Preventative health care 09/08/2021   Psychogenic nonepileptic seizure 07/27/2017   Pulmonary embolism (HCC)    Raynaud's disease without gangrene 11/22/2016   Rectal bleeding 06/11/2021   RUQ pain 05/07/2022   Sacroiliac joint dysfunction of right side 12/19/2019   Sicca syndrome (  HCC) 11/22/2016   SLE (systemic lupus erythematosus) (HCC) 03/26/2016   Status post laparoscopic cholecystectomy 06/08/2022   Subclinical hypothyroidism    Thyroid nodule    Transient alteration of awareness 01/01/2016   Vasomotor symptoms due to menopause 06/17/2021   Voice hoarseness 05/07/2022   Wears glasses for reading 08/14/2021   Past Surgical History:  Procedure Laterality Date   ABDOMINAL HYSTERECTOMY  2007   CHOLECYSTECTOMY N/A 06/08/2022   Procedure: LAPAROSCOPIC CHOLECYSTECTOMY WITH ICG;   Surgeon: Almond Lint, MD;  Location: [facility];  Service: General;  Laterality: N/A;   CYSTOSCOPY  2007   WITH HYSTERECTOMY   DIAGNOSTIC LAPAROSCOPY  2005   OOPHORECTOMY Left 08/18/2021   ROTATOR CUFF REPAIR Right 2010   SALPINGOOPHORECTOMY  04/16/2011   Procedure: SALPINGO OOPHERECTOMY;  Surgeon: Lenoard Aden, MD;  Location: [facility];  Service: Gynecology;  Laterality: Right;   skull decompression surgery 2005     @ [facility]   Patient Active Problem List   Diagnosis Date Noted   Migraines 07/27/2022   Alcohol abuse 06/25/2022   Status post laparoscopic cholecystectomy 06/08/2022   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 (HCC) 05/27/2022   On continuous oral anticoagulation 05/17/2022   Pulmonary embolism (HCC) 05/11/2022   RUQ pain 05/07/2022   Voice hoarseness 05/07/2022   Thyroid nodule 05/04/2022   Former smoker 04/20/2022   Subclinical hypothyroidism 12/18/2021   Effusion, left knee 12/16/2021   Endometriosis 12/16/2021   Cardiac murmur 12/16/2021   Obesity (BMI 30.0-34.9) 12/16/2021   Coronary artery calcification seen on CT scan 12/16/2021   Preventative health care 09/08/2021   Hyperlipidemia 09/08/2021   Amenorrhea 08/14/2021   Anxiety 08/14/2021   Chronic constipation 08/14/2021   Chronic headaches HISTORY OF MIGRAINES 08/14/2021   Complication of anesthesia 08/14/2021   HISTORY OF MIGRAINES 08/14/2021   OSTEOARTHRITIS 08/14/2021   Wears glasses for reading 08/14/2021   Chondromalacia, patella 06/30/2021   Family history of BRCA gene mutation 06/17/2021   Cyst of left ovary 06/17/2021   Vasomotor symptoms due to menopause 06/17/2021   Rectal bleeding 06/11/2021   Hot flashes 06/11/2021   Insomnia 08/19/2020   Gastroesophageal reflux disease 08/19/2020   Sacroiliac joint dysfunction of right side 12/19/2019   Degenerative disc disease, lumbar 09/04/2019   Psychogenic nonepileptic seizure 12/22/2016   Sicca syndrome (HCC) 11/22/2016   Raynaud's  disease without gangrene 11/22/2016   History of Chiari malformation 11/22/2016   High risk medication use 11/22/2016   Other epilepsy, intractable, without status epilepticus (HCC) 11/01/2016   Arthralgia 08/18/2016   SLE (systemic lupus erythematosus) (HCC) 03/26/2016   Depression with anxiety 03/26/2016   Transient alteration of awareness 01/01/2016   Chiari malformation 12/19/2015   Family history of breast cancer 09/09/2015    PCP: Sandford Craze, NP  REFERRING PROVIDER: Persons, [place], [place]   REFERRING DIAG:  M54.16 (ICD-10-CM) - Lumbar radiculopathy  M51.16 (ICD-10-CM) - Intervertebral disc disorders with radiculopathy, lumbar region  M47.816 (ICD-10-CM) - Facet arthropathy, lumbar    Rationale for Evaluation and Treatment: Rehabilitation  THERAPY DIAG:  Radiculopathy, lumbar region  Cramp and spasm  ONSET DATE: 4 weeks  prior to eval  SUBJECTIVE:  SUBJECTIVE STATEMENT: The pain is actually better today.   PERTINENT HISTORY:  PE, Raynaud's, migraines, lupus, former smoker  PAIN:  Are you having pain? Yes: NPRS scale: 5/10 Pain location: right side back radiating down R leg to foot, numbness R thigh, R heel  Pain description: deep ache in back, throbbing pain in leg Aggravating factors: standing >10 min,  sitting > 1 hour if can reposition in comfortable chair, driving too long, bending over and picking up anything heavy, sleeping on right side.  Relieving factors: heat pack, repositioning, percocet (takes if level 10)  PRECAUTIONS: None  WEIGHT BEARING RESTRICTIONS: No  FALLS:  Has patient fallen in last 6 months? Yes. Number of falls 1- dog tripped her.  Not normally off balance.   LIVING ENVIRONMENT: Lives with: lives with their spouse Lives in:  House/apartment Stairs: Yes: Internal: 15 steps; on right going up to basement, difficulty going up Has following equipment at home: None  OCCUPATION: hospice nurse   PLOF: Independent and Leisure: walk 1-2 miles/day  PATIENT GOALS: avoid surgery, decrease pain  NEXT MD VISIT: not scheduled at this time, should be in couple of weeks.   OBJECTIVE:   DIAGNOSTIC FINDINGS:  MRI  lumbar spine 08/10/22 Narrative & Impression  FINDINGS: Segmentation: Standard. Lowest well-formed disc space labeled the L5-S1 level.   Alignment: Trace levoscoliosis. Alignment otherwise normal with preservation of the normal lumbar lordosis. No significant listhesis.   Vertebrae: Vertebral body height maintained without acute or chronic fracture. Bone marrow signal intensity within normal limits. No discrete or worrisome osseous lesions. No abnormal marrow edema.   Conus medullaris and cauda equina: Conus extends to the L2-3 level. Conus and cauda equina appear normal.   Paraspinal and other soft tissues: Unremarkable.   Disc levels:   L1-2: Mild disc bulge. Mild bilateral facet hypertrophy. No spinal stenosis. Foramina remain patent.   L2-3: Mild annular disc bulge. Mild right greater than left facet hypertrophy. No spinal stenosis. Foramina remain patent.   L3-4: Mild diffuse disc bulge. Superimposed left foraminal to extraforaminal disc protrusion contacts the exiting left L3 nerve root. Mild facet and ligament flavum hypertrophy. Resultant mild spinal stenosis. Foramina remain adequately patent.   L4-5: Disc desiccation with mild disc bulge. Right foraminal to extraforaminal disc protrusion with annular fissure contacts the exiting right L4 nerve root (series 8, image 30). Mild to moderate bilateral facet hypertrophy. Resultant mild narrowing of the right lateral recess. Central canal remains patent. Mild bilateral L4 foraminal stenosis.   L5-S1: Disc desiccation with minimal annular  disc bulge. No canal or lateral recess stenosis. Foramina remain patent.   IMPRESSION: 1. Left foraminal to extraforaminal disc protrusion at L3-4, contacting the exiting left L3 nerve root. 2. Right foraminal to extraforaminal disc protrusion at L4-5, contacting the exiting right L4 nerve root. 3. Lower lumbar facet hypertrophy, most pronounced at L3-4 and L4-5. Finding could contribute to lower back pain.    PATIENT SURVEYS:  Modified Oswestry 24/50 = 48%   SCREENING FOR RED FLAGS: Bowel or bladder incontinence: No Spinal tumors: No Cauda equina syndrome: No Compression fracture: No Abdominal aneurysm: No  COGNITION: Overall cognitive status: Within functional limits for tasks assessed     SENSATION: Light touch: Impaired  25% diminished over R L3/4 dermatomes  MUSCLE LENGTH: Hamstrings: moderate tightness bilaterally  POSTURE: No Significant postural limitations  PALPATION: Tenderness with PA mobs L2-4, sacrum, R lumbar paraspinals, R glutes, R piriformis  LUMBAR ROM:   AROM eval  Flexion To mid  shin limited by pain  Extension Limited 75%, p!  Right lateral flexion Past knee  Left lateral flexion To knee, p!  Right rotation Limited 25%, p!  Left rotation No limitation   (Blank rows = not tested)  LOWER EXTREMITY ROM:   WNL, symmetric bilaterally   LOWER EXTREMITY MMT:    MMT Right eval Left eval  Hip flexion 5 5  Hip extension 4 (limited by pain) 5  Hip abduction 5 5  Hip adduction 5 5  Knee flexion 5 5  Knee extension 5 5  Ankle dorsiflexion 5 5  Ankle plantarflexion 5 5   (Blank rows = not tested)  LUMBAR SPECIAL TESTS:  Straight leg raise test: Positive  FUNCTIONAL TESTS:   GAIT: Distance walked: 14' Assistive device utilized: None Level of assistance: Complete Independence Comments: WFL  TODAY'S TREATMENT:                                                                                                                               DATE:  08/23/22 Therapeutic Exercise: to improve strength and mobility.   UBE L1.0 3 min fwd/ 3 min back Prone knee flexion x 10 B Prone on elbows x 1 min hold  Prone press up 5x5" Prone hip extension x 10 B Supine bridge green TB 2 x 10  Supine clams green TB 10x5" Seated piriformis and glute med stretch x 30 sec  each B Standing lumbar extension 10x3"  Manual Therapy: to decrease muscle spasm, pain and improve mobility IASTM with foam roll to lumbar paraspinals   08/18/22 Self Care: Education on exercises, goal to centralize symptoms, stop if exercises cause symptoms to peripheralize and inform PT, if symptoms centralize and pain slightly increases in low back this is ok.  Starting with prone lying x 3 min, then bending R leg x 10 to point of tightness not pain to floss nerve.     PATIENT EDUCATION:  Education details: findings, POC, initial HEP Person educated: Patient Education method: Explanation, Demonstration, Verbal cues, and Handouts Education comprehension: verbalized understanding and returned demonstration  HOME EXERCISE PROGRAM: Access Code: [email or web address] URL: [email or web address] Date: 08/23/2022 Prepared by: Verta Ellen  Exercises - Prone Knee Flexion  - 3 x daily - 7 x weekly - 1 sets - 10 reps - Prone Press Up On Elbows  - 1 x daily - 7 x weekly - 3 sets - 3 reps - 1  min hold - Prone Press Up  - 1 x daily - 7 x weekly - 3 sets - 5 reps - 5- 10 second hold - Supine Bridge with Resistance Band  - 1 x daily - 7 x weekly - 3 sets - 10 reps - Seated Piriformis Stretch with Trunk Bend  - 1 x daily - 7 x weekly - 3 reps - 30 second hold  ASSESSMENT:  CLINICAL IMPRESSION: Pt demonstrated a good response to the initial progression of exercises. She noted tightness  in her low back after the exercises today. Focused on extension based exercises to reduce radicular sx and pain. Cues required to avoid rotating pelvis with prone hip extension. She would  continue to benefit from skilled therapy.   OBJECTIVE IMPAIRMENTS: decreased activity tolerance, decreased mobility, decreased ROM, increased fascial restrictions, increased muscle spasms, impaired flexibility, impaired sensation, and pain.   ACTIVITY LIMITATIONS: carrying, lifting, bending, sitting, standing, sleeping, stairs, transfers, bed mobility, locomotion level, and caring for others  PARTICIPATION LIMITATIONS: meal prep, cleaning, laundry, driving, shopping, community activity, and occupation  PERSONAL FACTORS: Profession and 3+ comorbidities: PE, Raynaud's, migraines, lupus, former smoker  are also affecting patient's functional outcome.   REHAB POTENTIAL: Good  CLINICAL DECISION MAKING: Evolving/moderate complexity  EVALUATION COMPLEXITY: Moderate   GOALS: Goals reviewed with patient? Yes  SHORT TERM GOALS: Target date: 09/01/2022  Patient will be independent with initial HEP.  Baseline: Goal status: IN PROGRESS  2.  Patient will report centralization of radicular symptoms.  Baseline: radiating down RLE Goal status: IN PROGRESS   LONG TERM GOALS: Target date: 09/29/2022    Patient will be independent with advanced/ongoing HEP to improve outcomes and carryover.  Baseline:  Goal status: IN PROGRESS  2.  Patient will report 75% improvement in low back pain to improve QOL.  Baseline:  Goal status: IN PROGRESS  3.  Patient will demonstrate full pain free lumbar ROM to perform ADLs.   Baseline: see objective Goal status: IN PROGRESS  4.  Patient will report at least 6 points improvement on modified Oswestry to demonstrate improved functional ability.  Baseline: 24/50 Goal status: IN PROGRESS   5.  Patient will tolerate 60 min of walking to exercise. Baseline: used to walk 1-2 miles/day Goal status: IN PROGRESS  6.  Patient will be able to perform all job duties without limitation from low back pain. 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  PLAN FOR NEXT SESSION: progress exercises focusing on mckenzie extension and neutral spine, trial traction, manual therapy, modalities PRN.    Darleene Cleaver, PTA 08/23/2022, 8:47 AM

## 2022-08-24 ENCOUNTER — Other Ambulatory Visit: Payer: Self-pay | Admitting: Physical Medicine and Rehabilitation

## 2022-08-24 MED ORDER — OXYCODONE-ACETAMINOPHEN 5-325 MG PO TABS: 1.0000 | ORAL_TABLET | Freq: Three times a day (TID) | ORAL | 0 refills | Status: DC | PRN

## 2022-08-24 NOTE — Therapy (Signed)
OUTPATIENT PHYSICAL THERAPY TREATMENT   Patient Name: Kim Pham MRN: [id number] DOB:[birth year], 42 y.o., female Today's Date: 08/25/2022  END OF SESSION:  PT End of Session - 08/25/22 0755     Visit Number 3    Number of Visits 12    Date for PT Re-Evaluation 09/29/22    Authorization Type BCBS    PT Start Time 0800    PT Stop Time 0847    PT Time Calculation (min) 47 min    Activity Tolerance Patient tolerated treatment well    Behavior During Therapy [facility] for tasks assessed/performed              Past Medical History:  Diagnosis Date   Acute pulmonary embolism without acute cor pulmonale (HCC) 05/17/2022   Adnexal mass 02/24/2021   in epic seen on [place]   Amenorrhea 08/14/2021   Anxiety 08/14/2021   Arthralgia 08/18/2016   Back pain 09/04/2021   Blood in stool 09/08/2021   Cardiac murmur 12/16/2021   Chest discomfort 04/20/2022   Cholecystitis 05/26/2022   Chondromalacia, patella 06/30/2021   Chronic calculous cholecystitis 06/08/2022   Chronic constipation 08/14/2021   Chronic headaches HISTORY OF MIGRAINES 08/14/2021   Chronic migraine without aura, with intractable migraine, so stated, with status migrainosus 07/27/2017   Complication of anesthesia 08/14/2021   PONV   Constipation 06/11/2021   Coronary artery calcification seen on CT scan 12/16/2021   Cyst of left ovary 06/17/2021   Degenerative disc disease, lumbar 09/04/2019   Depression with anxiety 03/26/2016   Diarrhea 07/08/2017   DOE (dyspnea on exertion) 04/20/2022   Effusion, left knee 12/16/2021   Endometriosis    Family history of BRCA gene mutation 06/17/2021   [facility] genetic testing done 06/2021 - negative for breast cancer genes.   Family history of breast cancer 09/09/2015   Formatting of this note might be different from the original.  Mom dx'd age 73  Mat aunt dx'd age 41  Neither relative tested for BRCA  Formatting of this note might be different from the original.  Mom dx'd age 35   Mat aunt dx'd age 79  Neither relative tested for BRCA   Fatigue 03/26/2016   Former smoker 04/20/2022   Gastroesophageal reflux disease 08/19/2020   Hair loss 11/22/2016   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 (HCC)    High risk medication use 11/22/2016   history of Chiari malformation    History of Chiari malformation 11/22/2016   history of COVID 02/2021   LOW GRADE FEVER CONGESTION AND HEADACHE ALL SYMPTOMS RESOLVED   Hot flashes 06/11/2021   Hyperlipidemia    Insomnia 08/19/2020   Low blood pressure    pt states history of low blood pressure-fainted 12/2010-instructed to move slowly   Nipple discharge 05/19/2022   Nonepileptic episode (HCC) 12/22/2016   Numbness and tingling 02/25/2016   Obesity (BMI 30.0-34.9) 12/16/2021   On continuous oral anticoagulation 05/17/2022   OSTEOARTHRITIS 08/14/2021   Other epilepsy, intractable, without status epilepticus (HCC) 11/01/2016   Pain in right knee 11/13/2020   Pain in unspecified joint 08/18/2016   Palpitations 12/15/2021   Pelvic pain in female 08/18/2021   Photosensitivity 11/22/2016   PONV (postoperative nausea and vomiting)    Preventative health care 09/08/2021   Psychogenic nonepileptic seizure 07/27/2017   Pulmonary embolism (HCC)    Raynaud's disease without gangrene 11/22/2016   Rectal bleeding 06/11/2021   RUQ pain 05/07/2022   Sacroiliac joint dysfunction of right side 12/19/2019   Sicca syndrome (  HCC) 11/22/2016   SLE (systemic lupus erythematosus) (HCC) 03/26/2016   Status post laparoscopic cholecystectomy 06/08/2022   Subclinical hypothyroidism    Thyroid nodule    Transient alteration of awareness 01/01/2016   Vasomotor symptoms due to menopause 06/17/2021   Voice hoarseness 05/07/2022   Wears glasses for reading 08/14/2021   Past Surgical History:  Procedure Laterality Date   ABDOMINAL HYSTERECTOMY  2007   CHOLECYSTECTOMY N/A 06/08/2022   Procedure: LAPAROSCOPIC CHOLECYSTECTOMY WITH ICG;   Surgeon: Almond Lint, MD;  Location: [facility];  Service: General;  Laterality: N/A;   CYSTOSCOPY  2007   WITH HYSTERECTOMY   DIAGNOSTIC LAPAROSCOPY  2005   OOPHORECTOMY Left 08/18/2021   ROTATOR CUFF REPAIR Right 2010   SALPINGOOPHORECTOMY  04/16/2011   Procedure: SALPINGO OOPHERECTOMY;  Surgeon: Lenoard Aden, MD;  Location: [facility];  Service: Gynecology;  Laterality: Right;   skull decompression surgery 2005     @ [facility]   Patient Active Problem List   Diagnosis Date Noted   Migraines 07/27/2022   Alcohol abuse 06/25/2022   Status post laparoscopic cholecystectomy 06/08/2022   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 (HCC) 05/27/2022   On continuous oral anticoagulation 05/17/2022   Pulmonary embolism (HCC) 05/11/2022   RUQ pain 05/07/2022   Voice hoarseness 05/07/2022   Thyroid nodule 05/04/2022   Former smoker 04/20/2022   Subclinical hypothyroidism 12/18/2021   Effusion, left knee 12/16/2021   Endometriosis 12/16/2021   Cardiac murmur 12/16/2021   Obesity (BMI 30.0-34.9) 12/16/2021   Coronary artery calcification seen on CT scan 12/16/2021   Preventative health care 09/08/2021   Hyperlipidemia 09/08/2021   Amenorrhea 08/14/2021   Anxiety 08/14/2021   Chronic constipation 08/14/2021   Chronic headaches HISTORY OF MIGRAINES 08/14/2021   Complication of anesthesia 08/14/2021   HISTORY OF MIGRAINES 08/14/2021   OSTEOARTHRITIS 08/14/2021   Wears glasses for reading 08/14/2021   Chondromalacia, patella 06/30/2021   Family history of BRCA gene mutation 06/17/2021   Cyst of left ovary 06/17/2021   Vasomotor symptoms due to menopause 06/17/2021   Rectal bleeding 06/11/2021   Hot flashes 06/11/2021   Insomnia 08/19/2020   Gastroesophageal reflux disease 08/19/2020   Sacroiliac joint dysfunction of right side 12/19/2019   Degenerative disc disease, lumbar 09/04/2019   Psychogenic nonepileptic seizure 12/22/2016   Sicca syndrome (HCC) 11/22/2016   Raynaud's  disease without gangrene 11/22/2016   History of Chiari malformation 11/22/2016   High risk medication use 11/22/2016   Other epilepsy, intractable, without status epilepticus (HCC) 11/01/2016   Arthralgia 08/18/2016   SLE (systemic lupus erythematosus) (HCC) 03/26/2016   Depression with anxiety 03/26/2016   Transient alteration of awareness 01/01/2016   Chiari malformation 12/19/2015   Family history of breast cancer 09/09/2015    PCP: Sandford Craze, NP  REFERRING PROVIDER: Persons, [place], [place]   REFERRING DIAG:  M54.16 (ICD-10-CM) - Lumbar radiculopathy  M51.16 (ICD-10-CM) - Intervertebral disc disorders with radiculopathy, lumbar region  M47.816 (ICD-10-CM) - Facet arthropathy, lumbar    Rationale for Evaluation and Treatment: Rehabilitation  THERAPY DIAG:  Radiculopathy, lumbar region  Cramp and spasm  ONSET DATE: 4 weeks  prior to eval  SUBJECTIVE:  SUBJECTIVE STATEMENT: Feeling pain in R post leg to knee today and in bil low back up to ribs Had to carry something awkward yesterday which messed it up some. Standing for a long time irritates it.  PERTINENT HISTORY:  PE, Raynaud's, migraines, lupus, former smoker  PAIN:  Are you having pain? Yes: NPRS scale: 7/10 Pain location: right side back radiating down R leg to foot, numbness R thigh, R heel  Pain description: deep ache in back, throbbing pain in leg Aggravating factors: standing >10 min,  sitting > 1 hour if can reposition in comfortable chair, driving too long, bending over and picking up anything heavy, sleeping on right side.  Relieving factors: heat pack, repositioning, percocet (takes if level 10)  PRECAUTIONS: None  WEIGHT BEARING RESTRICTIONS: No  FALLS:  Has patient fallen in last 6 months? Yes. Number of  falls 1- dog tripped her.  Not normally off balance.   LIVING ENVIRONMENT: Lives with: lives with their spouse Lives in: House/apartment Stairs: Yes: Internal: 15 steps; on right going up to basement, difficulty going up Has following equipment at home: None  OCCUPATION: hospice nurse   PLOF: Independent and Leisure: walk 1-2 miles/day  PATIENT GOALS: avoid surgery, decrease pain  NEXT MD VISIT: not scheduled at this time, should be in couple of weeks.   OBJECTIVE:   DIAGNOSTIC FINDINGS:  MRI  lumbar spine 08/10/22 Narrative & Impression  FINDINGS: Segmentation: Standard. Lowest well-formed disc space labeled the L5-S1 level.   Alignment: Trace levoscoliosis. Alignment otherwise normal with preservation of the normal lumbar lordosis. No significant listhesis.   Vertebrae: Vertebral body height maintained without acute or chronic fracture. Bone marrow signal intensity within normal limits. No discrete or worrisome osseous lesions. No abnormal marrow edema.   Conus medullaris and cauda equina: Conus extends to the L2-3 level. Conus and cauda equina appear normal.   Paraspinal and other soft tissues: Unremarkable.   Disc levels:   L1-2: Mild disc bulge. Mild bilateral facet hypertrophy. No spinal stenosis. Foramina remain patent.   L2-3: Mild annular disc bulge. Mild right greater than left facet hypertrophy. No spinal stenosis. Foramina remain patent.   L3-4: Mild diffuse disc bulge. Superimposed left foraminal to extraforaminal disc protrusion contacts the exiting left L3 nerve root. Mild facet and ligament flavum hypertrophy. Resultant mild spinal stenosis. Foramina remain adequately patent.   L4-5: Disc desiccation with mild disc bulge. Right foraminal to extraforaminal disc protrusion with annular fissure contacts the exiting right L4 nerve root (series 8, image 30). Mild to moderate bilateral facet hypertrophy. Resultant mild narrowing of the right lateral  recess. Central canal remains patent. Mild bilateral L4 foraminal stenosis.   L5-S1: Disc desiccation with minimal annular disc bulge. No canal or lateral recess stenosis. Foramina remain patent.   IMPRESSION: 1. Left foraminal to extraforaminal disc protrusion at L3-4, contacting the exiting left L3 nerve root. 2. Right foraminal to extraforaminal disc protrusion at L4-5, contacting the exiting right L4 nerve root. 3. Lower lumbar facet hypertrophy, most pronounced at L3-4 and L4-5. Finding could contribute to lower back pain.    PATIENT SURVEYS:  Modified Oswestry 24/50 = 48%   SCREENING FOR RED FLAGS: Bowel or bladder incontinence: No Spinal tumors: No Cauda equina syndrome: No Compression fracture: No Abdominal aneurysm: No  COGNITION: Overall cognitive status: Within functional limits for tasks assessed     SENSATION: Light touch: Impaired  25% diminished over R L3/4 dermatomes  MUSCLE LENGTH: Hamstrings: moderate tightness bilaterally  POSTURE: No Significant  postural limitations  PALPATION: Tenderness with PA mobs L2-4, sacrum, R lumbar paraspinals, R glutes, R piriformis  LUMBAR ROM:   AROM eval  Flexion To mid shin limited by pain  Extension Limited 75%, p!  Right lateral flexion Past knee  Left lateral flexion To knee, p!  Right rotation Limited 25%, p!  Left rotation No limitation   (Blank rows = not tested)  LOWER EXTREMITY ROM:   WNL, symmetric bilaterally   LOWER EXTREMITY MMT:    MMT Right eval Left eval  Hip flexion 5 5  Hip extension 4 (limited by pain) 5  Hip abduction 5 5  Hip adduction 5 5  Knee flexion 5 5  Knee extension 5 5  Ankle dorsiflexion 5 5  Ankle plantarflexion 5 5   (Blank rows = not tested)  LUMBAR SPECIAL TESTS:  Straight leg raise test: Positive  FUNCTIONAL TESTS:   GAIT: Distance walked: 61' Assistive device utilized: None Level of assistance: Complete Independence Comments: WFL  TODAY'S TREATMENT:                                                                                                                               DATE:  08/25/22 Bike L3 x 5 min Prone lying centralizes pain almost immediately pain 4-5/10 Prone press ups x 10 no change, x 10 with exhale on updog - increased R leg pain to shin 5/10 Prone over one pillow x 1 min centralizes pain again Prone knee flexion with pelvic press x 10 ea  Manual: gentle PA mobs lumbar spine gd II; TPR in L S/L to R QL, STM to R lumbar Self MFR with ball to lumbar gluteals Manual traction in hooklying with belt x 20 sec tried to assess tolerance to mechanical tx.  Modalities: supine Mechanical lumbar traction Intermittent 60/20 75#/50# x 15 min (wt 205#)   08/23/22 Therapeutic Exercise: to improve strength and mobility.   UBE L1.0 3 min fwd/ 3 min back Prone knee flexion x 10 B Prone on elbows x 1 min hold  Prone press up 5x5" Prone hip extension x 10 B Supine bridge green TB 2 x 10  Supine clams green TB 10x5" Seated piriformis and glute med stretch x 30 sec  each B Standing lumbar extension 10x3"  Manual Therapy: to decrease muscle spasm, pain and improve mobility IASTM with foam roll to lumbar paraspinals   08/18/22 Self Care: Education on exercises, goal to centralize symptoms, stop if exercises cause symptoms to peripheralize and inform PT, if symptoms centralize and pain slightly increases in low back this is ok.  Starting with prone lying x 3 min, then bending R leg x 10 to point of tightness not pain to floss nerve.     PATIENT EDUCATION:  Education details: findings, POC, initial HEP Person educated: Patient Education method: Explanation, Demonstration, Verbal cues, and Handouts Education comprehension: verbalized understanding and returned demonstration  HOME EXERCISE PROGRAM: Access Code: [email or web address] URL:  [email or web address] Date: 08/25/2022 Prepared by: Raynelle Fanning  Exercises - Prone Knee Flexion  - 3 x daily -  7 x weekly - 1 sets - 10 reps - Prone Press Up On Elbows  - 1 x daily - 7 x weekly - 3 sets - 3 reps - 1  min hold - Prone Press Up  - 1 x daily - 7 x weekly - 3 sets - 5 reps - 5- 10 second hold - Supine Bridge with Resistance Band  - 1 x daily - 7 x weekly - 3 sets - 10 reps - Seated Piriformis Stretch with Trunk Bend  - 1 x daily - 7 x weekly - 3 reps - 30 second hold - Standing Quadratus Lumborum Mobilization with Small Ball on Wall  - 1 x daily - 7 x weekly - 3 sets - 10 reps  Patient Education - Posture and Body Mechanics  ASSESSMENT:  CLINICAL IMPRESSION: Deede reports increased pain today in back and R LE after carrying something awkward yesterday. She was able to centralize sx with prone lying but extensions increased radicular sx today. Initial trial of intermittent traction done today with good response. Basic ADL modifications and body mechanics for lifting were discussed with patient and should be reviewed next visit. Handouts provided. She will benefit from standing core to assist with stabilizing at work. Aireana continues to demonstrate potential for improvement and would benefit from continued skilled therapy to address impairments.    OBJECTIVE IMPAIRMENTS: decreased activity tolerance, decreased mobility, decreased ROM, increased fascial restrictions, increased muscle spasms, impaired flexibility, impaired sensation, and pain.   ACTIVITY LIMITATIONS: carrying, lifting, bending, sitting, standing, sleeping, stairs, transfers, bed mobility, locomotion level, and caring for others  PARTICIPATION LIMITATIONS: meal prep, cleaning, laundry, driving, shopping, community activity, and occupation  PERSONAL FACTORS: Profession and 3+ comorbidities: PE, Raynaud's, migraines, lupus, former smoker  are also affecting patient's functional outcome.   REHAB POTENTIAL: Good  CLINICAL DECISION MAKING: Evolving/moderate complexity  EVALUATION COMPLEXITY: Moderate   GOALS: Goals  reviewed with patient? Yes  SHORT TERM GOALS: Target date: 09/01/2022  Patient will be independent with initial HEP.  Baseline: Goal status: IN PROGRESS  2.  Patient will report centralization of radicular symptoms.  Baseline: radiating down RLE Goal status: IN PROGRESS   LONG TERM GOALS: Target date: 09/29/2022    Patient will be independent with advanced/ongoing HEP to improve outcomes and carryover.  Baseline:  Goal status: IN PROGRESS  2.  Patient will report 75% improvement in low back pain to improve QOL.  Baseline:  Goal status: IN PROGRESS  3.  Patient will demonstrate full pain free lumbar ROM to perform ADLs.   Baseline: see objective Goal status: IN PROGRESS  4.  Patient will report at least 6 points improvement on modified Oswestry to demonstrate improved functional ability.  Baseline: 24/50 Goal status: IN PROGRESS   5.  Patient will tolerate 60 min of walking to exercise. Baseline: used to walk 1-2 miles/day Goal status: IN PROGRESS  6.  Patient will be able to perform all job duties without limitation from low back pain. 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  PLAN FOR NEXT SESSION: progress  exercises focusing on mckenzie extension and neutral spine, trial traction, manual therapy, modalities PRN.    Merranda Bolls, PT 08/25/2022, 8:46 AM

## 2022-08-25 ENCOUNTER — Encounter: Payer: Self-pay | Admitting: Physical Therapy

## 2022-08-25 ENCOUNTER — Ambulatory Visit: Payer: BC Managed Care – PPO | Admitting: Physical Therapy

## 2022-08-25 DIAGNOSIS — M5416 Radiculopathy, lumbar region: Secondary | ICD-10-CM

## 2022-08-25 DIAGNOSIS — R252 Cramp and spasm: Secondary | ICD-10-CM

## 2022-08-25 DIAGNOSIS — M5440 Lumbago with sciatica, unspecified side: Secondary | ICD-10-CM | POA: Diagnosis not present

## 2022-08-26 ENCOUNTER — Ambulatory Visit: Payer: BC Managed Care – PPO | Admitting: Behavioral Health

## 2022-08-30 ENCOUNTER — Other Ambulatory Visit: Payer: Self-pay

## 2022-08-30 ENCOUNTER — Ambulatory Visit (INDEPENDENT_AMBULATORY_CARE_PROVIDER_SITE_OTHER): Payer: BC Managed Care – PPO | Admitting: Physical Medicine and Rehabilitation

## 2022-08-30 VITALS — BP 101/70 | HR 103

## 2022-08-30 DIAGNOSIS — M5416 Radiculopathy, lumbar region: Secondary | ICD-10-CM | POA: Diagnosis not present

## 2022-08-30 MED ORDER — METHYLPREDNISOLONE ACETATE 80 MG/ML IJ SUSP
80.0000 mg | Freq: Once | INTRAMUSCULAR | Status: AC
Start: 2022-08-30 — End: 2022-08-30
  Administered 2022-08-30: 80 mg

## 2022-08-30 NOTE — Progress Notes (Unsigned)
Functional Pain Scale - descriptive words and definitions  Distracting (5)    Aware of pain/able to complete some ADL's but limited by pain/sleep is affected and active distractions are only slightly useful. Moderate range order  Average Pain 6   +Driver, -BT, -Dye Allergies.  Lower back pain on right that radiates into right leg. Rest and OTC pain medication helps pain

## 2022-08-30 NOTE — Patient Instructions (Signed)

## 2022-09-01 ENCOUNTER — Encounter (HOSPITAL_BASED_OUTPATIENT_CLINIC_OR_DEPARTMENT_OTHER): Payer: Self-pay | Admitting: Emergency Medicine

## 2022-09-01 ENCOUNTER — Emergency Department (HOSPITAL_BASED_OUTPATIENT_CLINIC_OR_DEPARTMENT_OTHER): Payer: BC Managed Care – PPO

## 2022-09-01 ENCOUNTER — Ambulatory Visit: Payer: BC Managed Care – PPO | Admitting: Physical Therapy

## 2022-09-01 ENCOUNTER — Other Ambulatory Visit: Payer: Self-pay

## 2022-09-01 ENCOUNTER — Other Ambulatory Visit: Payer: Self-pay | Admitting: Physical Medicine and Rehabilitation

## 2022-09-01 ENCOUNTER — Emergency Department (HOSPITAL_BASED_OUTPATIENT_CLINIC_OR_DEPARTMENT_OTHER)
Admission: EM | Admit: 2022-09-01 | Discharge: 2022-09-01 | Disposition: A | Discharge status: Home / Self Care | Payer: BC Managed Care – PPO | Attending: Emergency Medicine | Admitting: Emergency Medicine

## 2022-09-01 ENCOUNTER — Encounter: Payer: Self-pay | Admitting: Physical Medicine and Rehabilitation

## 2022-09-01 DIAGNOSIS — R079 Chest pain, unspecified: Secondary | ICD-10-CM | POA: Diagnosis not present

## 2022-09-01 DIAGNOSIS — Z7901 Long term (current) use of anticoagulants: Secondary | ICD-10-CM | POA: Insufficient documentation

## 2022-09-01 DIAGNOSIS — M5416 Radiculopathy, lumbar region: Secondary | ICD-10-CM | POA: Diagnosis not present

## 2022-09-01 DIAGNOSIS — R0789 Other chest pain: Secondary | ICD-10-CM | POA: Diagnosis not present

## 2022-09-01 LAB — COMPREHENSIVE METABOLIC PANEL
ALT: 28 U/L (ref 0–44)
AST: 20 U/L (ref 15–41)
Albumin: 4.5 g/dL (ref 3.5–5.0)
Alkaline Phosphatase: 60 U/L (ref 38–126)
Anion gap: 13 (ref 5–15)
BUN: 16 mg/dL (ref 6–20)
CO2: 24 mmol/L (ref 22–32)
Calcium: 9.5 mg/dL (ref 8.9–10.3)
Chloride: 99 mmol/L (ref 98–111)
Creatinine, Ser: 0.77 mg/dL (ref 0.44–1.00)
GFR, Estimated: >60 mL/min (ref >60)
Glucose, Bld: 101 mg/dL — ABNORMAL HIGH (ref 70–99)
Potassium: 3.4 mmol/L — ABNORMAL LOW (ref 3.5–5.1)
Sodium: 136 mmol/L (ref 135–145)
Total Bilirubin: 0.6 mg/dL (ref 0.3–1.2)
Total Protein: 7.4 g/dL (ref 6.5–8.1)

## 2022-09-01 LAB — CBC WITH DIFFERENTIAL/PLATELET
Abs Immature Granulocytes: 0.02 10*3/uL (ref 0.00–0.07)
Basophils Absolute: 0 10*3/uL (ref 0.0–0.1)
Basophils Relative: 1 %
Eosinophils Absolute: 0.1 10*3/uL (ref 0.0–0.5)
Eosinophils Relative: 1 %
HCT: 38.9 % (ref 36.0–46.0)
Hemoglobin: 12.8 g/dL (ref 12.0–15.0)
Immature Granulocytes: 0 %
Lymphocytes Relative: 29 %
Lymphs Abs: 1.9 10*3/uL (ref 0.7–4.0)
MCH: 28.4 pg (ref 26.0–34.0)
MCHC: 32.9 g/dL (ref 30.0–36.0)
MCV: 86.4 fL (ref 80.0–100.0)
Monocytes Absolute: 0.4 10*3/uL (ref 0.1–1.0)
Monocytes Relative: 6 %
Neutro Abs: 4.1 10*3/uL (ref 1.7–7.7)
Neutrophils Relative %: 63 %
Platelets: 247 10*3/uL (ref 150–400)
RBC: 4.5 MIL/uL (ref 3.87–5.11)
RDW: 12.9 % (ref 11.5–15.5)
WBC: 6.5 10*3/uL (ref 4.0–10.5)
nRBC: 0 % (ref 0.0–0.2)

## 2022-09-01 LAB — TROPONIN I (HIGH SENSITIVITY): Troponin I (High Sensitivity): 4 ng/L (ref <18)

## 2022-09-01 LAB — D-DIMER, QUANTITATIVE: D-Dimer, Quant: <0.27 ug/mL-FEU (ref 0.00–0.50)

## 2022-09-01 LAB — LIPASE, BLOOD: Lipase: 66 U/L — ABNORMAL HIGH (ref 11–51)

## 2022-09-01 MED ORDER — FENTANYL CITRATE PF 50 MCG/ML IJ SOSY
50.0000 ug | PREFILLED_SYRINGE | Freq: Once | INTRAMUSCULAR | Status: AC
  Administered 2022-09-01: 50 ug via INTRAVENOUS
  Filled 2022-09-01: qty 1

## 2022-09-01 MED ORDER — OXYCODONE HCL 5 MG PO TABS
5.0000 mg | ORAL_TABLET | Freq: Once | ORAL | Status: AC
  Administered 2022-09-01: 5 mg via ORAL
  Filled 2022-09-01: qty 1

## 2022-09-01 MED ORDER — OXYCODONE-ACETAMINOPHEN 5-325 MG PO TABS: 1.0000 | ORAL_TABLET | Freq: Three times a day (TID) | ORAL | 0 refills | Status: DC | PRN

## 2022-09-01 MED ORDER — ALUM & MAG HYDROXIDE-SIMETH 200-200-20 MG/5ML PO SUSP
30.0000 mL | Freq: Once | ORAL | Status: AC
  Administered 2022-09-01: 30 mL via ORAL
  Filled 2022-09-01: qty 30

## 2022-09-01 NOTE — Procedures (Signed)
Lumbosacral Transforaminal Epidural Steroid Injection - Sub-Pedicular Approach with Fluoroscopic Guidance  Patient: Kim Pham      Date of Birth: Aug 27, 1980 MRN: [id number] PCP: Sandford Craze, NP      Visit Date: 08/30/2022   Universal Protocol:    Date/Time: 08/30/2022  Consent Given By: the patient  Position: PRONE  Additional Comments: Vital signs were monitored before and after the procedure. Patient was prepped and draped in the usual sterile fashion. The correct patient, procedure, and site was verified.   Injection Procedure Details:   Procedure diagnoses: Lumbar radiculopathy [M54.16]    Meds Administered:  Meds ordered this encounter  Medications   methylPREDNISolone acetate (DEPO-MEDROL) injection 80 mg    Laterality: Right  Location/Site: L4  Needle:5.0 in., 22 ga.  Short bevel or Quincke spinal needle  Needle Placement: Transforaminal  Findings:    -Comments: Excellent flow of contrast along the nerve, nerve root and into the epidural space.  Procedure Details: After squaring off the end-plates to get a true AP view, the C-arm was positioned so that an oblique view of the foramen as noted above was visualized. The target area is just inferior to the "nose of the scotty dog" or sub pedicular. The soft tissues overlying this structure were infiltrated with 2-3 ml. of 1% Lidocaine without Epinephrine.  The spinal needle was inserted toward the target using a "trajectory" view along the fluoroscope beam.  Under AP and lateral visualization, the needle was advanced so it did not puncture dura and was located close the 6 O'Clock position of the pedical in AP tracterory. Biplanar projections were used to confirm position. Aspiration was confirmed to be negative for CSF and/or blood. A 1-2 ml. volume of Isovue-250 was injected and flow of contrast was noted at each level. Radiographs were obtained for documentation purposes.   After attaining the  desired flow of contrast documented above, a 0.5 to 1.0 ml test dose of 0.25% Marcaine was injected into each respective transforaminal space.  The patient was observed for 90 seconds post injection.  After no sensory deficits were reported, and normal lower extremity motor function was noted,   the above injectate was administered so that equal amounts of the injectate were placed at each foramen (level) into the transforaminal epidural space.   Additional Comments:  The patient tolerated the procedure well Dressing: 2 x 2 sterile gauze and Band-Aid    Post-procedure details: Patient was observed during the procedure. Post-procedure instructions were reviewed.  Patient left the clinic in stable condition.

## 2022-09-01 NOTE — ED Triage Notes (Signed)
Patient arrives ambulatory by POV c/o central and left sided chest pain radiating into left jaw and back onset of about an hour ago. Reports yesterday having left calf pain and redness.

## 2022-09-01 NOTE — Progress Notes (Signed)
Kim Pham - 42 y.o. female MRN [id number]  Date of birth: Oct 20, 1980  Office Visit Note: Visit Date: 08/30/2022 PCP: Sandford Craze, NP Referred by: Sandford Craze, NP  Subjective: Chief Complaint  Patient presents with   Lower Back - Pain   HPI:  Kim Pham is a 42 y.o. female who comes in today at the request of Ellin Goodie, FNP and [place] Persons, PA-C for planned Right L4-5 Lumbar Transforaminal epidural steroid injection with fluoroscopic guidance.  The patient has failed conservative care including home exercise, medications, time and activity modification.  This injection will be diagnostic and hopefully therapeutic.  Please see requesting physician notes for further details and justification.   ROS Otherwise per HPI.  Assessment & Plan: Visit Diagnoses:    ICD-10-CM   1. Lumbar radiculopathy  M54.16 XR C-ARM NO REPORT    Epidural Steroid injection    methylPREDNISolone acetate (DEPO-MEDROL) injection 80 mg      Plan: No additional findings.   Meds & Orders:  Meds ordered this encounter  Medications   methylPREDNISolone acetate (DEPO-MEDROL) injection 80 mg    Orders Placed This Encounter  Procedures   XR C-ARM NO REPORT   Epidural Steroid injection    Follow-up: Return for visit to requesting provider as needed.   Procedures: No procedures performed  Lumbosacral Transforaminal Epidural Steroid Injection - Sub-Pedicular Approach with Fluoroscopic Guidance  Patient: Kim Pham      Date of Birth: Aug 17, 1980 MRN: [id number] PCP: Sandford Craze, NP      Visit Date: 08/30/2022   Universal Protocol:    Date/Time: 08/30/2022  Consent Given By: the patient  Position: PRONE  Additional Comments: Vital signs were monitored before and after the procedure. Patient was prepped and draped in the usual sterile fashion. The correct patient, procedure, and site was verified.   Injection Procedure Details:   Procedure  diagnoses: Lumbar radiculopathy [M54.16]    Meds Administered:  Meds ordered this encounter  Medications   methylPREDNISolone acetate (DEPO-MEDROL) injection 80 mg    Laterality: Right  Location/Site: L4  Needle:5.0 in., 22 ga.  Short bevel or Quincke spinal needle  Needle Placement: Transforaminal  Findings:    -Comments: Excellent flow of contrast along the nerve, nerve root and into the epidural space.  Procedure Details: After squaring off the end-plates to get a true AP view, the C-arm was positioned so that an oblique view of the foramen as noted above was visualized. The target area is just inferior to the "nose of the scotty dog" or sub pedicular. The soft tissues overlying this structure were infiltrated with 2-3 ml. of 1% Lidocaine without Epinephrine.  The spinal needle was inserted toward the target using a "trajectory" view along the fluoroscope beam.  Under AP and lateral visualization, the needle was advanced so it did not puncture dura and was located close the 6 O'Clock position of the pedical in AP tracterory. Biplanar projections were used to confirm position. Aspiration was confirmed to be negative for CSF and/or blood. A 1-2 ml. volume of Isovue-250 was injected and flow of contrast was noted at each level. Radiographs were obtained for documentation purposes.   After attaining the desired flow of contrast documented above, a 0.5 to 1.0 ml test dose of 0.25% Marcaine was injected into each respective transforaminal space.  The patient was observed for 90 seconds post injection.  After no sensory deficits were reported, and normal lower extremity motor function was noted,   the  above injectate was administered so that equal amounts of the injectate were placed at each foramen (level) into the transforaminal epidural space.   Additional Comments:  The patient tolerated the procedure well Dressing: 2 x 2 sterile gauze and Band-Aid    Post-procedure details: Patient  was observed during the procedure. Post-procedure instructions were reviewed.  Patient left the clinic in stable condition.    Clinical History: EXAM: MRI LUMBAR SPINE WITHOUT CONTRAST   TECHNIQUE: Multiplanar, multisequence MR imaging of the lumbar spine was performed. No intravenous contrast was administered.   COMPARISON:  Radiograph from 08/09/2022.   FINDINGS: Segmentation: Standard. Lowest well-formed disc space labeled the L5-S1 level.   Alignment: Trace levoscoliosis. Alignment otherwise normal with preservation of the normal lumbar lordosis. No significant listhesis.   Vertebrae: Vertebral body height maintained without acute or chronic fracture. Bone marrow signal intensity within normal limits. No discrete or worrisome osseous lesions. No abnormal marrow edema.   Conus medullaris and cauda equina: Conus extends to the L2-3 level. Conus and cauda equina appear normal.   Paraspinal and other soft tissues: Unremarkable.   Disc levels:   L1-2: Mild disc bulge. Mild bilateral facet hypertrophy. No spinal stenosis. Foramina remain patent.   L2-3: Mild annular disc bulge. Mild right greater than left facet hypertrophy. No spinal stenosis. Foramina remain patent.   L3-4: Mild diffuse disc bulge. Superimposed left foraminal to extraforaminal disc protrusion contacts the exiting left L3 nerve root. Mild facet and ligament flavum hypertrophy. Resultant mild spinal stenosis. Foramina remain adequately patent.   L4-5: Disc desiccation with mild disc bulge. Right foraminal to extraforaminal disc protrusion with annular fissure contacts the exiting right L4 nerve root (series 8, image 30). Mild to moderate bilateral facet hypertrophy. Resultant mild narrowing of the right lateral recess. Central canal remains patent. Mild bilateral L4 foraminal stenosis.   L5-S1: Disc desiccation with minimal annular disc bulge. No canal or lateral recess stenosis. Foramina remain  patent.   IMPRESSION: 1. Left foraminal to extraforaminal disc protrusion at L3-4, contacting the exiting left L3 nerve root. 2. Right foraminal to extraforaminal disc protrusion at L4-5, contacting the exiting right L4 nerve root. 3. Lower lumbar facet hypertrophy, most pronounced at L3-4 and L4-5. Finding could contribute to lower back pain.     Electronically Signed   By: Rise Mu M.D.   On: 08/10/2022 21:39     Objective:  VS:  HT:    WT:   BMI:     BP:101/70  HR:(!) 103bpm  TEMP: ( )  RESP:  Physical Exam Vitals and nursing note reviewed.  Constitutional:      General: She is not in acute distress.    Appearance: Normal appearance. She is not ill-appearing.  HENT:     Head: Normocephalic and atraumatic.     Right Ear: External ear normal.     Left Ear: External ear normal.  Eyes:     Extraocular Movements: Extraocular movements intact.  Cardiovascular:     Rate and Rhythm: Normal rate.     Pulses: Normal pulses.  Pulmonary:     Effort: Pulmonary effort is normal. No respiratory distress.  Abdominal:     General: There is no distension.     Palpations: Abdomen is soft.  Musculoskeletal:        General: Tenderness present.     Cervical back: Neck supple.     Right lower leg: No edema.     Left lower leg: No edema.  Comments: Patient has good distal strength with no pain over the greater trochanters.  No clonus or focal weakness.  Skin:    Findings: No erythema, lesion or rash.  Neurological:     General: No focal deficit present.     Mental Status: She is alert and oriented to person, place, and time.     Sensory: No sensory deficit.     Motor: No weakness or abnormal muscle tone.     Coordination: Coordination normal.  Psychiatric:        Mood and Affect: Mood normal.        Behavior: Behavior normal.      Imaging: No results found.

## 2022-09-01 NOTE — ED Provider Notes (Signed)
[place] [facility] Provider Note   CSN: [id number] Arrival date & time: 09/01/22  1058     History  Chief Complaint  Patient presents with   Chest Pain    Kim Pham is a 42 y.o. female.  Patient here with chest discomfort.  Started this morning.  Pressure-like.  Not sure if it is related to eating.  She is on Eliquis for history of PE.  She has not missed any doses.  She does not feel short of breath.  Denies any fever or chills.  No cough or sputum production.  History of lupus, anxiety, acid reflux.  No abdominal pain but has felt a little nauseous.  The history is provided by the patient.       Home Medications Prior to Admission medications   Medication Sig Start Date End Date Taking? Authorizing Provider  apixaban (ELIQUIS) 5 MG TABS tablet Take 1 tablet (5 mg total) by mouth 2 (two) times daily. 06/28/22  Yes Josph Macho, MD  atorvastatin (LIPITOR) 20 MG tablet Take 1 tablet (20 mg total) by mouth daily. 07/27/22  Yes Sandford Craze, NP  levothyroxine (SYNTHROID) 50 MCG tablet Take 1 tablet (50 mcg total) by mouth daily. 05/19/22  Yes Sandford Craze, NP  pantoprazole (PROTONIX) 40 MG tablet Take 40 mg by mouth daily as needed (acid reflux).   Yes [provider]  sertraline (ZOLOFT) 50 MG tablet TAKE 1 TABLET BY MOUTH DAILY 08/22/22  Yes Sandford Craze, NP  traZODone (DESYREL) 100 MG tablet TAKE TWO TABLETS BY MOUTH EVERY NIGHT AT BEDTIME 04/13/22  Yes Sandford Craze, NP  acetaminophen (TYLENOL) 500 MG tablet Take 1,000 mg by mouth every 6 (six) hours as needed for moderate pain.    [provider]  diazepam (VALIUM) 5 MG tablet Take one tablet by mouth with food one hour prior to procedure. May repeat 30 minutes prior if needed. 08/18/22   Juanda Chance, NP  dicyclomine (BENTYL) 20 MG tablet Take 1 tablet (20 mg total) by mouth 2 (two) times daily. Patient not taking: Reported on 08/18/2022  08/08/22   Mardene Sayer, MD  liothyronine (CYTOMEL) 5 MCG tablet Take 10 mcg by mouth in the morning.    [provider]  Melatonin 10 MG TABS Take 10 mg by mouth at bedtime as needed (sleep).    [provider]  methocarbamol (ROBAXIN) 500 MG tablet Take 1 tablet (500 mg total) by mouth every 6 (six) hours as needed for muscle spasms. 08/09/22   Persons, [place], PA  methylPREDNISolone (MEDROL DOSEPAK) 4 MG TBPK tablet Take per package directions Patient not taking: Reported on 08/18/2022 07/27/22   Sandford Craze, NP  Multiple Vitamin (MULTIVITAMIN WITH MINERALS) TABS tablet Take 1 tablet by mouth daily.    [provider]  nitrofurantoin, macrocrystal-monohydrate, (MACROBID) 100 MG capsule Take 1 capsule (100 mg total) by mouth 2 (two) times daily. Patient not taking: Reported on 08/18/2022 08/09/22   Sandford Craze, NP  ondansetron (ZOFRAN-ODT) 4 MG disintegrating tablet Take 1 tablet (4 mg total) by mouth every 8 (eight) hours as needed. 08/08/22   Mardene Sayer, MD  oxyCODONE-acetaminophen (PERCOCET/ROXICET) 5-325 MG tablet Take 1 tablet by mouth every 8 (eight) hours as needed for severe pain or moderate pain. 09/01/22   Juanda Chance, NP  promethazine (PHENERGAN) 25 MG tablet Take 1 tablet (25 mg total) by mouth every 8 (eight) hours as needed for nausea or vomiting.  05/18/22   Sandford Craze, NP  SUMAtriptan (IMITREX) 100 MG tablet Take 1 tablet (100 mg total) by mouth at onset of migraine. May repeat in 2 hours if headache persists or recurs. Do not take more than 2 doses in 24 hours. Patient taking differently: Take 100 mg by mouth as needed (at onset of migraine- may repeat once in 2 hours, if no relief- max of 2 doses/24 hours). 06/29/17   Anson Fret, MD      Allergies    Naltrexone, Rosuvastatin, Tape, Tramadol, Effexor [venlafaxine], Norco [hydrocodone-acetaminophen], and Voltaren [diclofenac]    Review of Systems   Review of  Systems  Physical Exam Updated Vital Signs BP 95/78   Pulse 82   Temp 97.6 F (36.4 C) (Oral)   Resp 16   Ht 5\' 9"  (1.753 m)   Wt 93.9 kg   SpO2 98%   BMI 30.57 kg/m  Physical Exam Vitals and nursing note reviewed.  Constitutional:      General: She is not in acute distress.    Appearance: She is well-developed.  HENT:     Head: Normocephalic and atraumatic.  Eyes:     Extraocular Movements: Extraocular movements intact.     Conjunctiva/sclera: Conjunctivae normal.     Pupils: Pupils are equal, round, and reactive to light.  Cardiovascular:     Rate and Rhythm: Normal rate and regular rhythm.     Pulses:          Radial pulses are 2+ on the right side and 2+ on the left side.     Heart sounds: Normal heart sounds. No murmur heard. Pulmonary:     Effort: Pulmonary effort is normal. No respiratory distress.     Breath sounds: Normal breath sounds. No decreased breath sounds or wheezing.  Abdominal:     Palpations: Abdomen is soft.     Tenderness: There is no abdominal tenderness.  Musculoskeletal:        General: No swelling. Normal range of motion.     Cervical back: Normal range of motion and neck supple.     Right lower leg: No edema.     Left lower leg: No edema.  Skin:    General: Skin is warm and dry.     Capillary Refill: Capillary refill takes less than 2 seconds.  Neurological:     General: No focal deficit present.     Mental Status: She is alert.  Psychiatric:        Mood and Affect: Mood normal.     ED Results / Procedures / Treatments   Labs (all labs ordered are listed, but only abnormal results are displayed) Labs Reviewed  COMPREHENSIVE METABOLIC PANEL - Abnormal; Notable for the following components:      Result Value   Potassium 3.4 (*)    Glucose, Bld 101 (*)    All other components within normal limits  LIPASE, BLOOD - Abnormal; Notable for the following components:   Lipase 66 (*)    All other components within normal limits  CBC WITH  DIFFERENTIAL/PLATELET  D-DIMER, QUANTITATIVE  TROPONIN I (HIGH SENSITIVITY)    EKG EKG Interpretation  Date/Time:  Wednesday Sep 01 2022 11:06:58 EDT Ventricular Rate:  92 PR Interval:  125 QRS Duration: 86 QT Interval:  462 QTC Calculation: 572 R Axis:   13 Text Interpretation: Sinus rhythm Low voltage, precordial leads Confirmed by Virgina Norfolk ([id number]) on 09/01/2022 11:11:14 AM  Radiology DG Chest Portable 1 View  Result Date:  09/01/2022 CLINICAL DATA:  Chest pain EXAM: PORTABLE CHEST 1 VIEW COMPARISON:  None Available. FINDINGS: No pleural effusion. No pneumothorax. No focal airspace opacity. No radiographically apparent displaced rib fractures. Visualized upper abdomen is unremarkable. Normal cardiac and mediastinal contours IMPRESSION: No focal airspace opacity Electronically Signed   By: Lorenza Cambridge M.D.   On: 09/01/2022 11:44   XR C-ARM NO REPORT  Result Date: 08/30/2022 Please see Notes tab for imaging impression.  Epidural Steroid injection  Result Date: 08/30/2022 Tyrell Antonio, MD     09/01/2022 10:51 AM Lumbosacral Transforaminal Epidural Steroid Injection - Sub-Pedicular Approach with Fluoroscopic Guidance Patient: IVORY MAHOWALD     Date of Birth: [birth year] MRN: [id number] PCP: Sandford Craze, NP     Visit Date: 08/30/2022  Universal Protocol:   Date/Time: 08/30/2022 Consent Given By: the patient Position: PRONE Additional Comments: Vital signs were monitored before and after the procedure. Patient was prepped and draped in the usual sterile fashion. The correct patient, procedure, and site was verified. Injection Procedure Details: Procedure diagnoses: Lumbar radiculopathy [M54.16]  Meds Administered: Meds ordered this encounter Medications  methylPREDNISolone acetate (DEPO-MEDROL) injection 80 mg Laterality: Right Location/Site: L4 Needle:5.0 in., 22 ga.  Short bevel or Quincke spinal needle Needle Placement: Transforaminal Findings:   -Comments: Excellent flow  of contrast along the nerve, nerve root and into the epidural space. Procedure Details: After squaring off the end-plates to get a true AP view, the C-arm was positioned so that an oblique view of the foramen as noted above was visualized. The target area is just inferior to the "nose of the scotty dog" or sub pedicular. The soft tissues overlying this structure were infiltrated with 2-3 ml. of 1% Lidocaine without Epinephrine. The spinal needle was inserted toward the target using a "trajectory" view along the fluoroscope beam.  Under AP and lateral visualization, the needle was advanced so it did not puncture dura and was located close the 6 O'Clock position of the pedical in AP tracterory. Biplanar projections were used to confirm position. Aspiration was confirmed to be negative for CSF and/or blood. A 1-2 ml. volume of Isovue-250 was injected and flow of contrast was noted at each level. Radiographs were obtained for documentation purposes. After attaining the desired flow of contrast documented above, a 0.5 to 1.0 ml test dose of 0.25% Marcaine was injected into each respective transforaminal space.  The patient was observed for 90 seconds post injection.  After no sensory deficits were reported, and normal lower extremity motor function was noted,   the above injectate was administered so that equal amounts of the injectate were placed at each foramen (level) into the transforaminal epidural space. Additional Comments: The patient tolerated the procedure well Dressing: 2 x 2 sterile gauze and Band-Aid  Post-procedure details: Patient was observed during the procedure. Post-procedure instructions were reviewed. Patient left the clinic in stable condition.    Procedures Procedures    Medications Ordered in ED Medications  alum & mag hydroxide-simeth (MAALOX/MYLANTA) 200-200-20 MG/5ML suspension 30 mL (30 mLs Oral Given 09/01/22 1128)  fentaNYL (SUBLIMAZE) injection 50 mcg (50 mcg Intravenous Given  09/01/22 1150)    ED Course/ Medical Decision Making/ A&P                             Medical Decision Making Amount and/or Complexity of Data Reviewed Labs: ordered. Radiology: ordered.  Risk OTC drugs. Prescription drug management.   Hayvin Petrey Hage is  here with chest pain.  History of PE on Eliquis has been compliant, history of high cholesterol and lupus.  Chest pain started prior to arrival.  Differential diagnosis acid reflux/less likely ACS or PE or infectious process.  She is well-appearing.  EKG shows sinus rhythm.  No ischemic changes.  Will check CBC, CMP, lipase, troponin and D-dimer and chest x-ray.  Will give GI cocktail and reevaluate.  Per my review and interpretation of labs troponin is normal.  D-dimer is normal.  Chest x-ray per my review interpretation shows no evidence of pneumonia or pneumothorax.  No significant anemia, electrolyte abnormality, kidney injury or leukocytosis otherwise.  Overall do suspect that this might be musculoskeletal or GI related.  Will have her follow-up with her primary care doctor.  Discharged in good condition.  This chart was dictated using voice recognition software.  Despite best efforts to proofread,  errors can occur which can change the documentation meaning.         Final Clinical Impression(s) / ED Diagnoses Final diagnoses:  Nonspecific chest pain    Rx / DC Orders ED Discharge Orders     None         Virgina Norfolk, DO 09/01/22 1218

## 2022-09-02 ENCOUNTER — Encounter: Payer: Self-pay | Admitting: Family

## 2022-09-03 ENCOUNTER — Encounter: Payer: Self-pay | Admitting: Physical Therapy

## 2022-09-03 ENCOUNTER — Ambulatory Visit: Payer: BC Managed Care – PPO | Admitting: Physical Therapy

## 2022-09-03 DIAGNOSIS — M5440 Lumbago with sciatica, unspecified side: Secondary | ICD-10-CM | POA: Diagnosis not present

## 2022-09-03 DIAGNOSIS — M5416 Radiculopathy, lumbar region: Secondary | ICD-10-CM | POA: Diagnosis not present

## 2022-09-03 DIAGNOSIS — R252 Cramp and spasm: Secondary | ICD-10-CM

## 2022-09-03 NOTE — Therapy (Signed)
OUTPATIENT PHYSICAL THERAPY TREATMENT   Patient Name: Kim Pham MRN: [id number] DOB:December 05, 1980, 42 y.o., female Today's Date: 09/03/2022  END OF SESSION:  PT End of Session - 09/03/22 0806     Visit Number 4    Number of Visits 12    Date for PT Re-Evaluation 09/29/22    Authorization Type BC[employer]    PT Start Time 0806    PT Stop Time 0845    PT Time Calculation (min) 39 min    Activity Tolerance Patient tolerated treatment well    Behavior During Therapy [facility] for tasks assessed/performed              Past Medical History:  Diagnosis Date   Acute pulmonary embolism without acute cor pulmonale (HCC) 05/17/2022   Adnexal mass 02/24/2021   in epic seen on [place]   Amenorrhea 08/14/2021   Anxiety 08/14/2021   Arthralgia 08/18/2016   Back pain 09/04/2021   Blood in stool 09/08/2021   Cardiac murmur 12/16/2021   Chest discomfort 04/20/2022   Cholecystitis 05/26/2022   Chondromalacia, patella 06/30/2021   Chronic calculous cholecystitis 06/08/2022   Chronic constipation 08/14/2021   Chronic headaches HISTORY OF MIGRAINES 08/14/2021   Chronic migraine without aura, with intractable migraine, so stated, with status migrainosus 07/27/2017   Complication of anesthesia 08/14/2021   PONV   Constipation 06/11/2021   Coronary artery calcification seen on CT scan 12/16/2021   Cyst of left ovary 06/17/2021   Degenerative disc disease, lumbar 09/04/2019   Depression with anxiety 03/26/2016   Diarrhea 07/08/2017   DOE (dyspnea on exertion) 04/20/2022   Effusion, left knee 12/16/2021   Endometriosis    Family history of BRCA gene mutation 06/17/2021   [facility] genetic testing done 06/2021 - negative for breast cancer genes.   Family history of breast cancer 09/09/2015   Formatting of this note might be different from the original.  Mom dx'd age 75  Mat aunt dx'd age 85  Neither relative tested for BRCA  Formatting of this note might be different from the original.  Mom dx'd age  65  Mat aunt dx'd age 73  Neither relative tested for BRCA   Fatigue 03/26/2016   Former smoker 04/20/2022   Gastroesophageal reflux disease 08/19/2020   Hair loss 11/22/2016   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 (HCC)    High risk medication use 11/22/2016   history of Chiari malformation    History of Chiari malformation 11/22/2016   history of COVID 02/2021   LOW GRADE FEVER CONGESTION AND HEADACHE ALL SYMPTOMS RESOLVED   Hot flashes 06/11/2021   Hyperlipidemia    Insomnia 08/19/2020   Low blood pressure    pt states history of low blood pressure-fainted 12/2010-instructed to move slowly   Nipple discharge 05/19/2022   Nonepileptic episode (HCC) 12/22/2016   Numbness and tingling 02/25/2016   Obesity (BMI 30.0-34.9) 12/16/2021   On continuous oral anticoagulation 05/17/2022   OSTEOARTHRITIS 08/14/2021   Other epilepsy, intractable, without status epilepticus (HCC) 11/01/2016   Pain in right knee 11/13/2020   Pain in unspecified joint 08/18/2016   Palpitations 12/15/2021   Pelvic pain in female 08/18/2021   Photosensitivity 11/22/2016   PONV (postoperative nausea and vomiting)    Preventative health care 09/08/2021   Psychogenic nonepileptic seizure 07/27/2017   Pulmonary embolism (HCC)    Raynaud's disease without gangrene 11/22/2016   Rectal bleeding 06/11/2021   RUQ pain 05/07/2022   Sacroiliac joint dysfunction of right side 12/19/2019   Sicca syndrome (  HCC) 11/22/2016   SLE (systemic lupus erythematosus) (HCC) 03/26/2016   Status post laparoscopic cholecystectomy 06/08/2022   Subclinical hypothyroidism    Thyroid nodule    Transient alteration of awareness 01/01/2016   Vasomotor symptoms due to menopause 06/17/2021   Voice hoarseness 05/07/2022   Wears glasses for reading 08/14/2021   Past Surgical History:  Procedure Laterality Date   ABDOMINAL HYSTERECTOMY  2007   CHOLECYSTECTOMY N/A 06/08/2022   Procedure: LAPAROSCOPIC CHOLECYSTECTOMY WITH ICG;   Surgeon: Almond Lint, MD;  Location: [facility];  Service: General;  Laterality: N/A;   CYSTOSCOPY  2007   WITH HYSTERECTOMY   DIAGNOSTIC LAPAROSCOPY  2005   OOPHORECTOMY Left 08/18/2021   ROTATOR CUFF REPAIR Right 2010   SALPINGOOPHORECTOMY  04/16/2011   Procedure: SALPINGO OOPHERECTOMY;  Surgeon: Lenoard Aden, MD;  Location: [facility];  Service: Gynecology;  Laterality: Right;   skull decompression surgery 2005     @ [facility]   Patient Active Problem List   Diagnosis Date Noted   Migraines 07/27/2022   Alcohol abuse 06/25/2022   Status post laparoscopic cholecystectomy 06/08/2022   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 (HCC) 05/27/2022   On continuous oral anticoagulation 05/17/2022   Pulmonary embolism (HCC) 05/11/2022   RUQ pain 05/07/2022   Voice hoarseness 05/07/2022   Thyroid nodule 05/04/2022   Former smoker 04/20/2022   Subclinical hypothyroidism 12/18/2021   Effusion, left knee 12/16/2021   Endometriosis 12/16/2021   Cardiac murmur 12/16/2021   Obesity (BMI 30.0-34.9) 12/16/2021   Coronary artery calcification seen on CT scan 12/16/2021   Preventative health care 09/08/2021   Hyperlipidemia 09/08/2021   Amenorrhea 08/14/2021   Anxiety 08/14/2021   Chronic constipation 08/14/2021   Chronic headaches HISTORY OF MIGRAINES 08/14/2021   Complication of anesthesia 08/14/2021   HISTORY OF MIGRAINES 08/14/2021   OSTEOARTHRITIS 08/14/2021   Wears glasses for reading 08/14/2021   Chondromalacia, patella 06/30/2021   Family history of BRCA gene mutation 06/17/2021   Cyst of left ovary 06/17/2021   Vasomotor symptoms due to menopause 06/17/2021   Rectal bleeding 06/11/2021   Hot flashes 06/11/2021   Insomnia 08/19/2020   Gastroesophageal reflux disease 08/19/2020   Sacroiliac joint dysfunction of right side 12/19/2019   Degenerative disc disease, lumbar 09/04/2019   Psychogenic nonepileptic seizure 12/22/2016   Sicca syndrome (HCC) 11/22/2016   Raynaud's  disease without gangrene 11/22/2016   History of Chiari malformation 11/22/2016   High risk medication use 11/22/2016   Other epilepsy, intractable, without status epilepticus (HCC) 11/01/2016   Arthralgia 08/18/2016   SLE (systemic lupus erythematosus) (HCC) 03/26/2016   Depression with anxiety 03/26/2016   Transient alteration of awareness 01/01/2016   Chiari malformation 12/19/2015   Family history of breast cancer 09/09/2015    PCP: Sandford Craze, NP  REFERRING PROVIDER: Persons, [place], [place]   REFERRING DIAG:  M54.16 (ICD-10-CM) - Lumbar radiculopathy  M51.16 (ICD-10-CM) - Intervertebral disc disorders with radiculopathy, lumbar region  M47.816 (ICD-10-CM) - Facet arthropathy, lumbar    Rationale for Evaluation and Treatment: Rehabilitation  THERAPY DIAG:  Radiculopathy, lumbar region  Cramp and spasm  ONSET DATE: 4 weeks  prior to eval  SUBJECTIVE:  SUBJECTIVE STATEMENT: Got steriod injection on Monday, not really having pain going down her leg now, just numbness, having more pain in piriformis muscles.    PERTINENT HISTORY:  PE, Raynaud's, migraines, lupus, former smoker  PAIN:  Are you having pain? Yes: NPRS scale: 5/10 Pain location: right side back radiating down R leg to foot, numbness R thigh, R heel  Pain description: deep ache in back, throbbing pain in leg Aggravating factors: standing >10 min,  sitting > 1 hour if can reposition in comfortable chair, driving too long, bending over and picking up anything heavy, sleeping on right side.  Relieving factors: heat pack, repositioning, percocet (takes if level 10)  PRECAUTIONS: None  WEIGHT BEARING RESTRICTIONS: No  FALLS:  Has patient fallen in last 6 months? Yes. Number of falls 1- dog tripped her.  Not normally  off balance.   LIVING ENVIRONMENT: Lives with: lives with their spouse Lives in: House/apartment Stairs: Yes: Internal: 15 steps; on right going up to basement, difficulty going up Has following equipment at home: None  OCCUPATION: hospice nurse   PLOF: Independent and Leisure: walk 1-2 miles/day  PATIENT GOALS: avoid surgery, decrease pain  NEXT MD VISIT: not scheduled at this time, should be in couple of weeks.   OBJECTIVE:   DIAGNOSTIC FINDINGS:  MRI  lumbar spine 08/10/22 Narrative & Impression  FINDINGS: Segmentation: Standard. Lowest well-formed disc space labeled the L5-S1 level.   Alignment: Trace levoscoliosis. Alignment otherwise normal with preservation of the normal lumbar lordosis. No significant listhesis.   Vertebrae: Vertebral body height maintained without acute or chronic fracture. Bone marrow signal intensity within normal limits. No discrete or worrisome osseous lesions. No abnormal marrow edema.   Conus medullaris and cauda equina: Conus extends to the L2-3 level. Conus and cauda equina appear normal.   Paraspinal and other soft tissues: Unremarkable.   Disc levels:   L1-2: Mild disc bulge. Mild bilateral facet hypertrophy. No spinal stenosis. Foramina remain patent.   L2-3: Mild annular disc bulge. Mild right greater than left facet hypertrophy. No spinal stenosis. Foramina remain patent.   L3-4: Mild diffuse disc bulge. Superimposed left foraminal to extraforaminal disc protrusion contacts the exiting left L3 nerve root. Mild facet and ligament flavum hypertrophy. Resultant mild spinal stenosis. Foramina remain adequately patent.   L4-5: Disc desiccation with mild disc bulge. Right foraminal to extraforaminal disc protrusion with annular fissure contacts the exiting right L4 nerve root (series 8, image 30). Mild to moderate bilateral facet hypertrophy. Resultant mild narrowing of the right lateral recess. Central canal remains patent. Mild  bilateral L4 foraminal stenosis.   L5-S1: Disc desiccation with minimal annular disc bulge. No canal or lateral recess stenosis. Foramina remain patent.   IMPRESSION: 1. Left foraminal to extraforaminal disc protrusion at L3-4, contacting the exiting left L3 nerve root. 2. Right foraminal to extraforaminal disc protrusion at L4-5, contacting the exiting right L4 nerve root. 3. Lower lumbar facet hypertrophy, most pronounced at L3-4 and L4-5. Finding could contribute to lower back pain.    PATIENT SURVEYS:  Modified Oswestry 24/50 = 48%   SCREENING FOR RED FLAGS: Bowel or bladder incontinence: No Spinal tumors: No Cauda equina syndrome: No Compression fracture: No Abdominal aneurysm: No  COGNITION: Overall cognitive status: Within functional limits for tasks assessed     SENSATION: Light touch: Impaired  25% diminished over R L3/4 dermatomes  MUSCLE LENGTH: Hamstrings: moderate tightness bilaterally  POSTURE: No Significant postural limitations  PALPATION: Tenderness with PA mobs L2-4, sacrum, R  lumbar paraspinals, R glutes, R piriformis  LUMBAR ROM:   AROM eval  Flexion To mid shin limited by pain  Extension Limited 75%, p!  Right lateral flexion Past knee  Left lateral flexion To knee, p!  Right rotation Limited 25%, p!  Left rotation No limitation   (Blank rows = not tested)  LOWER EXTREMITY ROM:   WNL, symmetric bilaterally   LOWER EXTREMITY MMT:    MMT Right eval Left eval  Hip flexion 5 5  Hip extension 4 (limited by pain) 5  Hip abduction 5 5  Hip adduction 5 5  Knee flexion 5 5  Knee extension 5 5  Ankle dorsiflexion 5 5  Ankle plantarflexion 5 5   (Blank rows = not tested)  LUMBAR SPECIAL TESTS:  Straight leg raise test: Positive  FUNCTIONAL TESTS:   GAIT: Distance walked: 87' Assistive device utilized: None Level of assistance: Complete Independence Comments: WFL  TODAY'S TREATMENT:                                                                                                                               DATE:  09/03/2022 Therapeutic Exercise: to improve strength and mobility.  Demo, verbal and tactile cues throughout for technique. Bike L2 x 5 min  Mckenzie side glides - preference for L side to wall, followed by extension at wall x 10  Prone knee bends x 10 Prone leg extension x 10 bil - uncomfortable.  After Manual therapy - seated piriformis stretches.  Manual Therapy: to decrease muscle spasm and pain and improve mobility STM/TPR to R glutes/piriformis, lumbar paraspinals, R UPA mobs grade 2-3 to lumbar spine, skilled palpation and monitoring during dry needling.  IASTM with foam roller to R glute post TrDN.  Trigger Point Dry-Needling  Treatment instructions: Expect mild to moderate muscle soreness. S/S of pneumothorax if dry needled over a lung field, and to seek immediate medical attention should they occur. Patient verbalized understanding of these instructions and education. Patient Consent Given: Yes Education handout provided: Yes Muscles treated: R glut med, R piriformis Electrical stimulation performed: No Parameters: N/A Treatment response/outcome: Twitch Response Elicited and Palpable Increase in Muscle Length   08/25/22 Bike L3 x 5 min Prone lying centralizes pain almost immediately pain 4-5/10 Prone press ups x 10 no change, x 10 with exhale on updog - increased R leg pain to shin 5/10 Prone over one pillow x 1 min centralizes pain again Prone knee flexion with pelvic press x 10 ea  Manual: gentle PA mobs lumbar spine gd II; TPR in L S/L to R QL, STM to R lumbar Self MFR with ball to lumbar gluteals Manual traction in hooklying with belt x 20 sec tried to assess tolerance to mechanical tx.  Modalities: supine Mechanical lumbar traction Intermittent 60/20 75#/50# x 15 min (wt 205#)   08/23/22 Therapeutic Exercise: to improve strength and mobility.   UBE L1.0 3 min fwd/ 3 min back  Prone knee  flexion x 10 B Prone on elbows x 1 min hold  Prone press up 5x5" Prone hip extension x 10 B Supine bridge green TB 2 x 10  Supine clams green TB 10x5" Seated piriformis and glute med stretch x 30 sec  each B Standing lumbar extension 10x3"  Manual Therapy: to decrease muscle spasm, pain and improve mobility IASTM with foam roll to lumbar paraspinals   08/18/22 Self Care: Education on exercises, goal to centralize symptoms, stop if exercises cause symptoms to peripheralize and inform PT, if symptoms centralize and pain slightly increases in low back this is ok.  Starting with prone lying x 3 min, then bending R leg x 10 to point of tightness not pain to floss nerve.     PATIENT EDUCATION:  Education details: HEP update, TrDN education Person educated: Patient Education method: Explanation, Demonstration, Verbal cues, and Handouts Education comprehension: verbalized understanding and returned demonstration  HOME EXERCISE PROGRAM: Access Code: [email or web address] URL: [email or web address] Date: 09/03/2022 Prepared by: Harrie Foreman  Exercises - Prone Knee Flexion  - 3 x daily - 7 x weekly - 1 sets - 10 reps - Prone Press Up On Elbows  - 1 x daily - 7 x weekly - 3 sets - 3 reps - 1  min hold - Prone Press Up  - 1 x daily - 7 x weekly - 3 sets - 5 reps - 5- 10 second hold - Supine Bridge with Resistance Band  - 1 x daily - 7 x weekly - 3 sets - 10 reps - Seated Piriformis Stretch with Trunk Bend  - 1 x daily - 7 x weekly - 3 reps - 30 second hold - Standing Quadratus Lumborum Mobilization with Small Ball on Wall  - 1 x daily - 7 x weekly - 3 sets - 10 reps - Left Standing Lateral Shift Correction at Wall - Repetitions  - 5 x daily - 7 x weekly - 1 sets - 10 reps - Standing Lumbar Extension with Counter  - 5 x daily - 7 x weekly - 1 sets - 10 reps - Standing Lumbar Extension at Wall - Forearms  - 5 x daily - 7 x weekly - 1 sets - 10 reps  Patient Education - Posture and Body  Mechanics  ASSESSMENT:  CLINICAL IMPRESSION: Kim Pham reports improving pain and radicular symptoms following epidural injection in her back on Monday.  She is compliant with HEP meeting STG #1.  She still reports intermittent numbness in RLE but today reported more tightness in R glutes. Continued to progress exercises adding standing McKenzie based exercises today, then manual therapy to help centralized symptoms and TrDN to R glutes/piriformis with good twitch response, reported decreased tightness following.  Kim Pham continues to demonstrate potential for improvement and would benefit from continued skilled therapy to address impairments.       OBJECTIVE IMPAIRMENTS: decreased activity tolerance, decreased mobility, decreased ROM, increased fascial restrictions, increased muscle spasms, impaired flexibility, impaired sensation, and pain.   ACTIVITY LIMITATIONS: carrying, lifting, bending, sitting, standing, sleeping, stairs, transfers, bed mobility, locomotion level, and caring for others  PARTICIPATION LIMITATIONS: meal prep, cleaning, laundry, driving, shopping, community activity, and occupation  PERSONAL FACTORS: Profession and 3+ comorbidities: PE, Raynaud's, migraines, lupus, former smoker  are also affecting patient's functional outcome.   REHAB POTENTIAL: Good  CLINICAL DECISION MAKING: Evolving/moderate complexity  EVALUATION COMPLEXITY: Moderate   GOALS: Goals reviewed with patient? Yes  SHORT TERM GOALS: Target date:  09/01/2022  Patient will be independent with initial HEP.  Baseline: Goal status: MET 09/03/22  2.  Patient will report centralization of radicular symptoms.  Baseline: radiating down RLE Goal status: IN PROGRESS 09/03/22- no pain but intermittent numbness down RLE   LONG TERM GOALS: Target date: 09/29/2022    Patient will be independent with advanced/ongoing HEP to improve outcomes and carryover.  Baseline:  Goal status: IN  PROGRESS  2.  Patient will report 75% improvement in low back pain to improve QOL.  Baseline:  Goal status: IN PROGRESS  3.  Patient will demonstrate full pain free lumbar ROM to perform ADLs.   Baseline: see objective Goal status: IN PROGRESS  4.  Patient will report at least 6 points improvement on modified Oswestry to demonstrate improved functional ability.  Baseline: 24/50 Goal status: IN PROGRESS   5.  Patient will tolerate 60 min of walking to exercise. Baseline: used to walk 1-2 miles/day Goal status: IN PROGRESS  6.  Patient will be able to perform all job duties without limitation from low back pain. 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  PLAN FOR NEXT SESSION: progress exercises focusing on mckenzie extension and neutral spine, manual therapy, modalities PRN.  Also standing core stabilization exercises.   Jena Gauss, PT 09/03/2022, 8:53 AM

## 2022-09-03 NOTE — Patient Instructions (Signed)

## 2022-09-06 ENCOUNTER — Ambulatory Visit: Payer: BC Managed Care – PPO | Admitting: Physician Assistant

## 2022-09-06 NOTE — Progress Notes (Signed)
Subjective:   By signing my name below, I, Dashell Ryon, attest that this documentation has been prepared under the direction and in the presence of Lemont Fillers, NP 09/09/22   Patient ID: Kim Pham, female    DOB: 1980/12/10, 42 y.o.   MRN: [id number]  Chief Complaint  Patient presents with   Abdominal Pain    Complains of "some abdominal pain and nausea"    HPI Patient is in today for an office visit.   Abdominal pain:  She complains of constant nausea and upper abdominal pain. She states her abdominal pain at times radiates to the right and also endorses occasional lower abdominal pain. She takes 4 mg Zofran with severe episode of nausea, which has helped. She reports low appetite and has been only eating about 25% of her meals. She has been having regular bowel movements every other day. Denies any constipation. Her upper endoscopy from 11/2021 was normal. She states in January she learned she had diverticulosis.   She reports chest pain/tightness last week, which she currently attributes to a panic attack.   Past Medical History:  Diagnosis Date   Acute pulmonary embolism without acute cor pulmonale (HCC) 05/17/2022   Adnexal mass 02/24/2021   in epic seen on [place]   Amenorrhea 08/14/2021   Anxiety 08/14/2021   Arthralgia 08/18/2016   Back pain 09/04/2021   Blood in stool 09/08/2021   Cardiac murmur 12/16/2021   Chest discomfort 04/20/2022   Cholecystitis 05/26/2022   Chondromalacia, patella 06/30/2021   Chronic calculous cholecystitis 06/08/2022   Chronic constipation 08/14/2021   Chronic headaches HISTORY OF MIGRAINES 08/14/2021   Chronic migraine without aura, with intractable migraine, so stated, with status migrainosus 07/27/2017   Complication of anesthesia 08/14/2021   PONV   Constipation 06/11/2021   Coronary artery calcification seen on CT scan 12/16/2021   Cyst of left ovary 06/17/2021   Degenerative disc disease, lumbar 09/04/2019    Depression with anxiety 03/26/2016   Diarrhea 07/08/2017   DOE (dyspnea on exertion) 04/20/2022   Effusion, left knee 12/16/2021   Endometriosis    Family history of BRCA gene mutation 06/17/2021   [facility] genetic testing done 06/2021 - negative for breast cancer genes.   Family history of breast cancer 09/09/2015   Formatting of this note might be different from the original.  Mom dx'd age 9  Mat aunt dx'd age 61  Neither relative tested for BRCA  Formatting of this note might be different from the original.  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 (HCC)    High risk medication use 11/22/2016   history of Chiari malformation    History of Chiari malformation 11/22/2016   history of COVID 02/2021   LOW GRADE FEVER CONGESTION AND HEADACHE ALL SYMPTOMS RESOLVED   Hot flashes 06/11/2021   Hyperlipidemia    Insomnia 08/19/2020   Low blood pressure    pt states history of low blood pressure-fainted 12/2010-instructed to move slowly   Nipple discharge 05/19/2022   Nonepileptic episode (HCC) 12/22/2016   Numbness and tingling 02/25/2016   Obesity (BMI 30.0-34.9) 12/16/2021   On continuous oral anticoagulation 05/17/2022   OSTEOARTHRITIS 08/14/2021   Other epilepsy, intractable, without status epilepticus (HCC) 11/01/2016   Pain in right knee 11/13/2020   Pain  in unspecified joint 08/18/2016   Palpitations 12/15/2021   Pelvic pain in female 08/18/2021   Photosensitivity 11/22/2016   PONV (postoperative nausea and vomiting)    Preventative health care 09/08/2021   Psychogenic nonepileptic seizure 07/27/2017   Pulmonary embolism (HCC)    Raynaud's disease without gangrene 11/22/2016   Rectal bleeding 06/11/2021   RUQ pain 05/07/2022   Sacroiliac joint dysfunction of right side 12/19/2019    Sicca syndrome (HCC) 11/22/2016   SLE (systemic lupus erythematosus) (HCC) 03/26/2016   Status post laparoscopic cholecystectomy 06/08/2022   Subclinical hypothyroidism    Thyroid nodule    Transient alteration of awareness 01/01/2016   Vasomotor symptoms due to menopause 06/17/2021   Voice hoarseness 05/07/2022   Wears glasses for reading 08/14/2021    Past Surgical History:  Procedure Laterality Date   ABDOMINAL HYSTERECTOMY  2007   CHOLECYSTECTOMY N/A 06/08/2022   Procedure: LAPAROSCOPIC CHOLECYSTECTOMY WITH ICG;  Surgeon: Almond Lint, MD;  Location: [facility];  Service: General;  Laterality: N/A;   CYSTOSCOPY  2007   WITH HYSTERECTOMY   DIAGNOSTIC LAPAROSCOPY  2005   OOPHORECTOMY Left 08/18/2021   ROTATOR CUFF REPAIR Right 2010   SALPINGOOPHORECTOMY  04/16/2011   Procedure: SALPINGO OOPHERECTOMY;  Surgeon: Lenoard Aden, MD;  Location: [facility];  Service: Gynecology;  Laterality: Right;   skull decompression surgery 2005     @ [facility]    Family History  Problem Relation Age of Onset   Hypertension Mother    CAD Mother    Breast cancer Mother    Migraines Mother    Gout Mother    Heart disease Mother    Crohn's disease Mother    Hypertension Father    CAD Father    CVA Father    Heart disease Father    Colon polyps Father    Liver disease Father    Sjogren's syndrome Sister    Celiac disease Sister    Rheum arthritis Maternal Grandmother    Dementia Maternal Grandmother    Pancreatic cancer Maternal Grandfather    CAD Paternal Grandmother    Macular degeneration Paternal Grandmother    CVA Paternal Grandfather    Autism Son    ADD / ADHD Son    Breast cancer Maternal Aunt    Esophageal cancer Neg Hx    Colon cancer Neg Hx    Stomach cancer Neg Hx     Social History   Socioeconomic History   Marital status: Married    Spouse name: Not on file   Number of children: 1   Years of education: Assoc   Highest education level: Not on file  Occupational  History   Occupation: nurse  Tobacco Use   Smoking status: Former    Packs/day: 1.00    Years: 10.00    Additional pack years: 0.00    Total pack years: 10.00    Types: Cigarettes    Quit date: 04/04/2004    Years since quitting: 18.4    Passive exposure: Past   Smokeless tobacco: Never  Vaping Use   Vaping Use: Never used  Substance and Sexual Activity   Alcohol use: Not Currently   Drug use: Never   Sexual activity: Yes    Birth control/protection: Surgical  Other Topics Concern   Not on file  Social History Narrative   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   Married   1 son- born 2004 has autism   Enjoys exercise, special olympics with son  Drinks 4-5 cups of coffee a day       Right handed   Social Determinants of Health   Financial Resource Strain: Not on file  Food Insecurity: No Food Insecurity (06/08/2022)   Hunger Vital Sign    Worried About Running Out of Food in the Last Year: Never true    Ran Out of Food in the Last Year: Never true  Transportation Needs: No Transportation Needs (06/08/2022)   PRAPARE - Administrator, Civil Service (Medical): No    Lack of Transportation (Non-Medical): No  Physical Activity: Not on file  Stress: Not on file  Social Connections: Not on file  Intimate Partner Violence: Not At Risk (06/08/2022)   Humiliation, Afraid, Rape, and Kick questionnaire    Fear of Current or Ex-Partner: No    Emotionally Abused: No    Physically Abused: No    Sexually Abused: No    Outpatient Medications Prior to Visit  Medication Sig Dispense Refill   acetaminophen (TYLENOL) 500 MG tablet Take 1,000 mg by mouth every 6 (six) hours as needed for moderate pain.     apixaban (ELIQUIS) 5 MG TABS tablet Take 1 tablet (5 mg total) by mouth 2 (two) times daily. 60 tablet 2   atorvastatin (LIPITOR) 20 MG tablet Take 1 tablet (20 mg total) by mouth daily. 90 tablet 1   levothyroxine (SYNTHROID) 50 MCG tablet Take 1 tablet (50 mcg total) by mouth  daily. 90 tablet 1   liothyronine (CYTOMEL) 5 MCG tablet Take 10 mcg by mouth in the morning.     Melatonin 10 MG TABS Take 10 mg by mouth at bedtime as needed (sleep).     methocarbamol (ROBAXIN) 500 MG tablet Take 1 tablet (500 mg total) by mouth every 6 (six) hours as needed for muscle spasms. 30 tablet 0   Multiple Vitamin (MULTIVITAMIN WITH MINERALS) TABS tablet Take 1 tablet by mouth daily.     ondansetron (ZOFRAN-ODT) 4 MG disintegrating tablet Take 1 tablet (4 mg total) by mouth every 8 (eight) hours as needed. 20 tablet 0   oxyCODONE-acetaminophen (PERCOCET/ROXICET) 5-325 MG tablet Take 1 tablet by mouth every 8 (eight) hours as needed for severe pain or moderate pain. 15 tablet 0   pantoprazole (PROTONIX) 40 MG tablet Take 40 mg by mouth daily as needed (acid reflux).     promethazine (PHENERGAN) 25 MG tablet Take 1 tablet (25 mg total) by mouth every 8 (eight) hours as needed for nausea or vomiting. 30 tablet 1   sertraline (ZOLOFT) 50 MG tablet TAKE 1 TABLET BY MOUTH DAILY 90 tablet 1   SUMAtriptan (IMITREX) 100 MG tablet Take 1 tablet (100 mg total) by mouth at onset of migraine. May repeat in 2 hours if headache persists or recurs. Do not take more than 2 doses in 24 hours. (Patient taking differently: Take 100 mg by mouth as needed (at onset of migraine- may repeat once in 2 hours, if no relief- max of 2 doses/24 hours).) 10 tablet 2   traZODone (DESYREL) 100 MG tablet TAKE TWO TABLETS BY MOUTH EVERY NIGHT AT BEDTIME 180 tablet 1   diazepam (VALIUM) 5 MG tablet Take one tablet by mouth with food one hour prior to procedure. May repeat 30 minutes prior if needed. 2 tablet 0   dicyclomine (BENTYL) 20 MG tablet Take 1 tablet (20 mg total) by mouth 2 (two) times daily. (Patient not taking: Reported on 08/18/2022) 20 tablet 0   methylPREDNISolone (MEDROL DOSEPAK)  4 MG TBPK tablet Take per package directions (Patient not taking: Reported on 08/18/2022) 21 tablet 0   nitrofurantoin,  macrocrystal-monohydrate, (MACROBID) 100 MG capsule Take 1 capsule (100 mg total) by mouth 2 (two) times daily. (Patient not taking: Reported on 08/18/2022) 14 capsule 0   No facility-administered medications prior to visit.    Allergies  Allergen Reactions   Naltrexone Nausea Only   Rosuvastatin Nausea Only   Tape Itching and Other (See Comments)    Skin itches if tape is left on for awhile   Tramadol Nausea And Vomiting   Effexor [Venlafaxine] Nausea Only and Rash   Norco [Hydrocodone-Acetaminophen] Rash   Voltaren [Diclofenac] Rash    Review of Systems  Gastrointestinal:  Positive for abdominal pain (upper abdominal pain with occasional lower abd pain -- See HPI) and nausea.       Objective:    Physical Exam Constitutional:      General: She is not in acute distress.    Appearance: Normal appearance. She is well-developed.  HENT:     Head: Normocephalic and atraumatic.     Right Ear: External ear normal.     Left Ear: External ear normal.  Eyes:     General: No scleral icterus. Neck:     Thyroid: No thyromegaly.  Cardiovascular:     Rate and Rhythm: Normal rate and regular rhythm.     Heart sounds: Normal heart sounds. No murmur heard. Pulmonary:     Effort: Pulmonary effort is normal. No respiratory distress.     Breath sounds: Normal breath sounds. No wheezing.  Abdominal:     Tenderness: There is abdominal tenderness in the right lower quadrant and epigastric area. There is no guarding or rebound.  Musculoskeletal:     Cervical back: Neck supple.  Skin:    General: Skin is warm and dry.  Neurological:     Mental Status: She is alert and oriented to person, place, and time.  Psychiatric:        Mood and Affect: Mood normal.        Behavior: Behavior normal.        Thought Content: Thought content normal.        Judgment: Judgment normal.     BP 104/72 (BP Location: Right Arm, Patient Position: Sitting, Cuff Size: Small)   Pulse 92   Temp 98 F (36.7 C)  (Oral)   Resp 16   Wt 199 lb (90.3 kg)   SpO2 99%   BMI 29.39 kg/m  Wt Readings from Last 3 Encounters:  09/08/22 195 lb (88.5 kg)  09/07/22 199 lb (90.3 kg)  09/01/22 207 lb (93.9 kg)       Assessment & Plan:  Nausea Assessment & Plan: Continue zofran prn.   Orders: -     CBC with Differential/Platelet -     Comprehensive metabolic panel -     Lipase  Abdominal pain, unspecified abdominal location Assessment & Plan: New. S/p cholecystectomy. Will obtain US for further evaluation as well as lab work including LFT/lipase.        I,Kadince Rivera,acting as a Neurosurgeon for Lemont Fillers, NP.,have documented all relevant documentation on the behalf of Lemont Fillers, NP,as directed by  Lemont Fillers, NP while in the presence of Lemont Fillers, NP.   I, Lemont Fillers, NP, personally preformed the services described in this documentation.  All medical record entries made by the scribe were at my direction and in my presence.  I have reviewed the chart and discharge instructions (if applicable) and agree that the record reflects my personal performance and is accurate and complete. 09/09/22   Lemont Fillers, NP

## 2022-09-07 ENCOUNTER — Ambulatory Visit: Payer: BC Managed Care – PPO | Admitting: Physical Therapy

## 2022-09-07 ENCOUNTER — Other Ambulatory Visit: Payer: Self-pay | Admitting: Family

## 2022-09-07 ENCOUNTER — Ambulatory Visit (HOSPITAL_BASED_OUTPATIENT_CLINIC_OR_DEPARTMENT_OTHER)
Admission: RE | Admit: 2022-09-07 | Discharge: 2022-09-07 | Disposition: A | Discharge status: Home / Self Care | Payer: BC Managed Care – PPO | Source: Ambulatory Visit | Attending: Family | Admitting: Family

## 2022-09-07 ENCOUNTER — Ambulatory Visit (INDEPENDENT_AMBULATORY_CARE_PROVIDER_SITE_OTHER): Payer: BC Managed Care – PPO | Admitting: Family

## 2022-09-07 VITALS — BP 104/72 | HR 92 | Temp 98.0°F | Resp 16 | Wt 199.0 lb

## 2022-09-07 DIAGNOSIS — R1013 Epigastric pain: Secondary | ICD-10-CM | POA: Diagnosis not present

## 2022-09-07 DIAGNOSIS — Z9049 Acquired absence of other specified parts of digestive tract: Secondary | ICD-10-CM | POA: Diagnosis not present

## 2022-09-07 DIAGNOSIS — R109 Unspecified abdominal pain: Secondary | ICD-10-CM

## 2022-09-07 DIAGNOSIS — R110 Nausea: Secondary | ICD-10-CM | POA: Diagnosis not present

## 2022-09-07 DIAGNOSIS — R1031 Right lower quadrant pain: Secondary | ICD-10-CM | POA: Diagnosis not present

## 2022-09-08 ENCOUNTER — Emergency Department (HOSPITAL_BASED_OUTPATIENT_CLINIC_OR_DEPARTMENT_OTHER)
Admission: EM | Admit: 2022-09-08 | Discharge: 2022-09-08 | Disposition: A | Discharge status: Home / Self Care | Payer: BC Managed Care – PPO | Attending: Emergency Medicine | Admitting: Emergency Medicine

## 2022-09-08 ENCOUNTER — Encounter: Payer: Self-pay | Admitting: Family

## 2022-09-08 ENCOUNTER — Other Ambulatory Visit: Payer: Self-pay

## 2022-09-08 ENCOUNTER — Telehealth: Payer: Self-pay | Admitting: Family

## 2022-09-08 DIAGNOSIS — Z79899 Other long term (current) drug therapy: Secondary | ICD-10-CM | POA: Diagnosis not present

## 2022-09-08 DIAGNOSIS — Z7901 Long term (current) use of anticoagulants: Secondary | ICD-10-CM | POA: Insufficient documentation

## 2022-09-08 DIAGNOSIS — E039 Hypothyroidism, unspecified: Secondary | ICD-10-CM | POA: Diagnosis not present

## 2022-09-08 DIAGNOSIS — R1011 Right upper quadrant pain: Secondary | ICD-10-CM | POA: Diagnosis not present

## 2022-09-08 DIAGNOSIS — R1013 Epigastric pain: Secondary | ICD-10-CM | POA: Diagnosis not present

## 2022-09-08 DIAGNOSIS — R1031 Right lower quadrant pain: Secondary | ICD-10-CM | POA: Diagnosis not present

## 2022-09-08 LAB — COMPREHENSIVE METABOLIC PANEL
ALT: 21 U/L (ref 0–35)
ALT: 23 U/L (ref 0–44)
AST: 18 U/L (ref 0–37)
AST: 18 U/L (ref 15–41)
Albumin: 4.6 g/dL (ref 3.5–5.2)
Albumin: 4.2 g/dL (ref 3.5–5.0)
Alkaline Phosphatase: 61 U/L (ref 39–117)
Alkaline Phosphatase: 54 U/L (ref 38–126)
Anion gap: 10 (ref 5–15)
BUN: 12 mg/dL (ref 6–23)
BUN: 14 mg/dL (ref 6–20)
CO2: 28 mEq/L (ref 19–32)
CO2: 23 mmol/L (ref 22–32)
Calcium: 10.2 mg/dL (ref 8.4–10.5)
Calcium: 9.4 mg/dL (ref 8.9–10.3)
Chloride: 102 mEq/L (ref 96–112)
Chloride: 103 mmol/L (ref 98–111)
Creatinine, Ser: 0.74 mg/dL (ref 0.40–1.20)
Creatinine, Ser: 0.72 mg/dL (ref 0.44–1.00)
GFR, Estimated: >60 mL/min (ref >60)
GFR: 99.81 mL/min (ref >60.00)
Glucose, Bld: 84 mg/dL (ref 70–99)
Glucose, Bld: 85 mg/dL (ref 70–99)
Potassium: 4.1 mEq/L (ref 3.5–5.1)
Potassium: 3.7 mmol/L (ref 3.5–5.1)
Sodium: 139 mEq/L (ref 135–145)
Sodium: 136 mmol/L (ref 135–145)
Total Bilirubin: 0.4 mg/dL (ref 0.2–1.2)
Total Bilirubin: 0.3 mg/dL (ref 0.3–1.2)
Total Protein: 7 g/dL (ref 6.0–8.3)
Total Protein: 6.9 g/dL (ref 6.5–8.1)

## 2022-09-08 LAB — CBC
HCT: 37.5 % (ref 36.0–46.0)
Hemoglobin: 12.4 g/dL (ref 12.0–15.0)
MCH: 28.7 pg (ref 26.0–34.0)
MCHC: 33.1 g/dL (ref 30.0–36.0)
MCV: 86.8 fL (ref 80.0–100.0)
Platelets: 237 10*3/uL (ref 150–400)
RBC: 4.32 MIL/uL (ref 3.87–5.11)
RDW: 13.1 % (ref 11.5–15.5)
WBC: 4.8 10*3/uL (ref 4.0–10.5)
nRBC: 0 % (ref 0.0–0.2)

## 2022-09-08 LAB — CBC WITH DIFFERENTIAL/PLATELET
Basophils Absolute: 0.1 10*3/uL (ref 0.0–0.1)
Basophils Relative: 1.2 % (ref 0.0–3.0)
Eosinophils Absolute: 0.1 10*3/uL (ref 0.0–0.7)
Eosinophils Relative: 1.9 % (ref 0.0–5.0)
HCT: 40 % (ref 36.0–46.0)
Hemoglobin: 13.1 g/dL (ref 12.0–15.0)
Lymphocytes Relative: 40.2 % (ref 12.0–46.0)
Lymphs Abs: 2 10*3/uL (ref 0.7–4.0)
MCHC: 32.9 g/dL (ref 30.0–36.0)
MCV: 86.8 fl (ref 78.0–100.0)
Monocytes Absolute: 0.3 10*3/uL (ref 0.1–1.0)
Monocytes Relative: 6.6 % (ref 3.0–12.0)
Neutro Abs: 2.5 10*3/uL (ref 1.4–7.7)
Neutrophils Relative %: 50.1 % (ref 43.0–77.0)
Platelets: 251 10*3/uL (ref 150.0–400.0)
RBC: 4.6 Mil/uL (ref 3.87–5.11)
RDW: 13.6 % (ref 11.5–15.5)
WBC: 5.1 10*3/uL (ref 4.0–10.5)

## 2022-09-08 LAB — URINALYSIS, MICROSCOPIC (REFLEX)

## 2022-09-08 LAB — URINALYSIS, ROUTINE W REFLEX MICROSCOPIC
Bilirubin Urine: NEGATIVE
Glucose, UA: NEGATIVE mg/dL
Hgb urine dipstick: NEGATIVE
Ketones, ur: 15 mg/dL — AB
Nitrite: NEGATIVE
Protein, ur: NEGATIVE mg/dL
Specific Gravity, Urine: 1.015 (ref 1.005–1.030)
pH: 5 (ref 5.0–8.0)

## 2022-09-08 LAB — LIPASE: Lipase: 133 U/L — ABNORMAL HIGH (ref 11.0–59.0)

## 2022-09-08 LAB — LIPASE, BLOOD: Lipase: 86 U/L — ABNORMAL HIGH (ref 11–51)

## 2022-09-08 MED ORDER — METOCLOPRAMIDE HCL 5 MG/ML IJ SOLN
10.0000 mg | Freq: Once | INTRAMUSCULAR | Status: AC
  Administered 2022-09-08: 10 mg via INTRAVENOUS
  Filled 2022-09-08: qty 2

## 2022-09-08 MED ORDER — SUCRALFATE 1 GM/10ML PO SUSP: 1.0000 g | Freq: Three times a day (TID) | ORAL | 0 refills | Status: DC

## 2022-09-08 MED ORDER — DIPHENHYDRAMINE HCL 25 MG PO CAPS
25.0000 mg | ORAL_CAPSULE | Freq: Once | ORAL | Status: AC
  Administered 2022-09-08: 25 mg via ORAL
  Filled 2022-09-08: qty 1

## 2022-09-08 MED ORDER — MORPHINE SULFATE (PF) 4 MG/ML IV SOLN
4.0000 mg | Freq: Once | INTRAVENOUS | Status: AC
  Administered 2022-09-08: 4 mg via INTRAVENOUS
  Filled 2022-09-08: qty 1

## 2022-09-08 MED ORDER — FAMOTIDINE IN NACL 20-0.9 MG/50ML-% IV SOLN
20.0000 mg | Freq: Once | INTRAVENOUS | Status: AC
  Administered 2022-09-08: 20 mg via INTRAVENOUS
  Filled 2022-09-08: qty 50

## 2022-09-08 MED ORDER — SODIUM CHLORIDE 0.9 % IV BOLUS
1000.0000 mL | Freq: Once | INTRAVENOUS | Status: AC
  Administered 2022-09-08: 1000 mL via INTRAVENOUS

## 2022-09-08 NOTE — Discharge Instructions (Addendum)
Your workup today is reassuring.  I have a somewhat low suspicion for this constellation of symptoms representing pancreatitis.  As we discussed, this is not always the most straightforward diagnosis.  Continue to follow-up with your primary care doctor. Return to emergency room for any new or concerning symptoms  Use Zofran as needed for nausea.  Take the sucralfate that I have prescribed you with each meal I recommend taking at 20 minutes before the meal.  Follow-up with a gastroenterologist I am glad that you are already established with 1.  It may be helpful to do an endoscopy which is a video camera that is used to evaluate your esophagus and stomach.

## 2022-09-08 NOTE — ED Provider Notes (Signed)
[place] [facility] Provider Note   CSN: [id number] Arrival date & time: 09/08/22  1433     History  Chief Complaint  Patient presents with   Abdominal Pain    Kim Pham is a 42 y.o. female.   Abdominal Pain  Patient is a 42 year old female with past medical history significant for anxiety, endometriosis, lupus, intra-abdominal masses, sicca syndrome, fatigue, chronic arthralgias, subclinical hypothyroidism, PE on Eliquis, chronic cholecystitis status post cholecystectomy, status post abdominal hysterectomy and bilateral salpingectomy  Patient presents emergency room today with slightly over 1 month of abdominal pain has been epigastric and across the upper abdomen in general and has been intermittent.  She states that over the past 4 days she has had constant pain however.  She states she had 1 episode of bloody emesis today but has not had any other bloody vomit today and has had emesis since.  She states that her pain is constant and severe she has some difficulty describing it but did settle on the word sharp.  No abnormal vaginal discharge, she does have some diarrhea.  No fevers, no urinary frequency urgency dysuria or hematuria.  No chest pain or difficulty breathing she takes her Eliquis and has not missed any doses in the past month  She was seen at her primary care office yesterday where a right upper quadrant ultrasound was done which did not show much according to patient.  She also had labs done.  She had worsening pain today and called her PCP and they directed her to come to the emergency department.     Home Medications Prior to Admission medications   Medication Sig Start Date End Date Taking? Authorizing Provider  sucralfate (CARAFATE) 1 GM/10ML suspension Take 10 mLs (1 g total) by mouth 3 (three) times daily with meals. 09/08/22  Yes Santanna Whitford, Stevphen Meuse S, PA  acetaminophen (TYLENOL) 500 MG tablet Take 1,000 mg by mouth  every 6 (six) hours as needed for moderate pain.    [provider]  apixaban (ELIQUIS) 5 MG TABS tablet Take 1 tablet (5 mg total) by mouth 2 (two) times daily. 06/28/22   Josph Macho, MD  atorvastatin (LIPITOR) 20 MG tablet Take 1 tablet (20 mg total) by mouth daily. 07/27/22   Sandford Craze, NP  levothyroxine (SYNTHROID) 50 MCG tablet Take 1 tablet (50 mcg total) by mouth daily. 05/19/22   Sandford Craze, NP  liothyronine (CYTOMEL) 5 MCG tablet Take 10 mcg by mouth in the morning.    [provider]  Melatonin 10 MG TABS Take 10 mg by mouth at bedtime as needed (sleep).    [provider]  methocarbamol (ROBAXIN) 500 MG tablet Take 1 tablet (500 mg total) by mouth every 6 (six) hours as needed for muscle spasms. 08/09/22   Persons, [place], PA  Multiple Vitamin (MULTIVITAMIN WITH MINERALS) TABS tablet Take 1 tablet by mouth daily.    [provider]  ondansetron (ZOFRAN-ODT) 4 MG disintegrating tablet Take 1 tablet (4 mg total) by mouth every 8 (eight) hours as needed. 08/08/22   Mardene Sayer, MD  oxyCODONE-acetaminophen (PERCOCET/ROXICET) 5-325 MG tablet Take 1 tablet by mouth every 8 (eight) hours as needed for severe pain or moderate pain. 09/01/22   Juanda Chance, NP  pantoprazole (PROTONIX) 40 MG tablet Take 40 mg by mouth daily as needed (acid reflux).    [provider]  promethazine (PHENERGAN) 25 MG tablet Take 1 tablet (25 mg  total) by mouth every 8 (eight) hours as needed for nausea or vomiting. 05/18/22   Sandford Craze, NP  sertraline (ZOLOFT) 50 MG tablet TAKE 1 TABLET BY MOUTH DAILY 08/22/22   Sandford Craze, NP  SUMAtriptan (IMITREX) 100 MG tablet Take 1 tablet (100 mg total) by mouth at onset of migraine. May repeat in 2 hours if headache persists or recurs. Do not take more than 2 doses in 24 hours. Patient taking differently: Take 100 mg by mouth as needed (at onset of migraine- may repeat once in 2 hours,  if no relief- max of 2 doses/24 hours). 06/29/17   Anson Fret, MD  traZODone (DESYREL) 100 MG tablet TAKE TWO TABLETS BY MOUTH EVERY NIGHT AT BEDTIME 04/13/22   Sandford Craze, NP      Allergies    Naltrexone, Rosuvastatin, Tape, Tramadol, Effexor [venlafaxine], Norco [hydrocodone-acetaminophen], and Voltaren [diclofenac]    Review of Systems   Review of Systems  Gastrointestinal:  Positive for abdominal pain.    Physical Exam Updated Vital Signs BP 115/72 (BP Location: Right Arm)   Pulse 88   Temp 97.7 F (36.5 C)   Resp 17   Ht 5\' 9"  (1.753 m)   Wt 88.5 kg   SpO2 98%   BMI 28.80 kg/m  Physical Exam Vitals and nursing note reviewed.  Constitutional:      General: She is not in acute distress. HENT:     Head: Normocephalic and atraumatic.     Nose: Nose normal.  Eyes:     General: No scleral icterus. Cardiovascular:     Rate and Rhythm: Normal rate and regular rhythm.     Pulses: Normal pulses.     Heart sounds: Normal heart sounds.  Pulmonary:     Effort: Pulmonary effort is normal. No respiratory distress.     Breath sounds: No wheezing.  Abdominal:     Palpations: Abdomen is soft.     Tenderness: There is abdominal tenderness in the right upper quadrant and epigastric area.  Musculoskeletal:     Cervical back: Normal range of motion.     Right lower leg: No edema.     Left lower leg: No edema.  Skin:    General: Skin is warm and dry.     Capillary Refill: Capillary refill takes less than 2 seconds.  Neurological:     Mental Status: She is alert. Mental status is at baseline.  Psychiatric:        Mood and Affect: Mood normal.        Behavior: Behavior normal.     ED Results / Procedures / Treatments   Labs (all labs ordered are listed, but only abnormal results are displayed) Labs Reviewed  LIPASE, BLOOD - Abnormal; Notable for the following components:      Result Value   Lipase 86 (*)    All other components within normal limits   URINALYSIS, ROUTINE W REFLEX MICROSCOPIC - Abnormal; Notable for the following components:   Ketones, ur 15 (*)    Leukocytes,Ua LARGE (*)    All other components within normal limits  URINALYSIS, MICROSCOPIC (REFLEX) - Abnormal; Notable for the following components:   Bacteria, UA FEW (*)    All other components within normal limits  COMPREHENSIVE METABOLIC PANEL  CBC    EKG None  Radiology US Abdomen Limited RUQ (LIVER/GB)  Result Date: 09/07/2022 CLINICAL DATA:  Epigastric pain EXAM: ULTRASOUND ABDOMEN LIMITED RIGHT UPPER QUADRANT COMPARISON:  CT abdomen pelvis 08/08/2022 FINDINGS: Gallbladder:  Surgically absent Common bile duct: Diameter: 4.5 mm Liver: Increased echogenicity. No focal lesion. Portal vein is patent on color Doppler imaging with normal direction of blood flow towards the liver. Other: Right lower quadrant at the site of pain was scanned, no definite abnormality identified. IMPRESSION: 1. Status post cholecystectomy. 2. Increased hepatic parenchymal echogenicity suggestive of steatosis. 3. No definite abnormality identified within the right lower quadrant however this area is difficult to assess ultrasound in adult patients. If there is persistent right lower quadrant pain, CT abdomen and pelvis is recommended. Electronically Signed   By: Annia Belt M.D.   On: 09/07/2022 18:29    Procedures Procedures    Medications Ordered in ED Medications  morphine (PF) 4 MG/ML injection 4 mg (4 mg Intravenous Given 09/08/22 1530)  metoCLOPramide (REGLAN) injection 10 mg (10 mg Intravenous Given 09/08/22 1533)  diphenhydrAMINE (BENADRYL) capsule 25 mg (25 mg Oral Given 09/08/22 1528)  sodium chloride 0.9 % bolus 1,000 mL ( Intravenous Stopped 09/08/22 1635)  famotidine (PEPCID) IVPB 20 mg premix (0 mg Intravenous Stopped 09/08/22 1602)    ED Course/ Medical Decision Making/ A&P Clinical Course as of 09/08/22 1958  Wed Sep 08, 2022  1448 1ep of bloody emesis  [WF]  1449 4 days of  constant pain, intermittent pain and nausea for a few days before.  [WF]    Clinical Course User Index [WF] Gailen Shelter, PA                             Medical Decision Making Amount and/or Complexity of Data Reviewed Labs: ordered.  Risk Prescription drug management.    This patient presents to the ED for concern of abd pain, NV, this involves a number of treatment options, and is a complaint that carries with it a moderate to high risk of complications and morbidity. A differential diagnosis was considered for the patient's symptoms which is discussed below:   The causes of generalized abdominal pain include but are not limited to AAA, mesenteric ischemia, appendicitis, diverticulitis, DKA, gastritis, gastroenteritis, AMI, nephrolithiasis, pancreatitis, peritonitis, adrenal insufficiency,lead poisoning, iron toxicity, intestinal ischemia, constipation, UTI,SBO/LBO, splenic rupture, biliary disease, IBD, IBS, PUD, or hepatitis. Ectopic pregnancy, ovarian torsion, PID.    Co morbidities: Discussed in HPI   Brief History:  Patient is a 42 year old female with past medical history significant for anxiety, endometriosis, lupus, intra-abdominal masses, sicca syndrome, fatigue, chronic arthralgias, subclinical hypothyroidism, PE on Eliquis, chronic cholecystitis status post cholecystectomy, status post abdominal hysterectomy and bilateral salpingectomy  Patient presents emergency room today with slightly over 1 month of abdominal pain has been epigastric and across the upper abdomen in general and has been intermittent.  She states that over the past 4 days she has had constant pain however.  She states she had 1 episode of bloody emesis today but has not had any other bloody vomit today and has had emesis since.  She states that her pain is constant and severe she has some difficulty describing it but did settle on the word sharp.  No abnormal vaginal discharge, she does have some  diarrhea.  No fevers, no urinary frequency urgency dysuria or hematuria.  No chest pain or difficulty breathing she takes her Eliquis and has not missed any doses in the past month  She was seen at her primary care office yesterday where a right upper quadrant ultrasound was done which did not show much according to patient.  She also had labs done.  She had worsening pain today and called her PCP and they directed her to come to the emergency department.    EMR reviewed including pt PMHx, past surgical history and past visits to ER.   See HPI for more details   Lab Tests:   I personally reviewed all laboratory work and imaging. Metabolic panel without any acute abnormality specifically kidney function within normal limits and no significant electrolyte abnormalities. CBC without leukocytosis or significant anemia. Lipase marginally elevated to 86 it seems that it has been elevated numerous times in the past even when patient was here for upper chest pain.  Low suspicion that this represents any form of pancreatitis.  Imaging Studies:  No imaging studies ordered for this patient I did not obtain any imaging studies at this visit however I did personally review images from CT scan abdomen pelvis that was done 1 month ago.  It was unremarkable   Cardiac Monitoring:  NA NA   Medicines ordered:  I ordered medication including 1 L normal saline, Pepcid, Reglan, morphine, Benadryl for abdominal pain nausea Reevaluation of the patient after these medicines showed that the patient improved I have reviewed the patients home medicines and have made adjustments as needed   Critical Interventions:     Consults/Attending Physician      Reevaluation:  After the interventions noted above I re-evaluated patient and found that they have :resolved   Social Determinants of Health:      Problem List / ED Course:  Patient with 1 month of abdominal pain has had CT abdomen  pelvis 1 month ago which was without any evidence of pancreatitis has had chronically elevated lipase levels.  Her abdominal pain seems to come on after she eats she did have a small amount of blood and 1 episode of her emesis but no other episodes.  She is not an alcoholic she does not drink alcohol at all intact.  She is already had her gallbladder out and she had a upper abdominal ultrasound that did not show anything today per her report.  I do see the labs that were done earlier today however I was also able to look at the results of the ultrasound which were understandably nonspecific however did not show any acute abnormal findings.  Given that her symptoms seem to occur after she eats.  She has had some scant blood with emesis I think is reasonable to consider him gastritis versus stomach ulcer versus perhaps H. pylori given that she is frequently belching.  She is already on PPI.  Will add on sucralfate and recommend close gastroenterology follow-up she is followed by Dr. Chales Abrahams with [facility].  Return precautions provided.   Dispostion:  After consideration of the diagnostic results and the patients response to treatment, I feel that the patent would benefit from DC home.   Final Clinical Impression(s) / ED Diagnoses Final diagnoses:  Epigastric pain  RUQ abdominal pain    Rx / DC Orders ED Discharge Orders          Ordered    sucralfate (CARAFATE) 1 GM/10ML suspension  3 times daily with meals        09/08/22 1656              Gailen Shelter, [place] 09/08/22 1959    Loetta Rough, MD 09/08/22 2318

## 2022-09-08 NOTE — Telephone Encounter (Signed)
Spoke to pt re: elevated lipase and likely pancreatitis. Recommended clear liquids for the next few days, zofran prn nausea, continued alcohol avoidance, ER if severe/worsening pain or nausea.  Follow up with me in 1 week. Pt verbablizes understading.

## 2022-09-08 NOTE — ED Triage Notes (Signed)
Patient presents to ED via POV from home. Here with abdominal pain x 3 days. Poor PO intake, nausea and vomiting. Recent dx of pancreatitis.

## 2022-09-09 ENCOUNTER — Telehealth: Payer: Self-pay | Admitting: Gastroenterology

## 2022-09-09 DIAGNOSIS — R110 Nausea: Secondary | ICD-10-CM | POA: Insufficient documentation

## 2022-09-09 DIAGNOSIS — R109 Unspecified abdominal pain: Secondary | ICD-10-CM | POA: Insufficient documentation

## 2022-09-09 NOTE — Telephone Encounter (Signed)
Spoke with patient regarding MD recommendations & advised her that I have her on the top of my list for if a cancellation becomes available, and if not will book in urgent triage spot once schedule becomes available. Also advised she reach out to PCP office to see if they can see her sooner. Pt verbalized all understanding.

## 2022-09-09 NOTE — Assessment & Plan Note (Signed)
New. S/p cholecystectomy. Will obtain US for further evaluation as well as lab work including LFT/lipase.

## 2022-09-09 NOTE — Telephone Encounter (Signed)
Dr. Chales Abrahams,   This pt has been experiencing epigastric abdominal pain. Had episode of hematemesis yesterday and was seen in the ER.  Lipase has also been elevated.  Could your office please try to work her in for a close follow up visit?  Thank you!  Florinda Marker- I just saw Dr. Urban Gibson out of office- maybe one of the APP's can see her in his absence?   Thanks!

## 2022-09-09 NOTE — Assessment & Plan Note (Signed)
-   Continue zofran prn

## 2022-09-09 NOTE — Telephone Encounter (Signed)
If symptoms worsen then to ED for mgmt. Check with Lavonna Rua to get her a sooner urgent APP appt as soon ass the urgent schedule is opened.

## 2022-09-09 NOTE — Telephone Encounter (Signed)
Patient called in with ongoing complaints of cramping, upper abdominal pain (6/10), nausea w/vomiting (with blood occasionally), and weight loss over the last three weeks. Denies any bleeding today. She is only able to tolerate fluids. PCP ordered labs & [place], and told her they thought she had pancreatitis. Pain increased, so she was seen at [facility] ED yesterday & was prescribed carafate that she still needs to start. ED discharged her & asked that she follow up with Dr. Chales Abrahams soon in office. PCP also messaged in today asking if patient could have sooner appointment d/t symptoms & elevated lipase. Scheduled patient for soonest OV with Dr. Chales Abrahams on 11/08/21 at 8:50 am, no sooner APP visits. Advised her to reach out to PCP in the meantime, advised she start carafate prescription as prescribed, and discussed ED precautions. Will route to DOD to make them aware & advise on any further recommendations.

## 2022-09-10 ENCOUNTER — Ambulatory Visit: Payer: BC Managed Care – PPO

## 2022-09-10 DIAGNOSIS — M5416 Radiculopathy, lumbar region: Secondary | ICD-10-CM

## 2022-09-10 DIAGNOSIS — M5440 Lumbago with sciatica, unspecified side: Secondary | ICD-10-CM | POA: Diagnosis not present

## 2022-09-10 DIAGNOSIS — R252 Cramp and spasm: Secondary | ICD-10-CM | POA: Diagnosis not present

## 2022-09-10 NOTE — Therapy (Addendum)
OUTPATIENT PHYSICAL THERAPY TREATMENT   Patient Name: Kim Pham MRN: [id number] DOB:[birth year], 42 y.o., female Today's Date: 09/10/2022  END OF SESSION:  PT End of Session - 09/10/22 0803     Visit Number 5    Number of Visits 12    Date for PT Re-Evaluation 09/29/22    Authorization Type BC[employer]    PT Start Time 0800    PT Stop Time 0844    PT Time Calculation (min) 44 min    Activity Tolerance Patient tolerated treatment well    Behavior During Therapy [facility] for tasks assessed/performed               Past Medical History:  Diagnosis Date   Acute pulmonary embolism without acute cor pulmonale (HCC) 05/17/2022   Adnexal mass 02/24/2021   in epic seen on [place]   Amenorrhea 08/14/2021   Anxiety 08/14/2021   Arthralgia 08/18/2016   Back pain 09/04/2021   Blood in stool 09/08/2021   Cardiac murmur 12/16/2021   Chest discomfort 04/20/2022   Cholecystitis 05/26/2022   Chondromalacia, patella 06/30/2021   Chronic calculous cholecystitis 06/08/2022   Chronic constipation 08/14/2021   Chronic headaches HISTORY OF MIGRAINES 08/14/2021   Chronic migraine without aura, with intractable migraine, so stated, with status migrainosus 07/27/2017   Complication of anesthesia 08/14/2021   PONV   Constipation 06/11/2021   Coronary artery calcification seen on CT scan 12/16/2021   Cyst of left ovary 06/17/2021   Degenerative disc disease, lumbar 09/04/2019   Depression with anxiety 03/26/2016   Diarrhea 07/08/2017   DOE (dyspnea on exertion) 04/20/2022   Effusion, left knee 12/16/2021   Endometriosis    Family history of BRCA gene mutation 06/17/2021   [facility] genetic testing done 06/2021 - negative for breast cancer genes.   Family history of breast cancer 09/09/2015   Formatting of this note might be different from the original.  Mom dx'd age 19  Mat aunt dx'd age 74  Neither relative tested for BRCA  Formatting of this note might be different from the original.  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 (HCC)    High risk medication use 11/22/2016   history of Chiari malformation    History of Chiari malformation 11/22/2016   history of COVID 02/2021   LOW GRADE FEVER CONGESTION AND HEADACHE ALL SYMPTOMS RESOLVED   Hot flashes 06/11/2021   Hyperlipidemia    Insomnia 08/19/2020   Low blood pressure    pt states history of low blood pressure-fainted 12/2010-instructed to move slowly   Nipple discharge 05/19/2022   Nonepileptic episode (HCC) 12/22/2016   Numbness and tingling 02/25/2016   Obesity (BMI 30.0-34.9) 12/16/2021   On continuous oral anticoagulation 05/17/2022   OSTEOARTHRITIS 08/14/2021   Other epilepsy, intractable, without status epilepticus (HCC) 11/01/2016   Pain in right knee 11/13/2020   Pain in unspecified joint 08/18/2016   Palpitations 12/15/2021   Pelvic pain in female 08/18/2021   Photosensitivity 11/22/2016   PONV (postoperative nausea and vomiting)    Preventative health care 09/08/2021   Psychogenic nonepileptic seizure 07/27/2017   Pulmonary embolism (HCC)    Raynaud's disease without gangrene 11/22/2016   Rectal bleeding 06/11/2021   RUQ pain 05/07/2022   Sacroiliac joint dysfunction of right side 12/19/2019   Sicca  syndrome (HCC) 11/22/2016   SLE (systemic lupus erythematosus) (HCC) 03/26/2016   Status post laparoscopic cholecystectomy 06/08/2022   Subclinical hypothyroidism    Thyroid nodule    Transient alteration of awareness 01/01/2016   Vasomotor symptoms due to menopause 06/17/2021   Voice hoarseness 05/07/2022   Wears glasses for reading 08/14/2021   Past Surgical History:  Procedure Laterality Date   ABDOMINAL HYSTERECTOMY  2007   CHOLECYSTECTOMY N/A 06/08/2022   Procedure: LAPAROSCOPIC CHOLECYSTECTOMY WITH ICG;   Surgeon: Almond Lint, MD;  Location: [facility];  Service: General;  Laterality: N/A;   CYSTOSCOPY  2007   WITH HYSTERECTOMY   DIAGNOSTIC LAPAROSCOPY  2005   OOPHORECTOMY Left 08/18/2021   ROTATOR CUFF REPAIR Right 2010   SALPINGOOPHORECTOMY  04/16/2011   Procedure: SALPINGO OOPHERECTOMY;  Surgeon: Lenoard Aden, MD;  Location: [facility];  Service: Gynecology;  Laterality: Right;   skull decompression surgery 2005     @ [facility]   Patient Active Problem List   Diagnosis Date Noted   Nausea 09/09/2022   Abdominal pain 09/09/2022   Migraines 07/27/2022   Alcohol abuse 06/25/2022   Status post laparoscopic cholecystectomy 06/08/2022   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 (HCC) 05/27/2022   On continuous oral anticoagulation 05/17/2022   Pulmonary embolism (HCC) 05/11/2022   RUQ pain 05/07/2022   Voice hoarseness 05/07/2022   Thyroid nodule 05/04/2022   Former smoker 04/20/2022   Subclinical hypothyroidism 12/18/2021   Effusion, left knee 12/16/2021   Endometriosis 12/16/2021   Cardiac murmur 12/16/2021   Obesity (BMI 30.0-34.9) 12/16/2021   Coronary artery calcification seen on CT scan 12/16/2021   Preventative health care 09/08/2021   Hyperlipidemia 09/08/2021   Amenorrhea 08/14/2021   Anxiety 08/14/2021   Chronic constipation 08/14/2021   Chronic headaches HISTORY OF MIGRAINES 08/14/2021   Complication of anesthesia 08/14/2021   HISTORY OF MIGRAINES 08/14/2021   OSTEOARTHRITIS 08/14/2021   Wears glasses for reading 08/14/2021   Chondromalacia, patella 06/30/2021   Family history of BRCA gene mutation 06/17/2021   Cyst of left ovary 06/17/2021   Vasomotor symptoms due to menopause 06/17/2021   Rectal bleeding 06/11/2021   Hot flashes 06/11/2021   Insomnia 08/19/2020   Gastroesophageal reflux disease 08/19/2020   Sacroiliac joint dysfunction of right side 12/19/2019   Degenerative disc disease, lumbar 09/04/2019   Psychogenic nonepileptic seizure 12/22/2016    Sicca syndrome (HCC) 11/22/2016   Raynaud's disease without gangrene 11/22/2016   History of Chiari malformation 11/22/2016   High risk medication use 11/22/2016   Other epilepsy, intractable, without status epilepticus (HCC) 11/01/2016   Arthralgia 08/18/2016   SLE (systemic lupus erythematosus) (HCC) 03/26/2016   Depression with anxiety 03/26/2016   Transient alteration of awareness 01/01/2016   Chiari malformation 12/19/2015   Family history of breast cancer 09/09/2015    PCP: Sandford Craze, NP  REFERRING PROVIDER: Persons, [place], [place]   REFERRING DIAG:  M54.16 (ICD-10-CM) - Lumbar radiculopathy  M51.16 (ICD-10-CM) - Intervertebral disc disorders with radiculopathy, lumbar region  M47.816 (ICD-10-CM) - Facet arthropathy, lumbar    Rationale for Evaluation and Treatment: Rehabilitation  THERAPY DIAG:  Radiculopathy, lumbar region  Cramp and spasm  ONSET DATE: 4 weeks  prior to eval  SUBJECTIVE:  SUBJECTIVE STATEMENT: Pt notes improvement with daily activities. Feels some improvement from the [facility]. N/T going down the leg is less intense.  PERTINENT HISTORY:  PE, Raynaud's, migraines, lupus, former smoker  PAIN:  Are you having pain? Yes: NPRS scale: 5/10 Pain location: right side back radiating down R leg to foot, numbness R thigh, R heel  Pain description: deep ache in back, throbbing pain in leg Aggravating factors: standing >10 min,  sitting > 1 hour if can reposition in comfortable chair, driving too long, bending over and picking up anything heavy, sleeping on right side.  Relieving factors: heat pack, repositioning, percocet (takes if level 10)  PRECAUTIONS: None  WEIGHT BEARING RESTRICTIONS: No  FALLS:  Has patient fallen in last 6 months? Yes. Number of falls 1-  dog tripped her.  Not normally off balance.   LIVING ENVIRONMENT: Lives with: lives with their spouse Lives in: House/apartment Stairs: Yes: Internal: 15 steps; on right going up to basement, difficulty going up Has following equipment at home: None  OCCUPATION: hospice nurse   PLOF: Independent and Leisure: walk 1-2 miles/day  PATIENT GOALS: avoid surgery, decrease pain  NEXT MD VISIT: not scheduled at this time, should be in couple of weeks.   OBJECTIVE:   DIAGNOSTIC FINDINGS:  MRI  lumbar spine 08/10/22 Narrative & Impression  FINDINGS: Segmentation: Standard. Lowest well-formed disc space labeled the L5-S1 level.   Alignment: Trace levoscoliosis. Alignment otherwise normal with preservation of the normal lumbar lordosis. No significant listhesis.   Vertebrae: Vertebral body height maintained without acute or chronic fracture. Bone marrow signal intensity within normal limits. No discrete or worrisome osseous lesions. No abnormal marrow edema.   Conus medullaris and cauda equina: Conus extends to the L2-3 level. Conus and cauda equina appear normal.   Paraspinal and other soft tissues: Unremarkable.   Disc levels:   L1-2: Mild disc bulge. Mild bilateral facet hypertrophy. No spinal stenosis. Foramina remain patent.   L2-3: Mild annular disc bulge. Mild right greater than left facet hypertrophy. No spinal stenosis. Foramina remain patent.   L3-4: Mild diffuse disc bulge. Superimposed left foraminal to extraforaminal disc protrusion contacts the exiting left L3 nerve root. Mild facet and ligament flavum hypertrophy. Resultant mild spinal stenosis. Foramina remain adequately patent.   L4-5: Disc desiccation with mild disc bulge. Right foraminal to extraforaminal disc protrusion with annular fissure contacts the exiting right L4 nerve root (series 8, image 30). Mild to moderate bilateral facet hypertrophy. Resultant mild narrowing of the right lateral recess.  Central canal remains patent. Mild bilateral L4 foraminal stenosis.   L5-S1: Disc desiccation with minimal annular disc bulge. No canal or lateral recess stenosis. Foramina remain patent.   IMPRESSION: 1. Left foraminal to extraforaminal disc protrusion at L3-4, contacting the exiting left L3 nerve root. 2. Right foraminal to extraforaminal disc protrusion at L4-5, contacting the exiting right L4 nerve root. 3. Lower lumbar facet hypertrophy, most pronounced at L3-4 and L4-5. Finding could contribute to lower back pain.    PATIENT SURVEYS:  Modified Oswestry 24/50 = 48%   SCREENING FOR RED FLAGS: Bowel or bladder incontinence: No Spinal tumors: No Cauda equina syndrome: No Compression fracture: No Abdominal aneurysm: No  COGNITION: Overall cognitive status: Within functional limits for tasks assessed     SENSATION: Light touch: Impaired  25% diminished over R L3/4 dermatomes  MUSCLE LENGTH: Hamstrings: moderate tightness bilaterally  POSTURE: No Significant postural limitations  PALPATION: Tenderness with PA mobs L2-4, sacrum, R lumbar paraspinals, R glutes,  R piriformis  LUMBAR ROM:   AROM eval 09/10/22  Flexion To mid shin limited by pain Hands to mid shin  Extension Limited 75%, p! Limited 60%- mild pain  Right lateral flexion Past knee To knee jt line  Left lateral flexion To knee, p! To knee jt line- R side pain  Right rotation Limited 25%, p! WNL  Left rotation No limitation WNL   (Blank rows = not tested)  LOWER EXTREMITY ROM:   WNL, symmetric bilaterally   LOWER EXTREMITY MMT:    MMT Right eval Left eval  Hip flexion 5 5  Hip extension 4 (limited by pain) 5  Hip abduction 5 5  Hip adduction 5 5  Knee flexion 5 5  Knee extension 5 5  Ankle dorsiflexion 5 5  Ankle plantarflexion 5 5   (Blank rows = not tested)  LUMBAR SPECIAL TESTS:  Straight leg raise test: Positive  FUNCTIONAL TESTS:   GAIT: Distance walked: 48' Assistive device  utilized: None Level of assistance: Complete Independence Comments: WFL  TODAY'S TREATMENT:                                                                                                                              DATE:  09/10/2022 Therapeutic Exercise: to improve strength and mobility.  Demo, verbal and tactile cues throughout for technique. Bike L2 x 6 min Standing hip abduction 2x10 2# 2HHA Standing hip extension 2x10 2# 2HHA Standing marches 2x10 2# 2HHA Extension at wall 10x3" Lateral bend at wall  BATCA rows 2x10 20# Lat pulls 20# 2x10 3 way Child pose stretch  x 30 secs Figure 4 stretch R/L x 30 secs Supine LTR both ways 10x Supine pelvic tilts x 10 09/03/2022 Therapeutic Exercise: to improve strength and mobility.  Demo, verbal and tactile cues throughout for technique. Bike L2 x 5 min  Mckenzie side glides - preference for L side to wall, followed by extension at wall x 10  Prone knee bends x 10 Prone leg extension x 10 bil - uncomfortable.  After Manual therapy - seated piriformis stretches.  Manual Therapy: to decrease muscle spasm and pain and improve mobility STM/TPR to R glutes/piriformis, lumbar paraspinals, R UPA mobs grade 2-3 to lumbar spine, skilled palpation and monitoring during dry needling.  IASTM with foam roller to R glute post TrDN.  Trigger Point Dry-Needling  Treatment instructions: Expect mild to moderate muscle soreness. S/S of pneumothorax if dry needled over a lung field, and to seek immediate medical attention should they occur. Patient verbalized understanding of these instructions and education. Patient Consent Given: Yes Education handout provided: Yes Muscles treated: R glut med, R piriformis Electrical stimulation performed: No Parameters: N/A Treatment response/outcome: Twitch Response Elicited and Palpable Increase in Muscle Length   08/25/22 Bike L3 x 5 min Prone lying centralizes pain almost immediately pain 4-5/10 Prone press ups x 10  no change, x 10 with exhale on updog - increased R  leg pain to shin 5/10 Prone over one pillow x 1 min centralizes pain again Prone knee flexion with pelvic press x 10 ea  Manual: gentle PA mobs lumbar spine gd II; TPR in L S/L to R QL, STM to R lumbar Self MFR with ball to lumbar gluteals Manual traction in hooklying with belt x 20 sec tried to assess tolerance to mechanical tx.  Modalities: supine Mechanical lumbar traction Intermittent 60/20 75#/50# x 15 min (wt 205#)   08/23/22 Therapeutic Exercise: to improve strength and mobility.   UBE L1.0 3 min fwd/ 3 min back Prone knee flexion x 10 B Prone on elbows x 1 min hold  Prone press up 5x5" Prone hip extension x 10 B Supine bridge green TB 2 x 10  Supine clams green TB 10x5" Seated piriformis and glute med stretch x 30 sec  each B Standing lumbar extension 10x3"  Manual Therapy: to decrease muscle spasm, pain and improve mobility IASTM with foam roll to lumbar paraspinals   08/18/22 Self Care: Education on exercises, goal to centralize symptoms, stop if exercises cause symptoms to peripheralize and inform PT, if symptoms centralize and pain slightly increases in low back this is ok.  Starting with prone lying x 3 min, then bending R leg x 10 to point of tightness not pain to floss nerve.     PATIENT EDUCATION:  Education details: HEP update- child pose and pelvic tilt Person educated: Patient Education method: Explanation, Demonstration, Verbal cues, and Handouts Education comprehension: verbalized understanding and returned demonstration  HOME EXERCISE PROGRAM: Access Code: [email or web address] URL: [email or web address] Date: 09/10/2022 Prepared by: Verta Ellen  Exercises - Prone Knee Flexion  - 3 x daily - 7 x weekly - 1 sets - 10 reps - Prone Press Up On Elbows  - 1 x daily - 7 x weekly - 3 sets - 3 reps - 1  min hold - Prone Press Up  - 1 x daily - 7 x weekly - 3 sets - 5 reps - 5- 10 second hold - Supine Bridge  with Resistance Band  - 1 x daily - 7 x weekly - 3 sets - 10 reps - Seated Piriformis Stretch with Trunk Bend  - 1 x daily - 7 x weekly - 3 reps - 30 second hold - Standing Quadratus Lumborum Mobilization with Small Ball on Wall  - 1 x daily - 7 x weekly - 3 sets - 10 reps - Left Standing Lateral Shift Correction at Wall - Repetitions  - 5 x daily - 7 x weekly - 1 sets - 10 reps - Standing Lumbar Extension with Counter  - 5 x daily - 7 x weekly - 1 sets - 10 reps - Standing Lumbar Extension at Wall - Forearms  - 5 x daily - 7 x weekly - 1 sets - 10 reps - Supine Pelvic Tilt  - 1 x daily - 7 x weekly - 3 sets - 10 reps - Child's Pose Stretch  - 1 x daily - 7 x weekly - 3 sets - 3 reps - 30 sec hold  Patient Education - Posture and Body Mechanics  ASSESSMENT:  CLINICAL IMPRESSION: Pt entered clinic with improved reports of R LE radicular pain. Advanced exercises focusing on lumbar strengthening, hip strengthening, and flexibility. Most cues required with child pose stretch for correct form. Improved lumbar extension based on ROM assessment. No complaints after session.       OBJECTIVE IMPAIRMENTS: decreased activity tolerance, decreased mobility, decreased  ROM, increased fascial restrictions, increased muscle spasms, impaired flexibility, impaired sensation, and pain.   ACTIVITY LIMITATIONS: carrying, lifting, bending, sitting, standing, sleeping, stairs, transfers, bed mobility, locomotion level, and caring for others  PARTICIPATION LIMITATIONS: meal prep, cleaning, laundry, driving, shopping, community activity, and occupation  PERSONAL FACTORS: Profession and 3+ comorbidities: PE, Raynaud's, migraines, lupus, former smoker  are also affecting patient's functional outcome.   REHAB POTENTIAL: Good  CLINICAL DECISION MAKING: Evolving/moderate complexity  EVALUATION COMPLEXITY: Moderate   GOALS: Goals reviewed with patient? Yes  SHORT TERM GOALS: Target date: 09/01/2022  Patient  will be independent with initial HEP.  Baseline: Goal status: MET 09/03/22  2.  Patient will report centralization of radicular symptoms.  Baseline: radiating down RLE Goal status: IN PROGRESS 09/03/22- no pain but intermittent numbness down RLE   LONG TERM GOALS: Target date: 09/29/2022    Patient will be independent with advanced/ongoing HEP to improve outcomes and carryover.  Baseline:  Goal status: IN PROGRESS  2.  Patient will report 75% improvement in low back pain to improve QOL.  Baseline:  Goal status: IN PROGRESS  3.  Patient will demonstrate full pain free lumbar ROM to perform ADLs.   Baseline: see objective Goal status: IN PROGRESS- 09/10/22 improved  4.  Patient will report at least 6 points improvement on modified Oswestry to demonstrate improved functional ability.  Baseline: 24/50 Goal status: IN PROGRESS   5.  Patient will tolerate 60 min of walking to exercise. Baseline: used to walk 1-2 miles/day Goal status: IN PROGRESS  6.  Patient will be able to perform all job duties without limitation from low back pain. 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  PLAN FOR NEXT SESSION: progress exercises focusing on mckenzie extension and neutral spine, manual therapy, modalities PRN.  Also standing core stabilization exercises.   Darleene Cleaver, PTA 09/10/2022, 8:45 AM   PHYSICAL THERAPY DISCHARGE SUMMARY  Visits from Start of Care: 5  Current functional level related to goals / functional outcomes: Decreased LBP, improved  tolerance to ADLs   Remaining deficits: Continued Radicular symptoms and LBP   Education / Equipment: HEP  Plan: Patient goals were not met. Patient discontinued PT after 09/10/22 visit due to change in work schedule and has not been seen since that date.  Since then she has had surgery for this problem and would require new referral to return to PT.     Jena Gauss, PT, DPT  10:03 AM 11/15/2022

## 2022-09-14 ENCOUNTER — Ambulatory Visit (INDEPENDENT_AMBULATORY_CARE_PROVIDER_SITE_OTHER): Payer: BC Managed Care – PPO | Admitting: Family

## 2022-09-14 ENCOUNTER — Encounter: Payer: Self-pay | Admitting: Hematology & Oncology

## 2022-09-14 ENCOUNTER — Encounter: Payer: Self-pay | Admitting: Gastroenterology

## 2022-09-14 VITALS — BP 94/53 | HR 98 | Temp 98.6°F | Resp 16 | Wt 196.0 lb

## 2022-09-14 DIAGNOSIS — K859 Acute pancreatitis without necrosis or infection, unspecified: Secondary | ICD-10-CM | POA: Diagnosis not present

## 2022-09-14 DIAGNOSIS — K219 Gastro-esophageal reflux disease without esophagitis: Secondary | ICD-10-CM | POA: Diagnosis not present

## 2022-09-14 LAB — CBC WITH DIFFERENTIAL/PLATELET
Basophils Absolute: 0 10*3/uL (ref 0.0–0.1)
Basophils Relative: 1 % (ref 0.0–3.0)
Eosinophils Absolute: 0.1 10*3/uL (ref 0.0–0.7)
Eosinophils Relative: 3.3 % (ref 0.0–5.0)
HCT: 38.6 % (ref 36.0–46.0)
Hemoglobin: 12.6 g/dL (ref 12.0–15.0)
Lymphocytes Relative: 50.8 % — ABNORMAL HIGH (ref 12.0–46.0)
Lymphs Abs: 1.9 10*3/uL (ref 0.7–4.0)
MCHC: 32.6 g/dL (ref 30.0–36.0)
MCV: 87.1 fl (ref 78.0–100.0)
Monocytes Absolute: 0.3 10*3/uL (ref 0.1–1.0)
Monocytes Relative: 7.5 % (ref 3.0–12.0)
Neutro Abs: 1.4 10*3/uL (ref 1.4–7.7)
Neutrophils Relative %: 37.4 % — ABNORMAL LOW (ref 43.0–77.0)
Platelets: 223 10*3/uL (ref 150.0–400.0)
RBC: 4.43 Mil/uL (ref 3.87–5.11)
RDW: 13.9 % (ref 11.5–15.5)
WBC: 3.8 10*3/uL — ABNORMAL LOW (ref 4.0–10.5)

## 2022-09-14 LAB — COMPREHENSIVE METABOLIC PANEL
ALT: 21 U/L (ref 0–35)
AST: 18 U/L (ref 0–37)
Albumin: 4.3 g/dL (ref 3.5–5.2)
Alkaline Phosphatase: 58 U/L (ref 39–117)
BUN: 9 mg/dL (ref 6–23)
CO2: 26 mEq/L (ref 19–32)
Calcium: 9.5 mg/dL (ref 8.4–10.5)
Chloride: 105 mEq/L (ref 96–112)
Creatinine, Ser: 0.76 mg/dL (ref 0.40–1.20)
GFR: 96.65 mL/min (ref >60.00)
Glucose, Bld: 77 mg/dL (ref 70–99)
Potassium: 4.1 mEq/L (ref 3.5–5.1)
Sodium: 142 mEq/L (ref 135–145)
Total Bilirubin: 0.4 mg/dL (ref 0.2–1.2)
Total Protein: 6.4 g/dL (ref 6.0–8.3)

## 2022-09-14 LAB — LIPASE: Lipase: 79 U/L — ABNORMAL HIGH (ref 11.0–59.0)

## 2022-09-14 MED ORDER — SUCRALFATE 1 G PO TABS: 1.0000 g | ORAL_TABLET | Freq: Three times a day (TID) | ORAL | 0 refills | Status: DC

## 2022-09-14 NOTE — Progress Notes (Signed)
Subjective:   By signing my name below, I, Doylene Bode, attest that this documentation has been prepared under the direction and in the presence of Lemont Fillers, NP 09/14/22   Patient ID: Kim Pham, female    DOB: 06/04/1980, 42 y.o.   MRN: [id number]  Chief Complaint  Patient presents with   Abdominal Pain    Here for follow up.     HPI Patient is in today for a one week follow-up.  Abdominal Pain: Seen in ED 09/08/22 for epigastric pain. She had been vomiting bright red blood with clots. [place] RUQ showed: 1) Status post cholecystectomy, 2) increased hepatic parenchymal echogenicity suggestive of steatosis, 3) no definite abnormality identified within the right lower quadrant however this area is difficult to assess ultrasound in adult patients. She is waiting on GI consult. Still feeling very nauseous and has continued abdominal pain, reduced appetite. Stool darker than normal but not black. Taking pantoprazole 40 mg daily as needed. Not taking sucralfate due to high cost for suspension. Takes promethazine 25 mg every eight hours as needed.  Past Medical History:  Diagnosis Date   Acute pulmonary embolism without acute cor pulmonale (HCC) 05/17/2022   Adnexal mass 02/24/2021   in epic seen on [place]   Amenorrhea 08/14/2021   Anxiety 08/14/2021   Arthralgia 08/18/2016   Back pain 09/04/2021   Blood in stool 09/08/2021   Cardiac murmur 12/16/2021   Chest discomfort 04/20/2022   Cholecystitis 05/26/2022   Chondromalacia, patella 06/30/2021   Chronic calculous cholecystitis 06/08/2022   Chronic constipation 08/14/2021   Chronic headaches HISTORY OF MIGRAINES 08/14/2021   Chronic migraine without aura, with intractable migraine, so stated, with status migrainosus 07/27/2017   Complication of anesthesia 08/14/2021   PONV   Constipation 06/11/2021   Coronary artery calcification seen on CT scan 12/16/2021   Cyst of left ovary 06/17/2021   Degenerative disc disease,  lumbar 09/04/2019   Depression with anxiety 03/26/2016   Diarrhea 07/08/2017   DOE (dyspnea on exertion) 04/20/2022   Effusion, left knee 12/16/2021   Endometriosis    Family history of BRCA gene mutation 06/17/2021   [facility] genetic testing done 06/2021 - negative for breast cancer genes.   Family history of breast cancer 09/09/2015   Formatting of this note might be different from the original.  Mom dx'd age 50  Mat aunt dx'd age 82  Neither relative tested for BRCA  Formatting of this note might be different from the original.  Mom dx'd age 53  Mat aunt dx'd age 67  Neither relative tested for BRCA   Fatigue 03/26/2016   Former smoker 04/20/2022   Gastroesophageal reflux disease 08/19/2020   Hair loss 11/22/2016   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 (HCC)    High risk medication use 11/22/2016   history of Chiari malformation    History of Chiari malformation 11/22/2016   history of COVID 02/2021   LOW GRADE FEVER CONGESTION AND HEADACHE ALL SYMPTOMS RESOLVED   Hot flashes 06/11/2021   Hyperlipidemia    Insomnia 08/19/2020   Low blood pressure    pt states history of low blood pressure-fainted 12/2010-instructed to move slowly   Nipple discharge 05/19/2022   Nonepileptic episode (HCC) 12/22/2016   Numbness and tingling 02/25/2016   Obesity (BMI 30.0-34.9) 12/16/2021   On continuous oral anticoagulation 05/17/2022   OSTEOARTHRITIS 08/14/2021   Other epilepsy, intractable, without status epilepticus (HCC) 11/01/2016   Pain in right knee 11/13/2020   Pain in unspecified  joint 08/18/2016   Palpitations 12/15/2021   Pelvic pain in female 08/18/2021   Photosensitivity 11/22/2016   PONV (postoperative nausea and vomiting)    Preventative health care 09/08/2021   Psychogenic nonepileptic seizure 07/27/2017   Pulmonary embolism (HCC)    Raynaud's disease without gangrene 11/22/2016   Rectal bleeding 06/11/2021   RUQ pain 05/07/2022   Sacroiliac joint dysfunction of right  side 12/19/2019   Sicca syndrome (HCC) 11/22/2016   SLE (systemic lupus erythematosus) (HCC) 03/26/2016   Status post laparoscopic cholecystectomy 06/08/2022   Subclinical hypothyroidism    Thyroid nodule    Transient alteration of awareness 01/01/2016   Vasomotor symptoms due to menopause 06/17/2021   Voice hoarseness 05/07/2022   Wears glasses for reading 08/14/2021    Past Surgical History:  Procedure Laterality Date   ABDOMINAL HYSTERECTOMY  2007   CHOLECYSTECTOMY N/A 06/08/2022   Procedure: LAPAROSCOPIC CHOLECYSTECTOMY WITH ICG;  Surgeon: Almond Lint, MD;  Location: [facility];  Service: General;  Laterality: N/A;   CYSTOSCOPY  2007   WITH HYSTERECTOMY   DIAGNOSTIC LAPAROSCOPY  2005   OOPHORECTOMY Left 08/18/2021   ROTATOR CUFF REPAIR Right 2010   SALPINGOOPHORECTOMY  04/16/2011   Procedure: SALPINGO OOPHERECTOMY;  Surgeon: Lenoard Aden, MD;  Location: [facility];  Service: Gynecology;  Laterality: Right;   skull decompression surgery 2005     @ [facility]    Family History  Problem Relation Age of Onset   Hypertension Mother    CAD Mother    Breast cancer Mother    Migraines Mother    Gout Mother    Heart disease Mother    Crohn's disease Mother    Hypertension Father    CAD Father    CVA Father    Heart disease Father    Colon polyps Father    Liver disease Father    Sjogren's syndrome Sister    Celiac disease Sister    Rheum arthritis Maternal Grandmother    Dementia Maternal Grandmother    Pancreatic cancer Maternal Grandfather    CAD Paternal Grandmother    Macular degeneration Paternal Grandmother    CVA Paternal Grandfather    Autism Son    ADD / ADHD Son    Breast cancer Maternal Aunt    Esophageal cancer Neg Hx    Colon cancer Neg Hx    Stomach cancer Neg Hx     Social History   Socioeconomic History   Marital status: Married    Spouse name: Not on file   Number of children: 1   Years of education: Assoc   Highest education level: Not on  file  Occupational History   Occupation: nurse  Tobacco Use   Smoking status: Former    Packs/day: 1.00    Years: 10.00    Additional pack years: 0.00    Total pack years: 10.00    Types: Cigarettes    Quit date: 04/04/2004    Years since quitting: 18.4    Passive exposure: Past   Smokeless tobacco: Never  Vaping Use   Vaping Use: Never used  Substance and Sexual Activity   Alcohol use: Not Currently   Drug use: Never   Sexual activity: Yes    Birth control/protection: Surgical  Other Topics Concern   Not on file  Social History Narrative   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   Married   1 son- born 2004 has autism   Enjoys exercise, special olympics with son   Drinks  4-5 cups of coffee a day       Right handed   Social Determinants of Health   Financial Resource Strain: Not on file  Food Insecurity: No Food Insecurity (06/08/2022)   Hunger Vital Sign    Worried About Running Out of Food in the Last Year: Never true    Ran Out of Food in the Last Year: Never true  Transportation Needs: No Transportation Needs (06/08/2022)   PRAPARE - Administrator, Civil Service (Medical): No    Lack of Transportation (Non-Medical): No  Physical Activity: Not on file  Stress: Not on file  Social Connections: Not on file  Intimate Partner Violence: Not At Risk (06/08/2022)   Humiliation, Afraid, Rape, and Kick questionnaire    Fear of Current or Ex-Partner: No    Emotionally Abused: No    Physically Abused: No    Sexually Abused: No    Outpatient Medications Prior to Visit  Medication Sig Dispense Refill   acetaminophen (TYLENOL) 500 MG tablet Take 1,000 mg by mouth every 6 (six) hours as needed for moderate pain.     apixaban (ELIQUIS) 5 MG TABS tablet Take 1 tablet (5 mg total) by mouth 2 (two) times daily. 60 tablet 2   atorvastatin (LIPITOR) 20 MG tablet Take 1 tablet (20 mg total) by mouth daily. 90 tablet 1   levothyroxine (SYNTHROID) 50 MCG tablet Take 1 tablet (50 mcg  total) by mouth daily. 90 tablet 1   liothyronine (CYTOMEL) 5 MCG tablet Take 10 mcg by mouth in the morning.     Melatonin 10 MG TABS Take 10 mg by mouth at bedtime as needed (sleep).     methocarbamol (ROBAXIN) 500 MG tablet Take 1 tablet (500 mg total) by mouth every 6 (six) hours as needed for muscle spasms. 30 tablet 0   Multiple Vitamin (MULTIVITAMIN WITH MINERALS) TABS tablet Take 1 tablet by mouth daily.     ondansetron (ZOFRAN-ODT) 4 MG disintegrating tablet Take 1 tablet (4 mg total) by mouth every 8 (eight) hours as needed. 20 tablet 0   oxyCODONE-acetaminophen (PERCOCET/ROXICET) 5-325 MG tablet Take 1 tablet by mouth every 8 (eight) hours as needed for severe pain or moderate pain. 15 tablet 0   pantoprazole (PROTONIX) 40 MG tablet Take 40 mg by mouth daily as needed (acid reflux).     promethazine (PHENERGAN) 25 MG tablet Take 1 tablet (25 mg total) by mouth every 8 (eight) hours as needed for nausea or vomiting. 30 tablet 1   sertraline (ZOLOFT) 50 MG tablet TAKE 1 TABLET BY MOUTH DAILY 90 tablet 1   SUMAtriptan (IMITREX) 100 MG tablet Take 1 tablet (100 mg total) by mouth at onset of migraine. May repeat in 2 hours if headache persists or recurs. Do not take more than 2 doses in 24 hours. (Patient taking differently: Take 100 mg by mouth as needed (at onset of migraine- may repeat once in 2 hours, if no relief- max of 2 doses/24 hours).) 10 tablet 2   traZODone (DESYREL) 100 MG tablet TAKE TWO TABLETS BY MOUTH EVERY NIGHT AT BEDTIME 180 tablet 1   sucralfate (CARAFATE) 1 GM/10ML suspension Take 10 mLs (1 g total) by mouth 3 (three) times daily with meals. (Patient not taking: Reported on 09/14/2022) 420 mL 0   No facility-administered medications prior to visit.    Allergies  Allergen Reactions   Naltrexone Nausea Only   Rosuvastatin Nausea Only   Tape Itching and Other (See Comments)  Skin itches if tape is left on for awhile   Tramadol Nausea And Vomiting   Effexor  [Venlafaxine] Nausea Only and Rash   Norco [Hydrocodone-Acetaminophen] Rash   Voltaren [Diclofenac] Rash    Review of Systems  Constitutional:  Negative for fever and malaise/fatigue.  HENT:  Negative for congestion.   Eyes:  Negative for blurred vision.  Respiratory:  Negative for cough and shortness of breath.   Cardiovascular:  Negative for chest pain, palpitations and leg swelling.  Gastrointestinal:  Positive for abdominal pain and nausea. Negative for melena and vomiting.  Musculoskeletal:  Negative for back pain.  Skin:  Negative for rash.  Neurological:  Negative for loss of consciousness and headaches.       Objective:    Physical Exam Vitals and nursing note reviewed.  Constitutional:      General: She is not in acute distress.    Appearance: She is well-developed. She is not ill-appearing or toxic-appearing.  Cardiovascular:     Rate and Rhythm: Normal rate and regular rhythm. No extrasystoles are present.    Pulses: Normal pulses.     Heart sounds: Normal heart sounds, S1 normal and S2 normal. No murmur heard.    No friction rub. No gallop.  Pulmonary:     Effort: Pulmonary effort is normal.     Breath sounds: Normal breath sounds.  Abdominal:     Tenderness: There is abdominal tenderness in the epigastric area and left upper quadrant.     Comments: Resolution of RLQ tenderness  Skin:    General: Skin is warm and dry.  Neurological:     Mental Status: She is alert.     GCS: GCS eye subscore is 4. GCS verbal subscore is 5. GCS motor subscore is 6.  Psychiatric:        Speech: Speech normal.        Behavior: Behavior normal. Behavior is cooperative.     BP (!) 94/53 (BP Location: Right Arm, Patient Position: Sitting, Cuff Size: Large)   Pulse 98   Temp 98.6 F (37 C) (Oral)   Resp 16   Wt 196 lb (88.9 kg)   SpO2 100%   BMI 28.94 kg/m  Wt Readings from Last 3 Encounters:  09/14/22 196 lb (88.9 kg)  09/08/22 195 lb (88.5 kg)  09/07/22 199 lb (90.3  kg)       Assessment & Plan:  Acute pancreatitis, unspecified complication status, unspecified pancreatitis type Assessment & Plan: Still having some epigastric tenderness with nausea.  Continue supportive measures including zofran or phenergan prn nausea.  GI team has been notified of her situation and they have her on a cancellation list.   Orders: -     CBC with Differential/Platelet -     Lipase -     Comprehensive metabolic panel  Gastroesophageal reflux disease, unspecified whether esophagitis present Assessment & Plan:  In regards to the episode of hematemesis- could have been a Mallory Weiss tear (on Eliquis). Alternatively she could have an ulcrer or gastritis.  She is already on a PPI- continue same.  She was unable to afford the carafate suspension- it looks like tablets are on her formulary. Will send rx for tablets.    Other orders -     Sucralfate; Take 1 tablet (1 g total) by mouth 4 (four) times daily -  with meals and at bedtime.  Dispense: 120 tablet; Refill: 0    I,Alexander Ruley,acting as a scribe for [employer], NP.,have  documented all relevant documentation on the behalf of Lemont Fillers, NP,as directed by  Lemont Fillers, NP while in the presence of Lemont Fillers, NP.

## 2022-09-14 NOTE — Assessment & Plan Note (Addendum)
Still having some epigastric tenderness with nausea.  Continue supportive measures including zofran or phenergan prn nausea.  GI team has been notified of her situation and they have her on a cancellation list.

## 2022-09-14 NOTE — Telephone Encounter (Signed)
Spoke with pt. Documented in other phone note Pt verbalized understanding with all questions answered.   

## 2022-09-14 NOTE — Telephone Encounter (Signed)
Spoke with pt on phone. Pt stated that she has an appointment at [facility] today and requesting records to be faxed over. Pt stated that as of now she will hold off on scheduling an office visit but just wants her records faxed over.  Records were faxed over as requested. [phone].  Pt verbalized understanding with all questions answered.

## 2022-09-14 NOTE — Assessment & Plan Note (Signed)
  In regards to the episode of hematemesis- could have been a Mallory Weiss tear (on Eliquis). Alternatively she could have an ulcrer or gastritis.  She is already on a PPI- continue same.  She was unable to afford the carafate suspension- it looks like tablets are on her formulary. Will send rx for tablets.

## 2022-09-15 ENCOUNTER — Encounter: Payer: Self-pay | Admitting: Family

## 2022-09-15 ENCOUNTER — Encounter: Payer: Self-pay | Admitting: Hematology & Oncology

## 2022-09-15 ENCOUNTER — Ambulatory Visit (HOSPITAL_BASED_OUTPATIENT_CLINIC_OR_DEPARTMENT_OTHER)
Admission: RE | Admit: 2022-09-15 | Discharge: 2022-09-15 | Disposition: A | Discharge status: Home / Self Care | Payer: BC Managed Care – PPO | Source: Ambulatory Visit | Attending: Hematology & Oncology | Admitting: Hematology & Oncology

## 2022-09-15 ENCOUNTER — Inpatient Hospital Stay: Payer: BC Managed Care – PPO | Attending: Hematology & Oncology | Admitting: Hematology & Oncology

## 2022-09-15 ENCOUNTER — Inpatient Hospital Stay: Payer: BC Managed Care – PPO

## 2022-09-15 ENCOUNTER — Ambulatory Visit: Payer: BC Managed Care – PPO | Admitting: Physical Therapy

## 2022-09-15 VITALS — BP 104/72 | HR 96 | Temp 98.1°F | Resp 20 | Ht 69.0 in | Wt 195.4 lb

## 2022-09-15 DIAGNOSIS — I2782 Chronic pulmonary embolism: Secondary | ICD-10-CM

## 2022-09-15 DIAGNOSIS — Z86711 Personal history of pulmonary embolism: Secondary | ICD-10-CM | POA: Diagnosis not present

## 2022-09-15 DIAGNOSIS — Z79899 Other long term (current) drug therapy: Secondary | ICD-10-CM | POA: Diagnosis not present

## 2022-09-15 DIAGNOSIS — M320 Drug-induced systemic lupus erythematosus: Secondary | ICD-10-CM | POA: Diagnosis not present

## 2022-09-15 DIAGNOSIS — R109 Unspecified abdominal pain: Secondary | ICD-10-CM | POA: Diagnosis not present

## 2022-09-15 DIAGNOSIS — K8500 Idiopathic acute pancreatitis without necrosis or infection: Secondary | ICD-10-CM

## 2022-09-15 DIAGNOSIS — Z7901 Long term (current) use of anticoagulants: Secondary | ICD-10-CM | POA: Insufficient documentation

## 2022-09-15 LAB — CBC WITH DIFFERENTIAL (CANCER CENTER ONLY)
Abs Immature Granulocytes: 0.02 10*3/uL (ref 0.00–0.07)
Basophils Absolute: 0 10*3/uL (ref 0.0–0.1)
Basophils Relative: 1 %
Eosinophils Absolute: 0.1 10*3/uL (ref 0.0–0.5)
Eosinophils Relative: 3 %
HCT: 40.7 % (ref 36.0–46.0)
Hemoglobin: 13.3 g/dL (ref 12.0–15.0)
Immature Granulocytes: 0 %
Lymphocytes Relative: 42 %
Lymphs Abs: 1.9 10*3/uL (ref 0.7–4.0)
MCH: 29 pg (ref 26.0–34.0)
MCHC: 32.7 g/dL (ref 30.0–36.0)
MCV: 88.7 fL (ref 80.0–100.0)
Monocytes Absolute: 0.3 10*3/uL (ref 0.1–1.0)
Monocytes Relative: 7 %
Neutro Abs: 2.1 10*3/uL (ref 1.7–7.7)
Neutrophils Relative %: 47 %
Platelet Count: 232 10*3/uL (ref 150–400)
RBC: 4.59 MIL/uL (ref 3.87–5.11)
RDW: 13.4 % (ref 11.5–15.5)
WBC Count: 4.5 10*3/uL (ref 4.0–10.5)
nRBC: 0 % (ref 0.0–0.2)

## 2022-09-15 LAB — CMP (CANCER CENTER ONLY)
ALT: 20 U/L (ref 0–44)
AST: 15 U/L (ref 15–41)
Albumin: 4.8 g/dL (ref 3.5–5.0)
Alkaline Phosphatase: 57 U/L (ref 38–126)
Anion gap: 11 (ref 5–15)
BUN: 10 mg/dL (ref 6–20)
CO2: 26 mmol/L (ref 22–32)
Calcium: 10.2 mg/dL (ref 8.9–10.3)
Chloride: 105 mmol/L (ref 98–111)
Creatinine: 0.8 mg/dL (ref 0.44–1.00)
GFR, Estimated: >60 mL/min (ref >60)
Glucose, Bld: 81 mg/dL (ref 70–99)
Potassium: 4.3 mmol/L (ref 3.5–5.1)
Sodium: 142 mmol/L (ref 135–145)
Total Bilirubin: 0.4 mg/dL (ref 0.3–1.2)
Total Protein: 6.7 g/dL (ref 6.5–8.1)

## 2022-09-15 LAB — D-DIMER, QUANTITATIVE: D-Dimer, Quant: <0.27 ug/mL-FEU (ref 0.00–0.50)

## 2022-09-15 LAB — TSH: TSH: 0.67 u[IU]/mL (ref 0.350–4.500)

## 2022-09-15 LAB — T4, FREE: Free T4: 0.82 ng/dL (ref 0.61–1.12)

## 2022-09-15 LAB — LACTATE DEHYDROGENASE: LDH: 127 U/L (ref 98–192)

## 2022-09-15 LAB — SAVE SMEAR(SSMR), FOR PROVIDER SLIDE REVIEW

## 2022-09-15 MED ORDER — IOHEXOL 300 MG/ML  SOLN
100.0000 mL | Freq: Once | INTRAMUSCULAR | Status: AC | PRN
  Administered 2022-09-15: 100 mL via INTRAVENOUS

## 2022-09-15 NOTE — Telephone Encounter (Signed)
Per Dr Myna Hidalgo: bring her in today for an evaluation. Scheduled an apt at 12 for a lab apt and 1215 with Dr E.

## 2022-09-15 NOTE — Progress Notes (Signed)
Hematology and Oncology Follow Up Visit  Kim Pham [id number] [birth year] 42 y.o. 09/15/2022   Principle Diagnosis:  Acute pulmonary embolism-heterozygous for Prothrombin II gene mutation --05/11/2022  Current Therapy:   Eliquis 5 mg p.o. twice daily     Interim History:  Kim Pham is back for an early visit.  She apparently has been having some problems.  She recently was having some abdominal pain.  It was felt that she may have had pancreatitis based on an elevated lipase level.  She has lost some weight.  She does not have much of an appetite.  She does have lupus.  She did have some oral ulcers.  These seem to be a little bit better.  She is on Eliquis for the pulmonary emboli.  She does have the prothrombin II gene mutation.  She has had some fatigue.  Again she is lost some weight.  Last time that we saw her she weighed 207 pounds.  She now is down 195 pounds.  She does see Gastroenterology tomorrow.  There has been no issues with fever.  She has had no leg swelling.  She has had no obvious change in bowel or bladder habits.  She does have occasional diarrhea.  She has had no headache.  Currently, I would have said that her performance status is probably ECOG 1.   Medications:  Current Outpatient Medications:    acetaminophen (TYLENOL) 500 MG tablet, Take 1,000 mg by mouth every 6 (six) hours as needed for moderate pain., Disp: , Rfl:    apixaban (ELIQUIS) 5 MG TABS tablet, Take 1 tablet (5 mg total) by mouth 2 (two) times daily., Disp: 60 tablet, Rfl: 2   atorvastatin (LIPITOR) 20 MG tablet, Take 1 tablet (20 mg total) by mouth daily., Disp: 90 tablet, Rfl: 1   famotidine (PEPCID) 20 MG tablet, Take 20 mg by mouth at bedtime., Disp: , Rfl:    levothyroxine (SYNTHROID) 50 MCG tablet, Take 1 tablet (50 mcg total) by mouth daily., Disp: 90 tablet, Rfl: 1   liothyronine (CYTOMEL) 5 MCG tablet, Take 10 mcg by mouth in the morning., Disp: , Rfl:    Melatonin 10 MG TABS,  Take 10 mg by mouth at bedtime as needed (sleep)., Disp: , Rfl:    Multiple Vitamin (MULTIVITAMIN WITH MINERALS) TABS tablet, Take 1 tablet by mouth daily., Disp: , Rfl:    ondansetron (ZOFRAN-ODT) 4 MG disintegrating tablet, Take 1 tablet (4 mg total) by mouth every 8 (eight) hours as needed., Disp: 20 tablet, Rfl: 0   pantoprazole (PROTONIX) 40 MG tablet, Take 40 mg by mouth daily as needed (acid reflux)., Disp: , Rfl:    promethazine (PHENERGAN) 25 MG tablet, Take 1 tablet (25 mg total) by mouth every 8 (eight) hours as needed for nausea or vomiting., Disp: 30 tablet, Rfl: 1   sertraline (ZOLOFT) 50 MG tablet, TAKE 1 TABLET BY MOUTH DAILY, Disp: 90 tablet, Rfl: 1   traZODone (DESYREL) 100 MG tablet, TAKE TWO TABLETS BY MOUTH EVERY NIGHT AT BEDTIME, Disp: 180 tablet, Rfl: 1   methocarbamol (ROBAXIN) 500 MG tablet, Take 1 tablet (500 mg total) by mouth every 6 (six) hours as needed for muscle spasms. (Patient not taking: Reported on 09/15/2022), Disp: 30 tablet, Rfl: 0   oxyCODONE-acetaminophen (PERCOCET/ROXICET) 5-325 MG tablet, Take 1 tablet by mouth every 8 (eight) hours as needed for severe pain or moderate pain. (Patient not taking: Reported on 09/15/2022), Disp: 15 tablet, Rfl: 0   sucralfate (CARAFATE) 1  g tablet, Take 1 tablet (1 g total) by mouth 4 (four) times daily -  with meals and at bedtime. (Patient not taking: Reported on 09/15/2022), Disp: 120 tablet, Rfl: 0   SUMAtriptan (IMITREX) 100 MG tablet, Take 1 tablet (100 mg total) by mouth at onset of migraine. May repeat in 2 hours if headache persists or recurs. Do not take more than 2 doses in 24 hours. (Patient not taking: Reported on 09/15/2022), Disp: 10 tablet, Rfl: 2  Allergies:  Allergies  Allergen Reactions   Naltrexone Nausea Only   Rosuvastatin Nausea Only   Tape Itching and Other (See Comments)    Skin itches if tape is left on for awhile   Tramadol Nausea And Vomiting   Effexor [Venlafaxine] Nausea Only and Rash   Norco  [Hydrocodone-Acetaminophen] Rash   Voltaren [Diclofenac] Rash    Past Medical History, Surgical history, Social history, and Family History were reviewed and updated.  Review of Systems: Review of Systems  Constitutional: Negative.   HENT:  Negative.    Eyes: Negative.   Respiratory: Negative.    Cardiovascular: Negative.   Gastrointestinal:  Positive for diarrhea.  Endocrine: Negative.   Genitourinary: Negative.    Musculoskeletal: Negative.   Skin: Negative.   Neurological: Negative.   Hematological: Negative.   Psychiatric/Behavioral: Negative.      Physical Exam:  height is 5\' 9"  (1.753 m) and weight is 195 lb 6.4 oz (88.6 kg). Her oral temperature is 98.1 F (36.7 C). Her blood pressure is 104/72 and her pulse is 96. Her respiration is 20 and oxygen saturation is 100%.   Wt Readings from Last 3 Encounters:  09/15/22 195 lb 6.4 oz (88.6 kg)  09/14/22 196 lb (88.9 kg)  09/08/22 195 lb (88.5 kg)    Physical Exam Vitals reviewed.  HENT:     Head: Normocephalic and atraumatic.  Eyes:     Pupils: Pupils are equal, round, and reactive to light.  Cardiovascular:     Rate and Rhythm: Normal rate and regular rhythm.     Heart sounds: Normal heart sounds.  Pulmonary:     Effort: Pulmonary effort is normal.     Breath sounds: Normal breath sounds.  Abdominal:     General: Bowel sounds are normal.     Palpations: Abdomen is soft.     Comments: Abdominal exam shows well-healed laparoscopy scar.  She has no fluid wave.  There is no guarding or rebound tenderness.  I really cannot hear many bowel sounds.  There is no palpable liver or spleen tip.  Musculoskeletal:        General: No tenderness or deformity. Normal range of motion.     Cervical back: Normal range of motion.     Comments: She has no leg swelling bilaterally.  She has a negative Homans' sign bilaterally  Lymphadenopathy:     Cervical: No cervical adenopathy.  Skin:    General: Skin is warm and dry.      Findings: No erythema or rash.  Neurological:     Mental Status: She is alert and oriented to person, place, and time.  Psychiatric:        Behavior: Behavior normal.        Thought Content: Thought content normal.        Judgment: Judgment normal.      Lab Results  Component Value Date   WBC 4.5 09/15/2022   HGB 13.3 09/15/2022   HCT 40.7 09/15/2022   MCV 88.7 09/15/2022  PLT 232 09/15/2022     Chemistry      Component Value Date/Time   NA 142 09/15/2022 1207   NA 143 01/01/2016 0841   K 4.3 09/15/2022 1207   CL 105 09/15/2022 1207   CO2 26 09/15/2022 1207   BUN 10 09/15/2022 1207   BUN 12 01/01/2016 0841   CREATININE 0.80 09/15/2022 1207   CREATININE 0.70 07/22/2017 1542      Component Value Date/Time   CALCIUM 10.2 09/15/2022 1207   ALKPHOS 57 09/15/2022 1207   AST 15 09/15/2022 1207   ALT 20 09/15/2022 1207   BILITOT 0.4 09/15/2022 1207       Impression and Plan: Kim Pham is a very charming 42 year old white female.  She has prothrombin 2 gene mutation.  She is heterozygous for this.  She had a pulmonary embolism back in January.  I am not sure exactly what the real problem is.  Again she had this elevated lipase.  She did have a ultrasound that was done of the right upper quadrant.  This is done on 09/07/2022.  This really was unremarkable.  She may have had a fatty liver.  They really cannot look at her pancreas.  I really think she needs to have a CT of the abdomen to look at her pancreas.  I am not sure why she would have decreased to absent bowel sounds.  Again when I listen to her, I really cannot hear any bowel sounds.  Again she will see Gastroenterology tomorrow.  I do not know if this has anything related to lupus.  Otherwise, we will just plan to keep our appointments as scheduled.    Josph Macho, MD 5/29/20241:37 PM

## 2022-09-16 ENCOUNTER — Ambulatory Visit: Payer: BC Managed Care – PPO | Admitting: Behavioral Health

## 2022-09-16 DIAGNOSIS — K920 Hematemesis: Secondary | ICD-10-CM | POA: Diagnosis not present

## 2022-09-16 DIAGNOSIS — R634 Abnormal weight loss: Secondary | ICD-10-CM | POA: Diagnosis not present

## 2022-09-16 DIAGNOSIS — R1013 Epigastric pain: Secondary | ICD-10-CM | POA: Diagnosis not present

## 2022-09-16 DIAGNOSIS — I2699 Other pulmonary embolism without acute cor pulmonale: Secondary | ICD-10-CM | POA: Diagnosis not present

## 2022-09-17 ENCOUNTER — Encounter: Payer: Self-pay | Admitting: *Deleted

## 2022-09-17 MED ORDER — PROMETHAZINE HCL 25 MG PO TABS: 25.0000 mg | ORAL_TABLET | Freq: Three times a day (TID) | ORAL | 1 refills | Status: DC | PRN

## 2022-09-17 NOTE — Addendum Note (Signed)
Addended by: Sandford Craze on: 09/17/2022 08:12 AM   Modules accepted: Orders

## 2022-09-19 ENCOUNTER — Other Ambulatory Visit: Payer: Self-pay | Admitting: Hematology & Oncology

## 2022-09-19 DIAGNOSIS — I2609 Other pulmonary embolism with acute cor pulmonale: Secondary | ICD-10-CM

## 2022-09-19 MED ORDER — APIXABAN 5 MG PO TABS
5.0000 mg | ORAL_TABLET | Freq: Two times a day (BID) | ORAL | 2 refills | Status: DC
Start: 2022-09-19 — End: 2022-12-21
  Filled 2022-09-19: qty 60, 30d supply, fill #0
  Filled 2022-10-23: qty 60, 30d supply, fill #1
  Filled 2022-11-23: qty 60, 30d supply, fill #2

## 2022-09-20 ENCOUNTER — Other Ambulatory Visit (HOSPITAL_COMMUNITY): Payer: Self-pay

## 2022-09-20 ENCOUNTER — Ambulatory Visit: Payer: BC Managed Care – PPO | Admitting: Physical Medicine and Rehabilitation

## 2022-09-20 ENCOUNTER — Encounter: Payer: Self-pay | Admitting: Physical Medicine and Rehabilitation

## 2022-09-21 ENCOUNTER — Telehealth: Payer: Self-pay

## 2022-09-21 ENCOUNTER — Ambulatory Visit: Payer: BC Managed Care – PPO | Admitting: Family

## 2022-09-21 NOTE — Telephone Encounter (Signed)
Spoke with patient and rescheduled OV for 09/23/22.

## 2022-09-23 ENCOUNTER — Other Ambulatory Visit (HOSPITAL_COMMUNITY): Payer: Self-pay | Admitting: General Surgery

## 2022-09-23 ENCOUNTER — Ambulatory Visit (INDEPENDENT_AMBULATORY_CARE_PROVIDER_SITE_OTHER): Payer: BC Managed Care – PPO | Admitting: Physical Medicine and Rehabilitation

## 2022-09-23 ENCOUNTER — Encounter: Payer: Self-pay | Admitting: Physical Medicine and Rehabilitation

## 2022-09-23 DIAGNOSIS — M5116 Intervertebral disc disorders with radiculopathy, lumbar region: Secondary | ICD-10-CM

## 2022-09-23 DIAGNOSIS — M47816 Spondylosis without myelopathy or radiculopathy, lumbar region: Secondary | ICD-10-CM | POA: Diagnosis not present

## 2022-09-23 DIAGNOSIS — M5416 Radiculopathy, lumbar region: Secondary | ICD-10-CM | POA: Diagnosis not present

## 2022-09-23 DIAGNOSIS — R1011 Right upper quadrant pain: Secondary | ICD-10-CM

## 2022-09-23 DIAGNOSIS — K811 Chronic cholecystitis: Secondary | ICD-10-CM

## 2022-09-23 NOTE — Progress Notes (Signed)
Kim Pham - 42 y.o. female MRN [id number]  Date of birth: [birth year]  Office Visit Note: Visit Date: 09/23/2022 PCP: Sandford Craze, NP Referred by: Sandford Craze, NP  Subjective: Chief Complaint  Patient presents with   Lower Back - Pain   HPI: Kim Pham is a 42 y.o. female who comes in today for evaluation of chronic, worsening and severe bilateral lower back pain radiating down to right anterior thigh and lateral aspect of calf. Pain started several weeks ago after mechanical fall. Pain worsens with prolonged standing, prolonged sitting and laying down. She describes pain as deep aching sensation, reports right leg feels very weak and tight, currently rates pain as 7 out of 10. Some relief of pain with home exercise regimen, rest and use of medications. Some relief of pain with Tylenol and topical pain creams. Currently undergoing formal physical therapy at [facility] some relief of pain with these treatments. Recent lumbar MRI imaging exhibits right foraminal to extraforaminal disc protrusion at L4-L5 contacting the exiting right L4 nerve root. There is also lower lumbar facet arthropathy, more prominent at L3-L4 and L4-L5. No high grade spinal canal stenosis noted. Patient recently underwent right L4 transforaminal epidural steroid injection in our office on 08/30/2022. She reports greater than 50% relief of pain with injection for about 2 weeks. States she felt some better but could not tell significant difference in pain relief. Patient would like to proceed with surgical consult at this time. She continues to work as Designer, jewellery for [employer].   Patients course is complicated by [facility] mutation, history of pulmonary embolism-currently taking Eliquis. Of note, she does carry multiple allergies/intolerances to medications.        Review of Systems  Musculoskeletal:  Positive for back pain.  Neurological:  Positive for weakness.       Reports weakness to  entire right lower extremity.   All other systems reviewed and are negative.  Otherwise per HPI.  Assessment & Plan: Visit Diagnoses:    ICD-10-CM   1. Lumbar radiculopathy  M54.16 Ambulatory referral to Orthopedic Surgery    2. Intervertebral disc disorders with radiculopathy, lumbar region  M51.16 Ambulatory referral to Orthopedic Surgery    3. Facet arthropathy, lumbar  M47.816 Ambulatory referral to Orthopedic Surgery       Plan: Findings:  Chronic, worsening and severe bilateral lower back pain radiating down to right anterior thigh and lateral aspect of calf.  Patient continues to have severe pain despite good conservative therapy such as formal physical therapy, home exercise regimen, rest and use of medications.  Patient's clinical presentation and exam are consistent with L5 nerve pattern.  4 out of 5 strength noted with right ankle dorsiflexion on exam today.  Some relief of pain with recent right L4 transforaminal epidural steroid injection, however severe pain returned shortly after procedure.  I discussed treatment plan with her in detail today, would recommend right L5 transforaminal epidural steroid injection.  Patient wishes to forego injection at this time and is requesting surgical consultation.  I will place referral to our spine surgeon Dr. Willia Craze to discuss treatment options.  I instructed her to continue with physical therapy as tolerated.  If she changes her mind and would like to proceed with injection we can get her scheduled quickly.     Meds & Orders: No orders of the defined types were placed in this encounter.   Orders Placed This Encounter  Procedures   Ambulatory referral to Orthopedic Surgery  Follow-up: Return if symptoms worsen or fail to improve.   Procedures: No procedures performed      Clinical History: EXAM: MRI LUMBAR SPINE WITHOUT CONTRAST   TECHNIQUE: Multiplanar, multisequence MR imaging of the lumbar spine was performed. No  intravenous contrast was administered.   COMPARISON:  Radiograph from 08/09/2022.   FINDINGS: Segmentation: Standard. Lowest well-formed disc space labeled the L5-S1 level.   Alignment: Trace levoscoliosis. Alignment otherwise normal with preservation of the normal lumbar lordosis. No significant listhesis.   Vertebrae: Vertebral body height maintained without acute or chronic fracture. Bone marrow signal intensity within normal limits. No discrete or worrisome osseous lesions. No abnormal marrow edema.   Conus medullaris and cauda equina: Conus extends to the L2-3 level. Conus and cauda equina appear normal.   Paraspinal and other soft tissues: Unremarkable.   Disc levels:   L1-2: Mild disc bulge. Mild bilateral facet hypertrophy. No spinal stenosis. Foramina remain patent.   L2-3: Mild annular disc bulge. Mild right greater than left facet hypertrophy. No spinal stenosis. Foramina remain patent.   L3-4: Mild diffuse disc bulge. Superimposed left foraminal to extraforaminal disc protrusion contacts the exiting left L3 nerve root. Mild facet and ligament flavum hypertrophy. Resultant mild spinal stenosis. Foramina remain adequately patent.   L4-5: Disc desiccation with mild disc bulge. Right foraminal to extraforaminal disc protrusion with annular fissure contacts the exiting right L4 nerve root (series 8, image 30). Mild to moderate bilateral facet hypertrophy. Resultant mild narrowing of the right lateral recess. Central canal remains patent. Mild bilateral L4 foraminal stenosis.   L5-S1: Disc desiccation with minimal annular disc bulge. No canal or lateral recess stenosis. Foramina remain patent.   IMPRESSION: 1. Left foraminal to extraforaminal disc protrusion at L3-4, contacting the exiting left L3 nerve root. 2. Right foraminal to extraforaminal disc protrusion at L4-5, contacting the exiting right L4 nerve root. 3. Lower lumbar facet hypertrophy, most  pronounced at L3-4 and L4-5. Finding could contribute to lower back pain.     Electronically Signed   By: Rise Mu M.D.   On: 08/10/2022 21:39   She reports that she quit smoking about 18 years ago. Her smoking use included cigarettes. She has a 10.00 pack-year smoking history. She has been exposed to tobacco smoke. She has never used smokeless tobacco. No results for input(s): "HGBA1C", "LABURIC" in the last 8760 hours.  Objective:  VS:  HT:    WT:   BMI:     BP:   HR: bpm  TEMP: ( )  RESP:  Physical Exam Vitals and nursing note reviewed.  HENT:     Head: Normocephalic and atraumatic.     Right Ear: External ear normal.     Left Ear: External ear normal.     Nose: Nose normal.     Mouth/Throat:     Mouth: Mucous membranes are moist.  Eyes:     Extraocular Movements: Extraocular movements intact.  Cardiovascular:     Rate and Rhythm: Normal rate.     Pulses: Normal pulses.  Pulmonary:     Effort: Pulmonary effort is normal.  Abdominal:     General: Abdomen is flat. There is no distension.  Musculoskeletal:        General: Tenderness present.     Cervical back: Normal range of motion.     Comments: Patient rises from seated position to standing without difficulty. Good lumbar range of motion. No pain noted with facet loading. 4 out of 5 strength noted to  right ankle dorsiflexion. No clonus noted bilaterally. No pain upon palpation of greater trochanters. No pain with internal/external rotation of bilateral hips. Sensation intact bilaterally. Negative slump test bilaterally. Ambulates without aid, gait steady.     Skin:    General: Skin is warm and dry.     Capillary Refill: Capillary refill takes less than 2 seconds.  Neurological:     General: No focal deficit present.     Mental Status: She is alert and oriented to person, place, and time.  Psychiatric:        Mood and Affect: Mood normal.        Behavior: Behavior normal.     Ortho Exam  Imaging: No  results found.  Past Medical/Family/Surgical/Social History: Medications & Allergies reviewed per EMR, new medications updated. 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 (HCC) 05/27/2022   On continuous oral anticoagulation 05/17/2022   Pulmonary embolism (HCC) 05/11/2022   RUQ pain 05/07/2022   Voice hoarseness 05/07/2022   Thyroid nodule 05/04/2022   Former smoker 04/20/2022   Subclinical hypothyroidism 12/18/2021   Effusion, left knee 12/16/2021   Endometriosis 12/16/2021   Cardiac murmur 12/16/2021   Obesity (BMI 30.0-34.9) 12/16/2021   Coronary artery calcification seen on CT scan 12/16/2021   Preventative health care 09/08/2021   Hyperlipidemia 09/08/2021   Amenorrhea 08/14/2021   Anxiety 08/14/2021   Chronic constipation 08/14/2021   Chronic headaches HISTORY OF MIGRAINES 08/14/2021   Complication of anesthesia 08/14/2021   HISTORY OF MIGRAINES 08/14/2021   OSTEOARTHRITIS 08/14/2021   Wears glasses for reading 08/14/2021   Chondromalacia, patella 06/30/2021   Family history of BRCA gene mutation 06/17/2021   Cyst of left ovary 06/17/2021   Vasomotor symptoms due to menopause 06/17/2021   Rectal bleeding 06/11/2021   Hot flashes 06/11/2021   Insomnia 08/19/2020   Gastroesophageal reflux disease 08/19/2020   Sacroiliac joint dysfunction of right side 12/19/2019   Degenerative disc disease, lumbar 09/04/2019   Psychogenic nonepileptic seizure 12/22/2016   Sicca syndrome (HCC) 11/22/2016   Raynaud's disease without gangrene 11/22/2016   History of Chiari malformation 11/22/2016   High risk medication use 11/22/2016   Other epilepsy, intractable, without status epilepticus (HCC) 11/01/2016   Arthralgia 08/18/2016   SLE  (systemic lupus erythematosus) (HCC) 03/26/2016   Depression with anxiety 03/26/2016   Transient alteration of awareness 01/01/2016   Chiari malformation 12/19/2015   Family history of breast cancer 09/09/2015   Past Medical History:  Diagnosis Date   Acute pulmonary embolism without acute cor pulmonale (HCC) 05/17/2022   Adnexal mass 02/24/2021   in epic seen on [place]   Amenorrhea 08/14/2021   Anxiety 08/14/2021   Arthralgia 08/18/2016   Back pain 09/04/2021   Blood in stool 09/08/2021   Cardiac murmur 12/16/2021   Chest discomfort 04/20/2022   Cholecystitis 05/26/2022   Chondromalacia, patella 06/30/2021   Chronic calculous cholecystitis 06/08/2022   Chronic constipation 08/14/2021   Chronic headaches HISTORY OF MIGRAINES 08/14/2021   Chronic migraine without aura, with intractable migraine, so stated, with status migrainosus 07/27/2017   Complication of anesthesia 08/14/2021   PONV   Constipation 06/11/2021   Coronary artery calcification seen on CT scan 12/16/2021   Cyst of left ovary 06/17/2021   Degenerative disc disease, lumbar 09/04/2019  Depression with anxiety 03/26/2016   Diarrhea 07/08/2017   DOE (dyspnea on exertion) 04/20/2022   Effusion, left knee 12/16/2021   Endometriosis    Family history of BRCA gene mutation 06/17/2021   [facility] genetic testing done 06/2021 - negative for breast cancer genes.   Family history of breast cancer 09/09/2015   Formatting of this note might be different from the original.  Mom dx'd age 66  Mat aunt dx'd age 8  Neither relative tested for BRCA  Formatting of this note might be different from the original.  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 (HCC)    High risk medication use 11/22/2016   history of Chiari malformation    History  of Chiari malformation 11/22/2016   history of COVID 02/2021   LOW GRADE FEVER CONGESTION AND HEADACHE ALL SYMPTOMS RESOLVED   Hot flashes 06/11/2021   Hyperlipidemia    Insomnia 08/19/2020   Low blood pressure    pt states history of low blood pressure-fainted 12/2010-instructed to move slowly   Nipple discharge 05/19/2022   Nonepileptic episode (HCC) 12/22/2016   Numbness and tingling 02/25/2016   Obesity (BMI 30.0-34.9) 12/16/2021   On continuous oral anticoagulation 05/17/2022   OSTEOARTHRITIS 08/14/2021   Other epilepsy, intractable, without status epilepticus (HCC) 11/01/2016   Pain in right knee 11/13/2020   Pain in unspecified joint 08/18/2016   Palpitations 12/15/2021   Pelvic pain in female 08/18/2021   Photosensitivity 11/22/2016   PONV (postoperative nausea and vomiting)    Preventative health care 09/08/2021   Psychogenic nonepileptic seizure 07/27/2017   Pulmonary embolism (HCC)    Raynaud's disease without gangrene 11/22/2016   Rectal bleeding 06/11/2021   RUQ pain 05/07/2022   Sacroiliac joint dysfunction of right side 12/19/2019   Sicca syndrome (HCC) 11/22/2016   SLE (systemic lupus erythematosus) (HCC) 03/26/2016   Status post laparoscopic cholecystectomy 06/08/2022   Subclinical hypothyroidism    Thyroid nodule    Transient alteration of awareness 01/01/2016   Vasomotor symptoms due to menopause 06/17/2021   Voice hoarseness 05/07/2022   Wears glasses for reading 08/14/2021   Family History  Problem Relation Age of Onset   Hypertension Mother    CAD Mother    Breast cancer Mother    Migraines Mother    Gout Mother    Heart disease Mother    Crohn's disease Mother    Hypertension Father    CAD Father    CVA Father    Heart disease Father    Colon polyps Father    Liver disease Father    Sjogren's syndrome Sister    Celiac disease Sister    Rheum arthritis Maternal Grandmother    Dementia Maternal Grandmother    Pancreatic cancer Maternal  Grandfather    CAD Paternal Grandmother    Macular degeneration Paternal Grandmother    CVA Paternal Grandfather    Autism Son    ADD / ADHD Son    Breast cancer Maternal Aunt    Esophageal cancer Neg Hx    Colon cancer Neg Hx    Stomach cancer Neg Hx    Past Surgical History:  Procedure Laterality Date   ABDOMINAL HYSTERECTOMY  2007   CHOLECYSTECTOMY N/A 06/08/2022   Procedure: LAPAROSCOPIC CHOLECYSTECTOMY WITH ICG;  Surgeon: Almond Lint, MD;  Location: [facility];  Service: General;  Laterality: N/A;   CYSTOSCOPY  2007   WITH HYSTERECTOMY   DIAGNOSTIC LAPAROSCOPY  2005   OOPHORECTOMY Left 08/18/2021   ROTATOR CUFF REPAIR Right 2010   SALPINGOOPHORECTOMY  04/16/2011   Procedure: SALPINGO OOPHERECTOMY;  Surgeon: Lenoard Aden, MD;  Location: [facility];  Service: Gynecology;  Laterality: Right;   skull decompression surgery 2005     @ [facility]   Social History   Occupational History   Occupation: nurse  Tobacco Use   Smoking status: Former    Packs/day: 1.00    Years: 10.00    Additional pack years: 0.00    Total pack years: 10.00    Types: Cigarettes    Quit date: 04/04/2004    Years since quitting: 18.4    Passive exposure: Past   Smokeless tobacco: Never  Vaping Use   Vaping Use: Never used  Substance and Sexual Activity   Alcohol use: Not Currently   Drug use: Never   Sexual activity: Yes    Birth control/protection: Surgical

## 2022-09-23 NOTE — Progress Notes (Signed)
Functional Pain Scale - descriptive words and definitions  Distracting (5)    Aware of pain/able to complete some ADL's but limited by pain/sleep is affected and active distractions are only slightly useful. Moderate range order  Average Pain 7  Lower back pain on right side that radiates into the right leg to the toes

## 2022-09-24 DIAGNOSIS — Z1231 Encounter for screening mammogram for malignant neoplasm of breast: Secondary | ICD-10-CM

## 2022-09-28 ENCOUNTER — Encounter: Payer: Self-pay | Admitting: Behavioral Health

## 2022-09-28 ENCOUNTER — Ambulatory Visit (INDEPENDENT_AMBULATORY_CARE_PROVIDER_SITE_OTHER): Payer: BC Managed Care – PPO | Admitting: Behavioral Health

## 2022-09-28 DIAGNOSIS — F411 Generalized anxiety disorder: Secondary | ICD-10-CM

## 2022-09-28 DIAGNOSIS — F331 Major depressive disorder, recurrent, moderate: Secondary | ICD-10-CM | POA: Diagnosis not present
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
## 2022-09-30 ENCOUNTER — Other Ambulatory Visit (HOSPITAL_COMMUNITY): Payer: Self-pay | Admitting: General Surgery

## 2022-09-30 ENCOUNTER — Ambulatory Visit (INDEPENDENT_AMBULATORY_CARE_PROVIDER_SITE_OTHER): Payer: BC Managed Care – PPO | Admitting: Orthopedic Surgery

## 2022-09-30 ENCOUNTER — Other Ambulatory Visit (INDEPENDENT_AMBULATORY_CARE_PROVIDER_SITE_OTHER): Payer: BC Managed Care – PPO

## 2022-09-30 ENCOUNTER — Ambulatory Visit (HOSPITAL_COMMUNITY)
Admission: RE | Admit: 2022-09-30 | Discharge: 2022-09-30 | Disposition: A | Discharge status: Home / Self Care | Payer: BC Managed Care – PPO | Source: Ambulatory Visit | Attending: General Surgery | Admitting: General Surgery

## 2022-09-30 ENCOUNTER — Encounter: Payer: Self-pay | Admitting: Orthopedic Surgery

## 2022-09-30 VITALS — BP 91/65 | HR 103 | Ht 69.0 in | Wt 194.0 lb

## 2022-09-30 DIAGNOSIS — K811 Chronic cholecystitis: Secondary | ICD-10-CM | POA: Insufficient documentation

## 2022-09-30 DIAGNOSIS — R1011 Right upper quadrant pain: Secondary | ICD-10-CM | POA: Insufficient documentation

## 2022-09-30 DIAGNOSIS — M5416 Radiculopathy, lumbar region: Secondary | ICD-10-CM | POA: Diagnosis not present

## 2022-09-30 MED ORDER — GADOBUTROL 1 MMOL/ML IV SOLN
9.0000 mL | Freq: Once | INTRAVENOUS | Status: AC | PRN
  Administered 2022-09-30: 9 mL via INTRAVENOUS

## 2022-09-30 NOTE — Progress Notes (Addendum)
Orthopedic Spine Surgery Office Note  Assessment: Patient is a 42 y.o. female with low back pain that radiates into her right buttock, thigh, and calf. Responded to a L4/5 injection   Plan: -Patient has tried PT, tylenol, lumbar steroid injection  -Discussed further conservative treatments (NSAIDs, repeat injection, medrok dosepak), but patient has had to call out of work and is affected on a daily basis in spite of conservative treatments, so she wanted to hear if there was a surgical option. I discussed right L4/5 far lateral discectomy as a treatment option. Patient elected to proceed.  -Patient will next be seen at the date of surgery   The patient has radiculopathy from her L4/5 far lateral disc herniation. I explained that most of the time, the symptoms related to herniated disc get better with conservative treatments. This patient has tried conservative treatment now for the last 2 months without any relief of her symptoms. Accordingly, discussed surgery in the form of far lateral discectomy as a treatment option.  The risks of the surgery including but not limited to recurrent disc herniation, persistent pain, dural tear, nerve root injury, infection, bleeding, fracture, DVT/PE, instability, need for additional procedures, blindness, heart attack, stroke, and death were discussed with the patient. The benefits of the surgery would be faster relief of the patient's radiating leg pain. I explained that back pain relief is not the goal of the surgery and it is not reliably alleviated with this surgery. The alternatives to surgical management were covered with the patient and included activity modification, physical therapy, over-the-counter pain medications, lyrica/gabapentin, medrol dosepak, and injections.  All the patient's questions were answered to her satisfaction. After this discussion, the patient expressed understanding and elected to proceed with surgical intervention.     ___________________________________________________________________________   History:  Patient is a 42 y.o. female who presents today for lumbar spine. Patient had a fall in her backyard in April of 2024. After that fll, she developed back pain that radiates into her right leg. It is felt in the buttock, the posterior thigh, the calf. She does not have any symptoms of pain in the left lower extremity. Pain is felt on a daily basis. Pain has gotten slightly better since onset, but is still severe. She has had to call out of work and cannot do simple tasks around her house as a result of the pain. She feels the pain at rest and with activity. She had a lumbar steroid injection that gave her about 50% relief but it only lasted for a week or two    Of note, patient has been on Percocet since at least January 2024  Weakness: yes, she feels her ankle is weaker on the right. No other weakness noted Symptoms of imbalance: denies Paresthesias and numbness: yes, in the right leg along the posterior thigh and calf. No other numbness or paresthesias.  Bowel or bladder incontinence: denies Saddle anesthesia: denies  Treatments tried: tylenol, PT, lumbar steroid injection  Review of systems: Denies fevers and chills, night sweats, unexplained weight loss, history of cancer. Has had pain that wakes her at night  Past medical history: History of DVT/PE Anxiety/Depression Constipation Migraines GERD HLD Endometriosis Insomnia Sicca syndrome Lupus  Allergies: naltrexone, rosuvastatin, adhesive tape, tramadol, Effexor, Norco, Voltaren  Past surgical history:  Cholecystectomy Hysterectomy Diagnostic laparoscopy Oophorectomy Right rotator cuff repair Basal skull decompression  Social history: Denies use of nicotine product (smoking, vaping, patches, smokeless) Alcohol use: denies Denies recreational drug use   Physical Exam:  BMI of 28.7  General: no acute distress, appears  stated age Neurologic: alert, answering questions appropriately, following commands Respiratory: unlabored breathing on room air, symmetric chest rise Psychiatric: appropriate affect, normal cadence to speech   MSK (spine):  -Strength exam      Left  Right EHL    5/5  4/5 TA    5/5  4/5 GSC    5/5  5/5 Knee extension  5/5  5/5 Hip flexion   5/5  5/5  -Sensory exam    Sensation intact to light touch in L3-S1 nerve distributions of bilateral lower extremities  -Achilles DTR: 2/4 on the left, 2/4 on the right -Patellar tendon DTR: 2/4 on the left, 2/4 on the right  -Straight leg raise: negative  -Contralateral straight leg raise: negative -Femoral nerve stretch test: negative bilaterally -Clonus: no beats bilaterally  -Left hip exam: no pain through range of motion, negative stinchfield, negative faber -Right hip exam: no pain through range of motion, negative stinchfield, negative faber  Imaging: XR of the lumbar spine from 08/09/2022 and 09/30/2022 was independently reviewed and interpreted, showing disc height loss at L5/S1. No other significant degenerative changes. No fracutre or dislocation seen. No evidence of instability seen on flexion/extension views.   MRI of the lumbar spine from 08/10/2022 was independently reviewed and interpreted, showing small left foraminal disc herniation at L3/4. There is a far lateral disc herniation on the right at L4/5. Lateral recess stenosis at L4/5. No other significant stenosis.    Patient name: Kim Pham Patient MRN: [id number] Date of visit: 09/30/22

## 2022-10-01 ENCOUNTER — Encounter: Payer: Self-pay | Admitting: Orthopedic Surgery

## 2022-10-01 DIAGNOSIS — R112 Nausea with vomiting, unspecified: Secondary | ICD-10-CM | POA: Diagnosis not present

## 2022-10-01 DIAGNOSIS — R1013 Epigastric pain: Secondary | ICD-10-CM | POA: Diagnosis not present

## 2022-10-01 DIAGNOSIS — F419 Anxiety disorder, unspecified: Secondary | ICD-10-CM | POA: Diagnosis not present

## 2022-10-04 ENCOUNTER — Encounter: Payer: Self-pay | Admitting: Hematology & Oncology

## 2022-10-04 MED ORDER — GABAPENTIN 300 MG PO CAPS: 300.0000 mg | ORAL_CAPSULE | Freq: Three times a day (TID) | ORAL | 0 refills | Status: DC

## 2022-10-05 ENCOUNTER — Encounter: Payer: Self-pay | Admitting: Orthopedic Surgery

## 2022-10-06 ENCOUNTER — Encounter: Payer: Self-pay | Admitting: Family

## 2022-10-06 ENCOUNTER — Inpatient Hospital Stay: Payer: BC Managed Care – PPO | Attending: Hematology & Oncology

## 2022-10-06 ENCOUNTER — Inpatient Hospital Stay (HOSPITAL_BASED_OUTPATIENT_CLINIC_OR_DEPARTMENT_OTHER): Payer: BC Managed Care – PPO | Admitting: Family

## 2022-10-06 VITALS — BP 96/73 | HR 96 | Temp 98.3°F | Resp 17 | Wt 188.1 lb

## 2022-10-06 DIAGNOSIS — I2782 Chronic pulmonary embolism: Secondary | ICD-10-CM | POA: Diagnosis not present

## 2022-10-06 DIAGNOSIS — K8500 Idiopathic acute pancreatitis without necrosis or infection: Secondary | ICD-10-CM

## 2022-10-06 DIAGNOSIS — I2609 Other pulmonary embolism with acute cor pulmonale: Secondary | ICD-10-CM

## 2022-10-06 DIAGNOSIS — D6852 Prothrombin gene mutation: Secondary | ICD-10-CM | POA: Diagnosis not present

## 2022-10-06 DIAGNOSIS — M320 Drug-induced systemic lupus erythematosus: Secondary | ICD-10-CM | POA: Diagnosis not present

## 2022-10-06 DIAGNOSIS — Z7901 Long term (current) use of anticoagulants: Secondary | ICD-10-CM | POA: Diagnosis not present

## 2022-10-06 DIAGNOSIS — Z86711 Personal history of pulmonary embolism: Secondary | ICD-10-CM | POA: Diagnosis not present

## 2022-10-06 LAB — CBC WITH DIFFERENTIAL (CANCER CENTER ONLY)
Abs Immature Granulocytes: 0.01 10*3/uL (ref 0.00–0.07)
Basophils Absolute: 0.1 10*3/uL (ref 0.0–0.1)
Basophils Relative: 1 %
Eosinophils Absolute: 0.1 10*3/uL (ref 0.0–0.5)
Eosinophils Relative: 2 %
HCT: 39 % (ref 36.0–46.0)
Hemoglobin: 12.5 g/dL (ref 12.0–15.0)
Immature Granulocytes: 0 %
Lymphocytes Relative: 25 %
Lymphs Abs: 1.4 10*3/uL (ref 0.7–4.0)
MCH: 28.5 pg (ref 26.0–34.0)
MCHC: 32.1 g/dL (ref 30.0–36.0)
MCV: 89 fL (ref 80.0–100.0)
Monocytes Absolute: 0.4 10*3/uL (ref 0.1–1.0)
Monocytes Relative: 8 %
Neutro Abs: 3.5 10*3/uL (ref 1.7–7.7)
Neutrophils Relative %: 64 %
Platelet Count: 218 10*3/uL (ref 150–400)
RBC: 4.38 MIL/uL (ref 3.87–5.11)
RDW: 14 % (ref 11.5–15.5)
WBC Count: 5.5 10*3/uL (ref 4.0–10.5)
nRBC: 0 % (ref 0.0–0.2)

## 2022-10-06 LAB — CMP (CANCER CENTER ONLY)
ALT: 15 U/L (ref 0–44)
AST: 14 U/L — ABNORMAL LOW (ref 15–41)
Albumin: 4.5 g/dL (ref 3.5–5.0)
Alkaline Phosphatase: 58 U/L (ref 38–126)
Anion gap: 10 (ref 5–15)
BUN: 7 mg/dL (ref 6–20)
CO2: 27 mmol/L (ref 22–32)
Calcium: 9.9 mg/dL (ref 8.9–10.3)
Chloride: 105 mmol/L (ref 98–111)
Creatinine: 0.73 mg/dL (ref 0.44–1.00)
GFR, Estimated: >60 mL/min (ref >60)
Glucose, Bld: 94 mg/dL (ref 70–99)
Potassium: 3.9 mmol/L (ref 3.5–5.1)
Sodium: 142 mmol/L (ref 135–145)
Total Bilirubin: 0.4 mg/dL (ref 0.3–1.2)
Total Protein: 6.6 g/dL (ref 6.5–8.1)

## 2022-10-06 LAB — LACTATE DEHYDROGENASE: LDH: 151 U/L (ref 98–192)

## 2022-10-06 NOTE — Progress Notes (Signed)
Hematology and Oncology Follow Up Visit  Kim Pham [id number] 1980/12/20 42 y.o. 10/06/2022   Principle Diagnosis:  Acute pulmonary embolism diagnosed on 05/11/2022 - resolved on repeat CTA 06/23/2022 Heterozygous for Prothrombin II gene mutation    Current Therapy:        Eliquis 5 mg PO BID   Interim History:  [name] is here today for follow-up. She is having persistent lower back pain with sciatic in the right leg. She has had extensive work up with ortho and is now scheduled for L4-5 FAO lateral discectomy on 11/08/2022. She states that she was already instructed to stop her Eliquis 2 days prior to surgery and restart the day after.  She had one episodes of blood while vomiting and she did let GI know. They felt this was due to irritation in the esophagus. No other blood loss noted. No bruising or petechiae.  She is still having abdominal pain and nausea with vomiting. She states that her recent EGD showed gastroparesis. She started Reglan but states that this has not helped.  No fever, chills, cough, rash, dizziness, SOB, chest pain, palpitations, abdominal pani or changes in bowel or bladder habits at this time.  She sis have one episode recently while at work where she became dizzy, diaphoretic and fel like she may pass out. Thankfully she did not.  No falls reported.   Appetite is minimal but she is doing her best to stay well hydrated. Weight is 188 lbs.   ECOG Performance Status: 1 - Symptomatic but completely ambulatory  Medications:  Allergies as of 10/06/2022       Reactions   Naltrexone Nausea Only   Rosuvastatin Nausea Only   Tape Itching, Other (See Comments)   Skin itches if tape is left on for awhile   Tramadol Nausea And Vomiting   Effexor [venlafaxine] Nausea Only, Rash   Norco [hydrocodone-acetaminophen] Rash   Voltaren [diclofenac] Rash        Medication List        Accurate as of October 06, 2022  2:22 PM. If you have any questions, ask your  nurse or doctor.          acetaminophen 500 MG tablet Commonly known as: TYLENOL Take 1,000 mg by mouth every 6 (six) hours as needed for moderate pain.   atorvastatin 20 MG tablet Commonly known as: LIPITOR Take 1 tablet (20 mg total) by mouth daily.   Eliquis 5 MG Tabs tablet Generic drug: apixaban Take 1 tablet (5 mg total) by mouth 2 (two) times daily.   famotidine 20 MG tablet Commonly known as: PEPCID Take 20 mg by mouth at bedtime.   gabapentin 300 MG capsule Commonly known as: NEURONTIN Take 1 capsule (300 mg total) by mouth 3 (three) times daily.   levothyroxine 50 MCG tablet Commonly known as: SYNTHROID Take 1 tablet (50 mcg total) by mouth daily.   liothyronine 5 MCG tablet Commonly known as: CYTOMEL Take 10 mcg by mouth in the morning.   Melatonin 10 MG Tabs Take 10 mg by mouth at bedtime as needed (sleep).   methocarbamol 500 MG tablet Commonly known as: ROBAXIN Take 1 tablet (500 mg total) by mouth every 6 (six) hours as needed for muscle spasms.   multivitamin with minerals Tabs tablet Take 1 tablet by mouth daily.   ondansetron 4 MG disintegrating tablet Commonly known as: ZOFRAN-ODT Take 1 tablet (4 mg total) by mouth every 8 (eight) hours as needed.   oxyCODONE-acetaminophen 5-325  MG tablet Commonly known as: PERCOCET/ROXICET Take 1 tablet by mouth every 8 (eight) hours as needed for severe pain or moderate pain.   pantoprazole 40 MG tablet Commonly known as: PROTONIX Take 40 mg by mouth daily as needed (acid reflux).   promethazine 25 MG tablet Commonly known as: PHENERGAN Take 1 tablet (25 mg total) by mouth every 8 (eight) hours as needed for nausea or vomiting.   sertraline 50 MG tablet Commonly known as: ZOLOFT TAKE 1 TABLET BY MOUTH DAILY   sucralfate 1 g tablet Commonly known as: Carafate Take 1 tablet (1 g total) by mouth 4 (four) times daily -  with meals and at bedtime.   SUMAtriptan 100 MG tablet Commonly known as:  IMITREX Take 1 tablet (100 mg total) by mouth at onset of migraine. May repeat in 2 hours if headache persists or recurs. Do not take more than 2 doses in 24 hours.   traZODone 100 MG tablet Commonly known as: DESYREL TAKE TWO TABLETS BY MOUTH EVERY NIGHT AT BEDTIME        Allergies:  Allergies  Allergen Reactions   Naltrexone Nausea Only   Rosuvastatin Nausea Only   Tape Itching and Other (See Comments)    Skin itches if tape is left on for awhile   Tramadol Nausea And Vomiting   Effexor [Venlafaxine] Nausea Only and Rash   Norco [Hydrocodone-Acetaminophen] Rash   Voltaren [Diclofenac] Rash    Past Medical History, Surgical history, Social history, and Family History were reviewed and updated.  Review of Systems: All other 10 point review of systems is negative.   Physical Exam:  vitals were not taken for this visit.   Wt Readings from Last 3 Encounters:  09/30/22 194 lb (88 kg)  09/15/22 195 lb 6.4 oz (88.6 kg)  09/14/22 196 lb (88.9 kg)    Ocular: Sclerae unicteric, pupils equal, round and reactive to light Ear-nose-throat: Oropharynx clear, dentition fair Lymphatic: No cervical or supraclavicular adenopathy Lungs no rales or rhonchi, good excursion bilaterally Heart regular rate and rhythm, no murmur appreciated Abd soft, nontender, positive bowel sounds MSK no focal spinal tenderness, no joint edema Neuro: non-focal, well-oriented, appropriate affect Breasts: Deferred   Lab Results  Component Value Date   WBC 4.5 09/15/2022   HGB 13.3 09/15/2022   HCT 40.7 09/15/2022   MCV 88.7 09/15/2022   PLT 232 09/15/2022   Lab Results  Component Value Date   FERRITIN 25.5 11/21/2014   IRON 77 11/21/2014   IRONPCTSAT 19.2 (L) 11/21/2014   Lab Results  Component Value Date   RBC 4.59 09/15/2022   No results found for: "KPAFRELGTCHN", "LAMBDASER", "KAPLAMBRATIO" No results found for: "IGGSERUM", "IGA", "IGMSERUM" No results found for: "TOTALPROTELP",  "ALBUMINELP", "A1GS", "A2GS", "BETS", "BETA2SER", "GAMS", "MSPIKE", "SPEI"   Chemistry      Component Value Date/Time   NA 142 09/15/2022 1207   NA 143 01/01/2016 0841   K 4.3 09/15/2022 1207   CL 105 09/15/2022 1207   CO2 26 09/15/2022 1207   BUN 10 09/15/2022 1207   BUN 12 01/01/2016 0841   CREATININE 0.80 09/15/2022 1207   CREATININE 0.70 07/22/2017 1542      Component Value Date/Time   CALCIUM 10.2 09/15/2022 1207   ALKPHOS 57 09/15/2022 1207   AST 15 09/15/2022 1207   ALT 20 09/15/2022 1207   BILITOT 0.4 09/15/2022 1207       Impression and Plan: Ms. Mirabito is a pleasant 42 yo caucasian female heterozygous for the  prothrombin gene mutation with history of PE diagnosed in 04/2022.  She is doing well on Eliquis and will continue her same regimen.  She will hold 2 days prior to surgery and restart the day after.  Follow-up in 2 months.   Eileen Stanford, NP 6/19/20242:22 PM

## 2022-10-08 ENCOUNTER — Other Ambulatory Visit: Payer: Self-pay | Admitting: Family

## 2022-10-12 ENCOUNTER — Ambulatory Visit: Payer: BC Managed Care – PPO | Admitting: Behavioral Health

## 2022-10-12 ENCOUNTER — Telehealth: Payer: Self-pay | Admitting: Family

## 2022-10-12 ENCOUNTER — Ambulatory Visit (INDEPENDENT_AMBULATORY_CARE_PROVIDER_SITE_OTHER): Payer: BC Managed Care – PPO | Admitting: Family

## 2022-10-12 VITALS — BP 111/68 | HR 102 | Temp 98.0°F | Resp 20 | Wt 184.0 lb

## 2022-10-12 DIAGNOSIS — R197 Diarrhea, unspecified: Secondary | ICD-10-CM

## 2022-10-12 NOTE — Addendum Note (Signed)
Addended by: Thelma Barge D on: 10/12/2022 05:08 PM   Modules accepted: Orders

## 2022-10-12 NOTE — Telephone Encounter (Signed)
Can you please contact pt to schedule a pre-op visit with me?

## 2022-10-12 NOTE — Progress Notes (Signed)
Subjective:     Patient ID: Kim Pham, female    DOB: [birth year], 42 y.o.   MRN: [id number]  Chief Complaint  Patient presents with   Diarrhea    10 days    HPI  Discussed the use of AI scribe software for clinical note transcription with the patient, who gave verbal consent to proceed.  History of Present Illness    The patient, with a history of cholecystectomy, presents with ongoing nausea, diarrhea, and abdominal pain. They recently saw a GI specialist who performed an endoscopy and found a significant amount of food residue in their stomach, suggesting possible gastroparesis. The patient had been taking Ozempic, which was stopped due to side effects, and Reglan, which was discontinued due to restlessness. The patient also reports a significant weight loss of approximately 50 pounds, which they attribute to a lack of appetite due to nausea and possible malabsorption. They are scheduled for back surgery at the end of July.  Wt Readings from Last 3 Encounters:  10/12/22 184 lb (83.5 kg)  10/06/22 188 lb 1.3 oz (85.3 kg)  09/30/22 194 lb (88 kg)   Lab Results  Component Value Date   TSH 0.670 09/15/2022     Health Maintenance Due  Topic Date Due   COVID-19 Vaccine (5 - 2023-24 season) 12/18/2021    Past Medical History:  Diagnosis Date   Acute pulmonary embolism without acute cor pulmonale (HCC) 05/17/2022   Adnexal mass 02/24/2021   in epic seen on [place]   Amenorrhea 08/14/2021   Anxiety 08/14/2021   Arthralgia 08/18/2016   Back pain 09/04/2021   Blood in stool 09/08/2021   Cardiac murmur 12/16/2021   Chest discomfort 04/20/2022   Cholecystitis 05/26/2022   Chondromalacia, patella 06/30/2021   Chronic calculous cholecystitis 06/08/2022   Chronic constipation 08/14/2021   Chronic headaches HISTORY OF MIGRAINES 08/14/2021   Chronic migraine without aura, with intractable migraine, so stated, with status migrainosus 07/27/2017   Complication of anesthesia  08/14/2021   PONV   Constipation 06/11/2021   Coronary artery calcification seen on CT scan 12/16/2021   Cyst of left ovary 06/17/2021   Degenerative disc disease, lumbar 09/04/2019   Depression with anxiety 03/26/2016   Diarrhea 07/08/2017   DOE (dyspnea on exertion) 04/20/2022   Effusion, left knee 12/16/2021   Endometriosis    Family history of BRCA gene mutation 06/17/2021   [facility] genetic testing done 06/2021 - negative for breast cancer genes.   Family history of breast cancer 09/09/2015   Formatting of this note might be different from the original.  Mom dx'd age 49  Mat aunt dx'd age 49  Neither relative tested for BRCA  Formatting of this note might be different from the original.  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 (HCC)    High risk medication use 11/22/2016   history of Chiari malformation    History of Chiari malformation 11/22/2016   history of COVID 02/2021   LOW GRADE FEVER CONGESTION AND HEADACHE ALL SYMPTOMS RESOLVED   Hot flashes 06/11/2021   Hyperlipidemia    Insomnia 08/19/2020   Low blood pressure    pt states history of low blood pressure-fainted 12/2010-instructed to move slowly   Nipple discharge 05/19/2022   Nonepileptic episode (HCC) 12/22/2016  Numbness and tingling 02/25/2016   Obesity (BMI 30.0-34.9) 12/16/2021   On continuous oral anticoagulation 05/17/2022   OSTEOARTHRITIS 08/14/2021   Other epilepsy, intractable, without status epilepticus (HCC) 11/01/2016   Pain in right knee 11/13/2020   Pain in unspecified joint 08/18/2016   Palpitations 12/15/2021   Pelvic pain in female 08/18/2021   Photosensitivity 11/22/2016   PONV (postoperative nausea and vomiting)    Preventative health care 09/08/2021   Psychogenic nonepileptic seizure 07/27/2017    Pulmonary embolism (HCC)    Raynaud's disease without gangrene 11/22/2016   Rectal bleeding 06/11/2021   RUQ pain 05/07/2022   Sacroiliac joint dysfunction of right side 12/19/2019   Sicca syndrome (HCC) 11/22/2016   SLE (systemic lupus erythematosus) (HCC) 03/26/2016   Status post laparoscopic cholecystectomy 06/08/2022   Subclinical hypothyroidism    Thyroid nodule    Transient alteration of awareness 01/01/2016   Vasomotor symptoms due to menopause 06/17/2021   Voice hoarseness 05/07/2022   Wears glasses for reading 08/14/2021    Past Surgical History:  Procedure Laterality Date   ABDOMINAL HYSTERECTOMY  2007   CHOLECYSTECTOMY N/A 06/08/2022   Procedure: LAPAROSCOPIC CHOLECYSTECTOMY WITH ICG;  Surgeon: Almond Lint, MD;  Location: [facility];  Service: General;  Laterality: N/A;   CYSTOSCOPY  2007   WITH HYSTERECTOMY   DIAGNOSTIC LAPAROSCOPY  2005   OOPHORECTOMY Left 08/18/2021   ROTATOR CUFF REPAIR Right 2010   SALPINGOOPHORECTOMY  04/16/2011   Procedure: SALPINGO OOPHERECTOMY;  Surgeon: Lenoard Aden, MD;  Location: [facility];  Service: Gynecology;  Laterality: Right;   skull decompression surgery 2005     @ [facility]    Family History  Problem Relation Age of Onset   Hypertension Mother    CAD Mother    Breast cancer Mother    Migraines Mother    Gout Mother    Heart disease Mother    Crohn's disease Mother    Hypertension Father    CAD Father    CVA Father    Heart disease Father    Colon polyps Father    Liver disease Father    Sjogren's syndrome Sister    Celiac disease Sister    Rheum arthritis Maternal Grandmother    Dementia Maternal Grandmother    Pancreatic cancer Maternal Grandfather    CAD Paternal Grandmother    Macular degeneration Paternal Grandmother    CVA Paternal Grandfather    Autism Son    ADD / ADHD Son    Breast cancer Maternal Aunt    Esophageal cancer Neg Hx    Colon cancer Neg Hx    Stomach cancer Neg Hx     Social History    Socioeconomic History   Marital status: Married    Spouse name: Not on file   Number of children: 1   Years of education: Assoc   Highest education level: Not on file  Occupational History   Occupation: nurse  Tobacco Use   Smoking status: Former    Packs/day: 1.00    Years: 10.00    Additional pack years: 0.00    Total pack years: 10.00    Types: Cigarettes    Quit date: 04/04/2004    Years since quitting: 18.5    Passive exposure: Past   Smokeless tobacco: Never  Vaping Use   Vaping Use: Never used  Substance and Sexual Activity   Alcohol use: Not Currently   Drug use: Never   Sexual activity: Yes    Birth control/protection: Surgical  Other Topics Concern   Not on file  Social History Narrative   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   Married   1 son- born 2004 has autism   Enjoys exercise, special olympics with son   Drinks 4-5 cups of coffee a day       Right handed   Social Determinants of Health   Financial Resource Strain: Not on file  Food Insecurity: No Food Insecurity (06/08/2022)   Hunger Vital Sign    Worried About Running Out of Food in the Last Year: Never true    Ran Out of Food in the Last Year: Never true  Transportation Needs: No Transportation Needs (06/08/2022)   PRAPARE - Administrator, Civil Service (Medical): No    Lack of Transportation (Non-Medical): No  Physical Activity: Not on file  Stress: Not on file  Social Connections: Not on file  Intimate Partner Violence: Not At Risk (06/08/2022)   Humiliation, Afraid, Rape, and Kick questionnaire    Fear of Current or Ex-Partner: No    Emotionally Abused: No    Physically Abused: No    Sexually Abused: No    Outpatient Medications Prior to Visit  Medication Sig Dispense Refill   acetaminophen (TYLENOL) 500 MG tablet Take 1,000 mg by mouth every 6 (six) hours as needed for moderate pain.     apixaban (ELIQUIS) 5 MG TABS tablet Take 1 tablet (5 mg total) by mouth 2 (two) times daily.  60 tablet 2   atorvastatin (LIPITOR) 20 MG tablet Take 1 tablet (20 mg total) by mouth daily. 90 tablet 1   famotidine (PEPCID) 20 MG tablet Take 20 mg by mouth at bedtime.     gabapentin (NEURONTIN) 300 MG capsule Take 1 capsule (300 mg total) by mouth 3 (three) times daily. 90 capsule 0   levothyroxine (SYNTHROID) 50 MCG tablet Take 1 tablet (50 mcg total) by mouth daily. 90 tablet 1   liothyronine (CYTOMEL) 5 MCG tablet Take 10 mcg by mouth in the morning.     Melatonin 10 MG TABS Take 10 mg by mouth at bedtime as needed (sleep).     methocarbamol (ROBAXIN) 500 MG tablet Take 1 tablet (500 mg total) by mouth every 6 (six) hours as needed for muscle spasms. 30 tablet 0   Multiple Vitamin (MULTIVITAMIN WITH MINERALS) TABS tablet Take 1 tablet by mouth daily.     ondansetron (ZOFRAN-ODT) 4 MG disintegrating tablet Take 1 tablet (4 mg total) by mouth every 8 (eight) hours as needed. 20 tablet 0   oxyCODONE-acetaminophen (PERCOCET/ROXICET) 5-325 MG tablet Take 1 tablet by mouth every 8 (eight) hours as needed for severe pain or moderate pain. 15 tablet 0   pantoprazole (PROTONIX) 40 MG tablet Take 40 mg by mouth daily as needed (acid reflux).     promethazine (PHENERGAN) 25 MG tablet Take 1 tablet (25 mg total) by mouth every 8 (eight) hours as needed for nausea or vomiting. 30 tablet 1   sertraline (ZOLOFT) 50 MG tablet TAKE 1 TABLET BY MOUTH DAILY 90 tablet 1   sucralfate (CARAFATE) 1 g tablet Take 1 tablet (1 g total) by mouth 4 (four) times daily -  with meals and at bedtime. 120 tablet 0   SUMAtriptan (IMITREX) 100 MG tablet Take 1 tablet (100 mg total) by mouth at onset of migraine. May repeat in 2 hours if headache persists or recurs. Do not take more than 2 doses in 24 hours. 10 tablet 2  traZODone (DESYREL) 100 MG tablet TAKE TWO TABLETS BY MOUTH EVERY NIGHT AT BEDTIME 180 tablet 1   No facility-administered medications prior to visit.    Allergies  Allergen Reactions   Naltrexone  Nausea Only   Rosuvastatin Nausea Only   Tape Itching and Other (See Comments)    Skin itches if tape is left on for awhile   Tramadol Nausea And Vomiting   Effexor [Venlafaxine] Nausea Only and Rash   Norco [Hydrocodone-Acetaminophen] Rash   Voltaren [Diclofenac] Rash    ROS     Objective:    Physical Exam Constitutional:      Appearance: She is well-developed.  Cardiovascular:     Rate and Rhythm: Normal rate and regular rhythm.     Heart sounds: Normal heart sounds. No murmur heard. Pulmonary:     Effort: Pulmonary effort is normal. No respiratory distress.     Breath sounds: Normal breath sounds. No wheezing.  Abdominal:     General: Bowel sounds are normal.     Palpations: Abdomen is soft.     Comments: Mild epigastric and RUQ tenderness.   Psychiatric:        Behavior: Behavior normal.        Thought Content: Thought content normal.        Judgment: Judgment normal.      BP 111/68 (BP Location: Right Arm, Patient Position: Sitting, Cuff Size: Normal)   Pulse (!) 102   Temp 98 F (36.7 C) (Oral)   Resp 20   Wt 184 lb (83.5 kg)   SpO2 98%   BMI 27.17 kg/m  Wt Readings from Last 3 Encounters:  10/12/22 184 lb (83.5 kg)  10/06/22 188 lb 1.3 oz (85.3 kg)  09/30/22 194 lb (88 kg)       Assessment & Plan:   Problem List Items Addressed This Visit       Unprioritized   Diarrhea - Primary    Chronic abdominal pain since her cholecystectomy 2/24.  Work up thus far has included MRCP, CT abd/pelvis and EGD.  No obvious cause for her abdominal pain has been identified.    Pt is advised as follows:     Persistent nausea, diarrhea, and abdominal discomfort. Possible gastroparesis suggested by GI specialist. Previous use of Ozempic associated with adverse effects. Reglan discontinued due to restlessness. -Order stool studies to rule out infectious causes. -Follow up with GI specialist in mid-August for further evaluation. Sooner if symptoms don't improve.        Relevant Orders   Clostridium Difficile by PCR([facility])   GI Profile, Stool, PCR    I am having Roda Shutters. Holzworth maintain her SUMAtriptan, Melatonin, liothyronine, levothyroxine, pantoprazole, multivitamin with minerals, acetaminophen, atorvastatin, ondansetron, methocarbamol, sertraline, oxyCODONE-acetaminophen, sucralfate, famotidine, promethazine, apixaban, gabapentin, and traZODone.  No orders of the defined types were placed in this encounter.

## 2022-10-12 NOTE — Assessment & Plan Note (Signed)
Chronic abdominal pain since her cholecystectomy 2/24.  Work up thus far has included MRCP, CT abd/pelvis and EGD.  No obvious cause for her abdominal pain has been identified.    Pt is advised as follows:     Persistent nausea, diarrhea, and abdominal discomfort. Possible gastroparesis suggested by GI specialist. Previous use of Ozempic associated with adverse effects. Reglan discontinued due to restlessness. -Order stool studies to rule out infectious causes. -Follow up with GI specialist in mid-August for further evaluation. Sooner if symptoms don't improve.

## 2022-10-12 NOTE — Patient Instructions (Signed)
VISIT SUMMARY:  During your visit, we discussed your ongoing symptoms of nausea, diarrhea, and abdominal pain. We suspect these symptoms may be due to a condition called gastroparesis, which is a delay in the emptying of your stomach. We also discussed your significant weight loss, which may be related to your gastrointestinal issues. Lastly, we discussed your upcoming back surgery scheduled for the end of July.  YOUR PLAN:  -GASTROINTESTINAL DISTRESS: We suspect your symptoms may be due to gastroparesis, a condition where the stomach takes too long to empty its contents. We will order stool studies to rule out any infectious causes. You will also need to follow up with your GI specialist in mid-August for further evaluation.  -WEIGHT LOSS: Your significant weight loss may be due to poor absorption of nutrients because of your gastrointestinal issues as well as recent poor appetite/poor intake.  This will be addressed with your GI specialist during your follow-up visit.  -PREOPERATIVE EVALUATION: As you have a back surgery scheduled for the end of July, we will schedule a preoperative visit to ensure you are in the best possible health before your surgery.  INSTRUCTIONS:  Please ensure to schedule your follow-up visit with your GI specialist in mid-August. Also, remember to schedule your preoperative visit for your upcoming back surgery. If your symptoms worsen or you have any concerns, please contact our office immediately.

## 2022-10-13 ENCOUNTER — Other Ambulatory Visit: Payer: BC Managed Care – PPO

## 2022-10-13 DIAGNOSIS — R197 Diarrhea, unspecified: Secondary | ICD-10-CM

## 2022-10-14 LAB — GI PROFILE, STOOL, PCR

## 2022-10-14 LAB — CLOSTRIDIUM DIFFICILE BY PCR: Toxigenic C. Difficile by PCR: NEGATIVE

## 2022-10-18 ENCOUNTER — Encounter: Payer: Self-pay | Admitting: Family

## 2022-10-18 DIAGNOSIS — N6452 Nipple discharge: Secondary | ICD-10-CM

## 2022-10-19 ENCOUNTER — Telehealth: Payer: Self-pay

## 2022-10-19 NOTE — Telephone Encounter (Signed)
Confirmed with patient that she would like to keep upcoming appointment with Dr. Chales Abrahams, states she would like to see him for a second opinion. She did ask to reschedule to a later date d/t surgery. Rescheduled for 12/07/22 at 8:50 am.

## 2022-10-22 ENCOUNTER — Ambulatory Visit (INDEPENDENT_AMBULATORY_CARE_PROVIDER_SITE_OTHER): Payer: BC Managed Care – PPO | Admitting: Family

## 2022-10-22 ENCOUNTER — Encounter: Payer: Self-pay | Admitting: Orthopedic Surgery

## 2022-10-22 ENCOUNTER — Ambulatory Visit (HOSPITAL_BASED_OUTPATIENT_CLINIC_OR_DEPARTMENT_OTHER)
Admission: RE | Admit: 2022-10-22 | Discharge: 2022-10-22 | Disposition: A | Discharge status: Home / Self Care | Payer: BC Managed Care – PPO | Source: Ambulatory Visit | Attending: Family | Admitting: Family

## 2022-10-22 VITALS — BP 96/66 | HR 95 | Temp 97.9°F | Resp 18 | Ht 69.0 in | Wt 184.6 lb

## 2022-10-22 DIAGNOSIS — Z01818 Encounter for other preprocedural examination: Secondary | ICD-10-CM | POA: Insufficient documentation

## 2022-10-22 DIAGNOSIS — I2699 Other pulmonary embolism without acute cor pulmonale: Secondary | ICD-10-CM | POA: Diagnosis not present

## 2022-10-22 LAB — CBC WITH DIFFERENTIAL/PLATELET
Absolute Monocytes: 350 cells/uL (ref 200–950)
Basophils Absolute: 38 cells/uL (ref 0–200)
Eosinophils Absolute: 139 cells/uL (ref 15–500)
MCH: 28.9 pg (ref 27.0–33.0)
MCV: 89.3 fL (ref 80.0–100.0)
Neutro Abs: 2342 cells/uL (ref 1500–7800)
Neutrophils Relative %: 48.8 %
Platelets: 245 10*3/uL (ref 140–400)
RBC: 4.22 10*6/uL (ref 3.80–5.10)
WBC: 4.8 10*3/uL (ref 3.8–10.8)

## 2022-10-22 NOTE — Addendum Note (Signed)
Addended by: Sandford Craze on: 10/22/2022 02:23 PM   Modules accepted: Orders

## 2022-10-22 NOTE — Progress Notes (Signed)
Subjective:     Patient ID: Kim Pham, female    DOB: December 10, 1980, 42 y.o.   MRN: [id number]  Chief Complaint  Patient presents with   Follow-up    HPI  Discussed the use of AI scribe software for clinical note transcription with the patient, who gave verbal consent to proceed.  History of Present Illness   The patient, with a history of back pain, is scheduled for a L4, L5 lateral discectomy to be performed by Dr. Willia Craze.  She reports that her pain is improving, but she still experiences episodes of intense pain in her back and numbness in her right leg. One such episode occurred recently at a grocery store, causing her to fall. She is currently on gabapentin, which she takes twice daily due to excessive sleepiness with three doses.  She also has a history of thyroid issues, which were last evaluated in May with satisfactory results. She continues synthroid. She reports some nausea and diarrhea, but less abdominal pain than before. She denies any urinary concerns, frequent headaches, chest pain, shortness of breath, and leg swelling. She has no concerns about her hearing or vision, and no skin rashes or moles of concern. Her mood is stable.      Lab Results  Component Value Date   TSH 0.670 09/15/2022       Health Maintenance Due  Topic Date Due   COVID-19 Vaccine (5 - 2023-24 season) 12/18/2021    Past Medical History:  Diagnosis Date   Acute pulmonary embolism without acute cor pulmonale (HCC) 05/17/2022   Adnexal mass 02/24/2021   in epic seen on [place]   Amenorrhea 08/14/2021   Anxiety 08/14/2021   Arthralgia 08/18/2016   Back pain 09/04/2021   Blood in stool 09/08/2021   Cardiac murmur 12/16/2021   Chest discomfort 04/20/2022   Cholecystitis 05/26/2022   Chondromalacia, patella 06/30/2021   Chronic calculous cholecystitis 06/08/2022   Chronic constipation 08/14/2021   Chronic headaches HISTORY OF MIGRAINES 08/14/2021   Chronic migraine without aura,  with intractable migraine, so stated, with status migrainosus 07/27/2017   Complication of anesthesia 08/14/2021   PONV   Constipation 06/11/2021   Coronary artery calcification seen on CT scan 12/16/2021   Cyst of left ovary 06/17/2021   Degenerative disc disease, lumbar 09/04/2019   Depression with anxiety 03/26/2016   Diarrhea 07/08/2017   DOE (dyspnea on exertion) 04/20/2022   Effusion, left knee 12/16/2021   Endometriosis    Family history of BRCA gene mutation 06/17/2021   [facility] genetic testing done 06/2021 - negative for breast cancer genes.   Family history of breast cancer 09/09/2015   Formatting of this note might be different from the original.  Mom dx'd age 25  Mat aunt dx'd age 87  Neither relative tested for BRCA  Formatting of this note might be different from the original.  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 (HCC)    High risk medication use 11/22/2016   history of Chiari malformation    History of Chiari malformation 11/22/2016   history of COVID 02/2021   LOW GRADE FEVER CONGESTION AND HEADACHE ALL SYMPTOMS RESOLVED   Hot flashes 06/11/2021   Hyperlipidemia    Insomnia 08/19/2020   Low blood pressure    pt states  history of low blood pressure-fainted 12/2010-instructed to move slowly   Nipple discharge 05/19/2022   Nonepileptic episode (HCC) 12/22/2016   Numbness and tingling 02/25/2016   Obesity (BMI 30.0-34.9) 12/16/2021   On continuous oral anticoagulation 05/17/2022   OSTEOARTHRITIS 08/14/2021   Other epilepsy, intractable, without status epilepticus (HCC) 11/01/2016   Pain in right knee 11/13/2020   Pain in unspecified joint 08/18/2016   Palpitations 12/15/2021   Pelvic pain in female 08/18/2021   Photosensitivity 11/22/2016   PONV (postoperative nausea  and vomiting)    Preventative health care 09/08/2021   Psychogenic nonepileptic seizure 07/27/2017   Pulmonary embolism (HCC)    Raynaud's disease without gangrene 11/22/2016   Rectal bleeding 06/11/2021   RUQ pain 05/07/2022   Sacroiliac joint dysfunction of right side 12/19/2019   Sicca syndrome (HCC) 11/22/2016   SLE (systemic lupus erythematosus) (HCC) 03/26/2016   Status post laparoscopic cholecystectomy 06/08/2022   Subclinical hypothyroidism    Thyroid nodule    Transient alteration of awareness 01/01/2016   Vasomotor symptoms due to menopause 06/17/2021   Voice hoarseness 05/07/2022   Wears glasses for reading 08/14/2021    Past Surgical History:  Procedure Laterality Date   ABDOMINAL HYSTERECTOMY  2007   CHOLECYSTECTOMY N/A 06/08/2022   Procedure: LAPAROSCOPIC CHOLECYSTECTOMY WITH ICG;  Surgeon: Almond Lint, MD;  Location: [facility];  Service: General;  Laterality: N/A;   CYSTOSCOPY  2007   WITH HYSTERECTOMY   DIAGNOSTIC LAPAROSCOPY  2005   OOPHORECTOMY Left 08/18/2021   ROTATOR CUFF REPAIR Right 2010   SALPINGOOPHORECTOMY  04/16/2011   Procedure: SALPINGO OOPHERECTOMY;  Surgeon: Lenoard Aden, MD;  Location: [facility];  Service: Gynecology;  Laterality: Right;   skull decompression surgery 2005     @ [facility]    Family History  Problem Relation Age of Onset   Hypertension Mother    CAD Mother    Breast cancer Mother    Migraines Mother    Gout Mother    Heart disease Mother    Crohn's disease Mother    Hypertension Father    CAD Father    CVA Father    Heart disease Father    Colon polyps Father    Liver disease Father    Sjogren's syndrome Sister    Celiac disease Sister    Rheum arthritis Maternal Grandmother    Dementia Maternal Grandmother    Pancreatic cancer Maternal Grandfather    CAD Paternal Grandmother    Macular degeneration Paternal Grandmother    CVA Paternal Grandfather    Autism Son    ADD / ADHD Son    Breast cancer Maternal Aunt     Esophageal cancer Neg Hx    Colon cancer Neg Hx    Stomach cancer Neg Hx     Social History   Socioeconomic History   Marital status: Married    Spouse name: Not on file   Number of children: 1   Years of education: Assoc   Highest education level: Not on file  Occupational History   Occupation: nurse  Tobacco Use   Smoking status: Former    Packs/day: 1.00    Years: 10.00    Additional pack years: 0.00    Total pack years: 10.00    Types: Cigarettes    Quit date: 04/04/2004    Years since quitting: 18.5    Passive exposure: Past   Smokeless tobacco: Never  Vaping Use   Vaping Use: Never used  Substance and Sexual Activity  Alcohol use: Not Currently   Drug use: Never   Sexual activity: Yes    Birth control/protection: Surgical  Other Topics Concern   Not on file  Social History Narrative   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   Married   1 son- born 2004 has autism   Enjoys exercise, special olympics with son   Drinks 4-5 cups of coffee a day       Right handed   Social Determinants of Health   Financial Resource Strain: Not on file  Food Insecurity: No Food Insecurity (06/08/2022)   Hunger Vital Sign    Worried About Running Out of Food in the Last Year: Never true    Ran Out of Food in the Last Year: Never true  Transportation Needs: No Transportation Needs (06/08/2022)   PRAPARE - Administrator, Civil Service (Medical): No    Lack of Transportation (Non-Medical): No  Physical Activity: Not on file  Stress: Not on file  Social Connections: Not on file  Intimate Partner Violence: Not At Risk (06/08/2022)   Humiliation, Afraid, Rape, and Kick questionnaire    Fear of Current or Ex-Partner: No    Emotionally Abused: No    Physically Abused: No    Sexually Abused: No    Outpatient Medications Prior to Visit  Medication Sig Dispense Refill   acetaminophen (TYLENOL) 500 MG tablet Take 1,000 mg by mouth every 6 (six) hours as needed for moderate pain.      apixaban (ELIQUIS) 5 MG TABS tablet Take 1 tablet (5 mg total) by mouth 2 (two) times daily. 60 tablet 2   atorvastatin (LIPITOR) 20 MG tablet Take 1 tablet (20 mg total) by mouth daily. 90 tablet 1   famotidine (PEPCID) 20 MG tablet Take 20 mg by mouth at bedtime.     gabapentin (NEURONTIN) 300 MG capsule Take 1 capsule (300 mg total) by mouth 3 (three) times daily. 90 capsule 0   levothyroxine (SYNTHROID) 50 MCG tablet Take 1 tablet (50 mcg total) by mouth daily. 90 tablet 1   liothyronine (CYTOMEL) 5 MCG tablet Take 10 mcg by mouth in the morning.     Melatonin 10 MG TABS Take 10 mg by mouth at bedtime as needed (sleep).     methocarbamol (ROBAXIN) 500 MG tablet Take 1 tablet (500 mg total) by mouth every 6 (six) hours as needed for muscle spasms. 30 tablet 0   Multiple Vitamin (MULTIVITAMIN WITH MINERALS) TABS tablet Take 1 tablet by mouth daily.     ondansetron (ZOFRAN-ODT) 4 MG disintegrating tablet Take 1 tablet (4 mg total) by mouth every 8 (eight) hours as needed. 20 tablet 0   oxyCODONE-acetaminophen (PERCOCET/ROXICET) 5-325 MG tablet Take 1 tablet by mouth every 8 (eight) hours as needed for severe pain or moderate pain. 15 tablet 0   pantoprazole (PROTONIX) 40 MG tablet Take 40 mg by mouth daily as needed (acid reflux).     promethazine (PHENERGAN) 25 MG tablet Take 1 tablet (25 mg total) by mouth every 8 (eight) hours as needed for nausea or vomiting. 30 tablet 1   sertraline (ZOLOFT) 50 MG tablet TAKE 1 TABLET BY MOUTH DAILY 90 tablet 1   sucralfate (CARAFATE) 1 g tablet Take 1 tablet (1 g total) by mouth 4 (four) times daily -  with meals and at bedtime. 120 tablet 0   SUMAtriptan (IMITREX) 100 MG tablet Take 1 tablet (100 mg total) by mouth at onset of migraine. May repeat in  2 hours if headache persists or recurs. Do not take more than 2 doses in 24 hours. 10 tablet 2   traZODone (DESYREL) 100 MG tablet TAKE TWO TABLETS BY MOUTH EVERY NIGHT AT BEDTIME 180 tablet 1   No  facility-administered medications prior to visit.    Allergies  Allergen Reactions   Naltrexone Nausea Only   Rosuvastatin Nausea Only   Tape Itching and Other (See Comments)    Skin itches if tape is left on for awhile   Tramadol Nausea And Vomiting   Effexor [Venlafaxine] Nausea Only and Rash   Norco [Hydrocodone-Acetaminophen] Rash   Voltaren [Diclofenac] Rash    Review of Systems  Constitutional:  Negative for weight loss.  HENT:  Negative for congestion and hearing loss.   Eyes:  Negative for blurred vision.  Respiratory:  Negative for cough and shortness of breath.   Cardiovascular:  Negative for chest pain and leg swelling.  Gastrointestinal:  Positive for diarrhea (mild) and nausea (mild). Negative for abdominal pain.  Genitourinary:  Negative for dysuria and frequency.  Musculoskeletal:  Positive for back pain.  Skin:  Negative for rash.  Neurological:  Positive for sensory change (intermittent numbness right leg). Negative for headaches.  Psychiatric/Behavioral:         Mood is good       Objective:    Physical Exam Constitutional:      General: She is not in acute distress.    Appearance: Normal appearance. She is well-developed.  HENT:     Head: Normocephalic and atraumatic.     Right Ear: Tympanic membrane, ear canal and external ear normal.     Left Ear: Tympanic membrane, ear canal and external ear normal.     Mouth/Throat:     Pharynx: Oropharynx is clear. No pharyngeal swelling or oropharyngeal exudate.  Eyes:     General: No scleral icterus. Neck:     Thyroid: No thyromegaly.  Cardiovascular:     Rate and Rhythm: Normal rate and regular rhythm.     Heart sounds: Normal heart sounds. No murmur heard. Pulmonary:     Effort: Pulmonary effort is normal. No respiratory distress.     Breath sounds: Normal breath sounds. No wheezing.  Abdominal:     General: Bowel sounds are normal.     Palpations: Abdomen is soft.     Tenderness: There is no  abdominal tenderness.  Musculoskeletal:     Cervical back: Neck supple.  Skin:    General: Skin is warm and dry.  Neurological:     Mental Status: She is alert and oriented to person, place, and time.     Deep Tendon Reflexes:     Reflex Scores:      Patellar reflexes are 1+ on the right side and 1+ on the left side.    Comments: Bilateral UE/LE strength is 5/5  Psychiatric:        Mood and Affect: Mood normal.        Behavior: Behavior normal.        Thought Content: Thought content normal.        Judgment: Judgment normal.      BP 96/66   Pulse 95   Temp 97.9 F (36.6 C)   Resp 18   Ht 5\' 9"  (1.753 m)   Wt 184 lb 9.6 oz (83.7 kg)   SpO2 98%   BMI 27.26 kg/m  Wt Readings from Last 3 Encounters:  10/22/22 184 lb 9.6 oz (83.7 kg)  10/12/22 184 lb (83.5 kg)  10/06/22 188 lb 1.3 oz (85.3 kg)       Assessment & Plan:   Problem List Items Addressed This Visit       Unprioritized   Pulmonary embolism (HCC)    She is maintained on Eliquis and followed by Hematology. Their perioperative recommendations for her her Eliquis are as follows:  She will hold 2 days prior to surgery and restart the day after.       Pre-op evaluation - Primary    Obtain labs, CXR. EKG performed today and personally reviewed. Notes NSR. No acute changes and appears unchanged from EKG 09/01/22.  Clearance pending review of results.      Relevant Orders   DG Chest 2 View   Urine Culture   Protime-INR   PTT   CBC w/Diff   Comp Met (CMET)   EKG 12-Lead (Completed)    I am having Roda Shutters. Berthelot maintain her SUMAtriptan, Melatonin, liothyronine, levothyroxine, pantoprazole, multivitamin with minerals, acetaminophen, atorvastatin, ondansetron, methocarbamol, sertraline, oxyCODONE-acetaminophen, sucralfate, famotidine, promethazine, apixaban, gabapentin, and traZODone.  No orders of the defined types were placed in this encounter.

## 2022-10-22 NOTE — Assessment & Plan Note (Addendum)
Obtain labs, CXR. EKG performed today and personally reviewed. Notes NSR. No acute changes and appears unchanged from EKG 09/01/22.  Clearance pending review of results.

## 2022-10-22 NOTE — Assessment & Plan Note (Signed)
She is maintained on Eliquis and followed by Hematology. Their perioperative recommendations for her her Eliquis are as follows:  She will hold 2 days prior to surgery and restart the day after.

## 2022-10-23 LAB — CBC WITH DIFFERENTIAL/PLATELET
Basophils Relative: 0.8 %
Eosinophils Relative: 2.9 %
HCT: 37.7 % (ref 35.0–45.0)
Hemoglobin: 12.2 g/dL (ref 11.7–15.5)
Lymphs Abs: 1930 cells/uL (ref 850–3900)
MCHC: 32.4 g/dL (ref 32.0–36.0)
MPV: 11.2 fL (ref 7.5–12.5)
Monocytes Relative: 7.3 %
RDW: 13.5 % (ref 11.0–15.0)
Total Lymphocyte: 40.2 %

## 2022-10-23 LAB — URINE CULTURE
MICRO NUMBER:: 15164777
SPECIMEN QUALITY:: ADEQUATE

## 2022-10-23 LAB — COMPREHENSIVE METABOLIC PANEL
AG Ratio: 2.3 (calc) (ref 1.0–2.5)
ALT: 29 U/L (ref 6–29)
AST: 25 U/L (ref 10–30)
Albumin: 4.4 g/dL (ref 3.6–5.1)
Alkaline phosphatase (APISO): 70 U/L (ref 31–125)
BUN: 11 mg/dL (ref 7–25)
CO2: 26 mmol/L (ref 20–32)
Calcium: 10.1 mg/dL (ref 8.6–10.2)
Chloride: 106 mmol/L (ref 98–110)
Creat: 0.7 mg/dL (ref 0.50–0.99)
Globulin: 1.9 g/dL (calc) (ref 1.9–3.7)
Glucose, Bld: 78 mg/dL (ref 65–99)
Potassium: 4.4 mmol/L (ref 3.5–5.3)
Sodium: 141 mmol/L (ref 135–146)
Total Bilirubin: 0.3 mg/dL (ref 0.2–1.2)
Total Protein: 6.3 g/dL (ref 6.1–8.1)

## 2022-10-23 LAB — PROTIME-INR
INR: 1
Prothrombin Time: 11 s (ref 9.0–11.5)

## 2022-10-23 LAB — APTT: aPTT: 29 s (ref 23–32)

## 2022-10-25 MED ORDER — PREGABALIN 75 MG PO CAPS: 75.0000 mg | ORAL_CAPSULE | Freq: Two times a day (BID) | ORAL | 0 refills | Status: DC

## 2022-10-26 ENCOUNTER — Ambulatory Visit
Admission: RE | Admit: 2022-10-26 | Discharge: 2022-10-26 | Disposition: A | Discharge status: Home / Self Care | Payer: BC Managed Care – PPO | Source: Ambulatory Visit | Attending: Family | Admitting: Family

## 2022-10-26 ENCOUNTER — Ambulatory Visit: Payer: BC Managed Care – PPO | Admitting: Family

## 2022-10-26 DIAGNOSIS — N6452 Nipple discharge: Secondary | ICD-10-CM

## 2022-10-26 DIAGNOSIS — R928 Other abnormal and inconclusive findings on diagnostic imaging of breast: Secondary | ICD-10-CM | POA: Diagnosis not present

## 2022-10-27 NOTE — Progress Notes (Signed)
Surgical Instructions   Your procedure is scheduled on July 22. Report to [facility] Main Entrance "A" at 5:30 A.M., then check in with the Admitting office. Any questions or running late day of surgery: call [phone]  Questions prior to your surgery date: call [phone], Monday-Friday, 8am-4pm. If you experience any cold or flu symptoms such as cough, fever, chills, shortness of breath, etc. between now and your scheduled surgery, please notify us at the above number.    Remember:  Do not eat after midnight the night before your surgery  You may drink clear liquids until 4:30am the morning of your surgery.   Clear liquids allowed are: Water, Non-Citrus Juices (without pulp), Carbonated Beverages, Clear Tea, Black Coffee Only (NO MILK, CREAM OR POWDERED CREAMER of any kind), and Gatorade.  The night before surgery:  No food after midnight. ONLY clear liquids after midnight  The day of surgery (if you do NOT have diabetes):  Drink ONE (1) Pre-Surgery Clear Ensure by 4:30am the morning of surgery. Drink in one sitting. Do not sip.  This drink was given to you during your hospital  pre-op appointment visit.  Nothing else to drink after completing the  Pre-Surgery Clear Ensure.         If you have questions, please contact your surgeon's office.      Take these medicines the morning of surgery with A SIP OF WATER levothyroxine (SYNTHROID)  liothyronine (CYTOMEL)  pantoprazole (PROTONIX)  pregabalin (LYRICA)    May take these medicines IF NEEDED: acetaminophen (TYLENOL)  ondansetron (ZOFRAN-ODT)  promethazine (PHENERGAN)  SUMAtriptan (IMITREX)    Hold Eliquis 2 days prior to surgery. Last dose will be July 19.   One week prior to surgery, STOP taking any Aspirin (unless otherwise instructed by your surgeon) Aleve, Naproxen, Ibuprofen, Motrin, Advil, Goody's, BC's, all herbal medications, fish oil, and non-prescription vitamins.                     Do NOT Smoke  (Tobacco/Vaping) for 24 hours prior to your procedure.  If you use a CPAP at night, you may bring your mask/headgear for your overnight stay.   You will be asked to remove any contacts, glasses, piercing's, hearing aid's, dentures/partials prior to surgery. Please bring cases for these items if needed.    Patients discharged the day of surgery will not be allowed to drive home, and someone needs to stay with them for 24 hours.  SURGICAL WAITING ROOM VISITATION Patients may have no more than 2 support people in the waiting area - these visitors may rotate.   Pre-op nurse will coordinate an appropriate time for 1 ADULT support person, who may not rotate, to accompany patient in pre-op.  Children under the age of 19 must have an adult with them who is not the patient and must remain in the main waiting area with an adult.  If the patient needs to stay at the hospital during part of their recovery, the visitor guidelines for inpatient rooms apply.  Please refer to the [facility] website for the visitor guidelines for any additional information.   If you received a COVID test during your pre-op visit  it is requested that you wear a mask when out in public, stay away from anyone that may not be feeling well and notify your surgeon if you develop symptoms. If you have been in contact with anyone that has tested positive in the last 10 days please notify you surgeon.  Pre-operative 5 CHG Bath Instructions   You can play a key role in reducing the risk of infection after surgery. Your skin needs to be as free of germs as possible. You can reduce the number of germs on your skin by washing with CHG (chlorhexidine gluconate) soap before surgery. CHG is an antiseptic soap that kills germs and continues to kill germs even after washing.   DO NOT use if you have an allergy to chlorhexidine/CHG or antibacterial soaps. If your skin becomes reddened or irritated, stop using the CHG and notify one of  our RNs at [phone].   Please shower with the CHG soap starting 4 days before surgery using the following schedule:     Please keep in mind the following:  DO NOT shave, including legs and underarms, starting the day of your first shower.   You may shave your face at any point before/day of surgery.  Place clean sheets on your bed the day you start using CHG soap. Use a clean washcloth (not used since being washed) for each shower. DO NOT sleep with pets once you start using the CHG.   CHG Shower Instructions:  If you choose to wash your hair and private area, wash first with your normal shampoo/soap.  After you use shampoo/soap, rinse your hair and body thoroughly to remove shampoo/soap residue.  Turn the water OFF and apply about 3 tablespoons (45 ml) of CHG soap to a CLEAN washcloth.  Apply CHG soap ONLY FROM YOUR NECK DOWN TO YOUR TOES (washing for 3-5 minutes)  DO NOT use CHG soap on face, private areas, open wounds, or sores.  Pay special attention to the area where your surgery is being performed.  If you are having back surgery, having someone wash your back for you may be helpful. Wait 2 minutes after CHG soap is applied, then you may rinse off the CHG soap.  Pat dry with a clean towel  Put on clean clothes/pajamas   If you choose to wear lotion, please use ONLY the CHG-compatible lotions on the back of this paper.   Additional instructions for the day of surgery: DO NOT APPLY any lotions, deodorants, cologne, or perfumes.   Do not bring valuables to the hospital. [facility] is not responsible for any belongings/valuables. Do not wear nail polish, gel polish, artificial nails, or any other type of covering on natural nails (fingers and toes) Do not wear jewelry or makeup Put on clean/comfortable clothes.  Please brush your teeth.  Ask your nurse before applying any prescription medications to the skin.     CHG Compatible Lotions   Aveeno Moisturizing lotion   Cetaphil Moisturizing Cream  Cetaphil Moisturizing Lotion  Clairol Herbal Essence Moisturizing Lotion, Dry Skin  Clairol Herbal Essence Moisturizing Lotion, Extra Dry Skin  Clairol Herbal Essence Moisturizing Lotion, Normal Skin  Curel Age Defying Therapeutic Moisturizing Lotion with Alpha Hydroxy  Curel Extreme Care Body Lotion  Curel Soothing Hands Moisturizing Hand Lotion  Curel Therapeutic Moisturizing Cream, Fragrance-Free  Curel Therapeutic Moisturizing Lotion, Fragrance-Free  Curel Therapeutic Moisturizing Lotion, Original Formula  Eucerin Daily Replenishing Lotion  Eucerin Dry Skin Therapy Plus Alpha Hydroxy Crme  Eucerin Dry Skin Therapy Plus Alpha Hydroxy Lotion  Eucerin Original Crme  Eucerin Original Lotion  Eucerin Plus Crme Eucerin Plus Lotion  Eucerin TriLipid Replenishing Lotion  Keri Anti-Bacterial Hand Lotion  Keri Deep Conditioning Original Lotion Dry Skin Formula Softly Scented  Keri Deep Conditioning Original Lotion, Fragrance Free Sensitive Skin Formula  Keri Lotion Fast [employer] Fragrance Free Sensitive Skin Formula  Keri Lotion Fast Absorbing Softly Scented Dry Skin Formula  Keri Original Lotion  Keri Skin Renewal Lotion Keri Silky Smooth Lotion  Keri Silky Smooth Sensitive Skin Lotion  Nivea Body Creamy Conditioning Oil  Nivea Body Extra Enriched Lotion  Nivea Body Original Lotion  Nivea Body Sheer Moisturizing Lotion Nivea Crme  Nivea Skin Firming Lotion  NutraDerm 30 Skin Lotion  NutraDerm Skin Lotion  NutraDerm Therapeutic Skin Cream  NutraDerm Therapeutic Skin Lotion  ProShield Protective Hand Cream  Provon moisturizing lotion  Please read over the following fact sheets that you were given.

## 2022-10-28 ENCOUNTER — Encounter (HOSPITAL_COMMUNITY): Payer: Self-pay

## 2022-10-28 ENCOUNTER — Other Ambulatory Visit: Payer: Self-pay

## 2022-10-28 ENCOUNTER — Encounter (HOSPITAL_COMMUNITY)
Admission: RE | Admit: 2022-10-28 | Discharge: 2022-10-28 | Disposition: A | Discharge status: Home / Self Care | Payer: BC Managed Care – PPO | Source: Ambulatory Visit | Attending: Orthopedic Surgery | Admitting: Orthopedic Surgery

## 2022-10-28 DIAGNOSIS — Z01818 Encounter for other preprocedural examination: Secondary | ICD-10-CM

## 2022-10-28 DIAGNOSIS — M5126 Other intervertebral disc displacement, lumbar region: Secondary | ICD-10-CM | POA: Diagnosis not present

## 2022-10-28 DIAGNOSIS — Z01812 Encounter for preprocedural laboratory examination: Secondary | ICD-10-CM | POA: Diagnosis not present

## 2022-10-28 HISTORY — DX: Essential (primary) hypertension: I10

## 2022-10-28 HISTORY — DX: Attention-deficit hyperactivity disorder, unspecified type: F90.9

## 2022-10-28 LAB — SURGICAL PCR SCREEN
MRSA, PCR: NEGATIVE
Staphylococcus aureus: NEGATIVE

## 2022-10-28 NOTE — Progress Notes (Addendum)
PCP - Dr. Daryel Gerald  Cardiologist - no  EP-no  Endocrine-no  Pulm-no  Hemologist- Dr. Myna Hidalgo- follows for Hx of Heterozygous PE-04/2022  Chest x-ray - 10/22/22  EKG - 10/22/22  Stress Test - no  ECHO - 05/12/22  Cardiac Cath - no  AICD-no PM-no LOOP-no  Nerve Stimulator-no  Dialysis-no  Sleep Study - no CPAP - no  LABS-had CBC with diff, CMP, PR/INR, PTT, on 7/5/240-results in EPIC.  ASA-na  Eliquis- last day will be 09/05/22  ERAS-yes  HA1C-na GLP-1-no Fasting Blood Sugar - na Checks Blood Sugar ___na__ times a day  Anesthesia-  Pt denies having chest pain, sob, or fever at this time. All instructions explained to the pt, with a verbal understanding of the material. Pt agrees to go over the instructions while at home for a better understanding. The opportunity to ask questions was provided.

## 2022-11-07 NOTE — Anesthesia Preprocedure Evaluation (Addendum)
Anesthesia Evaluation  Patient identified by MRN, date of birth, ID band Patient awake    Reviewed: Allergy & Precautions, NPO status , Patient's Chart, lab work & pertinent test results  History of Anesthesia Complications (+) PONV and history of anesthetic complications  Airway Mallampati: II  TM Distance: >3 FB Neck ROM: Full    Dental no notable dental hx. (+) Dental Advisory Given, Teeth Intact   Pulmonary former smoker   Pulmonary exam normal breath sounds clear to auscultation       Cardiovascular hypertension, + CAD and + DOE  Normal cardiovascular exam+ Valvular Problems/Murmurs  Rhythm:Regular Rate:Normal  Echo 04/2022 1. Left ventricular ejection fraction, by estimation, is 60 to 65%. The left ventricle has normal function. The left ventricle has no regional wall motion abnormalities. Left ventricular diastolic parameters were normal.   2. Right ventricular systolic function is normal. The right ventricular size is normal.   3. The mitral valve is normal in structure. No evidence of mitral valve regurgitation. No evidence of mitral stenosis.   4. The aortic valve is tricuspid. Aortic valve regurgitation is not visualized. No aortic stenosis is present.   5. The inferior vena cava is normal in size with greater than 50% respiratory variability, suggesting right atrial pressure of 3 mmH    Neuro/Psych  Headaches, Seizures -, Well Controlled,  PSYCHIATRIC DISORDERS Anxiety Depression       GI/Hepatic Neg liver ROS,GERD  Medicated,,Cholelithiasis- symptomatic   Endo/Other  Hypothyroidism  Obesity   Renal/GU negative Renal ROS     Musculoskeletal  (+) Arthritis , Osteoarthritis,    Abdominal  (+) + obese  Peds  Hematology negative hematology ROS (+)   Anesthesia Other Findings   Reproductive/Obstetrics                             Anesthesia Physical Anesthesia Plan  ASA:  2  Anesthesia Plan: General   Post-op Pain Management: Tylenol PO (pre-op)* and Ketamine IV*   Induction: Cricoid pressure planned and Intravenous  PONV Risk Score and Plan: 4 or greater and Treatment may vary due to age or medical condition, Scopolamine patch - Pre-op, Ondansetron, Dexamethasone, Midazolam, Propofol infusion and TIVA  Airway Management Planned: Oral ETT  Additional Equipment: ClearSight  Intra-op Plan:   Post-operative Plan: Extubation in OR  Informed Consent: I have reviewed the patients History and Physical, chart, labs and discussed the procedure including the risks, benefits and alternatives for the proposed anesthesia with the patient or authorized representative who has indicated his/her understanding and acceptance.     Dental advisory given  Plan Discussed with: CRNA  Anesthesia Plan Comments: (See PAT note 06/04/2022)       Anesthesia Quick Evaluation

## 2022-11-07 NOTE — H&P (Incomplete)
Orthopedic Spine Surgery H&P Note  Assessment: Patient is a 42 y.o. female with right L4/5 far lateral disc herniation   Plan: -Out of bed as tolerated, activity as tolerated, no brace -Covered the risks of surgery one more time with the patient and patient elected to proceed with planned surgery -Written consent verified -Hold anticoagulation in anticipation of surgery -Ancef and TXA on all to OR -NPO for procedure -Site marked -To OR when ready  ___________________________________________________________________________  Chief Complaint: right leg pain  History: Patient is 42 y.o. female who has been previously seen in the office for right leg pain. Work up revealed a far lateral disc herniation at L4/5 on the right side. She did get relief with an L4/5 diagnostic injection but it did not last. Her pain was radiating into the buttock, posterior thigh, and calf. These symptoms failed to improve with conservative treatment so operative management was discussed at the last office visit. The patient presents today with no changes in their symptoms since the last office visit. See previous office note for further details.    Review of systems: General: denies fevers and chills, myalgias Neurologic: denies recent changes in vision, slurred speech Abdomen: denies nausea, vomiting, hematemesis Respiratory: denies cough, shortness of breath  Past medical history: History of DVT/PE Anxiety/Depression Constipation Migraines GERD HLD Endometriosis Insomnia Sicca syndrome Lupus   Allergies: naltrexone, rosuvastatin, adhesive tape, tramadol, Effexor, Norco, Voltaren   Past surgical history:  Cholecystectomy Hysterectomy Diagnostic laparoscopy Oophorectomy Right rotator cuff repair Basal skull decompression   Social history: Denies use of nicotine product (smoking, vaping, patches, smokeless) Alcohol use: denies Denies recreational drug use  Family history: -reviewed and not  pertinent to far lateral disc herniation   Physical Exam:  General: no acute distress, appears stated age Neurologic: alert, answering questions appropriately, following commands Cardiovascular: regular rate, no cyanosis Respiratory: unlabored breathing on room air, symmetric chest rise Psychiatric: appropriate affect, normal cadence to speech   MSK (spine):  -Strength exam      Left  Right  EHL    5/5  4/5 TA    5/5  4/5 GSC    5/5  5/5 Knee extension  5/5  5/5 Knee flexion   5/5  5/5 Hip flexion   5/5  5/5  -Sensory exam    Sensation intact to light touch in L3-S1 nerve distributions of bilateral lower extremities   Patient name: Kim Pham Patient MRN: [id number] Date: 11/07/22

## 2022-11-08 ENCOUNTER — Other Ambulatory Visit: Payer: Self-pay

## 2022-11-08 ENCOUNTER — Observation Stay (HOSPITAL_COMMUNITY)
Admission: RE | Admit: 2022-11-08 | Discharge: 2022-11-09 | Disposition: A | Discharge status: Home / Self Care | Payer: BC Managed Care – PPO | Attending: Orthopedic Surgery | Admitting: Orthopedic Surgery

## 2022-11-08 ENCOUNTER — Ambulatory Visit (HOSPITAL_COMMUNITY): Payer: BC Managed Care – PPO | Admitting: Certified Registered Nurse Anesthetist

## 2022-11-08 ENCOUNTER — Encounter (HOSPITAL_COMMUNITY): Payer: Self-pay | Admitting: Orthopedic Surgery

## 2022-11-08 ENCOUNTER — Ambulatory Visit (HOSPITAL_COMMUNITY): Payer: BC Managed Care – PPO

## 2022-11-08 ENCOUNTER — Encounter (HOSPITAL_COMMUNITY)
Admission: RE | Disposition: A | Discharge status: Home / Self Care | Payer: Self-pay | Source: Home / Self Care | Attending: Orthopedic Surgery

## 2022-11-08 DIAGNOSIS — Z981 Arthrodesis status: Secondary | ICD-10-CM | POA: Diagnosis not present

## 2022-11-08 DIAGNOSIS — Z86711 Personal history of pulmonary embolism: Secondary | ICD-10-CM | POA: Diagnosis not present

## 2022-11-08 DIAGNOSIS — Z86718 Personal history of other venous thrombosis and embolism: Secondary | ICD-10-CM | POA: Insufficient documentation

## 2022-11-08 DIAGNOSIS — M47816 Spondylosis without myelopathy or radiculopathy, lumbar region: Principal | ICD-10-CM

## 2022-11-08 DIAGNOSIS — D0502 Lobular carcinoma in situ of left breast: Secondary | ICD-10-CM

## 2022-11-08 DIAGNOSIS — M5186 Other intervertebral disc disorders, lumbar region: Principal | ICD-10-CM | POA: Insufficient documentation

## 2022-11-08 DIAGNOSIS — M5126 Other intervertebral disc displacement, lumbar region: Secondary | ICD-10-CM | POA: Diagnosis not present

## 2022-11-08 DIAGNOSIS — Z01818 Encounter for other preprocedural examination: Secondary | ICD-10-CM

## 2022-11-08 DIAGNOSIS — M5416 Radiculopathy, lumbar region: Secondary | ICD-10-CM

## 2022-11-08 HISTORY — PX: LUMBAR LAMINECTOMY/DECOMPRESSION MICRODISCECTOMY: SHX5026

## 2022-11-08 SURGERY — LUMBAR LAMINECTOMY/DECOMPRESSION MICRODISCECTOMY
Anesthesia: General | Site: Spine Lumbar | Laterality: Left

## 2022-11-08 MED ORDER — FENTANYL CITRATE (PF) 250 MCG/5ML IJ SOLN
INTRAMUSCULAR | Status: DC | PRN
  Administered 2022-11-08: 100 ug via INTRAVENOUS
  Administered 2022-11-08 (×3): 50 ug via INTRAVENOUS

## 2022-11-08 MED ORDER — AMISULPRIDE (ANTIEMETIC) 5 MG/2ML IV SOLN: 10.0000 mg | Freq: Once | INTRAVENOUS | Status: DC | PRN

## 2022-11-08 MED ORDER — POVIDONE-IODINE 10 % EX SWAB
2.0000 | Freq: Once | CUTANEOUS | Status: AC
  Administered 2022-11-08: 2 via TOPICAL

## 2022-11-08 MED ORDER — CHLORHEXIDINE GLUCONATE 0.12 % MT SOLN: 15.0000 mL | Freq: Once | OROMUCOSAL | Status: AC

## 2022-11-08 MED ORDER — ADULT MULTIVITAMIN W/MINERALS CH
1.0000 | ORAL_TABLET | Freq: Every day | ORAL | Status: DC
  Administered 2022-11-08: 1 via ORAL
  Filled 2022-11-08: qty 1

## 2022-11-08 MED ORDER — LACTATED RINGERS IV SOLN: INTRAVENOUS | Status: DC | PRN

## 2022-11-08 MED ORDER — CHLORHEXIDINE GLUCONATE 0.12 % MT SOLN
OROMUCOSAL | Status: AC
  Filled 2022-11-08: qty 15

## 2022-11-08 MED ORDER — SCOPOLAMINE 1 MG/3DAYS TD PT72
MEDICATED_PATCH | TRANSDERMAL | Status: DC | PRN
  Administered 2022-11-08: 1 via TRANSDERMAL

## 2022-11-08 MED ORDER — DEXAMETHASONE SODIUM PHOSPHATE 10 MG/ML IJ SOLN: 10.0000 mg | Freq: Once | INTRAMUSCULAR | Status: DC

## 2022-11-08 MED ORDER — ACETAMINOPHEN 500 MG PO TABS: 1000.0000 mg | ORAL_TABLET | Freq: Once | ORAL | Status: DC

## 2022-11-08 MED ORDER — DEXAMETHASONE SODIUM PHOSPHATE 10 MG/ML IJ SOLN
INTRAMUSCULAR | Status: DC | PRN
  Administered 2022-11-08: 10 mg via INTRAVENOUS

## 2022-11-08 MED ORDER — PROPOFOL 10 MG/ML IV BOLUS
INTRAVENOUS | Status: DC | PRN
  Administered 2022-11-08: 170 mg via INTRAVENOUS
  Administered 2022-11-08: 10 mg via INTRAVENOUS
  Administered 2022-11-08: 50 ug/kg/min via INTRAVENOUS

## 2022-11-08 MED ORDER — PROPOFOL 10 MG/ML IV BOLUS
INTRAVENOUS | Status: AC
  Filled 2022-11-08: qty 20

## 2022-11-08 MED ORDER — SURGIFLO WITH THROMBIN (HEMOSTATIC MATRIX KIT) OPTIME
TOPICAL | Status: DC | PRN
  Administered 2022-11-08: 1 via TOPICAL

## 2022-11-08 MED ORDER — ONDANSETRON HCL 4 MG PO TABS: 4.0000 mg | ORAL_TABLET | Freq: Four times a day (QID) | ORAL | Status: DC | PRN

## 2022-11-08 MED ORDER — ACETAMINOPHEN 500 MG PO TABS
1000.0000 mg | ORAL_TABLET | Freq: Three times a day (TID) | ORAL | Status: DC
  Administered 2022-11-08 – 2022-11-09 (×3): 1000 mg via ORAL
  Filled 2022-11-08 (×3): qty 2

## 2022-11-08 MED ORDER — MEPERIDINE HCL 25 MG/ML IJ SOLN: 6.2500 mg | INTRAMUSCULAR | Status: DC | PRN

## 2022-11-08 MED ORDER — ONDANSETRON HCL 4 MG/2ML IJ SOLN: 4.0000 mg | Freq: Four times a day (QID) | INTRAMUSCULAR | Status: DC | PRN

## 2022-11-08 MED ORDER — POLYETHYLENE GLYCOL 3350 17 G PO PACK
17.0000 g | PACK | Freq: Two times a day (BID) | ORAL | Status: DC
  Administered 2022-11-08: 17 g via ORAL
  Filled 2022-11-08 (×2): qty 1

## 2022-11-08 MED ORDER — METHYLPREDNISOLONE ACETATE 40 MG/ML IJ SUSP
INTRAMUSCULAR | Status: AC
  Filled 2022-11-08: qty 1

## 2022-11-08 MED ORDER — HYDROMORPHONE HCL 1 MG/ML IJ SOLN
INTRAMUSCULAR | Status: AC
  Filled 2022-11-08: qty 1

## 2022-11-08 MED ORDER — CEFAZOLIN SODIUM-DEXTROSE 2-4 GM/100ML-% IV SOLN
INTRAVENOUS | Status: AC
  Filled 2022-11-08: qty 100

## 2022-11-08 MED ORDER — KETAMINE HCL 10 MG/ML IJ SOLN
INTRAMUSCULAR | Status: DC | PRN
  Administered 2022-11-08 (×2): 10 mg via INTRAVENOUS
  Administered 2022-11-08: 30 mg via INTRAVENOUS

## 2022-11-08 MED ORDER — ORAL CARE MOUTH RINSE: 15.0000 mL | Freq: Once | OROMUCOSAL | Status: AC

## 2022-11-08 MED ORDER — CEFAZOLIN SODIUM-DEXTROSE 2-4 GM/100ML-% IV SOLN
2.0000 g | INTRAVENOUS | Status: AC
  Administered 2022-11-08 (×2): 2 g via INTRAVENOUS

## 2022-11-08 MED ORDER — PROMETHAZINE HCL 25 MG/ML IJ SOLN: 6.2500 mg | INTRAMUSCULAR | Status: DC | PRN

## 2022-11-08 MED ORDER — SUMATRIPTAN SUCCINATE 100 MG PO TABS: 100.0000 mg | ORAL_TABLET | ORAL | Status: DC | PRN

## 2022-11-08 MED ORDER — MIDAZOLAM HCL 2 MG/2ML IJ SOLN
INTRAMUSCULAR | Status: AC
  Filled 2022-11-08: qty 2

## 2022-11-08 MED ORDER — FAMOTIDINE 20 MG PO TABS
20.0000 mg | ORAL_TABLET | Freq: Every day | ORAL | Status: DC
  Administered 2022-11-08: 20 mg via ORAL
  Filled 2022-11-08: qty 1

## 2022-11-08 MED ORDER — THROMBIN 20000 UNITS EX SOLR
CUTANEOUS | Status: AC
  Filled 2022-11-08: qty 20000

## 2022-11-08 MED ORDER — TRANEXAMIC ACID-NACL 1000-0.7 MG/100ML-% IV SOLN
1000.0000 mg | Freq: Once | INTRAVENOUS | Status: AC
  Administered 2022-11-08: 1000 mg via INTRAVENOUS
  Filled 2022-11-08: qty 100

## 2022-11-08 MED ORDER — POLYETHYLENE GLYCOL 3350 17 G PO PACK: 17.0000 g | PACK | Freq: Every day | ORAL | 0 refills | Status: AC

## 2022-11-08 MED ORDER — BUPIVACAINE-EPINEPHRINE (PF) 0.25% -1:200000 IJ SOLN
INTRAMUSCULAR | Status: AC
  Filled 2022-11-08: qty 30

## 2022-11-08 MED ORDER — KETAMINE HCL 50 MG/5ML IJ SOSY
PREFILLED_SYRINGE | INTRAMUSCULAR | Status: AC
  Filled 2022-11-08: qty 5

## 2022-11-08 MED ORDER — PREGABALIN 75 MG PO CAPS
75.0000 mg | ORAL_CAPSULE | Freq: Two times a day (BID) | ORAL | Status: DC
  Administered 2022-11-08: 75 mg via ORAL
  Filled 2022-11-08: qty 1

## 2022-11-08 MED ORDER — ONDANSETRON HCL 4 MG/2ML IJ SOLN
INTRAMUSCULAR | Status: DC | PRN
  Administered 2022-11-08 (×2): 4 mg via INTRAVENOUS

## 2022-11-08 MED ORDER — METHYLPREDNISOLONE ACETATE 40 MG/ML IJ SUSP
INTRAMUSCULAR | Status: DC | PRN
  Administered 2022-11-08: 40 mg

## 2022-11-08 MED ORDER — DIPHENHYDRAMINE HCL 50 MG/ML IJ SOLN
INTRAMUSCULAR | Status: DC | PRN
  Administered 2022-11-08: 25 mg via INTRAVENOUS

## 2022-11-08 MED ORDER — SUCCINYLCHOLINE CHLORIDE 200 MG/10ML IV SOSY
PREFILLED_SYRINGE | INTRAVENOUS | Status: DC | PRN
  Administered 2022-11-08: 100 mg via INTRAVENOUS

## 2022-11-08 MED ORDER — PROPOFOL 1000 MG/100ML IV EMUL
INTRAVENOUS | Status: AC
  Filled 2022-11-08: qty 200

## 2022-11-08 MED ORDER — OXYCODONE HCL 5 MG PO TABS: 5.0000 mg | ORAL_TABLET | ORAL | 0 refills | Status: DC | PRN

## 2022-11-08 MED ORDER — MIDAZOLAM HCL 2 MG/2ML IJ SOLN
INTRAMUSCULAR | Status: DC | PRN
  Administered 2022-11-08: 2 mg via INTRAVENOUS

## 2022-11-08 MED ORDER — THROMBIN 20000 UNITS EX SOLR
CUTANEOUS | Status: DC | PRN
  Administered 2022-11-08: 20 mL via TOPICAL

## 2022-11-08 MED ORDER — TRANEXAMIC ACID-NACL 1000-0.7 MG/100ML-% IV SOLN
1000.0000 mg | INTRAVENOUS | Status: AC
  Administered 2022-11-08: 1000 mg via INTRAVENOUS

## 2022-11-08 MED ORDER — 0.9 % SODIUM CHLORIDE (POUR BTL) OPTIME
TOPICAL | Status: DC | PRN
  Administered 2022-11-08: 1000 mL

## 2022-11-08 MED ORDER — LIOTHYRONINE SODIUM 5 MCG PO TABS
10.0000 ug | ORAL_TABLET | Freq: Every morning | ORAL | Status: DC
  Administered 2022-11-09: 10 ug via ORAL
  Filled 2022-11-08: qty 2

## 2022-11-08 MED ORDER — PHENYLEPHRINE HCL-NACL 20-0.9 MG/250ML-% IV SOLN
INTRAVENOUS | Status: AC
  Filled 2022-11-08: qty 250

## 2022-11-08 MED ORDER — OXYCODONE HCL 5 MG PO TABS
5.0000 mg | ORAL_TABLET | ORAL | Status: DC | PRN
  Administered 2022-11-08 – 2022-11-09 (×5): 10 mg via ORAL
  Filled 2022-11-08 (×4): qty 2

## 2022-11-08 MED ORDER — ACETAMINOPHEN 500 MG PO TABS: 1000.0000 mg | ORAL_TABLET | Freq: Three times a day (TID) | ORAL | 0 refills | Status: AC

## 2022-11-08 MED ORDER — HYDROMORPHONE HCL 1 MG/ML IJ SOLN
0.5000 mg | INTRAMUSCULAR | Status: AC | PRN
  Administered 2022-11-08: 0.5 mg via INTRAVENOUS
  Filled 2022-11-08: qty 0.5

## 2022-11-08 MED ORDER — METHOCARBAMOL 750 MG PO TABS
750.0000 mg | ORAL_TABLET | Freq: Four times a day (QID) | ORAL | Status: DC
  Administered 2022-11-08 – 2022-11-09 (×2): 750 mg via ORAL
  Filled 2022-11-08 (×3): qty 1

## 2022-11-08 MED ORDER — FENTANYL CITRATE (PF) 250 MCG/5ML IJ SOLN
INTRAMUSCULAR | Status: AC
  Filled 2022-11-08: qty 5

## 2022-11-08 MED ORDER — LEVOTHYROXINE SODIUM 50 MCG PO TABS
50.0000 ug | ORAL_TABLET | Freq: Every day | ORAL | Status: DC
  Administered 2022-11-09: 50 ug via ORAL
  Filled 2022-11-08: qty 1
  Filled 2022-11-08 (×2): qty 2

## 2022-11-08 MED ORDER — SENNA 8.6 MG PO TABS
1.0000 | ORAL_TABLET | Freq: Two times a day (BID) | ORAL | Status: DC
  Administered 2022-11-08 (×2): 8.6 mg via ORAL
  Filled 2022-11-08 (×2): qty 1

## 2022-11-08 MED ORDER — PANTOPRAZOLE SODIUM 40 MG PO TBEC
40.0000 mg | DELAYED_RELEASE_TABLET | Freq: Two times a day (BID) | ORAL | Status: DC
  Administered 2022-11-08: 40 mg via ORAL
  Filled 2022-11-08: qty 1

## 2022-11-08 MED ORDER — SERTRALINE HCL 50 MG PO TABS
50.0000 mg | ORAL_TABLET | Freq: Every day | ORAL | Status: DC
  Administered 2022-11-08: 50 mg via ORAL
  Filled 2022-11-08: qty 1

## 2022-11-08 MED ORDER — OXYCODONE HCL 5 MG PO TABS
ORAL_TABLET | ORAL | Status: AC
  Filled 2022-11-08: qty 2

## 2022-11-08 MED ORDER — DEXMEDETOMIDINE HCL IN NACL 80 MCG/20ML IV SOLN
INTRAVENOUS | Status: DC | PRN
  Administered 2022-11-08 (×2): 4 ug via INTRAVENOUS
  Administered 2022-11-08: 8 ug via INTRAVENOUS
  Administered 2022-11-08 (×4): 4 ug via INTRAVENOUS
  Administered 2022-11-08: 8 ug via INTRAVENOUS

## 2022-11-08 MED ORDER — TRANEXAMIC ACID-NACL 1000-0.7 MG/100ML-% IV SOLN
INTRAVENOUS | Status: AC
  Filled 2022-11-08: qty 100

## 2022-11-08 MED ORDER — LACTATED RINGERS IV SOLN: INTRAVENOUS | Status: DC

## 2022-11-08 MED ORDER — CEFAZOLIN SODIUM-DEXTROSE 2-4 GM/100ML-% IV SOLN
2.0000 g | Freq: Four times a day (QID) | INTRAVENOUS | Status: AC
  Administered 2022-11-08 – 2022-11-09 (×3): 2 g via INTRAVENOUS
  Filled 2022-11-08 (×3): qty 100

## 2022-11-08 MED ORDER — DEXAMETHASONE SODIUM PHOSPHATE 10 MG/ML IJ SOLN
INTRAMUSCULAR | Status: AC
  Filled 2022-11-08: qty 1

## 2022-11-08 MED ORDER — ACETAMINOPHEN 10 MG/ML IV SOLN
INTRAVENOUS | Status: DC | PRN
  Administered 2022-11-08: 1000 mg via INTRAVENOUS

## 2022-11-08 MED ORDER — LIDOCAINE 2% (20 MG/ML) 5 ML SYRINGE
INTRAMUSCULAR | Status: DC | PRN
  Administered 2022-11-08: 80 mg via INTRAVENOUS

## 2022-11-08 MED ORDER — METHOCARBAMOL 750 MG PO TABS: 750.0000 mg | ORAL_TABLET | Freq: Four times a day (QID) | ORAL | 0 refills | Status: AC

## 2022-11-08 MED ORDER — GLYCOPYRROLATE 0.2 MG/ML IJ SOLN
INTRAMUSCULAR | Status: DC | PRN
  Administered 2022-11-08: .2 mg via INTRAVENOUS

## 2022-11-08 MED ORDER — SENNA 8.6 MG PO TABS: 1.0000 | ORAL_TABLET | Freq: Two times a day (BID) | ORAL | 0 refills | Status: AC

## 2022-11-08 MED ORDER — SCOPOLAMINE 1 MG/3DAYS TD PT72: 1.0000 | MEDICATED_PATCH | TRANSDERMAL | Status: DC

## 2022-11-08 MED ORDER — ACETAMINOPHEN 10 MG/ML IV SOLN
INTRAVENOUS | Status: AC
  Filled 2022-11-08: qty 100

## 2022-11-08 MED ORDER — TRAZODONE HCL 100 MG PO TABS
100.0000 mg | ORAL_TABLET | Freq: Every day | ORAL | Status: DC
  Administered 2022-11-08: 100 mg via ORAL
  Filled 2022-11-08: qty 1

## 2022-11-08 MED ORDER — HYDROMORPHONE HCL 1 MG/ML IJ SOLN
0.2500 mg | INTRAMUSCULAR | Status: DC | PRN
  Administered 2022-11-08 (×3): 0.5 mg via INTRAVENOUS

## 2022-11-08 MED ORDER — MELATONIN 5 MG PO TABS
20.0000 mg | ORAL_TABLET | Freq: Every day | ORAL | Status: DC
  Administered 2022-11-08: 20 mg via ORAL
  Filled 2022-11-08: qty 4

## 2022-11-08 SURGICAL SUPPLY — 58 items
AGENT HMST MTR 8 SURGIFLO (HEMOSTASIS) ×1
APL SKNCLS STERI-STRIP NONHPOA (GAUZE/BANDAGES/DRESSINGS) ×1
BENZOIN TINCTURE PRP APPL 2/3 (GAUZE/BANDAGES/DRESSINGS) IMPLANT
BUR NEURO DRILL SOFT 3.0X3.8M (BURR) ×2 IMPLANT
CANISTER SUCT 3000ML PPV (MISCELLANEOUS) ×2 IMPLANT
CORD BIPOLAR FORCEPS 12FT (ELECTRODE) ×2 IMPLANT
COVER MAYO STAND STRL (DRAPES) ×2 IMPLANT
COVER SURGICAL LIGHT HANDLE (MISCELLANEOUS) ×2 IMPLANT
DRAPE C-ARM 42X72 X-RAY (DRAPES) ×2 IMPLANT
DRAPE MICROSCOPE LEICA 54X105 (DRAPES) ×2 IMPLANT
DRAPE UTILITY XL STRL (DRAPES) ×2 IMPLANT
DRESSING MEPILEX FLEX 4X4 (GAUZE/BANDAGES/DRESSINGS) ×2 IMPLANT
DRSG MEPILEX FLEX 4X4 (GAUZE/BANDAGES/DRESSINGS)
DRSG MEPILEX POST OP 4X8 (GAUZE/BANDAGES/DRESSINGS) IMPLANT
DRSG TEGADERM 4X10 (GAUZE/BANDAGES/DRESSINGS) ×2 IMPLANT
DRSG TEGADERM 4X4.75 (GAUZE/BANDAGES/DRESSINGS) IMPLANT
DURAPREP 26ML APPLICATOR (WOUND CARE) ×2 IMPLANT
ELECT BLADE 4.0 EZ CLEAN MEGAD (MISCELLANEOUS) ×1
ELECT BLADE INSULATED 4IN (ELECTROSURGICAL) ×1
ELECT PENCIL ROCKER SW 15FT (MISCELLANEOUS) ×2 IMPLANT
ELECT REM PT RETURN 9FT ADLT (ELECTROSURGICAL) ×1
ELECTRODE BLADE INSULATED 4IN (ELECTROSURGICAL) ×2 IMPLANT
ELECTRODE BLDE 4.0 EZ CLN MEGD (MISCELLANEOUS) ×2 IMPLANT
ELECTRODE REM PT RTRN 9FT ADLT (ELECTROSURGICAL) ×2 IMPLANT
GLOVE BIO SURGEON STRL SZ7.5 (GLOVE) ×2 IMPLANT
GLOVE INDICATOR 7.5 STRL GRN (GLOVE) ×2 IMPLANT
GOWN STRL REUS W/ TWL LRG LVL3 (GOWN DISPOSABLE) ×2 IMPLANT
GOWN STRL REUS W/TWL LRG LVL3 (GOWN DISPOSABLE) ×1
GOWN STRL SURGICAL XL XLNG (GOWN DISPOSABLE) ×2 IMPLANT
KIT BASIN OR (CUSTOM PROCEDURE TRAY) ×2 IMPLANT
KIT POSITION SURG JACKSON T1 (MISCELLANEOUS) ×2 IMPLANT
KIT TURNOVER KIT B (KITS) ×2 IMPLANT
NDL 22X1.5 STRL (OR ONLY) (MISCELLANEOUS) ×2 IMPLANT
NDL SPNL 22GX3.5 QUINCKE BK (NEEDLE) IMPLANT
NEEDLE 22X1.5 STRL (OR ONLY) (MISCELLANEOUS) ×1
NEEDLE SPNL 22GX3.5 QUINCKE BK (NEEDLE) ×1
NS IRRIG 1000ML POUR BTL (IV SOLUTION) ×2 IMPLANT
PACK LAMINECTOMY ORTHO (CUSTOM PROCEDURE TRAY) ×2 IMPLANT
PATTIES SURGICAL .5 X.5 (GAUZE/BANDAGES/DRESSINGS) ×2 IMPLANT
PROBE MMG BALL TIP ADJ CTRL (DISPOSABLE) IMPLANT
SENSOR MMG W/GROUND PAD SERTI (DISPOSABLE) ×1 IMPLANT
SPONGE SURGIFLO 8M (HEMOSTASIS) IMPLANT
SPONGE SURGIFOAM ABS GEL 100 (HEMOSTASIS) ×2 IMPLANT
SPONGE T-LAP 4X18 ~~LOC~~+RFID (SPONGE) ×2 IMPLANT
STRIP CLOSURE SKIN 1/2X4 (GAUZE/BANDAGES/DRESSINGS) IMPLANT
SUCTION TUBE FRAZIER 10FR DISP (SUCTIONS) ×2 IMPLANT
SUT BONE WAX W31G (SUTURE) ×2 IMPLANT
SUT MNCRL AB 3-0 PS2 18 (SUTURE) ×2 IMPLANT
SUT VIC AB 0 CT1 18XCR BRD8 (SUTURE) ×2 IMPLANT
SUT VIC AB 0 CT1 8-18 (SUTURE) ×1
SUT VIC AB 2-0 CT1 18 (SUTURE) ×2 IMPLANT
SYR BULB IRRIG 60ML STRL (SYRINGE) ×2 IMPLANT
SYR CONTROL 10ML LL (SYRINGE) ×2 IMPLANT
TOWEL GREEN STERILE (TOWEL DISPOSABLE) ×2 IMPLANT
TOWEL GREEN STERILE FF (TOWEL DISPOSABLE) ×2 IMPLANT
TRAY FOLEY W/BAG SLVR 14FR (SET/KITS/TRAYS/PACK) IMPLANT
TUBING FEATHERFLOW (TUBING) ×2 IMPLANT
WATER STERILE IRR 1000ML POUR (IV SOLUTION) ×2 IMPLANT

## 2022-11-08 NOTE — Discharge Instructions (Signed)
Orthopedic Surgery Discharge Instructions  Patient name: Kim Pham Procedure Performed: right L4/5 far lateral discectomy Date of Surgery: 11/08/2022 Surgeon: Willia Craze, MD  Pre-operative Diagnosis: L4/5 far lateral disc herniation Post-operative Diagnosis: same as above  Discharge Date: 11/09/2022 Discharged to: home Discharge Condition: stable  Activity: You should refrain from bending, lifting, or twisting with objects greater than ten pounds until six weeks after surgery. You are encouraged to walk as much as desired. You can perform household activities such as cleaning dishes, doing laundry, vacuuming, etc. as long as the ten-pound restriction is followed. You do not need to wear a brace during the post-operative period.   Incision Care: Your incision site has a dressing over it. That dressing should remain in place and dry at all times for a total of one week after surgery. After one week, you can remove the dressing. Underneath the dressing, you will find pieces of tape. You should leave these pieces of tape in place. They will fall off with time. Do not pick, rub, or scrub at them. Do not put cream or lotion over the surgical area. After one week and once the dressing is off, it is okay to let soap and water run over your incision. Again, do not pick, scrub, or rub at the pieces of tape when bathing. Do not submerge (e.g., take a bath, swim, go in a hot tub, etc.) until six weeks after surgery. There may be some bloody drainage from the incision into the dressing after surgery. This is normal. You do not need to replace the dressing. Continue to leave it in place for the one week as instructed above. Should the dressing become saturated with blood or drainage, please call the office for further instructions.   Medications: You have been prescribed oxycodone. This is a narcotic pain medication and should only be taken as prescribed. You should not drink alcohol or operate heavy  machinery (including driving) while taking this medication. The oxycodone can cause constipation as a side effect. For that reason, you have been prescribed senna and miralax. These are both laxatives. You do not need to take this medication if you develop diarrhea. Should you remain constipated even while taking these medications, please increase the dose of miralax to twice daily. Tylenol has been prescribed to be taken every 8 hours, which will give you additional pain relief. Robaxin is a muscle relaxer that has been prescribed to you for muscle spasm type pain. Take this medication as needed.   Do not take NSAIDs (ibuprofen, Aleve, Celebrex, naproxen, meloxicam, etc.) because they can think your blood similar to eliquis  In order to set expectations for opioid prescriptions, you will only be prescribed opioids for a total of six weeks after surgery and, at two-weeks after surgery, your opioid prescription will start to tapered (decreased dosage and number of pills). If you have ongoing need for opioid medication six weeks after surgery, you will be referred to pain management. If you are already established with a provider that is giving you opioid medications, you should schedule an appointment with them for six weeks after surgery if you feel you are going to need another prescription. State law only allows for opioid prescriptions one week at a time. If you are running out of opioid medication near the end of the week, please call the office during business hours before running out so I can send you another prescription.   You may resume any home blood thinners (warfarin, lovenox, apixaban (  eliquis), plavix, xarelto, etc) 72 hours after your surgery. Take these medications as they were previously prescribed.  Driving: You should not drive while taking narcotic pain medications. You should start getting back to driving slowly and you may want to try driving in a parking lot before doing anything more.    Diet: You are safe to resume your regular diet after surgery.   Reasons to Call the Office After Surgery: You should feel free to call the office with any concerns or questions you have in the post-operative period, but you should definitely notify the office if you develop: -shortness of breath, chest pain, or trouble breathing -excessive bleeding, drainage, redness, or swelling around the surgical site -fevers, chills, or pain that is getting worse with each passing day -persistent nausea or vomiting -new weakness in either leg -new or worsening numbness or tingling in either leg -numbness in the groin, bowel or bladder incontinence -other concerns about your surgery  Follow Up Appointments: You should have an office appointment scheduled for approximately two weeks after surgery. If you do not remember when this appointment is or do not already have it scheduled, please call the office to schedule.   Office Information:  -Willia Craze, MD -Phone number: [phone] -Address: [address]       [place], [place] [address]

## 2022-11-08 NOTE — Anesthesia Procedure Notes (Signed)
Procedure Name: Intubation Date/Time: 11/08/2022 7:49 AM  Performed by: Chelsea Aus, CRNAPre-anesthesia Checklist: Patient identified, Emergency Drugs available, Suction available and Patient being monitored Patient Re-evaluated:Patient Re-evaluated prior to induction Oxygen Delivery Method: Circle system utilized Preoxygenation: Pre-oxygenation with 100% oxygen Induction Type: IV induction Ventilation: Mask ventilation without difficulty Laryngoscope Size: Mac and 3 Grade View: Grade II Tube type: Oral Tube size: 7.0 mm Number of attempts: 1 Placement Confirmation: ETT inserted through vocal cords under direct vision, positive ETCO2 and breath sounds checked- equal and bilateral Secured at: 21 cm Tube secured with: Tape Dental Injury: Teeth and Oropharynx as per pre-operative assessment

## 2022-11-08 NOTE — Discharge Summary (Signed)
Orthopedic Surgery Discharge Summary  Patient name: FUMI GUADRON Patient MRN: [id number] Admit today: 11/08/2022 Discharge date: 11/09/2022  Attending physician: Willia Craze, MD Final diagnosis: L4/5 far lateral disc herniation  Findings: L4/5 far lateral disc herniation on the right side  Hospital course: Patient is a 42 y.o. female who was admitted after undergoing right-sided L4/5 far lateral discectomy. The patient had significant pain immediately after surgery, but pain eventually was controlled with a multimodal regimen including oxycodone. Labs during the hospitalization revealed hemoglobin on 10.6. She was asymptomatic and was not hypotensive at discharge so no intervention was performed. The patient was walking the halls without assistance. The patient was tolerating an oral diet without issue and was voiding spontaneously after surgery. The patient's vitals were stable on the day of discharge. The patient was medically ready for discharge and was discharge to home on post-operative day 1.  Instructions:   Orthopedic Surgery Discharge Instructions  Patient name: CHARYL MINERVINI Procedure Performed: right L4/5 far lateral discectomy Date of Surgery: 11/08/2022 Surgeon: Willia Craze, MD  Pre-operative Diagnosis: L4/5 far lateral disc herniation Post-operative Diagnosis: same as above  Discharge Date: 11/09/2022 Discharged to: home Discharge Condition: stable  Activity: You should refrain from bending, lifting, or twisting with objects greater than ten pounds until six weeks after surgery. You are encouraged to walk as much as desired. You can perform household activities such as cleaning dishes, doing laundry, vacuuming, etc. as long as the ten-pound restriction is followed. You do not need to wear a brace during the post-operative period.   Incision Care: Your incision site has a dressing over it. That dressing should remain in place and dry at all times for a total of one  week after surgery. After one week, you can remove the dressing. Underneath the dressing, you will find pieces of tape. You should leave these pieces of tape in place. They will fall off with time. Do not pick, rub, or scrub at them. Do not put cream or lotion over the surgical area. After one week and once the dressing is off, it is okay to let soap and water run over your incision. Again, do not pick, scrub, or rub at the pieces of tape when bathing. Do not submerge (e.g., take a bath, swim, go in a hot tub, etc.) until six weeks after surgery. There may be some bloody drainage from the incision into the dressing after surgery. This is normal. You do not need to replace the dressing. Continue to leave it in place for the one week as instructed above. Should the dressing become saturated with blood or drainage, please call the office for further instructions.   Medications: You have been prescribed oxycodone. This is a narcotic pain medication and should only be taken as prescribed. You should not drink alcohol or operate heavy machinery (including driving) while taking this medication. The oxycodone can cause constipation as a side effect. For that reason, you have been prescribed senna and miralax. These are both laxatives. You do not need to take this medication if you develop diarrhea. Should you remain constipated even while taking these medications, please increase the dose of miralax to twice daily. Tylenol has been prescribed to be taken every 8 hours, which will give you additional pain relief. Robaxin is a muscle relaxer that has been prescribed to you for muscle spasm type pain. Take this medication as needed.   Do not take NSAIDs (ibuprofen, Aleve, Celebrex, naproxen, meloxicam, etc.) because they can think  your blood similar to eliquis  In order to set expectations for opioid prescriptions, you will only be prescribed opioids for a total of six weeks after surgery and, at two-weeks after surgery,  your opioid prescription will start to tapered (decreased dosage and number of pills). If you have ongoing need for opioid medication six weeks after surgery, you will be referred to pain management. If you are already established with a provider that is giving you opioid medications, you should schedule an appointment with them for six weeks after surgery if you feel you are going to need another prescription. State law only allows for opioid prescriptions one week at a time. If you are running out of opioid medication near the end of the week, please call the office during business hours before running out so I can send you another prescription.   You may resume any home blood thinners (warfarin, lovenox, apixaban (eliquis), plavix, xarelto, etc) 72 hours after your surgery. Take these medications as they were previously prescribed.  Driving: You should not drive while taking narcotic pain medications. You should start getting back to driving slowly and you may want to try driving in a parking lot before doing anything more.   Diet: You are safe to resume your regular diet after surgery.   Reasons to Call the Office After Surgery: You should feel free to call the office with any concerns or questions you have in the post-operative period, but you should definitely notify the office if you develop: -shortness of breath, chest pain, or trouble breathing -excessive bleeding, drainage, redness, or swelling around the surgical site -fevers, chills, or pain that is getting worse with each passing day -persistent nausea or vomiting -new weakness in either leg -new or worsening numbness or tingling in either leg -numbness in the groin, bowel or bladder incontinence -other concerns about your surgery  Follow Up Appointments: You should have an office appointment scheduled for approximately two weeks after surgery. If you do not remember when this appointment is or do not already have it scheduled, please  call the office to schedule.   Office Information:  -Willia Craze, MD -Phone number: [phone] -Address: [address]       [place], [place] [address]

## 2022-11-08 NOTE — Transfer of Care (Signed)
Immediate Anesthesia Transfer of Care Note  Patient: Kim Pham  Procedure(s) Performed: L4-5 FAR LEFT LATERAL DISCECTOMY (Left: Spine Lumbar)  Patient Location: PACU  Anesthesia Type:General  Level of Consciousness: awake, oriented, drowsy, patient cooperative, and responds to stimulation  Airway & Oxygen Therapy: Patient Spontanous Breathing  Post-op Assessment: Report given to RN  Post vital signs: Reviewed and stable  Last Vitals:  Vitals Value Taken Time  BP 102/58 11/08/22 1220  Temp 37.1 C 11/08/22 1220  Pulse 82 11/08/22 1224  Resp 12 11/08/22 1224  SpO2 98 % 11/08/22 1224  Vitals shown include unfiled device data.  Last Pain:  Vitals:   11/08/22 0619  PainSc: 6          Complications: No notable events documented.

## 2022-11-08 NOTE — Brief Op Note (Signed)
11/08/2022  12:27 PM  PATIENT:  Kim Pham  42 y.o. female  PRE-OPERATIVE DIAGNOSIS:  L4-5 FAR LATERAL DISC HERNIATION  POST-OPERATIVE DIAGNOSIS:  L4-5 FAR LATERAL DISC HERNIATION  PROCEDURE:  Procedure(s): L4-5 FAR LEFT LATERAL DISCECTOMY (Left)  SURGEON:  Surgeons and Role:    London Sheer, MD - Primary  PHYSICIAN ASSISTANT: none  ASSISTANTS: none   ANESTHESIA:   general  EBL:  100 mL   BLOOD ADMINISTERED:none  DRAINS: none   LOCAL MEDICATIONS USED:  NONE  SPECIMEN:  No Specimen  DISPOSITION OF SPECIMEN:  N/A  COUNTS:  YES  TOURNIQUET: none  DICTATION: .Note written in EPIC  PLAN OF CARE: Admit for overnight observation  PATIENT DISPOSITION:  PACU - hemodynamically stable.   Delay start of Pharmacological VTE agent (>24hrs) due to surgical blood loss or risk of bleeding: yes

## 2022-11-08 NOTE — Progress Notes (Signed)
Orthopedic Surgery Post-operative Progress Note  Assessment: Patient is a 42 y.o. female who is currently admitted after undergoing L4/5 right-sided far lateral discectomy   Plan: -Operative plans complete -Drain: none -Out of bed as tolerated, no brace -No bending/lifting/twisting greater than 10 pounds -Pain control -Regular diet -No chemoprophylaxis for dvt or antiplatelets for 72 hours after surgery -Ancef x2 post-operative doses -Disposition: to floor from PACU  ___________________________________________________________________________   Subjective: No acute events since surgery. Pain well controlled in PACU. No nausea or emesis.  Objective:  General: no acute distress, appropriate affect Neurologic: alert, answering questions appropriately, following commands Respiratory: unlabored breathing on room air Skin: dressing clear/dry/intact  MSK (spine):  -Strength exam      Right  Left  EHL    4+/5  5/5 TA    4+/5  5/5 GSC    5/5  5/5 Knee extension  5/5  5/5 Hip flexion   5/5  5/5  -Sensory exam    Sensation intact to light touch in L3-S1 nerve distributions of bilateral lower extremities   Patient name: Kim Pham Patient MRN: [id number] Date: 11/08/22

## 2022-11-08 NOTE — Op Note (Addendum)
Orthopedic Spine Surgery Operative Report  Procedure: L4/5 far lateral discectomy  Modifier: none  Date of procedure: 11/08/2022  Patient name: Kim Pham MRN: [id number] DOB: May 13, 1980  Surgeon: Willia Craze, MD Assistant: None Pre-operative diagnosis: L4/5 right-sided far lateral disc herniation Post-operative diagnosis: same as above Findings: right-sided L4/5 far lateral disc herniation  Specimens: none Anesthesia: general EBL: 100cc Complications: none Pre-incision antibiotic: ancef TXA given prior to incision as well   Implants: none   Indication for procedure: Patient is a 42 y.o. female who presented to the office with symptoms consistent with lumbar radiculopathy. Work up revealed a L4/5 right-sided far lateral disc herniation causing a L4 radiculopathy. She had responded to a diagnostic L4 injection. The patient had tried conservative treatments that did not provide any lasting relief. As result, operative management was discussed. L4/5 right-sided far lateral discectomy was presented as a treatment option. The risks including but not limited to iatrogenic instability, dural tear, nerve root injury, paralysis, persistent pain, infection, bleeding, recurrent disc herniation, heart attack, death, stroke, fracture, and need for additional procedures were discussed with the patient. The benefit of the surgery would be relief of the patient's radiating right leg pain. The alternatives to surgical management were covered with the patient and included continued monitoring, physical therapy, over-the-counter pain medications, ambulatory aids, repeat injections, and activity modification. All the patient's questions were answered to her satisfaction. After this discussion, the patient expressed understanding and elected to proceed with surgical intervention.   Procedure Description: The patient was met in the pre-operative holding area. The patient's identity and consent were  verified. The operative site was marked. The patient's remaining questions about the surgery were answered. The patient was brought back to the operating room. General anesthesia was induced and an endotracheal tube was placed by the anesthesia staff. The patient was transferred to the prone [place] table in the prone position. All bony prominences were well padded. The head of the bed was slightly elevated and the eyes were free from compression by the face pillow. The surgical area was cleansed with alcohol. Fluoroscopy was then brought in to check rotation on the AP image and to mark the levels on the lateral image. The patient's skin was then prepped and draped in a standard, sterile fashion. A time out was performed that identified the patient, the procedure, and the operative levels. All team members agreed with what was stated in the time out.   A longitudinal incision was made 3.5cm lateral to the spinous processes on the right side. Sharp dissection was continued down through the skin and dermis. Electrocautery was then used to continue the dissection down to fascia. A longitudinal incision was made in the fascia. Finger dissection was used to dissect the intermuscular plane to the level of the L4/5 and L3/4 facet joints. The L5 and L4 transverse processes were exposed and electrocautery was used to dissect the soft tissue off of the transverse processes. A burr was used to thin the lateral aspect of the L4/5 facet and the superior aspect of the L5 transverse process. The lateral aspect of the pars was also thinned with a burr. A kerrison was used to remove the thinned bone. Blunt dissection was performed with a penfield and currette to expose the exiting L4 nerve root. Sentio was used to stimulate the L4 nerve root - there was a response at 9. The disc space was visualized in the wound. Intra-operative fluoroscopy confirmed the correct level. A nerve root retractor was used  to protect and mobilize the L4  nerve root off of the disc space. A 15 blade knife was used to make an annulotomy. A pituitary was used to remove loose disc fragment. Then, a combination of an epstein curette and upbiting pituitary was used to remove further loose disc. A nerve hook was placed into the annulotomy and no further loose disc was found. At this point, Sentio was used again to stimulate the L4 nerve and a response was obtained at 2. Wound was then copiously irrigated with sterile saline. The fascia was reapproximated with 0 vicryl. The fat was reapproximated with 0 vicryl. The deep dermal layer was reapproximated with 2-0 vicryl. The skin was closed with a 3-0 monocryl. All counts were correct at the end of the case. The incision was dressed with benzoine and steri strips. An island dressing was placed over the wound. The patient was transferred back to a bed and brought to the post-anesthesia care unit by anesthesia staff in stable condition.  Post-operative plan: The patient will recover in the post-anesthesia care unit and then go to the floor. The patient will receive two post-operative doses of ancef. The patient will be out of bed as tolerated with no brace. The patient will get another dose of TXA. The patient will likely discharge to home tomorrow.        Willia Craze, MD Orthopedic Surgeon

## 2022-11-09 ENCOUNTER — Encounter (HOSPITAL_COMMUNITY): Payer: Self-pay | Admitting: Orthopedic Surgery

## 2022-11-09 ENCOUNTER — Ambulatory Visit: Payer: BC Managed Care – PPO | Admitting: Gastroenterology

## 2022-11-09 DIAGNOSIS — Z86718 Personal history of other venous thrombosis and embolism: Secondary | ICD-10-CM | POA: Diagnosis not present

## 2022-11-09 DIAGNOSIS — Z86711 Personal history of pulmonary embolism: Secondary | ICD-10-CM | POA: Diagnosis not present

## 2022-11-09 DIAGNOSIS — M5186 Other intervertebral disc disorders, lumbar region: Secondary | ICD-10-CM | POA: Diagnosis not present

## 2022-11-09 LAB — BASIC METABOLIC PANEL
Anion gap: 6 (ref 5–15)
BUN: 5 mg/dL — ABNORMAL LOW (ref 6–20)
CO2: 25 mmol/L (ref 22–32)
Calcium: 8.7 mg/dL — ABNORMAL LOW (ref 8.9–10.3)
Chloride: 107 mmol/L (ref 98–111)
Creatinine, Ser: 0.84 mg/dL (ref 0.44–1.00)
GFR, Estimated: >60 mL/min (ref >60)
Glucose, Bld: 140 mg/dL — ABNORMAL HIGH (ref 70–99)
Potassium: 4.6 mmol/L (ref 3.5–5.1)
Sodium: 138 mmol/L (ref 135–145)

## 2022-11-09 LAB — CBC
HCT: 32.5 % — ABNORMAL LOW (ref 36.0–46.0)
Hemoglobin: 10.6 g/dL — ABNORMAL LOW (ref 12.0–15.0)
MCH: 29.7 pg (ref 26.0–34.0)
MCHC: 32.6 g/dL (ref 30.0–36.0)
MCV: 91 fL (ref 80.0–100.0)
Platelets: 202 10*3/uL (ref 150–400)
RBC: 3.57 MIL/uL — ABNORMAL LOW (ref 3.87–5.11)
RDW: 13.6 % (ref 11.5–15.5)
WBC: 6.8 10*3/uL (ref 4.0–10.5)
nRBC: 0 % (ref 0.0–0.2)

## 2022-11-09 NOTE — Anesthesia Postprocedure Evaluation (Signed)
Anesthesia Post Note  Patient: Kim Pham  Procedure(s) Performed: L4-5 FAR LEFT LATERAL DISCECTOMY (Left: Spine Lumbar)     Patient location during evaluation: PACU Anesthesia Type: General Level of consciousness: sedated and patient cooperative Pain management: pain level controlled Vital Signs Assessment: post-procedure vital signs reviewed and stable Respiratory status: spontaneous breathing Cardiovascular status: stable Anesthetic complications: no   No notable events documented.  Last Vitals:  Vitals:   11/09/22 0746 11/09/22 0749  BP: 91/64 91/61  Pulse: 65 (!) 56  Resp: 16   Temp: 36.8 C   SpO2: 99%     Last Pain:  Vitals:   11/09/22 0759  TempSrc:   PainSc: 7                  Lewie Loron

## 2022-11-09 NOTE — Progress Notes (Signed)
PT Cancellation Note  Patient Details Name: KAMY POINSETT MRN: [id number] DOB: 1981-03-25   Cancelled Treatment:    Reason Eval/Treat Not Completed: PT screened, no needs identified, will sign off. PT ambulating in room upon PT arrival, fully dressed. Pt denies concerns about mobility at this time, reports she does not have to negotiate stairs at this time. PT provides handout for back precautions. Pt declines the need for PT evaluation at this time. Pt does not appear to have acute PT needs. PT signing off.   Arlyss Gandy 11/09/2022, 8:33 AM

## 2022-11-09 NOTE — Progress Notes (Signed)
Patient alert and oriented, void, ambulate. Surgical site clean and dry. D/c instructions explain and given, all questions answered. Patient d/c home per order.

## 2022-11-09 NOTE — Progress Notes (Signed)
Orthopedic Surgery Post-operative Progress Note  Assessment: Patient is a 42 y.o. female who is currently admitted after undergoing L4/5 right-sided far lateral discectomy   Plan: -Operative plans complete -Drain: none -Out of bed as tolerated, no brace -No bending/lifting/twisting greater than 10 pounds -Pain control -Regular diet -No chemoprophylaxis for dvt or antiplatelets for 72 hours after surgery -Ancef x2 post-operative doses -Anticipate discharge to home today  Acute blood loss anemia from surgical blood loss -hgb 10.6 this morning -will monitor for signs/symptoms of anemia  Left anterolateral thigh numbness -seems consistent with LFCN numbness from positioning and the hip pad -told her we will monitor and I expect it will slowly improve  ___________________________________________________________________________   Subjective: No acute events overnight. Not having any right leg pain this morning. Having back pain and soreness especially with movement out of bed. Reports some numbness in her left anterolateral thigh that started after surgery. No other numbness or paresthesias.    Objective:  General: no acute distress, appropriate affect Neurologic: alert, answering questions appropriately, following commands Respiratory: unlabored breathing on room air Skin: dressing clear/dry/intact  MSK (spine):  -Strength exam      Right  Left  EHL    4+/5  5/5 TA    4+/5  5/5 GSC    5/5  5/5 Knee extension  5/5  5/5 Hip flexion   5/5  5/5  -Sensory exam    Sensation intact to light touch in L3-S1 nerve distributions of bilateral lower extremities (decreased in LFCN distribution on the left)    Patient name: Kim Pham Patient MRN: [id number] Date: 11/09/22

## 2022-11-09 NOTE — TOC Transition Note (Signed)
Transition of Care [facility]) - CM/SW Discharge Note   Patient Details  Name: Kim Pham MRN: [id number] Date of Birth: [birth year]  Transition of Care [facility]) CM/SW Contact:  Tom-Johnson, Hershal Coria, RN Phone Number: 11/09/2022, 9:23 AM   Clinical Narrative:     Patient is scheduled for discharge today.  Hospital f/u and discharge instructions on AVS. No TOC needs or recommendations noted. Family to transport at discharge.  No further TOC needs noted.       Final next level of care: Home/Self Care Barriers to Discharge: Barriers Resolved   Patient Goals and CMS Choice CMS Medicare.gov Compare Post Acute Care list provided to:: Patient Choice offered to / list presented to : NA  Discharge Placement                  Patient to be transferred to facility by: Husband Name of family member notified: [facility] Additional resources added to the After Visit Summary for                  DME Arranged: N/A DME Agency: NA       HH Arranged: NA HH Agency: NA        Social Determinants of Health (SDOH) Interventions SDOH Screenings   Food Insecurity: Food Insecurity Present (09/14/2022)   Received from [facility], [facility]  Housing: Low Risk  (06/08/2022)  Transportation Needs: No Transportation Needs (09/14/2022)   Received from [facility], [facility]  Utilities: Not At Risk (09/14/2022)   Received from [facility], [facility]  Depression [phone]): Low Risk  (06/25/2022)  Financial Resource Strain: Medium Risk (09/14/2022)   Received from [facility], [facility]  Physical Activity: Insufficiently Active (09/14/2022)   Received from [facility], [facility]  Social Connections: Socially Integrated (09/14/2022)   Received from [facility], [facility]  Stress: Stress Concern Present (09/14/2022)   Received from [facility], [facility]  Tobacco Use: Medium Risk (11/08/2022)     Readmission  Risk Interventions     No data to display

## 2022-11-10 ENCOUNTER — Other Ambulatory Visit: Payer: Self-pay | Admitting: Family

## 2022-11-10 DIAGNOSIS — N6452 Nipple discharge: Secondary | ICD-10-CM

## 2022-11-11 ENCOUNTER — Other Ambulatory Visit: Payer: Self-pay | Admitting: Family

## 2022-11-11 DIAGNOSIS — E039 Hypothyroidism, unspecified: Secondary | ICD-10-CM

## 2022-11-13 ENCOUNTER — Encounter (HOSPITAL_COMMUNITY): Payer: Self-pay | Admitting: Orthopedic Surgery

## 2022-11-14 ENCOUNTER — Encounter: Payer: Self-pay | Admitting: Orthopedic Surgery

## 2022-11-14 DIAGNOSIS — Z9889 Other specified postprocedural states: Secondary | ICD-10-CM

## 2022-11-14 DIAGNOSIS — M5416 Radiculopathy, lumbar region: Secondary | ICD-10-CM

## 2022-11-15 ENCOUNTER — Telehealth: Payer: Self-pay | Admitting: Orthopedic Surgery

## 2022-11-15 ENCOUNTER — Telehealth: Payer: Self-pay

## 2022-11-15 ENCOUNTER — Other Ambulatory Visit: Payer: Self-pay | Admitting: Orthopedic Surgery

## 2022-11-15 MED ORDER — METHYLPREDNISOLONE 4 MG PO TBPK: ORAL_TABLET | ORAL | 0 refills | Status: DC

## 2022-11-15 MED ORDER — OXYCODONE HCL 5 MG PO TABS: 5.0000 mg | ORAL_TABLET | ORAL | 0 refills | Status: AC | PRN

## 2022-11-15 NOTE — Telephone Encounter (Signed)
Orthopedic Telephone Call  Spoke to patient this afternoon on the phone.  She had been doing well after discharge from the hospital.  Her leg pain was significantly better.  She was taking oxycodone 5 mg every 8 hours or so.  Then, over the weekend she was retching and noted onset of worsening right leg pain.  She feels the pain in the right buttock and along the posterior aspect of the right thigh to about the level of the knee.  She does not have any pain radiating into the left lower extremity.  She has started to take increased oxycodone to help with the pain.  She has resumed taking gabapentin to help with the pain as well.  She feels that the gabapentin is mildly helpful.  The oxycodone has been helpful.  She took off the dressing today as instructed on the discharge paperwork and said that the incision has no drainage, is well-approximated, and has no erythema.  She mention recurrent disc herniation is a possibility since I discussed that preoperatively.  I told her that the retching may have caused a recurrent disc herniation.  For now, I recommended symptomatic treatment and if her pain persist then would get a repeat MRI.  I prescribed her oxycodone and prednisone.  She should continue to take the gabapentin.  She can also use Tylenol.  She already has a follow-up appointment scheduled with me for 1 week from now.   London Sheer, MD Orthopedic Surgeon

## 2022-11-15 NOTE — Telephone Encounter (Signed)
Requesting: Oxycodone 5mg  Contract: N/A UDS: N/A Last Visit: 10/22/2022 Next Visit: N/A Last Refill: 11/08/2022   Please Advise

## 2022-11-15 NOTE — Telephone Encounter (Signed)
Patient called back returning a call from Dr. Christell Constant she said she would wait until 5 when he calls back.

## 2022-11-19 ENCOUNTER — Ambulatory Visit: Payer: BC Managed Care – PPO | Admitting: Family

## 2022-11-22 ENCOUNTER — Ambulatory Visit (INDEPENDENT_AMBULATORY_CARE_PROVIDER_SITE_OTHER): Payer: BC Managed Care – PPO | Admitting: Orthopedic Surgery

## 2022-11-22 DIAGNOSIS — M5416 Radiculopathy, lumbar region: Secondary | ICD-10-CM

## 2022-11-22 DIAGNOSIS — Z9889 Other specified postprocedural states: Secondary | ICD-10-CM

## 2022-11-22 NOTE — Progress Notes (Signed)
Orthopedic Surgery Post-operative Office Visit  Procedure: L4/5 right-sided far lateral discectomy Date of Surgery: 11/08/2022 (~2 weeks post-op)  Assessment: Patient is a 42 y.o. who has had a recurrence of symptoms after retching for a weekend. Symptoms are radiating right leg pain into the buttock and lateral thigh into the leg   Plan: -Told her that based on her doing well with sudden recurrence after retching, I suspect that this is a recurrent disc herniation. I explained that most disc herniations get better with non-operative treatments including recurrent disc herniation, so I recommended non-operative treatment at this time. She can continue with oxycodone, gabapentin, and tylenol for now. I told her that revision far lateral discectomy would involve a L4/5 TLIF which I am hesitant to rush into given her age and the likelihood that she develops adjacent segment disease in the future. She expressed understanding of the rationale of the plan -If she is not doing any better at our next visit, will order an MRI of the lumbar spine to evaluate for recurrent disc herniation -Return to office in 4 weeks, lumbar x-rays needed at next visit: none  ___________________________________________________________________________   Subjective: Patient had been doing well after surgery. Her leg pain had completely resolved. She said she was retching over a weekend and developed right leg pain in the same distribution as it was pre-operatively. She says that it was worse than before surgery. I prescribed a medrol dosepak and that did not help. She is currently using oxycodone, tylenol, and gabapentin which makes the pain more tolerable. No radiating left leg symptoms. Denies paresthesias and numbness.   Objective:  General: no acute distress, appropriate affect Neurologic: alert, answering questions appropriately, following commands Respiratory: unlabored breathing on room air Skin: incision is well  approximated with no erythema, induration, or active/expressible drainage  MSK (spine):  -Strength exam      Left  Right  EHL    5/5  5/5 TA    5/5  5/5 GSC    5/5  5/5 Knee extension  5/5  5/5 Hip flexion   5/5  5/5  -Sensory exam    Sensation intact to light touch in L3-S1 nerve distributions of bilateral lower extremities  Imaging: None obtained today   Patient name: Kim Pham Patient MRN: [id number] Date of visit: 11/22/22

## 2022-11-23 MED ORDER — GABAPENTIN 300 MG PO CAPS: 300.0000 mg | ORAL_CAPSULE | Freq: Three times a day (TID) | ORAL | 2 refills | Status: DC

## 2022-11-23 MED ORDER — OXYCODONE HCL 5 MG PO TABS: 5.0000 mg | ORAL_TABLET | ORAL | 0 refills | Status: AC | PRN

## 2022-11-30 ENCOUNTER — Telehealth: Payer: Self-pay | Admitting: Orthopedic Surgery

## 2022-11-30 NOTE — Addendum Note (Signed)
Addended by: Willia Craze on: 11/30/2022 04:13 PM   Modules accepted: Orders

## 2022-11-30 NOTE — Telephone Encounter (Signed)
Orthopedic Spine Surgery Note  Patient initially did well after surgery, but after a weekend of emesis and retching, her pain returned. Her pain has been severe ever since it started. She rates the pain as a 7/10 at its best and a 10/10 at times. She is using gabapentin and narcotics and it is still significant. She has had several falls as a result of the pain radiating into her left leg. Given her falls and risk of further injury as result, ordered an MRI to evaluate for recurrent disc herniation. Her pain is in a similar distribution as before surgery so it seems likely that it is the same disc and level that is causing her symptoms.   London Sheer, MD Orthopedic Surgeon

## 2022-12-01 ENCOUNTER — Telehealth (HOSPITAL_BASED_OUTPATIENT_CLINIC_OR_DEPARTMENT_OTHER): Payer: Self-pay

## 2022-12-02 MED ORDER — OXYCODONE HCL 5 MG PO TABS: 5.0000 mg | ORAL_TABLET | ORAL | 0 refills | Status: DC | PRN

## 2022-12-02 NOTE — Addendum Note (Signed)
Addended by: Willia Craze on: 12/02/2022 02:48 PM   Modules accepted: Orders

## 2022-12-02 NOTE — Addendum Note (Signed)
Addended by: Willia Craze on: 12/02/2022 10:25 AM   Modules accepted: Orders

## 2022-12-03 MED ORDER — OXYCODONE HCL 5 MG PO TABS: 5.0000 mg | ORAL_TABLET | ORAL | 0 refills | Status: AC | PRN

## 2022-12-03 NOTE — Addendum Note (Signed)
Addended by: Willia Craze on: 12/03/2022 08:50 AM   Modules accepted: Orders

## 2022-12-07 ENCOUNTER — Ambulatory Visit: Payer: BC Managed Care – PPO | Admitting: Gastroenterology

## 2022-12-08 ENCOUNTER — Ambulatory Visit (HOSPITAL_BASED_OUTPATIENT_CLINIC_OR_DEPARTMENT_OTHER)
Admission: RE | Admit: 2022-12-08 | Discharge: 2022-12-08 | Disposition: A | Discharge status: Home / Self Care | Payer: BC Managed Care – PPO | Source: Ambulatory Visit | Attending: Orthopedic Surgery | Admitting: Orthopedic Surgery

## 2022-12-08 DIAGNOSIS — M5416 Radiculopathy, lumbar region: Secondary | ICD-10-CM | POA: Diagnosis not present

## 2022-12-08 DIAGNOSIS — M5116 Intervertebral disc disorders with radiculopathy, lumbar region: Secondary | ICD-10-CM | POA: Diagnosis not present

## 2022-12-08 DIAGNOSIS — M4726 Other spondylosis with radiculopathy, lumbar region: Secondary | ICD-10-CM | POA: Diagnosis not present

## 2022-12-08 DIAGNOSIS — M48061 Spinal stenosis, lumbar region without neurogenic claudication: Secondary | ICD-10-CM | POA: Diagnosis not present

## 2022-12-09 ENCOUNTER — Other Ambulatory Visit: Payer: Self-pay | Admitting: Medical Genetics

## 2022-12-09 DIAGNOSIS — Z006 Encounter for examination for normal comparison and control in clinical research program: Secondary | ICD-10-CM

## 2022-12-12 ENCOUNTER — Emergency Department (HOSPITAL_BASED_OUTPATIENT_CLINIC_OR_DEPARTMENT_OTHER): Payer: BC Managed Care – PPO

## 2022-12-12 ENCOUNTER — Emergency Department (HOSPITAL_BASED_OUTPATIENT_CLINIC_OR_DEPARTMENT_OTHER)
Admission: EM | Admit: 2022-12-12 | Discharge: 2022-12-12 | Disposition: A | Discharge status: Home / Self Care | Payer: BC Managed Care – PPO | Attending: Emergency Medicine | Admitting: Emergency Medicine

## 2022-12-12 ENCOUNTER — Other Ambulatory Visit: Payer: Self-pay

## 2022-12-12 ENCOUNTER — Encounter (HOSPITAL_BASED_OUTPATIENT_CLINIC_OR_DEPARTMENT_OTHER): Payer: Self-pay | Admitting: Emergency Medicine

## 2022-12-12 DIAGNOSIS — Z7901 Long term (current) use of anticoagulants: Secondary | ICD-10-CM | POA: Insufficient documentation

## 2022-12-12 DIAGNOSIS — R0602 Shortness of breath: Secondary | ICD-10-CM | POA: Diagnosis not present

## 2022-12-12 DIAGNOSIS — R0789 Other chest pain: Secondary | ICD-10-CM | POA: Diagnosis not present

## 2022-12-12 DIAGNOSIS — R079 Chest pain, unspecified: Secondary | ICD-10-CM | POA: Diagnosis not present

## 2022-12-12 DIAGNOSIS — R002 Palpitations: Secondary | ICD-10-CM | POA: Insufficient documentation

## 2022-12-12 DIAGNOSIS — R000 Tachycardia, unspecified: Secondary | ICD-10-CM | POA: Diagnosis not present

## 2022-12-12 LAB — BASIC METABOLIC PANEL
Anion gap: 9 (ref 5–15)
BUN: 14 mg/dL (ref 6–20)
CO2: 26 mmol/L (ref 22–32)
Calcium: 8.9 mg/dL (ref 8.9–10.3)
Chloride: 105 mmol/L (ref 98–111)
Creatinine, Ser: 0.64 mg/dL (ref 0.44–1.00)
GFR, Estimated: >60 mL/min (ref >60)
Glucose, Bld: 80 mg/dL (ref 70–99)
Potassium: 4.1 mmol/L (ref 3.5–5.1)
Sodium: 140 mmol/L (ref 135–145)

## 2022-12-12 LAB — CBC
HCT: 36.2 % (ref 36.0–46.0)
Hemoglobin: 11.7 g/dL — ABNORMAL LOW (ref 12.0–15.0)
MCH: 29.3 pg (ref 26.0–34.0)
MCHC: 32.3 g/dL (ref 30.0–36.0)
MCV: 90.5 fL (ref 80.0–100.0)
Platelets: 263 10*3/uL (ref 150–400)
RBC: 4 MIL/uL (ref 3.87–5.11)
RDW: 13.2 % (ref 11.5–15.5)
WBC: 5.5 10*3/uL (ref 4.0–10.5)
nRBC: 0 % (ref 0.0–0.2)

## 2022-12-12 LAB — TROPONIN I (HIGH SENSITIVITY): Troponin I (High Sensitivity): 3 ng/L (ref <18)

## 2022-12-12 LAB — MAGNESIUM: Magnesium: 2.1 mg/dL (ref 1.7–2.4)

## 2022-12-12 MED ORDER — ONDANSETRON HCL 4 MG/2ML IJ SOLN
4.0000 mg | Freq: Once | INTRAMUSCULAR | Status: AC | PRN
  Administered 2022-12-12: 4 mg via INTRAVENOUS
  Filled 2022-12-12: qty 2

## 2022-12-12 MED ORDER — SODIUM CHLORIDE 0.9 % IV BOLUS
1000.0000 mL | Freq: Once | INTRAVENOUS | Status: AC
  Administered 2022-12-12: 1000 mL via INTRAVENOUS

## 2022-12-12 MED ORDER — IOHEXOL 350 MG/ML SOLN
75.0000 mL | Freq: Once | INTRAVENOUS | Status: AC | PRN
  Administered 2022-12-12: 75 mL via INTRAVENOUS

## 2022-12-12 MED ORDER — ALBUTEROL SULFATE HFA 108 (90 BASE) MCG/ACT IN AERS: 2.0000 | INHALATION_SPRAY | RESPIRATORY_TRACT | Status: DC | PRN

## 2022-12-12 MED ORDER — MORPHINE SULFATE (PF) 4 MG/ML IV SOLN
4.0000 mg | Freq: Once | INTRAVENOUS | Status: AC
  Administered 2022-12-12: 4 mg via INTRAVENOUS
  Filled 2022-12-12: qty 1

## 2022-12-12 NOTE — ED Triage Notes (Signed)
Patient arrived via POV c/o tachycardia w/ shortness of breath x 1 day. Patient states rapid heart rate on apple watch, states rates between 130 to 170. Patient states "feeling like something is on my chest". Patient states hx of clotting disorder w/ associated PE in last 2 years. Recent back sx. Patient is AO x 4, VS w/ tachycardia, normal gait.

## 2022-12-12 NOTE — Discharge Instructions (Signed)
As we discussed you may want to follow-up with your cardiologist to see if they would recommend you wear another heart monitor due to concern for possible abnormal heart rhythms intermittently.  Your workup today was very reassuring, no additional treatment needed at this time.

## 2022-12-12 NOTE — ED Provider Notes (Signed)
[place] [facility] AT [facility] Provider Note   CSN: [id number] Arrival date & time: 12/12/22  4098     History  Chief Complaint  Patient presents with   Shortness of Breath   Tachycardia    Kim Pham is a 42 y.o. female with past medical history significant for lupus, Chiari malformation, Raynaud's, patient endorses blood clotting disorder, previous PE, she recently underwent back surgery, and reports that she missed 4 doses of her Eliquis.  Patient reports that she has been having some elevated heart rate, shortness of breath for 1 day.  She notes that she had a rapid heart rate on Apple Watch between 1 30-1 70.  She feels some left-sided chest pressure that she rates 7 out of 10 at this time reports it was benign earlier.  Lastly with she denies any recent fever, chills, or other upper respiratory infectious symptoms.  She does endorse decreased activity since surgery.  She denies previous heart racing sensation like this.   Shortness of Breath      Home Medications Prior to Admission medications   Medication Sig Start Date End Date Taking? Authorizing Provider  apixaban (ELIQUIS) 5 MG TABS tablet Take 1 tablet (5 mg total) by mouth 2 (two) times daily. 09/19/22   Josph Macho, MD  atorvastatin (LIPITOR) 20 MG tablet Take 1 tablet (20 mg total) by mouth daily. Patient not taking: Reported on 10/22/2022 07/27/22   Sandford Craze, NP  famotidine (PEPCID) 20 MG tablet Take 20 mg by mouth at bedtime. 06/14/22   [provider]  gabapentin (NEURONTIN) 300 MG capsule Take 1 capsule (300 mg total) by mouth 3 (three) times daily. 11/23/22 02/21/23  London Sheer, MD  levothyroxine (SYNTHROID) 50 MCG tablet Take 1 tablet (50 mcg total) by mouth daily before breakfast. 11/11/22   Sandford Craze, NP  liothyronine (CYTOMEL) 5 MCG tablet Take 10 mcg by mouth in the morning.    [provider]  Melatonin 10 MG TABS Take 20 mg by mouth at  bedtime.    [provider]  methylPREDNISolone (MEDROL DOSEPAK) 4 MG TBPK tablet Take as prescribed on box 11/15/22   London Sheer, MD  Multiple Vitamin (MULTIVITAMIN WITH MINERALS) TABS tablet Take 1 tablet by mouth daily.    [provider]  ondansetron (ZOFRAN-ODT) 4 MG disintegrating tablet Take 1 tablet (4 mg total) by mouth every 8 (eight) hours as needed. 08/08/22   Mardene Sayer, MD  pantoprazole (PROTONIX) 40 MG tablet Take 40 mg by mouth 2 (two) times daily.    [provider]  promethazine (PHENERGAN) 25 MG tablet Take 1 tablet (25 mg total) by mouth every 8 (eight) hours as needed for nausea or vomiting. 09/17/22   Sandford Craze, NP  sertraline (ZOLOFT) 50 MG tablet TAKE 1 TABLET BY MOUTH DAILY Patient taking differently: Take 50 mg by mouth at bedtime. 08/22/22   Sandford Craze, NP  sucralfate (CARAFATE) 1 g tablet Take 1 tablet (1 g total) by mouth 4 (four) times daily -  with meals and at bedtime. Patient not taking: Reported on 10/22/2022 09/14/22   Sandford Craze, NP  SUMAtriptan (IMITREX) 100 MG tablet Take 1 tablet (100 mg total) by mouth at onset of migraine. May repeat in 2 hours if headache persists or recurs. Do not take more than 2 doses in 24 hours. 06/29/17   Anson Fret, MD  traZODone (DESYREL) 100 MG tablet TAKE TWO TABLETS BY MOUTH EVERY  NIGHT AT BEDTIME 10/08/22   Sandford Craze, NP      Allergies    Naltrexone, Rosuvastatin, Tape, Tramadol, Effexor [venlafaxine], Norco [hydrocodone-acetaminophen], and Voltaren [diclofenac]    Review of Systems   Review of Systems  Respiratory:  Positive for chest tightness and shortness of breath.   All other systems reviewed and are negative.   Physical Exam Updated Vital Signs BP 102/66   Pulse 61   Temp 98.1 F (36.7 C) (Oral)   Resp 18   Ht 5\' 9"  (1.753 m)   Wt 81.6 kg   SpO2 95%   BMI 26.58 kg/m  Physical Exam Vitals and nursing note reviewed.   Constitutional:      General: She is not in acute distress.    Appearance: Normal appearance.  HENT:     Head: Normocephalic and atraumatic.  Eyes:     General:        Right eye: No discharge.        Left eye: No discharge.  Cardiovascular:     Rate and Rhythm: Regular rhythm. Tachycardia present.     Heart sounds: No murmur heard.    No friction rub. No gallop.     Comments: initially mild tachycardia but normal rhythm, return to normal sinus rhythm with regular rate on reevaluation Pulmonary:     Effort: Pulmonary effort is normal.     Breath sounds: Normal breath sounds.  Abdominal:     General: Bowel sounds are normal.     Palpations: Abdomen is soft.  Skin:    General: Skin is warm and dry.     Capillary Refill: Capillary refill takes less than 2 seconds.  Neurological:     Mental Status: She is alert and oriented to person, place, and time.  Psychiatric:        Mood and Affect: Mood normal.        Behavior: Behavior normal.     ED Results / Procedures / Treatments   Labs (all labs ordered are listed, but only abnormal results are displayed) Labs Reviewed  CBC - Abnormal; Notable for the following components:      Result Value   Hemoglobin 11.7 (*)    All other components within normal limits  BASIC METABOLIC PANEL  MAGNESIUM  TROPONIN I (HIGH SENSITIVITY)    EKG EKG Interpretation Date/Time:  Sunday December 12 2022 19:36:56 EDT Ventricular Rate:  104 PR Interval:  151 QRS Duration:  81 QT Interval:  340 QTC Calculation: 448 R Axis:   70  Text Interpretation: Sinus tachycardia Low voltage, precordial leads no significant change since May 2024 Confirmed by Pricilla Loveless [phone] on 12/12/2022 7:42:40 PM  Radiology CT Angio Chest PE W and/or Wo Contrast  Result Date: 12/12/2022 CLINICAL DATA:  Tachycardia and shortness of breath. EXAM: CT ANGIOGRAPHY CHEST WITH CONTRAST TECHNIQUE: Multidetector CT imaging of the chest was performed using the standard  protocol during bolus administration of intravenous contrast. Multiplanar CT image reconstructions and MIPs were obtained to evaluate the vascular anatomy. RADIATION DOSE REDUCTION: This exam was performed according to the departmental dose-optimization program which includes automated exposure control, adjustment of the mA and/or kV according to patient size and/or use of iterative reconstruction technique. CONTRAST:  75mL OMNIPAQUE IOHEXOL 350 MG/ML SOLN COMPARISON:  June 23, 2022 FINDINGS: Cardiovascular: Thoracic aorta is normal in appearance. Satisfactory opacification of the pulmonary arteries to the segmental level. No evidence of pulmonary embolism. Normal heart size. No pericardial effusion. Mediastinum/Nodes: No enlarged mediastinal, hilar,  or axillary lymph nodes. Thyroid gland, trachea, and esophagus demonstrate no significant findings. Lungs/Pleura: Lungs are clear. No pleural effusion or pneumothorax. Upper Abdomen: Multiple surgical clips are seen within the gallbladder fossa. Musculoskeletal: No chest wall abnormality. No acute or significant osseous findings. Review of the MIP images confirms the above findings. IMPRESSION: 1. No evidence of pulmonary embolism or acute cardiopulmonary disease. 2. Evidence of prior cholecystectomy. Electronically Signed   By: Aram Candela M.D.   On: 12/12/2022 22:38   DG Chest 2 View  Result Date: 12/12/2022 CLINICAL DATA:  Shortness of breath. EXAM: CHEST - 2 VIEW COMPARISON:  Most recent radiograph 10/22/2022 FINDINGS: The cardiomediastinal contours are normal. The lungs are clear. Pulmonary vasculature is normal. No consolidation, pleural effusion, or pneumothorax. No acute osseous abnormalities are seen. IMPRESSION: Negative radiographs of the chest. Electronically Signed   By: Narda Rutherford M.D.   On: 12/12/2022 20:21    Procedures Procedures    Medications Ordered in ED Medications  albuterol (VENTOLIN HFA) 108 (90 Base) MCG/ACT inhaler 2  puff (has no administration in time range)  morphine (PF) 4 MG/ML injection 4 mg (4 mg Intravenous Given 12/12/22 2100)  sodium chloride 0.9 % bolus 1,000 mL (0 mLs Intravenous Stopped 12/12/22 2312)  ondansetron (ZOFRAN) injection 4 mg (4 mg Intravenous Given 12/12/22 2059)  iohexol (OMNIPAQUE) 350 MG/ML injection 75 mL (75 mLs Intravenous Contrast Given 12/12/22 2215)    ED Course/ Medical Decision Making/ A&P                                 Medical Decision Making Amount and/or Complexity of Data Reviewed Labs: ordered. Radiology: ordered.  Risk Prescription drug management.   This patient is a 42 y.o. female  who presents to the ED for concern of palpitations, chest pain, shob.   Differential diagnoses prior to evaluation: The emergent differential diagnosis includes, but is not limited to,  ACS, AAS, PE, Mallory-Weiss, Boerhaave's, Pneumonia, acute bronchitis, asthma or COPD exacerbation, anxiety, MSK pain or traumatic injury to the chest, acid reflux versus other, unstable tachydysrhythmia, unstable bradycardia, sick sinus syndrome, heart block or other abnormal electrophysiologic abnormality including WPW, LGL, brugada, SVT, afib, a flutter, vs other . This is not an exhaustive differential.   Past Medical History / Co-morbidities / Social History: lupus, Chiari malformation, Raynaud's, patient endorses blood clotting disorder, previous PE, she recently underwent back surgery, and reports that she missed 4 doses of her Eliquis  Additional history: Chart reviewed. Pertinent results include: Reviewed lab work, imaging from recent admission for back surgery   Physical Exam: Physical exam performed. The pertinent findings include: overall well-appearing, initially with some tachycardia but with normal rhythm.  She has been maintained on cardiac monitor throughout the duration of her visit and has stayed in a normal sinus rhythm for multiple hours.  Lab Tests/Imaging studies: I  personally interpreted labs/imaging and the pertinent results include: CBC with mild anemia, hemoglobin 11.7, BMP overall unremarkable, magnesium normal, troponin negative x 1 in context of atypical presentation of chest pain, overall low clinical suspicion for acute ACS.  I dependently interpreted CT PE study, plain film chest x-ray which shows no evidence of PE or other intrathoracic abnormality I agree with the radiologist interpretation.  Cardiac monitoring: EKG obtained and interpreted by myself and attending physician which shows: Initially with sinus tachycardia, improved to normal sinus rhythm on reevaluation   Medications: I ordered medication including  fluids, for tachycardia, morphine, Zofran for pain, nausea I have reviewed the patients home medicines and have made adjustments as needed.   Disposition: After consideration of the diagnostic results and the patients response to treatment, I feel that patient with no clear cause of her tachycardia on arrival or chest pain, suspect there could be some degree of chest pressure related to the elevated heart rate versus anxiety, possible that there was some unwitnessed tacky arrhythmia prior to patient arriving the emergency department which is since resolved, encourage close follow-up with cardiologist.   emergency department workup does not suggest an emergent condition requiring admission or immediate intervention beyond what has been performed at this time. The plan is: as above. The patient is safe for discharge and has been instructed to return immediately for worsening symptoms, change in symptoms or any other concerns.  Final Clinical Impression(s) / ED Diagnoses Final diagnoses:  Chest pain, unspecified type  Palpitations  Tachycardia    Rx / DC Orders ED Discharge Orders     None         [place] 12/12/22 2341    Pricilla Loveless, MD 12/15/22 1232

## 2022-12-12 NOTE — ED Notes (Signed)
Evette Cristal RN was advised that patient was being transported to imaging.  Returned to rm 7 with no change in condition.

## 2022-12-13 ENCOUNTER — Telehealth: Payer: Self-pay | Admitting: Orthopedic Surgery

## 2022-12-13 ENCOUNTER — Encounter: Payer: Self-pay | Admitting: Orthopedic Surgery

## 2022-12-13 DIAGNOSIS — M5126 Other intervertebral disc displacement, lumbar region: Secondary | ICD-10-CM

## 2022-12-13 MED ORDER — OXYCODONE HCL 5 MG PO TABS
5.0000 mg | ORAL_TABLET | ORAL | 0 refills | Status: DC | PRN
Start: 2022-12-13 — End: 2022-12-13

## 2022-12-13 MED ORDER — OXYCODONE HCL 5 MG PO TABS: 5.0000 mg | ORAL_TABLET | ORAL | 0 refills | Status: AC | PRN

## 2022-12-13 MED ORDER — METHOCARBAMOL 750 MG PO TABS: 750.0000 mg | ORAL_TABLET | Freq: Four times a day (QID) | ORAL | 0 refills | Status: DC | PRN

## 2022-12-13 NOTE — Telephone Encounter (Signed)
Patient called advised the pharmacy need clarification on her Rx before it can be filled. Patient said the pharmacy need a diagnosis code.   The number to contact patient is [phone]

## 2022-12-13 NOTE — Addendum Note (Signed)
Addended by: Willia Craze on: 12/13/2022 02:00 PM   Modules accepted: Orders

## 2022-12-14 ENCOUNTER — Encounter: Payer: Self-pay | Admitting: Radiology

## 2022-12-15 ENCOUNTER — Inpatient Hospital Stay (HOSPITAL_BASED_OUTPATIENT_CLINIC_OR_DEPARTMENT_OTHER): Payer: BC Managed Care – PPO | Admitting: Family

## 2022-12-15 ENCOUNTER — Inpatient Hospital Stay: Payer: BC Managed Care – PPO | Attending: Hematology & Oncology

## 2022-12-15 ENCOUNTER — Encounter: Payer: Self-pay | Admitting: Family

## 2022-12-15 VITALS — BP 106/49 | HR 73 | Temp 98.4°F | Resp 17 | Wt 182.1 lb

## 2022-12-15 DIAGNOSIS — R200 Anesthesia of skin: Secondary | ICD-10-CM | POA: Diagnosis not present

## 2022-12-15 DIAGNOSIS — D6852 Prothrombin gene mutation: Secondary | ICD-10-CM | POA: Diagnosis not present

## 2022-12-15 DIAGNOSIS — R202 Paresthesia of skin: Secondary | ICD-10-CM | POA: Insufficient documentation

## 2022-12-15 DIAGNOSIS — R079 Chest pain, unspecified: Secondary | ICD-10-CM | POA: Diagnosis not present

## 2022-12-15 DIAGNOSIS — K8500 Idiopathic acute pancreatitis without necrosis or infection: Secondary | ICD-10-CM

## 2022-12-15 DIAGNOSIS — Z86711 Personal history of pulmonary embolism: Secondary | ICD-10-CM | POA: Diagnosis not present

## 2022-12-15 DIAGNOSIS — R000 Tachycardia, unspecified: Secondary | ICD-10-CM | POA: Insufficient documentation

## 2022-12-15 DIAGNOSIS — M320 Drug-induced systemic lupus erythematosus: Secondary | ICD-10-CM | POA: Diagnosis not present

## 2022-12-15 DIAGNOSIS — R0602 Shortness of breath: Secondary | ICD-10-CM | POA: Insufficient documentation

## 2022-12-15 DIAGNOSIS — I2782 Chronic pulmonary embolism: Secondary | ICD-10-CM | POA: Diagnosis not present

## 2022-12-15 DIAGNOSIS — Z7901 Long term (current) use of anticoagulants: Secondary | ICD-10-CM | POA: Diagnosis not present

## 2022-12-15 DIAGNOSIS — Z885 Allergy status to narcotic agent status: Secondary | ICD-10-CM | POA: Insufficient documentation

## 2022-12-15 DIAGNOSIS — D509 Iron deficiency anemia, unspecified: Secondary | ICD-10-CM

## 2022-12-15 DIAGNOSIS — I2609 Other pulmonary embolism with acute cor pulmonale: Secondary | ICD-10-CM

## 2022-12-15 LAB — CBC WITH DIFFERENTIAL (CANCER CENTER ONLY)
Abs Immature Granulocytes: 0.01 10*3/uL (ref 0.00–0.07)
Basophils Absolute: 0.1 10*3/uL (ref 0.0–0.1)
Basophils Relative: 1 %
Eosinophils Absolute: 0.2 10*3/uL (ref 0.0–0.5)
Eosinophils Relative: 3 %
HCT: 39.5 % (ref 36.0–46.0)
Hemoglobin: 12.5 g/dL (ref 12.0–15.0)
Immature Granulocytes: 0 %
Lymphocytes Relative: 26 %
Lymphs Abs: 1.9 10*3/uL (ref 0.7–4.0)
MCH: 29.3 pg (ref 26.0–34.0)
MCHC: 31.6 g/dL (ref 30.0–36.0)
MCV: 92.5 fL (ref 80.0–100.0)
Monocytes Absolute: 0.4 10*3/uL (ref 0.1–1.0)
Monocytes Relative: 6 %
Neutro Abs: 4.7 10*3/uL (ref 1.7–7.7)
Neutrophils Relative %: 64 %
Platelet Count: 273 10*3/uL (ref 150–400)
RBC: 4.27 MIL/uL (ref 3.87–5.11)
RDW: 13.3 % (ref 11.5–15.5)
WBC Count: 7.3 10*3/uL (ref 4.0–10.5)
nRBC: 0 % (ref 0.0–0.2)

## 2022-12-15 LAB — CMP (CANCER CENTER ONLY)
ALT: 28 U/L (ref 0–44)
AST: 18 U/L (ref 15–41)
Albumin: 4.3 g/dL (ref 3.5–5.0)
Alkaline Phosphatase: 96 U/L (ref 38–126)
Anion gap: 6 (ref 5–15)
BUN: 10 mg/dL (ref 6–20)
CO2: 30 mmol/L (ref 22–32)
Calcium: 9.9 mg/dL (ref 8.9–10.3)
Chloride: 105 mmol/L (ref 98–111)
Creatinine: 0.79 mg/dL (ref 0.44–1.00)
GFR, Estimated: >60 mL/min (ref >60)
Glucose, Bld: 85 mg/dL (ref 70–99)
Potassium: 4.7 mmol/L (ref 3.5–5.1)
Sodium: 141 mmol/L (ref 135–145)
Total Bilirubin: 0.3 mg/dL (ref 0.3–1.2)
Total Protein: 6.4 g/dL — ABNORMAL LOW (ref 6.5–8.1)

## 2022-12-15 NOTE — Progress Notes (Signed)
Hematology and Oncology Follow Up Visit  Kim Pham [id number] [birth year] 42 y.o. 12/15/2022   Principle Diagnosis:  Acute pulmonary embolism diagnosed on 05/11/2022 - resolved on repeat CTA 06/23/2022 Heterozygous for Prothrombin II gene mutation    Current Therapy:        Eliquis 5 mg PO BID   Interim History:  [name] is here today for follow-up. She is doing fairly well but still having lots of issues with her lower back and numbness and tingling in her right leg causing falls.  She states that now Dr. Christell Constant feels she needs a lumbar fusion but this is on hold due to the anesthesia shortage. She is hoping to be scheduled soon.  No blood loss noted. No bruising or petechiae.  She was in the ED last weekend with tachycardia, chest pain and SOB. She was noted to a mildly low Hgb but work up including CTA was negative.  No fever, chills, n/v, cough, rash, dizziness, abdominal pain or changes in bowel or bladder habits at this time.  She takes a stool softener as needed for constipation.  No swelling in her extremities.  No syncope reported.  Appetite is down and she is doing her best to get more fluids in. Weight is 182 lbs.   ECOG Performance Status: 1 - Symptomatic but completely ambulatory  Medications:  Allergies as of 12/15/2022       Reactions   Naltrexone Nausea Only   Rosuvastatin Nausea Only   Tape Itching, Other (See Comments)   Skin itches if tape is left on for awhile   Tramadol Nausea And Vomiting   Effexor [venlafaxine] Nausea Only, Rash   Norco [hydrocodone-acetaminophen] Rash   Voltaren [diclofenac] Rash        Medication List        Accurate as of December 15, 2022  1:21 PM. If you have any questions, ask your nurse or doctor.          atorvastatin 20 MG tablet Commonly known as: LIPITOR Take 1 tablet (20 mg total) by mouth daily.   Eliquis 5 MG Tabs tablet Generic drug: apixaban Take 1 tablet (5 mg total) by mouth 2 (two) times daily.    famotidine 20 MG tablet Commonly known as: PEPCID Take 20 mg by mouth at bedtime.   gabapentin 300 MG capsule Commonly known as: NEURONTIN Take 1 capsule (300 mg total) by mouth 3 (three) times daily.   levothyroxine 50 MCG tablet Commonly known as: SYNTHROID Take 1 tablet (50 mcg total) by mouth daily before breakfast.   liothyronine 5 MCG tablet Commonly known as: CYTOMEL Take 10 mcg by mouth in the morning.   Melatonin 10 MG Tabs Take 20 mg by mouth at bedtime.   methocarbamol 750 MG tablet Commonly known as: Robaxin-750 Take 1 tablet (750 mg total) by mouth every 6 (six) hours as needed (muscle spasms, pain).   methylPREDNISolone 4 MG Tbpk tablet Commonly known as: MEDROL DOSEPAK Take as prescribed on box   multivitamin with minerals Tabs tablet Take 1 tablet by mouth daily.   ondansetron 4 MG disintegrating tablet Commonly known as: ZOFRAN-ODT Take 1 tablet (4 mg total) by mouth every 8 (eight) hours as needed.   oxyCODONE 5 MG immediate release tablet Commonly known as: Roxicodone Take 1 tablet (5 mg total) by mouth every 4 (four) hours as needed for up to 7 days for severe pain.   pantoprazole 40 MG tablet Commonly known as: PROTONIX Take 40 mg by  mouth 2 (two) times daily.   promethazine 25 MG tablet Commonly known as: PHENERGAN Take 1 tablet (25 mg total) by mouth every 8 (eight) hours as needed for nausea or vomiting.   sertraline 50 MG tablet Commonly known as: ZOLOFT TAKE 1 TABLET BY MOUTH DAILY What changed: when to take this   sucralfate 1 g tablet Commonly known as: Carafate Take 1 tablet (1 g total) by mouth 4 (four) times daily -  with meals and at bedtime.   SUMAtriptan 100 MG tablet Commonly known as: IMITREX Take 1 tablet (100 mg total) by mouth at onset of migraine. May repeat in 2 hours if headache persists or recurs. Do not take more than 2 doses in 24 hours.   traZODone 100 MG tablet Commonly known as: DESYREL TAKE TWO TABLETS BY  MOUTH EVERY NIGHT AT BEDTIME        Allergies:  Allergies  Allergen Reactions   Naltrexone Nausea Only   Rosuvastatin Nausea Only   Tape Itching and Other (See Comments)    Skin itches if tape is left on for awhile   Tramadol Nausea And Vomiting   Effexor [Venlafaxine] Nausea Only and Rash   Norco [Hydrocodone-Acetaminophen] Rash   Voltaren [Diclofenac] Rash    Past Medical History, Surgical history, Social history, and Family History were reviewed and updated.  Review of Systems: All other 10 point review of systems is negative.   Physical Exam:  weight is 182 lb 1.9 oz (82.6 kg). Her oral temperature is 98.4 F (36.9 C). Her blood pressure is 106/49 (abnormal) and her pulse is 73. Her respiration is 17 and oxygen saturation is 100%.   Wt Readings from Last 3 Encounters:  12/15/22 182 lb 1.9 oz (82.6 kg)  12/12/22 180 lb (81.6 kg)  11/08/22 178 lb (80.7 kg)    Ocular: Sclerae unicteric, pupils equal, round and reactive to light Ear-nose-throat: Oropharynx clear, dentition fair Lymphatic: No cervical or supraclavicular adenopathy Lungs no rales or rhonchi, good excursion bilaterally Heart regular rate and rhythm, no murmur appreciated Abd soft, nontender, positive bowel sounds MSK no focal spinal tenderness, no joint edema Neuro: non-focal, well-oriented, appropriate affect Breasts: Deferred   Lab Results  Component Value Date   WBC 7.3 12/15/2022   HGB 12.5 12/15/2022   HCT 39.5 12/15/2022   MCV 92.5 12/15/2022   PLT 273 12/15/2022   Lab Results  Component Value Date   FERRITIN 25.5 11/21/2014   IRON 77 11/21/2014   IRONPCTSAT 19.2 (L) 11/21/2014   Lab Results  Component Value Date   RBC 4.27 12/15/2022   No results found for: "KPAFRELGTCHN", "LAMBDASER", "KAPLAMBRATIO" No results found for: "IGGSERUM", "IGA", "IGMSERUM" No results found for: "TOTALPROTELP", "ALBUMINELP", "A1GS", "A2GS", "BETS", "BETA2SER", "GAMS", "MSPIKE", "SPEI"   Chemistry       Component Value Date/Time   NA 140 12/12/2022 2014   NA 143 01/01/2016 0841   K 4.1 12/12/2022 2014   CL 105 12/12/2022 2014   CO2 26 12/12/2022 2014   BUN 14 12/12/2022 2014   BUN 12 01/01/2016 0841   CREATININE 0.64 12/12/2022 2014   CREATININE 0.70 10/22/2022 1425      Component Value Date/Time   CALCIUM 8.9 12/12/2022 2014   ALKPHOS 58 10/06/2022 1415   AST 25 10/22/2022 1425   AST 14 (L) 10/06/2022 1415   ALT 29 10/22/2022 1425   ALT 15 10/06/2022 1415   BILITOT 0.3 10/22/2022 1425   BILITOT 0.4 10/06/2022 1415  Impression and Plan:  Ms. Deboe is a pleasant 42 yo caucasian female heterozygous for the prothrombin gene mutation with history of PE diagnosed in 04/2022.  She is doing well on Eliquis and will continue her same regimen.  She will let us know  once she scheduled her lumbar fusion surgery.  Follow-up in 3 months.   Eileen Stanford, NP 8/28/20241:21 PM

## 2022-12-21 ENCOUNTER — Other Ambulatory Visit (HOSPITAL_BASED_OUTPATIENT_CLINIC_OR_DEPARTMENT_OTHER): Payer: Self-pay

## 2022-12-21 ENCOUNTER — Telehealth: Payer: Self-pay | Admitting: Orthopedic Surgery

## 2022-12-21 ENCOUNTER — Other Ambulatory Visit: Payer: Self-pay | Admitting: Hematology & Oncology

## 2022-12-21 DIAGNOSIS — I2609 Other pulmonary embolism with acute cor pulmonale: Secondary | ICD-10-CM

## 2022-12-21 MED ORDER — OXYCODONE HCL 5 MG PO CAPS: 5.0000 mg | ORAL_CAPSULE | ORAL | 0 refills | Status: DC | PRN

## 2022-12-21 MED ORDER — OXYCODONE HCL 5 MG PO TABS: 5.0000 mg | ORAL_TABLET | ORAL | 0 refills | Status: DC | PRN

## 2022-12-21 MED ORDER — APIXABAN 5 MG PO TABS
5.0000 mg | ORAL_TABLET | Freq: Two times a day (BID) | ORAL | 2 refills | Status: DC
  Filled 2022-12-21 – 2023-01-09 (×2): qty 60, 30d supply, fill #0
  Filled 2023-02-06: qty 60, 30d supply, fill #1
  Filled 2023-03-06: qty 60, 30d supply, fill #2

## 2022-12-21 NOTE — Addendum Note (Signed)
Addended by: Willia Craze on: 12/21/2022 12:05 PM   Modules accepted: Orders

## 2022-12-21 NOTE — Telephone Encounter (Signed)
Patient called and said that the pharmacy can not fill the prescription because they need a diagnosis code first. [phone]

## 2022-12-21 NOTE — Telephone Encounter (Signed)
This has been sent to Dr. Christell Constant to update

## 2022-12-21 NOTE — Addendum Note (Signed)
Addended by: Willia Craze on: 12/21/2022 11:29 AM   Modules accepted: Orders

## 2022-12-21 NOTE — Addendum Note (Signed)
Addended by: Willia Craze on: 12/21/2022 09:17 AM   Modules accepted: Orders

## 2022-12-22 ENCOUNTER — Ambulatory Visit (INDEPENDENT_AMBULATORY_CARE_PROVIDER_SITE_OTHER): Payer: BC Managed Care – PPO | Admitting: Orthopedic Surgery

## 2022-12-22 DIAGNOSIS — M5126 Other intervertebral disc displacement, lumbar region: Secondary | ICD-10-CM

## 2022-12-22 NOTE — Progress Notes (Addendum)
Orthopedic Surgery Post-operative Office Visit   Procedure: L4/5 right-sided far lateral discectomy Date of Surgery: 11/08/2022 (~6 weeks post-op)   Assessment: Patient is a 42 y.o. who has a recurrent disc herniation at L4/5. Still having significant pain and requiring narcotics. Has had falls as a result of the pain     Plan: -Will continue with symptomatic treatment -Discussed L4/5 far lateral discectomy through transfacet approach, TLIF, and PSIF as a treatment option but explained there are significant drawbacks to a lumbar fusion particularly adjacent segment disease at her age -Oxycodone has been prescribed for pain control -Return to office in 4 weeks, lumbar x-rays needed at next visit: none   ___________________________________________________________________________     Subjective: Patient has been at home. Still having significant pain radiating into her right lower extremity. She chased after her dog recently and had to carry it home. Pain got worse after that event. She feels the pain in her right buttock, lateral thigh, and anterior leg. Not having any pain in her left lower extremity. She feels pain on a daily basis. Has been having falls as a result of the pain. No bowel or bladder incontinence. No saddle anesthesia.     Objective:   General: no acute distress, appropriate affect Neurologic: alert, answering questions appropriately, following commands Respiratory: unlabored breathing on room air Skin: incision is well healed   MSK (spine):   -Strength exam                                                   Left                  Right   EHL                              5/5                  5/5 TA                                 5/5                  5/5 GSC                             5/5                  5/5 Knee extension            5/5                  5/5 Hip flexion                    5/5                  5/5   -Sensory exam                            Sensation intact to light touch in L3-S1 nerve distributions of bilateral lower extremities   Imaging: MRI of the lumbar spine from 12/08/2022 shows a right sided far lateral disc herniation at L4/5  Patient name: Kim Pham Patient MRN: [id number] Date of visit: 12/22/22

## 2022-12-23 ENCOUNTER — Inpatient Hospital Stay (HOSPITAL_COMMUNITY): Payer: BC Managed Care – PPO | Admitting: Anesthesiology

## 2022-12-23 ENCOUNTER — Inpatient Hospital Stay (HOSPITAL_COMMUNITY): Payer: BC Managed Care – PPO

## 2022-12-23 ENCOUNTER — Inpatient Hospital Stay (HOSPITAL_COMMUNITY)
Admission: EM | Disposition: A | Discharge status: Home / Self Care | Payer: Self-pay | Source: Home / Self Care | Attending: Orthopedic Surgery

## 2022-12-23 ENCOUNTER — Encounter (HOSPITAL_COMMUNITY): Payer: Self-pay

## 2022-12-23 ENCOUNTER — Other Ambulatory Visit: Payer: Self-pay

## 2022-12-23 ENCOUNTER — Inpatient Hospital Stay (HOSPITAL_COMMUNITY)
Admission: EM | Admit: 2022-12-23 | Discharge: 2022-12-25 | DRG: 455 | Disposition: A | Discharge status: Home / Self Care | Payer: BC Managed Care – PPO | Source: Ambulatory Visit | Attending: Orthopedic Surgery | Admitting: Orthopedic Surgery

## 2022-12-23 DIAGNOSIS — Z9181 History of falling: Secondary | ICD-10-CM

## 2022-12-23 DIAGNOSIS — R32 Unspecified urinary incontinence: Secondary | ICD-10-CM | POA: Diagnosis not present

## 2022-12-23 DIAGNOSIS — E039 Hypothyroidism, unspecified: Secondary | ICD-10-CM | POA: Diagnosis not present

## 2022-12-23 DIAGNOSIS — M3500 Sicca syndrome, unspecified: Secondary | ICD-10-CM | POA: Diagnosis not present

## 2022-12-23 DIAGNOSIS — E785 Hyperlipidemia, unspecified: Secondary | ICD-10-CM | POA: Diagnosis present

## 2022-12-23 DIAGNOSIS — Z7901 Long term (current) use of anticoagulants: Secondary | ICD-10-CM | POA: Diagnosis not present

## 2022-12-23 DIAGNOSIS — M5116 Intervertebral disc disorders with radiculopathy, lumbar region: Secondary | ICD-10-CM | POA: Diagnosis not present

## 2022-12-23 DIAGNOSIS — Z803 Family history of malignant neoplasm of breast: Secondary | ICD-10-CM

## 2022-12-23 DIAGNOSIS — M545 Low back pain, unspecified: Principal | ICD-10-CM

## 2022-12-23 DIAGNOSIS — M5136 Other intervertebral disc degeneration, lumbar region: Principal | ICD-10-CM | POA: Diagnosis present

## 2022-12-23 DIAGNOSIS — E038 Other specified hypothyroidism: Secondary | ICD-10-CM | POA: Diagnosis not present

## 2022-12-23 DIAGNOSIS — Z9049 Acquired absence of other specified parts of digestive tract: Secondary | ICD-10-CM

## 2022-12-23 DIAGNOSIS — Z888 Allergy status to other drugs, medicaments and biological substances status: Secondary | ICD-10-CM

## 2022-12-23 DIAGNOSIS — G43909 Migraine, unspecified, not intractable, without status migrainosus: Secondary | ICD-10-CM | POA: Diagnosis not present

## 2022-12-23 DIAGNOSIS — I10 Essential (primary) hypertension: Secondary | ICD-10-CM | POA: Diagnosis present

## 2022-12-23 DIAGNOSIS — Z8261 Family history of arthritis: Secondary | ICD-10-CM

## 2022-12-23 DIAGNOSIS — Z86711 Personal history of pulmonary embolism: Secondary | ICD-10-CM

## 2022-12-23 DIAGNOSIS — Z800 Family history of malignant neoplasm of digestive organs: Secondary | ICD-10-CM

## 2022-12-23 DIAGNOSIS — Z886 Allergy status to analgesic agent status: Secondary | ICD-10-CM

## 2022-12-23 DIAGNOSIS — I2510 Atherosclerotic heart disease of native coronary artery without angina pectoris: Secondary | ICD-10-CM | POA: Diagnosis not present

## 2022-12-23 DIAGNOSIS — G4700 Insomnia, unspecified: Secondary | ICD-10-CM | POA: Diagnosis not present

## 2022-12-23 DIAGNOSIS — Z8616 Personal history of COVID-19: Secondary | ICD-10-CM

## 2022-12-23 DIAGNOSIS — Z87891 Personal history of nicotine dependence: Secondary | ICD-10-CM | POA: Diagnosis not present

## 2022-12-23 DIAGNOSIS — Z90710 Acquired absence of both cervix and uterus: Secondary | ICD-10-CM

## 2022-12-23 DIAGNOSIS — Z91048 Other nonmedicinal substance allergy status: Secondary | ICD-10-CM

## 2022-12-23 DIAGNOSIS — Z823 Family history of stroke: Secondary | ICD-10-CM | POA: Diagnosis not present

## 2022-12-23 DIAGNOSIS — Z9889 Other specified postprocedural states: Secondary | ICD-10-CM | POA: Diagnosis not present

## 2022-12-23 DIAGNOSIS — Z79899 Other long term (current) drug therapy: Secondary | ICD-10-CM

## 2022-12-23 DIAGNOSIS — Z86718 Personal history of other venous thrombosis and embolism: Secondary | ICD-10-CM

## 2022-12-23 DIAGNOSIS — F419 Anxiety disorder, unspecified: Secondary | ICD-10-CM | POA: Diagnosis present

## 2022-12-23 DIAGNOSIS — Z8669 Personal history of other diseases of the nervous system and sense organs: Secondary | ICD-10-CM

## 2022-12-23 DIAGNOSIS — M5126 Other intervertebral disc displacement, lumbar region: Secondary | ICD-10-CM | POA: Diagnosis present

## 2022-12-23 DIAGNOSIS — Z8379 Family history of other diseases of the digestive system: Secondary | ICD-10-CM

## 2022-12-23 DIAGNOSIS — Z8249 Family history of ischemic heart disease and other diseases of the circulatory system: Secondary | ICD-10-CM

## 2022-12-23 DIAGNOSIS — Z981 Arthrodesis status: Secondary | ICD-10-CM | POA: Diagnosis not present

## 2022-12-23 DIAGNOSIS — Z83719 Family history of colon polyps, unspecified: Secondary | ICD-10-CM

## 2022-12-23 DIAGNOSIS — K219 Gastro-esophageal reflux disease without esophagitis: Secondary | ICD-10-CM | POA: Diagnosis present

## 2022-12-23 DIAGNOSIS — R296 Repeated falls: Secondary | ICD-10-CM | POA: Diagnosis present

## 2022-12-23 DIAGNOSIS — F32A Depression, unspecified: Secondary | ICD-10-CM | POA: Diagnosis not present

## 2022-12-23 DIAGNOSIS — E02 Subclinical iodine-deficiency hypothyroidism: Secondary | ICD-10-CM | POA: Diagnosis not present

## 2022-12-23 DIAGNOSIS — M329 Systemic lupus erythematosus, unspecified: Secondary | ICD-10-CM | POA: Diagnosis present

## 2022-12-23 DIAGNOSIS — Z79890 Hormone replacement therapy (postmenopausal): Secondary | ICD-10-CM | POA: Diagnosis not present

## 2022-12-23 DIAGNOSIS — I7300 Raynaud's syndrome without gangrene: Secondary | ICD-10-CM | POA: Diagnosis present

## 2022-12-23 DIAGNOSIS — Z885 Allergy status to narcotic agent status: Secondary | ICD-10-CM

## 2022-12-23 DIAGNOSIS — M549 Dorsalgia, unspecified: Secondary | ICD-10-CM | POA: Diagnosis present

## 2022-12-23 LAB — ABO/RH: ABO/RH(D): A POS

## 2022-12-23 LAB — COMPREHENSIVE METABOLIC PANEL
ALT: 29 U/L (ref 0–44)
AST: 25 U/L (ref 15–41)
Albumin: 3.6 g/dL (ref 3.5–5.0)
Alkaline Phosphatase: 87 U/L (ref 38–126)
Anion gap: 9 (ref 5–15)
BUN: 11 mg/dL (ref 6–20)
CO2: 24 mmol/L (ref 22–32)
Calcium: 9 mg/dL (ref 8.9–10.3)
Chloride: 105 mmol/L (ref 98–111)
Creatinine, Ser: 0.77 mg/dL (ref 0.44–1.00)
GFR, Estimated: >60 mL/min (ref >60)
Glucose, Bld: 80 mg/dL (ref 70–99)
Potassium: 3.8 mmol/L (ref 3.5–5.1)
Sodium: 138 mmol/L (ref 135–145)
Total Bilirubin: 0.5 mg/dL (ref 0.3–1.2)
Total Protein: 6.2 g/dL — ABNORMAL LOW (ref 6.5–8.1)

## 2022-12-23 LAB — CBC WITH DIFFERENTIAL/PLATELET
Abs Immature Granulocytes: 0.03 10*3/uL (ref 0.00–0.07)
Basophils Absolute: 0.1 10*3/uL (ref 0.0–0.1)
Basophils Relative: 1 %
Eosinophils Absolute: 0.2 10*3/uL (ref 0.0–0.5)
Eosinophils Relative: 2 %
HCT: 38.9 % (ref 36.0–46.0)
Hemoglobin: 12.4 g/dL (ref 12.0–15.0)
Immature Granulocytes: 0 %
Lymphocytes Relative: 24 %
Lymphs Abs: 2 10*3/uL (ref 0.7–4.0)
MCH: 30 pg (ref 26.0–34.0)
MCHC: 31.9 g/dL (ref 30.0–36.0)
MCV: 94 fL (ref 80.0–100.0)
Monocytes Absolute: 0.4 10*3/uL (ref 0.1–1.0)
Monocytes Relative: 5 %
Neutro Abs: 5.6 10*3/uL (ref 1.7–7.7)
Neutrophils Relative %: 68 %
Platelets: 257 10*3/uL (ref 150–400)
RBC: 4.14 MIL/uL (ref 3.87–5.11)
RDW: 13.2 % (ref 11.5–15.5)
WBC: 8.3 10*3/uL (ref 4.0–10.5)
nRBC: 0 % (ref 0.0–0.2)

## 2022-12-23 LAB — SURGICAL PCR SCREEN
MRSA, PCR: NEGATIVE
Staphylococcus aureus: NEGATIVE

## 2022-12-23 LAB — TYPE AND SCREEN
ABO/RH(D): A POS
Antibody Screen: NEGATIVE

## 2022-12-23 SURGERY — POSTERIOR LUMBAR FUSION 1 LEVEL
Anesthesia: General

## 2022-12-23 MED ORDER — CHLORHEXIDINE GLUCONATE 0.12 % MT SOLN: 15.0000 mL | Freq: Once | OROMUCOSAL | Status: DC

## 2022-12-23 MED ORDER — ONDANSETRON HCL 4 MG/2ML IJ SOLN
INTRAMUSCULAR | Status: AC
  Filled 2022-12-23: qty 2

## 2022-12-23 MED ORDER — CEFAZOLIN SODIUM 1 G IJ SOLR
INTRAMUSCULAR | Status: AC
  Filled 2022-12-23: qty 20

## 2022-12-23 MED ORDER — PHENYLEPHRINE HCL (PRESSORS) 10 MG/ML IV SOLN
INTRAVENOUS | Status: AC
  Filled 2022-12-23: qty 1

## 2022-12-23 MED ORDER — PROPOFOL 10 MG/ML IV BOLUS
INTRAVENOUS | Status: AC
  Filled 2022-12-23: qty 20

## 2022-12-23 MED ORDER — PHENYLEPHRINE 80 MCG/ML (10ML) SYRINGE FOR IV PUSH (FOR BLOOD PRESSURE SUPPORT)
PREFILLED_SYRINGE | INTRAVENOUS | Status: AC
  Filled 2022-12-23: qty 20

## 2022-12-23 MED ORDER — ORAL CARE MOUTH RINSE: 15.0000 mL | Freq: Once | OROMUCOSAL | Status: DC

## 2022-12-23 MED ORDER — TRANEXAMIC ACID-NACL 1000-0.7 MG/100ML-% IV SOLN
1000.0000 mg | INTRAVENOUS | Status: AC
  Administered 2022-12-23: 1000 mg via INTRAVENOUS
  Filled 2022-12-23: qty 100

## 2022-12-23 MED ORDER — PHENYLEPHRINE HCL-NACL 20-0.9 MG/250ML-% IV SOLN
INTRAVENOUS | Status: DC | PRN
  Administered 2022-12-23: 20 ug/min via INTRAVENOUS

## 2022-12-23 MED ORDER — CHLORHEXIDINE GLUCONATE 0.12 % MT SOLN
OROMUCOSAL | Status: AC
  Filled 2022-12-23: qty 15

## 2022-12-23 MED ORDER — PHENYLEPHRINE 80 MCG/ML (10ML) SYRINGE FOR IV PUSH (FOR BLOOD PRESSURE SUPPORT)
PREFILLED_SYRINGE | INTRAVENOUS | Status: DC | PRN
  Administered 2022-12-23: 80 ug via INTRAVENOUS

## 2022-12-23 MED ORDER — ENOXAPARIN SODIUM 40 MG/0.4ML IJ SOSY: 40.0000 mg | PREFILLED_SYRINGE | INTRAMUSCULAR | Status: DC

## 2022-12-23 MED ORDER — ONDANSETRON HCL 4 MG/2ML IJ SOLN: 4.0000 mg | Freq: Four times a day (QID) | INTRAMUSCULAR | Status: DC | PRN

## 2022-12-23 MED ORDER — ROCURONIUM BROMIDE 10 MG/ML (PF) SYRINGE
PREFILLED_SYRINGE | INTRAVENOUS | Status: DC | PRN
  Administered 2022-12-23: 30 mg via INTRAVENOUS

## 2022-12-23 MED ORDER — EPHEDRINE SULFATE-NACL 50-0.9 MG/10ML-% IV SOSY
PREFILLED_SYRINGE | INTRAVENOUS | Status: DC | PRN
  Administered 2022-12-23: 10 mg via INTRAVENOUS

## 2022-12-23 MED ORDER — 0.9 % SODIUM CHLORIDE (POUR BTL) OPTIME
TOPICAL | Status: DC | PRN
  Administered 2022-12-23: 1000 mL

## 2022-12-23 MED ORDER — MIDAZOLAM HCL 2 MG/2ML IJ SOLN
INTRAMUSCULAR | Status: DC | PRN
  Administered 2022-12-23: 2 mg via INTRAVENOUS

## 2022-12-23 MED ORDER — MIDAZOLAM HCL 2 MG/2ML IJ SOLN
INTRAMUSCULAR | Status: AC
  Filled 2022-12-23: qty 2

## 2022-12-23 MED ORDER — CEFAZOLIN SODIUM-DEXTROSE 2-4 GM/100ML-% IV SOLN
2.0000 g | INTRAVENOUS | Status: AC
  Administered 2022-12-23 (×2): 2 g via INTRAVENOUS
  Filled 2022-12-23: qty 100

## 2022-12-23 MED ORDER — METHOCARBAMOL 1000 MG/10ML IJ SOLN: 500.0000 mg | Freq: Four times a day (QID) | INTRAVENOUS | Status: DC | PRN

## 2022-12-23 MED ORDER — MORPHINE SULFATE (PF) 4 MG/ML IV SOLN
4.0000 mg | Freq: Once | INTRAVENOUS | Status: AC
  Administered 2022-12-23: 4 mg via INTRAVENOUS
  Filled 2022-12-23: qty 1

## 2022-12-23 MED ORDER — ALBUMIN HUMAN 5 % IV SOLN
INTRAVENOUS | Status: DC | PRN
Start: 2022-12-23 — End: 2022-12-24

## 2022-12-23 MED ORDER — DEXAMETHASONE SODIUM PHOSPHATE 10 MG/ML IJ SOLN
INTRAMUSCULAR | Status: AC
  Filled 2022-12-23: qty 1

## 2022-12-23 MED ORDER — SUFENTANIL CITRATE 50 MCG/ML IV SOLN
1.0000 ug/kg/h | Freq: Once | INTRAVENOUS | Status: DC
  Filled 2022-12-23 (×2): qty 1

## 2022-12-23 MED ORDER — THROMBIN 20000 UNITS EX SOLR: CUTANEOUS | Status: DC | PRN

## 2022-12-23 MED ORDER — METHOCARBAMOL 500 MG PO TABS: 500.0000 mg | ORAL_TABLET | Freq: Four times a day (QID) | ORAL | Status: DC | PRN

## 2022-12-23 MED ORDER — ONDANSETRON HCL 4 MG PO TABS: 4.0000 mg | ORAL_TABLET | Freq: Four times a day (QID) | ORAL | Status: DC | PRN

## 2022-12-23 MED ORDER — OXYCODONE HCL 5 MG/5ML PO SOLN: 5.0000 mg | Freq: Once | ORAL | Status: DC | PRN

## 2022-12-23 MED ORDER — FENTANYL CITRATE (PF) 250 MCG/5ML IJ SOLN
INTRAMUSCULAR | Status: AC
  Filled 2022-12-23: qty 5

## 2022-12-23 MED ORDER — LACTATED RINGERS IV SOLN: INTRAVENOUS | Status: DC

## 2022-12-23 MED ORDER — PROPOFOL 500 MG/50ML IV EMUL
INTRAVENOUS | Status: DC | PRN
Start: 2022-12-23 — End: 2022-12-24
  Administered 2022-12-23: 100 ug/kg/min via INTRAVENOUS
  Administered 2022-12-23: 50 ug/kg/min via INTRAVENOUS

## 2022-12-23 MED ORDER — LACTATED RINGERS IV SOLN: INTRAVENOUS | Status: DC | PRN

## 2022-12-23 MED ORDER — ROCURONIUM BROMIDE 10 MG/ML (PF) SYRINGE
PREFILLED_SYRINGE | INTRAVENOUS | Status: AC
  Filled 2022-12-23: qty 20

## 2022-12-23 MED ORDER — ONDANSETRON HCL 4 MG/2ML IJ SOLN
4.0000 mg | Freq: Once | INTRAMUSCULAR | Status: AC
  Administered 2022-12-23: 4 mg via INTRAVENOUS
  Filled 2022-12-23: qty 2

## 2022-12-23 MED ORDER — VANCOMYCIN HCL 1000 MG IV SOLR
INTRAVENOUS | Status: AC
  Filled 2022-12-23: qty 20

## 2022-12-23 MED ORDER — FENTANYL CITRATE (PF) 250 MCG/5ML IJ SOLN
INTRAMUSCULAR | Status: DC | PRN
  Administered 2022-12-23: 50 ug via INTRAVENOUS
  Administered 2022-12-23: 100 ug via INTRAVENOUS

## 2022-12-23 MED ORDER — PROPOFOL 10 MG/ML IV BOLUS
INTRAVENOUS | Status: DC | PRN
  Administered 2022-12-23: 170 mg via INTRAVENOUS

## 2022-12-23 MED ORDER — METHYLPREDNISOLONE ACETATE 40 MG/ML IJ SUSP
INTRAMUSCULAR | Status: AC
  Filled 2022-12-23: qty 1

## 2022-12-23 MED ORDER — DEXAMETHASONE SODIUM PHOSPHATE 10 MG/ML IJ SOLN
INTRAMUSCULAR | Status: DC | PRN
  Administered 2022-12-23: 10 mg via INTRAVENOUS

## 2022-12-23 MED ORDER — LIDOCAINE 2% (20 MG/ML) 5 ML SYRINGE
INTRAMUSCULAR | Status: DC | PRN
  Administered 2022-12-23: 100 mg via INTRAVENOUS

## 2022-12-23 MED ORDER — GLYCOPYRROLATE 0.2 MG/ML IJ SOLN
INTRAMUSCULAR | Status: DC | PRN
Start: 2022-12-23 — End: 2022-12-24
  Administered 2022-12-23: .2 mg via INTRAVENOUS

## 2022-12-23 MED ORDER — THROMBIN 20000 UNITS EX KIT
PACK | CUTANEOUS | Status: AC
  Filled 2022-12-23: qty 1

## 2022-12-23 MED ORDER — BUPIVACAINE-EPINEPHRINE (PF) 0.25% -1:200000 IJ SOLN
INTRAMUSCULAR | Status: AC
  Filled 2022-12-23: qty 30

## 2022-12-23 MED ORDER — ATROPINE SULFATE 0.4 MG/ML IV SOLN
INTRAVENOUS | Status: DC | PRN
  Administered 2022-12-23: .1 mg via INTRAVENOUS

## 2022-12-23 MED ORDER — SCOPOLAMINE 1 MG/3DAYS TD PT72
MEDICATED_PATCH | TRANSDERMAL | Status: DC | PRN
  Administered 2022-12-23: 1 via TRANSDERMAL

## 2022-12-23 MED ORDER — MORPHINE SULFATE (PF) 2 MG/ML IV SOLN
2.0000 mg | INTRAVENOUS | Status: DC | PRN
  Administered 2022-12-23: 2 mg via INTRAVENOUS
  Filled 2022-12-23: qty 1

## 2022-12-23 MED ORDER — OXYCODONE HCL 5 MG PO TABS: 5.0000 mg | ORAL_TABLET | Freq: Once | ORAL | Status: DC | PRN

## 2022-12-23 MED ORDER — FENTANYL CITRATE (PF) 100 MCG/2ML IJ SOLN
25.0000 ug | INTRAMUSCULAR | Status: DC | PRN
  Administered 2022-12-24: 50 ug via INTRAVENOUS

## 2022-12-23 MED ORDER — OXYCODONE HCL 5 MG PO TABS: 5.0000 mg | ORAL_TABLET | ORAL | Status: DC | PRN

## 2022-12-23 MED ORDER — ONDANSETRON HCL 4 MG/2ML IJ SOLN
INTRAMUSCULAR | Status: DC | PRN
Start: 2022-12-23 — End: 2022-12-24
  Administered 2022-12-23: 4 mg via INTRAVENOUS

## 2022-12-23 MED ORDER — SUCCINYLCHOLINE CHLORIDE 200 MG/10ML IV SOSY
PREFILLED_SYRINGE | INTRAVENOUS | Status: DC | PRN
Start: 2022-12-23 — End: 2022-12-24
  Administered 2022-12-23: 100 mg via INTRAVENOUS

## 2022-12-23 MED ORDER — EPHEDRINE 5 MG/ML INJ
INTRAVENOUS | Status: AC
  Filled 2022-12-23: qty 5

## 2022-12-23 MED ORDER — SUFENTANIL CITRATE 50 MCG/ML IV SOLN
INTRAVENOUS | Status: DC | PRN
  Administered 2022-12-23: .2 ug/kg/h via INTRAVENOUS

## 2022-12-23 SURGICAL SUPPLY — 77 items
AGENT HMST KT MTR STRL THRMB (HEMOSTASIS) ×1
APL SKNCLS STERI-STRIP NONHPOA (GAUZE/BANDAGES/DRESSINGS) ×1
BENZOIN TINCTURE PRP APPL 2/3 (GAUZE/BANDAGES/DRESSINGS) ×2 IMPLANT
BLADE BONE MILL FINE (MISCELLANEOUS) ×1
BLADE MILL BN FN STRL DISP (MISCELLANEOUS) IMPLANT
BONE FIBERS PLIAFX 10 (Bone Implant) ×1 IMPLANT
BUR NEURO DRILL SOFT 3.0X3.8M (BURR) ×2 IMPLANT
CAGE SABLE 10X26 8D (Cage) IMPLANT
CANISTER SUCT 3000ML PPV (MISCELLANEOUS) ×2 IMPLANT
CLIP NEUROVISION LG (NEUROSURGERY SUPPLIES) IMPLANT
CLSR STERI-STRIP ANTIMIC 1/2X4 (GAUZE/BANDAGES/DRESSINGS) ×2 IMPLANT
CORD BIPOLAR FORCEPS 12FT (ELECTRODE) IMPLANT
COVER BACK TABLE 80X110 HD (DRAPES) IMPLANT
COVER SURGICAL LIGHT HANDLE (MISCELLANEOUS) ×2 IMPLANT
DRAIN HEMOVAC 7FR (DRAIN) ×4 IMPLANT
DRAPE C-ARM 42X72 X-RAY (DRAPES) ×2 IMPLANT
DRAPE MICROSCOPE LEICA 54X105 (DRAPES) ×2 IMPLANT
DRAPE SURG 17X23 STRL (DRAPES) ×8 IMPLANT
DRAPE UTILITY XL STRL (DRAPES) ×4 IMPLANT
DRESSING MEPILEX FLEX 4X4 (GAUZE/BANDAGES/DRESSINGS) ×2 IMPLANT
DRSG MEPILEX FLEX 4X4 (GAUZE/BANDAGES/DRESSINGS)
DRSG MEPILEX POST OP 4X8 (GAUZE/BANDAGES/DRESSINGS) IMPLANT
DRSG TEGADERM 4X10 (GAUZE/BANDAGES/DRESSINGS) ×2 IMPLANT
DRSG TEGADERM 4X4.75 (GAUZE/BANDAGES/DRESSINGS) IMPLANT
DURAPREP 26ML APPLICATOR (WOUND CARE) ×2 IMPLANT
ELECT COATED BLADE 2.86 ST (ELECTRODE) ×2 IMPLANT
ELECT NVM5 SURFACE MEP/EMG (ELECTRODE) IMPLANT
ELECT PENCIL ROCKER SW 15FT (MISCELLANEOUS) ×2 IMPLANT
ELECT REM PT RETURN 9FT ADLT (ELECTROSURGICAL) ×1
ELECTRODE REM PT RTRN 9FT ADLT (ELECTROSURGICAL) ×2 IMPLANT
EVACUATOR 1/8 PVC DRAIN (DRAIN) IMPLANT
GAUZE SPONGE 4X4 12PLY STRL (GAUZE/BANDAGES/DRESSINGS) ×2 IMPLANT
GLOVE BIO SURGEON STRL SZ7.5 (GLOVE) ×2 IMPLANT
GLOVE BIOGEL PI IND STRL 7.5 (GLOVE) ×2 IMPLANT
GLOVE INDICATOR 8.0 STRL GRN (GLOVE) ×2 IMPLANT
GLOVE SS BIOGEL STRL SZ 7.5 (GLOVE) IMPLANT
GOWN STRL REUS W/ TWL XL LVL3 (GOWN DISPOSABLE) ×2 IMPLANT
GOWN STRL REUS W/TWL XL LVL3 (GOWN DISPOSABLE) ×1
GOWN STRL SURGICAL XL XLNG (GOWN DISPOSABLE) IMPLANT
GRAFT BNE FBR PLIAFX PRIME 10 (Bone Implant) IMPLANT
KIT BASIN OR (CUSTOM PROCEDURE TRAY) ×2 IMPLANT
KIT POSITION SURG JACKSON T1 (MISCELLANEOUS) ×2 IMPLANT
KIT TURNOVER KIT B (KITS) ×2 IMPLANT
MODULE EMG NDL SSEP NVM5 (NEUROSURGERY SUPPLIES) IMPLANT
MODULE EMG NEEDLE SSEP NVM5 (NEUROSURGERY SUPPLIES) ×1 IMPLANT
NDL 22X1.5 STRL (OR ONLY) (MISCELLANEOUS) ×2 IMPLANT
NEEDLE 22X1.5 STRL (OR ONLY) (MISCELLANEOUS) IMPLANT
NS IRRIG 1000ML POUR BTL (IV SOLUTION) ×2 IMPLANT
PACK LAMINECTOMY ORTHO (CUSTOM PROCEDURE TRAY) ×2 IMPLANT
PATTIES SURGICAL .5 X.5 (GAUZE/BANDAGES/DRESSINGS) IMPLANT
PROBE BALL TIP NVM5 SNG USE (NEUROSURGERY SUPPLIES) IMPLANT
PUTTY DBM INSTAFILL CART 5CC (Putty) IMPLANT
ROD RELINE LORDOTIC 5.5X45 (Rod) IMPLANT
SCREW LOCK RELINE 5.5 TULIP (Screw) IMPLANT
SCREW RELINE-O POLY 7.5X45 (Screw) IMPLANT
SCREW RELINE-O POLY 7.5X50 (Screw) ×3 IMPLANT
SCREW RLINE PLY 2S 50X7.5XPA (Screw) IMPLANT
SPONGE SURGIFOAM ABS GEL 100 (HEMOSTASIS) ×2 IMPLANT
SPONGE T-LAP 4X18 ~~LOC~~+RFID (SPONGE) ×2 IMPLANT
SUCTION TUBE FRAZIER 10FR DISP (SUCTIONS) ×2 IMPLANT
SURGIFLO W/THROMBIN 8M KIT (HEMOSTASIS) IMPLANT
SUT BONE WAX W31G (SUTURE) ×2 IMPLANT
SUT ETHILON 2 0 FS 18 (SUTURE) IMPLANT
SUT MNCRL AB 3-0 PS2 27 (SUTURE) IMPLANT
SUT VIC AB 0 CT1 18XCR BRD8 (SUTURE) ×2 IMPLANT
SUT VIC AB 0 CT1 8-18 (SUTURE) ×1
SUT VIC AB 2-0 CT1 18 (SUTURE) ×2 IMPLANT
SUT VIC AB 3-0 PS2 18 (SUTURE) ×1
SUT VIC AB 3-0 PS2 18XBRD (SUTURE) ×2 IMPLANT
SYR BULB IRRIG 60ML STRL (SYRINGE) ×2 IMPLANT
SYR CONTROL 10ML LL (SYRINGE) ×2 IMPLANT
TIP CONICAL INSTAFILL (ORTHOPEDIC DISPOSABLE SUPPLIES) IMPLANT
TOWEL GREEN STERILE (TOWEL DISPOSABLE) ×2 IMPLANT
TOWEL GREEN STERILE FF (TOWEL DISPOSABLE) ×2 IMPLANT
TRAY FOLEY W/BAG SLVR 14FR (SET/KITS/TRAYS/PACK) IMPLANT
TUBING FEATHERFLOW (TUBING) ×2 IMPLANT
WATER STERILE IRR 1000ML POUR (IV SOLUTION) ×2 IMPLANT

## 2022-12-23 NOTE — H&P (Signed)
Orthopedic Spine Surgery H&P Note  Assessment: Patient is a 42 y.o. female with low back and that radiates into the right lower extremity.  Has a recurrent L4/5 far lateral disc herniation   Plan: -Given that pain is uncontrolled even with opioids and she has had several falls, discussed operative management in the form of L4/5 trans facet far lateral discectomy.  Due to the destabilizing nature of removing the entire facet, recommended TLIF and PSIF -Written consent verified -Hold anticoagulation in anticipation of surgery -Ancef and TXA on all to OR -NPO for procedure -Site marked -To OR when ready  The patient has symptoms consistent with lumbar radiculopathy due to a recurrent disc herniation.  Her pain has been uncontrolled even with narcotic pain medication.  She has also had several falls as a result of the pain and her leg giving way.  Since there is scar tissue from her prior surgery in the intertransverse plane, discussed a trans facet approach to identify the exiting nerve root and perform the discectomy.  I told her as a result of the removal of the facet, that would destabilize the spine.  Accordingly, recommended including a TLIF and PSIF.  The risks including but not limited to dural tear, nerve root injury, paralysis, persistent pain, pseudarthrosis, infection, bleeding, hardware failure, cage backing out, adjacent segment disease, DVT/PE, heart attack, death, fracture, blindness, and need for additional procedures were discussed with the patient. The benefit of the surgery would be improvement her radiating leg pain. I explained that back pain relief is not the goal of the surgery and it is not reliably alleviated with this surgery. The alternatives to surgical management were covered with the patient and included continued monitoring, physical therapy, over-the-counter pain medications, ambulatory aids, repeat injections, and activity modification. All the patient's questions were  answered to her satisfaction. After this discussion, the patient expressed understanding and elected to proceed with surgical intervention.     ___________________________________________________________________________  Chief Complaint: radiating right leg pain  History: Patient is 42 y.o. female who had a right-sided far lateral disc herniation at L4-5 that underwent discectomy about 6 weeks ago.  She was doing well after surgery but then had a weekend where she was retching and developed recurrence of her right leg pain.  She has had pain since it recurred.  Pain is felt in the lower back rating into the right lower extremity along the lateral aspect of the thigh and leg.  Pain is severe in nature.  She has had falls as a result of the pain.  She is currently taking oxycodone every 4 hours for the pain and has not been able to decrease it because the pain has remained severe.  She was seen in the office yesterday by myself and we discussed possible operative management in the future if pain remained uncontrolled.  She came to the ER today because pain has been intractable and is not relieved even with opioids.  She has had falls as a result of the pain.  She is not having any left lower extremity symptoms.  Has been off of her eliquis for 48hrs  Review of systems: General: denies fevers and chills, myalgias Neurologic: denies recent changes in vision, slurred speech Abdomen: denies nausea, vomiting, hematemesis Respiratory: denies cough, shortness of breath  Past medical history: History of DVT/PE Anxiety/Depression Constipation Migraines GERD HLD Endometriosis Insomnia Sicca syndrome Lupus   Allergies: naltrexone, rosuvastatin, adhesive tape, tramadol, Effexor, Norco, Voltaren   Past surgical history:  Cholecystectomy Hysterectomy  Diagnostic laparoscopy Oophorectomy Right rotator cuff repair Basal skull decompression L45/5 far lateral discectomy   Social history: Denies  use of nicotine product (smoking, vaping, patches, smokeless) Alcohol use: denies Denies recreational drug use   Family history: -reviewed and not pertinent to recurrent far lateral disc herniation   Physical Exam:  BMI of 26.9  General: no acute distress, appears stated age Neurologic: alert, answering questions appropriately, following commands Cardiovascular: regular rate, no cyanosis Respiratory: unlabored breathing on room air, symmetric chest rise Psychiatric: appropriate affect, normal cadence to speech   MSK (spine):  -Strength exam      Left  Right  EHL    5/5  5/5 TA    5/5  5/5 GSC    5/5  5/5 Knee extension  5/5  5/5 Knee flexion   5/5  5/5 Hip flexion   5/5  5/5  -Sensory exam    Sensation intact to light touch in L3-S1 nerve distributions of bilateral lower extremities   Patient name: Kim Pham Patient MRN: [id number] Date: 12/23/22

## 2022-12-23 NOTE — ED Triage Notes (Signed)
Reports had lumbar surgery 6 weeks ago and is now having severe pain and bladder incontinence and went to surgeon had a repeat MRI and it has reherniated and told to come here to surgery admission.  Patient reports she has been falling down a lot.

## 2022-12-23 NOTE — ED Provider Notes (Signed)
[place] EMERGENCY DEPARTMENT AT [facility] Provider Note  CSN: [id number] Arrival date & time: 12/23/22 1056  Chief Complaint(s) Back Pain  HPI Kim Pham is a 42 y.o. female who presents emergency room for evaluation of back pain, urinary incontinence and an abnormal MRI.  Patient had an L4-L5 lateral discectomy on 11/08/2022 by Dr. Christell Constant and followed up with him yesterday.  An MRI was obtained that reportedly showed a reherniation of her previous herniated lumbar disc and she was instructed to come to the emergency department for orthopedic surgery admission.  Patient endorses frequent falls and intermittent urinary incontinence but denies numbness of lower extremities and states that her legs are weak because she is in pain.  Denies abdominal pain, nausea, vomiting, headache, fever or other systemic symptoms.   Past Medical History Past Medical History:  Diagnosis Date   Acute pulmonary embolism without acute cor pulmonale (HCC) 05/17/2022   ADHD (attention deficit hyperactivity disorder)    Adnexal mass 02/24/2021   in epic seen on [place]   Amenorrhea 08/14/2021   Anxiety 08/14/2021   Arthralgia 08/18/2016   Back pain 09/04/2021   Blood in stool 09/08/2021   Cardiac murmur 12/16/2021   Chest discomfort 04/20/2022   Cholecystitis 05/26/2022   Chondromalacia, patella 06/30/2021   Chronic calculous cholecystitis 06/08/2022   Chronic constipation 08/14/2021   Chronic headaches HISTORY OF MIGRAINES 08/14/2021   Chronic migraine without aura, with intractable migraine, so stated, with status migrainosus 07/27/2017   Complication of anesthesia 08/14/2021   PONV   Constipation 06/11/2021   Coronary artery calcification seen on CT scan 12/16/2021   Cyst of left ovary 06/17/2021   Degenerative disc disease, lumbar 09/04/2019   Depression with anxiety 03/26/2016   Diarrhea 07/08/2017   DOE (dyspnea on exertion) 04/20/2022   Effusion, left knee 12/16/2021    Endometriosis    Family history of BRCA gene mutation 06/17/2021   [employer] genetic testing done 06/2021 - negative for breast cancer genes.   Family history of breast cancer 09/09/2015   Formatting of this note might be different from the original.  Mom dx'd age 62  Mat aunt dx'd age 55  Neither relative tested for BRCA  Formatting of this note might be different from the original.  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 (HCC)    High risk medication use 11/22/2016   history of Chiari malformation    History of Chiari malformation 11/22/2016   history of COVID 02/2021   LOW GRADE FEVER CONGESTION AND HEADACHE ALL SYMPTOMS RESOLVED   Hot flashes 06/11/2021   Hyperlipidemia    Hypertension    Insomnia 08/19/2020   Low blood pressure    pt states history of low blood pressure-fainted 12/2010-instructed to move slowly   Nipple discharge 05/19/2022   Nonepileptic episode (HCC) 12/22/2016   Numbness and tingling 02/25/2016   Obesity (BMI 30.0-34.9) 12/16/2021   On continuous oral anticoagulation 05/17/2022   OSTEOARTHRITIS 08/14/2021   Other epilepsy, intractable, without status epilepticus (HCC) 11/01/2016   Pain in right knee 11/13/2020   Pain in unspecified joint 08/18/2016   Palpitations 12/15/2021   Pelvic pain in female 08/18/2021   Photosensitivity 11/22/2016   PONV (postoperative nausea and vomiting)    Preventative health care 09/08/2021   Psychogenic nonepileptic  seizure 07/27/2017   Pulmonary embolism (HCC)    Raynaud's disease without gangrene 11/22/2016   Rectal bleeding 06/11/2021   RUQ pain 05/07/2022   Sacroiliac joint dysfunction of right side 12/19/2019   Sicca syndrome (HCC) 11/22/2016   SLE (systemic lupus erythematosus) (HCC) 03/26/2016   Status post laparoscopic  cholecystectomy 06/08/2022   Subclinical hypothyroidism    Thyroid nodule    Transient alteration of awareness 01/01/2016   Vasomotor symptoms due to menopause 06/17/2021   Voice hoarseness 05/07/2022   Wears glasses for reading 08/14/2021   Patient Active Problem List   Diagnosis Date Noted   Lumbar disc herniation 11/08/2022   Pre-op evaluation 10/22/2022   Acute pancreatitis 09/14/2022   Nausea 09/09/2022   Abdominal pain 09/09/2022   Migraines 07/27/2022   Alcohol abuse 06/25/2022   Status post laparoscopic cholecystectomy 06/08/2022   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 (HCC) 05/27/2022   On continuous oral anticoagulation 05/17/2022   Pulmonary embolism (HCC) 05/11/2022   RUQ pain 05/07/2022   Voice hoarseness 05/07/2022   Thyroid nodule 05/04/2022   Former smoker 04/20/2022   Subclinical hypothyroidism 12/18/2021   Effusion, left knee 12/16/2021   Endometriosis 12/16/2021   Cardiac murmur 12/16/2021   Obesity (BMI 30.0-34.9) 12/16/2021   Coronary artery calcification seen on CT scan 12/16/2021   Preventative health care 09/08/2021   Hyperlipidemia 09/08/2021   Amenorrhea 08/14/2021   Anxiety 08/14/2021   Chronic constipation 08/14/2021   Chronic headaches HISTORY OF MIGRAINES 08/14/2021   Complication of anesthesia 08/14/2021   HISTORY OF MIGRAINES 08/14/2021   OSTEOARTHRITIS 08/14/2021   Wears glasses for reading 08/14/2021   Chondromalacia, patella 06/30/2021   Family history of BRCA gene mutation 06/17/2021   Cyst of left ovary 06/17/2021   Vasomotor symptoms due to menopause 06/17/2021   Rectal bleeding 06/11/2021   Hot flashes 06/11/2021   Insomnia 08/19/2020   Gastroesophageal reflux disease 08/19/2020   Sacroiliac joint dysfunction of right side 12/19/2019   Degenerative disc disease, lumbar 09/04/2019   Diarrhea 07/08/2017   Psychogenic nonepileptic seizure 12/22/2016   Sicca syndrome (HCC) 11/22/2016   Raynaud's disease without gangrene  11/22/2016   History of Chiari malformation 11/22/2016   High risk medication use 11/22/2016   Other epilepsy, intractable, without status epilepticus (HCC) 11/01/2016   Arthralgia 08/18/2016   SLE (systemic lupus erythematosus) (HCC) 03/26/2016   Depression with anxiety 03/26/2016   Transient alteration of awareness 01/01/2016   Chiari malformation 12/19/2015   Family history of breast cancer 09/09/2015   Home Medication(s) Prior to Admission medications   Medication Sig Start Date End Date Taking? Authorizing Provider  apixaban (ELIQUIS) 5 MG TABS tablet Take 1 tablet (5 mg total) by mouth 2 (two) times daily. 12/21/22   Josph Macho, MD  atorvastatin (LIPITOR) 20 MG tablet Take 1 tablet (20 mg total) by mouth daily. Patient not taking: Reported on 10/22/2022 07/27/22   Sandford Craze, NP  famotidine (PEPCID) 20 MG tablet Take 20 mg by mouth at bedtime. 06/14/22   [provider]  gabapentin (NEURONTIN) 300 MG capsule Take 1 capsule (300 mg total) by mouth 3 (three) times daily. 11/23/22 02/21/23  London Sheer, MD  levothyroxine (SYNTHROID) 50 MCG tablet Take 1 tablet (50 mcg total) by mouth daily before breakfast. 11/11/22   Sandford Craze, NP  liothyronine (CYTOMEL) 5 MCG tablet Take 10 mcg by mouth in the morning.    [provider]  Melatonin 10 MG TABS Take 20 mg by mouth at bedtime.  [provider]  methocarbamol (ROBAXIN-750) 750 MG tablet Take 1 tablet (750 mg total) by mouth every 6 (six) hours as needed (muscle spasms, pain). 12/13/22 01/12/23  London Sheer, MD  methylPREDNISolone (MEDROL DOSEPAK) 4 MG TBPK tablet Take as prescribed on box Patient not taking: Reported on 12/15/2022 11/15/22   London Sheer, MD  Multiple Vitamin (MULTIVITAMIN WITH MINERALS) TABS tablet Take 1 tablet by mouth daily.    [provider]  ondansetron (ZOFRAN-ODT) 4 MG disintegrating tablet Take 1 tablet (4 mg total) by mouth every 8 (eight) hours as  needed. 08/08/22   Mardene Sayer, MD  oxyCODONE (ROXICODONE) 5 MG immediate release tablet Take 1 tablet (5 mg total) by mouth every 4 (four) hours as needed for up to 5 days for severe pain. 12/21/22 12/26/22  London Sheer, MD  pantoprazole (PROTONIX) 40 MG tablet Take 40 mg by mouth 2 (two) times daily.    [provider]  promethazine (PHENERGAN) 25 MG tablet Take 1 tablet (25 mg total) by mouth every 8 (eight) hours as needed for nausea or vomiting. 09/17/22   Sandford Craze, NP  sertraline (ZOLOFT) 50 MG tablet TAKE 1 TABLET BY MOUTH DAILY Patient taking differently: Take 50 mg by mouth at bedtime. 08/22/22   Sandford Craze, NP  sucralfate (CARAFATE) 1 g tablet Take 1 tablet (1 g total) by mouth 4 (four) times daily -  with meals and at bedtime. Patient not taking: Reported on 10/22/2022 09/14/22   Sandford Craze, NP  SUMAtriptan (IMITREX) 100 MG tablet Take 1 tablet (100 mg total) by mouth at onset of migraine. May repeat in 2 hours if headache persists or recurs. Do not take more than 2 doses in 24 hours. 06/29/17   Anson Fret, MD  traZODone (DESYREL) 100 MG tablet TAKE TWO TABLETS BY MOUTH EVERY NIGHT AT BEDTIME 10/08/22   Sandford Craze, NP                                                                                                                                    Past Surgical History Past Surgical History:  Procedure Laterality Date   ABDOMINAL HYSTERECTOMY  2007   CHOLECYSTECTOMY N/A 06/08/2022   Procedure: LAPAROSCOPIC CHOLECYSTECTOMY WITH ICG;  Surgeon: Almond Lint, MD;  Location: [facility];  Service: General;  Laterality: N/A;   CYSTOSCOPY  2007   WITH HYSTERECTOMY   DIAGNOSTIC LAPAROSCOPY  2005   LUMBAR LAMINECTOMY/DECOMPRESSION MICRODISCECTOMY Left 11/08/2022   Procedure: L4-5 FAR LEFT LATERAL DISCECTOMY;  Surgeon: London Sheer, MD;  Location: [facility];  Service: Orthopedics;  Laterality: Left;   OOPHORECTOMY Left 08/18/2021   ROTATOR CUFF  REPAIR Right 2010   SALPINGOOPHORECTOMY  04/16/2011   Procedure: SALPINGO OOPHERECTOMY;  Surgeon: Lenoard Aden, MD;  Location: [facility];  Service: Gynecology;  Laterality: Right;   skull decompression surgery 2005     @ [facility]  Family History Family History  Problem Relation Age of Onset   Hypertension Mother    CAD Mother    Breast cancer Mother    Migraines Mother    Gout Mother    Heart disease Mother    Crohn's disease Mother    Hypertension Father    CAD Father    CVA Father    Heart disease Father    Colon polyps Father    Liver disease Father    Sjogren's syndrome Sister    Celiac disease Sister    Rheum arthritis Maternal Grandmother    Dementia Maternal Grandmother    Pancreatic cancer Maternal Grandfather    CAD Paternal Grandmother    Macular degeneration Paternal Grandmother    CVA Paternal Grandfather    Autism Son    ADD / ADHD Son    Breast cancer Maternal Aunt    Esophageal cancer Neg Hx    Colon cancer Neg Hx    Stomach cancer Neg Hx     Social History Social History   Tobacco Use   Smoking status: Former    Current packs/day: 0.00    Average packs/day: 1 pack/day for 10.0 years (10.0 ttl pk-yrs)    Types: Cigarettes    Start date: 04/04/1994    Quit date: 04/04/2004    Years since quitting: 18.7    Passive exposure: Past   Smokeless tobacco: Never  Vaping Use   Vaping status: Never Used  Substance Use Topics   Alcohol use: Not Currently   Drug use: Never   Allergies Naltrexone, Rosuvastatin, Tape, Tramadol, Effexor [venlafaxine], Norco [hydrocodone-acetaminophen], and Voltaren [diclofenac]  Review of Systems Review of Systems  Musculoskeletal:  Positive for back pain.  Neurological:  Positive for weakness.    Physical Exam Vital Signs  I have reviewed the triage vital signs BP 110/75 (BP Location: Right Arm)   Pulse 87   Temp 97.6 F (36.4 C)   Resp 16   Ht 5\' 9"  (1.753 m)   Wt 82.6 kg   SpO2 100%   BMI 26.88 kg/m    Physical Exam Vitals and nursing note reviewed.  Constitutional:      General: She is not in acute distress.    Appearance: She is well-developed.  HENT:     Head: Normocephalic and atraumatic.  Eyes:     Conjunctiva/sclera: Conjunctivae normal.  Cardiovascular:     Rate and Rhythm: Normal rate and regular rhythm.     Heart sounds: No murmur heard. Pulmonary:     Effort: Pulmonary effort is normal. No respiratory distress.     Breath sounds: Normal breath sounds.  Abdominal:     Palpations: Abdomen is soft.     Tenderness: There is no abdominal tenderness.  Musculoskeletal:        General: Tenderness present. No swelling.     Cervical back: Neck supple.  Skin:    General: Skin is warm and dry.     Capillary Refill: Capillary refill takes less than 2 seconds.  Neurological:     Mental Status: She is alert.  Psychiatric:        Mood and Affect: Mood normal.     ED Results and Treatments Labs (all labs ordered are listed, but only abnormal results are displayed) Labs Reviewed  COMPREHENSIVE METABOLIC PANEL  CBC WITH DIFFERENTIAL/PLATELET  Radiology No results found.  Pertinent labs & imaging results that were available during my care of the patient were reviewed by me and considered in my medical decision making (see MDM for details).  Medications Ordered in ED Medications  morphine (PF) 4 MG/ML injection 4 mg (has no administration in time range)  ondansetron (ZOFRAN) injection 4 mg (has no administration in time range)                                                                                                                                     Procedures Procedures  (including critical care time)  Medical Decision Making / ED Course   This patient presents to the ED for concern of back pain, this involves an extensive number of  treatment options, and is a complaint that carries with it a high risk of complications and morbidity.  The differential diagnosis includes reherniation, cauda equina, foraminal stenosis, musculoskeletal pain  MDM: Patient seen emergency room for evaluation of back pain.  Physical exam with right-sided weakness of the lower extremity secondary to pain, no sensory deficit.  Tenderness in the L-spine.  Laboratory evaluation unremarkable.  Orthopedics consulted and I spoke with Earney Hamburg who came to evaluate the patient and patient will be admitted to the orthopedic service.  Patient then admitted.   Additional history obtained:  -External records from outside source obtained and reviewed including: Chart review including previous notes, labs, imaging, consultation notes   Lab Tests: -I ordered, reviewed, and interpreted labs.   The pertinent results include:   Labs Reviewed  COMPREHENSIVE METABOLIC PANEL  CBC WITH DIFFERENTIAL/PLATELET    Medicines ordered and prescription drug management: Meds ordered this encounter  Medications   morphine (PF) 4 MG/ML injection 4 mg   ondansetron (ZOFRAN) injection 4 mg    -I have reviewed the patients home medicines and have made adjustments as needed  Critical interventions none  Consultations Obtained: I requested consultation with the orthopedic service,  and discussed lab and imaging findings as well as pertinent plan - they recommend: Admission to orthopedics   Cardiac Monitoring: The patient was maintained on a cardiac monitor.  I personally viewed and interpreted the cardiac monitored which showed an underlying rhythm of: NSR  Social Determinants of Health:  Factors impacting patients care include: none   Reevaluation: After the interventions noted above, I reevaluated the patient and found that they have :improved  Co morbidities that complicate the patient evaluation  Past Medical History:  Diagnosis Date   Acute  pulmonary embolism without acute cor pulmonale (HCC) 05/17/2022   ADHD (attention deficit hyperactivity disorder)    Adnexal mass 02/24/2021   in epic seen on [place]   Amenorrhea 08/14/2021   Anxiety 08/14/2021   Arthralgia 08/18/2016   Back pain 09/04/2021   Blood in stool 09/08/2021   Cardiac murmur 12/16/2021   Chest discomfort 04/20/2022   Cholecystitis 05/26/2022  Chondromalacia, patella 06/30/2021   Chronic calculous cholecystitis 06/08/2022   Chronic constipation 08/14/2021   Chronic headaches HISTORY OF MIGRAINES 08/14/2021   Chronic migraine without aura, with intractable migraine, so stated, with status migrainosus 07/27/2017   Complication of anesthesia 08/14/2021   PONV   Constipation 06/11/2021   Coronary artery calcification seen on CT scan 12/16/2021   Cyst of left ovary 06/17/2021   Degenerative disc disease, lumbar 09/04/2019   Depression with anxiety 03/26/2016   Diarrhea 07/08/2017   DOE (dyspnea on exertion) 04/20/2022   Effusion, left knee 12/16/2021   Endometriosis    Family history of BRCA gene mutation 06/17/2021   [employer] genetic testing done 06/2021 - negative for breast cancer genes.   Family history of breast cancer 09/09/2015   Formatting of this note might be different from the original.  Mom dx'd age 9  Mat aunt dx'd age 60  Neither relative tested for BRCA  Formatting of this note might be different from the original.  Mom dx'd age 62  Mat aunt dx'd age 30  Neither relative tested for BRCA   Fatigue 03/26/2016   Former smoker 04/20/2022   Gastroesophageal reflux disease 08/19/2020   Hair loss 11/22/2016   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 (HCC)    High risk medication use 11/22/2016   history of Chiari malformation    History of Chiari malformation 11/22/2016   history of COVID 02/2021   LOW GRADE FEVER CONGESTION AND HEADACHE ALL SYMPTOMS RESOLVED   Hot flashes 06/11/2021   Hyperlipidemia    Hypertension    Insomnia 08/19/2020    Low blood pressure    pt states history of low blood pressure-fainted 12/2010-instructed to move slowly   Nipple discharge 05/19/2022   Nonepileptic episode (HCC) 12/22/2016   Numbness and tingling 02/25/2016   Obesity (BMI 30.0-34.9) 12/16/2021   On continuous oral anticoagulation 05/17/2022   OSTEOARTHRITIS 08/14/2021   Other epilepsy, intractable, without status epilepticus (HCC) 11/01/2016   Pain in right knee 11/13/2020   Pain in unspecified joint 08/18/2016   Palpitations 12/15/2021   Pelvic pain in female 08/18/2021   Photosensitivity 11/22/2016   PONV (postoperative nausea and vomiting)    Preventative health care 09/08/2021   Psychogenic nonepileptic seizure 07/27/2017   Pulmonary embolism (HCC)    Raynaud's disease without gangrene 11/22/2016   Rectal bleeding 06/11/2021   RUQ pain 05/07/2022   Sacroiliac joint dysfunction of right side 12/19/2019   Sicca syndrome (HCC) 11/22/2016   SLE (systemic lupus erythematosus) (HCC) 03/26/2016   Status post laparoscopic cholecystectomy 06/08/2022   Subclinical hypothyroidism    Thyroid nodule    Transient alteration of awareness 01/01/2016   Vasomotor symptoms due to menopause 06/17/2021   Voice hoarseness 05/07/2022   Wears glasses for reading 08/14/2021      Dispostion: I considered admission for this patient, and the patient will be admitted to Ortho     Final Clinical Impression(s) / ED Diagnoses Final diagnoses:  None     @PCDICTATION @    Glendora Score, MD 12/23/22 1725

## 2022-12-23 NOTE — Consult Note (Signed)
Reason for Consult:Lumbar disk herniation Referring Physician: Wyn Forster Kommor Time called: 1142 Time at bedside: 1245   Kim Pham is an 42 y.o. female.  HPI: Kim Pham has a disk herniation at L4-5 dx by MRI last week. She has been having weakness and falls. Over the last couple of days this has gotten worse and she has also had incontinence. She was directed to come to the ED for admission and likely surgical decompression this evening.  Past Medical History:  Diagnosis Date   Acute pulmonary embolism without acute cor pulmonale (HCC) 05/17/2022   ADHD (attention deficit hyperactivity disorder)    Adnexal mass 02/24/2021   in epic seen on [place]   Amenorrhea 08/14/2021   Anxiety 08/14/2021   Arthralgia 08/18/2016   Back pain 09/04/2021   Blood in stool 09/08/2021   Cardiac murmur 12/16/2021   Chest discomfort 04/20/2022   Cholecystitis 05/26/2022   Chondromalacia, patella 06/30/2021   Chronic calculous cholecystitis 06/08/2022   Chronic constipation 08/14/2021   Chronic headaches HISTORY OF MIGRAINES 08/14/2021   Chronic migraine without aura, with intractable migraine, so stated, with status migrainosus 07/27/2017   Complication of anesthesia 08/14/2021   PONV   Constipation 06/11/2021   Coronary artery calcification seen on CT scan 12/16/2021   Cyst of left ovary 06/17/2021   Degenerative disc disease, lumbar 09/04/2019   Depression with anxiety 03/26/2016   Diarrhea 07/08/2017   DOE (dyspnea on exertion) 04/20/2022   Effusion, left knee 12/16/2021   Endometriosis    Family history of BRCA gene mutation 06/17/2021   [facility] genetic testing done 06/2021 - negative for breast cancer genes.   Family history of breast cancer 09/09/2015   Formatting of this note might be different from the original.  Mom dx'd age 21  Mat aunt dx'd age 1  Neither relative tested for BRCA  Formatting of this note might be different from the original.  Mom dx'd age 38  Mat aunt dx'd age 74   Neither relative tested for BRCA   Fatigue 03/26/2016   Former smoker 04/20/2022   Gastroesophageal reflux disease 08/19/2020   Hair loss 11/22/2016   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 (HCC)    High risk medication use 11/22/2016   history of Chiari malformation    History of Chiari malformation 11/22/2016   history of COVID 02/2021   LOW GRADE FEVER CONGESTION AND HEADACHE ALL SYMPTOMS RESOLVED   Hot flashes 06/11/2021   Hyperlipidemia    Hypertension    Insomnia 08/19/2020   Low blood pressure    pt states history of low blood pressure-fainted 12/2010-instructed to move slowly   Nipple discharge 05/19/2022   Nonepileptic episode (HCC) 12/22/2016   Numbness and tingling 02/25/2016   Obesity (BMI 30.0-34.9) 12/16/2021   On continuous oral anticoagulation 05/17/2022   OSTEOARTHRITIS 08/14/2021   Other epilepsy, intractable, without status epilepticus (HCC) 11/01/2016   Pain in right knee 11/13/2020   Pain in unspecified joint 08/18/2016   Palpitations 12/15/2021   Pelvic pain in female 08/18/2021   Photosensitivity 11/22/2016   PONV (postoperative nausea and vomiting)    Preventative health care 09/08/2021   Psychogenic nonepileptic seizure 07/27/2017   Pulmonary embolism (HCC)    Raynaud's disease without gangrene 11/22/2016   Rectal bleeding 06/11/2021   RUQ pain 05/07/2022   Sacroiliac joint dysfunction of right side 12/19/2019   Sicca syndrome (HCC) 11/22/2016   SLE (systemic lupus erythematosus) (HCC) 03/26/2016   Status post laparoscopic cholecystectomy 06/08/2022   Subclinical hypothyroidism  Thyroid nodule    Transient alteration of awareness 01/01/2016   Vasomotor symptoms due to menopause 06/17/2021   Voice hoarseness 05/07/2022   Wears glasses for reading 08/14/2021    Past Surgical History:  Procedure Laterality Date   ABDOMINAL HYSTERECTOMY  2007   CHOLECYSTECTOMY N/A 06/08/2022   Procedure: LAPAROSCOPIC CHOLECYSTECTOMY WITH ICG;  Surgeon:  Almond Lint, MD;  Location: [facility];  Service: General;  Laterality: N/A;   CYSTOSCOPY  2007   WITH HYSTERECTOMY   DIAGNOSTIC LAPAROSCOPY  2005   LUMBAR LAMINECTOMY/DECOMPRESSION MICRODISCECTOMY Left 11/08/2022   Procedure: L4-5 FAR LEFT LATERAL DISCECTOMY;  Surgeon: London Sheer, MD;  Location: [facility];  Service: Orthopedics;  Laterality: Left;   OOPHORECTOMY Left 08/18/2021   ROTATOR CUFF REPAIR Right 2010   SALPINGOOPHORECTOMY  04/16/2011   Procedure: SALPINGO OOPHERECTOMY;  Surgeon: Lenoard Aden, MD;  Location: [facility];  Service: Gynecology;  Laterality: Right;   skull decompression surgery 2005     @ [facility]    Family History  Problem Relation Age of Onset   Hypertension Mother    CAD Mother    Breast cancer Mother    Migraines Mother    Gout Mother    Heart disease Mother    Crohn's disease Mother    Hypertension Father    CAD Father    CVA Father    Heart disease Father    Colon polyps Father    Liver disease Father    Sjogren's syndrome Sister    Celiac disease Sister    Rheum arthritis Maternal Grandmother    Dementia Maternal Grandmother    Pancreatic cancer Maternal Grandfather    CAD Paternal Grandmother    Macular degeneration Paternal Grandmother    CVA Paternal Grandfather    Autism Son    ADD / ADHD Son    Breast cancer Maternal Aunt    Esophageal cancer Neg Hx    Colon cancer Neg Hx    Stomach cancer Neg Hx     Social History:  reports that she quit smoking about 18 years ago. Her smoking use included cigarettes. She started smoking about 28 years ago. She has a 10 pack-year smoking history. She has been exposed to tobacco smoke. She has never used smokeless tobacco. She reports that she does not currently use alcohol. She reports that she does not use drugs.  Allergies:  Allergies  Allergen Reactions   Naltrexone Nausea Only   Rosuvastatin Nausea Only   Tape Itching and Other (See Comments)    Skin itches if tape is left on for awhile    Tramadol Nausea And Vomiting   Effexor [Venlafaxine] Nausea Only and Rash   Norco [Hydrocodone-Acetaminophen] Rash   Voltaren [Diclofenac] Rash    Medications: I have reviewed the patient's current medications.  No results found for this or any previous visit (from the past 48 hour(s)).  No results found.  Review of Systems  HENT:  Negative for ear discharge, ear pain, hearing loss and tinnitus.   Eyes:  Negative for photophobia and pain.  Respiratory:  Negative for cough and shortness of breath.   Cardiovascular:  Negative for chest pain.  Gastrointestinal:  Negative for abdominal pain, nausea and vomiting.  Genitourinary:  Negative for dysuria, flank pain, frequency and urgency.  Musculoskeletal:  Positive for back pain. Negative for myalgias and neck pain.  Neurological:  Positive for weakness and numbness. Negative for dizziness and headaches.  Hematological:  Does not bruise/bleed easily.  Psychiatric/Behavioral:  The patient is not nervous/anxious.    Blood pressure 92/63, pulse 61, temperature 97.6 F (36.4 C), resp. rate 14, height 5\' 9"  (1.753 m), weight 82.6 kg, SpO2 100%. Physical Exam Constitutional:      General: She is not in acute distress.    Appearance: She is well-developed. She is not diaphoretic.  HENT:     Head: Normocephalic and atraumatic.  Eyes:     General: No scleral icterus.       Right eye: No discharge.        Left eye: No discharge.     Conjunctiva/sclera: Conjunctivae normal.  Cardiovascular:     Rate and Rhythm: Normal rate and regular rhythm.  Pulmonary:     Effort: Pulmonary effort is normal. No respiratory distress.  Musculoskeletal:     Cervical back: Normal range of motion.     Comments: BLE No traumatic wounds, ecchymosis, or rash  Nontender  No knee or ankle effusion  Knee stable to varus/ valgus and anterior/posterior stress  Sens DPN, SPN, TN intact but doesn't feel normal on right  Motor EHL, ext, flex, evers 3/5 right, 4/5  left  DP 1+, PT 1+, No significant edema  Skin:    General: Skin is warm and dry.  Neurological:     Mental Status: She is alert.  Psychiatric:        Mood and Affect: Mood normal.        Behavior: Behavior normal.     Assessment/Plan: L4-5 disk herniation -- Likely decompression by Dr. Christell Constant this evening. Please keep NPO.    Freeman Caldron, PA-C Orthopedic Surgery [phone] 12/23/2022, 12:50 PM

## 2022-12-23 NOTE — Anesthesia Preprocedure Evaluation (Signed)
Anesthesia Evaluation  Patient identified by MRN, date of birth, ID band Patient awake    Reviewed: Allergy & Precautions, H&P , NPO status , Patient's Chart, lab work & pertinent test results  History of Anesthesia Complications (+) PONV and history of anesthetic complications  Airway Mallampati: II   Neck ROM: full    Dental   Pulmonary former smoker   breath sounds clear to auscultation       Cardiovascular hypertension,  Rhythm:regular Rate:Normal     Neuro/Psych  Headaches, Seizures -,  PSYCHIATRIC DISORDERS Anxiety Depression       GI/Hepatic ,GERD  ,,  Endo/Other  Hypothyroidism    Renal/GU      Musculoskeletal  (+) Arthritis ,    Abdominal   Peds  Hematology   Anesthesia Other Findings   Reproductive/Obstetrics                             Anesthesia Physical Anesthesia Plan  ASA: 3  Anesthesia Plan: General   Post-op Pain Management:    Induction: Intravenous  PONV Risk Score and Plan: 4 or greater and Ondansetron, Dexamethasone, Midazolam, Scopolamine patch - Pre-op and Treatment may vary due to age or medical condition  Airway Management Planned: Oral ETT and Video Laryngoscope Planned  Additional Equipment:   Intra-op Plan:   Post-operative Plan: Extubation in OR  Informed Consent: I have reviewed the patients History and Physical, chart, labs and discussed the procedure including the risks, benefits and alternatives for the proposed anesthesia with the patient or authorized representative who has indicated his/her understanding and acceptance.     Dental advisory given  Plan Discussed with: CRNA, Anesthesiologist and Surgeon  Anesthesia Plan Comments:        Anesthesia Quick Evaluation

## 2022-12-23 NOTE — ED Notes (Signed)
ED TO INPATIENT HANDOFF REPORT  ED Nurse Name and Phone #: Nehemiah Settle, RN  [phone]  S Name/Age/Gender Kim Pham 42 y.o. female Room/Bed: 020C/020C  Code Status   Code Status: Full Code  Home/SNF/Other Home Patient oriented to: self, place, time, and situation Is this baseline? Yes   Triage Complete: Triage complete  Chief Complaint Lumbar disc herniation [M51.26]  Triage Note Reports had lumbar surgery 6 weeks ago and is now having severe pain and bladder incontinence and went to surgeon had a repeat MRI and it has reherniated and told to come here to surgery admission.  Patient reports she has been falling down a lot.    Allergies Allergies  Allergen Reactions   Naltrexone Nausea Only   Rosuvastatin Nausea Only   Tape Itching and Other (See Comments)    Skin itches if tape is left on for awhile   Tramadol Nausea And Vomiting   Effexor [Venlafaxine] Nausea Only and Rash   Norco [Hydrocodone-Acetaminophen] Rash   Voltaren [Diclofenac] Rash    Level of Care/Admitting Diagnosis ED Disposition     ED Disposition  Admit   Condition  --   Comment  Hospital Area: [facility] [[id number]]  Level of Care: Med-Surg [16]  May admit patient to [facility] or [facility] if equivalent level of care is available:: No  Covid Evaluation: Asymptomatic - no recent exposure (last 10 days) testing not required  Diagnosis: Lumbar disc herniation [725366]  Admitting Physician: London Sheer [[id number]]  Attending Physician: London Sheer [[id number]]  Bed request comments: 5N  Certification:: I certify this patient will need inpatient services for at least 2 midnights  Expected Medical Readiness: 12/22/2022          B Medical/Surgery History Past Medical History:  Diagnosis Date   Acute pulmonary embolism without acute cor pulmonale (HCC) 05/17/2022   ADHD (attention deficit hyperactivity disorder)    Adnexal mass 02/24/2021   in epic seen on [place]    Amenorrhea 08/14/2021   Anxiety 08/14/2021   Arthralgia 08/18/2016   Back pain 09/04/2021   Blood in stool 09/08/2021   Cardiac murmur 12/16/2021   Chest discomfort 04/20/2022   Cholecystitis 05/26/2022   Chondromalacia, patella 06/30/2021   Chronic calculous cholecystitis 06/08/2022   Chronic constipation 08/14/2021   Chronic headaches HISTORY OF MIGRAINES 08/14/2021   Chronic migraine without aura, with intractable migraine, so stated, with status migrainosus 07/27/2017   Complication of anesthesia 08/14/2021   PONV   Constipation 06/11/2021   Coronary artery calcification seen on CT scan 12/16/2021   Cyst of left ovary 06/17/2021   Degenerative disc disease, lumbar 09/04/2019   Depression with anxiety 03/26/2016   Diarrhea 07/08/2017   DOE (dyspnea on exertion) 04/20/2022   Effusion, left knee 12/16/2021   Endometriosis    Family history of BRCA gene mutation 06/17/2021   [employer] genetic testing done 06/2021 - negative for breast cancer genes.   Family history of breast cancer 09/09/2015   Formatting of this note might be different from the original.  Mom dx'd age 107  Mat aunt dx'd age 71  Neither relative tested for BRCA  Formatting of this note might be different from the original.  Mom dx'd age 51  Mat aunt dx'd age 49  Neither relative tested for BRCA   Fatigue 03/26/2016   Former smoker 04/20/2022   Gastroesophageal reflux disease 08/19/2020   Hair loss 11/22/2016   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 (HCC)    High  risk medication use 11/22/2016   history of Chiari malformation    History of Chiari malformation 11/22/2016   history of COVID 02/2021   LOW GRADE FEVER CONGESTION AND HEADACHE ALL SYMPTOMS RESOLVED   Hot flashes 06/11/2021   Hyperlipidemia    Hypertension    Insomnia 08/19/2020   Low blood pressure    pt states history of low blood pressure-fainted 12/2010-instructed to move slowly   Nipple discharge 05/19/2022   Nonepileptic episode (HCC)  12/22/2016   Numbness and tingling 02/25/2016   Obesity (BMI 30.0-34.9) 12/16/2021   On continuous oral anticoagulation 05/17/2022   OSTEOARTHRITIS 08/14/2021   Other epilepsy, intractable, without status epilepticus (HCC) 11/01/2016   Pain in right knee 11/13/2020   Pain in unspecified joint 08/18/2016   Palpitations 12/15/2021   Pelvic pain in female 08/18/2021   Photosensitivity 11/22/2016   PONV (postoperative nausea and vomiting)    Preventative health care 09/08/2021   Psychogenic nonepileptic seizure 07/27/2017   Pulmonary embolism (HCC)    Raynaud's disease without gangrene 11/22/2016   Rectal bleeding 06/11/2021   RUQ pain 05/07/2022   Sacroiliac joint dysfunction of right side 12/19/2019   Sicca syndrome (HCC) 11/22/2016   SLE (systemic lupus erythematosus) (HCC) 03/26/2016   Status post laparoscopic cholecystectomy 06/08/2022   Subclinical hypothyroidism    Thyroid nodule    Transient alteration of awareness 01/01/2016   Vasomotor symptoms due to menopause 06/17/2021   Voice hoarseness 05/07/2022   Wears glasses for reading 08/14/2021   Past Surgical History:  Procedure Laterality Date   ABDOMINAL HYSTERECTOMY  2007   CHOLECYSTECTOMY N/A 06/08/2022   Procedure: LAPAROSCOPIC CHOLECYSTECTOMY WITH ICG;  Surgeon: Almond Lint, MD;  Location: [facility];  Service: General;  Laterality: N/A;   CYSTOSCOPY  2007   WITH HYSTERECTOMY   DIAGNOSTIC LAPAROSCOPY  2005   LUMBAR LAMINECTOMY/DECOMPRESSION MICRODISCECTOMY Left 11/08/2022   Procedure: L4-5 FAR LEFT LATERAL DISCECTOMY;  Surgeon: London Sheer, MD;  Location: [facility];  Service: Orthopedics;  Laterality: Left;   OOPHORECTOMY Left 08/18/2021   ROTATOR CUFF REPAIR Right 2010   SALPINGOOPHORECTOMY  04/16/2011   Procedure: SALPINGO OOPHERECTOMY;  Surgeon: Lenoard Aden, MD;  Location: [facility];  Service: Gynecology;  Laterality: Right;   skull decompression surgery 2005     @ [facility]     A IV  Location/Drains/Wounds Patient Lines/Drains/Airways Status     Active Line/Drains/Airways     Name Placement date Placement time Site Days   Peripheral IV 12/23/22 20 G Right Antecubital 12/23/22  1255  Antecubital  less than 1            Intake/Output Last 24 hours No intake or output data in the 24 hours ending 12/23/22 1300  Labs/Imaging No results found for this or any previous visit (from the past 48 hour(s)). No results found.  Pending Labs Unresulted Labs (From admission, onward)     Start     Ordered   12/30/22 0500  Creatinine, serum  (enoxaparin (LOVENOX)    CrCl >/= 30 ml/min)  Weekly,   R     Comments: while on enoxaparin therapy    12/23/22 1255   12/23/22 1254  HIV Antibody (routine testing w rflx)  (HIV Antibody (Routine testing w reflex) panel)  Once,   R        12/23/22 1255   12/23/22 1141  Comprehensive metabolic panel  Once,   STAT        12/23/22 1140   12/23/22 1141  CBC with Differential  Once,   STAT        12/23/22 1140            Vitals/Pain Today's Vitals   12/23/22 1104 12/23/22 1119 12/23/22 1230  BP: 110/75  92/63  Pulse: 87  61  Resp: 16  14  Temp: 97.6 F (36.4 C)    SpO2: 100%  100%  Weight:  82.6 kg   Height:  5\' 9"  (1.753 m)   PainSc:  7      Isolation Precautions No active isolations  Medications Medications  enoxaparin (LOVENOX) injection 40 mg (has no administration in time range)  methocarbamol (ROBAXIN) tablet 500 mg (has no administration in time range)    Or  methocarbamol (ROBAXIN) 500 mg in dextrose 5 % 50 mL IVPB (has no administration in time range)  ondansetron (ZOFRAN) tablet 4 mg (has no administration in time range)    Or  ondansetron (ZOFRAN) injection 4 mg (has no administration in time range)  oxyCODONE (Oxy IR/ROXICODONE) immediate release tablet 5-15 mg (has no administration in time range)  morphine (PF) 2 MG/ML injection 2 mg (has no administration in time range)  morphine (PF) 4 MG/ML  injection 4 mg (4 mg Intravenous Given 12/23/22 1300)  ondansetron (ZOFRAN) injection 4 mg (4 mg Intravenous Given 12/23/22 1259)    Mobility walks     Focused Assessments    R Recommendations: See Admitting Provider Note  Report given to:   Additional Notes: Patient is A&Ox4, ambulatory but uncomfortable. Patient is having intermittent incontinence due to her back problem and plans are for OR tonight. Patient has been NPO since 0600 but can swallow pills whole when out of OR.

## 2022-12-24 ENCOUNTER — Encounter: Payer: Self-pay | Admitting: Orthopedic Surgery

## 2022-12-24 ENCOUNTER — Inpatient Hospital Stay (HOSPITAL_COMMUNITY): Payer: BC Managed Care – PPO

## 2022-12-24 ENCOUNTER — Other Ambulatory Visit: Payer: Self-pay

## 2022-12-24 DIAGNOSIS — M5126 Other intervertebral disc displacement, lumbar region: Secondary | ICD-10-CM | POA: Diagnosis present

## 2022-12-24 LAB — CBC
HCT: 33.6 % — ABNORMAL LOW (ref 36.0–46.0)
Hemoglobin: 11.1 g/dL — ABNORMAL LOW (ref 12.0–15.0)
MCH: 30.1 pg (ref 26.0–34.0)
MCHC: 33 g/dL (ref 30.0–36.0)
MCV: 91.1 fL (ref 80.0–100.0)
Platelets: 260 10*3/uL (ref 150–400)
RBC: 3.69 MIL/uL — ABNORMAL LOW (ref 3.87–5.11)
RDW: 12.9 % (ref 11.5–15.5)
WBC: 9.2 10*3/uL (ref 4.0–10.5)
nRBC: 0 % (ref 0.0–0.2)

## 2022-12-24 LAB — BASIC METABOLIC PANEL
Anion gap: 9 (ref 5–15)
BUN: 7 mg/dL (ref 6–20)
CO2: 25 mmol/L (ref 22–32)
Calcium: 8.9 mg/dL (ref 8.9–10.3)
Chloride: 104 mmol/L (ref 98–111)
Creatinine, Ser: 0.83 mg/dL (ref 0.44–1.00)
GFR, Estimated: >60 mL/min (ref >60)
Glucose, Bld: 139 mg/dL — ABNORMAL HIGH (ref 70–99)
Potassium: 4 mmol/L (ref 3.5–5.1)
Sodium: 138 mmol/L (ref 135–145)

## 2022-12-24 MED ORDER — HYDROMORPHONE HCL 1 MG/ML IJ SOLN
INTRAMUSCULAR | Status: DC | PRN
Start: 2022-12-24 — End: 2022-12-24

## 2022-12-24 MED ORDER — SENNA 8.6 MG PO TABS
1.0000 | ORAL_TABLET | Freq: Two times a day (BID) | ORAL | Status: DC
  Administered 2022-12-24 – 2022-12-25 (×3): 8.6 mg via ORAL
  Filled 2022-12-24 (×3): qty 1

## 2022-12-24 MED ORDER — LEVOTHYROXINE SODIUM 50 MCG PO TABS
50.0000 ug | ORAL_TABLET | Freq: Every day | ORAL | Status: DC
  Administered 2022-12-24 – 2022-12-25 (×2): 50 ug via ORAL
  Filled 2022-12-24 (×2): qty 1

## 2022-12-24 MED ORDER — SUMATRIPTAN SUCCINATE 100 MG PO TABS: 100.0000 mg | ORAL_TABLET | ORAL | Status: DC | PRN

## 2022-12-24 MED ORDER — VANCOMYCIN HCL 1000 MG IV SOLR
INTRAVENOUS | Status: DC | PRN
  Administered 2022-12-24: 1000 mg via TOPICAL

## 2022-12-24 MED ORDER — HYDROMORPHONE HCL 1 MG/ML IJ SOLN
0.5000 mg | INTRAMUSCULAR | Status: DC | PRN
  Administered 2022-12-24 – 2022-12-25 (×5): 0.5 mg via INTRAVENOUS
  Filled 2022-12-24 (×5): qty 0.5

## 2022-12-24 MED ORDER — DIPHENHYDRAMINE HCL 50 MG/ML IJ SOLN
INTRAMUSCULAR | Status: DC | PRN
Start: 2022-12-24 — End: 2022-12-24
  Administered 2022-12-24: 12.5 mg via INTRAVENOUS

## 2022-12-24 MED ORDER — DEXAMETHASONE SODIUM PHOSPHATE 10 MG/ML IJ SOLN
8.0000 mg | Freq: Three times a day (TID) | INTRAMUSCULAR | Status: AC
  Administered 2022-12-24 (×3): 8 mg via INTRAVENOUS
  Filled 2022-12-24 (×3): qty 1

## 2022-12-24 MED ORDER — ACETAMINOPHEN 10 MG/ML IV SOLN
INTRAVENOUS | Status: DC | PRN
  Administered 2022-12-24: 1000 mg via INTRAVENOUS

## 2022-12-24 MED ORDER — SUGAMMADEX SODIUM 200 MG/2ML IV SOLN
INTRAVENOUS | Status: DC | PRN
  Administered 2022-12-24: 50 mg via INTRAVENOUS

## 2022-12-24 MED ORDER — METHOCARBAMOL 500 MG PO TABS
500.0000 mg | ORAL_TABLET | Freq: Four times a day (QID) | ORAL | Status: DC
  Administered 2022-12-24 – 2022-12-25 (×5): 500 mg via ORAL
  Filled 2022-12-24 (×5): qty 1

## 2022-12-24 MED ORDER — HYDROMORPHONE HCL 1 MG/ML IJ SOLN
INTRAMUSCULAR | Status: DC | PRN
Start: 2022-12-24 — End: 2022-12-24
  Administered 2022-12-24 (×2): .5 mg via INTRAVENOUS

## 2022-12-24 MED ORDER — OXYCODONE HCL 5 MG PO TABS
5.0000 mg | ORAL_TABLET | ORAL | Status: DC | PRN
  Administered 2022-12-24 – 2022-12-25 (×6): 10 mg via ORAL
  Filled 2022-12-24 (×5): qty 2

## 2022-12-24 MED ORDER — SERTRALINE HCL 50 MG PO TABS
50.0000 mg | ORAL_TABLET | Freq: Every day | ORAL | Status: DC
  Administered 2022-12-24: 50 mg via ORAL
  Filled 2022-12-24: qty 1

## 2022-12-24 MED ORDER — CEFAZOLIN SODIUM-DEXTROSE 2-4 GM/100ML-% IV SOLN
2.0000 g | Freq: Four times a day (QID) | INTRAVENOUS | Status: AC
  Administered 2022-12-24 (×3): 2 g via INTRAVENOUS
  Filled 2022-12-24 (×3): qty 100

## 2022-12-24 MED ORDER — ONDANSETRON HCL 4 MG PO TABS: 4.0000 mg | ORAL_TABLET | Freq: Four times a day (QID) | ORAL | Status: DC | PRN

## 2022-12-24 MED ORDER — ONDANSETRON HCL 4 MG/2ML IJ SOLN: 4.0000 mg | Freq: Four times a day (QID) | INTRAMUSCULAR | Status: DC | PRN

## 2022-12-24 MED ORDER — METHYLPREDNISOLONE ACETATE 40 MG/ML IJ SUSP
INTRAMUSCULAR | Status: DC | PRN
  Administered 2022-12-24: 40 mg

## 2022-12-24 MED ORDER — HYDROMORPHONE HCL 1 MG/ML IJ SOLN
INTRAMUSCULAR | Status: AC
  Filled 2022-12-24: qty 0.5

## 2022-12-24 MED ORDER — PANTOPRAZOLE SODIUM 40 MG PO TBEC
40.0000 mg | DELAYED_RELEASE_TABLET | Freq: Two times a day (BID) | ORAL | Status: DC
  Administered 2022-12-24 – 2022-12-25 (×3): 40 mg via ORAL
  Filled 2022-12-24 (×3): qty 1

## 2022-12-24 MED ORDER — GABAPENTIN 300 MG PO CAPS
300.0000 mg | ORAL_CAPSULE | Freq: Three times a day (TID) | ORAL | Status: DC
  Administered 2022-12-24 – 2022-12-25 (×4): 300 mg via ORAL
  Filled 2022-12-24 (×4): qty 1

## 2022-12-24 MED ORDER — ENSURE ENLIVE PO LIQD: 237.0000 mL | Freq: Two times a day (BID) | ORAL | Status: DC

## 2022-12-24 MED ORDER — LIOTHYRONINE SODIUM 5 MCG PO TABS
10.0000 ug | ORAL_TABLET | Freq: Every morning | ORAL | Status: DC
  Administered 2022-12-24 – 2022-12-25 (×2): 10 ug via ORAL
  Filled 2022-12-24 (×2): qty 2

## 2022-12-24 MED ORDER — POLYETHYLENE GLYCOL 3350 17 G PO PACK
17.0000 g | PACK | Freq: Every day | ORAL | Status: DC
  Administered 2022-12-24 – 2022-12-25 (×2): 17 g via ORAL
  Filled 2022-12-24 (×2): qty 1

## 2022-12-24 MED ORDER — ACETAMINOPHEN 500 MG PO TABS
1000.0000 mg | ORAL_TABLET | Freq: Three times a day (TID) | ORAL | Status: AC
  Administered 2022-12-24 – 2022-12-25 (×4): 1000 mg via ORAL
  Filled 2022-12-24 (×4): qty 2

## 2022-12-24 MED ORDER — DIPHENHYDRAMINE HCL 50 MG/ML IJ SOLN
INTRAMUSCULAR | Status: AC
  Filled 2022-12-24: qty 1

## 2022-12-24 MED ORDER — ACETAMINOPHEN 10 MG/ML IV SOLN
INTRAVENOUS | Status: AC
  Filled 2022-12-24: qty 100

## 2022-12-24 MED ORDER — MELATONIN 5 MG PO TABS
20.0000 mg | ORAL_TABLET | Freq: Every day | ORAL | Status: DC
  Administered 2022-12-24: 20 mg via ORAL
  Filled 2022-12-24: qty 4

## 2022-12-24 MED ORDER — TRANEXAMIC ACID-NACL 1000-0.7 MG/100ML-% IV SOLN
1000.0000 mg | Freq: Once | INTRAVENOUS | Status: AC
  Administered 2022-12-24: 1000 mg via INTRAVENOUS
  Filled 2022-12-24: qty 100

## 2022-12-24 MED ORDER — FENTANYL CITRATE (PF) 100 MCG/2ML IJ SOLN
INTRAMUSCULAR | Status: AC
  Filled 2022-12-24: qty 2

## 2022-12-24 MED FILL — Thrombin For Soln Kit 20000 Unit: CUTANEOUS | Qty: 1 | Status: AC

## 2022-12-24 NOTE — Progress Notes (Signed)
Orthopedic Surgery Post-operative Progress Note  Assessment: Patient is a 42 y.o. female who is currently admitted after undergoing L4/5 TLIF, PSIF, far lateral discectomy   Plan: -Operative plans complete -Needs upright films when able -Drains to be maintained until output slows -Out of bed as tolerated, no brace -No bending/lifting/twisting greater than 10 pounds -PT evaluate and treat -Pain control -Regular diet -No chemoprophylaxis for dvt or antiplatelets for 72 hours after surgery -Ancef x2 post-operative doses -Disposition: remain floor status  Hypothyroidism: -continue home replacement  History of DVT/PE -hold home eliquis until 72hrs after surgery  GERD -continue home PPI  Migraines -continue home sumatriptan   Insomnia -continue home melatonin  Depression/Anxiety -continue home zoloft ___________________________________________________________________________   Subjective: No acute events overnight. Returned to the floor last night. Having low back pain but medications are helping. Not having any right leg pain this morning. Denies paresthesias and numbness.   Objective:  General: no acute distress, appropriate affect Neurologic: alert, answering questions appropriately, following commands Respiratory: unlabored breathing on room air Skin: dressing clear/dry/intact, drains with serosanguinous output  MSK (spine):  -Strength exam      Right  Left  EHL    5/5  5/5 TA    5/5  5/5 GSC    5/5  5/5 Knee extension  5/5  5/5 Hip flexion   5/5  5/5  -Sensory exam    Sensation intact to light touch in L3-S1 nerve distributions of bilateral lower extremities   Patient name: Kim Pham Patient MRN: [id number] Date: 12/24/22

## 2022-12-24 NOTE — Plan of Care (Signed)

## 2022-12-24 NOTE — Progress Notes (Signed)
Mobility Specialist: Progress Note   12/24/22 1400  Mobility  Activity Ambulated independently in hallway  Level of Assistance Independent  Assistive Device None  Distance Ambulated (ft) 275 ft  Activity Response Tolerated well  Mobility Referral Yes  $Mobility charge 1 Mobility  Mobility Specialist Start Time (ACUTE ONLY) 1151  Mobility Specialist Stop Time (ACUTE ONLY) 1159  Mobility Specialist Time Calculation (min) (ACUTE ONLY) 8 min    Pt was agreeable to mobility session - received in chair. Had c/o of discomfort in R knee, stated it felt like "pins and needles" poking inside of it. Also had 2 moments where she got hit with a "woozy' feeling and had to use handrails for safety d/t possible LOB. Stated knees used to give out before surgery but it does not happen anymore. She also stated her L arm was numb but it was d/t recent anesthesia from surgery. Returned to room without fault. Left in chair with all needs met, call bell in reach. NP in room.   [name] Mobility Specialist Please contact via SecureChat or Rehab office at [phone]

## 2022-12-24 NOTE — Evaluation (Signed)
Occupational Therapy Evaluation Patient Details Name: Kim Pham MRN: [id number] DOB: [birth year] Today's Date: 12/24/2022   History of Present Illness Patient is a 42 y.o. female who presents emergency room for evaluation of back pain, urinary incontinence and an abnormal MRI. Pt is currently admitted after undergoing L4/5 TLIF, PSIF, far lateral discectomy. PMH: anxiety, GERD, hyperlipidemia, HTN, insomnia   Clinical Impression   Pt s/p above diagnosis. Pt c/o pain 8/10 with mobility, did not limit participation in therapy. Pt lives at home with husband who can assist if needed. Pt ambulatory without AD upon arrival, able to perform all ADLs mod I with back precautions and increased time. Good strength/balance, safety awareness, able to verbalize precautions. Pt has all DME and assistance at home needed to remain safe and independent, no further acute OT or follow up needed.       If plan is discharge home, recommend the following: Assist for transportation;Help with stairs or ramp for entrance    Functional Status Assessment  Patient has had a recent decline in their functional status and demonstrates the ability to make significant improvements in function in a reasonable and predictable amount of time.  Equipment Recommendations  None recommended by OT    Recommendations for Other Services       Precautions / Restrictions Precautions Precautions: Back Precaution Booklet Issued: No Precaution Comments: able to verbalize precautions Restrictions Weight Bearing Restrictions: No      Mobility Bed Mobility               General bed mobility comments: arrived up and ambulating    Transfers Overall transfer level: Modified independent Equipment used: None                      Balance Overall balance assessment: Modified Independent                                         ADL either performed or assessed with clinical judgement   ADL  Overall ADL's : Modified independent                                       General ADL Comments: mod I, able to maintain precautions     Vision Baseline Vision/History: 1 Wears glasses Ability to See in Adequate Light: 0 Adequate Patient Visual Report: No change from baseline       Perception         Praxis         Pertinent Vitals/Pain Pain Assessment Pain Assessment: 0-10 Pain Score: 8  Pain Location: low back Pain Descriptors / Indicators: Aching, Constant, Discomfort Pain Intervention(s): Monitored during session     Extremity/Trunk Assessment Upper Extremity Assessment Upper Extremity Assessment: Overall WFL for tasks assessed   Lower Extremity Assessment Lower Extremity Assessment: Defer to PT evaluation       Communication Communication Communication: No apparent difficulties   Cognition Arousal: Alert Behavior During Therapy: WFL for tasks assessed/performed Overall Cognitive Status: Within Functional Limits for tasks assessed                                       General Comments  Exercises     Shoulder Instructions      Home Living Family/patient expects to be discharged to:: Private residence Living Arrangements: Spouse/significant other Available Help at Discharge: Family Type of Home: House Home Access: Stairs to enter Secretary/administrator of Steps: 2   Home Layout: One level     Bathroom Shower/Tub: Arts development officer Toilet: Handicapped height     Home Equipment: Agricultural consultant (2 wheels);Rollator (4 wheels);BSC/3in1;Shower seat;Toilet riser   Additional Comments: Pt lives at home with husband who can assist as needed      Prior Functioning/Environment Prior Level of Function : Independent/Modified Independent                        OT Problem List: Decreased range of motion;Decreased activity tolerance;Pain      OT Treatment/Interventions:      OT Goals(Current  goals can be found in the care plan section) Acute Rehab OT Goals Patient Stated Goal: to return home, manage pain OT Goal Formulation: With patient Time For Goal Achievement: 01/07/23 Potential to Achieve Goals: Good  OT Frequency:      Co-evaluation              AM-PAC OT "6 Clicks" Daily Activity     Outcome Measure Help from another person eating meals?: None Help from another person taking care of personal grooming?: None Help from another person toileting, which includes using toliet, bedpan, or urinal?: None Help from another person bathing (including washing, rinsing, drying)?: None Help from another person to put on and taking off regular upper body clothing?: None Help from another person to put on and taking off regular lower body clothing?: None 6 Click Score: 24   End of Session Nurse Communication: Mobility status  Activity Tolerance: Patient tolerated treatment well Patient left: in chair;with call bell/phone within reach  OT Visit Diagnosis: Other abnormalities of gait and mobility (R26.89);Pain Pain - part of body:  (low back)                Time: 1610-9604 OT Time Calculation (min): 14 min Charges:  OT General Charges $OT Visit: 1 Visit OT Evaluation $OT Eval Low Complexity: 1 Low  [address] OTR/L   Alexis Goodell 12/24/2022, 9:56 AM

## 2022-12-24 NOTE — Progress Notes (Signed)
Orthopedic Surgery Post-operative Progress Note  Assessment: Patient is a 42 y.o. female who is currently admitted after undergoing L4/5 TLIF, PSIF, far lateral discectomy   Plan: -Operative plans complete -Needs upright films when able -Drains to be maintained until output slows -Out of bed as tolerated, no brace -No bending/lifting/twisting greater than 10 pounds -PT evaluate and treat -Pain control -Regular diet -No chemoprophylaxis for dvt or antiplatelets for 72 hours after surgery -Ancef x2 post-operative doses -Disposition: to floor from PACU  Hypothyroidism: -continue home replacement  History of DVT/PE -hold home eliquis until 72hrs after surgery  GERD -continue home PPI  Migraines -continue home sumatriptan   Insomnia -continue home melatonin  Depression/Anxiety -continue home zoloft ___________________________________________________________________________   Subjective: No acute events since surgery. Recovering in PACU. Pain controlled.   Objective:  General: no acute distress, appropriate affect Neurologic: alert, answering questions appropriately, following commands Respiratory: unlabored breathing on room air Skin: dressing clear/dry/intact, drains with sanguinous output  MSK (spine):  -Strength exam      Right  Left  EHL    5/5  5/5 TA    5/5  5/5 GSC    5/5  5/5 Knee extension  5/5  5/5 Hip flexion   5/5  5/5  -Sensory exam    Sensation intact to light touch in L3-S1 nerve distributions of bilateral lower extremities   Patient name: Kim Pham Patient MRN: [id number] Date: 12/24/22

## 2022-12-24 NOTE — Op Note (Addendum)
Orthopedic Spine Surgery Operative Report  Procedure: L4/5 laminectomy for TLIF preparation L4/5 right-sided far lateral discectomy through a trans-facet approach L4/5 posterior lumbar spinal arthrodesis L4,L5 pedicle screw instrumentation with rods Insertion of biomechanical device into the L4/5 disc space Application of locally harvested autograft (spinous processes, removed lamina, facet joint bone) Application of demineralized bone matrix   Date of procedure: 12/23/2022  Patient name: Kim Pham MRN: [id number] DOB: 27-Nov-1980  Surgeon: Willia Craze, MD Assistant: none Pre-operative diagnosis: recurrent right-sided far lateral disc herniation Post-operative diagnosis: same as above Findings: small right-sided L4/5 far lateral disc herniation  Specimens: none Anesthesia: general EBL: 300cc Complications: none Pre-incision antibiotic: ancef TXA given prior to incision as well  Implants:  Implant Name Type Inv. Item Serial No. Manufacturer Lot No. LRB No. Used Action  BONE FIBERS PLIAFX 10 - [id number] Bone Implant BONE FIBERS PLIAFX 10 [id number] [employer]  N/A 1 Implanted  PUTTY DBM INSTAFILL CART 5CC - [id number] Putty PUTTY DBM INSTAFILL CART 5CC [id number] [employer] [id number] N/A 1 Implanted  SCREW LOCK RELINE 5.5 [place] - [id number] Screw SCREW LOCK RELINE 5.5 [place]  [employer]  N/A 4 Implanted  SCREW RELINE-O POLY 7.5X50 - [id number] Screw SCREW RELINE-O POLY 7.5X50  [employer]  N/A 3 Implanted  SCREW RELINE-O POLY 7.5X45 - [id number] Screw SCREW RELINE-O POLY 7.5X45  [employer]  N/A 1 Implanted  CAGE SABLE 10X26 8D - [id number] Cage CAGE SABLE 10X26 8D  [employer] [id number] N/A 1 Implanted  ROD RELINE LORDOTIC 5.5X45 - [id number] Rod ROD RELINE LORDOTIC 5.5X45  [employer]  N/A 2 Implanted      Indication for procedure: Patient is a 42 y.o. female who had a right-sided far lateral disc herniation at L4/5. She underwent intertransverse  far lateral discectomy about 6 weeks ago. She had relief of her radiating leg pain after the surgery but then after a weekend of retching, developed recurrent radiating pain into the right lower extremity. She was treated with oral medications including oxycodone but her pain persisted. After lifting her 90lb dog, it actually got worse. She was also having falls as a result of the pain and her leg giving way. An MRI was obtained that showed a recurrent far lateral disc herniation at L4/5. She had been seen in the office and operative management had been discussed as a likely next step since her pain was failing to get better and she was having falls. She then presented to the ER for pain. She was admitted to orthopedics. I talked to her about L4/5 transfacet approach to her far lateral disc herniation since the intertransverse plane had scar tissue from her last surgery and the normal anatomic planes would be distorted. I felt that this would put her exiting nerve root at risk for injury. Due to the destabilizing nature of taking out the facet joints on the right side, TLIF and posterior instrumented spinal fusion was discussed as a part of the procedure. I had told her that one of the risks of the surgery that I was particularly concerned about was adjacent segment disease given her age. Her discs also did not show any degenerative changes on MRI so I told her I would have to remove a healthy appearing disc. We also talked about other risks including but not limited to: durotomy, persistent pain, dvt/pe, infection, need for additional procedures, neurologic injury, pseudarthrosis, hardware failure, cage migration, death, fracture, heart attack, blindness. We talked about alternatives to surgery. After our discussion, patient  elected to proceed with surgery. Plans were made for operative management later in the evening. All of her and her husband's questions were answered to their satisfaction.  Procedure  Description: The patient was met in the pre-operative holding area. The patient's identity and consent were verified. The operative site was marked. The patient's remaining questions about the surgery were answered. The patient was brought back to the operating room. General anesthesia was induced and an endotracheal tube was placed by the anesthesia staff. The neuromonitoring technologist attached leads to the patient. The patient was transferred to the prone [place] table in the prone position. All bony prominences were well padded. The head of the bed was slightly elevated and the eyes were free from compression by the face pillow. The surgical area was cleansed with CHG scrub brush and alcohol. Fluoroscopy was then brought in to check rotation on the AP image and to mark the levels on the lateral image. The patient's skin was then prepped and draped in a standard, sterile fashion. Baseline neuromonitoring signals were obtained. A time out was performed that identified the patient, the procedure, and the operative levels. All team members agreed with what was stated in the time out.   A midline incision over the spinous processes of the previously marked levels was made and sharp dissection was continued down through the skin and dermis. Electrocautery was then used to continue the midline dissection down to the level of the spinous process. Subperiosteal dissection was performed using electrocautery to expose the lamina out lateral to the facet joint capsule. Care was taken to not violate the facet joint capsules. A lateral fluoroscopic image was taken to confirm the level. Subperiosteal dissection with electrocautery was then done to expose the pars and lamina of L4 and the cranial aspect of the L5 lamina.  Again, care was taken to avoid disruption of the facet capsules. Electrocautery was then used to dissect lateral the facets to find the transverse process at L4 and L5. Electrocautery was used to clear the  soft tissue off the transverse processes. Electrocautery was used to dissect off the L4/5 facet capsules.   Next, the pedicle screws were placed. The starting points for the pedicle screws were identified using anatomical landmarks. Fluoroscopy was brought in in the AP view to confirm the start points. Each pedicle screw was placed under AP fluoroscopy. A true AP was obtained of the vertebra where screw was being placed. Then, a pilot hole was made with a 3mm matchstick burr at the start point. A pedicle finder was advanced into the pilot hole. AP fluoroscopy was used to confirm proper starting point. A image was taken when the pedicle finder had been advanced 10mm, 15mm, and at 20mm. The pedicle finder remained within the pedicle and did not breach medially at any of those markers. The pedicle finder was then advanced to 35mm and then withdrawn. A feeler was placed into the hole and confirmed no pedicle wall breaches. It was advanced to the ventral cortex by palpation. The screw length was then estimated off of the depth of the feeler. The widths of the screws had been predetermined as they were measured pre-operatively on the patient's advanced imaging. That measured length and pre-determined width screw was then inserted under AP fluoroscopic guidance. The screw was advanced approximately 10mm, 15mm, and then 20mm. At each one of those intervals an image was taken to confirm that the screw was following the trajectory of the pedicle and had not breached medially. The  screw was then advanced fully until there was good purchase. This process was repeated for every pedicle screw.     The screws that were inserted were [employer] Reline:               Left                  Right 7.5x50   7.5x50 7.5x45   7.5x50   AP and lateral fluoroscopic images were then taken to visualize all the screws. The screws were in satisfactory position. The screws were stimulated and none of them stimulated under 20.  A rongeur  was used to remove the spinous processes and interspinous ligaments between the L3/4 interspinous ligament to the L4/5 interspinous ligament. The spinous processes, but not the interspinous ligaments or soft tissue, was saved and morselized on the back table. Bone wax was used to obtain hemostasis at the bleeding bony surfaces. A high-speed burr was used to thin the L4 lamina and the cranial L5 lamina to the level of the ligamentum flavum. More lamina was preserved on the left side for later fusion. Above the level of the ligamentum, the lamina was thinned with the burr to the approximate level of the ligamentum. A series of Kerrison rongeurs were used to remove the remaining lamina and ligamentum overlying the thecal sac.   A burr was used to thin the bone from the laminectomy defect to the edge of the right pars. An osteotome was used to break the remaining pars. The inferior articular process of L4 was then removed. A woodsen was placed just cranial to the right L5 pedicle. An osteotome was used to remove the L5 superior articular process above the pedicle. Both the facets that were removed were collected on the back tablet to be used later as bone graft. The thecal sac was retracted medially. An annulotomy was made in the L4/5 disc. A pituitary was was used to remove the disc in the annulotomy. A series of dilators were used to scrape the disc and endplates. A combination of curved and straight curettes were used to remove the soft tissue off of the endplates. A pituitary was used to remove the disc material. At this point, the end plates were felt to be well prepared. A 9x10x26 cage was selected based off of pre-operative imaging. A funnel was used to insert autograft into the prepared disc space. The cage was inserted into the former disc space. The cage was expanded under lateal fluoroscopy until there was a good fit. This occurred when it expanded to a height of 14.5. The cage was back filled with  demineralize bone matrix. The right L4 nerve root was identified under the L4 pedicle. There was a small disc herniation lateral to where the facet was. This was removed with a pituitary. A downgoing curette was placed under the L4 nerve root and was used to push any material (including disc) under the nerve root deeper. A pituitary was used to remove this material. At this point, a woodsen was able to be easily passed under the nerve root. No compression was seen or felt on the nerve root from underneath the pedicle until it exited out lateral to the vertebral body and the disc. A woodsen was placed onto the disc and an XR was taken to demonstrate how far lateral the extent of the discectomy and clearing off of tissue near the L4 nerve had taken place. At this point, there was no further tissue compressing on the  L4 nerve root. Neuromonitoring signals were checked and there were no changes from baseline.   A 45x5.5 titanium rod was placed into the pedicle screws and set screws were tightened over the rod into each pedicle screw. The same process was repeated for the contralateral side. All the set screws were final tightened. AP and lateral fluoroscopic images were taken. Both images demonstrated satisfactory position of the instrumentation. The wound was copiously irrigated with sterile saline.   A high speed burr was used to decorticate the left L4/5 facet joints, the L4 pars interarticularis, and the L4 and L5 transverse processes at the planned arthrodesis levels. The locally harvested and morselized autograft and demineralized bone matrix were placed over the decorticated bone. All bony fragments that inadvertently went into the laminectomy defect were removed. The decompressed area was again palpated with a woodsen which confirmed satisfactory decompression.   1g of vancomycin powder was placed into the wound. A medium hemovac drain was placed deep to the fascia. The fascia was reapproximated with 0  vicryl suture.Final neuromonitoring signals were checked and there was no change from baseline.  A subcutaneous hemovac drain was placed above the fascia. The subcutaneous fat was reapproximated with 0 vicryl suture. The deep dermal layer was reapproximated with 2-0 viryl. The skin as closed with a 3-0 running moncryl. All counts were correct at the end of the case. The incision was dressed with steri strips and benzoine. An island dressing was placed over the wound. The patient was transferred back to a bed and brought to the post-anesthesia care unit by anesthesia staff in stable condition.  Post-operative plan: The patient will recover in the post-anesthesia care unit and then go to the floor. The patient will receive two post-operative doses of ancef. The patient will receive another dose of TXA. The patient will be out of bed as tolerated with no brace. The patient will work with physical therapy. The drains will likely be removed on the day of discharge. Anticipate the patient returning home after a few days in the hospital.   Willia Craze, MD Orthopedic Surgeon

## 2022-12-24 NOTE — Transfer of Care (Signed)
Immediate Anesthesia Transfer of Care Note  Patient: JAQUISE FEIN  Procedure(s) Performed: TLIF L 4-5  Patient Location: PACU  Anesthesia Type:General  Level of Consciousness: drowsy and patient cooperative  Airway & Oxygen Therapy: Patient Spontanous Breathing and Patient connected to nasal cannula oxygen  Post-op Assessment: Report given to RN, Post -op Vital signs reviewed and stable, and Patient moving all extremities X 4  Post vital signs: Reviewed and stable  Last Vitals:  Vitals Value Taken Time  BP 124/55 12/24/22 0136  Temp    Pulse 107 12/24/22 0138  Resp 20 12/24/22 0138  SpO2 97 % 12/24/22 0138  Vitals shown include unfiled device data.  Last Pain:  Vitals:   12/23/22 1643  TempSrc:   PainSc: 7          Complications: No notable events documented.

## 2022-12-24 NOTE — Discharge Summary (Incomplete)
Orthopedic Surgery Discharge Summary  Patient name: Kim Pham Patient MRN: [id number] Admit today: 12/23/2022 Discharge date: 12/25/2022  Attending physician: Willia Craze, MD Final diagnosis: L4/5 far lateral disc herniation Findings: L4/5 far lateral disc herniation  Hospital course: Patient is a 42 y.o. female who was admitted for right leg pain due to a right sided far lateral disc herniation. She underwent L4/5 far lateral discectomy, L4/5 TLIF, L4/5 PSIF on 12/23/2022 due to this pain that was getting worse over time. The patient had significant pain immediately after surgery, but pain eventually was controlled with a multimodal regimen including oxycodone. The patient worked with physical therapy who recommended discharge to home. The patient was tolerating an oral diet without issue and was voiding spontaneously after surgery. The patient was dressing herself and walking the halls without assistive devices in the hospital. The patient's vitals were stable on the day of discharge. The patient's drains were left in place at discharge and she was given instructions on how to remove them. The patient was medically ready for discharge and was discharge to home on post-operative day 2.  Instructions:   Orthopedic Surgery Discharge Instructions  Patient name: Kim Pham Procedure Performed: L4/5 far lateral discectomy, L4/5 TLIF, L4/5 posterior instrumented spinal fusion Date of Surgery: 12/23/2022 Surgeon: Willia Craze, MD  Pre-operative Diagnosis: L4/5 far lateral disc herniation Post-operative Diagnosis: same as above  Discharge Date: 12/25/2022 Discharged to: home Discharge Condition: stable  Activity: You should refrain from bending, lifting, or twisting with objects greater than ten pounds until three months after surgery. You are encouraged to walk as much as desired. You can perform household activities such as cleaning dishes, doing laundry, vacuuming, etc. as long as  the ten-pound restriction is followed. You do not need to wear a brace during the post-operative period.   Incision Care: Your incision site has a dressing over it. That dressing should remain in place and dry at all times for a total of one week after surgery. After one week, you can remove the dressing. Underneath the dressing, you will find pieces of tape. You should leave these pieces of tape in place. They will fall off with time. Do not pick, rub, or scrub at them. Do not put cream or lotion over the surgical area. After one week and once the dressing is off, it is okay to let soap and water run over your incision. Again, do not pick, scrub, or rub at the pieces of tape when bathing. Do not submerge (e.g., take a bath, swim, go in a hot tub, etc.) until six weeks after surgery. There may be some bloody drainage from the incision into the dressing after surgery. This is normal. You do not need to replace the dressing. Continue to leave it in place for the one week as instructed above. Should the dressing become saturated with blood or drainage, please call the office for further instructions.   Medications: You have been prescribed oxycodone. This is a narcotic pain medication and should only be taken as prescribed. You should not drink alcohol or operate heavy machinery (including driving) while taking this medication. The oxycodone can cause constipation as a side effect. For that reason, you have been prescribed senna and miralax. These are both laxatives. You do not need to take this medication if you develop diarrhea. Should you remain constipated even while taking these medications, please increase the dose of miralax to twice daily. Tylenol has been prescribed to be taken every 8 hours,  which will give you additional pain relief. Robaxin is a muscle relaxer that has been prescribed to you for muscle spasm type pain. Take this medication as needed.   Do not take NSAIDs (ibuprofen, Aleve, Celebrex,  naproxen, meloxicam, etc.) for the first 6 weeks after surgery as there is some evidence that their use may decrease the chances of successful fusion.   In order to set expectations for opioid prescriptions, you will only be prescribed opioids for a total of six weeks after surgery and, at two-weeks after surgery, your opioid prescription will start to tapered (decreased dosage and number of pills). If you have ongoing need for opioid medication six weeks after surgery, you will be referred to pain management. If you are already established with a provider that is giving you opioid medications, you should schedule an appointment with them for six weeks after surgery if you feel you are going to need another prescription. State law only allows for opioid prescriptions one week at a time. If you are running out of opioid medication near the end of the week, please call the office during business hours before running out so I can send you another prescription.   You may resume any home blood thinners (warfarin, lovenox, apixaban, plavix, xarelto, etc) 72 hours after your surgery. Take these medications as they were previously prescribed.  Driving: You should not drive while taking narcotic pain medications. You should start getting back to driving slowly and you may want to try driving in a parking lot before doing anything more.   Diet: You are safe to resume your regular diet after surgery.   Reasons to Call the Office After Surgery: You should feel free to call the office with any concerns or questions you have in the post-operative period, but you should definitely notify the office if you develop: -shortness of breath, chest pain, or trouble breathing -excessive bleeding, drainage, redness, or swelling around the surgical site -fevers, chills, or pain that is getting worse with each passing day -persistent nausea or vomiting -new weakness in either leg -new or worsening numbness or tingling in  either leg -numbness in the groin, bowel or bladder incontinence -other concerns about your surgery  Follow Up Appointments: You should see Dr. Christell Constant approximately 2 weeks after to get XRs done and to have the wound evaluated. Please call the office to schedule this appointment at a date/time that is convenient for you.  Office Information:  -Willia Craze, MD -Phone number: [phone] -Address: [address]       [place], [place] [address]

## 2022-12-24 NOTE — Brief Op Note (Signed)
12/23/2022 - 12/24/2022  1:28 AM  PATIENT:  Kim Pham  42 y.o. female  PRE-OPERATIVE DIAGNOSIS:  acute low back pain  POST-OPERATIVE DIAGNOSIS:  acute low back pain  PROCEDURE:  Procedure(s): TLIF L 4-5 (N/A)  SURGEON:  Surgeons and Role:    London Sheer, MD - Primary  PHYSICIAN ASSISTANT:   ASSISTANTS: none   ANESTHESIA:   general  EBL:  300 mL   BLOOD ADMINISTERED:none  DRAINS: 2 medium hemovac drains  LOCAL MEDICATIONS USED:  NONE  SPECIMEN:  No Specimen  DISPOSITION OF SPECIMEN:  N/A  COUNTS:  YES  TOURNIQUET: NONE  DICTATION: .Note written in EPIC  PLAN OF CARE: Admit to inpatient   PATIENT DISPOSITION:  PACU - hemodynamically stable.   Delay start of Pharmacological VTE agent (>24hrs) due to surgical blood loss or risk of bleeding: yes

## 2022-12-24 NOTE — Anesthesia Postprocedure Evaluation (Signed)
Anesthesia Post Note  Patient: MILCA MCERLEAN  Procedure(s) Performed: TLIF L 4-5     Patient location during evaluation: PACU Anesthesia Type: General Level of consciousness: awake and alert Pain management: pain level controlled Vital Signs Assessment: post-procedure vital signs reviewed and stable Respiratory status: spontaneous breathing, nonlabored ventilation, respiratory function stable and patient connected to nasal cannula oxygen Cardiovascular status: blood pressure returned to baseline and stable Postop Assessment: no apparent nausea or vomiting Anesthetic complications: no   No notable events documented.  Last Vitals:  Vitals:   12/24/22 0200 12/24/22 0217  BP: 113/73 112/73  Pulse: 94 91  Resp: 18 16  Temp: 36.7 C 36.8 C  SpO2: 93% 99%    Last Pain:  Vitals:   12/24/22 0408  TempSrc:   PainSc: 9                  Collene Schlichter

## 2022-12-24 NOTE — Discharge Instructions (Addendum)
Orthopedic Surgery Discharge Instructions  Patient name: Kim Pham Procedure Performed: L4/5 far lateral discectomy, L4/5 TLIF, L4/5 posterior instrumented spinal fusion Date of Surgery: 12/23/2022 Surgeon: Willia Craze, MD  Pre-operative Diagnosis: L4/5 far lateral disc herniation Post-operative Diagnosis: same as above  Discharge Date: 12/25/2022 Discharged to: home Discharge Condition: stable  Activity: You should refrain from bending, lifting, or twisting with objects greater than ten pounds until three months after surgery. You are encouraged to walk as much as desired. You can perform household activities such as cleaning dishes, doing laundry, vacuuming, etc. as long as the ten-pound restriction is followed. You do not need to wear a brace during the post-operative period.   Incision Care: Your incision site has a dressing over it. That dressing should remain in place and dry at all times for a total of one week after surgery. After one week, you can remove the dressing. Underneath the dressing, you will find pieces of tape. You should leave these pieces of tape in place. They will fall off with time. Do not pick, rub, or scrub at them. Do not put cream or lotion over the surgical area. After one week and once the dressing is off, it is okay to let soap and water run over your incision. Again, do not pick, scrub, or rub at the pieces of tape when bathing. Do not submerge (e.g., take a bath, swim, go in a hot tub, etc.) until six weeks after surgery. There may be some bloody drainage from the incision into the dressing after surgery. This is normal. You do not need to replace the dressing. Continue to leave it in place for the one week as instructed above. Should the dressing become saturated with blood or drainage, please call the office for further instructions.   Medications: You have been prescribed oxycodone. This is a narcotic pain medication and should only be taken as  prescribed. You should not drink alcohol or operate heavy machinery (including driving) while taking this medication. The oxycodone can cause constipation as a side effect. For that reason, you have been prescribed senna and miralax. These are both laxatives. You do not need to take this medication if you develop diarrhea. Should you remain constipated even while taking these medications, please increase the dose of miralax to twice daily. Tylenol has been prescribed to be taken every 8 hours, which will give you additional pain relief. Robaxin is a muscle relaxer that has been prescribed to you for muscle spasm type pain. Take this medication as needed.   Do not take NSAIDs (ibuprofen, Aleve, Celebrex, naproxen, meloxicam, etc.) for the first 6 weeks after surgery as there is some evidence that their use may decrease the chances of successful fusion.   In order to set expectations for opioid prescriptions, you will only be prescribed opioids for a total of six weeks after surgery and, at two-weeks after surgery, your opioid prescription will start to tapered (decreased dosage and number of pills). If you have ongoing need for opioid medication six weeks after surgery, you will be referred to pain management. If you are already established with a provider that is giving you opioid medications, you should schedule an appointment with them for six weeks after surgery if you feel you are going to need another prescription. State law only allows for opioid prescriptions one week at a time. If you are running out of opioid medication near the end of the week, please call the office during business hours before running  out so I can send you another prescription.   You may resume any home blood thinners (warfarin, lovenox, apixaban, plavix, xarelto, etc) 72 hours after your surgery. Take these medications as they were previously prescribed.  Driving: You should not drive while taking narcotic pain medications. You  should start getting back to driving slowly and you may want to try driving in a parking lot before doing anything more.   Diet: You are safe to resume your regular diet after surgery.   Reasons to Call the Office After Surgery: You should feel free to call the office with any concerns or questions you have in the post-operative period, but you should definitely notify the office if you develop: -shortness of breath, chest pain, or trouble breathing -excessive bleeding, drainage, redness, or swelling around the surgical site -fevers, chills, or pain that is getting worse with each passing day -persistent nausea or vomiting -new weakness in either leg -new or worsening numbness or tingling in either leg -numbness in the groin, bowel or bladder incontinence -other concerns about your surgery  Follow Up Appointments: You should see Dr. Christell Constant approximately 2 weeks after to get XRs done and to have the wound evaluated. Please call the office to schedule this appointment at a date/time that is convenient for you.  Office Information:  -Willia Craze, MD -Phone number: [phone] -Address: [address]       [place], [place] [address]

## 2022-12-25 MED ORDER — OXYCODONE HCL 5 MG PO TABS: 5.0000 mg | ORAL_TABLET | ORAL | 0 refills | Status: DC | PRN

## 2022-12-25 MED ORDER — SENNA 8.6 MG PO TABS: 1.0000 | ORAL_TABLET | Freq: Two times a day (BID) | ORAL | 0 refills | Status: AC

## 2022-12-25 MED ORDER — METHOCARBAMOL 750 MG PO TABS: 750.0000 mg | ORAL_TABLET | Freq: Four times a day (QID) | ORAL | 0 refills | Status: DC

## 2022-12-25 MED ORDER — POLYETHYLENE GLYCOL 3350 17 G PO PACK: 17.0000 g | PACK | Freq: Every day | ORAL | 0 refills | Status: AC

## 2022-12-25 MED ORDER — ACETAMINOPHEN 500 MG PO TABS: 1000.0000 mg | ORAL_TABLET | Freq: Three times a day (TID) | ORAL | 0 refills | Status: AC

## 2022-12-25 NOTE — Progress Notes (Signed)
Orthopedic Surgery Post-operative Progress Note  Assessment: Patient is a 42 y.o. female who is currently admitted after undergoing L4/5 TLIF, PSIF, far lateral discectomy   Plan: -Operative plans complete -Drains removed this morning -Out of bed as tolerated, no brace -No bending/lifting/twisting greater than 10 pounds -PT evaluate and treat -Pain control -Regular diet -No chemoprophylaxis for dvt or antiplatelets for 72 hours after surgery -Ancef x2 post-operative doses -Anticipate discharge to home today  Hypothyroidism: -continue home replacement  History of DVT/PE -hold home eliquis until 72hrs after surgery  GERD -continue home PPI  Migraines -continue home sumatriptan   Insomnia -continue home melatonin  Depression/Anxiety -continue home zoloft ___________________________________________________________________________   Subjective: No acute events overnight. Walked the halls several times yesterday. Has some decreased sensation in bilateral LFCN distributions but no other numbness or paresthesias. Leg pain has resolved. Back pain is controlled with current medications.   Objective:  General: no acute distress, appropriate affect Neurologic: alert, answering questions appropriately, following commands Respiratory: unlabored breathing on room air Skin: dressing clear/dry/intact, drains with minimal serosanguinous output  MSK (spine):  -Strength exam      Right  Left  EHL    5/5  5/5 TA    5/5  5/5 GSC    5/5  5/5 Knee extension  5/5  5/5 Hip flexion   5/5  5/5  -Sensory exam    Sensation intact to light touch in L3-S1 nerve distributions of bilateral lower extremities  Drain output for each drain was less 30 over the last shift  Patient name: Kim Pham Patient MRN: [id number] Date: 12/25/22

## 2022-12-25 NOTE — Progress Notes (Signed)
Discharge summary(AVS) provided to pt with instructions. Pt verbalized understanding of instructions. No complaints. Pt d/c to home as ordered. She remains alert/oriented in no apparent distress. Per pt her husband is responsible for her ride.

## 2022-12-28 ENCOUNTER — Telehealth: Payer: Self-pay

## 2022-12-28 NOTE — Transitions of Care (Post Inpatient/ED Visit) (Signed)
12/28/2022  Name: Kim Pham MRN: [id number] DOB: 1981-04-05  Today's TOC FU Call Status: Today's TOC FU Call Status:: Successful TOC FU Call Completed TOC FU Call Complete Date: 12/28/22 Patient's Name and Date of Birth confirmed.  Transition Care Management Follow-up Telephone Call Date of Discharge: 12/25/22 Discharge Facility: [facility]) Type of Discharge: Inpatient Admission Primary Inpatient Discharge Diagnosis:: "acute low back pain" How have you been since you were released from the hospital?: Better (Pt states she is doing okay-managing pain by following pain regimen-current pain 7/10-took Oxycodone last at 9am.-having some muscle spasms-up walking & moving around. Appetite fair. LBM yest. Ssurg drsg intact.) Any questions or concerns?: No  Items Reviewed: Did you receive and understand the discharge instructions provided?: Yes Medications obtained,verified, and reconciled?: Yes (Medications Reviewed) Any new allergies since your discharge?: No Dietary orders reviewed?: Yes Type of Diet Ordered:: low salt/heart healthy Do you have support at home?: Yes People in Home: spouse  Medications Reviewed Today: Medications Reviewed Today     Reviewed by Charlyn Minerva, RN (Registered Nurse) on 12/28/22 at 1143  Med List Status: <None>   Medication Order Taking? Sig Documenting Provider Last Dose Status Informant  acetaminophen (TYLENOL) 500 MG tablet [id number] Yes Take 2 tablets (1,000 mg total) by mouth every 8 (eight) hours for 14 days. London Sheer, MD Taking Active   apixaban (ELIQUIS) 5 MG TABS tablet [id number] Yes Take 1 tablet (5 mg total) by mouth 2 (two) times daily. Josph Macho, MD Taking Active Self, Pharmacy Records  atorvastatin (LIPITOR) 20 MG tablet [id number] No Take 1 tablet (20 mg total) by mouth daily.  Patient not taking: Reported on 10/22/2022   Sandford Craze, NP Not Taking Active Self, Pharmacy Records  famotidine  (PEPCID) 20 MG tablet [id number] Yes Take 20 mg by mouth at bedtime. [provider] Taking Active Self, Pharmacy Records  gabapentin (NEURONTIN) 300 MG capsule [id number] Yes Take 1 capsule (300 mg total) by mouth 3 (three) times daily. London Sheer, MD Taking Active Self, Pharmacy Records  levothyroxine (SYNTHROID) 50 MCG tablet [id number] Yes Take 1 tablet (50 mcg total) by mouth daily before breakfast. Sandford Craze, NP Taking Active Self, Pharmacy Records  liothyronine (CYTOMEL) 5 MCG tablet [id number] Yes Take 10 mcg by mouth in the morning. [provider] Taking Active Self, Pharmacy Records  Melatonin 10 MG TABS [id number] Yes Take 20 mg by mouth at bedtime. [provider] Taking Active Self, Pharmacy Records  methocarbamol (ROBAXIN-750) 750 MG tablet [id number] Yes Take 1 tablet (750 mg total) by mouth 4 (four) times daily for 14 days. London Sheer, MD Taking Active   Multiple Vitamin (MULTIVITAMIN WITH MINERALS) TABS tablet [id number] Yes Take 1 tablet by mouth daily. [provider] Taking Active Self, Pharmacy Records  ondansetron (ZOFRAN-ODT) 4 MG disintegrating tablet [id number] Yes Take 1 tablet (4 mg total) by mouth every 8 (eight) hours as needed. Mardene Sayer, MD Taking Active Self, Pharmacy Records  oxyCODONE (OXY IR/ROXICODONE) 5 MG immediate release tablet [id number] Yes Take 1-2 tablets (5-10 mg total) by mouth every 4 (four) hours as needed for up to 7 days for moderate pain or severe pain (pain score 4-10). London Sheer, MD Taking Active   pantoprazole (PROTONIX) 40 MG tablet [id number] Yes Take 40 mg by mouth 2 (two) times daily. [provider] Taking Active Self, Pharmacy Records  polyethylene glycol (MIRALAX / GLYCOLAX) 17 g packet [id number] Yes Take 17 g by mouth  daily for 14 days. London Sheer, MD Taking Active   promethazine (PHENERGAN) 25 MG tablet [id number] Yes Take 1 tablet (25 mg total) by mouth every 8  (eight) hours as needed for nausea or vomiting. Sandford Craze, NP Taking Active Self, Pharmacy Records  senna (SENOKOT) 8.6 MG TABS tablet [id number] Yes Take 1 tablet (8.6 mg total) by mouth 2 (two) times daily for 14 days. London Sheer, MD Taking Active   sertraline (ZOLOFT) 50 MG tablet [id number] Yes TAKE 1 TABLET BY MOUTH DAILY  Patient taking differently: Take 50 mg by mouth at bedtime.   Sandford Craze, NP Taking Active Self, Pharmacy Records  SUMAtriptan [facility]) 100 MG tablet [id number] Yes Take 1 tablet (100 mg total) by mouth at onset of migraine. May repeat in 2 hours if headache persists or recurs. Do not take more than 2 doses in 24 hours. Anson Fret, MD Taking Active Self, Pharmacy Records  traZODone (DESYREL) 100 MG tablet [id number] Yes TAKE TWO TABLETS BY MOUTH EVERY NIGHT AT BEDTIME Sandford Craze, NP Taking Active Self, Pharmacy Records            Home Care and Equipment/Supplies: Were Home Health Services Ordered?: NA Any new equipment or medical supplies ordered?: NA  Functional Questionnaire: Do you need assistance with bathing/showering or dressing?: No Do you need assistance with meal preparation?: No Do you need assistance with eating?: No Do you have difficulty maintaining continence: No Do you need assistance with getting out of bed/getting out of a chair/moving?: No Do you have difficulty managing or taking your medications?: No  Follow up appointments reviewed: PCP Follow-up appointment confirmed?: NA Specialist Hospital Follow-up appointment confirmed?: No Reason Specialist Follow-Up Not Confirmed: Patient has Specialist Provider Number and will Call for Appointment (pt will call office today to make an appt) Do you need transportation to your follow-up appointment?: No (pt aware she is not able to resume driving until medically cleared-she confirms she has transportation to get to appts) Do you understand care options if your  condition(s) worsen?: Yes-patient verbalized understanding  SDOH Interventions Today    Flowsheet Row Most Recent Value  SDOH Interventions   Food Insecurity Interventions Intervention Not Indicated  Transportation Interventions Intervention Not Indicated      .thtnot Interventions Today    Flowsheet Row Most Recent Value  General Interventions   General Interventions Discussed/Reviewed General Interventions Discussed, Doctor Visits  Doctor Visits Discussed/Reviewed Doctor Visits Discussed, PCP, Specialist  PCP/Specialist Visits Compliance with follow-up visit  Education Interventions   Education Provided Provided Education  Provided Verbal Education On Nutrition, Medication, When to see the doctor, Other  [pain/sx mgmt, bowel mgmt]  Nutrition Interventions   Nutrition Discussed/Reviewed Nutrition Discussed, Fluid intake  Pharmacy Interventions   Pharmacy Dicussed/Reviewed Pharmacy Topics Discussed, Medications and their functions  Safety Interventions   Safety Discussed/Reviewed Safety Discussed, Home Safety, Fall Risk       Alessandra Grout [facility] Care Management Care Management Community Coordinator Direct Phone: [phone] Toll Free: [phone] Fax: [phone]

## 2022-12-30 ENCOUNTER — Encounter: Payer: Self-pay | Admitting: Orthopedic Surgery

## 2022-12-30 DIAGNOSIS — Z981 Arthrodesis status: Secondary | ICD-10-CM

## 2022-12-30 MED ORDER — METHOCARBAMOL 750 MG PO TABS: 750.0000 mg | ORAL_TABLET | Freq: Three times a day (TID) | ORAL | 0 refills | Status: DC | PRN

## 2022-12-30 MED ORDER — OXYCODONE HCL 5 MG PO TABS: 5.0000 mg | ORAL_TABLET | ORAL | 0 refills | Status: DC | PRN

## 2022-12-31 ENCOUNTER — Telehealth: Payer: Self-pay | Admitting: Orthopedic Surgery

## 2022-12-31 ENCOUNTER — Telehealth: Payer: Self-pay | Admitting: *Deleted

## 2022-12-31 MED ORDER — SULFAMETHOXAZOLE-TRIMETHOPRIM 800-160 MG PO TABS: 1.0000 | ORAL_TABLET | Freq: Two times a day (BID) | ORAL | 0 refills | Status: AC

## 2022-12-31 MED ORDER — OXYCODONE HCL 5 MG PO TABS: 5.0000 mg | ORAL_TABLET | ORAL | 0 refills | Status: AC | PRN

## 2022-12-31 MED ORDER — METHOCARBAMOL 750 MG PO TABS: 750.0000 mg | ORAL_TABLET | Freq: Three times a day (TID) | ORAL | 0 refills | Status: DC | PRN

## 2022-12-31 NOTE — Telephone Encounter (Signed)
Alvino Chapel from [employer] called and stated that they can not refill for her anymore. They policy is 3 narcotics in a 30 day period and its been 60.Alvino Chapel stated she may need pain management. YQ#[phone]

## 2022-12-31 NOTE — Telephone Encounter (Signed)
I called and spoke with Kim Pham, she states that they have told her that she can not get her pain meds till Sunday, she states that she has enough till Saturday, she asked what she should do, I advised that she needs to stretch out her pain meds till she can get it on Sunday, she states that she can do that

## 2022-12-31 NOTE — Progress Notes (Signed)
Care Coordination   Note   12/31/2022 Name: SINDEE LICHTENBERG MRN: [id number] DOB: December 02, 1980  DELISIA VANALSTINE is a 42 y.o. year old female who sees Sandford Craze, NP for primary care. I reached out to Scheryl Marten by phone today to offer care coordination services.  Ms. Sobie was given information about Care Coordination services today including:   The Care Coordination services include support from the care team which includes your Nurse Coordinator, Clinical Social Worker, or Pharmacist.  The Care Coordination team is here to help remove barriers to the health concerns and goals most important to you. Care Coordination services are voluntary, and the patient may decline or stop services at any time by request to their care team member.   Care Coordination Consent Status: Patient agreed to services and verbal consent obtained.   Follow up plan:  Telephone appointment with care coordination team member scheduled for:  01/12/2023  Encounter Outcome:  Patient Scheduled  Burman Nieves, [facility] Care Coordination Care Guide Direct Dial: [phone]

## 2022-12-31 NOTE — Addendum Note (Signed)
Addended by: Willia Craze on: 12/31/2022 05:39 PM   Modules accepted: Orders

## 2023-01-03 ENCOUNTER — Other Ambulatory Visit (INDEPENDENT_AMBULATORY_CARE_PROVIDER_SITE_OTHER): Payer: BC Managed Care – PPO

## 2023-01-03 ENCOUNTER — Ambulatory Visit (INDEPENDENT_AMBULATORY_CARE_PROVIDER_SITE_OTHER): Payer: BC Managed Care – PPO | Admitting: Orthopedic Surgery

## 2023-01-03 DIAGNOSIS — Z981 Arthrodesis status: Secondary | ICD-10-CM

## 2023-01-03 NOTE — Progress Notes (Signed)
Orthopedic Surgery Post-operative Office Visit  Procedure:  L4/5 right-sided far lateral discectomy L4/5 transfacet far lateral discectomy, L4/5 TLIF, L4/5 PSIF Date of Surgery: 11/08/2022, 12/24/2022 (~7 weeks from index surgery)  Assessment: Patient is a 42 y.o. who is doing well after surgery   Plan: -No operative plans at this time -Told her to let me know if she has persistent drainage, develops fevers, or redness around her incision -Okay to let soap/water run over the incision, do not submerge or use any lotions/creams over the area -Out of bed as tolerated, no brace -No bending/lifting/twisting greater than 10 pounds -Pain management: weaning oxycodone -Tentative plan to return to work on 01/18/2023 -Return to office in 4 weeks, x-rays needed at next visit: AP/lateral lumbar  ___________________________________________________________________________   Subjective: Patient has been doing well since surgery.  She is at home.  She has noted resolution of her radiating leg pain.  She does have some back pain but it has been getting better with time.  She has been weaning her oxycodone use.  She said she is down to taking a 5 mg tablet about 3 times per day.  She still has some decrease sensation over the anterior lateral aspect of the left leg in the LFCN distribution.  No other numbness or paresthesias.  She had 1 episode of urinary incontinence in the middle night but no further episodes.  Has not had any bowel incontinence.  No saddle anesthesia.  She had noticed clear drainage particularly in the mornings at the cranial aspect of the incision.  Has not seen any redness around the incision.  Objective:  General: no acute distress, appropriate affect Neurologic: alert, answering questions appropriately, following commands Respiratory: unlabored breathing on room air Skin: Index surgery incision is well-healed, Midline incision is well-approximated with no erythema, induration,  active/expressible drainage  MSK (spine):  -Strength exam      Left  Right  EHL    5/5  5/5 TA    5/5  5/5 GSC    5/5  5/5 Knee extension  5/5  5/5 Hip flexion   5/5  5/5  -Sensory exam    Sensation intact to light touch in L3-S1 nerve distributions of bilateral lower extremities (decreased in LFCN distribution on the left)  Imaging: X-rays of the lumbar spine taken 01/03/2023 were independently reviewed and interpreted, showing L4 and L5 posterior instrumentation in appropriate position with no lucency.  Screws not backed out.  There is an interbody device in the L4/5 disc space that appears in appropriate position.  The cage is in similar position to immediate postoperative films.   Patient name: Kim Pham Patient MRN: [id number] Date of visit: 01/03/23

## 2023-01-04 ENCOUNTER — Other Ambulatory Visit (HOSPITAL_BASED_OUTPATIENT_CLINIC_OR_DEPARTMENT_OTHER): Payer: Self-pay

## 2023-01-07 ENCOUNTER — Inpatient Hospital Stay: Payer: BC Managed Care – PPO | Admitting: Family

## 2023-01-07 ENCOUNTER — Inpatient Hospital Stay: Payer: BC Managed Care – PPO | Attending: Hematology & Oncology

## 2023-01-10 ENCOUNTER — Other Ambulatory Visit (HOSPITAL_BASED_OUTPATIENT_CLINIC_OR_DEPARTMENT_OTHER): Payer: Self-pay

## 2023-01-11 MED ORDER — OXYCODONE HCL 5 MG PO TABS
5.0000 mg | ORAL_TABLET | ORAL | 0 refills | Status: AC | PRN
Start: 2023-01-11 — End: 2023-01-16

## 2023-01-11 NOTE — Addendum Note (Signed)
Addended by: Willia Craze on: 01/11/2023 05:30 PM   Modules accepted: Orders

## 2023-01-12 ENCOUNTER — Ambulatory Visit: Payer: Self-pay | Admitting: Licensed Clinical Social Worker

## 2023-01-12 NOTE — Patient Outreach (Signed)
Care Coordination   01/12/2023 Name: Kim Pham MRN: [id number] DOB: February 26, 1981   Care Coordination Outreach Attempts:  An unsuccessful telephone outreach was attempted today to offer the patient information about available care coordination services.  Follow Up Plan:  Additional outreach attempts will be made to offer the patient care coordination information and services.   Encounter Outcome:  No Answer   Care Coordination Interventions:  No, not indicated    Kelton Pillar.Carvin Almas MSW, LCSW Licensed Visual merchandiser [facility] Care Management [phone]

## 2023-01-21 ENCOUNTER — Ambulatory Visit (INDEPENDENT_AMBULATORY_CARE_PROVIDER_SITE_OTHER): Payer: BC Managed Care – PPO | Admitting: Family

## 2023-01-21 VITALS — BP 128/77 | HR 89 | Temp 98.6°F | Wt 183.0 lb

## 2023-01-21 DIAGNOSIS — F4321 Adjustment disorder with depressed mood: Secondary | ICD-10-CM | POA: Insufficient documentation

## 2023-01-21 MED ORDER — ALPRAZOLAM 0.5 MG PO TABS: 0.5000 mg | ORAL_TABLET | Freq: Two times a day (BID) | ORAL | 0 refills | Status: DC | PRN

## 2023-01-21 NOTE — Patient Instructions (Signed)
VISIT SUMMARY:  During our visit, we discussed your increased feelings of anxiety and depression following the sudden loss of your mother.   YOUR PLAN:  -ACUTE STRESS REACTION: This is a temporary, intense reaction to a traumatic event, in your case, the sudden loss of your mother. To help manage your symptoms, I've prescribed a short-term medication, Xanax, which you can take as needed for panic attacks or sleep. If your symptoms do not improve, we may need to adjust your Zoloft dosage or consider a long-term controlled substance contract. Please continue with your therapy sessions.  INSTRUCTIONS:  Please take your new medication, Xanax, as needed for panic attacks or sleep. Continue taking your Zoloft as prescribed. If your symptoms do not improve, please check in with me so we can discuss potential adjustments to your treatment plan. Continue with your therapy sessions as well.

## 2023-01-21 NOTE — Assessment & Plan Note (Signed)
Recent unexpected death of mother leading to increased anxiety, depression, decreased appetite, and insomnia. Currently on Zoloft 50mg  daily. -Short term prescription for Xanax 0.5mg  PRN for panic attacks or sleep. Limit to 30 tablets. -If symptoms persist, consider increasing Zoloft dose or establishing a controlled substance contract for longer term use of Xanax. -Continue therapy sessions. (Of note, pt states she is no longer using oxycodone for her back pain).

## 2023-01-21 NOTE — Progress Notes (Signed)
Subjective:     Patient ID: Kim Pham, female    DOB: 10/11/1980, 42 y.o.   MRN: [id number]  Chief Complaint  Patient presents with   Amalia Hailey    Patient informs her mother passed away on 02-07-2023    HPI  Discussed the use of AI scribe software for clinical note transcription with the patient, who gave verbal consent to proceed.  History of Present Illness   The patient, with a history of anxiety and depression managed on Zoloft 50mg , presents with worsening symptoms following the sudden death of her mother. The patient's mother, who lived several hours away, was found deceased at home, likely due to a 'massive stroke' or 'heart attack.' It was believed that she passed on 06-Feb-2023.  Family requested a wellness check on 02-07-23 and she was found deceased at that time.  The patient, a hospice nurse, viewed the body prior to cremation and found no signs of trauma. The patient's mother had a history of a pelvic fracture and was a fall risk. The patient reports increased anxiety, depression, anorexia, and insomnia since the event.  The patient also reports a history of two back surgeries between July and September of the current year, with a good outcome. The patient's husband recently sustained a hand injury, adding to her stress.          Health Maintenance Due  Topic Date Due   INFLUENZA VACCINE  11/18/2022   COVID-19 Vaccine (5 - 2023-24 season) 12/19/2022    Past Medical History:  Diagnosis Date   Acute pulmonary embolism without acute cor pulmonale (HCC) 05/17/2022   ADHD (attention deficit hyperactivity disorder)    Adnexal mass 02/24/2021   in epic seen on [place]   Amenorrhea 08/14/2021   Anxiety 08/14/2021   Arthralgia 08/18/2016   Back pain 09/04/2021   Blood in stool 09/08/2021   Cardiac murmur 12/16/2021   Chest discomfort 04/20/2022   Cholecystitis 05/26/2022   Chondromalacia, patella 06/30/2021   Chronic calculous cholecystitis 06/08/2022   Chronic  constipation 08/14/2021   Chronic headaches HISTORY OF MIGRAINES 08/14/2021   Chronic migraine without aura, with intractable migraine, so stated, with status migrainosus 07/27/2017   Complication of anesthesia 08/14/2021   PONV   Constipation 06/11/2021   Coronary artery calcification seen on CT scan 12/16/2021   Cyst of left ovary 06/17/2021   Degenerative disc disease, lumbar 09/04/2019   Depression with anxiety 03/26/2016   Diarrhea 07/08/2017   DOE (dyspnea on exertion) 04/20/2022   Effusion, left knee 12/16/2021   Endometriosis    Family history of BRCA gene mutation 06/17/2021   [facility] genetic testing done 06/2021 - negative for breast cancer genes.   Family history of breast cancer 09/09/2015   Formatting of this note might be different from the original.  Mom dx'd age 36  Mat aunt dx'd age 63  Neither relative tested for BRCA  Formatting of this note might be different from the original.  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)    High risk medication use 11/22/2016   history of Chiari malformation    History of Chiari malformation 11/22/2016   history of COVID 02/2021   LOW GRADE FEVER CONGESTION AND HEADACHE ALL SYMPTOMS RESOLVED   Hot flashes 06/11/2021   Hyperlipidemia  Hypertension    Insomnia 08/19/2020   Low blood pressure    pt states history of low blood pressure-fainted 12/2010-instructed to move slowly   Nipple discharge 05/19/2022   Nonepileptic episode (HCC) 12/22/2016   Numbness and tingling 02/25/2016   Obesity (BMI 30.0-34.9) 12/16/2021   On continuous oral anticoagulation 05/17/2022   OSTEOARTHRITIS 08/14/2021   Other epilepsy, intractable, without status epilepticus (HCC) 11/01/2016   Pain in right knee 11/13/2020   Pain in unspecified joint  08/18/2016   Palpitations 12/15/2021   Pelvic pain in female 08/18/2021   Photosensitivity 11/22/2016   PONV (postoperative nausea and vomiting)    Preventative health care 09/08/2021   Psychogenic nonepileptic seizure 07/27/2017   Pulmonary embolism (HCC)    Raynaud's disease without gangrene 11/22/2016   Rectal bleeding 06/11/2021   RUQ pain 05/07/2022   Sacroiliac joint dysfunction of right side 12/19/2019   Sicca syndrome (HCC) 11/22/2016   SLE (systemic lupus erythematosus) (HCC) 03/26/2016   Status post laparoscopic cholecystectomy 06/08/2022   Subclinical hypothyroidism    Thyroid nodule    Transient alteration of awareness 01/01/2016   Vasomotor symptoms due to menopause 06/17/2021   Voice hoarseness 05/07/2022   Wears glasses for reading 08/14/2021    Past Surgical History:  Procedure Laterality Date   ABDOMINAL HYSTERECTOMY  2007   CHOLECYSTECTOMY N/A 06/08/2022   Procedure: LAPAROSCOPIC CHOLECYSTECTOMY WITH ICG;  Surgeon: Almond Lint, MD;  Location: [facility];  Service: General;  Laterality: N/A;   CYSTOSCOPY  2007   WITH HYSTERECTOMY   DIAGNOSTIC LAPAROSCOPY  2005   LUMBAR LAMINECTOMY/DECOMPRESSION MICRODISCECTOMY Left 11/08/2022   Procedure: L4-5 FAR LEFT LATERAL DISCECTOMY;  Surgeon: London Sheer, MD;  Location: [facility];  Service: Orthopedics;  Laterality: Left;   OOPHORECTOMY Left 08/18/2021   ROTATOR CUFF REPAIR Right 2010   SALPINGOOPHORECTOMY  04/16/2011   Procedure: SALPINGO OOPHERECTOMY;  Surgeon: Lenoard Aden, MD;  Location: [facility];  Service: Gynecology;  Laterality: Right;   skull decompression surgery 2005     @ [facility]    Family History  Problem Relation Age of Onset   Hypertension Mother    CAD Mother    Breast cancer Mother    Migraines Mother    Gout Mother    Heart disease Mother    Crohn's disease Mother    Hypertension Father    CAD Father    CVA Father    Heart disease Father    Colon polyps Father    Liver disease Father     Sjogren's syndrome Sister    Celiac disease Sister    Rheum arthritis Maternal Grandmother    Dementia Maternal Grandmother    Pancreatic cancer Maternal Grandfather    CAD Paternal Grandmother    Macular degeneration Paternal Grandmother    CVA Paternal Grandfather    Autism Son    ADD / ADHD Son    Breast cancer Maternal Aunt    Esophageal cancer Neg Hx    Colon cancer Neg Hx    Stomach cancer Neg Hx     Social History   Socioeconomic History   Marital status: Married    Spouse name: Not on file   Number of children: 1   Years of education: Assoc   Highest education level: Not on file  Occupational History   Occupation: nurse  Tobacco Use   Smoking status: Former    Current packs/day: 0.00    Average packs/day: 1 pack/day for 10.0 years (10.0 ttl pk-yrs)  Types: Cigarettes    Start date: 04/04/1994    Quit date: 04/04/2004    Years since quitting: 18.8    Passive exposure: Past   Smokeless tobacco: Never  Vaping Use   Vaping status: Never Used  Substance and Sexual Activity   Alcohol use: Not Currently   Drug use: Never   Sexual activity: Yes    Birth control/protection: Surgical  Other Topics Concern   Not on file  Social History Narrative   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   Married   1 son- born 2004 has autism   Enjoys exercise, special olympics with son   Drinks 4-5 cups of coffee a day       Right handed   Social Determinants of Health   Financial Resource Strain: Medium Risk (09/14/2022)   Received from [facility], [facility]   Overall Financial Resource Strain (CARDIA)    Difficulty of Paying Living Expenses: Somewhat hard  Food Insecurity: No Food Insecurity (12/28/2022)   Hunger Vital Sign    Worried About Running Out of Food in the Last Year: Never true    Ran Out of Food in the Last Year: Never true  Transportation Needs: No Transportation Needs (12/28/2022)   PRAPARE - Administrator, Civil Service (Medical): No    Lack of  Transportation (Non-Medical): No  Physical Activity: Insufficiently Active (09/14/2022)   Received from [facility], [facility]   Exercise Vital Sign    Days of Exercise per Week: 3 days    Minutes of Exercise per Session: 30 min  Stress: Stress Concern Present (09/14/2022)   Received from [facility], [facility] - Occupational Stress Questionnaire    Feeling of Stress : To some extent  Social Connections: Socially Integrated (09/14/2022)   [facility] from [facility], [facility]   Social Network    How would you rate your social network (family, work, friends)?: Good participation with social networks  Intimate Partner Violence: Not At Risk (12/24/2022)   Humiliation, Afraid, Rape, and Kick questionnaire    Fear of Current or Ex-Partner: No    Emotionally Abused: No    Physically Abused: No    Sexually Abused: No    Outpatient Medications Prior to Visit  Medication Sig Dispense Refill   apixaban (ELIQUIS) 5 MG TABS tablet Take 1 tablet (5 mg total) by mouth 2 (two) times daily. 60 tablet 2   atorvastatin (LIPITOR) 20 MG tablet Take 1 tablet (20 mg total) by mouth daily. 90 tablet 1   famotidine (PEPCID) 20 MG tablet Take 20 mg by mouth at bedtime.     gabapentin (NEURONTIN) 300 MG capsule Take 1 capsule (300 mg total) by mouth 3 (three) times daily. 90 capsule 2   levothyroxine (SYNTHROID) 50 MCG tablet Take 1 tablet (50 mcg total) by mouth daily before breakfast. 90 tablet 0   liothyronine (CYTOMEL) 5 MCG tablet Take 10 mcg by mouth in the morning.     Melatonin 10 MG TABS Take 20 mg by mouth at bedtime.     methocarbamol (ROBAXIN-750) 750 MG tablet Take 1 tablet (750 mg total) by mouth every 8 (eight) hours as needed (muscle spasms, pain). 50 tablet 0   Multiple Vitamin (MULTIVITAMIN WITH MINERALS) TABS tablet Take 1 tablet by mouth daily.     ondansetron (ZOFRAN-ODT) 4 MG disintegrating tablet Take 1 tablet (4 mg total) by mouth  every 8 (eight) hours as needed. 20  tablet 0   pantoprazole (PROTONIX) 40 MG tablet Take 40 mg by mouth 2 (two) times daily.     promethazine (PHENERGAN) 25 MG tablet Take 1 tablet (25 mg total) by mouth every 8 (eight) hours as needed for nausea or vomiting. 30 tablet 1   sertraline (ZOLOFT) 50 MG tablet TAKE 1 TABLET BY MOUTH DAILY (Patient taking differently: Take 50 mg by mouth at bedtime.) 90 tablet 1   SUMAtriptan (IMITREX) 100 MG tablet Take 1 tablet (100 mg total) by mouth at onset of migraine. May repeat in 2 hours if headache persists or recurs. Do not take more than 2 doses in 24 hours. 10 tablet 2   traZODone (DESYREL) 100 MG tablet TAKE TWO TABLETS BY MOUTH EVERY NIGHT AT BEDTIME 180 tablet 1   No facility-administered medications prior to visit.    Allergies  Allergen Reactions   Naltrexone Nausea Only   Rosuvastatin Nausea Only   Tape Itching and Other (See Comments)    Skin itches if tape is left on for awhile   Tramadol Nausea And Vomiting   Effexor [Venlafaxine] Nausea Only and Rash   Norco [Hydrocodone-Acetaminophen] Rash   Voltaren [Diclofenac] Rash    ROS    See HPI Objective:    Physical Exam Constitutional:      Appearance: Normal appearance.  Pulmonary:     Effort: Pulmonary effort is normal.  Neurological:     Mental Status: She is alert.  Psychiatric:        Attention and Perception: Attention normal.        Behavior: Behavior normal.        Thought Content: Thought content normal.        Cognition and Memory: Cognition normal.        Judgment: Judgment normal.     Comments: tearful      BP 128/77 (BP Location: Right Arm, Patient Position: Sitting, Cuff Size: Normal)   Pulse 89   Temp 98.6 F (37 C) (Oral)   Wt 183 lb (83 kg)   SpO2 99%   BMI 27.02 kg/m  Wt Readings from Last 3 Encounters:  01/21/23 183 lb (83 kg)  12/23/22 182 lb (82.6 kg)  12/15/22 182 lb 1.9 oz (82.6 kg)       Assessment & Plan:   Problem List Items Addressed  This Visit       Unprioritized   Grief reaction - Primary    Recent unexpected death of mother leading to increased anxiety, depression, decreased appetite, and insomnia. Currently on Zoloft 50mg  daily. -Short term prescription for Xanax 0.5mg  PRN for panic attacks or sleep. Limit to 30 tablets. -If symptoms persist, consider increasing Zoloft dose or establishing a controlled substance contract for longer term use of Xanax. -Continue therapy sessions. (Of note, pt states she is no longer using oxycodone for her back pain).       I am having Boston Schied. Abbett start on ALPRAZolam. I am also having her maintain her SUMAtriptan, Melatonin, liothyronine, pantoprazole, multivitamin with minerals, atorvastatin, ondansetron, sertraline, famotidine, promethazine, traZODone, levothyroxine, gabapentin, apixaban, and methocarbamol.  Meds ordered this encounter  Medications   ALPRAZolam (XANAX) 0.5 MG tablet    Sig: Take 1 tablet (0.5 mg total) by mouth 2 (two) times daily as needed for anxiety (anxiety/insomnia).    Dispense:  30 tablet    Refill:  0    Order Specific Question:   Supervising Provider    Answer:   Danise Edge A [[id number]]

## 2023-01-23 ENCOUNTER — Other Ambulatory Visit: Payer: Self-pay | Admitting: Family

## 2023-02-01 ENCOUNTER — Other Ambulatory Visit (HOSPITAL_COMMUNITY)
Admission: RE | Admit: 2023-02-01 | Discharge: 2023-02-01 | Disposition: A | Discharge status: Home / Self Care | Payer: BC Managed Care – PPO | Source: Ambulatory Visit | Attending: Oncology | Admitting: Oncology

## 2023-02-01 DIAGNOSIS — Z006 Encounter for examination for normal comparison and control in clinical research program: Secondary | ICD-10-CM | POA: Insufficient documentation

## 2023-02-02 ENCOUNTER — Other Ambulatory Visit (INDEPENDENT_AMBULATORY_CARE_PROVIDER_SITE_OTHER): Payer: BC Managed Care – PPO

## 2023-02-02 ENCOUNTER — Ambulatory Visit (INDEPENDENT_AMBULATORY_CARE_PROVIDER_SITE_OTHER): Payer: BC Managed Care – PPO | Admitting: Orthopedic Surgery

## 2023-02-02 DIAGNOSIS — Z981 Arthrodesis status: Secondary | ICD-10-CM | POA: Diagnosis not present

## 2023-02-02 NOTE — Progress Notes (Signed)
Orthopedic Surgery Post-operative Office Visit   Procedure:  L4/5 right-sided far lateral discectomy L4/5 transfacet far lateral discectomy, L4/5 TLIF, L4/5 PSIF Date of Surgery: 11/08/2022, 12/24/2022 (~6 weeks from second surgery)   Assessment: Patient is a 42 y.o. who has had resolution of her right leg pain since surgery.  Still has symptoms of LFCN injury from positioning on the left side     Plan: -Operative plans complete -Okay to submerge wound at this point -Out of bed as tolerated, no brace -No bending/lifting/twisting greater than 10 pounds -Told her we will continue to monitor the LFCN as it can take some time to recover.  Explained to her desensitization that she can perform at home to help with some of her symptoms -Pain management: OTC medications -Return to office in 6 weeks, x-rays needed at next visit: AP/lateral/flex/ex lumbar   ___________________________________________________________________________     Subjective: Patient has been doing well since surgery.  Her right leg pain has completely resolved.  She does have sensitivity on the lateral aspect of the left thigh.  The numbness in that area has resolved.  She has no other pain in her left lower extremity.  She has not noticed any redness or drainage around her surgical incision.   Objective:   General: no acute distress, appropriate affect Neurologic: alert, answering questions appropriately, following commands Respiratory: unlabored breathing on room air Skin: Index surgery incision is well-healed, Midline incision is well-approximated with no erythema, induration, active/expressible drainage   MSK (spine):   -Strength exam                                                   Left                  Right   EHL                              5/5                  5/5 TA                                 5/5                  5/5 GSC                             5/5                  5/5 Knee extension             5/5                  5/5 Hip flexion                    5/5                  5/5   -Sensory exam                           Sensation intact to light touch in L3-S1 nerve distributions of bilateral lower extremities   Imaging:  X-rays of the lumbar spine taken 02/02/2023 were independently reviewed and interpreted, showing L4 and L5 posterior instrumentation in appropriate position with no lucency. Interbody cage at L4/5 appears in appropriate position. There has been no significant change in the position of the cage or screws since films on 01/03/2023.     Patient name: Kim Pham Patient MRN: [id number] Date of visit: 02/02/23

## 2023-02-07 ENCOUNTER — Encounter (HOSPITAL_BASED_OUTPATIENT_CLINIC_OR_DEPARTMENT_OTHER): Payer: Self-pay

## 2023-02-07 ENCOUNTER — Other Ambulatory Visit (HOSPITAL_BASED_OUTPATIENT_CLINIC_OR_DEPARTMENT_OTHER): Payer: Self-pay

## 2023-02-09 ENCOUNTER — Encounter: Payer: Self-pay | Admitting: Behavioral Health

## 2023-02-09 ENCOUNTER — Telehealth: Payer: Self-pay | Admitting: *Deleted

## 2023-02-09 ENCOUNTER — Ambulatory Visit: Payer: BC Managed Care – PPO | Admitting: Behavioral Health

## 2023-02-09 DIAGNOSIS — F411 Generalized anxiety disorder: Secondary | ICD-10-CM | POA: Diagnosis not present

## 2023-02-09 DIAGNOSIS — F331 Major depressive disorder, recurrent, moderate: Secondary | ICD-10-CM

## 2023-02-09 DIAGNOSIS — F4321 Adjustment disorder with depressed mood: Secondary | ICD-10-CM

## 2023-02-09 NOTE — Progress Notes (Signed)
Care Coordination Note  02/09/2023 Name: GRABIELA MITTELSTEADT MRN: [id number] DOB: 08-30-1980  Kim Pham is a 42 y.o. year old female who is a primary care patient of Sandford Craze, NP and is actively engaged with the care management team. I reached out to Scheryl Marten by phone today to assist with re-scheduling a follow up visit with the Licensed Clinical Social Worker  Follow up plan: Patient declines further follow up and engagement by the care management team. Appropriate care team members and provider have been notified via electronic communication.   [facility]  Care Coordination Care Guide  Direct Dial: [phone]

## 2023-02-09 NOTE — Progress Notes (Signed)
Care Coordination Note  02/09/2023 Name: Kim Pham MRN: [id number] DOB: 1980-09-27  Kim Pham is a 42 y.o. year old female who is a primary care patient of Sandford Craze, NP and is actively engaged with the care management team. I reached out to Scheryl Marten by phone today to assist with re-scheduling a follow up visit with the Licensed Clinical Social Worker  Follow up plan: Unsuccessful telephone outreach attempt made. A HIPAA compliant phone message was left for the patient providing contact information and requesting a return call.  [facility]  Care Coordination Care Guide  Direct Dial: [phone]

## 2023-02-09 NOTE — Progress Notes (Addendum)
[facility] Counselor/Therapist Progress Note  Patient ID: Kim Pham, MRN: [id number],    Date: 02/09/2023  Time Spent: 36 minutes, 8:20 AM until 8:56 AMThis session was held via video teletherapy. The patient consented to the video teletherapy and was located in her home during this session. She is aware it is the responsibility of the patient to secure confidentiality on her end of the session. The provider was in a private home office for the duration of this session.      Treatment Type: Individual Therapy  Reported Symptoms: Anxiety/stress  Mental Status Exam: Appearance:  Well Groomed     Behavior: Appropriate  Motor: Normal  Speech/Language:  Clear and Coherent  Affect: Appropriate  Mood: normal  Thought process: normal  Thought content:   WNL  Sensory/Perceptual disturbances:   WNL  Orientation: oriented to person, place, time/date, situation, day of week, and month of year  Attention: Good  Concentration: Good  Memory: WNL  Fund of knowledge:  Good  Insight:   Good  Judgment:  Good  Impulse Control: Good   Risk Assessment: Danger to Self:  No Self-injurious Behavior: No Danger to Others: No Duty to Warn:no Physical Aggression / Violence:No  Access to Firearms a concern: No  Gang Involvement:No   Subjective: I had not seen the patient in several months.  At her last session she was in significant back pain.  She did have lumbar back surgery in approximately 1 week after got sick and was vomiting and damaged the surgery that was done.  They then had to go back and do fusion with L4 and L5 of with a cage and screws.  That was very difficult and painful for the patient but she is starting to see significant improvement and reduction in pain.  She does get tired very easily.  During her cane Janann August her son who is on the autism spectrum was upset by the storm so they had to help him out and called for him that those things are back to normal now.  3  weeks ago her mother died unexpectedly.  She spoke to her every couple of days and had spoken to her about 2 days before her death.  When she had not heard from her mother she called law enforcement in the area where her mother lived and they found her dead on the kitchen floor and they think it was a massive heart attack.  That was completely unexpected and she is still grieving.  She and her mother had gotten into a better place in the relationship.  She and her siblings have been working through all of this together but her aunt who has power of attorney for her mother is creating some issues which the patient feels that may have to hire an attorney for her.  That is just complicated grief.  As a hospice nurse the patient understands grief and feels that she is grieving in a healthy way but says there are very good days and very bad days.  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  After finding out her mother was dead she drank some alcohol which she had not done in 2 years.  She has been wrestling intellectually and emotionally with whether or not she feels she can drink responsibly but is concerned about that so she has decided to take a 30-day reset and see where she is in terms of wanting to drink alcohol.  She says that her husband  is great accountability partner for her and that and I will be so also.  She is still smoking but says she does not feel she can give up both now but will meet with her doctor in the next few days to discuss options for helping her stop smoking.  She does contract for safety having no thoughts of hurting herself or anyone else.  Interventions: Cognitive Behavioral Therapy  Diagnosis: Generalized anxiety disorder  Plan: I will meet with the patient every 2 weeks virtually Target date: January 17, 2023 Progress: 30%  The patient has had 2 surgeries as well as her mom's death so had not seen her in several months.  I did review the target goals and a new  target date for continuing above-stated goals is July 18, 2023. [name], [facility]                 [name], [facility]               [name], [facility]               [name], [facility]

## 2023-02-11 ENCOUNTER — Encounter: Payer: Self-pay | Admitting: Family

## 2023-02-11 LAB — HELIX MOLECULAR SCREEN: Genetic Analysis Overall Interpretation: NEGATIVE

## 2023-02-13 MED ORDER — ALPRAZOLAM 0.5 MG PO TABS: 0.5000 mg | ORAL_TABLET | Freq: Two times a day (BID) | ORAL | 0 refills | Status: DC | PRN

## 2023-02-15 ENCOUNTER — Encounter: Payer: Self-pay | Admitting: Family

## 2023-02-15 ENCOUNTER — Ambulatory Visit (INDEPENDENT_AMBULATORY_CARE_PROVIDER_SITE_OTHER): Payer: BC Managed Care – PPO | Admitting: Family

## 2023-02-15 VITALS — BP 100/58 | HR 87 | Temp 98.8°F | Resp 16 | Ht 69.0 in | Wt 185.0 lb

## 2023-02-15 DIAGNOSIS — F418 Other specified anxiety disorders: Secondary | ICD-10-CM

## 2023-02-15 DIAGNOSIS — R002 Palpitations: Secondary | ICD-10-CM

## 2023-02-15 DIAGNOSIS — F419 Anxiety disorder, unspecified: Secondary | ICD-10-CM

## 2023-02-15 DIAGNOSIS — D649 Anemia, unspecified: Secondary | ICD-10-CM

## 2023-02-15 DIAGNOSIS — L729 Follicular cyst of the skin and subcutaneous tissue, unspecified: Secondary | ICD-10-CM

## 2023-02-15 DIAGNOSIS — Z0000 Encounter for general adult medical examination without abnormal findings: Secondary | ICD-10-CM | POA: Diagnosis not present

## 2023-02-15 DIAGNOSIS — R232 Flushing: Secondary | ICD-10-CM | POA: Diagnosis not present

## 2023-02-15 DIAGNOSIS — M51369 Other intervertebral disc degeneration, lumbar region without mention of lumbar back pain or lower extremity pain: Secondary | ICD-10-CM

## 2023-02-15 DIAGNOSIS — N83202 Unspecified ovarian cyst, left side: Secondary | ICD-10-CM | POA: Diagnosis not present

## 2023-02-15 DIAGNOSIS — I2699 Other pulmonary embolism without acute cor pulmonale: Secondary | ICD-10-CM | POA: Diagnosis not present

## 2023-02-15 DIAGNOSIS — R739 Hyperglycemia, unspecified: Secondary | ICD-10-CM

## 2023-02-15 DIAGNOSIS — E041 Nontoxic single thyroid nodule: Secondary | ICD-10-CM | POA: Diagnosis not present

## 2023-02-15 DIAGNOSIS — E039 Hypothyroidism, unspecified: Secondary | ICD-10-CM | POA: Diagnosis not present

## 2023-02-15 DIAGNOSIS — E038 Other specified hypothyroidism: Secondary | ICD-10-CM

## 2023-02-15 DIAGNOSIS — M1990 Unspecified osteoarthritis, unspecified site: Secondary | ICD-10-CM

## 2023-02-15 LAB — COMPREHENSIVE METABOLIC PANEL
ALT: 38 U/L — ABNORMAL HIGH (ref 0–35)
AST: 32 U/L (ref 0–37)
Albumin: 4.3 g/dL (ref 3.5–5.2)
Alkaline Phosphatase: 114 U/L (ref 39–117)
BUN: 12 mg/dL (ref 6–23)
CO2: 30 meq/L (ref 19–32)
Calcium: 9.7 mg/dL (ref 8.4–10.5)
Chloride: 103 meq/L (ref 96–112)
Creatinine, Ser: 0.76 mg/dL (ref 0.40–1.20)
GFR: 96.37 mL/min (ref >60.00)
Glucose, Bld: 87 mg/dL (ref 70–99)
Potassium: 4.5 meq/L (ref 3.5–5.1)
Sodium: 140 meq/L (ref 135–145)
Total Bilirubin: 0.3 mg/dL (ref 0.2–1.2)
Total Protein: 6.6 g/dL (ref 6.0–8.3)

## 2023-02-15 LAB — CBC WITH DIFFERENTIAL/PLATELET
Basophils Absolute: 0.1 10*3/uL (ref 0.0–0.1)
Basophils Relative: 1.1 % (ref 0.0–3.0)
Eosinophils Absolute: 0.1 10*3/uL (ref 0.0–0.7)
Eosinophils Relative: 2.5 % (ref 0.0–5.0)
HCT: 41.2 % (ref 36.0–46.0)
Hemoglobin: 13.2 g/dL (ref 12.0–15.0)
Lymphocytes Relative: 29.8 % (ref 12.0–46.0)
Lymphs Abs: 1.5 10*3/uL (ref 0.7–4.0)
MCHC: 32.1 g/dL (ref 30.0–36.0)
MCV: 88.8 fL (ref 78.0–100.0)
Monocytes Absolute: 0.3 10*3/uL (ref 0.1–1.0)
Monocytes Relative: 5.8 % (ref 3.0–12.0)
Neutro Abs: 3 10*3/uL (ref 1.4–7.7)
Neutrophils Relative %: 60.8 % (ref 43.0–77.0)
Platelets: 323 10*3/uL (ref 150.0–400.0)
RBC: 4.64 Mil/uL (ref 3.87–5.11)
RDW: 13.5 % (ref 11.5–15.5)
WBC: 5 10*3/uL (ref 4.0–10.5)

## 2023-02-15 LAB — B12 AND FOLATE PANEL
Folate: 8.1 ng/mL (ref >5.9)
Vitamin B-12: 266 pg/mL (ref 211–911)

## 2023-02-15 LAB — T3, FREE: T3, Free: 3.2 pg/mL (ref 2.3–4.2)

## 2023-02-15 LAB — HEMOGLOBIN A1C: Hgb A1c MFr Bld: 5.5 % (ref 4.6–6.5)

## 2023-02-15 LAB — T4, FREE: Free T4: 0.7 ng/dL (ref 0.60–1.60)

## 2023-02-15 LAB — TSH: TSH: 0.81 u[IU]/mL (ref 0.35–5.50)

## 2023-02-15 MED ORDER — SERTRALINE HCL 50 MG PO TABS: 75.0000 mg | ORAL_TABLET | Freq: Every day | ORAL | 1 refills | Status: DC

## 2023-02-15 NOTE — Assessment & Plan Note (Addendum)
EKG tracing is personally reviewed.  EKG notes NSR.  No acute changes.  Had normal heart monitor with cardiology last year. Monitor.

## 2023-02-15 NOTE — Progress Notes (Signed)
Subjective:     Patient ID: Kim Pham, female    DOB: 1980/05/21, 42 y.o.   MRN: [id number]  Chief Complaint  Patient presents with   Annual Exam         HPI  Discussed the use of AI scribe software for clinical note transcription with the patient, who gave verbal consent to proceed.  History of Present Illness    Patient presents today for a complete physical. She has a history of anxiety and a clotting disorder and presents with palpitations described as a 'deep beat, almost like it's skipping a beat.' The palpitations are associated with breathlessness and are sporadic, 'comes out of nowhere.' The patient reports a particularly severe episode where she experienced intense chest pounding, sweating, and dyspnea. She was unsure if this was a severe panic attack or a cardiac event. The patient's mother recently died of a heart attack, which has heightened her anxiety. She denies excessive caffeine intake.  The patient is currently on sertraline for anxiety and Synthroid + Cytomel for hypothyroidism. She has been prescribed a refill for Xanax for acute anxiety episodes, but has not picked up the prescription yet. She also takes trazodone as needed for sleep.     Immunizations: Diet: working on improving her diet Wt Readings from Last 3 Encounters:  02/15/23 185 lb (83.9 kg)  01/21/23 183 lb (83 kg)  12/23/22 182 lb (82.6 kg)    Exercise: regular walking Pap Smear: s/p hysterectomy Mammogram: up to date, will need diagnostic repeat left in January Vision: up to date Dental: up to date   Health Maintenance Due  Topic Date Due   COVID-19 Vaccine (5 - 2023-24 season) 12/19/2022    Past Medical History:  Diagnosis Date   Acute pulmonary embolism without acute cor pulmonale (HCC) 05/17/2022   ADHD (attention deficit hyperactivity disorder)    Adnexal mass 02/24/2021   in epic seen on [place]   Amenorrhea 08/14/2021   Anxiety 08/14/2021   Arthralgia 08/18/2016   Back  pain 09/04/2021   Blood in stool 09/08/2021   Cardiac murmur 12/16/2021   Chest discomfort 04/20/2022   Cholecystitis 05/26/2022   Chondromalacia, patella 06/30/2021   Chronic calculous cholecystitis 06/08/2022   Chronic constipation 08/14/2021   Chronic headaches HISTORY OF MIGRAINES 08/14/2021   Chronic migraine without aura, with intractable migraine, so stated, with status migrainosus 07/27/2017   Complication of anesthesia 08/14/2021   PONV   Constipation 06/11/2021   Coronary artery calcification seen on CT scan 12/16/2021   Cyst of left ovary 06/17/2021   Degenerative disc disease, lumbar 09/04/2019   Depression with anxiety 03/26/2016   Diarrhea 07/08/2017   DOE (dyspnea on exertion) 04/20/2022   Effusion, left knee 12/16/2021   Endometriosis    Family history of BRCA gene mutation 06/17/2021   [facility] genetic testing done 06/2021 - negative for breast cancer genes.   Family history of breast cancer 09/09/2015   Formatting of this note might be different from the original.  Mom dx'd age 54  Mat aunt dx'd age 61  Neither relative tested for BRCA  Formatting of this note might be different from the original.  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 (HCC)    High risk medication use 11/22/2016   history of Chiari  malformation    History of Chiari malformation 11/22/2016   history of COVID 02/2021   LOW GRADE FEVER CONGESTION AND HEADACHE ALL SYMPTOMS RESOLVED   Hot flashes 06/11/2021   Hyperlipidemia    Hypertension    Insomnia 08/19/2020   Low blood pressure    pt states history of low blood pressure-fainted 12/2010-instructed to move slowly   Nipple discharge 05/19/2022   Nonepileptic episode (HCC) 12/22/2016   Numbness and tingling 02/25/2016   Obesity (BMI 30.0-34.9)  12/16/2021   On continuous oral anticoagulation 05/17/2022   OSTEOARTHRITIS 08/14/2021   Other epilepsy, intractable, without status epilepticus (HCC) 11/01/2016   Pain in right knee 11/13/2020   Pain in unspecified joint 08/18/2016   Palpitations 12/15/2021   Pelvic pain in female 08/18/2021   Photosensitivity 11/22/2016   PONV (postoperative nausea and vomiting)    Preventative health care 09/08/2021   Psychogenic nonepileptic seizure 07/27/2017   Pulmonary embolism (HCC)    Raynaud's disease without gangrene 11/22/2016   Rectal bleeding 06/11/2021   RUQ pain 05/07/2022   Sacroiliac joint dysfunction of right side 12/19/2019   Sicca syndrome (HCC) 11/22/2016   SLE (systemic lupus erythematosus) (HCC) 03/26/2016   Status post laparoscopic cholecystectomy 06/08/2022   Subclinical hypothyroidism    Thyroid nodule    Transient alteration of awareness 01/01/2016   Vasomotor symptoms due to menopause 06/17/2021   Voice hoarseness 05/07/2022   Wears glasses for reading 08/14/2021    Past Surgical History:  Procedure Laterality Date   ABDOMINAL HYSTERECTOMY  2007   CHOLECYSTECTOMY N/A 06/08/2022   Procedure: LAPAROSCOPIC CHOLECYSTECTOMY WITH ICG;  Surgeon: Almond Lint, MD;  Location: [facility];  Service: General;  Laterality: N/A;   CYSTOSCOPY  2007   WITH HYSTERECTOMY   DIAGNOSTIC LAPAROSCOPY  2005   LUMBAR LAMINECTOMY/DECOMPRESSION MICRODISCECTOMY Left 11/08/2022   Procedure: L4-5 FAR LEFT LATERAL DISCECTOMY;  Surgeon: London Sheer, MD;  Location: [facility] OR;  Service: Orthopedics;  Laterality: Left;   OOPHORECTOMY Left 08/18/2021   ROTATOR CUFF REPAIR Right 2010   SALPINGOOPHORECTOMY  04/16/2011   Procedure: SALPINGO OOPHERECTOMY;  Surgeon: Lenoard Aden, MD;  Location: [facility];  Service: Gynecology;  Laterality: Right;   skull decompression surgery 2005     @ [facility]    Family History  Problem Relation Age of Onset   Hypertension Mother    CAD Mother    Breast cancer  Mother    Migraines Mother    Gout Mother    Heart disease Mother    Crohn's disease Mother    Hypertension Father    CAD Father    CVA Father    Heart disease Father    Colon polyps Father    Liver disease Father    Sjogren's syndrome Sister    Celiac disease Sister    Rheum arthritis Maternal Grandmother    Dementia Maternal Grandmother    Pancreatic cancer Maternal Grandfather    CAD Paternal Grandmother    Macular degeneration Paternal Grandmother    CVA Paternal Grandfather    Autism Son    ADD / ADHD Son    Breast cancer Maternal Aunt    Esophageal cancer Neg Hx    Colon cancer Neg Hx    Stomach cancer Neg Hx     Social History   Socioeconomic History   Marital status: Married    Spouse name: Not on file   Number of children: 1   Years of education: Assoc   Highest education level: Not  on file  Occupational History   Occupation: nurse  Tobacco Use   Smoking status: Former    Current packs/day: 0.00    Average packs/day: 1 pack/day for 10.0 years (10.0 ttl pk-yrs)    Types: Cigarettes    Start date: 04/04/1994    Quit date: 04/04/2004    Years since quitting: 18.8    Passive exposure: Past   Smokeless tobacco: Never  Vaping Use   Vaping status: Never Used  Substance and Sexual Activity   Alcohol use: Not Currently   Drug use: Never   Sexual activity: Yes    Birth control/protection: Surgical  Other Topics Concern   Not on file  Social History Narrative   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   Married   1 son- born 2004 has autism   Enjoys exercise, special olympics with son   Drinks 4-5 cups of coffee a day       Right handed   Social Determinants of Health   Financial Resource Strain: Medium Risk (09/14/2022)   Received from [facility], [facility]   Overall Financial Resource Strain (CARDIA)    Difficulty of Paying Living Expenses: Somewhat hard  Food Insecurity: No Food Insecurity (12/28/2022)   Hunger Vital Sign    Worried About Running Out of  Food in the Last Year: Never true    Ran Out of Food in the Last Year: Never true  Transportation Needs: No Transportation Needs (12/28/2022)   PRAPARE - Administrator, Civil Service (Medical): No    Lack of Transportation (Non-Medical): No  Physical Activity: Insufficiently Active (09/14/2022)   Received from [facility], [facility]   Exercise Vital Sign    Days of Exercise per Week: 3 days    Minutes of Exercise per Session: 30 min  Stress: Stress Concern Present (09/14/2022)   Received from [facility], [facility] - Occupational Stress Questionnaire    Feeling of Stress : To some extent  Social Connections: Socially Integrated (09/14/2022)   Received from [facility], [facility]   Social Network    How would you rate your social network (family, work, friends)?: Good participation with social networks  Intimate Partner Violence: Not At Risk (12/24/2022)   Humiliation, Afraid, Rape, and Kick questionnaire    Fear of Current or Ex-Partner: No    Emotionally Abused: No    Physically Abused: No    Sexually Abused: No    Outpatient Medications Prior to Visit  Medication Sig Dispense Refill   apixaban (ELIQUIS) 5 MG TABS tablet Take 1 tablet (5 mg total) by mouth 2 (two) times daily. 60 tablet 2   atorvastatin (LIPITOR) 20 MG tablet TAKE 1 TABLET BY MOUTH DAILY 90 tablet 1   famotidine (PEPCID) 20 MG tablet Take 20 mg by mouth at bedtime.     gabapentin (NEURONTIN) 300 MG capsule Take 1 capsule (300 mg total) by mouth 3 (three) times daily. 90 capsule 2   levothyroxine (SYNTHROID) 50 MCG tablet Take 1 tablet (50 mcg total) by mouth daily before breakfast. 90 tablet 0   liothyronine (CYTOMEL) 5 MCG tablet Take 10 mcg by mouth in the morning.     Melatonin 10 MG TABS Take 20 mg by mouth at bedtime.     Multiple Vitamin (MULTIVITAMIN WITH MINERALS) TABS tablet Take 1 tablet by mouth daily.     ondansetron (ZOFRAN-ODT) 4 MG  disintegrating tablet Take 1 tablet (4 mg total)  by mouth every 8 (eight) hours as needed. 20 tablet 0   pantoprazole (PROTONIX) 40 MG tablet Take 40 mg by mouth 2 (two) times daily.     SUMAtriptan (IMITREX) 100 MG tablet Take 1 tablet (100 mg total) by mouth at onset of migraine. May repeat in 2 hours if headache persists or recurs. Do not take more than 2 doses in 24 hours. 10 tablet 2   traZODone (DESYREL) 100 MG tablet TAKE TWO TABLETS BY MOUTH EVERY NIGHT AT BEDTIME 180 tablet 1   sertraline (ZOLOFT) 50 MG tablet TAKE 1 TABLET BY MOUTH DAILY (Patient taking differently: Take 50 mg by mouth at bedtime.) 90 tablet 1   ALPRAZolam (XANAX) 0.5 MG tablet Take 1 tablet (0.5 mg total) by mouth 2 (two) times daily as needed for anxiety (anxiety/insomnia). 30 tablet 0   methocarbamol (ROBAXIN-750) 750 MG tablet Take 1 tablet (750 mg total) by mouth every 8 (eight) hours as needed (muscle spasms, pain). 50 tablet 0   promethazine (PHENERGAN) 25 MG tablet Take 1 tablet (25 mg total) by mouth every 8 (eight) hours as needed for nausea or vomiting. 30 tablet 1   No facility-administered medications prior to visit.    Allergies  Allergen Reactions   Naltrexone Nausea Only   Rosuvastatin Nausea Only   Tape Itching and Other (See Comments)    Skin itches if tape is left on for awhile   Tramadol Nausea And Vomiting   Effexor [Venlafaxine] Nausea Only and Rash   Norco [Hydrocodone-Acetaminophen] Rash   Voltaren [Diclofenac] Rash    Review of Systems  Constitutional:  Negative for weight loss.  HENT:  Positive for congestion (just sinus congestion from allergies).   Respiratory:  Negative for cough.   Cardiovascular:  Positive for palpitations.  Gastrointestinal:  Negative for constipation and diarrhea.  Genitourinary:  Negative for dysuria and frequency.  Musculoskeletal:  Negative for joint pain and myalgias.  Neurological:  Negative for headaches.  Psychiatric/Behavioral:         See HPI        Objective:    Physical Exam   BP (!) 100/58 (BP Location: Right Arm, Patient Position: Sitting, Cuff Size: Normal)   Pulse 87   Temp 98.8 F (37.1 C) (Oral)   Resp 16   Ht 5\' 9"  (1.753 m)   Wt 185 lb (83.9 kg)   SpO2 99%   BMI 27.32 kg/m  Wt Readings from Last 3 Encounters:  02/15/23 185 lb (83.9 kg)  01/21/23 183 lb (83 kg)  12/23/22 182 lb (82.6 kg)   Physical Exam  Constitutional: She is oriented to person, place, and time. She appears well-developed and well-nourished. No distress.  HENT:  Head: Normocephalic and atraumatic.  Right Ear: Tympanic membrane and ear canal normal.  Left Ear: Tympanic membrane and ear canal normal.  Mouth/Throat: Oropharynx is clear and moist.  Eyes: Pupils are equal, round, and reactive to light. No scleral icterus.  Neck: Normal range of motion. No thyromegaly present.  Cardiovascular: Normal rate and regular rhythm.   No murmur heard. Pulmonary/Chest: Effort normal and breath sounds normal. No respiratory distress. He has no wheezes. She has no rales. She exhibits no tenderness.  Abdominal: Soft. Bowel sounds are normal. She exhibits no distension and no mass. There is no tenderness. There is no rebound and no guarding.  Musculoskeletal: She exhibits no edema.  Lymphadenopathy:    She has no cervical adenopathy.  Neurological: She is alert and oriented to person, place,  and time. She has 1+ right patellar reflex, 2+ left patella reflex. She exhibits normal muscle tone. Coordination normal.  Skin: Skin is warm and dry.  Psychiatric: She has a normal mood and affect. Her behavior is normal. Judgment and thought content normal.  Breast/pelvic: deferred           Assessment & Plan:       Assessment & Plan:   Problem List Items Addressed This Visit       Unprioritized   Thyroid nodule    Continue on Synthroid.  Plan follow up thyroid US 1/25.       Subclinical hypothyroidism    She was placed on cytomel by  [facility] who she is no longer seeing.  Her copay is now >$100.  Recommend d/c Cytomel. Follow up TFT's.      Pulmonary embolism (HCC)    Continue on Eliquis twice daily       Preventative health care - Primary    Continue healthy diet, exercise.  Immunizations reviewed and up to date. Mammo up to date.      Palpitations    EKG tracing is personally reviewed.  EKG notes NSR.  No acute changes.  Had normal heart monitor with cardiology last year. Monitor.       Relevant Orders   EKG 12-Lead (Completed)   OSTEOARTHRITIS    Stable at this time. Controled with tylenol       Hot flashes    Stable at this time       Depression with anxiety    Uncontrolled.  Will increase sertraline from 50mg  to 75mg .  Continue on Xanax as needed.       Relevant Medications   sertraline (ZOLOFT) 50 MG tablet   Degenerative disc disease, lumbar    Stable per patient.       Cyst of left ovary    Full hysterectomy       Anxiety   Relevant Medications   sertraline (ZOLOFT) 50 MG tablet   Other Relevant Orders   DRUG MONITORING, PANEL 8 WITH CONFIRMATION, URINE   Other Visit Diagnoses     Anemia, unspecified type       Relevant Orders   CBC w/Diff   B12 and Folate Panel   Iron, TIBC and Ferritin Panel   Hypothyroidism, unspecified type       Relevant Orders   TSH   T3, free   T4, free   Hyperglycemia       Relevant Orders   Comp Met (CMET)   HgB A1c   Skin cyst       Relevant Orders   Ambulatory referral to Dermatology       I have discontinued Roda Shutters. Papp's promethazine, methocarbamol, and ALPRAZolam. I have also changed her sertraline. Additionally, I am having her maintain her SUMAtriptan, Melatonin, liothyronine, pantoprazole, multivitamin with minerals, ondansetron, famotidine, traZODone, levothyroxine, gabapentin, apixaban, and atorvastatin.  Meds ordered this encounter  Medications   sertraline (ZOLOFT) 50 MG tablet    Sig: Take 1.5 tablets (75 mg total)  by mouth daily.    Dispense:  135 tablet    Refill:  1    Order Specific Question:   Supervising Provider    Answer:   Danise Edge A [[id number]]

## 2023-02-15 NOTE — Assessment & Plan Note (Signed)
Full hysterectomy

## 2023-02-15 NOTE — Assessment & Plan Note (Signed)
Stable at this time 

## 2023-02-15 NOTE — Assessment & Plan Note (Addendum)
Uncontrolled.  Will increase sertraline from 50mg  to 75mg .  Continue on Xanax as needed.

## 2023-02-15 NOTE — Assessment & Plan Note (Signed)
Continue healthy diet, exercise.  Immunizations reviewed and up to date. Mammo up to date.

## 2023-02-15 NOTE — Assessment & Plan Note (Addendum)
She was placed on cytomel by [facility] who she is no longer seeing.  Her copay is now >$100.  Recommend d/c Cytomel. Follow up TFT's.

## 2023-02-15 NOTE — Assessment & Plan Note (Signed)
Continue on Eliquis twice daily.

## 2023-02-15 NOTE — Assessment & Plan Note (Signed)
Stable at this time. Controled with tylenol

## 2023-02-15 NOTE — Assessment & Plan Note (Signed)
Stable per patient.

## 2023-02-15 NOTE — Assessment & Plan Note (Signed)
Has not had any episodes in months. Continue Imitrex as needed

## 2023-02-15 NOTE — Assessment & Plan Note (Addendum)
Continue on Synthroid.  Plan follow up thyroid US 1/25.

## 2023-02-15 NOTE — Progress Notes (Signed)
Subjective:     Patient ID: Kim Pham, female    DOB: 1980/09/04, 42 y.o.   MRN: [id number]  Chief Complaint  Patient presents with   Annual Exam         HPI  Discussed the use of AI scribe software for clinical note transcription with the patient, who gave verbal consent to proceed.   Over the past month BP has been running low.   Chest palpitations noted with her last night when she was trying to go to sleep  Eye exam up to date (June 2024)  Dentist exam is up to date (July 2024)  Mammogram is up to date (6 monts ago) Pap Smear is not needed due to hysterectomy    No new surgeries  Bump to head to getting bigger. She states she has had to for awhile.    Health Maintenance Due  Topic Date Due   COVID-19 Vaccine (5 - 2023-24 season) 12/19/2022    Past Medical History:  Diagnosis Date   Acute pulmonary embolism without acute cor pulmonale (HCC) 05/17/2022   ADHD (attention deficit hyperactivity disorder)    Adnexal mass 02/24/2021   in epic seen on [place]   Amenorrhea 08/14/2021   Anxiety 08/14/2021   Arthralgia 08/18/2016   Back pain 09/04/2021   Blood in stool 09/08/2021   Cardiac murmur 12/16/2021   Chest discomfort 04/20/2022   Cholecystitis 05/26/2022   Chondromalacia, patella 06/30/2021   Chronic calculous cholecystitis 06/08/2022   Chronic constipation 08/14/2021   Chronic headaches HISTORY OF MIGRAINES 08/14/2021   Chronic migraine without aura, with intractable migraine, so stated, with status migrainosus 07/27/2017   Complication of anesthesia 08/14/2021   PONV   Constipation 06/11/2021   Coronary artery calcification seen on CT scan 12/16/2021   Cyst of left ovary 06/17/2021   Degenerative disc disease, lumbar 09/04/2019   Depression with anxiety 03/26/2016   Diarrhea 07/08/2017   DOE (dyspnea on exertion) 04/20/2022   Effusion, left knee 12/16/2021   Endometriosis    Family history of BRCA gene mutation 06/17/2021   [facility] genetic  testing done 06/2021 - negative for breast cancer genes.   Family history of breast cancer 09/09/2015   Formatting of this note might be different from the original.  Mom dx'd age 46  Mat aunt dx'd age 27  Neither relative tested for BRCA  Formatting of this note might be different from the original.  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 (HCC)    High risk medication use 11/22/2016   history of Chiari malformation    History of Chiari malformation 11/22/2016   history of COVID 02/2021   LOW GRADE FEVER CONGESTION AND HEADACHE ALL SYMPTOMS RESOLVED   Hot flashes 06/11/2021   Hyperlipidemia    Hypertension    Insomnia 08/19/2020   Low blood pressure    pt states history of low blood pressure-fainted 12/2010-instructed to move slowly   Nipple discharge 05/19/2022   Nonepileptic episode (HCC) 12/22/2016   Numbness and tingling 02/25/2016   Obesity (BMI 30.0-34.9) 12/16/2021   On continuous oral anticoagulation 05/17/2022   OSTEOARTHRITIS 08/14/2021   Other epilepsy, intractable, without status epilepticus (HCC) 11/01/2016   Pain in right knee 11/13/2020   Pain in unspecified joint 08/18/2016   Palpitations 12/15/2021  Pelvic pain in female 08/18/2021   Photosensitivity 11/22/2016   PONV (postoperative nausea and vomiting)    Preventative health care 09/08/2021   Psychogenic nonepileptic seizure 07/27/2017   Pulmonary embolism (HCC)    Raynaud's disease without gangrene 11/22/2016   Rectal bleeding 06/11/2021   RUQ pain 05/07/2022   Sacroiliac joint dysfunction of right side 12/19/2019   Sicca syndrome (HCC) 11/22/2016   SLE (systemic lupus erythematosus) (HCC) 03/26/2016   Status post laparoscopic cholecystectomy 06/08/2022   Subclinical hypothyroidism    Thyroid nodule    Transient  alteration of awareness 01/01/2016   Vasomotor symptoms due to menopause 06/17/2021   Voice hoarseness 05/07/2022   Wears glasses for reading 08/14/2021    Past Surgical History:  Procedure Laterality Date   ABDOMINAL HYSTERECTOMY  2007   CHOLECYSTECTOMY N/A 06/08/2022   Procedure: LAPAROSCOPIC CHOLECYSTECTOMY WITH ICG;  Surgeon: Almond Lint, MD;  Location: [facility];  Service: General;  Laterality: N/A;   CYSTOSCOPY  2007   WITH HYSTERECTOMY   DIAGNOSTIC LAPAROSCOPY  2005   LUMBAR LAMINECTOMY/DECOMPRESSION MICRODISCECTOMY Left 11/08/2022   Procedure: L4-5 FAR LEFT LATERAL DISCECTOMY;  Surgeon: London Sheer, MD;  Location: [facility];  Service: Orthopedics;  Laterality: Left;   OOPHORECTOMY Left 08/18/2021   ROTATOR CUFF REPAIR Right 2010   SALPINGOOPHORECTOMY  04/16/2011   Procedure: SALPINGO OOPHERECTOMY;  Surgeon: Lenoard Aden, MD;  Location: [facility];  Service: Gynecology;  Laterality: Right;   skull decompression surgery 2005     @ [facility]    Family History  Problem Relation Age of Onset   Hypertension Mother    CAD Mother    Breast cancer Mother    Migraines Mother    Gout Mother    Heart disease Mother    Crohn's disease Mother    Hypertension Father    CAD Father    CVA Father    Heart disease Father    Colon polyps Father    Liver disease Father    Sjogren's syndrome Sister    Celiac disease Sister    Rheum arthritis Maternal Grandmother    Dementia Maternal Grandmother    Pancreatic cancer Maternal Grandfather    CAD Paternal Grandmother    Macular degeneration Paternal Grandmother    CVA Paternal Grandfather    Autism Son    ADD / ADHD Son    Breast cancer Maternal Aunt    Esophageal cancer Neg Hx    Colon cancer Neg Hx    Stomach cancer Neg Hx     Social History   Socioeconomic History   Marital status: Married    Spouse name: Not on file   Number of children: 1   Years of education: Assoc   Highest education level: Not on file   Occupational History   Occupation: nurse  Tobacco Use   Smoking status: Former    Current packs/day: 0.00    Average packs/day: 1 pack/day for 10.0 years (10.0 ttl pk-yrs)    Types: Cigarettes    Start date: 04/04/1994    Quit date: 04/04/2004    Years since quitting: 18.8    Passive exposure: Past   Smokeless tobacco: Never  Vaping Use   Vaping status: Never Used  Substance and Sexual Activity   Alcohol use: Not Currently   Drug use: Never   Sexual activity: Yes    Birth control/protection: Surgical  Other Topics Concern   Not on file  Social History Narrative   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   Married   1 son- born 2004 has autism   Enjoys exercise, special olympics with son   Drinks 4-5 cups of coffee a day       Right handed   Social Determinants of Health   Financial Resource Strain: Medium Risk (09/14/2022)   Received from [facility], [facility]   Overall Financial Resource Strain (CARDIA)    Difficulty of Paying Living Expenses: Somewhat hard  Food Insecurity: No Food Insecurity (12/28/2022)   Hunger Vital Sign    Worried About Running Out of Food in the Last Year: Never true    Ran Out of Food in the Last Year: Never true  Transportation Needs: No Transportation Needs (12/28/2022)   PRAPARE - Administrator, Civil Service (Medical): No    Lack of Transportation (Non-Medical): No  Physical Activity: Insufficiently Active (09/14/2022)   Received from [facility]   Exercise Vital Sign    Days of Exercise per Week: 3 days    Minutes of Exercise per Session: 30 min  Stress: Stress Concern Present (09/14/2022)   Received from [facility], [facility] of Occupational Health - Occupational Stress Questionnaire    Feeling of Stress : To some extent  Social Connections: Socially Integrated (09/14/2022)   Received from [facility], [facility]   Social Network    How would you rate your social network (family,  work, friends)?: Good participation with social networks  Intimate Partner Violence: Not At Risk (12/24/2022)   Humiliation, Afraid, Rape, and Kick questionnaire    Fear of Current or Ex-Partner: No    Emotionally Abused: No    Physically Abused: No    Sexually Abused: No    Outpatient Medications Prior to Visit  Medication Sig Dispense Refill   apixaban (ELIQUIS) 5 MG TABS tablet Take 1 tablet (5 mg total) by mouth 2 (two) times daily. 60 tablet 2   atorvastatin (LIPITOR) 20 MG tablet TAKE 1 TABLET BY MOUTH DAILY 90 tablet 1   famotidine (PEPCID) 20 MG tablet Take 20 mg by mouth at bedtime.     gabapentin (NEURONTIN) 300 MG capsule Take 1 capsule (300 mg total) by mouth 3 (three) times daily. 90 capsule 2   levothyroxine (SYNTHROID) 50 MCG tablet Take 1 tablet (50 mcg total) by mouth daily before breakfast. 90 tablet 0   liothyronine (CYTOMEL) 5 MCG tablet Take 10 mcg by mouth in the morning.     Melatonin 10 MG TABS Take 20 mg by mouth at bedtime.     Multiple Vitamin (MULTIVITAMIN WITH MINERALS) TABS tablet Take 1 tablet by mouth daily.     ondansetron (ZOFRAN-ODT) 4 MG disintegrating tablet Take 1 tablet (4 mg total) by mouth every 8 (eight) hours as needed. 20 tablet 0   pantoprazole (PROTONIX) 40 MG tablet Take 40 mg by mouth 2 (two) times daily.     sertraline (ZOLOFT) 50 MG tablet TAKE 1 TABLET BY MOUTH DAILY (Patient taking differently: Take 50 mg by mouth at bedtime.) 90 tablet 1   SUMAtriptan (IMITREX) 100 MG tablet Take 1 tablet (100 mg total) by mouth at onset of migraine. May repeat in 2 hours if headache persists or recurs. Do not take more than 2 doses in 24 hours. 10 tablet 2   traZODone (DESYREL) 100 MG tablet TAKE TWO TABLETS BY MOUTH EVERY NIGHT AT BEDTIME 180 tablet 1   ALPRAZolam (XANAX) 0.5 MG tablet Take 1 tablet (0.5 mg  total) by mouth 2 (two) times daily as needed for anxiety (anxiety/insomnia). 30 tablet 0   methocarbamol (ROBAXIN-750) 750 MG tablet Take 1 tablet  (750 mg total) by mouth every 8 (eight) hours as needed (muscle spasms, pain). 50 tablet 0   promethazine (PHENERGAN) 25 MG tablet Take 1 tablet (25 mg total) by mouth every 8 (eight) hours as needed for nausea or vomiting. 30 tablet 1   No facility-administered medications prior to visit.    Allergies  Allergen Reactions   Naltrexone Nausea Only   Rosuvastatin Nausea Only   Tape Itching and Other (See Comments)    Skin itches if tape is left on for awhile   Tramadol Nausea And Vomiting   Effexor [Venlafaxine] Nausea Only and Rash   Norco [Hydrocodone-Acetaminophen] Rash   Voltaren [Diclofenac] Rash    Review of Systems  Constitutional:  Negative for chills, fever and weight loss.  HENT:  Positive for congestion. Negative for ear discharge, ear pain and nosebleeds.   Eyes:  Negative for pain.  Respiratory:  Negative for cough, shortness of breath and wheezing.   Cardiovascular:  Positive for palpitations.  Gastrointestinal:  Negative for diarrhea, nausea and vomiting.  Genitourinary:  Negative for frequency and urgency.  Musculoskeletal:  Positive for back pain and neck pain.  Skin:        Bump to right side forehead   Neurological:  Negative for dizziness and headaches.  Psychiatric/Behavioral:  Negative for depression.        Objective:    Physical Exam Constitutional:      Appearance: Normal appearance. She is normal weight.  HENT:     Head: Normocephalic.     Right Ear: Tympanic membrane, ear canal and external ear normal.     Left Ear: Tympanic membrane, ear canal and external ear normal.     Nose: Nose normal.  Eyes:     Extraocular Movements: Extraocular movements intact.     Conjunctiva/sclera: Conjunctivae normal.     Pupils: Pupils are equal, round, and reactive to light.  Cardiovascular:     Rate and Rhythm: Normal rate.     Pulses: Normal pulses.  Pulmonary:     Effort: Pulmonary effort is normal.     Breath sounds: Normal breath sounds.  Abdominal:      General: Abdomen is flat. Bowel sounds are normal.     Palpations: Abdomen is soft.  Musculoskeletal:        General: Normal range of motion.     Cervical back: Normal range of motion.  Skin:    General: Skin is warm.     Capillary Refill: Capillary refill takes less than 2 seconds.  Neurological:     General: No focal deficit present.     Mental Status: She is alert and oriented to person, place, and time. Mental status is at baseline.  Psychiatric:        Mood and Affect: Mood normal.        Behavior: Behavior normal.        Thought Content: Thought content normal.        Judgment: Judgment normal.      BP (!) 85/56 (BP Location: Right Arm, Patient Position: Sitting, Cuff Size: Normal)   Pulse 87   Temp 98.8 F (37.1 C) (Oral)   Resp 16   Ht 5\' 9"  (1.753 m)   Wt 185 lb (83.9 kg)   SpO2 99%   BMI 27.32 kg/m  Wt Readings from Last 3 Encounters:  02/15/23 185 lb (83.9 kg)  01/21/23 183 lb (83 kg)  12/23/22 182 lb (82.6 kg)       Assessment & Plan:   Problem List Items Addressed This Visit   None   I have discontinued Roda Shutters. [place]'s promethazine, methocarbamol, and ALPRAZolam. I am also having her maintain her SUMAtriptan, Melatonin, liothyronine, pantoprazole, multivitamin with minerals, ondansetron, sertraline, famotidine, traZODone, levothyroxine, gabapentin, apixaban, and atorvastatin.  No orders of the defined types were placed in this encounter.

## 2023-02-15 NOTE — Patient Instructions (Signed)
VISIT SUMMARY:  Today, we discussed your recent episodes of palpitations, which you described as a 'deep beat' and sometimes accompanied by breathlessness and sweating. We also reviewed your anxiety management and hypothyroidism treatment. Given your symptoms and family history, we are taking a thorough approach to ensure your heart health and overall well-being.  YOUR PLAN:  -PALPITATIONS: Palpitations are feelings that your heart is beating too hard or too fast, skipping a beat, or fluttering. We will perform an EKG today to check your heart's electrical activity and thyroid studies to rule out any thyroid-related issues. Your EKG is normal. We will monitor your symptoms for now. If your symptoms worsen, we will refer you back to your cardiolgist, Dr. Josiah Lobo.  -ANXIETY: Anxiety is a feeling of worry, nervousness, or unease. We will increase your Sertraline dose to 75mg  daily to help manage your symptoms better. Additionally, we will refill your Xanax prescription for acute anxiety episodes, and you signed a controlled substance contract for Xanax today.  -HYPOTHYROIDISM: Hypothyroidism is a condition where your thyroid gland doesn't produce enough thyroid hormone. We will discontinue Cytomel due to its high cost and continue levothyroxine. Update thyroid labs today.   -GENERAL HEALTH MAINTENANCE: For your general health, you are due for a left breast diagnostic mammogram in January. We will also check your A1C levels due to slightly elevated blood sugar last month. Please follow up in 6 weeks for a review of your progress and any further necessary tests.  INSTRUCTIONS:. Follow up with [place] in 6 weeks to review your progress and any further necessary tests.

## 2023-02-16 ENCOUNTER — Telehealth: Payer: Self-pay | Admitting: Family

## 2023-02-16 ENCOUNTER — Other Ambulatory Visit: Payer: Self-pay | Admitting: Family

## 2023-02-16 ENCOUNTER — Encounter: Payer: Self-pay | Admitting: Family

## 2023-02-16 ENCOUNTER — Other Ambulatory Visit: Payer: Self-pay | Admitting: Orthopedic Surgery

## 2023-02-16 DIAGNOSIS — E538 Deficiency of other specified B group vitamins: Secondary | ICD-10-CM | POA: Insufficient documentation

## 2023-02-16 LAB — IRON,TIBC AND FERRITIN PANEL
%SAT: 22 % (ref 16–45)
Ferritin: 13 ng/mL — ABNORMAL LOW (ref 16–232)
Iron: 84 ug/dL (ref 40–190)
TIBC: 383 ug/dL (ref 250–450)

## 2023-02-16 MED ORDER — CYANOCOBALAMIN 1000 MCG/ML IJ SOLN
INTRAMUSCULAR | 1 refills | Status: DC
Start: 2023-02-16 — End: 2023-07-05

## 2023-02-16 MED ORDER — SYRINGE 21G X 1-1/2" 3 ML MISC: 0 refills | Status: AC

## 2023-02-16 NOTE — Telephone Encounter (Signed)
Pt is inquiring if we can add a lipid panel dx hyperlipidemia, to her labs from yesterday please.

## 2023-02-16 NOTE — Telephone Encounter (Signed)
B12 is low despite oral b12 tabs.  I would like her to stop the oral b12 and begin b12 IM weekly x 4 then monthly. I sent to her pharmacy.  I imagine she will want to give to herself at home but she is welcome to schedule a nurse visit if she wants.   Ferritin is a little low. Please continue multivitamin with minerals.   One of her liver function tests is mildly elevated. We will keep an eye on this.   Thyroid testing looks good.

## 2023-02-17 ENCOUNTER — Other Ambulatory Visit: Payer: Self-pay | Admitting: *Deleted

## 2023-02-17 ENCOUNTER — Ambulatory Visit: Payer: BC Managed Care – PPO

## 2023-02-17 DIAGNOSIS — E782 Mixed hyperlipidemia: Secondary | ICD-10-CM

## 2023-02-17 LAB — DRUG MONITORING, PANEL 8 WITH CONFIRMATION, URINE
6 Acetylmorphine: NEGATIVE ng/mL (ref <10)
Alcohol Metabolites: POSITIVE ng/mL — AB (ref <500)
Amphetamines: NEGATIVE ng/mL (ref <500)
Benzodiazepines: NEGATIVE ng/mL (ref <100)
Buprenorphine, Urine: NEGATIVE ng/mL (ref <5)
Cocaine Metabolite: NEGATIVE ng/mL (ref <150)
Creatinine: 104.1 mg/dL (ref >=20.0)
Ethyl Glucuronide (ETG): 5937 ng/mL — ABNORMAL HIGH (ref <500)
Ethyl Sulfate (ETS): 928 ng/mL — ABNORMAL HIGH (ref <100)
MDMA: NEGATIVE ng/mL (ref <500)
Marijuana Metabolite: NEGATIVE ng/mL (ref <20)
Opiates: NEGATIVE ng/mL (ref <100)
Oxidant: NEGATIVE ug/mL (ref <200)
Oxycodone: NEGATIVE ng/mL (ref <100)
pH: 6.1 (ref 4.5–9.0)

## 2023-02-17 LAB — LIPID PANEL
Cholesterol: 258 mg/dL — ABNORMAL HIGH (ref <200)
HDL: 88 mg/dL (ref >=50)
LDL Cholesterol (Calc): 151 mg/dL — ABNORMAL HIGH
Non-HDL Cholesterol (Calc): 170 mg/dL — ABNORMAL HIGH (ref <130)
Total CHOL/HDL Ratio: 2.9 (calc) (ref <5.0)
Triglycerides: 86 mg/dL (ref <150)

## 2023-02-17 LAB — DM TEMPLATE

## 2023-02-17 NOTE — Progress Notes (Signed)
lipi

## 2023-02-17 NOTE — Telephone Encounter (Signed)
Too late to add in house, faxed request to send to [facility] for lipid panel.

## 2023-02-18 NOTE — Telephone Encounter (Signed)
Patient notified of results, new mediction and provider's comments.  She will prefer to give B12 shoots at home.

## 2023-02-23 ENCOUNTER — Ambulatory Visit: Payer: BC Managed Care – PPO | Admitting: Behavioral Health

## 2023-02-23 ENCOUNTER — Encounter: Payer: Self-pay | Admitting: Behavioral Health

## 2023-02-23 DIAGNOSIS — F411 Generalized anxiety disorder: Secondary | ICD-10-CM | POA: Diagnosis not present

## 2023-02-23 DIAGNOSIS — F4321 Adjustment disorder with depressed mood: Secondary | ICD-10-CM

## 2023-02-23 NOTE — Progress Notes (Signed)
[facility] Counselor/Therapist Progress Note  Patient ID: Kim Pham, MRN: [id number],    Date: 02/23/2023  Time Spent: 50 minutes, 1:00 PM until 1:50 PM this session was held via video teletherapy. The patient consented to the video teletherapy and was located in her home during this session. She is aware it is the responsibility of the patient to secure confidentiality on her end of the session. The provider was in a private home office for the duration of this session.      Treatment Type: Individual Therapy  Reported Symptoms: Anxiety/stress  Mental Status Exam: Appearance:  Well Groomed     Behavior: Appropriate  Motor: Normal  Speech/Language:  Clear and Coherent  Affect: Appropriate  Mood: normal  Thought process: normal  Thought content:   WNL  Sensory/Perceptual disturbances:   WNL  Orientation: oriented to person, place, time/date, situation, day of week, and month of year  Attention: Good  Concentration: Good  Memory: WNL  Fund of knowledge:  Good  Insight:   Good  Judgment:  Good  Impulse Control: Good   Risk Assessment: Danger to Self:  No Self-injurious Behavior: No Danger to Others: No Duty to Warn:no Physical Aggression / Violence:No  Access to Firearms a concern: No  Gang Involvement:No   Subjective: The patient continues to grieve and says there are good days and bad days.  She does feel that she is grieving appropriately but at times feels that she cannot grieve in front of other people other than her husband or her best friend.  She is journaling.  She did increase her depression medication which is helping with mood stability and also improving sleep.  Work is a good Therapist, art.  There is still some things going on in terms of handling of mom's estate that feel a little sneaky to her with her aunt but they are trying to figure out what is going on with that.  They are planning on having a ceremony in a few weeks with a spread her  ashes.  It is still a little we are going into her mom's house and they have gotten some things that they wanted that were her mom's.  The patient said that her husband is being very supportive but is also encouraging her to allow herself to grieve and not hold it in which endorsed.    There are some things happening on campus with her son which she has had to directly deal with and that is hard because she wants to help but also knows there is a limited amount that she can do.  She also knows that is part of a learning curve for him.  She and her siblings are also learning again how to interact with her father.  She reports that he has had a long history of alcohol abuse and was in treatment over the summer.  She is starting to think that she might be seeing some cognitive changes but her father lives with her grandmother and grandmother is not giving him much information.  She says that she wants to make sure she is doing well and is not sure she can handle much interaction with her father currently.  The last time she spoke with him she said he seemed to be a little distant and not sure exactly what that is about.  She is reaching out if she has the ability to do so but says he does not typically respond to phone calls or text messages.  Encouraged her to keep journaling, relying on her faith, getting outside to walk, spending time with the people that she cares about and to allow herself to grieve. Her back is doing better.  She recently had staples removed and has a follow-up next month.  The last few days there have been some discomfort for the most part she is substantially better than she was prior to the first surgery. She does contract for safety having no thoughts of hurting herself or anyone else.  Interventions: Cognitive Behavioral Therapy  Diagnosis: Generalized anxiety disorder  Plan: I will meet with the patient every 2 weeks virtually Target date: January 17, 2023 Progress: 30%   The patient has had 2 surgeries as well as her mom's death so had not seen her in several months.  I did review the target goals and a new target date for continuing above-stated goals is July 18, 2023. [name], [facility]                 [name], [facility]               [name], [facility]               [name], [facility]               [name], [facility]

## 2023-02-24 ENCOUNTER — Encounter: Payer: Self-pay | Admitting: Family

## 2023-02-25 ENCOUNTER — Ambulatory Visit (INDEPENDENT_AMBULATORY_CARE_PROVIDER_SITE_OTHER): Payer: BC Managed Care – PPO | Admitting: Family Medicine

## 2023-02-25 ENCOUNTER — Encounter: Payer: Self-pay | Admitting: Family Medicine

## 2023-02-25 VITALS — BP 100/70 | HR 73 | Temp 98.4°F | Resp 18 | Ht 69.0 in | Wt 184.8 lb

## 2023-02-25 DIAGNOSIS — G43811 Other migraine, intractable, with status migrainosus: Secondary | ICD-10-CM | POA: Diagnosis not present

## 2023-02-25 DIAGNOSIS — M542 Cervicalgia: Secondary | ICD-10-CM | POA: Diagnosis not present

## 2023-02-25 MED ORDER — CYCLOBENZAPRINE HCL 10 MG PO TABS: 10.0000 mg | ORAL_TABLET | Freq: Three times a day (TID) | ORAL | 0 refills | Status: AC | PRN

## 2023-02-25 MED ORDER — HYDROCODONE-ACETAMINOPHEN 5-325 MG PO TABS: 1.0000 | ORAL_TABLET | Freq: Four times a day (QID) | ORAL | 0 refills | Status: DC | PRN

## 2023-02-25 NOTE — Progress Notes (Signed)
--  Established Patient Office Visit  Subjective   Patient ID: Kim Pham, female    DOB: 17-Dec-1980  Age: 42 y.o. MRN: [id number]  Chief Complaint  Patient presents with   Migraine    X5 days, no falls or injuries, hx of headaches, vision changes    HPI Discussed the use of AI scribe software for clinical note transcription with the patient, who gave verbal consent to proceed.  History of Present Illness   The patient, with a history of Chiari malformation (surgically corrected in 2005), migraines, and recent back surgery, presents with a five-day history of a severe headache. This headache is described as different from her typical migraines, with pain originating from the back of the head and radiating to the right side. Accompanying symptoms include altered vision, necessitating frequent adjustment of glasses, dizziness, and vomiting.  The patient has been managing the pain with Imitrex, Tylenol, Voltaren gel, and Robaxin, with no significant relief. She also reports a persistent numbness and tingling sensation in the left arm, which has been present since her recent back surgery.  The patient also has a history of pulmonary embolism and is on lifelong anticoagulation with Eliquis. She is unable to take NSAIDs due to the risk of bleeding.  The patient's headache has been so severe that it has affected her vision, making it blurry, and has caused her to drop things. The headache worsens when she bends her head forward. She has not experienced any fever.  The patient has a history of back surgeries, including a discectomy, laminectomy, and fusion surgery. She also has a known disc protrusion at C5, C6 with mild spinal stenosis, and at C6, C7.  The patient's headache is reminiscent of the headaches she experienced prior to her Chiari malformation surgery in 2005, raising concerns about a possible recurrence or complication.      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 (HCC) 05/27/2022   On continuous oral anticoagulation 05/17/2022   Pulmonary embolism (HCC) 05/11/2022   RUQ pain 05/07/2022   Voice hoarseness 05/07/2022   Thyroid nodule 05/04/2022   Former smoker 04/20/2022   Subclinical hypothyroidism 12/18/2021   Effusion, left knee 12/16/2021   Endometriosis 12/16/2021   Cardiac murmur 12/16/2021   Obesity (BMI 30.0-34.9) 12/16/2021   Coronary artery calcification seen on CT scan 12/16/2021   Palpitations 12/15/2021   Preventative health care 09/08/2021   Hyperlipidemia 09/08/2021   Amenorrhea 08/14/2021   Anxiety 08/14/2021   Chronic constipation 08/14/2021   Chronic headaches HISTORY OF MIGRAINES 08/14/2021   Complication of anesthesia 08/14/2021   HISTORY OF MIGRAINES 08/14/2021   OSTEOARTHRITIS 08/14/2021   Wears glasses for reading 08/14/2021   Chondromalacia, patella 06/30/2021   Family history of BRCA gene mutation 06/17/2021   Cyst of left ovary 06/17/2021   Vasomotor symptoms due to menopause 06/17/2021   Rectal bleeding 06/11/2021   Hot flashes 06/11/2021   Insomnia 08/19/2020   Gastroesophageal reflux disease 08/19/2020   Sacroiliac joint dysfunction of right side 12/19/2019   Degenerative disc disease, lumbar 09/04/2019   Diarrhea 07/08/2017   Psychogenic nonepileptic seizure  12/22/2016   Sicca syndrome (HCC) 11/22/2016   Raynaud's disease without gangrene 11/22/2016   History of Chiari malformation 11/22/2016   High risk medication use 11/22/2016   Other epilepsy, intractable, without status epilepticus (HCC)  11/01/2016   Arthralgia 08/18/2016   SLE (systemic lupus erythematosus) (HCC) 03/26/2016   Depression with anxiety 03/26/2016   Transient alteration of awareness 01/01/2016   Chiari malformation 12/19/2015   Family history of breast cancer 09/09/2015   Past Medical History:  Diagnosis Date   Acute pulmonary embolism without acute cor pulmonale (HCC) 05/17/2022   ADHD (attention deficit hyperactivity disorder)    Adnexal mass 02/24/2021   in epic seen on [place]   Amenorrhea 08/14/2021   Anxiety 08/14/2021   Arthralgia 08/18/2016   Back pain 09/04/2021   Blood in stool 09/08/2021   Cardiac murmur 12/16/2021   Chest discomfort 04/20/2022   Cholecystitis 05/26/2022   Chondromalacia, patella 06/30/2021   Chronic calculous cholecystitis 06/08/2022   Chronic constipation 08/14/2021   Chronic headaches HISTORY OF MIGRAINES 08/14/2021   Chronic migraine without aura, with intractable migraine, so stated, with status migrainosus 07/27/2017   Complication of anesthesia 08/14/2021   PONV   Constipation 06/11/2021   Coronary artery calcification seen on CT scan 12/16/2021   Cyst of left ovary 06/17/2021   Degenerative disc disease, lumbar 09/04/2019   Depression with anxiety 03/26/2016   Diarrhea 07/08/2017   DOE (dyspnea on exertion) 04/20/2022   Effusion, left knee 12/16/2021   Endometriosis    Family history of BRCA gene mutation 06/17/2021   [facility] genetic testing done 06/2021 - negative for breast cancer genes.   Family history of breast cancer 09/09/2015   Formatting of this note might be different from the original.  Mom dx'd age 57  Mat aunt dx'd age 47  Neither relative tested for BRCA  Formatting of this note might be different from the original.  Mom dx'd age 9  Mat aunt dx'd age 63  Neither relative tested for BRCA   Fatigue 03/26/2016   Former smoker 04/20/2022   Gastroesophageal reflux disease 08/19/2020   Hair loss 11/22/2016   Heterozygous for 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  mutation (HCC)    High risk medication use 11/22/2016   history of Chiari malformation    History of Chiari malformation 11/22/2016   history of COVID 02/2021   LOW GRADE FEVER CONGESTION AND HEADACHE ALL SYMPTOMS RESOLVED   Hot flashes 06/11/2021   Hyperlipidemia    Hypertension    Insomnia 08/19/2020   Low blood pressure    pt states history of low blood pressure-fainted 12/2010-instructed to move slowly   Nipple discharge 05/19/2022   Nonepileptic episode (HCC) 12/22/2016   Numbness and tingling 02/25/2016   Obesity (BMI 30.0-34.9) 12/16/2021   On continuous oral anticoagulation 05/17/2022   OSTEOARTHRITIS 08/14/2021   Other epilepsy, intractable, without status epilepticus (HCC) 11/01/2016   Pain in right knee 11/13/2020   Pain in unspecified joint 08/18/2016   Palpitations 12/15/2021   Pelvic pain in female 08/18/2021   Photosensitivity 11/22/2016   PONV (postoperative nausea and vomiting)    Preventative health care 09/08/2021   Psychogenic nonepileptic seizure 07/27/2017   Pulmonary embolism (HCC)    Raynaud's disease without gangrene 11/22/2016   Rectal bleeding 06/11/2021   RUQ pain 05/07/2022   Sacroiliac joint dysfunction of right side 12/19/2019   Sicca syndrome (HCC) 11/22/2016   SLE (systemic lupus erythematosus) (HCC) 03/26/2016   Status post laparoscopic cholecystectomy 06/08/2022   Subclinical hypothyroidism  Thyroid nodule    Transient alteration of awareness 01/01/2016   Vasomotor symptoms due to menopause 06/17/2021   Voice hoarseness 05/07/2022   Wears glasses for reading 08/14/2021   Past Surgical History:  Procedure Laterality Date   ABDOMINAL HYSTERECTOMY  2007   CHOLECYSTECTOMY N/A 06/08/2022   Procedure: LAPAROSCOPIC CHOLECYSTECTOMY WITH ICG;  Surgeon: Almond Lint, MD;  Location: [facility];  Service: General;  Laterality: N/A;   CYSTOSCOPY  2007   WITH HYSTERECTOMY   DIAGNOSTIC LAPAROSCOPY  2005   LUMBAR LAMINECTOMY/DECOMPRESSION  MICRODISCECTOMY Left 11/08/2022   Procedure: L4-5 FAR LEFT LATERAL DISCECTOMY;  Surgeon: London Sheer, MD;  Location: [facility];  Service: Orthopedics;  Laterality: Left;   OOPHORECTOMY Left 08/18/2021   ROTATOR CUFF REPAIR Right 2010   SALPINGOOPHORECTOMY  04/16/2011   Procedure: SALPINGO OOPHERECTOMY;  Surgeon: Lenoard Aden, MD;  Location: [facility];  Service: Gynecology;  Laterality: Right;   skull decompression surgery 2005     @ [facility]   Social History   Tobacco Use   Smoking status: Former    Current packs/day: 0.00    Average packs/day: 1 pack/day for 10.0 years (10.0 ttl pk-yrs)    Types: Cigarettes    Start date: 04/04/1994    Quit date: 04/04/2004    Years since quitting: 18.9    Passive exposure: Past   Smokeless tobacco: Never  Vaping Use   Vaping status: Never Used  Substance Use Topics   Alcohol use: Not Currently   Drug use: Never   Social History   Socioeconomic History   Marital status: Married    Spouse name: Not on file   Number of children: 1   Years of education: Assoc   Highest education level: Not on file  Occupational History   Occupation: nurse  Tobacco Use   Smoking status: Former    Current packs/day: 0.00    Average packs/day: 1 pack/day for 10.0 years (10.0 ttl pk-yrs)    Types: Cigarettes    Start date: 04/04/1994    Quit date: 04/04/2004    Years since quitting: 18.9    Passive exposure: Past   Smokeless tobacco: Never  Vaping Use   Vaping status: Never Used  Substance and Sexual Activity   Alcohol use: Not Currently   Drug use: Never   Sexual activity: Yes    Birth control/protection: Surgical  Other Topics Concern   Not on file  Social History Narrative   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   Married   1 son- born 2004 has autism   Enjoys exercise, special olympics with son   Drinks 4-5 cups of coffee a day       Right handed   Social Determinants of Health   Financial Resource Strain: Medium Risk (09/14/2022)   Received  from [facility], [facility]   Overall Financial Resource Strain (CARDIA)    Difficulty of Paying Living Expenses: Somewhat hard  Food Insecurity: No Food Insecurity (12/28/2022)   Hunger Vital Sign    Worried About Running Out of Food in the Last Year: Never true    Ran Out of Food in the Last Year: Never true  Transportation Needs: No Transportation Needs (12/28/2022)   PRAPARE - Administrator, Civil Service (Medical): No    Lack of Transportation (Non-Medical): No  Physical Activity: Insufficiently Active (09/14/2022)   Received from [facility], [facility]   Exercise Vital Sign    Days of Exercise per Week: 3  days    Minutes of Exercise per Session: 30 min  Stress: Stress Concern Present (09/14/2022)   Received from [facility], [facility] Occup[facility] - Occupational Stress Questionnaire    Feeling of Stress : To some extent  Social Connections: Socially Integrated (09/14/2022)   Received from [facility], [facility]   Social Network    How would you rate your social network (family, work, friends)?: Good participation with social networks  Intimate Partner Violence: Not At Risk (12/24/2022)   Humiliation, Afraid, Rape, and Kick questionnaire    Fear of Current or Ex-Partner: No    Emotionally Abused: No    Physically Abused: No    Sexually Abused: No   Family Status  Relation Name Status   Mother  Deceased   Father  Alive   Sister  Alive   Brother  Alive   MGM  Alive   MGF  Deceased   PGM  Alive   PGF  Deceased   Son  Alive       autoimmune disease    Mat Aunt  (Not Specified)   Neg Hx  (Not Specified)  No partnership data on file   Family History  Problem Relation Age of Onset   Hypertension Mother    CAD Mother    Breast cancer Mother    Migraines Mother    Gout Mother    Heart disease Mother    Crohn's disease Mother    Hypertension Father    CAD Father    CVA Father    Heart disease Father     Colon polyps Father    Liver disease Father    Sjogren's syndrome Sister    Celiac disease Sister    Rheum arthritis Maternal Grandmother    Dementia Maternal Grandmother    Pancreatic cancer Maternal Grandfather    CAD Paternal Grandmother    Macular degeneration Paternal Grandmother    CVA Paternal Grandfather    Autism Son    ADD / ADHD Son    Breast cancer Maternal Aunt    Esophageal cancer Neg Hx    Colon cancer Neg Hx    Stomach cancer Neg Hx    Allergies  Allergen Reactions   Naltrexone Nausea Only   Rosuvastatin Nausea Only   Tape Itching and Other (See Comments)    Skin itches if tape is left on for awhile   Tramadol Nausea And Vomiting   Effexor [Venlafaxine] Nausea Only and Rash   Norco [Hydrocodone-Acetaminophen] Rash   Voltaren [Diclofenac] Rash      Review of Systems  Constitutional:  Negative for chills, fever and malaise/fatigue.  HENT:  Negative for congestion and hearing loss.   Eyes:  Positive for blurred vision. Negative for discharge.  Respiratory:  Negative for cough, sputum production and shortness of breath.   Cardiovascular:  Negative for chest pain, palpitations and leg swelling.  Gastrointestinal:  Negative for abdominal pain, blood in stool, constipation, diarrhea, heartburn, nausea and vomiting.  Genitourinary:  Negative for dysuria, frequency, hematuria and urgency.  Musculoskeletal:  Positive for myalgias and neck pain. Negative for back pain and falls.  Skin:  Negative for rash.  Neurological:  Positive for tingling and headaches. Negative for dizziness, sensory change, loss of consciousness and weakness.  Endo/Heme/Allergies:  Negative for environmental allergies. Does not bruise/bleed easily.  Psychiatric/Behavioral:  Negative for depression and suicidal ideas. The patient is not nervous/anxious and does not have insomnia.  Objective:     BP 100/70 (BP Location: Left Arm, Patient Position: Sitting, Cuff Size: Normal)   Pulse 73    Temp 98.4 F (36.9 C) (Oral)   Resp 18   Ht 5\' 9"  (1.753 m)   Wt 184 lb 12.8 oz (83.8 kg)   SpO2 93%   BMI 27.29 kg/m  BP Readings from Last 3 Encounters:  02/25/23 100/70  02/15/23 (!) 100/58  01/21/23 128/77   Wt Readings from Last 3 Encounters:  02/25/23 184 lb 12.8 oz (83.8 kg)  02/15/23 185 lb (83.9 kg)  01/21/23 183 lb (83 kg)   SpO2 Readings from Last 3 Encounters:  02/25/23 93%  02/15/23 99%  01/21/23 99%      Physical Exam Vitals and nursing note reviewed.  Constitutional:      General: She is not in acute distress.    Appearance: Normal appearance. She is well-developed.  HENT:     Head: Normocephalic and atraumatic.     Right Ear: Tympanic membrane, ear canal and external ear normal. There is no impacted cerumen.     Left Ear: Tympanic membrane, ear canal and external ear normal. There is no impacted cerumen.     Nose: Nose normal.     Mouth/Throat:     Mouth: Mucous membranes are moist.     Pharynx: Oropharynx is clear. No oropharyngeal exudate or posterior oropharyngeal erythema.  Eyes:     General: Gaze aligned appropriately. No scleral icterus.       Right eye: No discharge.        Left eye: No discharge.     Extraocular Movements: Extraocular movements intact.     Right eye: Normal extraocular motion.     Left eye: Normal extraocular motion.     Conjunctiva/sclera: Conjunctivae normal.     Pupils: Pupils are equal, round, and reactive to light.  Neck:     Thyroid: No thyromegaly or thyroid tenderness.     Vascular: No JVD.  Cardiovascular:     Rate and Rhythm: Normal rate and regular rhythm.     Heart sounds: Normal heart sounds. No murmur heard. Pulmonary:     Effort: Pulmonary effort is normal. No respiratory distress.     Breath sounds: Normal breath sounds.  Abdominal:     General: Bowel sounds are normal. There is no distension.     Palpations: Abdomen is soft. There is no mass.     Tenderness: There is no abdominal tenderness. There  is no guarding or rebound.  Genitourinary:    Vagina: Normal.  Musculoskeletal:        General: Normal range of motion.     Right shoulder: Tenderness present.     Left shoulder: Tenderness present.     Cervical back: Normal range of motion and neck supple. Spasms and tenderness present. No rigidity. Pain with movement present.     Right lower leg: No edema.     Left lower leg: No edema.  Lymphadenopathy:     Cervical: No cervical adenopathy.  Skin:    General: Skin is warm and dry.     Findings: No erythema or rash.  Neurological:     Mental Status: She is alert and oriented to person, place, and time.     Cranial Nerves: No cranial nerve deficit.     Deep Tendon Reflexes: Reflexes are normal and symmetric.  Psychiatric:        Mood and Affect: Mood normal.  Behavior: Behavior normal.        Thought Content: Thought content normal.        Judgment: Judgment normal.      No results found for any visits on 02/25/23.  Last CBC Lab Results  Component Value Date   WBC 5.0 02/15/2023   HGB 13.2 02/15/2023   HCT 41.2 02/15/2023   MCV 88.8 02/15/2023   MCH 30.1 12/24/2022   RDW 13.5 02/15/2023   PLT 323.0 02/15/2023   Last metabolic panel Lab Results  Component Value Date   GLUCOSE 87 02/15/2023   NA 140 02/15/2023   K 4.5 02/15/2023   CL 103 02/15/2023   CO2 30 02/15/2023   BUN 12 02/15/2023   CREATININE 0.76 02/15/2023   GFR 96.37 02/15/2023   CALCIUM 9.7 02/15/2023   PHOS 4.7 (H) 05/12/2022   PROT 6.6 02/15/2023   ALBUMIN 4.3 02/15/2023   LABGLOB 2.9 01/01/2016   AGRATIO 1.6 01/01/2016   BILITOT 0.3 02/15/2023   ALKPHOS 114 02/15/2023   AST 32 02/15/2023   ALT 38 (H) 02/15/2023   ANIONGAP 9 12/24/2022   Last lipids Lab Results  Component Value Date   CHOL 258 (H) 02/17/2023   HDL 88 02/17/2023   LDLCALC 151 (H) 02/17/2023   TRIG 86 02/17/2023   CHOLHDL 2.9 02/17/2023   Last hemoglobin A1c Lab Results  Component Value Date   HGBA1C 5.5  02/15/2023   Last thyroid functions Lab Results  Component Value Date   TSH 0.81 02/15/2023   Last vitamin D Lab Results  Component Value Date   VD25OH 41 09/29/2021   Last vitamin B12 and Folate Lab Results  Component Value Date   VITAMINB12 266 02/15/2023   FOLATE 8.1 02/15/2023      The 10-year ASCVD risk score (Arnett DK, et al., 2019) is: 0.3%    Assessment & Plan:   Problem List Items Addressed This Visit       Unprioritized   Migraines - Primary   Relevant Medications   cyclobenzaprine (FLEXERIL) 10 MG tablet   HYDROcodone-acetaminophen (NORCO/VICODIN) 5-325 MG tablet   Other Relevant Orders   MR Brain W Wo Contrast   Other Visit Diagnoses     Cervicalgia       Relevant Medications   cyclobenzaprine (FLEXERIL) 10 MG tablet   HYDROcodone-acetaminophen (NORCO/VICODIN) 5-325 MG tablet   Other Relevant Orders   MR Cervical Spine Wo Contrast     Assessment and Plan    Migraine with atypical features   She has been experiencing a migraine for five days, presenting with atypical features such as altered vision, dizziness, and vomiting. Her history of migraines, typically responsive to Imitrex, does not align with the current episode's severity, prompting concerns for possible underlying conditions like Chiari malformation recurrence or cervical spine pathology. An MRI of the brain and neck at [facility] will help rule out these conditions. We will prescribe a muscle relaxer and low dose Vicodin for pain management, and recommend massage or self-massage with a ball in a pillowcase. She is advised to seek emergency care if the headache persists or worsens.  Chiari malformation   She underwent surgery for Chiari malformation in 2005. The current headache symptoms are similar to those experienced pre-surgery, necessitating an MRI of the brain and neck at [facility] to evaluate for potential recurrence.  Cervical disc protrusion with mild spinal  stenosis   She has cervical disc protrusion at C5-C6 and C6-C7 with mild spinal stenosis, presenting with  persistent numbness and tingling in the left arm. This raises concerns for possible progression, warranting an MRI of the brain and neck at [facility] for further evaluation.  Factor II mutation with history of pulmonary embolism   Following a pulmonary embolism in March 2023, she is currently on Eliquis. NSAIDs are avoided due to anticoagulation therapy. We discussed the risks of bleeding with NSAIDs and alternative pain management options, prescribing low dose Vicodin for pain.  General Health Maintenance   General health maintenance was discussed, emphasizing the importance of avoiding NSAIDs due to anticoagulation therapy and monitoring for signs of increased bleeding due to Eliquis.  Follow-up   We will follow up with MRI results and advise an emergency room visit if symptoms persist or worsen.        No follow-ups on file.    Donato Schultz, DO

## 2023-02-25 NOTE — Patient Instructions (Signed)

## 2023-02-25 NOTE — Telephone Encounter (Signed)
Patient scheduled to see Dr. Laury Axon -Almeta Monas today

## 2023-02-28 ENCOUNTER — Encounter: Payer: Self-pay | Admitting: Family Medicine

## 2023-02-28 ENCOUNTER — Telehealth: Payer: Self-pay | Admitting: Family

## 2023-02-28 NOTE — Telephone Encounter (Signed)
Pt called and stated that [place] [facility] is booked until next year. She requested for pcp to send order to [facility] or [facility] to be seen sooner. Please call and advise.

## 2023-02-28 NOTE — Telephone Encounter (Signed)
Patient received call from radiology and was scheduled for tomorrow

## 2023-03-01 ENCOUNTER — Inpatient Hospital Stay
Admission: RE | Admit: 2023-03-01 | Discharge: 2023-03-01 | Discharge status: Home / Self Care | Payer: BC Managed Care – PPO | Source: Ambulatory Visit | Attending: Family Medicine | Admitting: Family Medicine

## 2023-03-01 ENCOUNTER — Ambulatory Visit
Admission: RE | Admit: 2023-03-01 | Discharge: 2023-03-01 | Disposition: A | Discharge status: Home / Self Care | Payer: BC Managed Care – PPO | Source: Ambulatory Visit | Attending: Family Medicine | Admitting: Family Medicine

## 2023-03-01 ENCOUNTER — Encounter: Payer: Self-pay | Admitting: Orthopedic Surgery

## 2023-03-01 DIAGNOSIS — G43811 Other migraine, intractable, with status migrainosus: Secondary | ICD-10-CM

## 2023-03-01 DIAGNOSIS — M5412 Radiculopathy, cervical region: Secondary | ICD-10-CM

## 2023-03-01 DIAGNOSIS — R519 Headache, unspecified: Secondary | ICD-10-CM | POA: Diagnosis not present

## 2023-03-01 DIAGNOSIS — M542 Cervicalgia: Secondary | ICD-10-CM | POA: Diagnosis not present

## 2023-03-01 MED ORDER — GADOPICLENOL 0.5 MMOL/ML IV SOLN
9.0000 mL | Freq: Once | INTRAVENOUS | Status: AC | PRN
  Administered 2023-03-01: 9 mL via INTRAVENOUS

## 2023-03-02 ENCOUNTER — Encounter: Payer: Self-pay | Admitting: Family

## 2023-03-03 NOTE — Telephone Encounter (Signed)
Multiple messages sent to multiple providers by Pt. See mychart message to Dr. Laury Axon- closing this message

## 2023-03-05 ENCOUNTER — Other Ambulatory Visit: Payer: Self-pay | Admitting: Family

## 2023-03-05 DIAGNOSIS — E039 Hypothyroidism, unspecified: Secondary | ICD-10-CM

## 2023-03-06 ENCOUNTER — Other Ambulatory Visit: Payer: Self-pay | Admitting: Family

## 2023-03-06 DIAGNOSIS — E039 Hypothyroidism, unspecified: Secondary | ICD-10-CM

## 2023-03-08 ENCOUNTER — Telehealth: Payer: Self-pay | Admitting: *Deleted

## 2023-03-08 ENCOUNTER — Other Ambulatory Visit (INDEPENDENT_AMBULATORY_CARE_PROVIDER_SITE_OTHER): Payer: Self-pay

## 2023-03-08 ENCOUNTER — Ambulatory Visit (INDEPENDENT_AMBULATORY_CARE_PROVIDER_SITE_OTHER): Payer: BC Managed Care – PPO | Admitting: Orthopedic Surgery

## 2023-03-08 DIAGNOSIS — M5412 Radiculopathy, cervical region: Secondary | ICD-10-CM

## 2023-03-08 DIAGNOSIS — M542 Cervicalgia: Secondary | ICD-10-CM | POA: Diagnosis not present

## 2023-03-08 MED ORDER — METHYLPREDNISOLONE 4 MG PO TBPK: ORAL_TABLET | ORAL | 0 refills | Status: DC

## 2023-03-08 NOTE — Progress Notes (Signed)
Orthopedic Spine Surgery Office Note  Assessment: Patient is a 42 y.o. female with neck pain that radiates into her left arm.  She feels it goes into the lateral aspect of the arm into the radial forearm and into the radial 2 digits.  She has a disc herniation at C5/6.  Her symptoms are consistent with cervical radiculopathy   Plan: -Explained that initially conservative treatment is tried as a significant number of patients may experience relief with these treatment modalities. Discussed that the conservative treatments include:  -activity modification  -physical therapy  -over the counter pain medications  -medrol dosepak  -cervical steroid injections -Patient has tried gabapentin, Tylenol -Recommended she continue with gabapentin and Tylenol.  Prescribed a Medrol Dosepak.  I had previously referred her for a cervical injection and I recommended she continue with that -If she fails to improve with these conservative treatments, discussed C5/6 ACDF as a treatment option for her -Patient should return to office in 3 weeks, x-rays at next visit: AP/lateral/flex/ex lumbar   Patient expressed understanding of the plan and all questions were answered to the patient's satisfaction.   __________________________________________________________________________  History: Patient is a 42 y.o. female who has been previously seen in the office for lumbar radiculopathy and underwent L4/5 TLIF and PSIF.  She comes in today because she has had neck pain that radiates into her left upper extremity that started about 3 weeks ago.  She had seen her primary care doctor who started her on treatment about 3 weeks ago and got an MRI to evaluate for radiculopathy.  She feels the pain starts in her neck and radiates into the left upper extremity.  She feels it along the lateral aspect of the arm into the radial forearm and into the thumb and index finger.  She does not have any pain radiating into the right upper  extremity.  She said since this started she has noticed worse headaches.  She has also had nausea that correlates with when the pain is most significant.  She has decreased sensation in the thumb and index finger.  She has had trouble with fine motor skills on that hand as a result of this decrease sensation.  No other numbness or paresthesias.  There was no trauma or injury that preceded the onset of pain.  She feels good in her lumbar spine still and has not had any recurrence of her radiating leg pain.    Treatments tried: Tylenol, gabapentin  Physical Exam:  General: no acute distress, appears stated age Neurologic: alert, answering questions appropriately, following commands Respiratory: unlabored breathing on room air, symmetric chest rise Psychiatric: appropriate affect, normal cadence to speech   MSK (spine):  -Strength exam      Left  Right Grip strength                5/5  5/5 Interosseus   5/5   5/5 Wrist extension  5/5  5/5 Wrist flexion   5/5  5/5 Elbow flexion   5/5  5/5 Deltoid    5/5  5/5  -Sensory exam    Sensation intact to light touch in C5-T1 nerve distributions of bilateral upper extremities  -Brachioradialis DTR: 2/4 on the left, 2/4 on the right -Biceps DTR: 2/4 on the left, 2/4 on the right -Triceps DTR: 2/4 on the left, 2/4 on the right  -Spurling: negative bilaterally -Hoffman sign: positive on the left, negative on the right -Clonus: no beats bilaterally -Interosseous wasting: none seen -Grip and release  test: negative -Romberg: negative -Gait: normal  Left shoulder exam: No pain through range of motion, negative Jobe, negative belly press, no weakness with external rotation with arm at side Right shoulder exam: No pain through range of motion  Imaging: XRs of the cervical spine from 03/08/2023 was independently reviewed and interpreted, showing no significant degenerative changes. No fracture or dislocation. No evidence of instability on  flexion/extension views.   MRI of the cervical spine from 03/01/2023 was independently reviewed and interpreted, showing disc herniation at C5/6 that is mostly in the left paracentral region. There is effacement of the thecal sac at C5/6 on the left side of the canal. The disc herniation is abutting the spinal cord on the left at C5/6. No other stenosis seen. No T2 cord signal change seen.     Patient name: Kim Pham Patient MRN: [id number] Date of visit: 03/08/23

## 2023-03-08 NOTE — Progress Notes (Signed)
  Care Coordination Note  03/08/2023 Name: Kim Pham MRN: [id number] DOB: [birth year]  Kim Pham is a 42 y.o. year old female who is a primary care patient of Sandford Craze, NP and is actively engaged with the care management team. I reached out to Scheryl Marten by phone today to assist with re-scheduling an initial visit with the Licensed Clinical Social Worker  Follow up plan: Unsuccessful telephone outreach attempt made. A HIPAA compliant phone message was left for the patient providing contact information and requesting a return call.  [employer]  Care Coordination Care Guide  Direct Dial: [phone]

## 2023-03-09 ENCOUNTER — Ambulatory Visit (INDEPENDENT_AMBULATORY_CARE_PROVIDER_SITE_OTHER): Payer: BC Managed Care – PPO | Admitting: Behavioral Health

## 2023-03-09 ENCOUNTER — Encounter: Payer: Self-pay | Admitting: Behavioral Health

## 2023-03-09 DIAGNOSIS — F411 Generalized anxiety disorder: Secondary | ICD-10-CM

## 2023-03-09 NOTE — Progress Notes (Signed)
[facility] Counselor/Therapist Progress Note  Patient ID: Kim Pham, MRN: [id number],    Date: 03/09/2023  Time Spent: 54 minutes, 2:00 PM until 2:54 PM this session was held via video teletherapy. The patient consented to the video teletherapy and was located in her home during this session. She is aware it is the responsibility of the patient to secure confidentiality on her end of the session. The provider was in a private home office for the duration of this session.      Treatment Type: Individual Therapy  Reported Symptoms: Anxiety/stress  Mental Status Exam: Appearance:  Well Groomed     Behavior: Appropriate  Motor: Normal  Speech/Language:  Clear and Coherent  Affect: Appropriate  Mood: normal  Thought process: normal  Thought content:   WNL  Sensory/Perceptual disturbances:   WNL  Orientation: oriented to person, place, time/date, situation, day of week, and month of year  Attention: Good  Concentration: Good  Memory: WNL  Fund of knowledge:  Good  Insight:   Good  Judgment:  Good  Impulse Control: Good   Risk Assessment: Danger to Self:  No Self-injurious Behavior: No Danger to Others: No Duty to Warn:no Physical Aggression / Violence:No  Access to Firearms a concern: No  Gang Involvement:No   Subjective: After our last session the patient said she started having significant headaches which she had dressed with her doctor.  MRI showed that she has a bulging cervical disk which is likely the cause of the headaches but is also pressing against her spinal cord which concerns the doctor.  He gave her a round of prednisone to try initially and she goes back in a couple of weeks to get a shot in her neck.  Goals are to tamp down the inflammation with hopes of avoiding any surgery.  Other than feeling tired and achy her back is feeling better and she is thankful for that.  She knows of nothing that triggered disk issues in the cervical area and  knows of no family history.  Burgess Estelle was a fairly difficult day because of a conversation with her sister about some things her sister had found in relation to her mom.  The paperwork that her sister found confirmed with her mom had told him about their dad 40 years but had never shown she or her siblings any proof.  She said that she felt somewhat like an orphan having recently lost her mom and her dad not responding to her.  For the most part her siblings do not have contact with her dad and the patient has tried to maintain that but lately he has coasted her.  He says that he has always had a habit of being avoidance in order to not deal with difficult things and thinks that may be part of the pattern now.  She also wonders if he will other parents are divorced if he is grieving her mom's death.  The patient said that was nothing stressful about yesterday but she thought that she might would like to try a glass of wine.  She said it did not have the same appeal as that it had in the past and she is thankful for that but also made her a little emotionally charged and she had brief thoughts of driving over to her father's house to have a serious conversation with him.  She was able to think beyond the emotions and choose not to do that.  Her husband was also very encouraging  and matter-of-fact with her which she appreciated.  She is using her coping skills but says she is going to use some of next week to spend time with family for Thanksgiving is especially looking forward to her son coming home. Encouraged her to keep journaling, relying on her faith, getting outside to walk, spending time with the people that she cares about and to allow herself to grieve. She does contract for safety having no thoughts of hurting herself or anyone else.  Interventions: Cognitive Behavioral Therapy  Diagnosis: Generalized anxiety disorder  Plan: I will meet with the patient every 2 weeks virtually Target date:  January 17, 2023 Progress: 30%  The patient has had 2 surgeries as well as her mom's death so had not seen her in several months.  I did review the target goals and a new target date for continuing above-stated goals is July 18, 2023. [name], [facility]                 [name], [facility]               [name], [facility]               [name], [facility]               [name], [facility]               [name], [facility]

## 2023-03-14 ENCOUNTER — Inpatient Hospital Stay (HOSPITAL_BASED_OUTPATIENT_CLINIC_OR_DEPARTMENT_OTHER): Payer: BC Managed Care – PPO | Admitting: Hematology & Oncology

## 2023-03-14 ENCOUNTER — Ambulatory Visit: Payer: BC Managed Care – PPO | Admitting: Orthopedic Surgery

## 2023-03-14 ENCOUNTER — Encounter: Payer: Self-pay | Admitting: Hematology & Oncology

## 2023-03-14 ENCOUNTER — Inpatient Hospital Stay: Payer: BC Managed Care – PPO | Attending: Hematology & Oncology

## 2023-03-14 ENCOUNTER — Other Ambulatory Visit: Payer: Self-pay

## 2023-03-14 VITALS — BP 111/71 | HR 81 | Temp 98.1°F | Resp 18 | Ht 69.0 in | Wt 183.0 lb

## 2023-03-14 DIAGNOSIS — I2782 Chronic pulmonary embolism: Secondary | ICD-10-CM | POA: Diagnosis not present

## 2023-03-14 DIAGNOSIS — D509 Iron deficiency anemia, unspecified: Secondary | ICD-10-CM

## 2023-03-14 DIAGNOSIS — Z86711 Personal history of pulmonary embolism: Secondary | ICD-10-CM | POA: Insufficient documentation

## 2023-03-14 DIAGNOSIS — I2609 Other pulmonary embolism with acute cor pulmonale: Secondary | ICD-10-CM | POA: Diagnosis not present

## 2023-03-14 DIAGNOSIS — D6852 Prothrombin gene mutation: Secondary | ICD-10-CM | POA: Diagnosis not present

## 2023-03-14 DIAGNOSIS — Z7901 Long term (current) use of anticoagulants: Secondary | ICD-10-CM | POA: Insufficient documentation

## 2023-03-14 LAB — FERRITIN: Ferritin: 12 ng/mL (ref 11–307)

## 2023-03-14 LAB — CBC WITH DIFFERENTIAL (CANCER CENTER ONLY)
Abs Immature Granulocytes: 0.03 10*3/uL (ref 0.00–0.07)
Basophils Absolute: 0.1 10*3/uL (ref 0.0–0.1)
Basophils Relative: 1 %
Eosinophils Absolute: 0.1 10*3/uL (ref 0.0–0.5)
Eosinophils Relative: 1 %
HCT: 41.9 % (ref 36.0–46.0)
Hemoglobin: 13.5 g/dL (ref 12.0–15.0)
Immature Granulocytes: 0 %
Lymphocytes Relative: 36 %
Lymphs Abs: 3.1 10*3/uL (ref 0.7–4.0)
MCH: 28.4 pg (ref 26.0–34.0)
MCHC: 32.2 g/dL (ref 30.0–36.0)
MCV: 88.2 fL (ref 80.0–100.0)
Monocytes Absolute: 0.6 10*3/uL (ref 0.1–1.0)
Monocytes Relative: 7 %
Neutro Abs: 4.7 10*3/uL (ref 1.7–7.7)
Neutrophils Relative %: 55 %
Platelet Count: 307 10*3/uL (ref 150–400)
RBC: 4.75 MIL/uL (ref 3.87–5.11)
RDW: 12.9 % (ref 11.5–15.5)
WBC Count: 8.6 10*3/uL (ref 4.0–10.5)
nRBC: 0 % (ref 0.0–0.2)

## 2023-03-14 LAB — CMP (CANCER CENTER ONLY)
ALT: 18 U/L (ref 0–44)
AST: 14 U/L — ABNORMAL LOW (ref 15–41)
Albumin: 4.5 g/dL (ref 3.5–5.0)
Alkaline Phosphatase: 103 U/L (ref 38–126)
Anion gap: 9 (ref 5–15)
BUN: 12 mg/dL (ref 6–20)
CO2: 31 mmol/L (ref 22–32)
Calcium: 10.4 mg/dL — ABNORMAL HIGH (ref 8.9–10.3)
Chloride: 103 mmol/L (ref 98–111)
Creatinine: 0.84 mg/dL (ref 0.44–1.00)
GFR, Estimated: >60 mL/min (ref >60)
Glucose, Bld: 90 mg/dL (ref 70–99)
Potassium: 4.5 mmol/L (ref 3.5–5.1)
Sodium: 143 mmol/L (ref 135–145)
Total Bilirubin: 0.3 mg/dL (ref <1.2)
Total Protein: 7.1 g/dL (ref 6.5–8.1)

## 2023-03-14 LAB — IRON AND IRON BINDING CAPACITY (CC-WL,HP ONLY)
Iron: 47 ug/dL (ref 28–170)
Saturation Ratios: 10 % — ABNORMAL LOW (ref 10.4–31.8)
TIBC: 456 ug/dL — ABNORMAL HIGH (ref 250–450)
UIBC: 409 ug/dL (ref 148–442)

## 2023-03-14 NOTE — Progress Notes (Signed)
Hematology and Oncology Follow Up Visit  Kim Pham [id number] 08-24-1980 42 y.o. 03/14/2023   Principle Diagnosis:  Acute pulmonary embolism diagnosed on 05/11/2022 - resolved on repeat CTA 06/23/2022 Heterozygous for Prothrombin II gene mutation    Current Therapy:        Eliquis 5 mg PO BID   Interim History:  Kim Pham is here today for follow-up.  Unfortunately, she did not have a problem with her neck.  She had back surgery back in the summertime.  She has had 2 back surgeries.  The last 1 was back in September.  She now is having problems at C5-C6.  She is going to have an epidural injection done.  If this does not work, she probably need to have fusion.  She has some tingling down the left arm.  Otherwise, she is doing quite well.  Unfortunately, her mother passed away unexpectedly 7 weeks ago.  I am so sad to hear this.  Kim Pham seems to be getting through this.  She has a good family.  She will be had a I think sister-in-law's for Thanksgiving.  She has had no problem with bowels or bladder.  She has had no cough or shortness of breath.  There has been no chest wall pain..  She is incredibly busy working for [employer] of the [place].   Currently, I would say that her performance status is probably ECOG 1.   Medications:  Allergies as of 03/14/2023       Reactions   Naltrexone Nausea Only   Rosuvastatin Nausea Only   Tape Itching, Other (See Comments)   Skin itches if tape is left on for awhile   Tramadol Nausea And Vomiting   Effexor [venlafaxine] Nausea Only, Rash   Norco [hydrocodone-acetaminophen] Rash   Voltaren [diclofenac] Rash        Medication List        Accurate as of March 14, 2023 12:05 PM. If you have any questions, ask your nurse or doctor.          atorvastatin 20 MG tablet Commonly known as: LIPITOR TAKE 1 TABLET BY MOUTH DAILY   cyanocobalamin 1000 MCG/ML injection Commonly known as: VITAMIN B12 Inject 1 ML into muscle  once weekly for 4 weeks then once monthly.   cyclobenzaprine 10 MG tablet Commonly known as: FLEXERIL Take 1 tablet (10 mg total) by mouth 3 (three) times daily as needed for muscle spasms.   Eliquis 5 MG Tabs tablet Generic drug: apixaban Take 1 tablet (5 mg total) by mouth 2 (two) times daily.   famotidine 20 MG tablet Commonly known as: PEPCID Take 20 mg by mouth at bedtime.   gabapentin 300 MG capsule Commonly known as: NEURONTIN TAKE 1 CAPSULE BY MOUTH 3 TIMES A DAY   HYDROcodone-acetaminophen 5-325 MG tablet Commonly known as: NORCO/VICODIN Take 1 tablet by mouth every 6 (six) hours as needed for moderate pain (pain score 4-6).   levothyroxine 50 MCG tablet Commonly known as: SYNTHROID TAKE 1 TABLET BY MOUTH DAILY BEFORE BREAKFAST   Melatonin 10 MG Tabs Take 20 mg by mouth at bedtime.   methylPREDNISolone 4 MG Tbpk tablet Commonly known as: MEDROL DOSEPAK Take as prescribed on the box   multivitamin with minerals Tabs tablet Take 1 tablet by mouth daily.   ondansetron 4 MG disintegrating tablet Commonly known as: ZOFRAN-ODT Take 1 tablet (4 mg total) by mouth every 8 (eight) hours as needed.   pantoprazole 40 MG tablet Commonly known  as: PROTONIX Take 40 mg by mouth 2 (two) times daily.   sertraline 50 MG tablet Commonly known as: ZOLOFT Take 1.5 tablets (75 mg total) by mouth daily.   SUMAtriptan 100 MG tablet Commonly known as: IMITREX Take 1 tablet (100 mg total) by mouth at onset of migraine. May repeat in 2 hours if headache persists or recurs. Do not take more than 2 doses in 24 hours.   SYRINGE 3CC/21GX1-1/2" 21G X 1-1/2" 3 ML Misc Use as directed   traZODone 100 MG tablet Commonly known as: DESYREL TAKE TWO TABLETS BY MOUTH EVERY NIGHT AT BEDTIME        Allergies:  Allergies  Allergen Reactions   Naltrexone Nausea Only   Rosuvastatin Nausea Only   Tape Itching and Other (See Comments)    Skin itches if tape is left on for awhile    Tramadol Nausea And Vomiting   Effexor [Venlafaxine] Nausea Only and Rash   Norco [Hydrocodone-Acetaminophen] Rash   Voltaren [Diclofenac] Rash    Past Medical History, Surgical history, Social history, and Family History were reviewed and updated.  Review of Systems: Review of Systems  Constitutional: Negative.   HENT: Negative.    Eyes: Negative.   Respiratory: Negative.    Cardiovascular: Negative.   Gastrointestinal: Negative.   Genitourinary: Negative.   Musculoskeletal:  Positive for neck pain.  Skin: Negative.   Neurological:  Positive for tingling.  Endo/Heme/Allergies: Negative.   Psychiatric/Behavioral: Negative.        Physical Exam:  height is 5\' 9"  (1.753 m) and weight is 183 lb (83 kg). Her oral temperature is 98.1 F (36.7 C). Her blood pressure is 111/71 and her pulse is 81. Her respiration is 18 and oxygen saturation is 100%.   Wt Readings from Last 3 Encounters:  03/14/23 183 lb (83 kg)  02/25/23 184 lb 12.8 oz (83.8 kg)  02/15/23 185 lb (83.9 kg)   Physical Exam Vitals reviewed.  HENT:     Head: Normocephalic and atraumatic.  Eyes:     Pupils: Pupils are equal, round, and reactive to light.  Cardiovascular:     Rate and Rhythm: Normal rate and regular rhythm.     Heart sounds: Normal heart sounds.  Pulmonary:     Effort: Pulmonary effort is normal.     Breath sounds: Normal breath sounds.  Abdominal:     General: Bowel sounds are normal.     Palpations: Abdomen is soft.  Musculoskeletal:        General: No tenderness or deformity. Normal range of motion.     Cervical back: Normal range of motion.  Lymphadenopathy:     Cervical: No cervical adenopathy.  Skin:    General: Skin is warm and dry.     Findings: No erythema or rash.  Neurological:     Mental Status: She is alert and oriented to person, place, and time.  Psychiatric:        Behavior: Behavior normal.        Thought Content: Thought content normal.        Judgment: Judgment  normal.      Lab Results  Component Value Date   WBC 8.6 03/14/2023   HGB 13.5 03/14/2023   HCT 41.9 03/14/2023   MCV 88.2 03/14/2023   PLT 307 03/14/2023   Lab Results  Component Value Date   FERRITIN 13 (L) 02/15/2023   IRON 84 02/15/2023   TIBC 383 02/15/2023   IRONPCTSAT 22 02/15/2023   Lab  Results  Component Value Date   RBC 4.75 03/14/2023   No results found for: "KPAFRELGTCHN", "LAMBDASER", "KAPLAMBRATIO" No results found for: "IGGSERUM", "IGA", "IGMSERUM" No results found for: "TOTALPROTELP", "ALBUMINELP", "A1GS", "A2GS", "BETS", "BETA2SER", "GAMS", "MSPIKE", "SPEI"   Chemistry      Component Value Date/Time   NA 143 03/14/2023 1015   NA 143 01/01/2016 0841   K 4.5 03/14/2023 1015   CL 103 03/14/2023 1015   CO2 31 03/14/2023 1015   BUN 12 03/14/2023 1015   BUN 12 01/01/2016 0841   CREATININE 0.84 03/14/2023 1015   CREATININE 0.70 10/22/2022 1425      Component Value Date/Time   CALCIUM 10.4 (H) 03/14/2023 1015   ALKPHOS 103 03/14/2023 1015   AST 14 (L) 03/14/2023 1015   ALT 18 03/14/2023 1015   BILITOT 0.3 03/14/2023 1015       Impression and Plan:  Kim Pham is a pleasant 42 yo caucasian female heterozygous for the prothrombin gene mutation with history of PE diagnosed in 04/2022.   We will keep her on Eliquis right now.  Again, it sounds like she is going to need to have cervical spine surgery.  From my point of view, I do not see a problem with her having this.  We will just hold the Eliquis for 2 or 3 days prior to surgery.  We will go ahead and plan to get her back to see [place] in another 3 months.   Josph Macho, MD 11/25/202412:05 PM

## 2023-03-15 ENCOUNTER — Encounter: Payer: Self-pay | Admitting: *Deleted

## 2023-03-16 ENCOUNTER — Encounter (HOSPITAL_BASED_OUTPATIENT_CLINIC_OR_DEPARTMENT_OTHER): Payer: Self-pay

## 2023-03-16 ENCOUNTER — Emergency Department (HOSPITAL_BASED_OUTPATIENT_CLINIC_OR_DEPARTMENT_OTHER)
Admission: EM | Admit: 2023-03-16 | Discharge: 2023-03-16 | Disposition: A | Discharge status: Home / Self Care | Payer: BC Managed Care – PPO | Attending: Emergency Medicine | Admitting: Emergency Medicine

## 2023-03-16 ENCOUNTER — Emergency Department (HOSPITAL_BASED_OUTPATIENT_CLINIC_OR_DEPARTMENT_OTHER): Payer: BC Managed Care – PPO

## 2023-03-16 ENCOUNTER — Encounter: Payer: Self-pay | Admitting: Orthopedic Surgery

## 2023-03-16 DIAGNOSIS — Y92410 Unspecified street and highway as the place of occurrence of the external cause: Secondary | ICD-10-CM | POA: Insufficient documentation

## 2023-03-16 DIAGNOSIS — Z79899 Other long term (current) drug therapy: Secondary | ICD-10-CM | POA: Insufficient documentation

## 2023-03-16 DIAGNOSIS — S0990XA Unspecified injury of head, initial encounter: Secondary | ICD-10-CM | POA: Diagnosis not present

## 2023-03-16 DIAGNOSIS — Z7901 Long term (current) use of anticoagulants: Secondary | ICD-10-CM | POA: Insufficient documentation

## 2023-03-16 DIAGNOSIS — S161XXA Strain of muscle, fascia and tendon at neck level, initial encounter: Secondary | ICD-10-CM | POA: Diagnosis not present

## 2023-03-16 DIAGNOSIS — S199XXA Unspecified injury of neck, initial encounter: Secondary | ICD-10-CM | POA: Diagnosis not present

## 2023-03-16 MED ORDER — HYDROMORPHONE HCL 1 MG/ML IJ SOLN
1.0000 mg | Freq: Once | INTRAMUSCULAR | Status: AC
  Administered 2023-03-16: 1 mg via INTRAMUSCULAR
  Filled 2023-03-16: qty 1
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
## 2023-03-16 NOTE — ED Notes (Signed)

## 2023-03-21 ENCOUNTER — Other Ambulatory Visit: Payer: Self-pay

## 2023-03-21 ENCOUNTER — Ambulatory Visit: Payer: BC Managed Care – PPO | Admitting: Physical Medicine and Rehabilitation

## 2023-03-21 DIAGNOSIS — M5412 Radiculopathy, cervical region: Secondary | ICD-10-CM

## 2023-03-21 MED ORDER — METHYLPREDNISOLONE ACETATE 40 MG/ML IJ SUSP
40.0000 mg | Freq: Once | INTRAMUSCULAR | Status: AC
  Administered 2023-03-21: 40 mg

## 2023-03-21 NOTE — Progress Notes (Signed)
LASHAWNDRA JANKIEWICZ - 42 y.o. female MRN [id number]  Date of birth: Sep 04, 1980  Office Visit Note: Visit Date: 03/21/2023 PCP: Sandford Craze, NP Referred by: London Sheer, MD  Subjective: Chief Complaint  Patient presents with   Neck - Pain   HPI:  SAYLAH RIBELIN is a 42 y.o. female who comes in today at the request of Dr. Willia Craze for planned Left C7-T1 Cervical Interlaminar epidural steroid injection with fluoroscopic guidance.  The patient has failed conservative care including home exercise, medications, time and activity modification.  This injection will be diagnostic and hopefully therapeutic.  Please see requesting physician notes for further details and justification.   ROS Otherwise per HPI.  Assessment & Plan: Visit Diagnoses:    ICD-10-CM   1. Cervical radiculopathy  M54.12 XR C-ARM NO REPORT    Epidural Steroid injection    methylPREDNISolone acetate (DEPO-MEDROL) injection 40 mg      Plan: No additional findings.   Meds & Orders:  Meds ordered this encounter  Medications   methylPREDNISolone acetate (DEPO-MEDROL) injection 40 mg    Orders Placed This Encounter  Procedures   XR C-ARM NO REPORT   Epidural Steroid injection    Follow-up: Return for visit to requesting provider as needed.   Procedures: No procedures performed  Cervical Epidural Steroid Injection - Interlaminar Approach with Fluoroscopic Guidance  Patient: PEOLA MELLEY      Date of Birth: Oct 22, 1980 MRN: [id number] PCP: Sandford Craze, NP      Visit Date: 03/21/2023   Universal Protocol:    Date/Time: 12/11/248:08 AM  Consent Given By: the patient  Position: PRONE  Additional Comments: Vital signs were monitored before and after the procedure. Patient was prepped and draped in the usual sterile fashion. The correct patient, procedure, and site was verified.   Injection Procedure Details:   Procedure diagnoses: Cervical radiculopathy [M54.12]    Meds  Administered:  Meds ordered this encounter  Medications   methylPREDNISolone acetate (DEPO-MEDROL) injection 40 mg     Laterality: Left  Location/Site: C7-T1  Needle: 3.5 in., 20 ga. Tuohy  Needle Placement: Paramedian epidural space  Findings:  -Comments: Excellent flow of contrast into the epidural space.  Procedure Details: Using a paramedian approach from the side mentioned above, the region overlying the inferior lamina was localized under fluoroscopic visualization and the soft tissues overlying this structure were infiltrated with 4 ml. of 1% Lidocaine without Epinephrine. A # 20 gauge, Tuohy needle was inserted into the epidural space using a paramedian approach.  The epidural space was localized using loss of resistance along with contralateral oblique bi-planar fluoroscopic views.  After negative aspirate for air, blood, and CSF, a 2 ml. volume of Isovue-250 was injected into the epidural space and the flow of contrast was observed. Radiographs were obtained for documentation purposes.   The injectate was administered into the level noted above.  Additional Comments:  No complications occurred Dressing: 2 x 2 sterile gauze and Band-Aid    Post-procedure details: Patient was observed during the procedure. Post-procedure instructions were reviewed.  Patient left the clinic in stable condition.   Clinical History: EXAM: MRI LUMBAR SPINE WITHOUT CONTRAST   TECHNIQUE: Multiplanar, multisequence MR imaging of the lumbar spine was performed. No intravenous contrast was administered.   COMPARISON:  Radiograph from 08/09/2022.   FINDINGS: Segmentation: Standard. Lowest well-formed disc space labeled the L5-S1 level.   Alignment: Trace levoscoliosis. Alignment otherwise normal with preservation of the normal lumbar lordosis. No significant  listhesis.   Vertebrae: Vertebral body height maintained without acute or chronic fracture. Bone marrow signal intensity  within normal limits. No discrete or worrisome osseous lesions. No abnormal marrow edema.   Conus medullaris and cauda equina: Conus extends to the L2-3 level. Conus and cauda equina appear normal.   Paraspinal and other soft tissues: Unremarkable.   Disc levels:   L1-2: Mild disc bulge. Mild bilateral facet hypertrophy. No spinal stenosis. Foramina remain patent.   L2-3: Mild annular disc bulge. Mild right greater than left facet hypertrophy. No spinal stenosis. Foramina remain patent.   L3-4: Mild diffuse disc bulge. Superimposed left foraminal to extraforaminal disc protrusion contacts the exiting left L3 nerve root. Mild facet and ligament flavum hypertrophy. Resultant mild spinal stenosis. Foramina remain adequately patent.   L4-5: Disc desiccation with mild disc bulge. Right foraminal to extraforaminal disc protrusion with annular fissure contacts the exiting right L4 nerve root (series 8, image 30). Mild to moderate bilateral facet hypertrophy. Resultant mild narrowing of the right lateral recess. Central canal remains patent. Mild bilateral L4 foraminal stenosis.   L5-S1: Disc desiccation with minimal annular disc bulge. No canal or lateral recess stenosis. Foramina remain patent.   IMPRESSION: 1. Left foraminal to extraforaminal disc protrusion at L3-4, contacting the exiting left L3 nerve root. 2. Right foraminal to extraforaminal disc protrusion at L4-5, contacting the exiting right L4 nerve root. 3. Lower lumbar facet hypertrophy, most pronounced at L3-4 and L4-5. Finding could contribute to lower back pain.     Electronically Signed   By: Rise Mu M.D.   On: 08/10/2022 21:39     Objective:  VS:  HT:    WT:   BMI:     BP:   HR: bpm  TEMP: ( )  RESP:  Physical Exam Vitals and nursing note reviewed.  Constitutional:      General: She is not in acute distress.    Appearance: Normal appearance. She is not ill-appearing.  HENT:     Head:  Normocephalic and atraumatic.     Right Ear: External ear normal.     Left Ear: External ear normal.  Eyes:     Extraocular Movements: Extraocular movements intact.  Cardiovascular:     Rate and Rhythm: Normal rate.     Pulses: Normal pulses.  Musculoskeletal:     Cervical back: Tenderness present. No rigidity.     Right lower leg: No edema.     Left lower leg: No edema.     Comments: Patient has good strength in the upper extremities including 5 out of 5 strength in wrist extension long finger flexion and APB.  There is no atrophy of the hands intrinsically.  There is a negative Hoffmann's test.   Lymphadenopathy:     Cervical: No cervical adenopathy.  Skin:    Findings: No erythema, lesion or rash.  Neurological:     General: No focal deficit present.     Mental Status: She is alert and oriented to person, place, and time.     Sensory: No sensory deficit.     Motor: No weakness or abnormal muscle tone.     Coordination: Coordination normal.  Psychiatric:        Mood and Affect: Mood normal.        Behavior: Behavior normal.      Imaging: No results found.

## 2023-03-21 NOTE — Progress Notes (Signed)
  Care Coordination Note  03/21/2023 Name: Kim Pham MRN: [id number] DOB: 12-07-1980  Kim Pham is a 42 y.o. year old female who is a primary care patient of Sandford Craze, NP and is actively engaged with the care management team. I reached out to Scheryl Marten by phone today to assist with re-scheduling an initial visit with the Licensed Clinical Social Worker  Follow up plan: Patient declines further follow up and engagement by the care management team. Appropriate care team members and provider have been notified via electronic communication.   [facility]  Care Coordination Care Guide  Direct Dial: [phone]

## 2023-03-21 NOTE — Patient Instructions (Signed)

## 2023-03-21 NOTE — Progress Notes (Signed)
Functional Pain Scale - descriptive words and definitions  Distracting (5)    Aware of pain/able to complete some ADL's but limited by pain/sleep is affected and active distractions are only slightly useful. Moderate range order  Average Pain 6 114/73 Bil numbness / tingling in hands L>R  +Driver, -BT, -Dye Allergies.

## 2023-03-22 ENCOUNTER — Encounter: Payer: Self-pay | Admitting: Family

## 2023-03-22 ENCOUNTER — Ambulatory Visit (INDEPENDENT_AMBULATORY_CARE_PROVIDER_SITE_OTHER): Payer: BC Managed Care – PPO | Admitting: Family

## 2023-03-22 ENCOUNTER — Ambulatory Visit: Payer: BC Managed Care – PPO | Admitting: Family

## 2023-03-22 VITALS — BP 109/64 | HR 83 | Temp 98.8°F | Resp 16 | Ht 69.0 in | Wt 187.0 lb

## 2023-03-22 DIAGNOSIS — E038 Other specified hypothyroidism: Secondary | ICD-10-CM

## 2023-03-22 DIAGNOSIS — E041 Nontoxic single thyroid nodule: Secondary | ICD-10-CM | POA: Diagnosis not present

## 2023-03-22 DIAGNOSIS — E611 Iron deficiency: Secondary | ICD-10-CM

## 2023-03-22 DIAGNOSIS — E782 Mixed hyperlipidemia: Secondary | ICD-10-CM

## 2023-03-22 DIAGNOSIS — E538 Deficiency of other specified B group vitamins: Secondary | ICD-10-CM | POA: Diagnosis not present

## 2023-03-22 DIAGNOSIS — F418 Other specified anxiety disorders: Secondary | ICD-10-CM

## 2023-03-22 MED ORDER — SERTRALINE HCL 100 MG PO TABS
100.0000 mg | ORAL_TABLET | Freq: Every day | ORAL | 1 refills | Status: DC
Start: 2023-03-22 — End: 2023-08-03
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
## 2023-03-23 ENCOUNTER — Other Ambulatory Visit (HOSPITAL_BASED_OUTPATIENT_CLINIC_OR_DEPARTMENT_OTHER): Payer: BC Managed Care – PPO

## 2023-03-23 ENCOUNTER — Ambulatory Visit (HOSPITAL_BASED_OUTPATIENT_CLINIC_OR_DEPARTMENT_OTHER)
Admission: RE | Admit: 2023-03-23 | Discharge: 2023-03-23 | Disposition: A | Discharge status: Home / Self Care | Payer: BC Managed Care – PPO | Source: Ambulatory Visit | Attending: Family | Admitting: Family

## 2023-03-23 DIAGNOSIS — E041 Nontoxic single thyroid nodule: Secondary | ICD-10-CM | POA: Insufficient documentation

## 2023-03-23 DIAGNOSIS — E611 Iron deficiency: Secondary | ICD-10-CM | POA: Insufficient documentation

## 2023-03-23 LAB — LIPID PANEL
Cholesterol: 284 mg/dL — ABNORMAL HIGH (ref 0–200)
HDL: 85.4 mg/dL (ref >39.00)
LDL Cholesterol: 177 mg/dL — ABNORMAL HIGH (ref 0–99)
NonHDL: 198.3
Total CHOL/HDL Ratio: 3
Triglycerides: 106 mg/dL (ref 0.0–149.0)
VLDL: 21.2 mg/dL (ref 0.0–40.0)

## 2023-03-23 LAB — VITAMIN B12: Vitamin B-12: 525 pg/mL (ref 211–911)

## 2023-03-23 LAB — PTH, INTACT AND CALCIUM
Calcium: 10.2 mg/dL (ref 8.6–10.2)
PTH: 25 pg/mL (ref 16–77)

## 2023-03-23 NOTE — Assessment & Plan Note (Signed)
  Patient reports some improvement with increased dose of Sertraline to 75mg , but still experiencing symptoms, particularly with recent stressors. Patient has self-increased dose to 100mg  with noted improvement. -Continue Sertraline to 100mg  daily. -Continue Xanax as needed, with a plan to taper.

## 2023-03-23 NOTE — Assessment & Plan Note (Signed)
  Previous elevated cholesterol despite being on Lipitor. -Check cholesterol levels today. If still above goal, plan to increase Lipitor dose.

## 2023-03-23 NOTE — Assessment & Plan Note (Signed)
  Patient is on B12 injections at home, reports feeling more alert and energetic after injections, but effects seem to wane towards end of month. -Check B12 levels today. If low, consider increasing frequency of injections to every two weeks.

## 2023-03-23 NOTE — Telephone Encounter (Signed)
Is it possible to add on the labs I have pended to yesterday's lab draw please?

## 2023-03-23 NOTE — Assessment & Plan Note (Signed)
  Previous ultrasound identified a cluster of nodules, with a recommendation for repeat ultrasound in one year. -Order repeat thyroid ultrasound.

## 2023-03-23 NOTE — Assessment & Plan Note (Signed)
  Patient reports low iron levels identified by another provider, with a recommendation for over-the-counter iron supplementation. Patient has had previous intolerance to iron supplements. -Recommend trial of Slow Fe for iron supplementation due to its gentler effect on the stomach.

## 2023-03-23 NOTE — Patient Instructions (Signed)
VISIT SUMMARY:  During today's visit, we discussed your ongoing mental health concerns, including anxiety and depression, as well as other health issues such as iron deficiency, thyroid nodules, hyperlipidemia, and vitamin B12 deficiency. We reviewed your current medications and made some adjustments to better manage your symptoms. We also discussed your recent neck pain and the steps to take moving forward.  YOUR PLAN:  -ANXIETY AND DEPRESSION: Anxiety and depression are mental health conditions that can cause feelings of worry, sadness, and loss of interest in daily activities. We have increased your Sertraline dose to 100mg  daily to help manage your symptoms better. Continue taking Xanax as needed, but we will plan to taper it gradually.  -IRON DEFICIENCY: Iron deficiency occurs when your body doesn't have enough iron, leading to fatigue and weakness. We recommend trying Slow Fe, an over-the-counter iron supplement that is gentler on the stomach.  -THYROID NODULES: Thyroid nodules are growths in the thyroid gland that can sometimes cause health issues. We will order a repeat thyroid ultrasound to monitor these nodules.  -HYPERLIPIDEMIA: Hyperlipidemia is a condition where there are high levels of fats (lipids) in the blood, which can increase the risk of heart disease. We will check your cholesterol levels today and may increase your Lipitor dose if needed.  -VITAMIN B12 DEFICIENCY: Vitamin B12 deficiency can cause fatigue and weakness. You are currently receiving B12 injections at home. We will check your B12 levels today and may consider increasing the frequency of your injections to every two weeks if your levels are low.  -GENERAL HEALTH MAINTENANCE: You have received your latest COVID booster. We will follow up in three months to reassess your mood and response to the increased Sertraline dose.  INSTRUCTIONS:  Please follow up in three months to reassess your mood and response to the  increased Sertraline dose. Additionally, we will check your cholesterol and B12 levels today. If your cholesterol levels are still high, we may increase your Lipitor dose. If your B12 levels are low, we may increase the frequency of your B12 injections to every two weeks.

## 2023-03-24 ENCOUNTER — Other Ambulatory Visit: Payer: Self-pay | Admitting: Family

## 2023-03-24 ENCOUNTER — Ambulatory Visit (INDEPENDENT_AMBULATORY_CARE_PROVIDER_SITE_OTHER): Payer: BC Managed Care – PPO

## 2023-03-24 ENCOUNTER — Encounter: Payer: Self-pay | Admitting: Family

## 2023-03-24 DIAGNOSIS — E038 Other specified hypothyroidism: Secondary | ICD-10-CM

## 2023-03-24 MED ORDER — ATORVASTATIN CALCIUM 40 MG PO TABS: 40.0000 mg | ORAL_TABLET | Freq: Every day | ORAL | 1 refills | Status: AC

## 2023-03-24 NOTE — Telephone Encounter (Signed)
Add on request faxed to [facility]. 

## 2023-03-25 ENCOUNTER — Ambulatory Visit: Payer: BC Managed Care – PPO | Admitting: Behavioral Health

## 2023-03-25 LAB — TSH: TSH: 0.73 u[IU]/mL (ref 0.35–5.50)

## 2023-03-25 LAB — T3, FREE: T3, Free: 3.1 pg/mL (ref 2.3–4.2)

## 2023-03-25 LAB — T4, FREE: Free T4: 1.11 ng/dL (ref 0.60–1.60)

## 2023-03-28 ENCOUNTER — Encounter: Payer: Self-pay | Admitting: Behavioral Health

## 2023-03-28 ENCOUNTER — Ambulatory Visit: Payer: BC Managed Care – PPO | Admitting: Behavioral Health

## 2023-03-28 DIAGNOSIS — F411 Generalized anxiety disorder: Secondary | ICD-10-CM | POA: Diagnosis not present

## 2023-03-28 DIAGNOSIS — F418 Other specified anxiety disorders: Secondary | ICD-10-CM
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
## 2023-03-29 ENCOUNTER — Ambulatory Visit: Payer: BC Managed Care – PPO | Admitting: Family

## 2023-03-30 ENCOUNTER — Other Ambulatory Visit (INDEPENDENT_AMBULATORY_CARE_PROVIDER_SITE_OTHER): Payer: BC Managed Care – PPO

## 2023-03-30 ENCOUNTER — Ambulatory Visit (INDEPENDENT_AMBULATORY_CARE_PROVIDER_SITE_OTHER): Payer: BC Managed Care – PPO | Admitting: Orthopedic Surgery

## 2023-03-30 DIAGNOSIS — Z981 Arthrodesis status: Secondary | ICD-10-CM

## 2023-03-30 NOTE — Procedures (Signed)
Cervical Epidural Steroid Injection - Interlaminar Approach with Fluoroscopic Guidance  Patient: Kim Pham      Date of Birth: 1980/11/06 MRN: [id number] PCP: Sandford Craze, NP      Visit Date: 03/21/2023   Universal Protocol:    Date/Time: 12/11/248:08 AM  Consent Given By: the patient  Position: PRONE  Additional Comments: Vital signs were monitored before and after the procedure. Patient was prepped and draped in the usual sterile fashion. The correct patient, procedure, and site was verified.   Injection Procedure Details:   Procedure diagnoses: Cervical radiculopathy [M54.12]    Meds Administered:  Meds ordered this encounter  Medications   methylPREDNISolone acetate (DEPO-MEDROL) injection 40 mg     Laterality: Left  Location/Site: C7-T1  Needle: 3.5 in., 20 ga. Tuohy  Needle Placement: Paramedian epidural space  Findings:  -Comments: Excellent flow of contrast into the epidural space.  Procedure Details: Using a paramedian approach from the side mentioned above, the region overlying the inferior lamina was localized under fluoroscopic visualization and the soft tissues overlying this structure were infiltrated with 4 ml. of 1% Lidocaine without Epinephrine. A # 20 gauge, Tuohy needle was inserted into the epidural space using a paramedian approach.  The epidural space was localized using loss of resistance along with contralateral oblique bi-planar fluoroscopic views.  After negative aspirate for air, blood, and CSF, a 2 ml. volume of Isovue-250 was injected into the epidural space and the flow of contrast was observed. Radiographs were obtained for documentation purposes.   The injectate was administered into the level noted above.  Additional Comments:  No complications occurred Dressing: 2 x 2 sterile gauze and Band-Aid    Post-procedure details: Patient was observed during the procedure. Post-procedure instructions were  reviewed.  Patient left the clinic in stable condition.

## 2023-03-30 NOTE — Progress Notes (Signed)
Orthopedic Surgery Office Visit   Procedure:  L4/5 right-sided far lateral discectomy L4/5 transfacet far lateral discectomy, L4/5 TLIF, L4/5 PSIF Date of Surgery: 11/08/2022, 12/24/2022 (~3 months from second surgery)   Assessment: Patient is a 42 y.o. who has had resolution of her right leg pain since surgery. Periodically has some right buttock pain but it resolves on its own accord and is tolerable. Now is having more pain in her neck radiating into her left upper extremity. Has disc herniation at C5/6 that is indenting the ventral cord on the left side   Plan: -Operative plans complete -No spine specific precautions -Can continue with gabapentin and Tylenol for her neck and radiating arm pain -Treatments tried for her radiculopathy: Tylenol, gabapentin, Medrol Dosepak, cervical steroid injection -Return to office in 4 weeks, x-rays needed at next visit: none   ___________________________________________________________________________     Subjective: Patient is still doing well from her lumbar surgery.  She is not having any pain radiating into her right lower extremity.  She is satisfied with her outcome of the lumbar spine surgery.  However, she continues to have neck pain that radiates into the left upper extremity.  She notes that this pain is severe.  She feels it on a daily basis.  It starts in her neck and radiates into the lateral aspect of the arm and into the radial forearm on the left side.  She is not having any pain radiating into the right upper extremity.  She continues to have headaches that she said started once this pain started.  She still has decreased sensation in her thumb and index finger on the left side.  No other numbness or paresthesias.  After last visit, she had a cervical ESI that provided her with a couple days of relief but then pain came back.   Objective:   General: no acute distress, appropriate affect Neurologic: alert, answering questions  appropriately, following commands Respiratory: unlabored breathing on room air Skin: Incisions are well-healed with no erythema, induration, active/expressible drainage    MSK (spine):   -Strength exam                                                   Left                  Right  Grip strength               5/5                  5/5 Interosseus                  5/5                  5/5 Wrist extension            5/5                  5/5 Wrist flexion                 5/5                  5/5 Elbow flexion                5/5  5/5 Deltoid                          5/5                  5/5  EHL                              5/5                  5/5 TA                                 5/5                  5/5 GSC                             5/5                  5/5 Knee extension            5/5                  5/5 Hip flexion                    5/5                  5/5   -Sensory exam                Sensation intact to light touch in C5-T1 nerve distributions of bilateral upper extremities (decreased in C6 distribution on the left)             Sensation intact to light touch in L3-S1 nerve distributions of bilateral lower extremities   Imaging: X-rays of the lumbar spine taken 03/30/2023 were independently reviewed and interpreted, showing L4 and L5 posterior instrumentation in appropriate position with no lucency seen around the screws.  There is an interbody device in the form of L4/5 disc space.  Small subsidence seen into the L5 endplate ventrally.  No lucency seen around the cage.  No fracture or dislocation seen.  XRs of the cervical spine from 03/08/2023 were previously independently reviewed and interpreted, showing no significant degenerative changes. No fracture or dislocation. No evidence of instability on flexion/extension views.    MRI of the cervical spine from 03/01/2023 was previously independently reviewed and interpreted, showing disc herniation at C5/6 that  is mostly in the left paracentral region. There is effacement of the thecal sac at C5/6 on the left side of the canal. The disc herniation is abutting the spinal cord on the left at C5/6. No other stenosis seen. No T2 cord signal change seen.      Patient name: Kim Pham Patient MRN: [id number] Date of visit: 03/30/23

## 2023-04-01 ENCOUNTER — Telehealth: Payer: BC Managed Care – PPO | Admitting: Family

## 2023-04-01 DIAGNOSIS — J069 Acute upper respiratory infection, unspecified: Secondary | ICD-10-CM

## 2023-04-01 MED ORDER — ALBUTEROL SULFATE HFA 108 (90 BASE) MCG/ACT IN AERS: 2.0000 | INHALATION_SPRAY | Freq: Four times a day (QID) | RESPIRATORY_TRACT | 0 refills | Status: AC | PRN

## 2023-04-01 MED ORDER — PREDNISONE 10 MG PO TABS
ORAL_TABLET | ORAL | 0 refills | Status: DC
Start: 2023-04-01 — End: 2023-07-05

## 2023-04-01 MED ORDER — PROMETHAZINE-DM 6.25-15 MG/5ML PO SYRP
5.0000 mL | ORAL_SOLUTION | Freq: Four times a day (QID) | ORAL | 0 refills | Status: DC | PRN
Start: 2023-04-01 — End: 2023-07-05

## 2023-04-01 NOTE — Patient Instructions (Signed)
VISIT SUMMARY:  You came in today with a cough, congestion, and chest tightness that started on Tuesday. Your symptoms began with a dry cough and wheezing, and you also had a low-grade fever that resolved with Tylenol. Your cough has become slightly more productive, and you feel more congestion in your head and ears. Your spouse had similar symptoms before you fell ill. Your COVID-19 test was negative.  YOUR PLAN:  -ACUTE RESPIRATORY INFECTION: An acute respiratory infection is a viral or bacterial infection that affects your respiratory system, causing symptoms like cough, congestion, and chest tightness. We have prescribed an Albuterol inhaler to help with wheezing and chest tightness, Prednisone to reduce inflammation, and Promethazine cough syrup to help with your cough. If your symptoms worsen or do not improve by Monday, please call our office for a follow-up appointment, which may include a chest x-ray.  INSTRUCTIONS:  If your symptoms worsen or do not improve by Monday, please call our office for a follow-up appointment, which may include a chest x-ray.

## 2023-04-01 NOTE — Assessment & Plan Note (Signed)
  Onset Tuesday with dry cough, wheezing, and chest tightness. Progressed to low-grade fever and increased cough productivity. Negative COVID test on 03/29/2023. Likely viral etiology, but pneumonia cannot be ruled out. -Prescribe Albuterol inhaler for wheezing and chest tightness. -Prescribe Prednisone for inflammation. -Prescribe Promethazine cough syrup for cough. -If symptoms worsen or do not improve by Monday, patient to call office for appointment for further evaluation including possible chest x-ray.

## 2023-04-01 NOTE — Progress Notes (Signed)
MyChart Video Visit    Virtual Visit via Video Note    Patient location: Home. Patient and provider in visit Provider location: Office  I discussed the limitations of evaluation and management by telemedicine and the availability of in person appointments. The patient expressed understanding and agreed to proceed.  Visit Date: 04/01/2023  Today's healthcare provider: Lemont Fillers, NP     Subjective:    Patient ID: Kim Pham, female    DOB: [birth year], 42 y.o.   MRN: [id number]  Chief Complaint  Patient presents with   Cough    Patient complains of cough, negative covid test 03/29/2023.   Nasal Congestion    Patient complains of congestion with ear pressure.    Back Pain    Complains of back pain.       The patient, with a history of recent negative COVID-19 test, presents with a cough, congestion, and chest tightness that started on Tuesday 12/10. The symptoms began with a dry cough and wheezing, and a sensation of needing to expectorate but being unable to do so. The cough was non-productive initially. The patient also experienced a low-grade fever of 100.22F last night, which resolved after taking Tylenol. The cough has become slightly more productive, and the patient reports feeling the congestion more in her head and ears. The patient also reports soreness in her lung bases (this is the "back pain" she reported to cma), but denies bringing up any 'nasty stuff.' The patient's spouse had similar symptoms before the patient fell ill.   Past Medical History:  Diagnosis Date   Acute pulmonary embolism without acute cor pulmonale (HCC) 05/17/2022   ADHD (attention deficit hyperactivity disorder)    Adnexal mass 02/24/2021   in epic seen on [place]   Amenorrhea 08/14/2021   Anxiety 08/14/2021   Arthralgia 08/18/2016   Back pain 09/04/2021   Blood in stool 09/08/2021   Cardiac murmur 12/16/2021   Chest discomfort 04/20/2022   Cholecystitis 05/26/2022    Chondromalacia, patella 06/30/2021   Chronic calculous cholecystitis 06/08/2022   Chronic constipation 08/14/2021   Chronic headaches HISTORY OF MIGRAINES 08/14/2021   Chronic migraine without aura, with intractable migraine, so stated, with status migrainosus 07/27/2017   Complication of anesthesia 08/14/2021   PONV   Constipation 06/11/2021   Coronary artery calcification seen on CT scan 12/16/2021   Cyst of left ovary 06/17/2021   Degenerative disc disease, lumbar 09/04/2019   Depression with anxiety 03/26/2016   Diarrhea 07/08/2017   DOE (dyspnea on exertion) 04/20/2022   Effusion, left knee 12/16/2021   Endometriosis    Family history of BRCA gene mutation 06/17/2021   [facility] genetic testing done 06/2021 - negative for breast cancer genes.   Family history of breast cancer 09/09/2015   Formatting of this note might be different from the original.  Mom dx'd age 28  Mat aunt dx'd age 86  Neither relative tested for BRCA  Formatting of this note might be different from the original.  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)    High risk medication use 11/22/2016   history of Chiari malformation    History of Chiari malformation 11/22/2016   history of COVID 02/2021   LOW GRADE FEVER CONGESTION AND HEADACHE ALL SYMPTOMS RESOLVED  Hot flashes 06/11/2021   Hyperlipidemia    Hypertension    Insomnia 08/19/2020   Low blood pressure    pt states history of low blood pressure-fainted 12/2010-instructed to move slowly   Nipple discharge 05/19/2022   Nonepileptic episode (HCC) 12/22/2016   Numbness and tingling 02/25/2016   Obesity (BMI 30.0-34.9) 12/16/2021   On continuous oral anticoagulation 05/17/2022   OSTEOARTHRITIS 08/14/2021   Other epilepsy, intractable, without status  epilepticus (HCC) 11/01/2016   Pain in right knee 11/13/2020   Pain in unspecified joint 08/18/2016   Palpitations 12/15/2021   Pelvic pain in female 08/18/2021   Photosensitivity 11/22/2016   PONV (postoperative nausea and vomiting)    Preventative health care 09/08/2021   Psychogenic nonepileptic seizure 07/27/2017   Pulmonary embolism (HCC)    Raynaud's disease without gangrene 11/22/2016   Rectal bleeding 06/11/2021   RUQ pain 05/07/2022   Sacroiliac joint dysfunction of right side 12/19/2019   Sicca syndrome (HCC) 11/22/2016   SLE (systemic lupus erythematosus) (HCC) 03/26/2016   Status post laparoscopic cholecystectomy 06/08/2022   Subclinical hypothyroidism    Thyroid nodule    Transient alteration of awareness 01/01/2016   Vasomotor symptoms due to menopause 06/17/2021   Voice hoarseness 05/07/2022   Wears glasses for reading 08/14/2021    Past Surgical History:  Procedure Laterality Date   ABDOMINAL HYSTERECTOMY  2007   CHOLECYSTECTOMY N/A 06/08/2022   Procedure: LAPAROSCOPIC CHOLECYSTECTOMY WITH ICG;  Surgeon: Almond Lint, MD;  Location: [facility];  Service: General;  Laterality: N/A;   CYSTOSCOPY  2007   WITH HYSTERECTOMY   DIAGNOSTIC LAPAROSCOPY  2005   LUMBAR LAMINECTOMY/DECOMPRESSION MICRODISCECTOMY Left 11/08/2022   Procedure: L4-5 FAR LEFT LATERAL DISCECTOMY;  Surgeon: London Sheer, MD;  Location: [facility];  Service: Orthopedics;  Laterality: Left;   OOPHORECTOMY Left 08/18/2021   ROTATOR CUFF REPAIR Right 2010   SALPINGOOPHORECTOMY  04/16/2011   Procedure: SALPINGO OOPHERECTOMY;  Surgeon: Lenoard Aden, MD;  Location: [facility];  Service: Gynecology;  Laterality: Right;   skull decompression surgery 2005     @ [facility]    Family History  Problem Relation Age of Onset   Hypertension Mother    CAD Mother    Breast cancer Mother    Migraines Mother    Gout Mother    Heart disease Mother    Crohn's disease Mother    Hypertension Father    CAD Father     CVA Father    Heart disease Father    Colon polyps Father    Liver disease Father    Sjogren's syndrome Sister    Celiac disease Sister    Rheum arthritis Maternal Grandmother    Dementia Maternal Grandmother    Pancreatic cancer Maternal Grandfather    CAD Paternal Grandmother    Macular degeneration Paternal Grandmother    CVA Paternal Grandfather    Autism Son    ADD / ADHD Son    Breast cancer Maternal Aunt    Esophageal cancer Neg Hx    Colon cancer Neg Hx    Stomach cancer Neg Hx     Social History   Socioeconomic History   Marital status: Married    Spouse name: Not on file   Number of children: 1   Years of education: Assoc   Highest education level: Not on file  Occupational History   Occupation: nurse  Tobacco Use   Smoking status: Former    Current packs/day: 0.00    Average packs/day:  1 pack/day for 10.0 years (10.0 ttl pk-yrs)    Types: Cigarettes    Start date: 04/04/1994    Quit date: 04/04/2004    Years since quitting: 19.0    Passive exposure: Past   Smokeless tobacco: Never  Vaping Use   Vaping status: Never Used  Substance and Sexual Activity   Alcohol use: Not Currently   Drug use: Never   Sexual activity: Yes    Birth control/protection: Surgical  Other Topics Concern   Not on file  Social History Narrative   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   Married   1 son- born 2004 has autism   Enjoys exercise, special olympics with son   Drinks 4-5 cups of coffee a day       Right handed   Social Drivers of Health   Financial Resource Strain: Medium Risk (09/14/2022)   Received [facility]   Overall Financial Resource Strain (CARDIA)    Difficulty of Paying Living Expenses: Somewhat hard  Food Insecurity: No Food Insecurity (12/28/2022)   Hunger Vital Sign    Worried About Running Out of Food in the Last Year: Never true    Ran Out of Food in the Last Year: Never true  Transportation Needs: No Transportation Needs  (12/28/2022)   PRAPARE - Administrator, Civil Service (Medical): No    Lack of Transportation (Non-Medical): No  Physical Activity: Insufficiently Active (09/14/2022)   Received from [facility]   Exercise Vital Sign    Days of Exercise per Week: 3 days    Minutes of Exercise per Session: 30 min  Stress: Stress Concern Present (09/14/2022)   Received from [facility] - Occupational Stress Questionnaire    Feeling of Stress : To some extent  Social Connections: Socially Integrated (09/14/2022)   Received from [facility], [facility]   Social Network    How would you rate your social network (family, work, friends)?: Good participation with social networks  Intimate Partner Violence: Not At Risk (12/24/2022)   Humiliation, Afraid, Rape, and Kick questionnaire    Fear of Current or Ex-Partner: No    Emotionally Abused: No    Physically Abused: No    Sexually Abused: No    Outpatient Medications Prior to Visit  Medication Sig Dispense Refill   apixaban (ELIQUIS) 5 MG TABS tablet Take 1 tablet (5 mg total) by mouth 2 (two) times daily. 60 tablet 2   atorvastatin (LIPITOR) 40 MG tablet Take 1 tablet (40 mg total) by mouth daily. 90 tablet 1   cyanocobalamin (VITAMIN B12) 1000 MCG/ML injection Inject 1 ML into muscle once weekly for 4 weeks then once monthly. 6 mL 1   cyclobenzaprine (FLEXERIL) 10 MG tablet Take 1 tablet (10 mg total) by mouth 3 (three) times daily as needed for muscle spasms. 30 tablet 0   famotidine (PEPCID) 20 MG tablet Take 20 mg by mouth at bedtime.     gabapentin (NEURONTIN) 300 MG capsule TAKE 1 CAPSULE BY MOUTH 3 TIMES A DAY 90 capsule 2   HYDROcodone-acetaminophen (NORCO/VICODIN) 5-325 MG tablet Take 1 tablet by mouth every 6 (six) hours as needed for moderate pain (pain score 4-6). 20 tablet 0   levothyroxine (SYNTHROID) 50 MCG tablet TAKE 1 TABLET BY MOUTH DAILY BEFORE  BREAKFAST 90 tablet 0   Melatonin 10 MG TABS Take 20 mg by mouth at bedtime.  Multiple Vitamin (MULTIVITAMIN WITH MINERALS) TABS tablet Take 1 tablet by mouth daily.     ondansetron (ZOFRAN-ODT) 4 MG disintegrating tablet Take 1 tablet (4 mg total) by mouth every 8 (eight) hours as needed. 20 tablet 0   pantoprazole (PROTONIX) 40 MG tablet Take 40 mg by mouth 2 (two) times daily.     sertraline (ZOLOFT) 100 MG tablet Take 1 tablet (100 mg total) by mouth daily. 90 tablet 1   SUMAtriptan (IMITREX) 100 MG tablet Take 1 tablet (100 mg total) by mouth at onset of migraine. May repeat in 2 hours if headache persists or recurs. Do not take more than 2 doses in 24 hours. 10 tablet 2   Syringe/Needle, Disp, (SYRINGE 3CC/21GX1-1/2") 21G X 1-1/2" 3 ML MISC Use as directed 25 each 0   traZODone (DESYREL) 100 MG tablet TAKE TWO TABLETS BY MOUTH EVERY NIGHT AT BEDTIME 180 tablet 1   methylPREDNISolone (MEDROL DOSEPAK) 4 MG TBPK tablet Take as prescribed on the box 21 tablet 0   No facility-administered medications prior to visit.    Allergies  Allergen Reactions   Naltrexone Nausea Only   Rosuvastatin Nausea Only   Tape Itching and Other (See Comments)    Skin itches if tape is left on for awhile   Tramadol Nausea And Vomiting   Effexor [Venlafaxine] Nausea Only and Rash   Norco [Hydrocodone-Acetaminophen] Rash   Voltaren [Diclofenac] Rash    Review of Systems  Respiratory:  Positive for cough.   Musculoskeletal:  Positive for back pain.       Objective:    Physical Exam  There were no vitals taken for this visit. Wt Readings from Last 3 Encounters:  03/22/23 187 lb (84.8 kg)  03/14/23 183 lb (83 kg)  02/25/23 184 lb 12.8 oz (83.8 kg)    Gen: Awake, alert, no acute distress ENT: sounds congested Resp: Breathing is even and non-labored Psych: calm/pleasant demeanor Neuro: Alert and Oriented x 3, + facial symmetry, speech is clear.     Assessment & Plan:   Problem List Items  Addressed This Visit       Unprioritized   URI with cough and congestion - Primary    Onset Tuesday with dry cough, wheezing, and chest tightness. Progressed to low-grade fever and increased cough productivity. Negative COVID test on 03/29/2023. Likely viral etiology, but pneumonia cannot be ruled out. -Prescribe Albuterol inhaler for wheezing and chest tightness. -Prescribe Prednisone for inflammation. -Prescribe Promethazine cough syrup for cough. -If symptoms worsen or do not improve by Monday, patient to call office for appointment for further evaluation including possible chest x-ray.        I have discontinued Myeisha Sangha. Orozco's methylPREDNISolone. I am also having her start on predniSONE, albuterol, and promethazine-dextromethorphan. Additionally, I am having her maintain her SUMAtriptan, Melatonin, pantoprazole, multivitamin with minerals, ondansetron, famotidine, traZODone, apixaban, gabapentin, cyanocobalamin, SYRINGE 3CC/21GX1-1/2", cyclobenzaprine, HYDROcodone-acetaminophen, levothyroxine, sertraline, and atorvastatin.  Meds ordered this encounter  Medications   predniSONE (DELTASONE) 10 MG tablet    Sig: 4 tabs by mouth once daily for 2 days, then 3 tabs daily x 2 days, then 2 tabs daily x 2 days, then 1 tab daily x 2 days    Dispense:  20 tablet    Refill:  0    Supervising Provider:   Danise Edge A [[id number]]   albuterol (VENTOLIN HFA) 108 (90 Base) MCG/ACT inhaler    Sig: Inhale 2 puffs into the lungs every 6 (six) hours as needed for  wheezing or shortness of breath.    Dispense:  8 g    Refill:  0    Supervising Provider:   Danise Edge A [[id number]]   promethazine-dextromethorphan (PROMETHAZINE-DM) 6.25-15 MG/5ML syrup    Sig: Take 5 mLs by mouth 4 (four) times daily as needed for cough.    Dispense:  118 mL    Refill:  0    Supervising Provider:   Danise Edge A [[id number]]    I discussed the assessment and treatment plan with the patient. The patient was provided an  opportunity to ask questions and all were answered. The patient agreed with the plan and demonstrated an understanding of the instructions.   The patient was advised to call back or seek an in-person evaluation if the symptoms worsen or if the condition fails to improve as anticipated.    Lemont Fillers, NP [facility] [phone] (phone) [phone] (fax)  [facility]

## 2023-04-03 ENCOUNTER — Other Ambulatory Visit: Payer: Self-pay | Admitting: Hematology & Oncology

## 2023-04-03 DIAGNOSIS — I2609 Other pulmonary embolism with acute cor pulmonale: Secondary | ICD-10-CM

## 2023-04-04 ENCOUNTER — Other Ambulatory Visit (HOSPITAL_BASED_OUTPATIENT_CLINIC_OR_DEPARTMENT_OTHER): Payer: Self-pay

## 2023-04-04 MED ORDER — APIXABAN 5 MG PO TABS
5.0000 mg | ORAL_TABLET | Freq: Two times a day (BID) | ORAL | 2 refills | Status: DC
  Filled 2023-04-04: qty 60, 30d supply, fill #0
  Filled 2023-05-06: qty 60, 30d supply, fill #1
  Filled 2023-06-07: qty 60, 30d supply, fill #2

## 2023-04-08 ENCOUNTER — Other Ambulatory Visit: Payer: Self-pay | Admitting: Family

## 2023-04-15 ENCOUNTER — Ambulatory Visit (INDEPENDENT_AMBULATORY_CARE_PROVIDER_SITE_OTHER): Payer: BC Managed Care – PPO | Admitting: Medical

## 2023-04-15 VITALS — BP 116/74 | HR 99 | Temp 98.4°F | Resp 18 | Ht 69.0 in | Wt 187.6 lb

## 2023-04-15 DIAGNOSIS — R197 Diarrhea, unspecified: Secondary | ICD-10-CM

## 2023-04-15 DIAGNOSIS — R110 Nausea: Secondary | ICD-10-CM

## 2023-04-15 DIAGNOSIS — R1013 Epigastric pain: Secondary | ICD-10-CM | POA: Diagnosis not present

## 2023-04-15 LAB — COMPREHENSIVE METABOLIC PANEL
AG Ratio: 1.9 (calc) (ref 1.0–2.5)
ALT: 20 U/L (ref 6–29)
AST: 20 U/L (ref 10–30)
Albumin: 4.5 g/dL (ref 3.6–5.1)
Alkaline phosphatase (APISO): 122 U/L (ref 31–125)
BUN: 10 mg/dL (ref 7–25)
CO2: 27 mmol/L (ref 20–32)
Calcium: 9.8 mg/dL (ref 8.6–10.2)
Chloride: 104 mmol/L (ref 98–110)
Creat: 0.76 mg/dL (ref 0.50–0.99)
Globulin: 2.4 g/dL (ref 1.9–3.7)
Glucose, Bld: 85 mg/dL (ref 65–99)
Potassium: 4.1 mmol/L (ref 3.5–5.3)
Sodium: 139 mmol/L (ref 135–146)
Total Bilirubin: 0.3 mg/dL (ref 0.2–1.2)
Total Protein: 6.9 g/dL (ref 6.1–8.1)

## 2023-04-15 LAB — CBC WITH DIFFERENTIAL/PLATELET
Absolute Lymphocytes: 2239 {cells}/uL (ref 850–3900)
Absolute Monocytes: 360 {cells}/uL (ref 200–950)
Basophils Absolute: 58 {cells}/uL (ref 0–200)
Basophils Relative: 0.8 %
Eosinophils Absolute: 101 {cells}/uL (ref 15–500)
Eosinophils Relative: 1.4 %
HCT: 41.3 % (ref 35.0–45.0)
Hemoglobin: 13.1 g/dL (ref 11.7–15.5)
MCH: 27.9 pg (ref 27.0–33.0)
MCHC: 31.7 g/dL — ABNORMAL LOW (ref 32.0–36.0)
MCV: 88.1 fL (ref 80.0–100.0)
MPV: 9.7 fL (ref 7.5–12.5)
Monocytes Relative: 5 %
Neutro Abs: 4442 {cells}/uL (ref 1500–7800)
Neutrophils Relative %: 61.7 %
Platelets: 357 10*3/uL (ref 140–400)
RBC: 4.69 10*6/uL (ref 3.80–5.10)
RDW: 13.1 % (ref 11.0–15.0)
Total Lymphocyte: 31.1 %
WBC: 7.2 10*3/uL (ref 3.8–10.8)

## 2023-04-15 LAB — LIPASE: Lipase: 31 U/L (ref 7–60)

## 2023-04-15 MED ORDER — ONDANSETRON 4 MG PO TBDP: 4.0000 mg | ORAL_TABLET | Freq: Three times a day (TID) | ORAL | 0 refills | Status: AC | PRN

## 2023-04-15 NOTE — Progress Notes (Signed)
Subjective:    Patient ID: Kim Pham, female    DOB: 05/20/1980, 42 y.o.   MRN: [id number]  HPI Discussed the use of AI scribe software for clinical note transcription with the patient, who gave verbal consent to proceed.  History of Present Illness   The patient, with a history of reflux, gallbladder removal, and a previous episode of pancreatitis, presents with severe abdominal pain that began on the morning of December 25th. The pain was associated with a noticeable lump in the abdomen, which was a new symptom for the patient. The pain, initially thought to be related to overindulgence in food and alcohol on December 23rd and 24th, progressively worsened. The patient described the pain as centrally located in the abdomen.  In addition to the abdominal pain, the patient developed loose, yellowish, clay-colored stools with a foul smell. The frequency of bowel movements increased to eight to ten times, and the stool became liquid in consistency. The patient also noticed bright red blood mixed in the stool and on wiping.  The patient also experienced nausea and vomiting, which began the night before the consultation. The patient vomited five to six times within the last 24 hours and was unable to keep anything down except for water. The patient reported a decreased intake of water, from her usual eight to ten glasses a day to only one or two cups in the past day.  The patient's abdominal pain also radiated into the back. Despite attempts to alleviate the pain with a heating pad and simethicone, the pain persisted. The patient reported that laying with a heating pad could dull the pain somewhat.  The patient's diet has been limited to a few crackers and water, with some intake of sugar-free Gatorade or Propel for hydration. The patient is currently on Protonix and famotidine for reflux.        Review of Systems  Constitutional:  Negative for chills, fatigue and fever.  Respiratory:   Negative for cough, choking and wheezing.   Cardiovascular:  Negative for chest pain and palpitations.  Gastrointestinal:  Positive for abdominal pain, blood in stool, diarrhea, nausea and vomiting. Negative for rectal pain.       Scant bright red blood reported with loose stools.  Genitourinary:  Negative for dysuria and frequency.  Musculoskeletal:  Negative for back pain and myalgias.  Skin:  Negative for rash.  Neurological:  Negative for dizziness, seizures and weakness.  Hematological:  Negative for adenopathy. Does not bruise/bleed easily.  Psychiatric/Behavioral:  Negative for behavioral problems and decreased concentration.     Past Medical History:  Diagnosis Date   Acute pulmonary embolism without acute cor pulmonale (HCC) 05/17/2022   ADHD (attention deficit hyperactivity disorder)    Adnexal mass 02/24/2021   in epic seen on [place]   Amenorrhea 08/14/2021   Anxiety 08/14/2021   Arthralgia 08/18/2016   Back pain 09/04/2021   Blood in stool 09/08/2021   Cardiac murmur 12/16/2021   Chest discomfort 04/20/2022   Cholecystitis 05/26/2022   Chondromalacia, patella 06/30/2021   Chronic calculous cholecystitis 06/08/2022   Chronic constipation 08/14/2021   Chronic headaches HISTORY OF MIGRAINES 08/14/2021   Chronic migraine without aura, with intractable migraine, so stated, with status migrainosus 07/27/2017   Complication of anesthesia 08/14/2021   PONV   Constipation 06/11/2021   Coronary artery calcification seen on CT scan 12/16/2021   Cyst of left ovary 06/17/2021   Degenerative disc disease, lumbar 09/04/2019   Depression with anxiety 03/26/2016  Diarrhea 07/08/2017   DOE (dyspnea on exertion) 04/20/2022   Effusion, left knee 12/16/2021   Endometriosis    Family history of BRCA gene mutation 06/17/2021   [facility] genetic testing done 06/2021 - negative for breast cancer genes.   Family history of breast cancer 09/09/2015   Formatting of this note might be different  from the original.  Mom dx'd age 37  Mat aunt dx'd age 62  Neither relative tested for BRCA  Formatting of this note might be different from the original.  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 (HCC)    High risk medication use 11/22/2016   history of Chiari malformation    History of Chiari malformation 11/22/2016   history of COVID 02/2021   LOW GRADE FEVER CONGESTION AND HEADACHE ALL SYMPTOMS RESOLVED   Hot flashes 06/11/2021   Hyperlipidemia    Hypertension    Insomnia 08/19/2020   Low blood pressure    pt states history of low blood pressure-fainted 12/2010-instructed to move slowly   Nipple discharge 05/19/2022   Nonepileptic episode (HCC) 12/22/2016   Numbness and tingling 02/25/2016   Obesity (BMI 30.0-34.9) 12/16/2021   On continuous oral anticoagulation 05/17/2022   OSTEOARTHRITIS 08/14/2021   Other epilepsy, intractable, without status epilepticus (HCC) 11/01/2016   Pain in right knee 11/13/2020   Pain in unspecified joint 08/18/2016   Palpitations 12/15/2021   Pelvic pain in female 08/18/2021   Photosensitivity 11/22/2016   PONV (postoperative nausea and vomiting)    Preventative health care 09/08/2021   Psychogenic nonepileptic seizure 07/27/2017   Pulmonary embolism (HCC)    Raynaud's disease without gangrene 11/22/2016   Rectal bleeding 06/11/2021   RUQ pain 05/07/2022   Sacroiliac joint dysfunction of right side 12/19/2019   Sicca syndrome (HCC) 11/22/2016   SLE (systemic lupus erythematosus) (HCC) 03/26/2016   Status post laparoscopic cholecystectomy 06/08/2022   Subclinical hypothyroidism    Thyroid nodule    Transient alteration of awareness 01/01/2016   Vasomotor symptoms due to menopause 06/17/2021   Voice hoarseness 05/07/2022   Wears glasses for reading  08/14/2021     Social History   Socioeconomic History   Marital status: Married    Spouse name: Not on file   Number of children: 1   Years of education: Assoc   Highest education level: Not on file  Occupational History   Occupation: nurse  Tobacco Use   Smoking status: Former    Current packs/day: 0.00    Average packs/day: 1 pack/day for 10.0 years (10.0 ttl pk-yrs)    Types: Cigarettes    Start date: 04/04/1994    Quit date: 04/04/2004    Years since quitting: 19.0    Passive exposure: Past   Smokeless tobacco: Never  Vaping Use   Vaping status: Never Used  Substance and Sexual Activity   Alcohol use: Not Currently   Drug use: Never   Sexual activity: Yes    Birth control/protection: Surgical  Other Topics Concern   Not on file  Social History Narrative   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   Married   1 son- born 2004 has autism   Enjoys exercise, special olympics with son   Drinks 4-5 cups of coffee a day       Right handed  Social Drivers of Health   Financial Resource Strain: Medium Risk (09/14/2022)   Received from [facility], [facility]   Overall Financial Resource Strain (CARDIA)    Difficulty of Paying Living Expenses: Somewhat hard  Food Insecurity: No Food Insecurity (12/28/2022)   Hunger Vital Sign    Worried About Running Out of Food in the Last Year: Never true    Ran Out of Food in the Last Year: Never true  Transportation Needs: No Transportation Needs (12/28/2022)   PRAPARE - Administrator, Civil Service (Medical): No    Lack of Transportation (Non-Medical): No  Physical Activity: Insufficiently Active (09/14/2022)   Received from [facility], [facility]   Exercise Vital Sign    Days of Exercise per Week: 3 days    Minutes of Exercise per Session: 30 min  Stress: Stress Concern Present (09/14/2022)   Received from [facility], [facility] Occupational [facility] - Occupational Stress Questionnaire     Feeling of Stress : To some extent  Social Connections: Socially Integrated (09/14/2022)   Received from [facility], [facility]   Social Network    How would you rate your social network (family, work, friends)?: Good participation with social networks  Intimate Partner Violence: Not At Risk (12/24/2022)   Humiliation, Afraid, Rape, and Kick questionnaire    Fear of Current or Ex-Partner: No    Emotionally Abused: No    Physically Abused: No    Sexually Abused: No    Past Surgical History:  Procedure Laterality Date   ABDOMINAL HYSTERECTOMY  2007   CHOLECYSTECTOMY N/A 06/08/2022   Procedure: LAPAROSCOPIC CHOLECYSTECTOMY WITH ICG;  Surgeon: Almond Lint, MD;  Location: [facility];  Service: General;  Laterality: N/A;   CYSTOSCOPY  2007   WITH HYSTERECTOMY   DIAGNOSTIC LAPAROSCOPY  2005   LUMBAR LAMINECTOMY/DECOMPRESSION MICRODISCECTOMY Left 11/08/2022   Procedure: L4-5 FAR LEFT LATERAL DISCECTOMY;  Surgeon: London Sheer, MD;  Location: [facility];  Service: Orthopedics;  Laterality: Left;   OOPHORECTOMY Left 08/18/2021   ROTATOR CUFF REPAIR Right 2010   SALPINGOOPHORECTOMY  04/16/2011   Procedure: SALPINGO OOPHERECTOMY;  Surgeon: Lenoard Aden, MD;  Location: [facility];  Service: Gynecology;  Laterality: Right;   skull decompression surgery 2005     @ [facility]    Family History  Problem Relation Age of Onset   Hypertension Mother    CAD Mother    Breast cancer Mother    Migraines Mother    Gout Mother    Heart disease Mother    Crohn's disease Mother    Hypertension Father    CAD Father    CVA Father    Heart disease Father    Colon polyps Father    Liver disease Father    Sjogren's syndrome Sister    Celiac disease Sister    Rheum arthritis Maternal Grandmother    Dementia Maternal Grandmother    Pancreatic cancer Maternal Grandfather    CAD Paternal Grandmother    Macular degeneration Paternal Grandmother    CVA Paternal Grandfather    Autism Son    ADD / ADHD  Son    Breast cancer Maternal Aunt    Esophageal cancer Neg Hx    Colon cancer Neg Hx    Stomach cancer Neg Hx     Allergies  Allergen Reactions   Naltrexone Nausea Only   Rosuvastatin Nausea Only   Tape Itching and Other (See Comments)  Skin itches if tape is left on for awhile   Tramadol Nausea And Vomiting   Effexor [Venlafaxine] Nausea Only and Rash   Norco [Hydrocodone-Acetaminophen] Rash   Voltaren [Diclofenac] Rash    Current Outpatient Medications on File Prior to Visit  Medication Sig Dispense Refill   albuterol (VENTOLIN HFA) 108 (90 Base) MCG/ACT inhaler Inhale 2 puffs into the lungs every 6 (six) hours as needed for wheezing or shortness of breath. 8 g 0   apixaban (ELIQUIS) 5 MG TABS tablet Take 1 tablet (5 mg total) by mouth 2 (two) times daily. 60 tablet 2   atorvastatin (LIPITOR) 40 MG tablet Take 1 tablet (40 mg total) by mouth daily. 90 tablet 1   cyanocobalamin (VITAMIN B12) 1000 MCG/ML injection Inject 1 ML into muscle once weekly for 4 weeks then once monthly. 6 mL 1   cyclobenzaprine (FLEXERIL) 10 MG tablet Take 1 tablet (10 mg total) by mouth 3 (three) times daily as needed for muscle spasms. 30 tablet 0   famotidine (PEPCID) 20 MG tablet Take 20 mg by mouth at bedtime.     gabapentin (NEURONTIN) 300 MG capsule TAKE 1 CAPSULE BY MOUTH 3 TIMES A DAY 90 capsule 2   HYDROcodone-acetaminophen (NORCO/VICODIN) 5-325 MG tablet Take 1 tablet by mouth every 6 (six) hours as needed for moderate pain (pain score 4-6). 20 tablet 0   levothyroxine (SYNTHROID) 50 MCG tablet TAKE 1 TABLET BY MOUTH DAILY BEFORE BREAKFAST 90 tablet 0   Melatonin 10 MG TABS Take 20 mg by mouth at bedtime.     Multiple Vitamin (MULTIVITAMIN WITH MINERALS) TABS tablet Take 1 tablet by mouth daily.     ondansetron (ZOFRAN-ODT) 4 MG disintegrating tablet Take 1 tablet (4 mg total) by mouth every 8 (eight) hours as needed. 20 tablet 0   pantoprazole (PROTONIX) 40 MG tablet Take 40 mg by mouth 2  (two) times daily.     predniSONE (DELTASONE) 10 MG tablet 4 tabs by mouth once daily for 2 days, then 3 tabs daily x 2 days, then 2 tabs daily x 2 days, then 1 tab daily x 2 days 20 tablet 0   promethazine-dextromethorphan (PROMETHAZINE-DM) 6.25-15 MG/5ML syrup Take 5 mLs by mouth 4 (four) times daily as needed for cough. 118 mL 0   sertraline (ZOLOFT) 100 MG tablet Take 1 tablet (100 mg total) by mouth daily. 90 tablet 1   SUMAtriptan (IMITREX) 100 MG tablet Take 1 tablet (100 mg total) by mouth at onset of migraine. May repeat in 2 hours if headache persists or recurs. Do not take more than 2 doses in 24 hours. 10 tablet 2   Syringe/Needle, Disp, (SYRINGE 3CC/21GX1-1/2") 21G X 1-1/2" 3 ML MISC Use as directed 25 each 0   traZODone (DESYREL) 100 MG tablet TAKE TWO TABLETS BY MOUTH EVERY NIGHT AT BEDTIME 180 tablet 1   No current facility-administered medications on file prior to visit.    BP 116/74   Pulse 99   Temp 98.4 F (36.9 C)   Resp 18   Ht 5\' 9"  (1.753 m)   Wt 187 lb 9.6 oz (85.1 kg)   SpO2 100%   BMI 27.70 kg/m        Objective:   Physical Exam  General Mental Status- Alert. General Appearance- Not in acute distress.   Skin General: Color- Normal Color. Moisture- Normal Moisture.  Neck Carotid Arteries- Normal color. Moisture- Normal Moisture. No carotid bruits. No JVD.  Chest and Lung Exam Auscultation: Breath  Sounds:-Normal.  Cardiovascular Auscultation:Rythm- Regular. Murmurs & Other Heart Sounds:Auscultation of the heart reveals- No Murmurs.  Abdomen Inspection:-Inspeection Normal. Palpation/Percussion:Note:No mass. Palpation and Percussion of the abdomen reveal- moderate epigastric and ruq Tender, Non Distended + BS, no rebound or guarding.  Back- no cva tenderness.  Neurologic Cranial Nerve exam:- CN III-XII intact(No nystagmus), symmetric smile. Drift Test:- No drift. Romberg Exam:- Negative.  Heal to Toe Gait exam:-Normal. Finger to Nose:-  Normal/Intact Strength:- 5/5 equal and symmetric strength both upper and lower extremities.       Assessment & Plan:   Patient Instructions  Acute Abdominal Pain. on exam epigastric and ruq area. Severe, localized, central abdominal pain with a palpable mass, onset 04/13/2023 reported but I don't appreciate on exam presently.. Associated with nausea, vomiting, and diarrhea. History of cholecystectomy with sludge, no stones. Prior episode of pancreatitis. -Order CBC, metabolic panel, and lipase stat. -Consider abdominal ultrasound if pain persists or worsens.(or other imaging studies if indicated) -Continue Protonix and double dose of Famotidine. -Consider low dose Tramadol for pain control, pending lab results. If were to rx tramadol and pain exceeds tramadol then advise ED Evaluation.  Acute Diarrhea Multiple episodes of loose, yellowish, foul-smelling stools with some small amount bright red blood. Onset after heavy eating and drinking on 04/11/2023 and 04/12/2023. -Pick up stool kit and collect sample if diarrhea persists until 04/18/2023. -Continue bland diet and hydration with sugar-free Gatorade or Propel. -Refill Zofran for nausea and vomiting control.   Follow-up Await lab results and adjust management accordingly. Re-evaluate patient's condition on 04/18/2023. Ask you update me by my chart how you are doing monday morning.   Esperanza Richters, PA-C

## 2023-04-15 NOTE — Patient Instructions (Signed)
Acute Abdominal Pain. on exam epigastric and ruq area. Severe, localized, central abdominal pain with a palpable mass, onset 04/13/2023 reported but I don't appreciate on exam presently.. Associated with nausea, vomiting, and diarrhea. History of cholecystectomy with sludge, no stones. Prior episode of pancreatitis. -Order CBC, metabolic panel, and lipase stat. -Consider abdominal ultrasound if pain persists or worsens.(or other imaging studies if indicated) -Continue Protonix and double dose of Famotidine. -Consider low dose Tramadol for pain control, pending lab results. If were to rx tramadol and pain exceeds tramadol then advise ED Evaluation.  Acute Diarrhea Multiple episodes of loose, yellowish, foul-smelling stools with some small amount bright red blood. Onset after heavy eating and drinking on 04/11/2023 and 04/12/2023. -Pick up stool kit and collect sample if diarrhea persists until 04/18/2023. -Continue bland diet and hydration with sugar-free Gatorade or Propel. -Refill Zofran for nausea and vomiting control.   Follow-up Await lab results and adjust management accordingly. Re-evaluate patient's condition on 04/18/2023. Ask you update me by my chart how you are doing monday morning.

## 2023-04-21 ENCOUNTER — Ambulatory Visit: Payer: BC Managed Care – PPO | Admitting: Behavioral Health

## 2023-04-21 ENCOUNTER — Encounter: Payer: Self-pay | Admitting: Behavioral Health

## 2023-04-21 DIAGNOSIS — F411 Generalized anxiety disorder: Secondary | ICD-10-CM | POA: Diagnosis not present

## 2023-04-21 DIAGNOSIS — F4321 Adjustment disorder with depressed mood: Secondary | ICD-10-CM
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
## 2023-04-22 ENCOUNTER — Encounter: Payer: Self-pay | Admitting: Orthopedic Surgery

## 2023-04-28 ENCOUNTER — Other Ambulatory Visit (INDEPENDENT_AMBULATORY_CARE_PROVIDER_SITE_OTHER): Payer: Self-pay

## 2023-04-28 ENCOUNTER — Ambulatory Visit (INDEPENDENT_AMBULATORY_CARE_PROVIDER_SITE_OTHER): Payer: BC Managed Care – PPO | Admitting: Orthopedic Surgery

## 2023-04-28 DIAGNOSIS — Z981 Arthrodesis status: Secondary | ICD-10-CM | POA: Diagnosis not present

## 2023-04-28 DIAGNOSIS — M5412 Radiculopathy, cervical region: Secondary | ICD-10-CM

## 2023-04-28 NOTE — Progress Notes (Signed)
 Orthopedic Surgery Office Visit   Assessment: Patient is a 43 y.o. with:  1) low back pain after a fall 2) neck pain that radiates into her left upper extremity, consistent with cervical radiculopathy. Has left sided C5/6 disc herniation   Plan: -Patient's radiating arm pain has persisted in spite of trying conservative treatments over the last two months, so dicussed surgery as a treatment option. After this discussion, patient elected to proceed -For her back, she had acute worsening after a fall. She does not have any midline tenderness and no fracture is seen. I told her I suspect this will get better with time. Will continue to monitor -Treatments tried for her radiculopathy: Tylenol , gabapentin , Medrol  Dosepak, cervical steroid injection -Return to office in 4 weeks, x-rays needed at next visit: none   The patient has cervical radiculopathy, which was initially treated with non-operative measures. The patient's symptoms did not improve with conservative treatments, so operative management was discussed in the form of C5/6 anterior cervical discectomy and fusion as a treatment option. The risks, including but not limited to pseudarthrosis, dysphagia, hematoma, airway compromise, recurrent laryngeal nerve injury, esophageal perforation, durotomy, spinal cord injury, nerve root injury, persistent pain, adjacent segment disease, infection, bleeding, hardware failure, vascular injury, heart attack, death, stroke, fracture, and need for additional procedures were discussed with the patient. The benefit of surgery would be improvement in the patient's radiating arm pain. Explained that the patient may not get full relief of her pain with this surgery, especially any neck pain. The alternatives to surgical management would be continued monitoring, physical therapy, over-the-counter pain medications, repeat injections, traction, gabapentin /lyrica , and activity modification. All the patient's questions  were answered to her satisfaction. After this discussion, the patient expressed understanding and elected to proceed with surgical intervention.      ___________________________________________________________________________     Subjective: Patient has been having pain that starts in her neck that radiates into her left upper extremity. She feels it goes into the lateral aspect of the arm into the radial forearm. It also goes into the radial two digits of the hands. She does not have any pain radiating into her right upper extremity. She feels the pain on a daily basis. She feels the pain with activity and at rest. She has tried tylenol , gabapentin , medrol  dosepak, and a cervical steroid injection. She has not noticed any significant or lasting relief with these treatments.   She had a fall about 10 days ago. After that fall, she noted pain in her low back. She feels it along the belt line. She feels it is worse when she is active and improves with rest. She says she has a weird feeling going into her right leg along the lateral aspect. No pain radiating into the left lower extremity. Pain has not changed over the last couple of days.      Objective:   General: no acute distress, appropriate affect Neurologic: alert, answering questions appropriately, following commands Respiratory: unlabored breathing on room air     MSK (spine):   -Strength exam                                                   Left                  Right  Grip strength  5/5                  5/5 Interosseus                  5/5                  5/5 Wrist extension            5/5                  5/5 Wrist flexion                 5/5                  5/5 Elbow flexion                5/5                  5/5 Deltoid                          5/5                  5/5   EHL                              5/5                  5/5 TA                                 5/5                  5/5 GSC                              5/5                  5/5 Knee extension            5/5                  5/5 Hip flexion                    5/5                  5/5   -Biceps DTR: 2/4 on the left, 2/4 on the right -Triceps DTR: 2/4 on the left, 2/4 on the right -Brachioradialis DTR: 2/4 on the left, 2/4 on the right -Patellar DTR: 2/4 on the left, 2/4 on the right -Achilles DTR: 2/4 on the left, 2/4 on the right  -Sensory exam                           Sensation intact to light touch in C5-T1 nerve distributions of bilateral upper extremities (decreased in C6 distribution on the left)             Sensation intact to light touch in L3-S1 nerve distributions of bilateral lower extremities   Imaging: X-rays of the lumbar spine from 04/28/2023 was independently reviewed and interpreted, showing interbody device at L4/5 that appears in appropriate position. Posterior instrumentation at L4 and L5 with no lucency around the screws. No lucency seen around the screws. No  fracture or dislocation seen.     XRs of the cervical spine from 04/28/2023 were independently reviewed and interpreted, showing no significant degenerative changes. No fracture or dislocation. No evidence of instability on flexion/extension views.    MRI of the cervical spine from 03/01/2023 was previously independently reviewed and interpreted, showing disc herniation at C5/6 that is mostly in the left paracentral region. There is effacement of the thecal sac at C5/6 on the left side of the canal. The disc herniation is abutting the spinal cord on the left at C5/6. No other stenosis seen. No T2 cord signal change seen.      Patient name: Kim Pham Patient MRN: [id number] Date of visit: 04/28/23  Pre-operative Scores  NDI: 50% VAS neck: 7 VAS arm: 7

## 2023-05-02 ENCOUNTER — Ambulatory Visit: Payer: BC Managed Care – PPO | Admitting: Behavioral Health

## 2023-05-02 ENCOUNTER — Encounter: Payer: Self-pay | Admitting: Behavioral Health

## 2023-05-02 DIAGNOSIS — F4321 Adjustment disorder with depressed mood: Secondary | ICD-10-CM

## 2023-05-02 DIAGNOSIS — F411 Generalized anxiety disorder: Secondary | ICD-10-CM | POA: Diagnosis not present
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
## 2023-05-03 ENCOUNTER — Other Ambulatory Visit: Payer: Self-pay | Admitting: Family

## 2023-05-03 ENCOUNTER — Ambulatory Visit
Admission: RE | Admit: 2023-05-03 | Discharge: 2023-05-03 | Disposition: A | Discharge status: Home / Self Care | Payer: BC Managed Care – PPO | Source: Ambulatory Visit | Attending: Family | Admitting: Family

## 2023-05-03 DIAGNOSIS — N644 Mastodynia: Secondary | ICD-10-CM | POA: Diagnosis not present

## 2023-05-03 DIAGNOSIS — N6452 Nipple discharge: Secondary | ICD-10-CM

## 2023-05-03 DIAGNOSIS — N6489 Other specified disorders of breast: Secondary | ICD-10-CM

## 2023-05-09 ENCOUNTER — Other Ambulatory Visit: Payer: Self-pay | Admitting: Orthopedic Surgery

## 2023-05-09 ENCOUNTER — Encounter: Payer: Self-pay | Admitting: Orthopedic Surgery

## 2023-05-09 DIAGNOSIS — M5412 Radiculopathy, cervical region: Secondary | ICD-10-CM

## 2023-05-10 MED ORDER — HYDROCODONE-ACETAMINOPHEN 5-325 MG PO TABS: 1.0000 | ORAL_TABLET | Freq: Four times a day (QID) | ORAL | 0 refills | Status: AC | PRN

## 2023-05-10 NOTE — Addendum Note (Signed)
Addended by: Willia Craze on: 05/10/2023 02:19 PM   Modules accepted: Orders

## 2023-05-18 ENCOUNTER — Ambulatory Visit: Payer: BC Managed Care – PPO | Admitting: Behavioral Health

## 2023-05-20 ENCOUNTER — Ambulatory Visit (HOSPITAL_COMMUNITY): Admit: 2023-05-20 | Payer: BC Managed Care – PPO | Admitting: Orthopedic Surgery

## 2023-05-20 DIAGNOSIS — Z01818 Encounter for other preprocedural examination: Secondary | ICD-10-CM

## 2023-05-20 SURGERY — ANTERIOR CERVICAL DECOMPRESSION/DISCECTOMY FUSION 1 LEVEL
Anesthesia: General

## 2023-06-01 ENCOUNTER — Ambulatory Visit: Payer: BC Managed Care – PPO | Admitting: Behavioral Health

## 2023-06-01 ENCOUNTER — Encounter: Payer: Self-pay | Admitting: Behavioral Health

## 2023-06-01 DIAGNOSIS — F411 Generalized anxiety disorder: Secondary | ICD-10-CM | POA: Diagnosis not present

## 2023-06-01 DIAGNOSIS — F331 Major depressive disorder, recurrent, moderate: Secondary | ICD-10-CM
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
## 2023-06-02 ENCOUNTER — Encounter: Payer: BC Managed Care – PPO | Admitting: Orthopedic Surgery

## 2023-06-14 ENCOUNTER — Inpatient Hospital Stay: Payer: BC Managed Care – PPO | Admitting: Family

## 2023-06-14 ENCOUNTER — Inpatient Hospital Stay: Payer: BC Managed Care – PPO

## 2023-06-19 ENCOUNTER — Encounter: Payer: Self-pay | Admitting: Behavioral Health

## 2023-06-20 ENCOUNTER — Ambulatory Visit: Payer: BC Managed Care – PPO | Admitting: Behavioral Health

## 2023-07-05 ENCOUNTER — Ambulatory Visit (INDEPENDENT_AMBULATORY_CARE_PROVIDER_SITE_OTHER): Admitting: Family

## 2023-07-05 ENCOUNTER — Ambulatory Visit (HOSPITAL_BASED_OUTPATIENT_CLINIC_OR_DEPARTMENT_OTHER)
Admission: RE | Admit: 2023-07-05 | Discharge: 2023-07-05 | Disposition: A | Discharge status: Home / Self Care | Source: Ambulatory Visit | Attending: Family | Admitting: Family

## 2023-07-05 ENCOUNTER — Encounter: Payer: Self-pay | Admitting: Family

## 2023-07-05 VITALS — BP 117/68 | HR 77 | Temp 98.4°F | Resp 16 | Ht 69.0 in | Wt 196.0 lb

## 2023-07-05 DIAGNOSIS — R1084 Generalized abdominal pain: Secondary | ICD-10-CM | POA: Insufficient documentation

## 2023-07-05 DIAGNOSIS — R5383 Other fatigue: Secondary | ICD-10-CM

## 2023-07-05 DIAGNOSIS — Z9049 Acquired absence of other specified parts of digestive tract: Secondary | ICD-10-CM | POA: Diagnosis not present

## 2023-07-05 DIAGNOSIS — K625 Hemorrhage of anus and rectum: Secondary | ICD-10-CM | POA: Diagnosis not present

## 2023-07-05 DIAGNOSIS — R109 Unspecified abdominal pain: Secondary | ICD-10-CM | POA: Diagnosis not present

## 2023-07-05 MED ORDER — IOHEXOL 300 MG/ML  SOLN
100.0000 mL | Freq: Once | INTRAMUSCULAR | Status: AC | PRN
  Administered 2023-07-05: 100 mL via INTRAVENOUS

## 2023-07-05 NOTE — Assessment & Plan Note (Signed)
 Dark maroon stools suggest GI bleeding,but small hemocult specimen was negative today in office. Check CBC.

## 2023-07-05 NOTE — Progress Notes (Signed)
 Subjective:     Patient ID: Kim Pham, female    DOB: 1980-10-10, 43 y.o.   MRN: [id number]  Chief Complaint  Patient presents with   Abdominal Pain    Patient complains of abdominal pain since yesterday, dark stools today.     Abdominal Pain    Discussed the use of AI scribe software for clinical note transcription with the patient, who gave verbal consent to proceed.  History of Present Illness  Kim Pham is a 43 year old female who presents with abdominal pain and dark stools. She has been experiencing abdominal pain and dark stools since eating at [employer] on Sunday. The pain began with significant abdominal distension and has been spreading. On Tuesday, she had an episode of dark maroon-colored stools with a foul odor, which was alarming. This episode was accompanied by faintness and sweating, prompting her to seek medical attention. After the meal, she and her companion both developed nausea, vomiting, and diarrhea. However, her companion did not experience ongoing symptoms. Over the past week, she has felt unusually rundown and pale, attributing it initially to her new job. She noticed paleness under her eyes and a white, cracked tongue. She has had a reduced appetite and has only eaten a small amount of oatmeal on the day of the visit. Last week, she experienced shortness of breath and palpitations. No fever is reported. She recalls a previous episode of pancreatitis over the summer, which she considered might be related to her current symptoms. A colonoscopy performed in 2023 was normal.      Health Maintenance Due  Topic Date Due   Pneumococcal Vaccine 54-76 Years old (1 of 2 - PCV) Never done    Past Medical History:  Diagnosis Date   Acute pulmonary embolism without acute cor pulmonale (HCC) 05/17/2022   ADHD (attention deficit hyperactivity disorder)    Adnexal mass 02/24/2021   in epic seen on [place]   Amenorrhea 08/14/2021   Anxiety 08/14/2021    Arthralgia 08/18/2016   Back pain 09/04/2021   Blood in stool 09/08/2021   Cardiac murmur 12/16/2021   Chest discomfort 04/20/2022   Cholecystitis 05/26/2022   Chondromalacia, patella 06/30/2021   Chronic calculous cholecystitis 06/08/2022   Chronic constipation 08/14/2021   Chronic headaches HISTORY OF MIGRAINES 08/14/2021   Chronic migraine without aura, with intractable migraine, so stated, with status migrainosus 07/27/2017   Complication of anesthesia 08/14/2021   PONV   Constipation 06/11/2021   Coronary artery calcification seen on CT scan 12/16/2021   Cyst of left ovary 06/17/2021   Degenerative disc disease, lumbar 09/04/2019   Depression with anxiety 03/26/2016   Diarrhea 07/08/2017   DOE (dyspnea on exertion) 04/20/2022   Effusion, left knee 12/16/2021   Endometriosis    Family history of BRCA gene mutation 06/17/2021   [facility] genetic testing done 06/2021 - negative for breast cancer genes.   Family history of breast cancer 09/09/2015   Formatting of this note might be different from the original.  Mom dx'd age 73  Mat aunt dx'd age 13  Neither relative tested for BRCA  Formatting of this note might be different from the original.  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 (HCC)    High risk medication use 11/22/2016  history of Chiari malformation    History of Chiari malformation 11/22/2016   history of COVID 02/2021   LOW GRADE FEVER CONGESTION AND HEADACHE ALL SYMPTOMS RESOLVED   Hot flashes 06/11/2021   Hyperlipidemia    Hypertension    Insomnia 08/19/2020   Low blood pressure    pt states history of low blood pressure-fainted 12/2010-instructed to move slowly   Nipple discharge 05/19/2022   Nonepileptic episode (HCC) 12/22/2016   Numbness and tingling 02/25/2016    Obesity (BMI 30.0-34.9) 12/16/2021   On continuous oral anticoagulation 05/17/2022   OSTEOARTHRITIS 08/14/2021   Other epilepsy, intractable, without status epilepticus (HCC) 11/01/2016   Pain in right knee 11/13/2020   Pain in unspecified joint 08/18/2016   Palpitations 12/15/2021   Pelvic pain in female 08/18/2021   Photosensitivity 11/22/2016   PONV (postoperative nausea and vomiting)    Preventative health care 09/08/2021   Psychogenic nonepileptic seizure 07/27/2017   Pulmonary embolism (HCC)    Raynaud's disease without gangrene 11/22/2016   Rectal bleeding 06/11/2021   RUQ pain 05/07/2022   Sacroiliac joint dysfunction of right side 12/19/2019   Sicca syndrome (HCC) 11/22/2016   SLE (systemic lupus erythematosus) (HCC) 03/26/2016   Status post laparoscopic cholecystectomy 06/08/2022   Subclinical hypothyroidism    Thyroid nodule    Transient alteration of awareness 01/01/2016   Vasomotor symptoms due to menopause 06/17/2021   Voice hoarseness 05/07/2022   Wears glasses for reading 08/14/2021    Past Surgical History:  Procedure Laterality Date   ABDOMINAL HYSTERECTOMY  2007   CHOLECYSTECTOMY N/A 06/08/2022   Procedure: LAPAROSCOPIC CHOLECYSTECTOMY WITH ICG;  Surgeon: Almond Lint, MD;  Location: [facility];  Service: General;  Laterality: N/A;   CYSTOSCOPY  2007   WITH HYSTERECTOMY   DIAGNOSTIC LAPAROSCOPY  2005   LUMBAR LAMINECTOMY/DECOMPRESSION MICRODISCECTOMY Left 11/08/2022   Procedure: L4-5 FAR LEFT LATERAL DISCECTOMY;  Surgeon: London Sheer, MD;  Location: [facility];  Service: Orthopedics;  Laterality: Left;   OOPHORECTOMY Left 08/18/2021   ROTATOR CUFF REPAIR Right 2010   SALPINGOOPHORECTOMY  04/16/2011   Procedure: SALPINGO OOPHERECTOMY;  Surgeon: Lenoard Aden, MD;  Location: [facility];  Service: Gynecology;  Laterality: Right;   skull decompression surgery 2005     @ [facility]    Family History  Problem Relation Age of Onset   Hypertension Mother    CAD  Mother    Breast cancer Mother    Migraines Mother    Gout Mother    Heart disease Mother    Crohn's disease Mother    Hypertension Father    CAD Father    CVA Father    Heart disease Father    Colon polyps Father    Liver disease Father    Sjogren's syndrome Sister    Celiac disease Sister    Rheum arthritis Maternal Grandmother    Dementia Maternal Grandmother    Pancreatic cancer Maternal Grandfather    CAD Paternal Grandmother    Macular degeneration Paternal Grandmother    CVA Paternal Grandfather    Autism Son    ADD / ADHD Son    Breast cancer Maternal Aunt    Esophageal cancer Neg Hx    Colon cancer Neg Hx    Stomach cancer Neg Hx     Social History   Socioeconomic History   Marital status: Married    Spouse name: Not on file   Number of children: 1   Years of education: Assoc   Highest  education level: Not on file  Occupational History   Occupation: nurse  Tobacco Use   Smoking status: Former    Current packs/day: 0.00    Average packs/day: 1 pack/day for 10.0 years (10.0 ttl pk-yrs)    Types: Cigarettes    Start date: 04/04/1994    Quit date: 04/04/2004    Years since quitting: 19.2    Passive exposure: Past   Smokeless tobacco: Never  Vaping Use   Vaping status: Never Used  Substance and Sexual Activity   Alcohol use: Not Currently   Drug use: Never   Sexual activity: Yes    Birth control/protection: Surgical  Other Topics Concern   Not on file  Social History Narrative   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, [facility]   Overall Financial Resource Strain (CARDIA)    Difficulty of Paying Living Expenses: Somewhat hard  Food Insecurity: No Food Insecurity (12/28/2022)   Hunger Vital Sign    Worried  About Running Out of Food in the Last Year: Never true    Ran Out of Food in the Last Year: Never true  Transportation Needs: No Transportation Needs (12/28/2022)   PRAPARE - Administrator, Civil Service (Medical): No    Lack of Transportation (Non-Medical): No  Physical Activity: Insufficiently Active (09/14/2022)   [facility], [facility]   Exercise Vital Sign    Days of Exercise per Week: 3 days    Minutes of Exercise per Session: 30 min  Stress: Stress Concern Present (09/14/2022)   Received from [facility], [facility] - Occupational Stress Questionnaire    Feeling of Stress : To some extent  Social Connections: Socially Integrated (09/14/2022)   Received from [facility], [facility]   Social Network    How would you rate your social network (family, work, friends)?: Good participation with social networks  Intimate Partner Violence: Not At Risk (12/24/2022)   Humiliation, Afraid, Rape, and Kick questionnaire    Fear of Current or Ex-Partner: No    Emotionally Abused: No    Physically Abused: No    Sexually Abused: No    Outpatient Medications Prior to Visit  Medication Sig Dispense Refill   apixaban (ELIQUIS) 5 MG TABS tablet Take 1 tablet (5 mg total) by mouth 2 (two) times daily. 60 tablet 2   atorvastatin (LIPITOR) 40 MG tablet Take 1 tablet (40 mg total) by mouth daily. 90 tablet 1   cyclobenzaprine (FLEXERIL) 10 MG tablet Take 1 tablet (10 mg total) by mouth 3 (three) times daily as needed for muscle spasms. 30 tablet 0   gabapentin (NEURONTIN) 300 MG capsule TAKE 1 CAPSULE BY MOUTH 3 TIMES A DAY 90 capsule 2   levothyroxine (SYNTHROID) 50 MCG tablet TAKE 1 TABLET BY MOUTH DAILY BEFORE BREAKFAST 90 tablet 0   Melatonin 10 MG TABS Take 20 mg by mouth at bedtime.     Multiple Vitamin (MULTIVITAMIN WITH MINERALS) TABS tablet Take 1 tablet by mouth daily.     ondansetron (ZOFRAN-ODT) 4 MG  disintegrating tablet Take 1 tablet (4 mg total) by mouth every 8 (eight) hours  as needed. 20 tablet 0   ondansetron (ZOFRAN-ODT) 4 MG disintegrating tablet Take 1 tablet (4 mg total) by mouth every 8 (eight) hours as needed for nausea or vomiting. 20 tablet 0   sertraline (ZOLOFT) 100 MG tablet Take 1 tablet (100 mg total) by mouth daily. 90 tablet 1   SUMAtriptan (IMITREX) 100 MG tablet Take 1 tablet (100 mg total) by mouth at onset of migraine. May repeat in 2 hours if headache persists or recurs. Do not take more than 2 doses in 24 hours. 10 tablet 2   Syringe/Needle, Disp, (SYRINGE 3CC/21GX1-1/2") 21G X 1-1/2" 3 ML MISC Use as directed 25 each 0   traZODone (DESYREL) 100 MG tablet TAKE TWO TABLETS BY MOUTH EVERY NIGHT AT BEDTIME 180 tablet 1   albuterol (VENTOLIN HFA) 108 (90 Base) MCG/ACT inhaler Inhale 2 puffs into the lungs every 6 (six) hours as needed for wheezing or shortness of breath. 8 g 0   cyanocobalamin (VITAMIN B12) 1000 MCG/ML injection Inject 1 ML into muscle once weekly for 4 weeks then once monthly. 6 mL 1   famotidine (PEPCID) 20 MG tablet Take 20 mg by mouth at bedtime.     pantoprazole (PROTONIX) 40 MG tablet Take 40 mg by mouth 2 (two) times daily.     predniSONE (DELTASONE) 10 MG tablet 4 tabs by mouth once daily for 2 days, then 3 tabs daily x 2 days, then 2 tabs daily x 2 days, then 1 tab daily x 2 days 20 tablet 0   promethazine-dextromethorphan (PROMETHAZINE-DM) 6.25-15 MG/5ML syrup Take 5 mLs by mouth 4 (four) times daily as needed for cough. 118 mL 0   No facility-administered medications prior to visit.    Allergies  Allergen Reactions   Naltrexone Nausea Only   Rosuvastatin Nausea Only   Tape Itching and Other (See Comments)    Skin itches if tape is left on for awhile   Tramadol Nausea And Vomiting   Effexor [Venlafaxine] Nausea Only and Rash   Norco [Hydrocodone-Acetaminophen] Rash   Voltaren [Diclofenac] Rash    Review of Systems  Gastrointestinal:   Positive for abdominal pain.       Objective:    Physical Exam Exam conducted with a chaperone present.  Constitutional:      General: She is not in acute distress.    Appearance: Normal appearance. She is well-developed.  HENT:     Head: Normocephalic and atraumatic.     Right Ear: External ear normal.     Left Ear: External ear normal.  Eyes:     General: No scleral icterus. Neck:     Thyroid: No thyromegaly.  Cardiovascular:     Rate and Rhythm: Normal rate and regular rhythm.     Heart sounds: Normal heart sounds. No murmur heard. Pulmonary:     Effort: Pulmonary effort is normal. No respiratory distress.     Breath sounds: Normal breath sounds. No wheezing.  Abdominal:     General: Bowel sounds are decreased.     Tenderness: There is generalized abdominal tenderness (generalized tenderness, worse epigastric, mild guarding).  Genitourinary:    Rectum: Normal. Guaiac result negative. No mass, tenderness, external hemorrhoid or internal hemorrhoid. Normal anal tone.  Musculoskeletal:     Cervical back: Neck supple.  Skin:    General: Skin is warm and dry.  Neurological:     Mental Status: She is alert and oriented to person, place, and time.  Psychiatric:  Mood and Affect: Mood normal.        Behavior: Behavior normal.        Thought Content: Thought content normal.        Judgment: Judgment normal.      BP 117/68 (BP Location: Right Arm, Patient Position: Sitting, Cuff Size: Large)   Pulse 77   Temp 98.4 F (36.9 C) (Oral)   Resp 16   Ht 5\' 9"  (1.753 m)   Wt 196 lb (88.9 kg)   SpO2 97%   BMI 28.94 kg/m  Wt Readings from Last 3 Encounters:  07/05/23 196 lb (88.9 kg)  04/15/23 187 lb 9.6 oz (85.1 kg)  03/22/23 187 lb (84.8 kg)       Assessment & Plan:   Problem List Items Addressed This Visit       Unprioritized   Rectal bleeding - Primary   Dark maroon stools suggest GI bleeding,but small hemocult specimen was negative today in office.  Check CBC.       Relevant Orders   Comp Met (CMET)   CBC w/Diff   Abdominal pain   New. Concern for pancreatitis/diverticulitis, obtain stat CT abd/pelvis.  Labs as ordered.      Relevant Orders   CT ABDOMEN PELVIS W CONTRAST   Lipase   Other Visit Diagnoses       Fatigue, unspecified type       Relevant Orders   TSH       I have discontinued Roda Shutters. Engram's pantoprazole, famotidine, cyanocobalamin, predniSONE, and promethazine-dextromethorphan. I am also having her maintain her SUMAtriptan, Melatonin, multivitamin with minerals, ondansetron, gabapentin, SYRINGE 3CC/21GX1-1/2", cyclobenzaprine, levothyroxine, sertraline, atorvastatin, albuterol, apixaban, traZODone, and ondansetron.  No orders of the defined types were placed in this encounter.

## 2023-07-05 NOTE — Assessment & Plan Note (Signed)
 New. Concern for pancreatitis/diverticulitis, obtain stat CT abd/pelvis.  Labs as ordered.

## 2023-07-05 NOTE — Patient Instructions (Signed)
 YOUR PLAN:  Please complete CT scan and lab work today.

## 2023-07-06 ENCOUNTER — Encounter: Payer: Self-pay | Admitting: Family

## 2023-07-06 LAB — CBC WITH DIFFERENTIAL/PLATELET
Basophils Absolute: 0.1 10*3/uL (ref 0.0–0.1)
Basophils Relative: 1.2 % (ref 0.0–3.0)
Eosinophils Absolute: 0.1 10*3/uL (ref 0.0–0.7)
Eosinophils Relative: 2.4 % (ref 0.0–5.0)
HCT: 40 % (ref 36.0–46.0)
Hemoglobin: 13.1 g/dL (ref 12.0–15.0)
Lymphocytes Relative: 30.3 % (ref 12.0–46.0)
Lymphs Abs: 1.7 10*3/uL (ref 0.7–4.0)
MCHC: 32.8 g/dL (ref 30.0–36.0)
MCV: 90.7 fl (ref 78.0–100.0)
Monocytes Absolute: 0.3 10*3/uL (ref 0.1–1.0)
Monocytes Relative: 6.2 % (ref 3.0–12.0)
Neutro Abs: 3.3 10*3/uL (ref 1.4–7.7)
Neutrophils Relative %: 59.9 % (ref 43.0–77.0)
Platelets: 365 10*3/uL (ref 150.0–400.0)
RBC: 4.41 Mil/uL (ref 3.87–5.11)
RDW: 15 % (ref 11.5–15.5)
WBC: 5.5 10*3/uL (ref 4.0–10.5)

## 2023-07-06 LAB — COMPREHENSIVE METABOLIC PANEL
ALT: 17 U/L (ref 0–35)
AST: 20 U/L (ref 0–37)
Albumin: 4.4 g/dL (ref 3.5–5.2)
Alkaline Phosphatase: 100 U/L (ref 39–117)
BUN: 10 mg/dL (ref 6–23)
CO2: 29 meq/L (ref 19–32)
Calcium: 9.8 mg/dL (ref 8.4–10.5)
Chloride: 105 meq/L (ref 96–112)
Creatinine, Ser: 0.78 mg/dL (ref 0.40–1.20)
GFR: 93.16 mL/min (ref >60.00)
Glucose, Bld: 90 mg/dL (ref 70–99)
Potassium: 4.3 meq/L (ref 3.5–5.1)
Sodium: 141 meq/L (ref 135–145)
Total Bilirubin: 0.4 mg/dL (ref 0.2–1.2)
Total Protein: 6.8 g/dL (ref 6.0–8.3)

## 2023-07-06 LAB — LIPASE: Lipase: 39 U/L (ref 11.0–59.0)

## 2023-07-06 LAB — TSH: TSH: 0.89 u[IU]/mL (ref 0.35–5.50)

## 2023-07-07 ENCOUNTER — Ambulatory Visit (INDEPENDENT_AMBULATORY_CARE_PROVIDER_SITE_OTHER): Payer: BC Managed Care – PPO | Admitting: Behavioral Health

## 2023-07-07 ENCOUNTER — Encounter: Payer: Self-pay | Admitting: Behavioral Health

## 2023-07-07 DIAGNOSIS — F411 Generalized anxiety disorder: Secondary | ICD-10-CM

## 2023-07-07 DIAGNOSIS — F331 Major depressive disorder, recurrent, moderate: Secondary | ICD-10-CM

## 2023-07-07 NOTE — Progress Notes (Signed)
 [facility] Counselor/Therapist Progress Note  Patient ID: Kim Pham, MRN: [id number],    Date: 07/07/2023  Time Spent: 8:01 AM until 8:40 AM, 39 minutes.  This session was held via video teletherapy. The patient consented to the video teletherapy and was located in her home during this session. She is aware it is the responsibility of the patient to secure confidentiality on her end of the session. The provider was in a private home office for the duration of this session.      Treatment Type: Individual Therapy  Reported Symptoms: Anxiety/stress  Mental Status Exam: Appearance:  Well Groomed     Behavior: Appropriate  Motor: Normal  Speech/Language:  Clear and Coherent  Affect: Appropriate  Mood: normal  Thought process: normal  Thought content:   WNL  Sensory/Perceptual disturbances:   WNL  Orientation: oriented to person, place, time/date, situation, day of week, and month of year  Attention: Good  Concentration: Good  Memory: WNL  Fund of knowledge:  Good  Insight:   Good  Judgment:  Good  Impulse Control: Good   Risk Assessment: Danger to Self:  No Self-injurious Behavior: No Danger to Others: No Duty to Warn:no Physical Aggression / Violence:No  Access to Firearms a concern: No  Gang Involvement:No   Subjective: Since our last session the patient has started her new job.  She knows there are some problems to fix but feels that her management and team were committed to fixing those and she feels like they are fixable.  She feels that she is keeping a good pace of solving 1 problem at a time while providing good care for the patient's.  She seems to be enjoying the challenges.  She is trying to make sure she builds in time for self-care including walking around the campus on nice days when she has time, spending time with her husband and her son when he is home.  She has had no desire to drink alcohol and feels that this time is much different and  is committed to being sober.  She is excited about her son's 21st birthday and his graduation coming up in May.  Her mother's house did sell and she does not understand her aunt's decision because she is not communicating anything with him but did give them a chance to go and get things out of the house which were valuable and cherished to them so she and her brother and sister went to get what they wanted.  She is closing the door on the chapter knowing that they can take the memories away from her.  Her grandmother is not in good shape and is now under hospice care so they are trying to go up and see her soon.  She has not spoken to her father but she knows she has made the effort and that she cannot control what he does with that.  We will look at of her history more with her father in future sessions.  For the most part she feels that she is grieving well.  Her anxiety/irritability have been manageable and she is using skills to manage those when they do seem to flare up.   She does contract for safety having no thoughts of hurting herself or anyone else.  Interventions: Cognitive Behavioral Therapy  Diagnosis: Generalized anxiety disorder  Plan: I will meet with the patient every 2 weeks virtually Target date: January 17, 2023 Progress: 30%   I did review the target goals and  a new target date for continuing above-stated goals is July 18, 2023. [name], [facility]                 [name], [facility]               [name], [facility]               [name], [facility]               [name], [facility]               [name], [facility]               [name], [facility]               [name], [facility]               [name], [facility]               [name], [facility]               [name], [facility]

## 2023-07-08 ENCOUNTER — Inpatient Hospital Stay: Payer: Self-pay | Attending: Hematology & Oncology

## 2023-07-08 ENCOUNTER — Other Ambulatory Visit (HOSPITAL_BASED_OUTPATIENT_CLINIC_OR_DEPARTMENT_OTHER): Payer: Self-pay

## 2023-07-08 ENCOUNTER — Inpatient Hospital Stay

## 2023-07-08 ENCOUNTER — Inpatient Hospital Stay (HOSPITAL_BASED_OUTPATIENT_CLINIC_OR_DEPARTMENT_OTHER): Payer: BC Managed Care – PPO | Admitting: Family

## 2023-07-08 ENCOUNTER — Telehealth: Payer: Self-pay

## 2023-07-08 ENCOUNTER — Encounter: Payer: Self-pay | Admitting: Family

## 2023-07-08 VITALS — BP 118/65 | HR 75 | Temp 98.5°F | Resp 17 | Wt 197.8 lb

## 2023-07-08 DIAGNOSIS — D509 Iron deficiency anemia, unspecified: Secondary | ICD-10-CM | POA: Diagnosis not present

## 2023-07-08 DIAGNOSIS — Z86711 Personal history of pulmonary embolism: Secondary | ICD-10-CM | POA: Insufficient documentation

## 2023-07-08 DIAGNOSIS — I2609 Other pulmonary embolism with acute cor pulmonale: Secondary | ICD-10-CM

## 2023-07-08 DIAGNOSIS — R0602 Shortness of breath: Secondary | ICD-10-CM | POA: Diagnosis not present

## 2023-07-08 DIAGNOSIS — M79604 Pain in right leg: Secondary | ICD-10-CM

## 2023-07-08 DIAGNOSIS — I2782 Chronic pulmonary embolism: Secondary | ICD-10-CM

## 2023-07-08 DIAGNOSIS — Z7901 Long term (current) use of anticoagulants: Secondary | ICD-10-CM | POA: Diagnosis not present

## 2023-07-08 LAB — CBC WITH DIFFERENTIAL (CANCER CENTER ONLY)
Abs Immature Granulocytes: 0.03 10*3/uL (ref 0.00–0.07)
Basophils Absolute: 0.1 10*3/uL (ref 0.0–0.1)
Basophils Relative: 1 %
Eosinophils Absolute: 0.1 10*3/uL (ref 0.0–0.5)
Eosinophils Relative: 3 %
HCT: 40.8 % (ref 36.0–46.0)
Hemoglobin: 13.5 g/dL (ref 12.0–15.0)
Immature Granulocytes: 1 %
Lymphocytes Relative: 34 %
Lymphs Abs: 1.9 10*3/uL (ref 0.7–4.0)
MCH: 29.8 pg (ref 26.0–34.0)
MCHC: 33.1 g/dL (ref 30.0–36.0)
MCV: 90.1 fL (ref 80.0–100.0)
Monocytes Absolute: 0.5 10*3/uL (ref 0.1–1.0)
Monocytes Relative: 9 %
Neutro Abs: 2.9 10*3/uL (ref 1.7–7.7)
Neutrophils Relative %: 52 %
Platelet Count: 345 10*3/uL (ref 150–400)
RBC: 4.53 MIL/uL (ref 3.87–5.11)
RDW: 14.6 % (ref 11.5–15.5)
WBC Count: 5.5 10*3/uL (ref 4.0–10.5)
nRBC: 0 % (ref 0.0–0.2)

## 2023-07-08 LAB — CMP (CANCER CENTER ONLY)
ALT: 17 U/L (ref 0–44)
AST: 19 U/L (ref 15–41)
Albumin: 4.7 g/dL (ref 3.5–5.0)
Alkaline Phosphatase: 95 U/L (ref 38–126)
Anion gap: 10 (ref 5–15)
BUN: 9 mg/dL (ref 6–20)
CO2: 28 mmol/L (ref 22–32)
Calcium: 9.5 mg/dL (ref 8.9–10.3)
Chloride: 102 mmol/L (ref 98–111)
Creatinine: 0.82 mg/dL (ref 0.44–1.00)
GFR, Estimated: >60 mL/min (ref >60)
Glucose, Bld: 90 mg/dL (ref 70–99)
Potassium: 4.7 mmol/L (ref 3.5–5.1)
Sodium: 140 mmol/L (ref 135–145)
Total Bilirubin: 0.3 mg/dL (ref 0.0–1.2)
Total Protein: 7 g/dL (ref 6.5–8.1)

## 2023-07-08 LAB — FERRITIN: Ferritin: 10 ng/mL — ABNORMAL LOW (ref 11–307)

## 2023-07-08 LAB — IRON AND IRON BINDING CAPACITY (CC-WL,HP ONLY)
Iron: 45 ug/dL (ref 28–170)
Saturation Ratios: 9 % — ABNORMAL LOW (ref 10.4–31.8)
TIBC: 505 ug/dL — ABNORMAL HIGH (ref 250–450)
UIBC: 460 ug/dL — ABNORMAL HIGH (ref 148–442)

## 2023-07-08 LAB — D-DIMER, QUANTITATIVE: D-Dimer, Quant: <0.27 ug{FEU}/mL (ref 0.00–0.50)

## 2023-07-08 MED ORDER — APIXABAN 5 MG PO TABS
5.0000 mg | ORAL_TABLET | Freq: Two times a day (BID) | ORAL | 3 refills | Status: DC
  Filled 2023-07-08: qty 60, 30d supply, fill #0
  Filled 2023-08-09: qty 60, 30d supply, fill #1
  Filled 2023-09-27: qty 60, 30d supply, fill #2
  Filled 2023-10-25: qty 60, 30d supply, fill #3

## 2023-07-08 NOTE — Telephone Encounter (Signed)
 Pt was seen in office today and requested a refill of Eliquis 5 mg prescribed to take BID. ERX sent.

## 2023-07-08 NOTE — Progress Notes (Signed)
 Hematology and Oncology Follow Up Visit  Kim Pham [id number] Apr 14, 1981 43 y.o. 07/08/2023   Principle Diagnosis:  Acute pulmonary embolism diagnosed on 05/11/2022 - resolved on repeat CTA 06/23/2022 Heterozygous for Prothrombin II gene mutation    Current Therapy:        Eliquis 5 mg PO BID   Interim History:  Kim Pham is here today for follow-up. She has noted some right back flank pain, SOB and right leg pain/calf cramping over the last few weeks.  No swelling or edema noted. No numbness or tingling. Pedal pulses are 2+.  She has been doing a lot of [place] and [place] 4 [place] see her son at college.  She also fell off a rolling cooler in her basement. Thankfully no known serious injuries per patient. No syncope.  She has also had some chest discomfort, palpitations, dizziness, cracking at the corners of her mouth, white tongue and pale under eyes.  She did have food poisoning last week and noted some dark tarry and maroon stools. She still has some abdominal soreness.  No other obvious blood loss noted. No bruising or petechiae.  Hgb is stable at 13.5, MCV 90, platelets 345 and WBC count is 5.5.  She is taking her Eliquis PO BID as prescribed.  No fever, chills, n/v, cough, rash or changes in bowel or bladder habits at this time.   Appetite continues to improve and hydration remains good. Weight is 197 lbs.   ECOG Performance Status: 1 - Symptomatic but completely ambulatory  Medications:  Allergies as of 07/08/2023       Reactions   Naltrexone Nausea Only   Rosuvastatin Nausea Only   Tape Itching, Other (See Comments)   Skin itches if tape is left on for awhile   Tramadol Nausea And Vomiting   Effexor [venlafaxine] Nausea Only, Rash   Norco [hydrocodone-acetaminophen] Rash   Voltaren [diclofenac] Rash        Medication List        Accurate as of July 08, 2023  8:48 AM. If you have any questions, ask your nurse or doctor.          albuterol 108  (90 Base) MCG/ACT inhaler Commonly known as: VENTOLIN HFA Inhale 2 puffs into the lungs every 6 (six) hours as needed for wheezing or shortness of breath.   atorvastatin 40 MG tablet Commonly known as: LIPITOR Take 1 tablet (40 mg total) by mouth daily.   cyclobenzaprine 10 MG tablet Commonly known as: FLEXERIL Take 1 tablet (10 mg total) by mouth 3 (three) times daily as needed for muscle spasms.   Eliquis 5 MG Tabs tablet Generic drug: apixaban Take 1 tablet (5 mg total) by mouth 2 (two) times daily.   gabapentin 300 MG capsule Commonly known as: NEURONTIN TAKE 1 CAPSULE BY MOUTH 3 TIMES A DAY   levothyroxine 50 MCG tablet Commonly known as: SYNTHROID TAKE 1 TABLET BY MOUTH DAILY BEFORE BREAKFAST   Melatonin 10 MG Tabs Take 20 mg by mouth at bedtime.   multivitamin with minerals Tabs tablet Take 1 tablet by mouth daily.   ondansetron 4 MG disintegrating tablet Commonly known as: ZOFRAN-ODT Take 1 tablet (4 mg total) by mouth every 8 (eight) hours as needed.   ondansetron 4 MG disintegrating tablet Commonly known as: ZOFRAN-ODT Take 1 tablet (4 mg total) by mouth every 8 (eight) hours as needed for nausea or vomiting.   sertraline 100 MG tablet Commonly known as: ZOLOFT Take 1 tablet (  100 mg total) by mouth daily.   SUMAtriptan 100 MG tablet Commonly known as: IMITREX Take 1 tablet (100 mg total) by mouth at onset of migraine. May repeat in 2 hours if headache persists or recurs. Do not take more than 2 doses in 24 hours.   SYRINGE 3CC/21GX1-1/2" 21G X 1-1/2" 3 ML Misc Use as directed   traZODone 100 MG tablet Commonly known as: DESYREL TAKE TWO TABLETS BY MOUTH EVERY NIGHT AT BEDTIME        Allergies:  Allergies  Allergen Reactions   Naltrexone Nausea Only   Rosuvastatin Nausea Only   Tape Itching and Other (See Comments)    Skin itches if tape is left on for awhile   Tramadol Nausea And Vomiting   Effexor [Venlafaxine] Nausea Only and Rash   Norco  [Hydrocodone-Acetaminophen] Rash   Voltaren [Diclofenac] Rash    Past Medical History, Surgical history, Social history, and Family History were reviewed and updated.  Review of Systems: All other 10 point review of systems is negative.   Physical Exam:  vitals were not taken for this visit.   Wt Readings from Last 3 Encounters:  07/05/23 196 lb (88.9 kg)  04/15/23 187 lb 9.6 oz (85.1 kg)  03/22/23 187 lb (84.8 kg)    Ocular: Sclerae unicteric, pupils equal, round and reactive to light Ear-nose-throat: Oropharynx clear, dentition fair Lymphatic: No cervical or supraclavicular adenopathy Lungs no rales or rhonchi, good excursion bilaterally Heart regular rate and rhythm, no murmur appreciated Abd soft, nontender, positive bowel sounds MSK no focal spinal tenderness, no joint edema Neuro: non-focal, well-oriented, appropriate affect Breasts: Deferred   Lab Results  Component Value Date   WBC 5.5 07/08/2023   HGB 13.5 07/08/2023   HCT 40.8 07/08/2023   MCV 90.1 07/08/2023   PLT 345 07/08/2023   Lab Results  Component Value Date   FERRITIN 12 03/14/2023   IRON 47 03/14/2023   TIBC 456 (H) 03/14/2023   UIBC 409 03/14/2023   IRONPCTSAT 10 (L) 03/14/2023   Lab Results  Component Value Date   RBC 4.53 07/08/2023   No results found for: "KPAFRELGTCHN", "LAMBDASER", "KAPLAMBRATIO" No results found for: "IGGSERUM", "IGA", "IGMSERUM" No results found for: "TOTALPROTELP", "ALBUMINELP", "A1GS", "A2GS", "BETS", "BETA2SER", "GAMS", "MSPIKE", "SPEI"   Chemistry      Component Value Date/Time   NA 141 07/05/2023 1432   NA 143 01/01/2016 0841   K 4.3 07/05/2023 1432   CL 105 07/05/2023 1432   CO2 29 07/05/2023 1432   BUN 10 07/05/2023 1432   BUN 12 01/01/2016 0841   CREATININE 0.78 07/05/2023 1432   CREATININE 0.76 04/15/2023 1600      Component Value Date/Time   CALCIUM 9.8 07/05/2023 1432   ALKPHOS 100 07/05/2023 1432   AST 20 07/05/2023 1432   AST 14 (L) 03/14/2023  1015   ALT 17 07/05/2023 1432   ALT 18 03/14/2023 1015   BILITOT 0.4 07/05/2023 1432   BILITOT 0.3 03/14/2023 1015       Impression and Plan: Kim Pham is a pleasant 43 yo caucasian female heterozygous for the prothrombin gene mutation with history of PE diagnosed in 04/2022.  We will repeat CTA and RLE [place] to assess for clot recurrence.  Continue same regimen with Eliquis for now.  Iron studies added to rule out iron deficiency based on likely recent GI blood loss and current symptoms.  Follow-up in 4 months.   Eileen Stanford, NP 3/21/20258:48 AM

## 2023-07-11 ENCOUNTER — Telehealth (HOSPITAL_BASED_OUTPATIENT_CLINIC_OR_DEPARTMENT_OTHER): Payer: Self-pay

## 2023-07-11 ENCOUNTER — Other Ambulatory Visit: Payer: Self-pay | Admitting: Family

## 2023-07-11 ENCOUNTER — Encounter: Payer: Self-pay | Admitting: Family

## 2023-07-12 ENCOUNTER — Other Ambulatory Visit: Payer: Self-pay | Admitting: Family

## 2023-07-13 ENCOUNTER — Ambulatory Visit (INDEPENDENT_AMBULATORY_CARE_PROVIDER_SITE_OTHER): Admitting: Family

## 2023-07-13 ENCOUNTER — Telehealth: Payer: Self-pay | Admitting: Pharmacy Technician

## 2023-07-13 VITALS — BP 112/60 | HR 66 | Temp 97.8°F | Resp 16 | Ht 69.0 in | Wt 196.0 lb

## 2023-07-13 DIAGNOSIS — R197 Diarrhea, unspecified: Secondary | ICD-10-CM | POA: Diagnosis not present

## 2023-07-13 DIAGNOSIS — E611 Iron deficiency: Secondary | ICD-10-CM | POA: Diagnosis not present

## 2023-07-13 DIAGNOSIS — R109 Unspecified abdominal pain: Secondary | ICD-10-CM

## 2023-07-13 NOTE — Assessment & Plan Note (Signed)
  Chronic diarrhea with 6-8 loose stools daily for 10 days. Differential includes viral gastroenteritis, bacterial infection, gastritis, duodenitis, and H. pylori infection. Previous CT imaging unremarkable, normal lipase levels. Negative hemoccult tests despite history of dark maroon stools. Occasional blood-tinged stools, nausea, fatigue, and vomiting, but no fever. Stool studies and H. pylori testing planned to identify infections. Treatment will be initiated if a treatable cause is found, potentially avoiding GI referral. If unresolved, GI referral for endoscopy to rule out ulcers or gastritis is planned. - Order stool studies including a GI profile and C. diff PCR. - Order H. pylori breath test. - Refer to gastroenterology for further evaluation and potential endoscopy if symptoms persist. - Advise to go to the ER for fluids if unable to keep down fluids or feeling extra weak.

## 2023-07-13 NOTE — Patient Instructions (Signed)
 VISIT SUMMARY:  Today, we discussed your ongoing gastrointestinal symptoms, including nausea, loose stools, and occasional vomiting, which have persisted for the past ten days. We also reviewed your recent blood pressure reading and your scheduled IV iron therapy for low ferritin levels.  YOUR PLAN:  -CHRONIC DIARRHEA: Chronic diarrhea means having loose stools for an extended period. We will conduct stool studies and an H. pylori breath test to identify any infections. If the cause is found, we will start treatment. If symptoms persist, we will refer you to a gastroenterologist for further evaluation, including a possible endoscopy. Please go to the ER if you cannot keep down fluids or feel extremely weak.  -IRON DEFICIENCY: Iron deficiency means having low levels of iron in your body, which can cause fatigue. You are scheduled for an IV iron infusion, which should help improve your energy levels. This treatment is generally well-tolerated and does not cause constipation like oral supplements.  -HYPERTENSION: Hypertension means having high blood pressure. Your recent reading was higher than usual at 136/96. We will monitor your blood pressure to see if this is a temporary issue or if it requires further intervention.  INSTRUCTIONS:  Please follow up with the stool studies and H. pylori breath test as ordered. Continue with your scheduled IV iron infusion. Monitor your blood pressure regularly and report any persistent high readings or symptoms. We will also refer you to a gastroenterologist for further evaluation.

## 2023-07-13 NOTE — Progress Notes (Signed)
 Subjective:     Patient ID: Kim Pham, female    DOB: [birth year], 43 y.o.   MRN: [id number]  Chief Complaint  Patient presents with   Nausea    Complains of still having nausea   Diarrhea    Still complaining of diarrhea   Fatigue    Complains of feeling "very tired"     Discussed the use of AI scribe software for clinical note transcription with the patient, who gave verbal consent to proceed.  History of Present Illness  Kim Pham is a 43 year old female who presents with persistent gastrointestinal symptoms including nausea, loose stools, and occasional vomiting.  For the past ten days, she has experienced persistent gastrointestinal symptoms, including six to eight loose stools per day, nausea, and occasional vomiting. These symptoms have led to decreased oral intake and significant fatigue, impacting her ability to perform daily activities.  She reports tenderness in the right upper quadrant and epigastric area, along with abdominal distension. No fevers have been noted. Occasionally, she has blood-tinged stools, although the frequency has decreased since the onset of symptoms. A previous CT scan was unremarkable, and lipase levels were normal. She does not have a gallbladder. Her hemoglobin was normal during the last check, but ferritin levels were low, prompting scheduled IV iron therapy. Hematology recently conducted iron studies without repeating her blood count. She recalls having dark maroon stools previously, but a hemocult test was negative at that time. She has not traveled recently and suspects the symptoms might be foodborne, although her husband's symptoms have resolved.  She is scheduled to receive IV iron therapy due to low ferritin levels, which may contribute to her fatigue and weakness.  Her blood pressure was noted to be higher than usual at 136/96, which she found unusual.   Health Maintenance Due  Topic Date Due   Pneumococcal Vaccine 79-76  Years old (1 of 2 - PCV) Never done    Past Medical History:  Diagnosis Date   Acute pulmonary embolism without acute cor pulmonale (HCC) 05/17/2022   ADHD (attention deficit hyperactivity disorder)    Adnexal mass 02/24/2021   in epic seen on [place]   Amenorrhea 08/14/2021   Anxiety 08/14/2021   Arthralgia 08/18/2016   Back pain 09/04/2021   Blood in stool 09/08/2021   Cardiac murmur 12/16/2021   Chest discomfort 04/20/2022   Cholecystitis 05/26/2022   Chondromalacia, patella 06/30/2021   Chronic calculous cholecystitis 06/08/2022   Chronic constipation 08/14/2021   Chronic headaches HISTORY OF MIGRAINES 08/14/2021   Chronic migraine without aura, with intractable migraine, so stated, with status migrainosus 07/27/2017   Complication of anesthesia 08/14/2021   PONV   Constipation 06/11/2021   Coronary artery calcification seen on CT scan 12/16/2021   Cyst of left ovary 06/17/2021   Degenerative disc disease, lumbar 09/04/2019   Depression with anxiety 03/26/2016   Diarrhea 07/08/2017   DOE (dyspnea on exertion) 04/20/2022   Effusion, left knee 12/16/2021   Endometriosis    Family history of BRCA gene mutation 06/17/2021   [facility] genetic testing done 06/2021 - negative for breast cancer genes.   Family history of breast cancer 09/09/2015   Formatting of this note might be different from the original.  Mom dx'd age 74  Mat aunt dx'd age 58  Neither relative tested for BRCA  Formatting of this note might be different from the original.  Mom dx'd age 18  Mat aunt dx'd age 32  Neither relative tested for  BRCA   Fatigue 03/26/2016   Former smoker 04/20/2022   Gastroesophageal reflux disease 08/19/2020   Hair loss 11/22/2016   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 (HCC)    High risk medication use 11/22/2016   history of Chiari malformation    History of Chiari malformation 11/22/2016   history of COVID 02/2021   LOW GRADE FEVER CONGESTION AND HEADACHE ALL SYMPTOMS  RESOLVED   Hot flashes 06/11/2021   Hyperlipidemia    Hypertension    Insomnia 08/19/2020   Low blood pressure    pt states history of low blood pressure-fainted 12/2010-instructed to move slowly   Nipple discharge 05/19/2022   Nonepileptic episode (HCC) 12/22/2016   Numbness and tingling 02/25/2016   Obesity (BMI 30.0-34.9) 12/16/2021   On continuous oral anticoagulation 05/17/2022   OSTEOARTHRITIS 08/14/2021   Other epilepsy, intractable, without status epilepticus (HCC) 11/01/2016   Pain in right knee 11/13/2020   Pain in unspecified joint 08/18/2016   Palpitations 12/15/2021   Pelvic pain in female 08/18/2021   Photosensitivity 11/22/2016   PONV (postoperative nausea and vomiting)    Preventative health care 09/08/2021   Psychogenic nonepileptic seizure 07/27/2017   Pulmonary embolism (HCC)    Raynaud's disease without gangrene 11/22/2016   Rectal bleeding 06/11/2021   RUQ pain 05/07/2022   Sacroiliac joint dysfunction of right side 12/19/2019   Sicca syndrome (HCC) 11/22/2016   SLE (systemic lupus erythematosus) (HCC) 03/26/2016   Status post laparoscopic cholecystectomy 06/08/2022   Subclinical hypothyroidism    Thyroid nodule    Transient alteration of awareness 01/01/2016   Vasomotor symptoms due to menopause 06/17/2021   Voice hoarseness 05/07/2022   Wears glasses for reading 08/14/2021    Past Surgical History:  Procedure Laterality Date   ABDOMINAL HYSTERECTOMY  2007   CHOLECYSTECTOMY N/A 06/08/2022   Procedure: LAPAROSCOPIC CHOLECYSTECTOMY WITH ICG;  Surgeon: Almond Lint, MD;  Location: [facility];  Service: General;  Laterality: N/A;   CYSTOSCOPY  2007   WITH HYSTERECTOMY   DIAGNOSTIC LAPAROSCOPY  2005   LUMBAR LAMINECTOMY/DECOMPRESSION MICRODISCECTOMY Left 11/08/2022   Procedure: L4-5 FAR LEFT LATERAL DISCECTOMY;  Surgeon: London Sheer, MD;  Location: [facility];  Service: Orthopedics;  Laterality: Left;   OOPHORECTOMY Left 08/18/2021   ROTATOR CUFF REPAIR  Right 2010   SALPINGOOPHORECTOMY  04/16/2011   Procedure: SALPINGO OOPHERECTOMY;  Surgeon: Lenoard Aden, MD;  Location: [facility];  Service: Gynecology;  Laterality: Right;   skull decompression surgery 2005     @ [facility]    Family History  Problem Relation Age of Onset   Hypertension Mother    CAD Mother    Breast cancer Mother    Migraines Mother    Gout Mother    Heart disease Mother    Crohn's disease Mother    Hypertension Father    CAD Father    CVA Father    Heart disease Father    Colon polyps Father    Liver disease Father    Sjogren's syndrome Sister    Celiac disease Sister    Rheum arthritis Maternal Grandmother    Dementia Maternal Grandmother    Pancreatic cancer Maternal Grandfather    CAD Paternal Grandmother    Macular degeneration Paternal Grandmother    CVA Paternal Grandfather    Autism Son    ADD / ADHD Son    Breast cancer Maternal Aunt    Esophageal cancer Neg Hx    Colon cancer Neg Hx    Stomach cancer Neg  Hx     Social History   Socioeconomic History   Marital status: Married    Spouse name: Not on file   Number of children: 1   Years of education: Assoc   Highest education level: Not on file  Occupational History   Occupation: nurse  Tobacco Use   Smoking status: Former    Current packs/day: 0.00    Average packs/day: 1 pack/day for 10.0 years (10.0 ttl pk-yrs)    Types: Cigarettes    Start date: 04/04/1994    Quit date: 04/04/2004    Years since quitting: 19.2    Passive exposure: Past   Smokeless tobacco: Never  Vaping Use   Vaping status: Never Used  Substance and Sexual Activity   Alcohol use: Not Currently   Drug use: Never   Sexual activity: Yes    Birth control/protection: Surgical  Other Topics Concern   Not on file  Social History Narrative   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   Married   1 son- born 2004 has autism   Enjoys exercise, special olympics with son   Drinks 4-5 cups of coffee a day       Right handed    Social Drivers of Health   Financial Resource Strain: Medium Risk (09/14/2022)   Received from [facility]   Overall Financial Resource Strain (CARDIA)    Difficulty of Paying Living Expenses: Somewhat hard  Food Insecurity: No Food Insecurity (12/28/2022)   Hunger Vital Sign    Worried About Running Out of Food in the Last Year: Never true    Ran Out of Food in the Last Year: Never true  Transportation Needs: No Transportation Needs (12/28/2022)   PRAPARE - Administrator, Civil Service (Medical): No    Lack of Transportation (Non-Medical): No  Physical Activity: Insufficiently Active (09/14/2022)   Received from [facility], [facility]   Exercise Vital Sign    Days of Exercise per Week: 3 days    Minutes of Exercise per Session: 30 min  Stress: Stress Concern Present (09/14/2022)   Received from [facility] - Occupational Stress Questionnaire    Feeling of Stress : To some extent  Social Connections: Socially Integrated (09/14/2022)   Received from [facility], [facility]   Social Network    How would you rate your social network (family, work, friends)?: Good participation with social networks  Intimate Partner Violence: Not At Risk (12/24/2022)   Humiliation, Afraid, Rape, and Kick questionnaire    Fear of Current or Ex-Partner: No    Emotionally Abused: No    Physically Abused: No    Sexually Abused: No    Outpatient Medications Prior to Visit  Medication Sig Dispense Refill   albuterol (VENTOLIN HFA) 108 (90 Base) MCG/ACT inhaler Inhale 2 puffs into the lungs every 6 (six) hours as needed for wheezing or shortness of breath. 8 g 0   apixaban (ELIQUIS) 5 MG TABS tablet Take 1 tablet (5 mg total) by mouth 2 (two) times daily. 60 tablet 3   atorvastatin (LIPITOR) 40 MG tablet Take 1 tablet (40 mg total) by mouth daily. 90 tablet 1   Cyanocobalamin (VITAMIN B-12 IJ) Inject 1  Application as directed every 30 (thirty) days.     cyclobenzaprine (FLEXERIL) 10 MG tablet Take 1 tablet (10 mg total) by mouth 3 (three) times daily as needed for muscle spasms. 30 tablet 0  gabapentin (NEURONTIN) 300 MG capsule TAKE 1 CAPSULE BY MOUTH 3 TIMES A DAY 90 capsule 2   levothyroxine (SYNTHROID) 50 MCG tablet TAKE 1 TABLET BY MOUTH DAILY BEFORE BREAKFAST 90 tablet 0   Melatonin 10 MG TABS Take 20 mg by mouth at bedtime.     Multiple Vitamin (MULTIVITAMIN WITH MINERALS) TABS tablet Take 1 tablet by mouth daily.     ondansetron (ZOFRAN-ODT) 4 MG disintegrating tablet Take 1 tablet (4 mg total) by mouth every 8 (eight) hours as needed. 20 tablet 0   ondansetron (ZOFRAN-ODT) 4 MG disintegrating tablet Take 1 tablet (4 mg total) by mouth every 8 (eight) hours as needed for nausea or vomiting. 20 tablet 0   sertraline (ZOLOFT) 100 MG tablet Take 1 tablet (100 mg total) by mouth daily. 90 tablet 1   SUMAtriptan (IMITREX) 100 MG tablet Take 1 tablet (100 mg total) by mouth at onset of migraine. May repeat in 2 hours if headache persists or recurs. Do not take more than 2 doses in 24 hours. 10 tablet 2   Syringe/Needle, Disp, (SYRINGE 3CC/21GX1-1/2") 21G X 1-1/2" 3 ML MISC Use as directed 25 each 0   traZODone (DESYREL) 100 MG tablet TAKE TWO TABLETS BY MOUTH EVERY NIGHT AT BEDTIME 180 tablet 1   No facility-administered medications prior to visit.    Allergies  Allergen Reactions   Naltrexone Nausea Only   Rosuvastatin Nausea Only   Tape Itching and Other (See Comments)    Skin itches if tape is left on for awhile   Tramadol Nausea And Vomiting   Effexor [Venlafaxine] Nausea Only and Rash   Norco [Hydrocodone-Acetaminophen] Rash   Voltaren [Diclofenac] Rash    Review of Systems  Gastrointestinal:  Positive for diarrhea.       Objective:    Physical Exam Constitutional:      General: She is not in acute distress.    Appearance: Normal appearance. She is well-developed.   HENT:     Head: Normocephalic and atraumatic.     Right Ear: External ear normal.     Left Ear: External ear normal.  Eyes:     General: No scleral icterus. Neck:     Thyroid: No thyromegaly.  Cardiovascular:     Rate and Rhythm: Normal rate and regular rhythm.     Heart sounds: Normal heart sounds. No murmur heard. Pulmonary:     Effort: Pulmonary effort is normal. No respiratory distress.     Breath sounds: Normal breath sounds. No wheezing.  Abdominal:     General: Bowel sounds are normal.     Palpations: Abdomen is soft.     Tenderness: There is abdominal tenderness in the right upper quadrant and epigastric area. There is no guarding.     Comments: Mild distension  Musculoskeletal:     Cervical back: Neck supple.  Skin:    General: Skin is warm and dry.  Neurological:     Mental Status: She is alert and oriented to person, place, and time.  Psychiatric:        Mood and Affect: Mood normal.        Behavior: Behavior normal.        Thought Content: Thought content normal.        Judgment: Judgment normal.      BP 112/60 (BP Location: Right Arm, Patient Position: Sitting, Cuff Size: Large)   Pulse 66   Temp 97.8 F (36.6 C) (Oral)   Resp 16   Ht 5'  9" (1.753 m)   Wt 196 lb (88.9 kg)   SpO2 98%   BMI 28.94 kg/m  Wt Readings from Last 3 Encounters:  07/13/23 196 lb (88.9 kg)  07/08/23 197 lb 12.8 oz (89.7 kg)  07/05/23 196 lb (88.9 kg)       Assessment & Plan:   Problem List Items Addressed This Visit       Unprioritized   Diarrhea - Primary    Chronic diarrhea with 6-8 loose stools daily for 10 days. Differential includes viral gastroenteritis, bacterial infection, gastritis, duodenitis, and H. pylori infection. Previous CT imaging unremarkable, normal lipase levels. Negative hemoccult tests despite history of dark maroon stools. Occasional blood-tinged stools, nausea, fatigue, and vomiting, but no fever. Stool studies and H. pylori testing planned to  identify infections. Treatment will be initiated if a treatable cause is found, potentially avoiding GI referral. If unresolved, GI referral for endoscopy to rule out ulcers or gastritis is planned. - Order stool studies including a GI profile and C. diff PCR. - Order H. pylori breath test. - Refer to gastroenterology for further evaluation and potential endoscopy if symptoms persist. - Advise to go to the ER for fluids if unable to keep down fluids or feeling extra weak.      Relevant Orders   GI Profile, Stool, PCR   Ambulatory referral to Gastroenterology   Abdominal pain   Relevant Orders   H. pylori breath test   Ambulatory referral to Gastroenterology    I am having Roda Shutters. Steel maintain her SUMAtriptan, Melatonin, multivitamin with minerals, ondansetron, gabapentin, SYRINGE 3CC/21GX1-1/2", cyclobenzaprine, levothyroxine, sertraline, atorvastatin, albuterol, traZODone, ondansetron, Cyanocobalamin (VITAMIN B-12 IJ), and apixaban.  No orders of the defined types were placed in this encounter.

## 2023-07-13 NOTE — Telephone Encounter (Signed)
 Clare Gandy note:  patient will be scheduled as soon as possible.  Auth Submission: NO AUTH NEEDED Site of care: Site of care: [facility] Payer: [employer]BS Medication & CPT/J Code(s) submitted: Feraheme (ferumoxytol) [id number] Route of submission (phone, fax, portal):  Phone #[phone] Fax # Auth type: Buy/Bill PB Units/visits requested: 2 DOSES Reference number: [id number] Approval from: 07/13/23 to 12/13/23

## 2023-07-13 NOTE — Telephone Encounter (Signed)
 Was seen today.

## 2023-07-13 NOTE — Assessment & Plan Note (Signed)
  Low ferritin levels with normal hemoglobin. Scheduled for IV iron infusion. Reports fatigue and anticipates relief with treatment. Concerned about side effects, but infusion is generally well-tolerated and does not cause constipation like oral supplements. - Proceed with scheduled IV iron infusion.

## 2023-07-13 NOTE — Progress Notes (Signed)
 Established Patient Office Visit  Subjective   Patient ID: Kim Pham, female    DOB: 1980/09/27  Age: 43 y.o. MRN: [id number]  Chief Complaint  Patient presents with   Nausea    Complains of still having nausea   Diarrhea    Still complaining of diarrhea   Fatigue    Complains of feeling "very tired"    Pleasant 43 yo female patient returns to office for c/o persistent N/V/D since 07/03/2023. She was seen in the office last week for the same. CT abd/pelvis was completed. Results unremarkable. She reports continuous nausea the responds minimally to ondansetron. She is vomiting 1-2 times every other day since onset. Reports diarrhea with 6-8 episodes daily. States stools range from soft consistency to liquid with occasional blood-tinged particles. Patient also reports epigastric/RUQ abdominal pain worse with palpation. Describes pain as an ache or cramping sensation.   Diarrhea    Review of Systems  Constitutional: Negative.   Respiratory: Negative.    Cardiovascular: Negative.   Gastrointestinal:  Positive for diarrhea.       See HPI  Genitourinary: Negative.   Skin: Negative.       Objective:     BP 112/60 (BP Location: Right Arm, Patient Position: Sitting, Cuff Size: Large)   Pulse 66   Temp 97.8 F (36.6 C) (Oral)   Resp 16   Ht 5\' 9"  (1.753 m)   Wt 196 lb (88.9 kg)   SpO2 98%   BMI 28.94 kg/m    Physical Exam Vitals reviewed.  Constitutional:      General: She is not in acute distress.    Appearance: Normal appearance. She is not ill-appearing or toxic-appearing.  HENT:     Head: Normocephalic and atraumatic.     Mouth/Throat:     Mouth: Mucous membranes are moist.     Pharynx: Oropharynx is clear.  Eyes:     Conjunctiva/sclera: Conjunctivae normal.     Pupils: Pupils are equal, round, and reactive to light.  Cardiovascular:     Rate and Rhythm: Normal rate and regular rhythm.     Pulses: Normal pulses.     Heart sounds: Normal heart sounds.   Pulmonary:     Effort: Pulmonary effort is normal.     Breath sounds: Normal breath sounds.  Abdominal:     General: There is distension.     Tenderness: There is abdominal tenderness.     Comments: Abdomen slightly distended with tenderness in epigastric/RUQ area  Skin:    General: Skin is warm and dry.     Capillary Refill: Capillary refill takes less than 2 seconds.  Neurological:     Mental Status: She is alert and oriented to person, place, and time.  Psychiatric:        Mood and Affect: Mood normal.        Behavior: Behavior normal.         Lab Results  Component Value Date   WBC 5.5 07/08/2023   HGB 13.5 07/08/2023   HCT 40.8 07/08/2023   MCV 90.1 07/08/2023   PLT 345 07/08/2023        Assessment & Plan:   Problem List Items Addressed This Visit       Other   Diarrhea - Primary    Chronic diarrhea with 6-8 loose stools daily for 10 days. Differential includes viral gastroenteritis, bacterial infection, gastritis, duodenitis, and H. pylori infection. Previous CT imaging unremarkable, normal lipase levels. Negative hemoccult tests despite history  of dark maroon stools. Occasional blood-tinged stools, nausea, fatigue, and vomiting, but no fever. Stool studies and H. pylori testing planned to identify infections. Treatment will be initiated if a treatable cause is found, potentially avoiding GI referral. If unresolved, GI referral for endoscopy to rule out ulcers or gastritis is planned. - Order stool studies including a GI profile and C. diff PCR. - Order H. pylori breath test. - Refer to gastroenterology for further evaluation and potential endoscopy if symptoms persist. - Advise to go to the ER for fluids if unable to keep down fluids or feeling extra weak.      Relevant Orders   GI Profile, Stool, PCR   Ambulatory referral to Gastroenterology   Abdominal pain   Relevant Orders   H. pylori breath test   Ambulatory referral to Gastroenterology   Iron  deficiency    Low ferritin levels with normal hemoglobin. Scheduled for IV iron infusion. Reports fatigue and anticipates relief with treatment. Concerned about side effects, but infusion is generally well-tolerated and does not cause constipation like oral supplements. - Proceed with scheduled IV iron infusion.          Cristopher Peru, RN

## 2023-07-14 ENCOUNTER — Encounter: Payer: Self-pay | Admitting: Family

## 2023-07-14 ENCOUNTER — Ambulatory Visit (INDEPENDENT_AMBULATORY_CARE_PROVIDER_SITE_OTHER)

## 2023-07-14 ENCOUNTER — Other Ambulatory Visit

## 2023-07-14 VITALS — BP 113/73 | HR 77 | Temp 98.7°F | Resp 12 | Ht 69.0 in | Wt 196.4 lb

## 2023-07-14 DIAGNOSIS — R197 Diarrhea, unspecified: Secondary | ICD-10-CM | POA: Diagnosis not present

## 2023-07-14 DIAGNOSIS — D509 Iron deficiency anemia, unspecified: Secondary | ICD-10-CM | POA: Diagnosis not present

## 2023-07-14 DIAGNOSIS — R109 Unspecified abdominal pain: Secondary | ICD-10-CM | POA: Diagnosis not present

## 2023-07-14 LAB — H. PYLORI BREATH TEST: H. pylori Breath Test: NOT DETECTED

## 2023-07-14 MED ORDER — FERUMOXYTOL INJECTION 510 MG/17 ML
510.0000 mg | Freq: Once | INTRAVENOUS | Status: AC
  Administered 2023-07-14: 510 mg via INTRAVENOUS
  Filled 2023-07-14: qty 17

## 2023-07-14 NOTE — Progress Notes (Signed)
 Diagnosis: Iron Deficiency Anemia  Provider:  Chilton Greathouse MD  Procedure: IV Infusion  IV Type: Peripheral, IV Location: L Antecubital  Feraheme (Ferumoxytol), Dose: 510 mg  Infusion Start Time: 0946  Infusion Stop Time: 1006  Post Infusion IV Care: Observation period completed and Peripheral IV Discontinued  Discharge: Condition: Stable, Destination: Home . AVS Declined  Performed by:  Wyvonne Lenz, RN

## 2023-07-14 NOTE — Addendum Note (Signed)
 Addended by: Harrison Mons on: 07/14/2023 09:28 AM   Modules accepted: Orders

## 2023-07-15 ENCOUNTER — Telehealth (HOSPITAL_BASED_OUTPATIENT_CLINIC_OR_DEPARTMENT_OTHER): Payer: Self-pay

## 2023-07-15 ENCOUNTER — Ambulatory Visit (HOSPITAL_BASED_OUTPATIENT_CLINIC_OR_DEPARTMENT_OTHER): Admission: RE | Admit: 2023-07-15 | Source: Ambulatory Visit

## 2023-07-15 ENCOUNTER — Encounter: Payer: Self-pay | Admitting: Family

## 2023-07-15 LAB — GI PROFILE, STOOL, PCR

## 2023-07-15 NOTE — Telephone Encounter (Signed)
 Kim Pham has changed their iron policy for approvals.  Depending on the plan, Kim Pham no longer needs authorization or step therapy.  I will call the insurance to verify each patients plan and if an Kim Pham is required.   If the plan requires auth or step therapy I will let the provider know more than likely it will be denied if they have not tried the preferred med. (Kim Pham)

## 2023-07-19 ENCOUNTER — Encounter (HOSPITAL_BASED_OUTPATIENT_CLINIC_OR_DEPARTMENT_OTHER): Payer: Self-pay

## 2023-07-19 ENCOUNTER — Emergency Department (HOSPITAL_BASED_OUTPATIENT_CLINIC_OR_DEPARTMENT_OTHER)

## 2023-07-19 ENCOUNTER — Emergency Department (HOSPITAL_BASED_OUTPATIENT_CLINIC_OR_DEPARTMENT_OTHER)
Admission: EM | Admit: 2023-07-19 | Discharge: 2023-07-19 | Disposition: A | Discharge status: Home / Self Care | Attending: Emergency Medicine | Admitting: Emergency Medicine

## 2023-07-19 ENCOUNTER — Other Ambulatory Visit: Payer: Self-pay

## 2023-07-19 DIAGNOSIS — Z7901 Long term (current) use of anticoagulants: Secondary | ICD-10-CM | POA: Insufficient documentation

## 2023-07-19 DIAGNOSIS — R1031 Right lower quadrant pain: Secondary | ICD-10-CM | POA: Insufficient documentation

## 2023-07-19 DIAGNOSIS — K429 Umbilical hernia without obstruction or gangrene: Secondary | ICD-10-CM | POA: Diagnosis not present

## 2023-07-19 LAB — URINALYSIS, ROUTINE W REFLEX MICROSCOPIC
Bilirubin Urine: NEGATIVE
Glucose, UA: NEGATIVE mg/dL
Hgb urine dipstick: NEGATIVE
Ketones, ur: NEGATIVE mg/dL
Nitrite: NEGATIVE
Protein, ur: NEGATIVE mg/dL
Specific Gravity, Urine: 1.01 (ref 1.005–1.030)
pH: 6 (ref 5.0–8.0)

## 2023-07-19 LAB — RESP PANEL BY RT-PCR (RSV, FLU A&B, COVID)  RVPGX2
Influenza A by PCR: NEGATIVE
Influenza B by PCR: NEGATIVE
Resp Syncytial Virus by PCR: NEGATIVE
SARS Coronavirus 2 by RT PCR: NEGATIVE

## 2023-07-19 LAB — LIPASE, BLOOD: Lipase: 30 U/L (ref 11–51)

## 2023-07-19 LAB — COMPREHENSIVE METABOLIC PANEL WITH GFR
ALT: 30 U/L (ref 0–44)
AST: 33 U/L (ref 15–41)
Albumin: 4 g/dL (ref 3.5–5.0)
Alkaline Phosphatase: 99 U/L (ref 38–126)
Anion gap: 10 (ref 5–15)
BUN: 10 mg/dL (ref 6–20)
CO2: 23 mmol/L (ref 22–32)
Calcium: 9.7 mg/dL (ref 8.9–10.3)
Chloride: 102 mmol/L (ref 98–111)
Creatinine, Ser: 0.73 mg/dL (ref 0.44–1.00)
GFR, Estimated: >60 mL/min (ref >60)
Glucose, Bld: 99 mg/dL (ref 70–99)
Potassium: 4.6 mmol/L (ref 3.5–5.1)
Sodium: 135 mmol/L (ref 135–145)
Total Bilirubin: 0.6 mg/dL (ref 0.0–1.2)
Total Protein: 7 g/dL (ref 6.5–8.1)

## 2023-07-19 LAB — URINALYSIS, MICROSCOPIC (REFLEX)

## 2023-07-19 LAB — CBC WITH DIFFERENTIAL/PLATELET
Abs Immature Granulocytes: 0.03 10*3/uL (ref 0.00–0.07)
Basophils Absolute: 0.1 10*3/uL (ref 0.0–0.1)
Basophils Relative: 1 %
Eosinophils Absolute: 0.1 10*3/uL (ref 0.0–0.5)
Eosinophils Relative: 1 %
HCT: 41.1 % (ref 36.0–46.0)
Hemoglobin: 13.6 g/dL (ref 12.0–15.0)
Immature Granulocytes: 0 %
Lymphocytes Relative: 21 %
Lymphs Abs: 1.5 10*3/uL (ref 0.7–4.0)
MCH: 29.7 pg (ref 26.0–34.0)
MCHC: 33.1 g/dL (ref 30.0–36.0)
MCV: 89.7 fL (ref 80.0–100.0)
Monocytes Absolute: 0.5 10*3/uL (ref 0.1–1.0)
Monocytes Relative: 6 %
Neutro Abs: 5.2 10*3/uL (ref 1.7–7.7)
Neutrophils Relative %: 71 %
Platelets: 322 10*3/uL (ref 150–400)
RBC: 4.58 MIL/uL (ref 3.87–5.11)
RDW: 14.6 % (ref 11.5–15.5)
WBC: 7.4 10*3/uL (ref 4.0–10.5)
nRBC: 0 % (ref 0.0–0.2)

## 2023-07-19 LAB — MAGNESIUM: Magnesium: 2 mg/dL (ref 1.7–2.4)

## 2023-07-19 MED ORDER — MORPHINE SULFATE (PF) 4 MG/ML IV SOLN
4.0000 mg | Freq: Once | INTRAVENOUS | Status: AC
  Administered 2023-07-19: 4 mg via INTRAVENOUS
  Filled 2023-07-19: qty 1

## 2023-07-19 MED ORDER — ONDANSETRON HCL 4 MG/2ML IJ SOLN
4.0000 mg | Freq: Once | INTRAMUSCULAR | Status: AC
  Administered 2023-07-19: 4 mg via INTRAVENOUS
  Filled 2023-07-19: qty 2

## 2023-07-19 MED ORDER — IOHEXOL 300 MG/ML  SOLN
100.0000 mL | Freq: Once | INTRAMUSCULAR | Status: AC | PRN
  Administered 2023-07-19: 100 mL via INTRAVENOUS

## 2023-07-19 MED ORDER — SODIUM CHLORIDE 0.9 % IV BOLUS
1000.0000 mL | Freq: Once | INTRAVENOUS | Status: AC
  Administered 2023-07-19: 1000 mL via INTRAVENOUS

## 2023-07-19 MED ORDER — OXYCODONE HCL 5 MG PO TABS
5.0000 mg | ORAL_TABLET | Freq: Once | ORAL | Status: AC
  Administered 2023-07-19: 5 mg via ORAL
  Filled 2023-07-19: qty 1

## 2023-07-19 MED ORDER — KETOROLAC TROMETHAMINE 15 MG/ML IJ SOLN
15.0000 mg | Freq: Once | INTRAMUSCULAR | Status: DC
  Filled 2023-07-19: qty 1

## 2023-07-19 MED ORDER — ONDANSETRON HCL 4 MG PO TABS: 4.0000 mg | ORAL_TABLET | Freq: Four times a day (QID) | ORAL | 0 refills | Status: DC

## 2023-07-19 NOTE — Discharge Instructions (Addendum)
 Please follow-up with your primary care provider in regards to symptoms and ER visit. You have been seen in the Emergency Department (ED) for abdominal pain.  Your labs and imaging did not identify a clear cause of your symptoms but was generally reassuring. Please follow up as instructed above regarding today's emergent visit and the symptoms that are bothering you. You may take Tylenol every 6 hours as needed for pain.  Please use the Zofran I prescribed for you if you become nauseous. Return to the ED if your abdominal pain worsens or fails to improve, you develop bloody vomiting, bloody diarrhea, you are unable to tolerate fluids due to vomiting, fever greater than 101, or other symptoms that concern you.

## 2023-07-19 NOTE — ED Provider Notes (Signed)
 [facility] Provider Note   CSN: [id number] Arrival date & time: 07/19/23  4098     History  Chief Complaint  Patient presents with   Abdominal Pain    Kim Pham is a 43 y.o. female status post hysterectomy and cholecystectomy with history of sicca, SLE, PNES, PE on Eliquis presented for right lower quadrant pain that began today.  Patient states that over the past week she has had nausea vomiting diarrhea and has tried to go over norovirus however started experiencing severe right lower quadrant pain.  Patient states that she is unsure if the pain is making her nauseous or if her appendix is infected as she still has her appendix.  Patient denies any dysuria or hematuria.  Patient denies chest pain shortness of breath or fevers.  Home Medications Prior to Admission medications   Medication Sig Start Date End Date Taking? Authorizing Provider  ondansetron (ZOFRAN) 4 MG tablet Take 1 tablet (4 mg total) by mouth every 6 (six) hours. 07/19/23  Yes Mattthew Ziomek, Beverly Gust, PA-C  albuterol (VENTOLIN HFA) 108 (90 Base) MCG/ACT inhaler Inhale 2 puffs into the lungs every 6 (six) hours as needed for wheezing or shortness of breath. 04/01/23   Sandford Craze, NP  apixaban (ELIQUIS) 5 MG TABS tablet Take 1 tablet (5 mg total) by mouth 2 (two) times daily. 07/08/23   Erenest Blank, NP  atorvastatin (LIPITOR) 40 MG tablet Take 1 tablet (40 mg total) by mouth daily. 03/24/23   Sandford Craze, NP  Cyanocobalamin (VITAMIN B-12 IJ) Inject 1 Application as directed every 30 (thirty) days.    [provider]  cyclobenzaprine (FLEXERIL) 10 MG tablet Take 1 tablet (10 mg total) by mouth 3 (three) times daily as needed for muscle spasms. 02/25/23   Seabron Spates R, DO  gabapentin (NEURONTIN) 300 MG capsule TAKE 1 CAPSULE BY MOUTH 3 TIMES A DAY 02/16/23   London Sheer, MD  levothyroxine (SYNTHROID) 50 MCG tablet TAKE 1 TABLET BY MOUTH DAILY  BEFORE BREAKFAST 03/05/23   Sandford Craze, NP  Melatonin 10 MG TABS Take 20 mg by mouth at bedtime.    [provider]  Multiple Vitamin (MULTIVITAMIN WITH MINERALS) TABS tablet Take 1 tablet by mouth daily.    [provider]  ondansetron (ZOFRAN-ODT) 4 MG disintegrating tablet Take 1 tablet (4 mg total) by mouth every 8 (eight) hours as needed. 08/08/22   Mardene Sayer, MD  ondansetron (ZOFRAN-ODT) 4 MG disintegrating tablet Take 1 tablet (4 mg total) by mouth every 8 (eight) hours as needed for nausea or vomiting. 04/15/23   Saguier, Ramon Dredge, PA-C  sertraline (ZOLOFT) 100 MG tablet Take 1 tablet (100 mg total) by mouth daily. 03/22/23   Sandford Craze, NP  SUMAtriptan (IMITREX) 100 MG tablet Take 1 tablet (100 mg total) by mouth at onset of migraine. May repeat in 2 hours if headache persists or recurs. Do not take more than 2 doses in 24 hours. 06/29/17   Anson Fret, MD  Syringe/Needle, Disp, (SYRINGE 3CC/21GX1-1/2") 21G X 1-1/2" 3 ML MISC Use as directed 02/16/23   Sandford Craze, NP  traZODone (DESYREL) 100 MG tablet TAKE TWO TABLETS BY MOUTH EVERY NIGHT AT BEDTIME 04/08/23   Sandford Craze, NP      Allergies    Naltrexone, Rosuvastatin, Tape, Tramadol, Effexor [venlafaxine], Norco [hydrocodone-acetaminophen], and Voltaren [diclofenac]    Review of Systems   Review of Systems  Gastrointestinal:  Positive for  abdominal pain.    Physical Exam Updated Vital Signs BP 114/78 (BP Location: Right Arm)   Pulse 65   Temp 97.8 F (36.6 C) (Oral)   Resp 16   Ht 5\' 9"  (1.753 m)   Wt 88.5 kg   SpO2 99%   BMI 28.80 kg/m  Physical Exam Vitals reviewed.  Constitutional:      General: She is not in acute distress. HENT:     Head: Normocephalic and atraumatic.  Eyes:     Extraocular Movements: Extraocular movements intact.     Conjunctiva/sclera: Conjunctivae normal.     Pupils: Pupils are equal, round, and reactive to light.   Cardiovascular:     Rate and Rhythm: Normal rate and regular rhythm.     Pulses: Normal pulses.     Heart sounds: Normal heart sounds.     Comments: 2+ bilateral radial/dorsalis pedis pulses with regular rate Pulmonary:     Effort: Pulmonary effort is normal. No respiratory distress.     Breath sounds: Normal breath sounds.  Abdominal:     Palpations: Abdomen is soft.     Tenderness: There is abdominal tenderness. There is rebound (rlq). There is no guarding. Negative signs include Murphy's sign, Rovsing's sign, McBurney's sign and psoas sign.  Musculoskeletal:        General: Normal range of motion.     Cervical back: Normal range of motion and neck supple.     Comments: 5 out of 5 bilateral grip/leg extension strength  Skin:    General: Skin is warm and dry.     Capillary Refill: Capillary refill takes less than 2 seconds.  Neurological:     General: No focal deficit present.     Mental Status: She is alert and oriented to person, place, and time.     Comments: Sensation intact in all 4 limbs  Psychiatric:        Mood and Affect: Mood normal.     ED Results / Procedures / Treatments   Labs (all labs ordered are listed, but only abnormal results are displayed) Labs Reviewed  URINALYSIS, ROUTINE W REFLEX MICROSCOPIC - Abnormal; Notable for the following components:      Result Value   Leukocytes,Ua SMALL (*)    All other components within normal limits  URINALYSIS, MICROSCOPIC (REFLEX) - Abnormal; Notable for the following components:   Bacteria, UA FEW (*)    All other components within normal limits  RESP PANEL BY RT-PCR (RSV, FLU A&B, COVID)  RVPGX2  CBC WITH DIFFERENTIAL/PLATELET  COMPREHENSIVE METABOLIC PANEL WITH GFR  LIPASE, BLOOD  MAGNESIUM    EKG None  Radiology CT ABDOMEN PELVIS W CONTRAST Result Date: 07/19/2023 CLINICAL DATA:  Right lower quadrant abdominal pain. Nausea vomiting. EXAM: CT ABDOMEN AND PELVIS WITH CONTRAST TECHNIQUE: Multidetector CT  imaging of the abdomen and pelvis was performed using the standard protocol following bolus administration of intravenous contrast. RADIATION DOSE REDUCTION: This exam was performed according to the departmental dose-optimization program which includes automated exposure control, adjustment of the mA and/or kV according to patient size and/or use of iterative reconstruction technique. CONTRAST:  OMNIPAQUE IOHEXOL 300 MG/ML  SOLN COMPARISON:  07/05/2023 FINDINGS: Lower chest: No acute findings. Hepatobiliary: No suspicious focal abnormality within the liver parenchyma. Cholecystectomy. No intrahepatic or extrahepatic biliary dilation. Pancreas: No focal mass lesion. No dilatation of the main duct. No intraparenchymal cyst. No peripancreatic edema. Spleen: No splenomegaly. No suspicious focal mass lesion. Adrenals/Urinary Tract: No adrenal nodule or mass. Kidneys  unremarkable. No evidence for hydroureter. The urinary bladder appears normal for the degree of distention. Stomach/Bowel: Stomach is unremarkable. No gastric wall thickening. No evidence of outlet obstruction. Duodenum is normally positioned as is the ligament of Treitz. No small bowel wall thickening. No small bowel dilatation. The terminal ileum is normal. The appendix is normal. No gross colonic mass. No colonic wall thickening. Vascular/Lymphatic: No abdominal aortic aneurysm. No abdominal aortic atherosclerotic calcification. There is no gastrohepatic or hepatoduodenal ligament lymphadenopathy. No retroperitoneal or mesenteric lymphadenopathy. No pelvic sidewall lymphadenopathy. Reproductive: Hysterectomy.  There is no adnexal mass. Other: No intraperitoneal free fluid. Musculoskeletal: Tiny umbilical hernia contains only fat. No worrisome lytic or sclerotic osseous abnormality. Status post L4-5 fusion. IMPRESSION: 1. No acute findings in the abdomen or pelvis. Specifically, no findings to explain the patient's history of right lower quadrant  pain. 2. Tiny umbilical hernia contains only fat. Electronically Signed   By: Kennith Center M.D.   On: 07/19/2023 11:46    Procedures Procedures    Medications Ordered in ED Medications  sodium chloride 0.9 % bolus 1,000 mL (0 mLs Intravenous Stopped 07/19/23 1125)  ondansetron (ZOFRAN) injection 4 mg (4 mg Intravenous Given 07/19/23 0932)  morphine (PF) 4 MG/ML injection 4 mg (4 mg Intravenous Given 07/19/23 0932)  iohexol (OMNIPAQUE) 300 MG/ML solution 100 mL (100 mLs Intravenous Contrast Given 07/19/23 1032)  ondansetron (ZOFRAN) injection 4 mg (4 mg Intravenous Given 07/19/23 1104)  oxyCODONE (Oxy IR/ROXICODONE) immediate release tablet 5 mg (5 mg Oral Given 07/19/23 1125)    ED Course/ Medical Decision Making/ A&P                                 Medical Decision Making Amount and/or Complexity of Data Reviewed Labs: ordered. Radiology: ordered.  Risk Prescription drug management.   Kim Pham 43 y.o. presented today for abdominal pain.  Working DDx that I considered at this time includes, but not limited to, gastroenteritis, colitis, small bowel obstruction, appendicitis, cholecystitis, hepatobiliary pathology, gastritis, PUD, ACS, aortic dissection, diverticulosis/diverticulitis, pancreatitis, nephrolithiasis, medication induced, AAA, UTI, pyelonephritis, ruptured ectopic pregnancy, PID, ovarian torsion.  R/o DDx: colitis, small bowel obstruction, appendicitis, cholecystitis, hepatobiliary pathology, gastritis, PUD, ACS, aortic dissection, diverticulosis/diverticulitis, pancreatitis, nephrolithiasis, medication induced, AAA, UTI, pyelonephritis, ruptured ectopic pregnancy, PID, ovarian torsion: These are considered less likely due to history of present illness, physical exam, labs/imaging findings.  Review of prior external notes: 02/15/2023 office visit  Unique Tests and My Independent Interpretation:  CBC with differential: Unremarkable CMP: Unremarkable Lipase:  Unremarkable UA: Some bacteria noted Magnesium: Unremarkable Respiratory panel: Negative EKG: Rate, rhythm, axis, intervals all examined and without medically relevant abnormality. ST segments without concerns for elevations CT Abd/Pelvis with contrast: No acute pathology  Social Determinants of Health: none  Discussion with Independent Historian: None  Discussion of Management of Tests: None  Risk: Medium: prescription drug management  Risk Stratification Score: None  Plan: On exam patient was no acute distress with stable vitals but was noted to appear uncomfortable in the bed.  Patient does have right lower quadrant rebound tenderness on exam but otherwise has reassuring exam.  Patient still has her appendix so we will get labs along with imaging to rule out appendicitis.  Will give fluids along with Zofran.  Will give morphine as well for pain management as patient has tolerated this medication multiple times in the past.  Patient was in agreement with this plan.  Patient does have some bacteria in her urine however is not endorsing any urinary symptoms and so will not treat for asymptomatic bacteria.  Patient's labs are ultimately unremarkable and the CT scan by my independent interpretation does not show any acute abnormalities.  Patient does not have a uterus and so at this time do not feel the patient needs a pelvic ultrasound to rule out any OB/GYN causes that she does not have a uterus or ovaries.  at this time do feel patient is viral illness.  Patient is endorsing nausea with some pain and so we will give another round of Zofran along with Toradol as patient is tolerated Toradol through the IV in the past without difficulty or issues.  Will plan on p.o. challenge once we get the official read back from the CT scan.  Will plan on discharge with primary care follow-up along with Zofran and symptom management.  Patient's been able to tolerate fluids p.o. and has thus passed p.o.  challenge.  CT imaging was negative here and so with the CT being negative today along with 07/05/2022 very high suspicion this is a viral illness which I discussed with the patient and she agrees.  Will send Zofran and have her follow-up with her primary care provider.  I did discuss the importance of staying hydrated and eat food as tolerated to which patient verbalized understanding of.  Patient was given return precautions. Patient stable for discharge at this time.  Patient verbalized understanding of plan.  This chart was dictated using voice recognition software.  Despite best efforts to proofread,  errors can occur which can change the documentation meaning.        Final Clinical Impression(s) / ED Diagnoses Final diagnoses:  Right lower quadrant abdominal pain    Rx / DC Orders ED Discharge Orders          Ordered    ondansetron (ZOFRAN) 4 MG tablet  Every 6 hours        07/19/23 1058              Remi Deter 07/19/23 1150    Jacalyn Lefevre, MD 07/19/23 1303

## 2023-07-19 NOTE — ED Notes (Signed)
 Patient transported to CT

## 2023-07-19 NOTE — ED Triage Notes (Signed)
 C/o severe RLQ abdominal pain & N/V since yesterday. Also c/o chills & diarrhea.

## 2023-07-21 ENCOUNTER — Ambulatory Visit

## 2023-07-21 VITALS — BP 116/75 | HR 81 | Temp 98.0°F | Resp 20 | Ht 69.0 in | Wt 197.0 lb

## 2023-07-21 DIAGNOSIS — K922 Gastrointestinal hemorrhage, unspecified: Secondary | ICD-10-CM | POA: Diagnosis not present

## 2023-07-21 DIAGNOSIS — D509 Iron deficiency anemia, unspecified: Secondary | ICD-10-CM | POA: Diagnosis not present

## 2023-07-21 MED ORDER — SODIUM CHLORIDE 0.9 % IV SOLN
510.0000 mg | Freq: Once | INTRAVENOUS | Status: AC
  Administered 2023-07-21: 510 mg via INTRAVENOUS
  Filled 2023-07-21: qty 17

## 2023-07-21 NOTE — Progress Notes (Signed)
 Diagnosis: Iron Deficiency Anemia  Provider:  Chilton Greathouse MD  Procedure: IV Infusion  IV Type: Peripheral, IV Location: L Antecubital  Feraheme (Ferumoxytol), Dose: 510 mg  Infusion Start Time: 1350  Infusion Stop Time: 1407  Post Infusion IV Care: Patient declined observation and Peripheral IV Discontinued  Discharge: Condition: Good, Destination: Home . AVS Declined  Performed by:  Rico Ala, LPN

## 2023-07-28 ENCOUNTER — Encounter: Payer: Self-pay | Admitting: Gastroenterology

## 2023-07-28 ENCOUNTER — Other Ambulatory Visit: Payer: Self-pay | Admitting: Family

## 2023-07-28 ENCOUNTER — Ambulatory Visit (INDEPENDENT_AMBULATORY_CARE_PROVIDER_SITE_OTHER): Admitting: Gastroenterology

## 2023-07-28 VITALS — BP 108/60 | HR 70 | Ht 69.0 in | Wt 197.0 lb

## 2023-07-28 DIAGNOSIS — K9089 Other intestinal malabsorption: Secondary | ICD-10-CM | POA: Diagnosis not present

## 2023-07-28 DIAGNOSIS — R197 Diarrhea, unspecified: Secondary | ICD-10-CM

## 2023-07-28 DIAGNOSIS — R140 Abdominal distension (gaseous): Secondary | ICD-10-CM

## 2023-07-28 DIAGNOSIS — K648 Other hemorrhoids: Secondary | ICD-10-CM | POA: Diagnosis not present

## 2023-07-28 DIAGNOSIS — K625 Hemorrhage of anus and rectum: Secondary | ICD-10-CM

## 2023-07-28 DIAGNOSIS — R112 Nausea with vomiting, unspecified: Secondary | ICD-10-CM

## 2023-07-28 DIAGNOSIS — R1084 Generalized abdominal pain: Secondary | ICD-10-CM

## 2023-07-28 DIAGNOSIS — L290 Pruritus ani: Secondary | ICD-10-CM

## 2023-07-28 MED ORDER — CHOLESTYRAMINE 4 G PO PACK: 4.0000 g | PACK | Freq: Every day | ORAL | 0 refills | Status: AC

## 2023-07-28 MED ORDER — HYDROCORTISONE ACETATE 25 MG RE SUPP: 25.0000 mg | Freq: Every evening | RECTAL | 0 refills | Status: AC

## 2023-07-28 NOTE — Progress Notes (Signed)
 Chief Complaint:follow-up Primary GI Doctor: Dr. Chales Abrahams  HPI: 43 year old female patient status post hysterectomy and cholecystectomy with history of sicca, SLE, PNES, PE on Eliquis, who works as a Charity fundraiser, known to Dr. Chales Abrahams, for history of  bloody diarrhea since feb 2023- She does bring in pictures-bright red, seems to be away from the stool. Nl CBC, CMP CT AP with contrast 06/05/2021 showing significant stool throughout the colon. CT also showed 3 cm left adnexal cyst. Note that she had lap hysterectomy and right salpingo-oophorectomy in the past.   Pt is s/p lap resection for left adnexal cyst. Final path- neg for endometriosis or malignancy.    Sent for further GI eval.   Has been having diarrhea 2-4 Bms/day with mucus/blood at times mixed with the stool.  No urgency.  Had few nocturnal symptoms as well.  Chronic RLQ abdo pain which does not always gets relieved on defecation.  No recent antibiotics.  No recent travel.   Has has nausea. Assoc aphthous mouth ulcers x 1 year.  Occ heartburn despite as needed omeprazole.  No odynophagia or dysphagia.     Does admit that she has been using significant ibuprofen 800 mg 3 times daily x more than a year for arthritis/arthralgias.  She is being followed by Dr. Corliss Skains.  Extensive autoimmune work-up has been negative so far.  On 07/19/2023 patient went to ED for complaints of right lower quadrant pain with nausea vomiting and diarrhea.Patient's labs are ultimately unremarkable and the CT scan unremarkable.   Past GI work-up:  07/19/23 CT ABD/Pelvis W contrast 1.No acute findings in the abdomen or pelvis. Specifically, no findings to explain the patient's history of right lower quadrant pain. 2. Tiny umbilical hernia contains only fat.  07/05/23 CT ABD/pelvis W contrast IMPRESSION: 1. No acute findings. 2. Interval instrumented lumbar fusion L4-5.  09/30/2022 MRCP IMPRESSION: 1. Status post cholecystectomy. No biliary dilatation. 2. No MR  findings to explain right upper quadrant pain.  09/15/22 CT ABD W contrast IMPRESSION: Negative. No radiographic evidence of pancreatitis or other significant abnormality.  09/07/22 [place] ABD limited RUQ IMPRESSION: 1. Status post cholecystectomy. 2. Increased hepatic parenchymal echogenicity suggestive of steatosis. 3. No definite abnormality identified within the right lower quadrant however this area is difficult to assess ultrasound in adult patients. If there is persistent right lower quadrant pain, CT abdomen and pelvis is recommended.   CT AP with contrast 06/05/2021 1. High-grade stool is seen throughout all segments of the colon compatible with constipation. 2. Normal appendix. 3. There is a likely cyst measuring up to 3.1 cm within the left adnexa with intermediate density mild complexity. No ruptured adnexal cyst is seen.   Colon 08/2017 - Small internal hemorrhoids. - Otherwise normal colonoscopy to terminal ileum. Neg TI and random colon Bx.  EGD 11/25/21 Normal EGD  Colonoscopy 11/25/21, recall 11/2031 -non bleeding internal hemorrhoids - otherwise normal colon to TI.  Interval History    Patient presents with main complaint of abdominal pain, bloating, and altered bowel habits. She reports her stools have changed since her gallbladder removal last year. She states she will go 3-6 times a day with unformed stools. She has had some intermittent rectal bleeding with bowel movements. She showed me pictures today of BRB in toilet. She states it happens at least once a week. She recently was diagnosed  with iron deficiency and started on iron IV. Consumes meat. No menstrual cycle. She also has had intermittent umbilical pain that radiates to right lower  quadrant. Recent CT was negative. We reviewed all the imaging she has done in the past year including ultrasound, MRI, and multiple CT scans.      Patient has daily nausea with intermittent vomiting about 4 times a week. She is using  ondansetron prn. No weight loss. Diagnosed with norovirus few weeks ago, conservative treatment placed. No NSAID's. No new medications. No marijuana use. She has occasional pyrosis. She has occasional globus sensation she thinks is related to allergies. She states carbonated drinks make her feel worse. No known food triggers.  10/01/22 Per patient she had EGD last year with [facility] and diagnosed with suspected gastroparesis. At that time she states she was on Ozempic that since then has been discontinued.No further workup was done. Per note Reglan discontinued due to restlessness.    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  Wt Readings from Last 3 Encounters:  07/28/23 197 lb (89.4 kg)  07/21/23 197 lb (89.4 kg)  07/19/23 195 lb (88.5 kg)    Past Medical History:  Diagnosis Date   Acute pulmonary embolism without acute cor pulmonale (HCC) 05/17/2022   ADHD (attention deficit hyperactivity disorder)    Adnexal mass 02/24/2021   in epic seen on [place]   Amenorrhea 08/14/2021   Anxiety 08/14/2021   Arthralgia 08/18/2016   Back pain 09/04/2021   Blood in stool 09/08/2021   Cardiac murmur 12/16/2021   Chest discomfort 04/20/2022   Cholecystitis 05/26/2022   Chondromalacia, patella 06/30/2021   Chronic calculous cholecystitis 06/08/2022   Chronic constipation 08/14/2021   Chronic headaches HISTORY OF MIGRAINES 08/14/2021   Chronic migraine without aura, with intractable migraine, so stated, with status migrainosus 07/27/2017   Complication of anesthesia 08/14/2021   PONV   Constipation 06/11/2021   Coronary artery calcification seen on CT scan 12/16/2021   Cyst of left ovary 06/17/2021   Degenerative disc disease, lumbar 09/04/2019   Depression with anxiety 03/26/2016   Diarrhea 07/08/2017   DOE (dyspnea on exertion) 04/20/2022   Effusion, left knee 12/16/2021   Endometriosis    Family history of BRCA gene mutation 06/17/2021   [facility] genetic  testing done 06/2021 - negative for breast cancer genes.   Family history of breast cancer 09/09/2015   Formatting of this note might be different from the original.  Mom dx'd age 22  Mat aunt dx'd age 39  Neither relative tested for BRCA  Formatting of this note might be different from the original.  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 (HCC)    High risk medication use 11/22/2016   history of Chiari malformation    History of Chiari malformation 11/22/2016   history of COVID 02/2021   LOW GRADE FEVER CONGESTION AND HEADACHE ALL SYMPTOMS RESOLVED   Hot flashes 06/11/2021   Hyperlipidemia    Hypertension    Insomnia 08/19/2020   Low blood pressure    pt states history of low blood pressure-fainted 12/2010-instructed to move slowly   Nipple discharge 05/19/2022   Nonepileptic episode (HCC) 12/22/2016   Numbness and tingling 02/25/2016   Obesity (BMI 30.0-34.9) 12/16/2021   On continuous oral anticoagulation 05/17/2022   OSTEOARTHRITIS 08/14/2021   Other epilepsy, intractable, without status epilepticus (HCC) 11/01/2016   Pain in right knee  11/13/2020   Pain in unspecified joint 08/18/2016   Palpitations 12/15/2021   Pelvic pain in female 08/18/2021   Photosensitivity 11/22/2016   PONV (postoperative nausea and vomiting)    Preventative health care 09/08/2021   Psychogenic nonepileptic seizure 07/27/2017   Pulmonary embolism (HCC)    Raynaud's disease without gangrene 11/22/2016   Rectal bleeding 06/11/2021   RUQ pain 05/07/2022   Sacroiliac joint dysfunction of right side 12/19/2019   Sicca syndrome (HCC) 11/22/2016   SLE (systemic lupus erythematosus) (HCC) 03/26/2016   Status post laparoscopic cholecystectomy 06/08/2022   Subclinical hypothyroidism    Thyroid nodule    Transient  alteration of awareness 01/01/2016   Vasomotor symptoms due to menopause 06/17/2021   Voice hoarseness 05/07/2022   Wears glasses for reading 08/14/2021    Past Surgical History:  Procedure Laterality Date   ABDOMINAL HYSTERECTOMY  2007   CHOLECYSTECTOMY N/A 06/08/2022   Procedure: LAPAROSCOPIC CHOLECYSTECTOMY WITH ICG;  Surgeon: Almond Lint, MD;  Location: [facility];  Service: General;  Laterality: N/A;   CYSTOSCOPY  2007   WITH HYSTERECTOMY   DIAGNOSTIC LAPAROSCOPY  2005   LUMBAR LAMINECTOMY/DECOMPRESSION MICRODISCECTOMY Left 11/08/2022   Procedure: L4-5 FAR LEFT LATERAL DISCECTOMY;  Surgeon: London Sheer, MD;  Location: [facility];  Service: Orthopedics;  Laterality: Left;   OOPHORECTOMY Left 08/18/2021   ROTATOR CUFF REPAIR Right 2010   SALPINGOOPHORECTOMY  04/16/2011   Procedure: SALPINGO OOPHERECTOMY;  Surgeon: Lenoard Aden, MD;  Location: [facility];  Service: Gynecology;  Laterality: Right;   skull decompression surgery 2005     @ [facility]    Current Outpatient Medications  Medication Sig Dispense Refill   albuterol (VENTOLIN HFA) 108 (90 Base) MCG/ACT inhaler Inhale 2 puffs into the lungs every 6 (six) hours as needed for wheezing or shortness of breath. 8 g 0   apixaban (ELIQUIS) 5 MG TABS tablet Take 1 tablet (5 mg total) by mouth 2 (two) times daily. 60 tablet 3   atorvastatin (LIPITOR) 40 MG tablet Take 1 tablet (40 mg total) by mouth daily. 90 tablet 1   cholestyramine (QUESTRAN) 4 g packet Take 1 packet (4 g total) by mouth at bedtime. 90 packet 0   Cyanocobalamin (VITAMIN B-12 IJ) Inject 1 Application as directed every 30 (thirty) days.     cyclobenzaprine (FLEXERIL) 10 MG tablet Take 1 tablet (10 mg total) by mouth 3 (three) times daily as needed for muscle spasms. 30 tablet 0   gabapentin (NEURONTIN) 300 MG capsule TAKE 1 CAPSULE BY MOUTH 3 TIMES A DAY 90 capsule 2   hydrocortisone (ANUSOL-HC) 25 MG suppository Place 1 suppository (25 mg total) rectally at bedtime.  10 suppository 0   levothyroxine (SYNTHROID) 50 MCG tablet TAKE 1 TABLET BY MOUTH DAILY BEFORE BREAKFAST 90 tablet 0   Melatonin 10 MG TABS Take 20 mg by mouth at bedtime.     Multiple Vitamin (MULTIVITAMIN WITH MINERALS) TABS tablet Take 1 tablet by mouth daily.     ondansetron (ZOFRAN-ODT) 4 MG disintegrating tablet Take 1 tablet (4 mg total) by mouth every 8 (eight) hours as needed for nausea or vomiting. 20 tablet 0   sertraline (ZOLOFT) 100 MG tablet Take 1 tablet (100 mg total) by mouth daily. 90 tablet 1   SUMAtriptan (IMITREX) 100 MG tablet Take 1 tablet (100 mg total) by mouth at onset of migraine. Madasyn Heath repeat in 2 hours if headache persists or recurs. Do not take more than 2 doses in 24 hours. 10 tablet  2   Syringe/Needle, Disp, (SYRINGE 3CC/21GX1-1/2") 21G X 1-1/2" 3 ML MISC Use as directed 25 each 0   traZODone (DESYREL) 100 MG tablet TAKE TWO TABLETS BY MOUTH EVERY NIGHT AT BEDTIME 180 tablet 1   No current facility-administered medications for this visit.    Allergies as of 07/28/2023 - Review Complete 07/28/2023  Allergen Reaction Noted   Naltrexone Nausea Only 07/27/2022   Rosuvastatin Nausea Only 05/11/2022   Tape Itching and Other (See Comments) 05/11/2022   Tramadol Nausea And Vomiting 09/04/2021   Effexor [venlafaxine] Nausea Only and Rash 12/22/2016   Norco [hydrocodone-acetaminophen] Rash 09/04/2021   Voltaren [diclofenac] Rash 09/04/2021    Family History  Problem Relation Age of Onset   Hypertension Mother    CAD Mother    Breast cancer Mother    Migraines Mother    Gout Mother    Heart disease Mother    Crohn's disease Mother    Hypertension Father    CAD Father    CVA Father    Heart disease Father    Colon polyps Father    Liver disease Father    Sjogren's syndrome Sister    Celiac disease Sister    Rheum arthritis Maternal Grandmother    Dementia Maternal Grandmother    Pancreatic cancer Maternal Grandfather    CAD Paternal Grandmother     Macular degeneration Paternal Grandmother    CVA Paternal Grandfather    Autism Son    ADD / ADHD Son    Breast cancer Maternal Aunt    Esophageal cancer Neg Hx    Colon cancer Neg Hx    Stomach cancer Neg Hx    Review of Systems:    Constitutional: No weight loss, fever, chills, weakness or fatigue HEENT: Eyes: No change in vision               Ears, Nose, Throat:  No change in hearing or congestion Skin: No rash or itching Cardiovascular: No chest pain, chest pressure or palpitations   Respiratory: No SOB or cough Gastrointestinal: See HPI and otherwise negative Genitourinary: No dysuria or change in urinary frequency Neurological: No headache, dizziness or syncope Musculoskeletal: No new muscle or joint pain Hematologic: No bleeding or bruising Psychiatric: No history of depression or anxiety    Physical Exam:  Vital signs: BP 108/60   Pulse 70   Ht 5\' 9"  (1.753 m)   Wt 197 lb (89.4 kg)   SpO2 97%   BMI 29.09 kg/m   Constitutional:   Pleasant  female appears to be in NAD, Well developed, Well nourished, alert and cooperative Throat: Oral cavity and pharynx without inflammation, swelling or lesion.  Respiratory: Respirations even and unlabored. Lungs clear to auscultation bilaterally.   No wheezes, crackles, or rhonchi.  Cardiovascular: Normal S1, S2. Regular rate and rhythm. No peripheral edema, cyanosis or pallor.  Gastrointestinal:  Soft, nondistended, nontender. No rebound or guarding. Normal bowel sounds. No appreciable masses or hepatomegaly. Rectal: external rectal exam with excoriation noted to gluteal fold, normal rectal tone, appreciated internal hemorrhoids, non-tender, no masses, , brown stool, hemoccult N/A  Anoscopy:appreciated internal hemorrhoids Msk:  Symmetrical without gross deformities. Without edema, no deformity or joint abnormality.  Neurologic:  Alert and  oriented x4;  grossly normal neurologically.  Skin:   Dry and intact without significant  lesions or rashes. Psychiatric: Oriented to person, place and time. Demonstrates good judgement and reason without abnormal affect or behaviors.  RELEVANT LABS AND IMAGING: CBC  Latest Ref Rng & Units 07/19/2023    9:20 AM 07/08/2023    8:23 AM 07/05/2023    2:32 PM  CBC  WBC 4.0 - 10.5 K/uL 7.4  5.5  5.5   Hemoglobin 12.0 - 15.0 g/dL 16.1  09.6  04.5   Hematocrit 36.0 - 46.0 % 41.1  40.8  40.0   Platelets 150 - 400 K/uL 322  345  365.0      CMP     Latest Ref Rng & Units 07/19/2023    9:20 AM 07/08/2023    8:23 AM 07/05/2023    2:32 PM  CMP  Glucose 70 - 99 mg/dL 99  90  90   BUN 6 - 20 mg/dL 10  9  10    Creatinine 0.44 - 1.00 mg/dL 4.09  8.11  9.14   Sodium 135 - 145 mmol/L 135  140  141   Potassium 3.5 - 5.1 mmol/L 4.6  4.7  4.3   Chloride 98 - 111 mmol/L 102  102  105   CO2 22 - 32 mmol/L 23  28  29    Calcium 8.9 - 10.3 mg/dL 9.7  9.5  9.8   Total Protein 6.5 - 8.1 g/dL 7.0  7.0  6.8   Total Bilirubin 0.0 - 1.2 mg/dL 0.6  0.3  0.4   Alkaline Phos 38 - 126 U/L 99  95  100   AST 15 - 41 U/L 33  19  20   ALT 0 - 44 U/L 30  17  17       Lab Results  Component Value Date   TSH 0.89 07/05/2023    Lab Results  Component Value Date   LIPASE 30 07/19/2023    30/26/25 H. pylori breath test-not detected 07/14/23 GI profile stool-positive for norovirus, otherwise negative for bacteria or parasitic infections 05/12/22 echo- Left ventricular ejection fraction, by estimation, is 60 to 65%.   Assessment: Encounter Diagnoses  Name Primary?   Abdominal pain, generalized Yes   Nausea and vomiting, unspecified vomiting type    Bile salt-induced diarrhea    Rectal bleeding    Internal hemorrhoids    Bloating    Pruritus ani        43 year old female patient who presents with several gastrointestinal complaints that are chronic in nature but recently exacerbated when she tested positive for norovirus.  Patient complains of chronic intermittent nausea with vomiting along with upper  abdominal pain and bloating.  She states she had endoscopy last year at [facility] and was told that she Kim Pham have gastroparesis due to food found on exam.  Will go ahead and order gastric emptying study to rule out.  She can continue to use antiemetics as needed.  For the chronic diarrhea that patient states started after gallbladder removal will trial cholestyramine once daily for bile salt diarrhea.  Patient has also had issues with intermittent rectal bleeding, internal hemorrhoids and will send Anusol suppositories to use at bedtime.  Patient has a recent colonoscopy that was normal with exception of internal hemorrhoids.  She has had several imaging with no findings of diverticular disease.  We discussed if the topical suppositories do not resolve the issue we can consider hemorrhoid banding in the future.  She also had rectal irritation and excoriation and we discussed body hygiene with wet wipes and applying Calmoseptine twice daily.  Plan: - Order 4 hour gastric emptying test  -Start cholestyramine 4 gm once daily at bedtime -Start Anusol 1 suppository at  bedtime - Apply Calmoseptine once to twice daily on clean skin -Colonoscopy recall 11/2031  Thank you for the courtesy of this consult. Please call me with any questions or concerns.   Elizabella Nolet, FNP-C [facility] 07/28/2023, 11:16 AM  Cc: Sandford Craze, NP

## 2023-07-28 NOTE — Patient Instructions (Addendum)
 You have been scheduled for a gastric emptying scan at [facility] on __________ at ______________. Please arrive at least 30 minutes prior to your appointment for registration. Please make certain not to have anything to eat or drink after midnight the night before your test. Hold all stomach medications (ex: Zofran, phenergan, Reglan) 24 hours prior to your test. If you need to reschedule your appointment, please contact radiology scheduling at [phone]. _____________________________________________________________________ A gastric-emptying study measures how long it takes for food to move through your stomach. There are several ways to measure stomach emptying. In the most common test, you eat food that contains a small amount of radioactive material. A scanner that detects the movement of the radioactive material is placed over your abdomen to monitor the rate at which food leaves your stomach. This test normally takes about 4 hours to complete. _____________________________________________________________________   Bonita Quin will be contacted by [facility] Scheduling in the next 2 days to arrange a Gastric Emptying Scan .  The number on your caller ID will be [phone], please answer when they call.  If you have not heard from them in 2 days please call [phone] to schedule.    Use Calmoseptine pea sized amount per rectum 1-2 times a day after your  shower, this can be purchased over the counter  Due to recent changes in healthcare laws, you may see the results of your imaging and laboratory studies on MyChart before your provider has had a chance to review them.  We understand that in some cases there may be results that are confusing or concerning to you. Not all laboratory results come back in the same time frame and the provider may be waiting for multiple results in order to interpret others.  Please give [place] 48 hours in order for your provider to thoroughly review all the  results before contacting the office for clarification of your results.    I appreciate the  opportunity to care for you  Thank You   Deanna May,PA-C

## 2023-07-30 ENCOUNTER — Ambulatory Visit (HOSPITAL_BASED_OUTPATIENT_CLINIC_OR_DEPARTMENT_OTHER)
Admission: RE | Admit: 2023-07-30 | Discharge: 2023-07-30 | Disposition: A | Discharge status: Home / Self Care | Source: Ambulatory Visit | Attending: Family | Admitting: Family

## 2023-07-30 DIAGNOSIS — R079 Chest pain, unspecified: Secondary | ICD-10-CM | POA: Diagnosis not present

## 2023-07-30 DIAGNOSIS — I2609 Other pulmonary embolism with acute cor pulmonale: Secondary | ICD-10-CM | POA: Diagnosis not present

## 2023-07-30 DIAGNOSIS — R0602 Shortness of breath: Secondary | ICD-10-CM

## 2023-07-30 DIAGNOSIS — I2782 Chronic pulmonary embolism: Secondary | ICD-10-CM | POA: Insufficient documentation

## 2023-07-30 DIAGNOSIS — M79604 Pain in right leg: Secondary | ICD-10-CM

## 2023-07-30 MED ORDER — IOHEXOL 350 MG/ML SOLN
75.0000 mL | Freq: Once | INTRAVENOUS | Status: AC | PRN
  Administered 2023-07-30: 75 mL via INTRAVENOUS

## 2023-08-01 ENCOUNTER — Telehealth: Payer: Self-pay | Admitting: *Deleted

## 2023-08-01 NOTE — Telephone Encounter (Signed)
MyChart sent to pt with results.

## 2023-08-01 NOTE — Telephone Encounter (Signed)
-----   Message from Kennard Pea sent at 08/01/2023  9:18 AM EDT ----- CT and US  negative for PE or DVT!!!! WOOO HOOO!!!! Continue same regimen with Eliquis and we will see her in July for follow-up!!!

## 2023-08-02 ENCOUNTER — Encounter: Payer: Self-pay | Admitting: Family

## 2023-08-03 ENCOUNTER — Other Ambulatory Visit: Payer: Self-pay | Admitting: Family

## 2023-08-03 DIAGNOSIS — E039 Hypothyroidism, unspecified: Secondary | ICD-10-CM

## 2023-08-03 MED ORDER — LEVOTHYROXINE SODIUM 50 MCG PO TABS: 50.0000 ug | ORAL_TABLET | Freq: Every day | ORAL | 0 refills | Status: DC

## 2023-08-04 ENCOUNTER — Ambulatory Visit: Admitting: Behavioral Health

## 2023-08-04 ENCOUNTER — Encounter: Payer: Self-pay | Admitting: Behavioral Health

## 2023-08-09 ENCOUNTER — Ambulatory Visit (HOSPITAL_COMMUNITY)
Admission: RE | Admit: 2023-08-09 | Discharge: 2023-08-09 | Disposition: A | Discharge status: Home / Self Care | Source: Ambulatory Visit | Attending: Gastroenterology | Admitting: Gastroenterology

## 2023-08-09 DIAGNOSIS — R109 Unspecified abdominal pain: Secondary | ICD-10-CM | POA: Diagnosis not present

## 2023-08-09 DIAGNOSIS — R112 Nausea with vomiting, unspecified: Secondary | ICD-10-CM | POA: Insufficient documentation

## 2023-08-09 DIAGNOSIS — R1084 Generalized abdominal pain: Secondary | ICD-10-CM | POA: Insufficient documentation

## 2023-08-09 MED ORDER — TECHNETIUM TC 99M SULFUR COLLOID
2.0400 | Freq: Once | INTRAVENOUS | Status: DC | PRN
Start: 2023-08-09 — End: 2023-08-09

## 2023-08-09 MED ORDER — TECHNETIUM TC 99M SULFUR COLLOID
2.0400 | Freq: Once | INTRAVENOUS | Status: AC | PRN
  Administered 2023-08-09: 2.04 via ORAL

## 2023-08-10 ENCOUNTER — Other Ambulatory Visit: Payer: Self-pay | Admitting: Gastroenterology

## 2023-08-10 DIAGNOSIS — K219 Gastro-esophageal reflux disease without esophagitis: Secondary | ICD-10-CM

## 2023-08-10 MED ORDER — FAMOTIDINE 20 MG PO TABS: 20.0000 mg | ORAL_TABLET | Freq: Every day | ORAL | 2 refills | Status: AC | PRN

## 2023-08-10 MED ORDER — PANTOPRAZOLE SODIUM 40 MG PO TBEC: 40.0000 mg | DELAYED_RELEASE_TABLET | Freq: Two times a day (BID) | ORAL | 2 refills | Status: AC

## 2023-08-10 NOTE — Progress Notes (Signed)
 Pantoprazole  BID and pepcid  once daily prn sent into pharmacy per patient request

## 2023-08-12 ENCOUNTER — Other Ambulatory Visit: Payer: Self-pay | Admitting: Family

## 2023-08-12 DIAGNOSIS — E538 Deficiency of other specified B group vitamins: Secondary | ICD-10-CM

## 2023-08-14 NOTE — Progress Notes (Signed)
 Agree with assessment/plan.  Edman Circle, MD [facility] [phone]

## 2023-08-17 ENCOUNTER — Telehealth: Payer: Self-pay

## 2023-08-17 NOTE — Telephone Encounter (Signed)
-----   Message from Devin Foerster May sent at 08/10/2023  1:56 PM EDT ----- Regarding: h pylori test Tyra Galley- please have pt complete h pylori dithereix stool test please   Deanna, NP

## 2023-08-19 ENCOUNTER — Ambulatory Visit: Admitting: Behavioral Health

## 2023-08-19 ENCOUNTER — Encounter: Payer: Self-pay | Admitting: Behavioral Health

## 2023-08-31 ENCOUNTER — Other Ambulatory Visit (INDEPENDENT_AMBULATORY_CARE_PROVIDER_SITE_OTHER): Payer: Self-pay

## 2023-08-31 ENCOUNTER — Encounter: Payer: Self-pay | Admitting: Physician Assistant

## 2023-08-31 ENCOUNTER — Ambulatory Visit (INDEPENDENT_AMBULATORY_CARE_PROVIDER_SITE_OTHER): Admitting: Physician Assistant

## 2023-08-31 ENCOUNTER — Encounter: Payer: Self-pay | Admitting: Behavioral Health

## 2023-08-31 DIAGNOSIS — G8929 Other chronic pain: Secondary | ICD-10-CM

## 2023-08-31 DIAGNOSIS — M25562 Pain in left knee: Secondary | ICD-10-CM

## 2023-08-31 MED ORDER — METHYLPREDNISOLONE ACETATE 40 MG/ML IJ SUSP
40.0000 mg | INTRAMUSCULAR | Status: AC | PRN
  Administered 2023-08-31: 40 mg via INTRA_ARTICULAR

## 2023-08-31 MED ORDER — LIDOCAINE HCL 1 % IJ SOLN
3.0000 mL | INTRAMUSCULAR | Status: AC | PRN
Start: 2023-08-31 — End: 2023-08-31
  Administered 2023-08-31: 3 mL

## 2023-08-31 NOTE — Progress Notes (Signed)
 Office Visit Note   Patient: Kim Pham           Date of Birth: Jun 24, 1980           MRN: [id number] Visit Date: 08/31/2023              Requested by: Kim Gaucher, NP [address] [place],  [place] [address] PCP: Kim Gaucher, NP   Assessment & Plan: Visit Diagnoses:  1. Chronic pain of left knee     Plan: 43 year old woman 9-day history of left knee pain after twisting her knee.  She felt a pop on the medial side.  She did have some swelling initially.  Her x-rays are reassuring she does have a history of chondromalacia patella.  Certainly this could be a meniscus tear.  Will try a steroid injection told her to get a knee sleeve and we will follow-up with her in 3 weeks if she still has mechanical symptoms at that time could consider an MRI  Follow-Up Instructions: Return in about 3 weeks (around 09/21/2023).   Orders:  Orders Placed This Encounter  Procedures  . XR KNEE 3 VIEW LEFT   No orders of the defined types were placed in this encounter.     Procedures: Large Joint Inj: L knee on 08/31/2023 9:00 AM Indications: pain and diagnostic evaluation Details: 25 G 1.5 in needle, anteromedial approach  Arthrogram: No  Medications: 40 mg methylPREDNISolone  acetate 40 MG/ML; 3 mL lidocaine  1 % Outcome: tolerated well, no immediate complications Procedure, treatment alternatives, risks and benefits explained, specific risks discussed. Consent was given by the patient.     Clinical Data: No additional findings.   Subjective: No chief complaint on file.   HPI patient is a pleasant 43 year old woman presents today with a 9-day history of left knee pain.  She says its on the inner side.  She was playing with dogs and her knee twisted she heard a pop.  She has been taking Tylenol  as you well is using Voltaren  gel and ice.  Unfortunately she cannot take anti-inflammatories because she is on a blood thinner  Review of Systems  All other  systems reviewed and are negative.    Objective: Vital Signs: There were no vitals taken for this visit.  Physical Exam Constitutional:      Appearance: Normal appearance.  Pulmonary:     Effort: Pulmonary effort is normal.     Breath sounds: Normal breath sounds.  Neurological:     General: No focal deficit present.     Mental Status: She is alert and oriented to person, place, and time.  Psychiatric:        Mood and Affect: Mood normal.        Behavior: Behavior normal.    Ortho Exam Left knee mild soft tissue swelling no effusion she has good active flexion and extension.  No varus valgus instability or anterior instability she does have pain over the medial joint line neurovascular intact no erythema Specialty Comments:  EXAM: MRI LUMBAR SPINE WITHOUT CONTRAST   TECHNIQUE: Multiplanar, multisequence MR imaging of the lumbar spine was performed. No intravenous contrast was administered.   COMPARISON:  Radiograph from 08/09/2022.   FINDINGS: Segmentation: Standard. Lowest well-formed disc space labeled the L5-S1 level.   Alignment: Trace levoscoliosis. Alignment otherwise normal with preservation of the normal lumbar lordosis. No significant listhesis.   Vertebrae: Vertebral body height maintained without acute or chronic fracture. Bone marrow signal intensity within normal  limits. No discrete or worrisome osseous lesions. No abnormal marrow edema.   Conus medullaris and cauda equina: Conus extends to the L2-3 level. Conus and cauda equina appear normal.   Paraspinal and other soft tissues: Unremarkable.   Disc levels:   L1-2: Mild disc bulge. Mild bilateral facet hypertrophy. No spinal stenosis. Foramina remain patent.   L2-3: Mild annular disc bulge. Mild right greater than left facet hypertrophy. No spinal stenosis. Foramina remain patent.   L3-4: Mild diffuse disc bulge. Superimposed left foraminal to extraforaminal disc protrusion contacts the exiting  left L3 nerve root. Mild facet and ligament flavum hypertrophy. Resultant mild spinal stenosis. Foramina remain adequately patent.   L4-5: Disc desiccation with mild disc bulge. Right foraminal to extraforaminal disc protrusion with annular fissure contacts the exiting right L4 nerve root (series 8, image 30). Mild to moderate bilateral facet hypertrophy. Resultant mild narrowing of the right lateral recess. Central canal remains patent. Mild bilateral L4 foraminal stenosis.   L5-S1: Disc desiccation with minimal annular disc bulge. No canal or lateral recess stenosis. Foramina remain patent.   IMPRESSION: 1. Left foraminal to extraforaminal disc protrusion at L3-4, contacting the exiting left L3 nerve root. 2. Right foraminal to extraforaminal disc protrusion at L4-5, contacting the exiting right L4 nerve root. 3. Lower lumbar facet hypertrophy, most pronounced at L3-4 and L4-5. Finding could contribute to lower back pain.     Electronically Signed   By: Virgia Griffins M.D.   On: 08/10/2022 21:39  Imaging: XR KNEE 3 VIEW LEFT Result Date: 08/31/2023 Radiographs of her left knee demonstrate well-maintained alignment well-preserved joint spacing no acute fractures no significant degenerative changes     PMFS History: Patient Active Problem List   Diagnosis Date Noted  . Iron deficiency anemia 07/14/2023  . URI with cough and congestion 04/01/2023  . Iron deficiency 03/23/2023  . B12 deficiency 02/16/2023  . Grief reaction 01/21/2023  . Recurrent herniation of lumbar disc 12/24/2022  . Lumbar disc herniation 11/08/2022  . Pre-op evaluation 10/22/2022  . Acute pancreatitis 09/14/2022  . Nausea 09/09/2022  . Abdominal pain 09/09/2022  . Migraines 07/27/2022  . Alcohol abuse 06/25/2022  . Status post laparoscopic cholecystectomy 06/08/2022  . 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 (HCC) 05/27/2022  . On continuous oral anticoagulation 05/17/2022  .  Pulmonary embolism (HCC) 05/11/2022  . RUQ pain 05/07/2022  . Voice hoarseness 05/07/2022  . Thyroid  nodule 05/04/2022  . Former smoker 04/20/2022  . Subclinical hypothyroidism 12/18/2021  . Effusion, left knee 12/16/2021  . Endometriosis 12/16/2021  . Cardiac murmur 12/16/2021  . Obesity (BMI 30.0-34.9) 12/16/2021  . Coronary artery calcification seen on CT scan 12/16/2021  . Palpitations 12/15/2021  . Preventative health care 09/08/2021  . Hyperlipidemia 09/08/2021  . Amenorrhea 08/14/2021  . Anxiety 08/14/2021  . Chronic constipation 08/14/2021  . Chronic headaches HISTORY OF MIGRAINES 08/14/2021  . Complication of anesthesia 08/14/2021  . HISTORY OF MIGRAINES 08/14/2021  . OSTEOARTHRITIS 08/14/2021  . Wears glasses for reading 08/14/2021  . Chondromalacia, patella 06/30/2021  . Family history of BRCA gene mutation 06/17/2021  . Cyst of left ovary 06/17/2021  . Vasomotor symptoms due to menopause 06/17/2021  . Rectal bleeding 06/11/2021  . Hot flashes 06/11/2021  . Pain in left knee 11/04/2020  . Insomnia 08/19/2020  . Gastroesophageal reflux disease 08/19/2020  . Sacroiliac joint dysfunction of right side 12/19/2019  . Degenerative disc disease, lumbar 09/04/2019  . Diarrhea 07/08/2017  . Psychogenic nonepileptic seizure 12/22/2016  . Sicca syndrome (  HCC) 11/22/2016  . Raynaud's disease without gangrene 11/22/2016  . History of Chiari malformation 11/22/2016  . High risk medication use 11/22/2016  . Other epilepsy, intractable, without status epilepticus (HCC) 11/01/2016  . Arthralgia 08/18/2016  . SLE (systemic lupus erythematosus) (HCC) 03/26/2016  . Depression with anxiety 03/26/2016  . Transient alteration of awareness 01/01/2016  . Chiari malformation 12/19/2015  . Family history of breast cancer 09/09/2015   Past Medical History:  Diagnosis Date  . Acute pulmonary embolism without acute cor pulmonale (HCC) 05/17/2022  . ADHD (attention deficit hyperactivity  disorder)   . Adnexal mass 02/24/2021   in epic seen on us   . Amenorrhea 08/14/2021  . Anxiety 08/14/2021  . Arthralgia 08/18/2016  . Back pain 09/04/2021  . Blood in stool 09/08/2021  . Cardiac murmur 12/16/2021  . Chest discomfort 04/20/2022  . Cholecystitis 05/26/2022  . Chondromalacia, patella 06/30/2021  . Chronic calculous cholecystitis 06/08/2022  . Chronic constipation 08/14/2021  . Chronic headaches HISTORY OF MIGRAINES 08/14/2021  . Chronic migraine without aura, with intractable migraine, so stated, with status migrainosus 07/27/2017  . Complication of anesthesia 08/14/2021   PONV  . Constipation 06/11/2021  . Coronary artery calcification seen on CT scan 12/16/2021  . Cyst of left ovary 06/17/2021  . Degenerative disc disease, lumbar 09/04/2019  . Depression with anxiety 03/26/2016  . Diarrhea 07/08/2017  . DOE (dyspnea on exertion) 04/20/2022  . Effusion, left knee 12/16/2021  . Endometriosis   . Family history of BRCA gene mutation 06/17/2021   [facility] genetic testing done 06/2021 - negative for breast cancer genes.  . Family history of breast cancer 09/09/2015   Formatting of this note might be different from the original.  Mom dx'd age 14  Mat aunt dx'd age 56  Neither relative tested for BRCA  Formatting of this note might be different from the original.  Mom dx'd age 78  Mat aunt dx'd age 20  Neither relative tested for BRCA  . Fatigue 03/26/2016  . Former smoker 04/20/2022  . Gastroesophageal reflux disease 08/19/2020  . Hair loss 11/22/2016  . 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 (HCC)   . High risk medication use 11/22/2016  . history of Chiari malformation   . History of Chiari malformation 11/22/2016  . history of COVID 02/2021   LOW GRADE FEVER CONGESTION AND HEADACHE ALL SYMPTOMS RESOLVED  . Hot flashes 06/11/2021  . Hyperlipidemia   . Hypertension   . Insomnia 08/19/2020  . Low blood pressure    pt states history of low blood  pressure-fainted 12/2010-instructed to move slowly  . Nipple discharge 05/19/2022  . Nonepileptic episode (HCC) 12/22/2016  . Numbness and tingling 02/25/2016  . Obesity (BMI 30.0-34.9) 12/16/2021  . On continuous oral anticoagulation 05/17/2022  . OSTEOARTHRITIS 08/14/2021  . Other epilepsy, intractable, without status epilepticus (HCC) 11/01/2016  . Pain in right knee 11/13/2020  . Pain in unspecified joint 08/18/2016  . Palpitations 12/15/2021  . Pelvic pain in female 08/18/2021  . Photosensitivity 11/22/2016  . PONV (postoperative nausea and vomiting)   . Preventative health care 09/08/2021  . Psychogenic nonepileptic seizure 07/27/2017  . Pulmonary embolism (HCC)   . Raynaud's disease without gangrene 11/22/2016  . Rectal bleeding 06/11/2021  . RUQ pain 05/07/2022  . Sacroiliac joint dysfunction of right side 12/19/2019  . Sicca syndrome (HCC) 11/22/2016  . SLE (systemic lupus erythematosus) (HCC) 03/26/2016  . Status post laparoscopic cholecystectomy 06/08/2022  . Subclinical hypothyroidism   . Thyroid  nodule   .  Transient alteration of awareness 01/01/2016  . Vasomotor symptoms due to menopause 06/17/2021  . Voice hoarseness 05/07/2022  . Wears glasses for reading 08/14/2021    Family History  Problem Relation Age of Onset  . Hypertension Mother   . CAD Mother   . Breast cancer Mother   . Migraines Mother   . Gout Mother   . Heart disease Mother   . Crohn's disease Mother   . Hypertension Father   . CAD Father   . CVA Father   . Heart disease Father   . Colon polyps Father   . Liver disease Father   . Sjogren's syndrome Sister   . Celiac disease Sister   . Rheum arthritis Maternal Grandmother   . Dementia Maternal Grandmother   . Pancreatic cancer Maternal Grandfather   . CAD Paternal Grandmother   . Macular degeneration Paternal Grandmother   . CVA Paternal Grandfather   . Autism Son   . ADD / ADHD Son   . Breast cancer Maternal Aunt   . Esophageal  cancer Neg Hx   . Colon cancer Neg Hx   . Stomach cancer Neg Hx     Past Surgical History:  Procedure Laterality Date  . ABDOMINAL HYSTERECTOMY  2007  . CHOLECYSTECTOMY N/A 06/08/2022   Procedure: LAPAROSCOPIC CHOLECYSTECTOMY WITH ICG;  Surgeon: Lockie Rima, MD;  Location: [facility];  Service: General;  Laterality: N/A;  . CYSTOSCOPY  2007   WITH HYSTERECTOMY  . DIAGNOSTIC LAPAROSCOPY  2005  . LUMBAR LAMINECTOMY/DECOMPRESSION MICRODISCECTOMY Left 11/08/2022   Procedure: L4-5 FAR LEFT LATERAL DISCECTOMY;  Surgeon: Diedra Fowler, MD;  Location[facility];  Service: Orthopedics;  Laterality: Left;  . OOPHORECTOMY Left 08/18/2021  . ROTATOR CUFF REPAIR Right 2010  . SALPINGOOPHORECTOMY  04/16/2011   Procedure: SALPINGO OOPHERECTOMY;  Surgeon: Camillo Celestine, MD;  Location: [facility];  Service: Gynecology;  Laterality: Right;  . skull decompression surgery 2005     @ [facility]   Social History   Occupational History  . Occupation: nurse  Tobacco Use  . Smoking status: Former    Current packs/day: 0.00    Average packs/day: 1 pack/day for 10.0 years (10.0 ttl pk-yrs)    Types: Cigarettes    Start date: 04/04/1994    Quit date: 04/04/2004    Years since quitting: 19.4    Passive exposure: Past  . Smokeless tobacco: Never  Vaping Use  . Vaping status: Never Used  Substance and Sexual Activity  . Alcohol use: Yes    Comment: ocassional  . Drug use: Never  . Sexual activity: Yes    Birth control/protection: Surgical

## 2023-09-01 ENCOUNTER — Ambulatory Visit: Admitting: Behavioral Health

## 2023-09-22 ENCOUNTER — Ambulatory Visit: Admitting: Physician Assistant

## 2023-10-09 ENCOUNTER — Other Ambulatory Visit: Payer: Self-pay | Admitting: Family

## 2023-10-24 ENCOUNTER — Other Ambulatory Visit: Payer: Self-pay

## 2023-10-24 ENCOUNTER — Emergency Department (HOSPITAL_BASED_OUTPATIENT_CLINIC_OR_DEPARTMENT_OTHER)

## 2023-10-24 ENCOUNTER — Encounter (HOSPITAL_BASED_OUTPATIENT_CLINIC_OR_DEPARTMENT_OTHER): Payer: Self-pay | Admitting: Emergency Medicine

## 2023-10-24 ENCOUNTER — Emergency Department (HOSPITAL_BASED_OUTPATIENT_CLINIC_OR_DEPARTMENT_OTHER)
Admission: EM | Admit: 2023-10-24 | Discharge: 2023-10-24 | Disposition: A | Discharge status: Home / Self Care | Attending: Emergency Medicine | Admitting: Emergency Medicine

## 2023-10-24 DIAGNOSIS — R200 Anesthesia of skin: Secondary | ICD-10-CM | POA: Diagnosis not present

## 2023-10-24 DIAGNOSIS — R0602 Shortness of breath: Secondary | ICD-10-CM | POA: Diagnosis not present

## 2023-10-24 DIAGNOSIS — R079 Chest pain, unspecified: Secondary | ICD-10-CM | POA: Diagnosis present

## 2023-10-24 DIAGNOSIS — R0789 Other chest pain: Secondary | ICD-10-CM | POA: Diagnosis not present

## 2023-10-24 DIAGNOSIS — Z7901 Long term (current) use of anticoagulants: Secondary | ICD-10-CM | POA: Insufficient documentation

## 2023-10-24 LAB — CBC
HCT: 39.1 % (ref 36.0–46.0)
Hemoglobin: 13.4 g/dL (ref 12.0–15.0)
MCH: 31.2 pg (ref 26.0–34.0)
MCHC: 34.3 g/dL (ref 30.0–36.0)
MCV: 90.9 fL (ref 80.0–100.0)
Platelets: 299 K/uL (ref 150–400)
RBC: 4.3 MIL/uL (ref 3.87–5.11)
RDW: 13.9 % (ref 11.5–15.5)
WBC: 6.9 K/uL (ref 4.0–10.5)
nRBC: 0 % (ref 0.0–0.2)

## 2023-10-24 LAB — D-DIMER, QUANTITATIVE: D-Dimer, Quant: <0.27 ug{FEU}/mL (ref 0.00–0.50)

## 2023-10-24 LAB — BASIC METABOLIC PANEL WITH GFR
Anion gap: 15 (ref 5–15)
BUN: 14 mg/dL (ref 6–20)
CO2: 22 mmol/L (ref 22–32)
Calcium: 9.5 mg/dL (ref 8.9–10.3)
Chloride: 104 mmol/L (ref 98–111)
Creatinine, Ser: 0.83 mg/dL (ref 0.44–1.00)
GFR, Estimated: >60 mL/min (ref >60)
Glucose, Bld: 100 mg/dL — ABNORMAL HIGH (ref 70–99)
Potassium: 4 mmol/L (ref 3.5–5.1)
Sodium: 140 mmol/L (ref 135–145)

## 2023-10-24 LAB — TROPONIN T, HIGH SENSITIVITY
Troponin T High Sensitivity: <15 ng/L (ref <19)
Troponin T High Sensitivity: <15 ng/L (ref <19)

## 2023-10-24 MED ORDER — FENTANYL CITRATE PF 50 MCG/ML IJ SOSY
50.0000 ug | PREFILLED_SYRINGE | INTRAMUSCULAR | Status: DC | PRN
  Administered 2023-10-24: 50 ug via INTRAVENOUS
  Filled 2023-10-24: qty 1

## 2023-10-24 MED ORDER — PANTOPRAZOLE SODIUM 40 MG IV SOLR
40.0000 mg | Freq: Once | INTRAVENOUS | Status: AC
  Administered 2023-10-24: 40 mg via INTRAVENOUS
  Filled 2023-10-24: qty 10

## 2023-10-24 MED ORDER — ACETAMINOPHEN 500 MG PO TABS
1000.0000 mg | ORAL_TABLET | Freq: Once | ORAL | Status: AC
  Administered 2023-10-24: 1000 mg via ORAL
  Filled 2023-10-24: qty 2

## 2023-10-24 NOTE — ED Provider Notes (Signed)
 [facility] [place] Provider Note   CSN: [id number] Arrival date & time: 10/24/23  1006     Patient presents with: Chest Pain   Kim Pham is a 43 y.o. female with history of pulmonary embolus on Eliquis , presents with a constant pain to the left side of her chest that started at 3 AM this morning.  States this also radiates into the left shoulder and she also feels a slight tingling sensation in her left arm.  Pain is nonexertional, and does not radiate to the back.  She states that the pain overall does not seem to worsen with inspiration, but occasionally does.  She reports some associated shortness of breath.  No nausea or vomiting, or abdominal pain.  Denies any cough, fever, chills.  Denies any lower extremity pain or swelling.  Has been compliant with her Eliquis .  Denies any recent long plane or car rides, recent surgeries or hospitalizations.    Chest Pain      Prior to Admission medications   Medication Sig Start Date End Date Taking? Authorizing Provider  albuterol  (VENTOLIN  HFA) 108 (90 Base) MCG/ACT inhaler Inhale 2 puffs into the lungs every 6 (six) hours as needed for wheezing or shortness of breath. 04/01/23   O'Sullivan, Melissa, NP  apixaban  (ELIQUIS ) 5 MG TABS tablet Take 1 tablet (5 mg total) by mouth 2 (two) times daily. 07/08/23   Franchot Lauraine HERO, NP  atorvastatin  (LIPITOR) 40 MG tablet Take 1 tablet (40 mg total) by mouth daily. 03/24/23   O'Sullivan, Melissa, NP  cholestyramine  (QUESTRAN ) 4 g packet Take 1 packet (4 g total) by mouth at bedtime. 07/28/23   May, Deanna J, NP  Cyanocobalamin  (VITAMIN B-12 IJ) Inject 1 Application as directed every 30 (thirty) days.    [provider]  cyanocobalamin  (VITAMIN B12) 1000 MCG/ML injection INJECT 1 ML INTO THE MUSCLE ONCE MONTHLY. 08/12/23   O'Sullivan, Melissa, NP  cyclobenzaprine  (FLEXERIL ) 10 MG tablet Take 1 tablet (10 mg total) by mouth 3 (three) times daily as needed  for muscle spasms. 02/25/23   Antonio Cyndee Jamee JONELLE, DO  famotidine  (PEPCID ) 20 MG tablet Take 1 tablet (20 mg total) by mouth daily as needed for heartburn or indigestion. 08/10/23   May, Deanna J, NP  gabapentin  (NEURONTIN ) 300 MG capsule TAKE 1 CAPSULE BY MOUTH 3 TIMES A DAY 02/16/23   Georgina Ozell LABOR, MD  hydrocortisone  (ANUSOL -HC) 25 MG suppository Place 1 suppository (25 mg total) rectally at bedtime. 07/28/23   May, Deanna J, NP  levothyroxine  (SYNTHROID ) 50 MCG tablet Take 1 tablet (50 mcg total) by mouth daily before breakfast. 08/03/23   O'Sullivan, Melissa, NP  Melatonin 10 MG TABS Take 20 mg by mouth at bedtime.    [provider]  Multiple Vitamin (MULTIVITAMIN WITH MINERALS) TABS tablet Take 1 tablet by mouth daily.    [provider]  ondansetron  (ZOFRAN -ODT) 4 MG disintegrating tablet Take 1 tablet (4 mg total) by mouth every 8 (eight) hours as needed for nausea or vomiting. 04/15/23   Saguier, Dallas, PA-C  pantoprazole  (PROTONIX ) 40 MG tablet Take 1 tablet (40 mg total) by mouth 2 (two) times daily before a meal. 08/10/23 09/09/23  May, Deanna J, NP  sertraline  (ZOLOFT ) 100 MG tablet Take 1 tablet (100 mg total) by mouth daily. 08/03/23   O'Sullivan, Melissa, NP  SUMAtriptan  (IMITREX ) 100 MG tablet Take 1 tablet (100 mg total) by mouth at onset of migraine. May repeat in  2 hours if headache persists or recurs. Do not take more than 2 doses in 24 hours. 06/29/17   Ines Onetha NOVAK, MD  Syringe/Needle, Disp, (SYRINGE 3CC/21GX1-1/2) 21G X 1-1/2 3 ML MISC Use as directed 02/16/23   O'Sullivan, Melissa, NP  traZODone  (DESYREL ) 100 MG tablet TAKE TWO TABLETS BY MOUTH EVERY NIGHT AT BEDTIME 10/10/23   Daryl Setter, NP    Allergies: Naltrexone , Rosuvastatin , Tape, Tramadol , Effexor  [venlafaxine ], Norco [hydrocodone -acetaminophen ], and Voltaren  [diclofenac ]    Review of Systems  Cardiovascular:  Positive for chest pain.    Updated Vital Signs BP 100/67   Pulse (!)  58   Temp 98 F (36.7 C) (Oral)   Resp 18   Ht 5' 9 (1.753 m)   Wt 88.5 kg   SpO2 95%   BMI 28.80 kg/m   Physical Exam Vitals and nursing note reviewed.  Constitutional:      General: She is not in acute distress.    Appearance: She is well-developed.     Comments: Patient is anxious, but no acute distress  HENT:     Head: Normocephalic and atraumatic.  Eyes:     Conjunctiva/sclera: Conjunctivae normal.  Cardiovascular:     Rate and Rhythm: Normal rate and regular rhythm.     Heart sounds: No murmur heard.    Comments: Radial pulse 2+ bilaterally Pulmonary:     Effort: Pulmonary effort is normal. No respiratory distress.     Breath sounds: Normal breath sounds.     Comments: Talking in full sentences on room air without difficulty Abdominal:     Palpations: Abdomen is soft.     Tenderness: There is no abdominal tenderness.  Musculoskeletal:        General: No swelling.     Cervical back: Neck supple.     Right lower leg: No edema.     Left lower leg: No edema.  Skin:    General: Skin is warm and dry.     Capillary Refill: Capillary refill takes less than 2 seconds.  Neurological:     General: No focal deficit present.     Mental Status: She is alert.     Comments: Intact sensation in the bilateral upper extremities, reports more of a numbness sensation in the digits of the left hand compared to the right hand.  5/5 strength with hand squeeze bilaterally  Psychiatric:        Mood and Affect: Mood normal.     (all labs ordered are listed, but only abnormal results are displayed) Labs Reviewed  BASIC METABOLIC PANEL WITH GFR - Abnormal; Notable for the following components:      Result Value   Glucose, Bld 100 (*)    All other components within normal limits  CBC  D-DIMER, QUANTITATIVE  TROPONIN T, HIGH SENSITIVITY  TROPONIN T, HIGH SENSITIVITY    EKG: EKG Interpretation Date/Time:  Monday October 24 2023 10:13:50 EDT Ventricular Rate:  77 PR  Interval:  147 QRS Duration:  89 QT Interval:  391 QTC Calculation: 443 R Axis:   65  Text Interpretation: Sinus rhythm since last tracing no significant change Confirmed by Lenor Hollering [phone]) on 10/24/2023 11:02:22 AM  Radiology: [facility] Chest 2 View Result Date: 10/24/2023 CLINICAL DATA:  Left-sided chest pain radiating to left arm and neck. Shortness of breath. Diaphoresis. EXAM: CHEST - 2 VIEW COMPARISON:  12/12/2022 FINDINGS: The heart size and mediastinal contours are within normal limits. Both lungs are clear. The visualized skeletal structures are unremarkable.  IMPRESSION: No active cardiopulmonary disease. Electronically Signed   By: Norleen DELENA Kil M.D.   On: 10/24/2023 11:35     Procedures   Medications Ordered in the ED  fentaNYL  (SUBLIMAZE ) injection 50 mcg (50 mcg Intravenous Given 10/24/23 1114)  acetaminophen  (TYLENOL ) tablet 1,000 mg (1,000 mg Oral Given 10/24/23 1041)  pantoprazole  (PROTONIX ) injection 40 mg (40 mg Intravenous Given 10/24/23 1038)                                    Medical Decision Making Amount and/or Complexity of Data Reviewed Labs: ordered. Radiology: ordered.  Risk OTC drugs. Prescription drug management.     Differential diagnosis includes but is not limited to ACS, arrhythmia, aortic aneurysm, pericarditis, myocarditis, pericardial effusion, cardiac tamponade, musculoskeletal pain, GERD, Boerhaave's syndrome, DVT/PE, pneumonia, pleural effusion   ED Course:  Upon initial evaluation, patient appears somewhat anxious, but no acute distress.  Stable vitals.  EKG with normal sinus rhythm and no ST elevations.  She reports mild shortness of breath, but talking in full sentences on room air without difficulty.  Remaining at 100% oxygen saturation on room air.  Reporting continuous chest pain in the left side of her chest.   Labs Ordered: I Ordered, and personally interpreted labs.  The pertinent results include:   BMP and CBC within normal  limits Initial and repeat troponin under 15 D-dimer within normal limits  Imaging Studies ordered: I ordered imaging studies including chest x-ray I independently visualized the imaging with scope of interpretation limited to determining acute life threatening conditions related to emergency care. Imaging showed no acute abnormalities I agree with the radiologist interpretation   Cardiac Monitoring: / EKG: The patient was maintained on a cardiac monitor.  I personally viewed and interpreted the cardiac monitored which showed an underlying rhythm of: Normal sinus rhythm with no ST changes   Medications Given: Tylenol , Protonix , fentanyl   Upon re-evaluation, patient remains well-appearing with stable vitals.  States pain is still present, but well-controlled.   Low concern for ACS at this time given troponin remains stable with initial troponin of under 15 and repeat of under 15, pain non-exertional, and EKG with normal sinus rhythm and no ST changes. Chest x-ray without any acute abnormality. No concern for DVT or PE at this time given d-dimer within normal limits.  No fevers, chills, or other sick symptoms to suggest myocarditis or pericarditis. Discussed case my my attending Dr. Lenor who feels that since patient without exertional pain, negative troponins x2, normal EKG, patient is appropriate for discharge with cardiology follow up.   Impression: Atypical chest pain  Disposition:  The patient was discharged home with instructions to follow-up with cardiology, referral was sent.  She understands if she has not heard from them within the next 72 hours, she needs to call their office to set up an appointment.  May take Tylenol  and ibuprofen as needed for pain. Return precautions given.    This chart was dictated using voice recognition software, Dragon. Despite the best efforts of this provider to proofread and correct errors, errors may still occur which can change documentation  meaning.       Final diagnoses:  Atypical chest pain    ED Discharge Orders          Ordered    Ambulatory referral to Cardiology       Comments: If you have not heard from the Cardiology  office within the next 72 hours please call [phone].   10/24/23 1345               Veta Palma, PA-C 10/24/23 1411    Lenor Hollering, MD 10/24/23 1550

## 2023-10-24 NOTE — ED Triage Notes (Signed)
 Left sided chest pain radiating to left arm and neck since 3 am.  Some sob.  Some diaphoresis.  Some nausea.  No known fever.  Pt on Eliquis  due to history of PE

## 2023-10-24 NOTE — Discharge Instructions (Addendum)
 Your workup today is reassuring. It is very unlikely that your pain is due to an issue in your heart.  Your cardiac enzyme (troponin) was normal today. Your EKG which is a measure of the heart's electrical activity and rhythm is normal today. These would both show abnormalities if you were having a heart attack.  Your chest x-ray is normal today. No signs of a blood clot in the lungs.   Your blood counts, electrolytes, and kidney function were all normal today.  A referral has been sent for you to the cardiology office.  They should be contacting you to schedule an appointment. If you have not heard from the Cardiology office within the next 72 hours please call [phone].  Return to the ER if you have any worsening shortness of breath, difficulty breathing, worsening chest pain, dizziness, jaw pain, left arm or shoulder pain, abdominal pain, unexplained fever, any other new or concerning symptoms.

## 2023-10-24 NOTE — ED Notes (Signed)
 Patient transported to XR.

## 2023-10-26 ENCOUNTER — Other Ambulatory Visit: Payer: Self-pay | Admitting: Family

## 2023-10-26 ENCOUNTER — Inpatient Hospital Stay: Admission: RE | Admit: 2023-10-26 | Source: Ambulatory Visit

## 2023-11-01 ENCOUNTER — Encounter

## 2023-11-04 ENCOUNTER — Ambulatory Visit: Admitting: Family

## 2023-11-04 ENCOUNTER — Other Ambulatory Visit

## 2023-11-09 ENCOUNTER — Inpatient Hospital Stay: Admitting: Family

## 2023-11-09 ENCOUNTER — Inpatient Hospital Stay: Attending: Hematology & Oncology

## 2023-11-23 ENCOUNTER — Ambulatory Visit: Payer: Self-pay | Admitting: Family

## 2023-11-25 ENCOUNTER — Other Ambulatory Visit (HOSPITAL_BASED_OUTPATIENT_CLINIC_OR_DEPARTMENT_OTHER): Payer: Self-pay

## 2023-11-25 ENCOUNTER — Other Ambulatory Visit: Payer: Self-pay | Admitting: *Deleted

## 2023-11-25 ENCOUNTER — Encounter (HOSPITAL_BASED_OUTPATIENT_CLINIC_OR_DEPARTMENT_OTHER): Payer: Self-pay | Admitting: Pharmacist

## 2023-11-25 DIAGNOSIS — I2609 Other pulmonary embolism with acute cor pulmonale: Secondary | ICD-10-CM

## 2023-11-25 MED ORDER — APIXABAN 5 MG PO TABS
5.0000 mg | ORAL_TABLET | Freq: Two times a day (BID) | ORAL | 3 refills | Status: AC
Start: 2023-11-25 — End: ?
  Filled 2023-11-25 – 2023-11-28 (×3): qty 60, 30d supply, fill #0

## 2023-11-28 ENCOUNTER — Other Ambulatory Visit: Payer: Self-pay

## 2023-11-28 ENCOUNTER — Encounter: Payer: Self-pay | Admitting: Hematology & Oncology

## 2023-12-12 ENCOUNTER — Ambulatory Visit: Payer: Self-pay | Attending: Cardiology | Admitting: Cardiology

## 2024-01-18 ENCOUNTER — Ambulatory Visit: Admitting: Dermatology

## 2024-01-21 ENCOUNTER — Other Ambulatory Visit: Payer: Self-pay

## 2024-01-21 ENCOUNTER — Encounter (HOSPITAL_BASED_OUTPATIENT_CLINIC_OR_DEPARTMENT_OTHER): Payer: Self-pay | Admitting: Emergency Medicine

## 2024-01-21 ENCOUNTER — Emergency Department (HOSPITAL_BASED_OUTPATIENT_CLINIC_OR_DEPARTMENT_OTHER)
Admission: EM | Admit: 2024-01-21 | Discharge: 2024-01-21 | Discharge status: Against Medical Advice | Payer: Self-pay | Attending: Emergency Medicine | Admitting: Emergency Medicine

## 2024-01-21 DIAGNOSIS — Z5321 Procedure and treatment not carried out due to patient leaving prior to being seen by health care provider: Secondary | ICD-10-CM | POA: Insufficient documentation

## 2024-01-21 DIAGNOSIS — R1011 Right upper quadrant pain: Secondary | ICD-10-CM | POA: Insufficient documentation

## 2024-01-21 LAB — COMPREHENSIVE METABOLIC PANEL WITH GFR
ALT: 22 U/L (ref 0–44)
AST: 24 U/L (ref 15–41)
Albumin: 4.3 g/dL (ref 3.5–5.0)
Alkaline Phosphatase: 83 U/L (ref 38–126)
Anion gap: 12 (ref 5–15)
BUN: 12 mg/dL (ref 6–20)
CO2: 24 mmol/L (ref 22–32)
Calcium: 9.4 mg/dL (ref 8.9–10.3)
Chloride: 104 mmol/L (ref 98–111)
Creatinine, Ser: 0.82 mg/dL (ref 0.44–1.00)
GFR, Estimated: >60 mL/min (ref >60)
Glucose, Bld: 99 mg/dL (ref 70–99)
Potassium: 4.1 mmol/L (ref 3.5–5.1)
Sodium: 139 mmol/L (ref 135–145)
Total Bilirubin: 0.2 mg/dL (ref 0.0–1.2)
Total Protein: 6.7 g/dL (ref 6.5–8.1)

## 2024-01-21 LAB — CBC WITH DIFFERENTIAL/PLATELET
Abs Immature Granulocytes: 0.02 K/uL (ref 0.00–0.07)
Basophils Absolute: 0 K/uL (ref 0.0–0.1)
Basophils Relative: 1 %
Eosinophils Absolute: 0.1 K/uL (ref 0.0–0.5)
Eosinophils Relative: 1 %
HCT: 39 % (ref 36.0–46.0)
Hemoglobin: 13.1 g/dL (ref 12.0–15.0)
Immature Granulocytes: 0 %
Lymphocytes Relative: 28 %
Lymphs Abs: 2 K/uL (ref 0.7–4.0)
MCH: 31.5 pg (ref 26.0–34.0)
MCHC: 33.6 g/dL (ref 30.0–36.0)
MCV: 93.8 fL (ref 80.0–100.0)
Monocytes Absolute: 0.3 K/uL (ref 0.1–1.0)
Monocytes Relative: 4 %
Neutro Abs: 4.8 K/uL (ref 1.7–7.7)
Neutrophils Relative %: 66 %
Platelets: 278 K/uL (ref 150–400)
RBC: 4.16 MIL/uL (ref 3.87–5.11)
RDW: 13.4 % (ref 11.5–15.5)
WBC: 7.2 K/uL (ref 4.0–10.5)
nRBC: 0 % (ref 0.0–0.2)

## 2024-01-21 LAB — URINALYSIS, ROUTINE W REFLEX MICROSCOPIC
Bilirubin Urine: NEGATIVE
Glucose, UA: NEGATIVE mg/dL
Hgb urine dipstick: NEGATIVE
Ketones, ur: NEGATIVE mg/dL
Leukocytes,Ua: NEGATIVE
Nitrite: NEGATIVE
Protein, ur: NEGATIVE mg/dL
Specific Gravity, Urine: 1.015 (ref 1.005–1.030)
pH: 7 (ref 5.0–8.0)

## 2024-01-21 LAB — D-DIMER, QUANTITATIVE: D-Dimer, Quant: <0.27 ug{FEU}/mL (ref 0.00–0.50)

## 2024-01-21 LAB — LIPASE, BLOOD: Lipase: 36 U/L (ref 11–51)

## 2024-01-21 NOTE — ED Notes (Signed)
 Called in [place], 2nd call. Informed registration she was going to car to get a jacker 1 hr ago and never returned

## 2024-01-21 NOTE — ED Notes (Signed)
 Called x 1 in [place], no answer

## 2024-01-21 NOTE — ED Triage Notes (Signed)
 Pt reports RUQ pain with radiation to back since this morning; sts feels similar to when she had a PE last year

## 2024-01-25 IMAGING — US US PELVIS COMPLETE
1 series · 14 of 25 positions shown · non-contrast
Comparison: Pelvic ultrasound, dated September 17, 2004, abdomen and
pelvis CT dated September 29, 2016.

CLINICAL DATA: History of hysterectomy and right-sided oophorectomy
with left adnexal cyst seen on CT.

EXAM:
TRANSABDOMINAL ULTRASOUND OF PELVIS
TECHNIQUE: Transabdominal ultrasound examination of the pelvis was performed
including evaluation of the uterus, ovaries, adnexal regions, and
pelvic cul-de-sac.

[Series 1: us pelvis complete · 28 acquisitions, 14 frames shown]
[im 1/28]
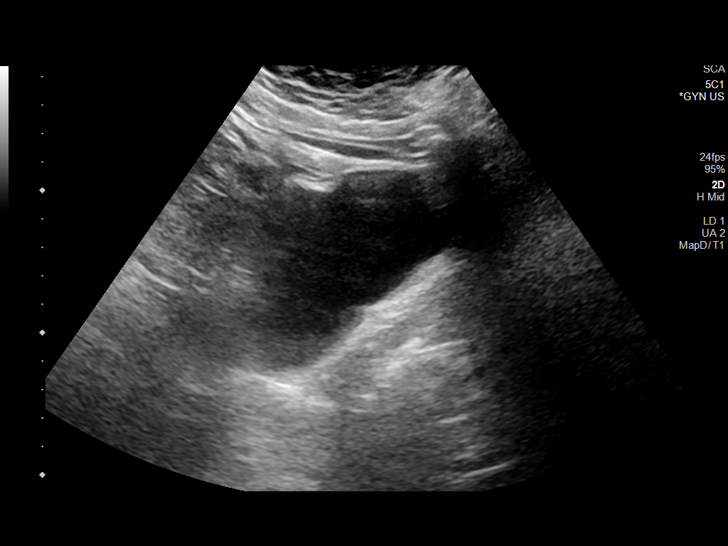
[im 3/28]
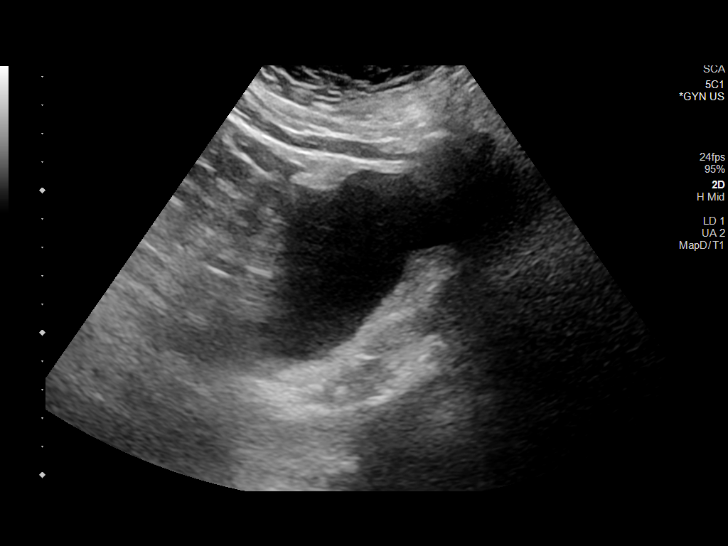
[im 5/28]
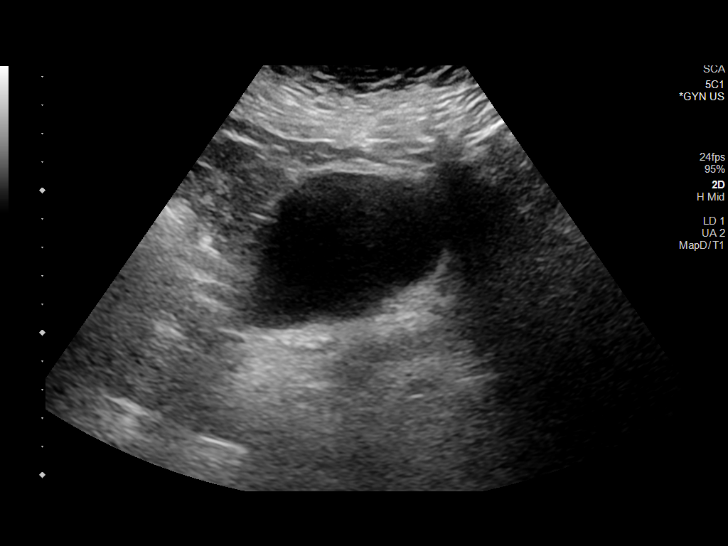
[im 7/28]
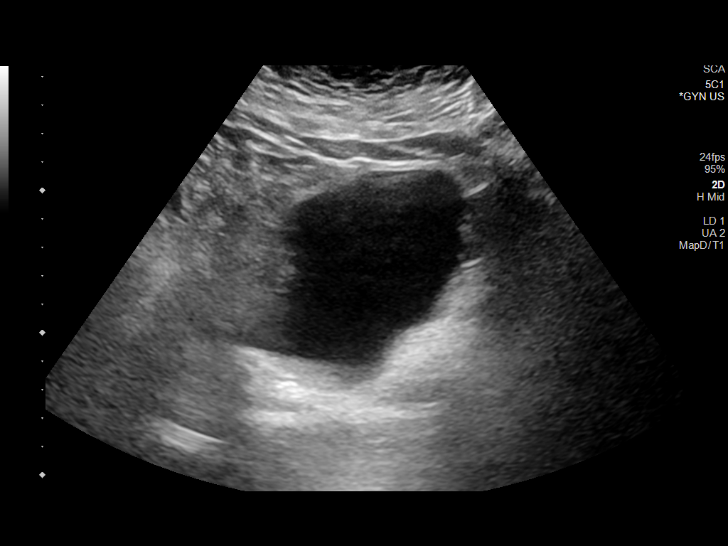
[im 10/28]
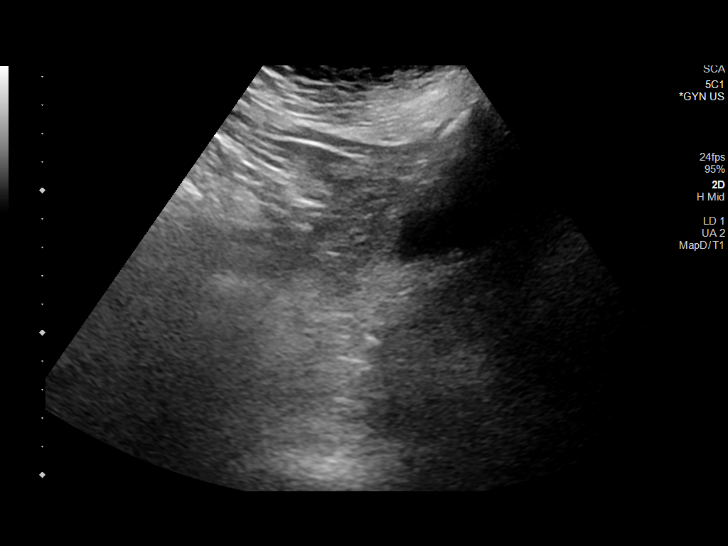
[im 11/28]
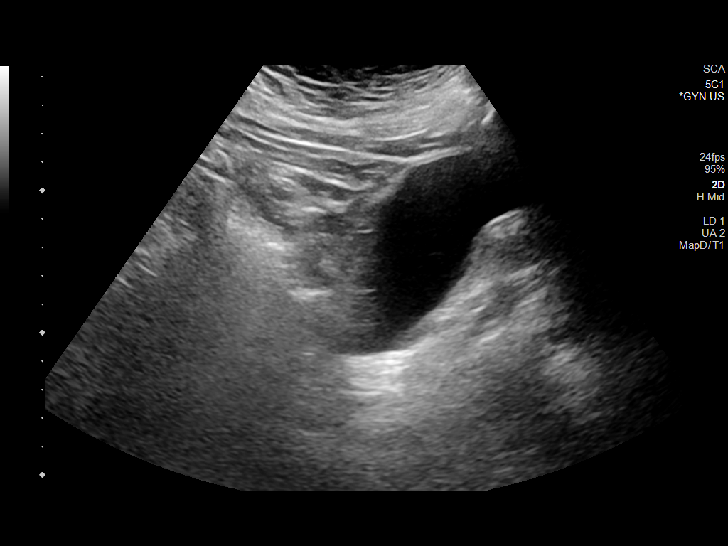
[im 13/28]
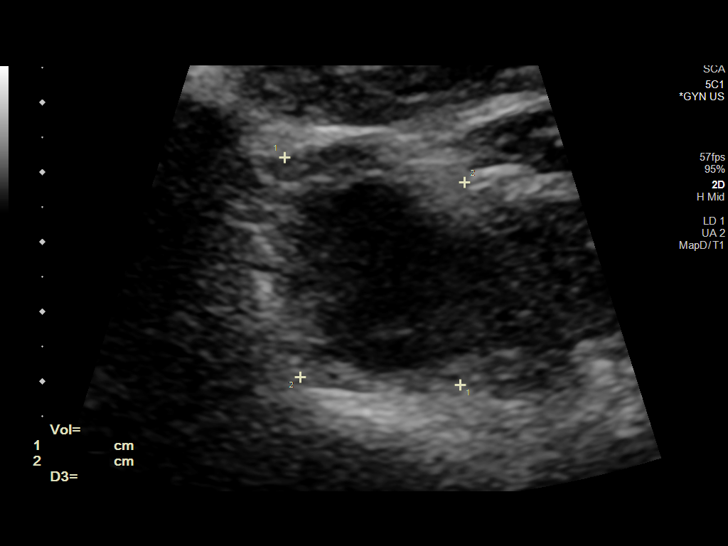
[im 15/28]
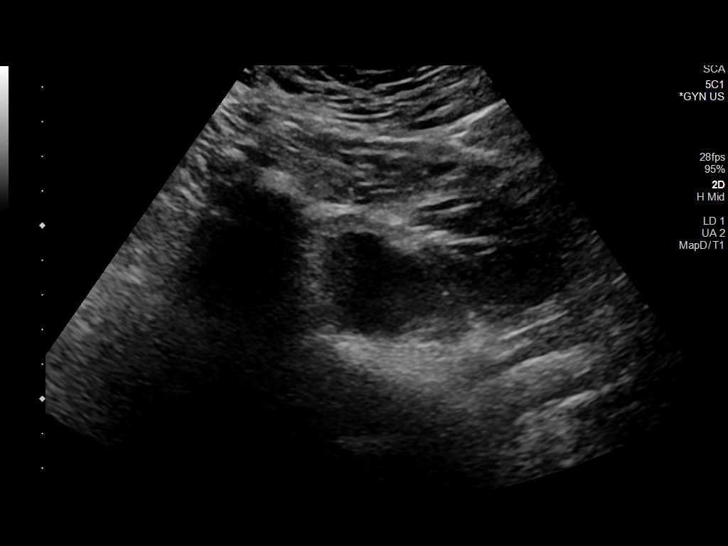
[im 17/28]
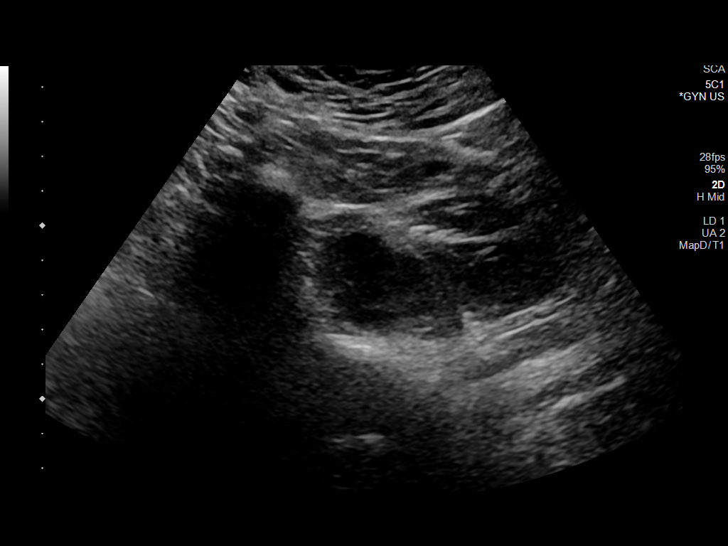
[im 19/28]
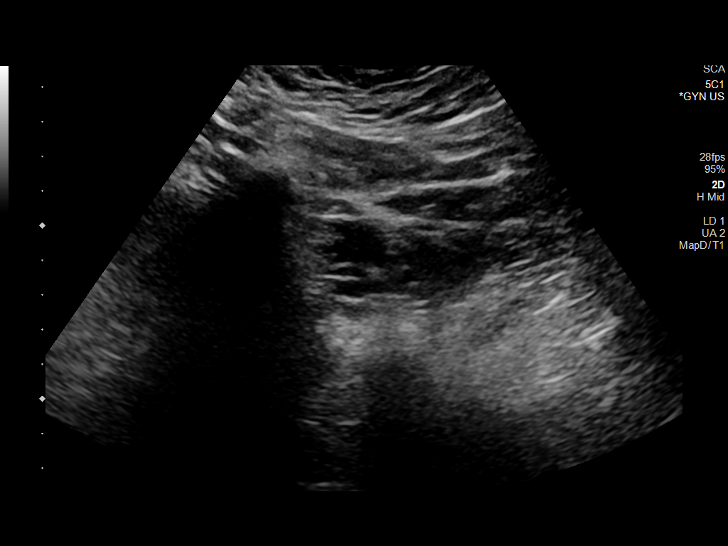
[im 21/28]
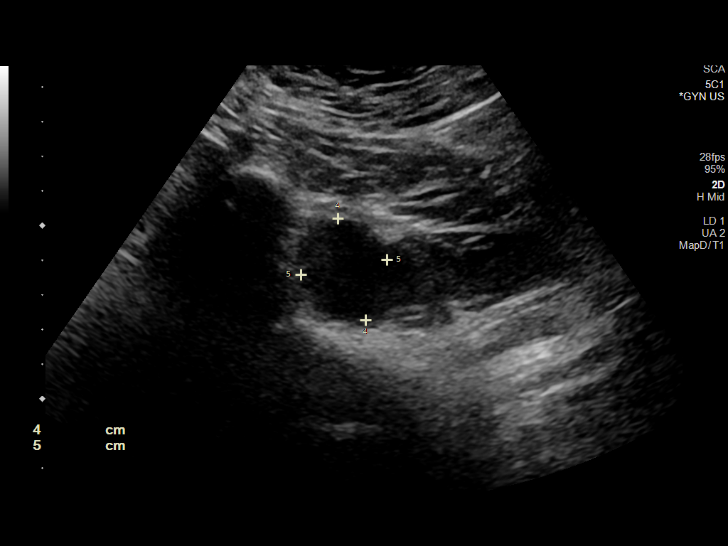
[im 23/28]
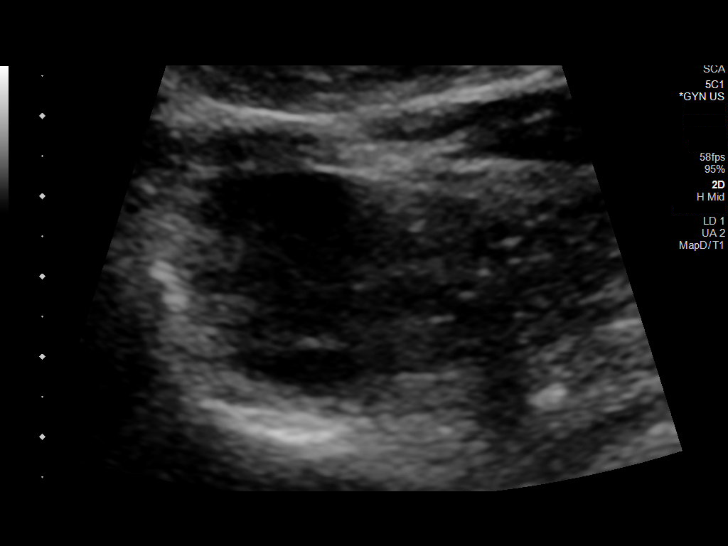
[im 25/28]
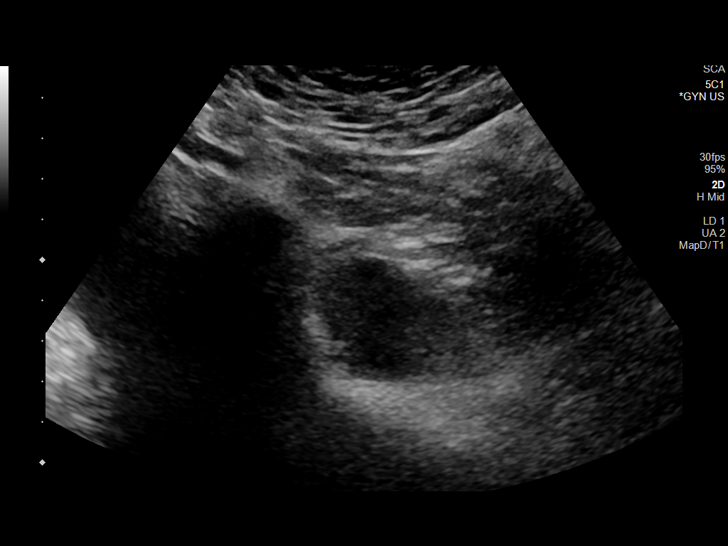
[im 28/28]
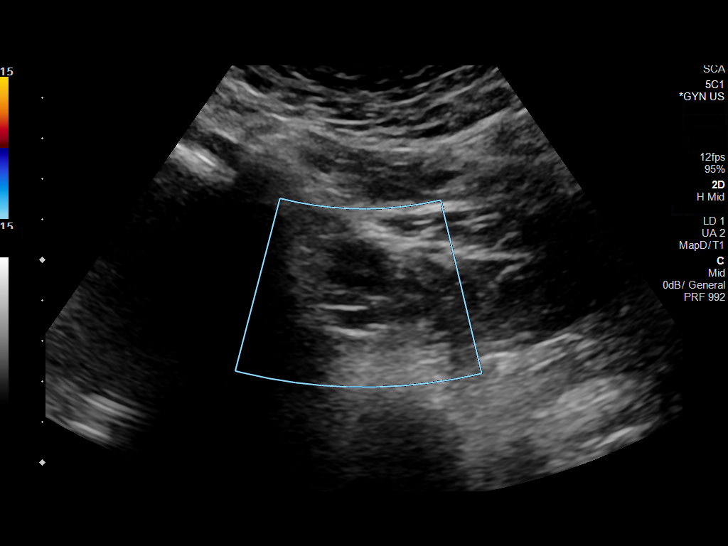

[14 of 25 positions shown; findings below may reference images not displayed]

FINDINGS: Uterus

The uterus is surgically absent.

Endometrium

Thickness: N/A.

Right ovary

The right ovary is surgically absent.

Left ovary

Measurements: 4.1 cm x 3.7 cm x 3.1 cm = volume: 24.3 mL. A 3.0 cm x
2.5 cm x 2.1 cm multi-septated anechoic structure is seen within the
left ovary. This area is not visualized on the prior exams. No
abnormal flow is seen within this region on color Doppler
evaluation.

Other findings:  No abnormal free fluid.
IMPRESSION: 1. Evidence of prior hysterectomy and prior right oophorectomy.
2. Complex, multi-septated left ovarian cyst. Surgical consultation
and follow-up with pelvic MRI is recommended to exclude the presence
of a neoplastic process.

## 2024-01-26 ENCOUNTER — Other Ambulatory Visit: Payer: Self-pay | Admitting: Family

## 2024-02-20 ENCOUNTER — Encounter: Payer: Self-pay | Admitting: Radiology

## 2024-02-29 ENCOUNTER — Other Ambulatory Visit: Payer: Self-pay | Admitting: Family

## 2024-02-29 DIAGNOSIS — E039 Hypothyroidism, unspecified: Secondary | ICD-10-CM

## 2024-03-19 ENCOUNTER — Ambulatory Visit: Payer: Self-pay | Admitting: Family

## 2024-03-19 ENCOUNTER — Ambulatory Visit (HOSPITAL_BASED_OUTPATIENT_CLINIC_OR_DEPARTMENT_OTHER)
Admission: RE | Admit: 2024-03-19 | Discharge: 2024-03-19 | Disposition: A | Payer: Self-pay | Source: Ambulatory Visit | Attending: Family | Admitting: Family

## 2024-03-19 VITALS — BP 132/73 | HR 76 | Temp 98.3°F | Resp 16 | Ht 69.0 in | Wt 207.0 lb

## 2024-03-19 DIAGNOSIS — R051 Acute cough: Secondary | ICD-10-CM | POA: Insufficient documentation

## 2024-03-19 DIAGNOSIS — J9801 Acute bronchospasm: Secondary | ICD-10-CM

## 2024-03-19 DIAGNOSIS — R0602 Shortness of breath: Secondary | ICD-10-CM

## 2024-03-19 DIAGNOSIS — J209 Acute bronchitis, unspecified: Secondary | ICD-10-CM

## 2024-03-19 MED ORDER — BENZONATATE 100 MG PO CAPS: 100.0000 mg | ORAL_CAPSULE | Freq: Three times a day (TID) | ORAL | 0 refills | Status: AC | PRN

## 2024-03-19 MED ORDER — PREDNISONE 10 MG PO TABS: ORAL_TABLET | ORAL | 0 refills | Status: AC

## 2024-03-19 MED ORDER — ALBUTEROL SULFATE (2.5 MG/3ML) 0.083% IN NEBU: 2.5000 mg | INHALATION_SOLUTION | Freq: Four times a day (QID) | RESPIRATORY_TRACT | 0 refills | Status: AC | PRN

## 2024-03-19 NOTE — Progress Notes (Unsigned)
 Subjective:     Patient ID: Kim Pham, female    DOB: 06-Jul-1980, 43 y.o.   MRN: [id number]  Chief Complaint  Patient presents with   Cough    Patient complains of severe cough for about 2 weeks   Nasal Congestion   Fever    Patient reports  a fever for about  one week, up to 104.6     HPI  Discussed the use of AI scribe software for clinical note transcription with the patient, who gave verbal consent to proceed.  History of Present Illness Kim Pham is a 43 year old female who presents with a severe cough and fever.  She has experienced a severe cough for the past two weeks, which has intensified over the last week. The cough is so severe that it has led to vomiting and urinary and fecal incontinence. There is a change in her sputum color from light green to dark yellow-brown, and now to a copper color.  She has had a fever for one week, with the highest recorded temperature being 104.52F. Initially, she felt some relief after taking Motrin and Tylenol , but the fever persisted at a low-grade level around 100F.  She feels winded and experiences a heavy sensation in her chest. No chest pain is reported. Her oxygen saturation has been around 92%, and her pulse rate has been elevated, reaching 120 bpm at one point. Albuterol  does not alleviate her symptoms and may exacerbate her cough.  She tested negative for COVID-19 and flu. Her family members, [place] and [place], experienced similar symptoms but recovered within seven days.  She works as a engineer, civil (consulting) in a primary clinic and has been exposed to many patients with respiratory symptoms. No sore throat is reported.      Health Maintenance Due  Topic Date Due   Pneumococcal Vaccine (1 of 2 - PCV) Never done   Hepatitis B Vaccines 19-59 Average Risk (1 of 3 - 19+ 3-dose series) Never done   HPV VACCINES (1 - Risk 3-dose SCDM series) Never done   Influenza Vaccine  11/18/2023   COVID-19 Vaccine (6 - 2025-26 season)  12/19/2023    Past Medical History:  Diagnosis Date   Acute pulmonary embolism without acute cor pulmonale (HCC) 05/17/2022   ADHD (attention deficit hyperactivity disorder)    Adnexal mass 02/24/2021   in epic seen on us    Amenorrhea 08/14/2021   Anxiety 08/14/2021   Arthralgia 08/18/2016   Back pain 09/04/2021   Blood in stool 09/08/2021   Cardiac murmur 12/16/2021   Chest discomfort 04/20/2022   Cholecystitis 05/26/2022   Chondromalacia, patella 06/30/2021   Chronic calculous cholecystitis 06/08/2022   Chronic constipation 08/14/2021   Chronic headaches HISTORY OF MIGRAINES 08/14/2021   Chronic migraine without aura, with intractable migraine, so stated, with status migrainosus 07/27/2017   Complication of anesthesia 08/14/2021   PONV   Constipation 06/11/2021   Coronary artery calcification seen on CT scan 12/16/2021   Cyst of left ovary 06/17/2021   Degenerative disc disease, lumbar 09/04/2019   Depression with anxiety 03/26/2016   Diarrhea 07/08/2017   DOE (dyspnea on exertion) 04/20/2022   Effusion, left knee 12/16/2021   Endometriosis    Family history of BRCA gene mutation 06/17/2021   [facility] genetic testing done 06/2021 - negative for breast cancer genes.   Family history of breast cancer 09/09/2015   Formatting of this note might be different from the original.  Mom dx'd age 59  Mat aunt  dx'd age 47  Neither relative tested for BRCA  Formatting of this note might be different from the original.  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    High risk medication use 11/22/2016   history of Chiari malformation    History of Chiari malformation 11/22/2016   history of COVID 02/2021   LOW GRADE FEVER CONGESTION AND HEADACHE ALL SYMPTOMS RESOLVED   Hot flashes 06/11/2021    Hyperlipidemia    Hypertension    Insomnia 08/19/2020   Low blood pressure    pt states history of low blood pressure-fainted 12/2010-instructed to move slowly   Nipple discharge 05/19/2022   Nonepileptic episode (HCC) 12/22/2016   Numbness and tingling 02/25/2016   Obesity (BMI 30.0-34.9) 12/16/2021   On continuous oral anticoagulation 05/17/2022   OSTEOARTHRITIS 08/14/2021   Other epilepsy, intractable, without status epilepticus (HCC) 11/01/2016   Pain in right knee 11/13/2020   Pain in unspecified joint 08/18/2016   Palpitations 12/15/2021   Pelvic pain in female 08/18/2021   Photosensitivity 11/22/2016   PONV (postoperative nausea and vomiting)    Preventative health care 09/08/2021   Psychogenic nonepileptic seizure 07/27/2017   Pulmonary embolism (HCC)    Raynaud's disease without gangrene 11/22/2016   Rectal bleeding 06/11/2021   RUQ pain 05/07/2022   Sacroiliac joint dysfunction of right side 12/19/2019   Sicca syndrome 11/22/2016   SLE (systemic lupus erythematosus) (HCC) 03/26/2016   Status post laparoscopic cholecystectomy 06/08/2022   Subclinical hypothyroidism    Thyroid  nodule    Transient alteration of awareness 01/01/2016   Vasomotor symptoms due to menopause 06/17/2021   Voice hoarseness 05/07/2022   Wears glasses for reading 08/14/2021    Past Surgical History:  Procedure Laterality Date   ABDOMINAL HYSTERECTOMY  2007   CHOLECYSTECTOMY N/A 06/08/2022   Procedure: LAPAROSCOPIC CHOLECYSTECTOMY WITH ICG;  Surgeon: Aron Shoulders, MD;  Location: [facility];  Service: General;  Laterality: N/A;   CYSTOSCOPY  2007   WITH HYSTERECTOMY   DIAGNOSTIC LAPAROSCOPY  2005   LUMBAR LAMINECTOMY/DECOMPRESSION MICRODISCECTOMY Left 11/08/2022   Procedure: L4-5 FAR LEFT LATERAL DISCECTOMY;  Surgeon: Georgina Ozell LABOR, MD;  Location: [facility];  Service: Orthopedics;  Laterality: Left;   OOPHORECTOMY Left 08/18/2021   ROTATOR CUFF REPAIR Right 2010   SALPINGOOPHORECTOMY  04/16/2011    Procedure: SALPINGO OOPHERECTOMY;  Surgeon: Charlie JINNY Flowers, MD;  Location: [facility];  Service: Gynecology;  Laterality: Right;   skull decompression surgery 2005     @ [facility]    Family History  Problem Relation Age of Onset   Hypertension Mother    CAD Mother    Breast cancer Mother    Migraines Mother    Gout Mother    Heart disease Mother    Crohn's disease Mother    Hypertension Father    CAD Father    CVA Father    Heart disease Father    Colon polyps Father    Liver disease Father    Sjogren's syndrome Sister    Celiac disease Sister    Rheum arthritis Maternal Grandmother    Dementia Maternal Grandmother    Pancreatic cancer Maternal Grandfather    CAD Paternal Grandmother    Macular degeneration Paternal Grandmother    CVA Paternal Grandfather    Autism Son  ADD / ADHD Son    Breast cancer Maternal Aunt    Esophageal cancer Neg Hx    Colon cancer Neg Hx    Stomach cancer Neg Hx     Social History   Socioeconomic History   Marital status: Married    Spouse name: Not on file   Number of children: 1   Years of education: Assoc   Highest education level: Not on file  Occupational History   Occupation: nurse  Tobacco Use   Smoking status: Some Days    Current packs/day: 1.00    Average packs/day: 1 pack/day for 30.0 years (30.0 ttl pk-yrs)    Types: Cigarettes    Start date: 04/04/1994    Passive exposure: Past   Smokeless tobacco: Never  Vaping Use   Vaping status: Never Used  Substance and Sexual Activity   Alcohol use: Yes    Comment: ocassional   Drug use: Never   Sexual activity: Yes    Birth control/protection: Surgical  Other Topics Concern   Not on file  Social History Narrative   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   Married   1 son- born 2004 has autism   Enjoys exercise, special olympics with son   Drinks 4-5 cups of coffee a day       Right handed   Social Drivers of Health   Financial Resource Strain: Medium Risk (09/14/2022)    Received from [facility]   Overall Financial Resource Strain (CARDIA)    Difficulty of Paying Living Expenses: Somewhat hard  Food Insecurity: No Food Insecurity (12/28/2022)   Hunger Vital Sign    Worried About Running Out of Food in the Last Year: Never true    Ran Out of Food in the Last Year: Never true  Transportation Needs: No Transportation Needs (12/28/2022)   PRAPARE - Administrator, Civil Service (Medical): No    Lack of Transportation (Non-Medical): No  Physical Activity: Insufficiently Active (09/14/2022)   Received from [facility]   Exercise Vital Sign    On average, how many days per week do you engage in moderate to strenuous exercise (like a brisk walk)?: 3 days    On average, how many minutes do you engage in exercise at this level?: 30 min  Stress: Stress Concern Present (09/14/2022)   Received from [facility] - Occupational Stress Questionnaire    Feeling of Stress : To some extent  Social Connections: Socially Integrated (09/14/2022)   Received from [facility]   Social Network    How would you rate your social network (family, work, friends)?: Good participation with social networks  Intimate Partner Violence: Not At Risk (12/24/2022)   Humiliation, Afraid, Rape, and Kick questionnaire    Fear of Current or Ex-Partner: No    Emotionally Abused: No    Physically Abused: No    Sexually Abused: No    Outpatient Medications Prior to Visit  Medication Sig Dispense Refill   albuterol  (VENTOLIN  HFA) 108 (90 Base) MCG/ACT inhaler Inhale 2 puffs into the lungs every 6 (six) hours as needed for wheezing or shortness of breath. 8 g 0   apixaban  (ELIQUIS ) 5 MG TABS tablet Take 1 tablet (5 mg total) by mouth 2 (two) times daily. 60 tablet 3   atorvastatin  (LIPITOR) 40 MG tablet Take 1 tablet (40 mg total) by mouth daily. 90 tablet 1   cholestyramine  (QUESTRAN ) 4 g packet Take 1 packet (  4 g total) by mouth at  bedtime. 90 packet 0   Cyanocobalamin  (VITAMIN B-12 IJ) Inject 1 Application as directed every 30 (thirty) days.     cyanocobalamin  (VITAMIN B12) 1000 MCG/ML injection INJECT 1 ML INTO THE MUSCLE ONCE MONTHLY. 6 mL 4   cyclobenzaprine  (FLEXERIL ) 10 MG tablet Take 1 tablet (10 mg total) by mouth 3 (three) times daily as needed for muscle spasms. 30 tablet 0   famotidine  (PEPCID ) 20 MG tablet Take 1 tablet (20 mg total) by mouth daily as needed for heartburn or indigestion. 30 tablet 2   hydrocortisone  (ANUSOL -HC) 25 MG suppository Place 1 suppository (25 mg total) rectally at bedtime. 10 suppository 0   levothyroxine  (SYNTHROID ) 50 MCG tablet TAKE 1 TABLET BY MOUTH DAILY BEFORE BREAKFAST 90 tablet 0   Melatonin 10 MG TABS Take 20 mg by mouth at bedtime.     Multiple Vitamin (MULTIVITAMIN WITH MINERALS) TABS tablet Take 1 tablet by mouth daily.     ondansetron  (ZOFRAN -ODT) 4 MG disintegrating tablet Take 1 tablet (4 mg total) by mouth every 8 (eight) hours as needed for nausea or vomiting. 20 tablet 0   pantoprazole  (PROTONIX ) 40 MG tablet Take 1 tablet (40 mg total) by mouth 2 (two) times daily before a meal. 60 tablet 2   sertraline  (ZOLOFT ) 100 MG tablet TAKE 1 TABLET BY MOUTH DAILY 90 tablet 0   SUMAtriptan  (IMITREX ) 100 MG tablet Take 1 tablet (100 mg total) by mouth at onset of migraine. May repeat in 2 hours if headache persists or recurs. Do not take more than 2 doses in 24 hours. 10 tablet 2   Syringe/Needle, Disp, (SYRINGE 3CC/21GX1-1/2) 21G X 1-1/2 3 ML MISC Use as directed 25 each 0   traZODone  (DESYREL ) 100 MG tablet TAKE TWO TABLETS BY MOUTH EVERY NIGHT AT BEDTIME 180 tablet 1   gabapentin  (NEURONTIN ) 300 MG capsule TAKE 1 CAPSULE BY MOUTH 3 TIMES A DAY 90 capsule 2   No facility-administered medications prior to visit.    Allergies  Allergen Reactions   Naltrexone  Nausea Only   Rosuvastatin  Nausea Only   Tape Itching and Other (See Comments)    Skin itches if tape is left on  for awhile   Tramadol  Nausea And Vomiting   Effexor  [Venlafaxine ] Nausea Only and Rash   Norco [Hydrocodone -Acetaminophen ] Rash   Voltaren  [Diclofenac ] Rash    ROS See HPI    Objective:    Physical Exam Constitutional:      General: She is not in acute distress.    Appearance: Normal appearance. She is well-developed.  HENT:     Head: Normocephalic and atraumatic.     Right Ear: Tympanic membrane, ear canal and external ear normal.     Left Ear: Tympanic membrane, ear canal and external ear normal.     Mouth/Throat:     Pharynx: No pharyngeal swelling or posterior oropharyngeal erythema.  Eyes:     General: No scleral icterus. Neck:     Thyroid : No thyromegaly.  Cardiovascular:     Rate and Rhythm: Normal rate and regular rhythm.     Heart sounds: Normal heart sounds. No murmur heard. Pulmonary:     Effort: Pulmonary effort is normal. No respiratory distress.     Breath sounds: Normal breath sounds. No wheezing.  Musculoskeletal:     Cervical back: Neck supple.  Skin:    General: Skin is warm and dry.  Neurological:     Mental Status: She is alert and oriented  to person, place, and time.  Psychiatric:        Mood and Affect: Mood normal.        Behavior: Behavior normal.        Thought Content: Thought content normal.        Judgment: Judgment normal.      BP 132/73 (BP Location: Right Arm, Patient Position: Sitting, Cuff Size: Normal)   Pulse 76   Temp 98.3 F (36.8 C) (Oral)   Resp 16   Ht 5' 9 (1.753 m)   Wt 207 lb (93.9 kg)   SpO2 97%   BMI 30.57 kg/m  Wt Readings from Last 3 Encounters:  03/19/24 207 lb (93.9 kg)  01/21/24 198 lb (89.8 kg)  10/24/23 195 lb (88.5 kg)       Assessment & Plan:   Problem List Items Addressed This Visit   None Visit Diagnoses       Acute cough    -  Primary   Relevant Medications   predniSONE  (DELTASONE ) 10 MG tablet   albuterol  (PROVENTIL ) (2.5 MG/3ML) 0.083% nebulizer solution   benzonatate  (TESSALON ) 100  MG capsule   Other Relevant Orders   DG Chest 2 View       I have discontinued Kim Pham. Kim Pham gabapentin . I am also having her start on predniSONE , albuterol , and benzonatate . Additionally, I am having her maintain her SUMAtriptan , Melatonin, multivitamin with minerals, SYRINGE 3CC/21GX1-1/2, cyclobenzaprine , atorvastatin , albuterol , ondansetron , Cyanocobalamin  (VITAMIN B-12 IJ), cholestyramine , hydrocortisone , pantoprazole , famotidine , cyanocobalamin , traZODone , apixaban , sertraline , and levothyroxine .  Meds ordered this encounter  Medications   predniSONE  (DELTASONE ) 10 MG tablet    Sig: 4 tabs by mouth once daily for 2 days, then 3 tabs daily x 2 days, then 2 tabs daily x 2 days, then 1 tab daily x 2 days    Dispense:  20 tablet    Refill:  0    Supervising Provider:   DOMENICA BLACKBIRD A [[id number]]   albuterol  (PROVENTIL ) (2.5 MG/3ML) 0.083% nebulizer solution    Sig: Take 3 mLs (2.5 mg total) by nebulization every 6 (six) hours as needed for wheezing or shortness of breath.    Dispense:  75 mL    Refill:  0    Supervising Provider:   DOMENICA BLACKBIRD A [[id number]]   benzonatate  (TESSALON ) 100 MG capsule    Sig: Take 1 capsule (100 mg total) by mouth 3 (three) times daily as needed.    Dispense:  20 capsule    Refill:  0    Supervising Provider:   DOMENICA BLACKBIRD A [[id number]]

## 2024-03-20 DIAGNOSIS — J209 Acute bronchitis, unspecified: Secondary | ICD-10-CM | POA: Insufficient documentation

## 2024-03-20 NOTE — Patient Instructions (Signed)
  VISIT SUMMARY: You came in today because of a severe cough and fever that have been troubling you for the past two weeks. Your symptoms include a severe cough that has led to vomiting and incontinence, a high fever, and a change in sputum color. You also feel winded and have a heavy sensation in your chest. Your oxygen levels and pulse rate have been concerning, and you tested negative for COVID-19 and flu. Given your symptoms, we are considering a lower respiratory infection such as pneumonia or bronchitis.  YOUR PLAN: -ACUTE COUGH WITH FEVER AND SPUTUM PRODUCTION: Your severe cough and fever, along with the change in sputum color, suggest a lower respiratory infection, which could be pneumonia or bronchitis. We have ordered a chest x-ray to help determine the exact cause. In the meantime, you have been prescribed prednisone  to reduce inflammation, Tessalon  to help with the cough, and albuterol  nebulizer treatments to help with breathing. Please rest and avoid work until at least Wednesday. A work note has been provided for your missed days.  INSTRUCTIONS: Please follow up after your chest x-ray results are available. If your symptoms worsen or you experience any new symptoms, seek medical attention immediately.

## 2024-03-20 NOTE — Assessment & Plan Note (Signed)
 CXR performed and is negative for pneumonia.   Plan to treat with prednisone /albuterol /tessalon . Call if new/worsening symptoms or if symptoms are not improved in 3-4 days.

## 2024-03-23 ENCOUNTER — Other Ambulatory Visit: Payer: Self-pay

## 2024-03-23 DIAGNOSIS — R0602 Shortness of breath: Secondary | ICD-10-CM

## 2024-03-23 LAB — D-DIMER, QUANTITATIVE: D-Dimer, Quant: <0.19 ug{FEU}/mL (ref <0.50)

## 2024-03-23 MED ORDER — FLUTICASONE PROPIONATE HFA 220 MCG/ACT IN AERO: 1.0000 | INHALATION_SPRAY | Freq: Two times a day (BID) | RESPIRATORY_TRACT | 1 refills | Status: AC

## 2024-03-23 NOTE — Telephone Encounter (Signed)
 Please advise pt that I went ahead and send flovent  for her to start which should help with her breathing now that she is tapering down on prednisone . Let's bring her in today for a stat D dimer. If this comes back positive she will need to go to the ER for CT chest if it is after hours.

## 2024-03-23 NOTE — Telephone Encounter (Signed)
 Patient scheduled to come in for labs in the next hour

## 2024-03-23 NOTE — Telephone Encounter (Signed)
 Patient notified of new rx for flovent  and negative d-dimer

## 2024-03-25 ENCOUNTER — Ambulatory Visit: Payer: Self-pay | Admitting: Family

## 2024-03-25 DIAGNOSIS — E041 Nontoxic single thyroid nodule: Secondary | ICD-10-CM

## 2024-03-28 ENCOUNTER — Encounter: Payer: Self-pay | Admitting: Family

## 2024-03-28 DIAGNOSIS — G43909 Migraine, unspecified, not intractable, without status migrainosus: Secondary | ICD-10-CM

## 2024-04-03 NOTE — Progress Notes (Deleted)
 NEUROLOGY CONSULTATION NOTE  Kim Pham MRN: [id number] DOB: 08-Dec-1980  Referring provider: Eleanor Ponto, NP Primary care provider: Eleanor Ponto, NP  Reason for consult:  migraines  Assessment/Plan:   ***   Subjective:  Kim Pham is a 43 year old right-handed female with SLE, migraines and history of Chiari malformation s/p decompression and history of seizures who presents for migraines.  History supplemented by referring provider's note.   In late 2021, she began experiencing fasciculations involving her extremities.  It feels like popcorn popping under her skin.  Sometimes she can see the rippling on her arm or leg.  She started having body jerks while laying in bed at night when she is relaxed and may be close to sleep.  She then started experiencing jerks during the day.  If she is typing, her hand may jerk.  She reports feeling off-balance and clumsy.  She started having short term memory problems and started needing to write things down at work.  She is a facilities manager.  TSH from November 2021 was 0.92.  She also reports both intermittent word-finding difficulty and stuttering speech.  She had previously had COVID twice in 2020, the last case a year prior to onset of these symptoms.  In 2005, she began having syncopal spells and headaches.  She also described episodes of zoning out in which she loses awareness and exhibits twitching of the left upper and lower extremities for several minutes at a time.  MRI of brain without contrast in 2005 showed cerebellar ectopia approximately 6 mm below foramen magnum but no acute intracranial abnormality.  She subsequently underwent Chiari decompression.  She is a engineer, civil (consulting) and it would happen at work as well.  She had an extensive neurologic workup in 2017 and 2018.    Routine EEG in September 2017 showed left temporal slowing.  Follow up prolonged sleep deprived EEG the following month was normal.  MRI of brain with and  without contrast showed prior suboccipital decompression but otherwise unremarkable.  She was admitted to the epilepsy monitoring unit at [facility] in July 2018 which captured 2 habitual events of unresponsiveness but without the extremity jerking, which did not exhibit electrographic correlate, consistent with nonepileptic events but unclear if they may be migrainous as she wakes up afterwards with a headache.    I saw patient in 2022 for the twitching and her previous history.  MRI of brain with and without contrast on 07/27/2020 revealed suboccipital craniectomy without residual or recurrent Chiari malformation as well as mild patchy nonspecific chronic white matter changes, stable compared to prior imaging going back to 2005.  MRI of cervical spine performed same day showed small left paracentral disc protrusion C5-6 with mild spinal stenosis and small left foraminal disc protrusion at C6-7 but no cord syrinx or cord compression.  MRI of thoracic spine on 07/15/2020 showed mild degenerative spine disease with right paracentral disc protrusion at T8-T9 with mild spinal stenosis and subtle cord mass effect but with normal cord signal.  In addition, she has chronic low back pain.  MRI of lumbar spine on 08/10/2022 showed left foraminal to extraforaminal disc protrusion at L3-4 contacting the exiting left L3 nerve root, right foraminal to extraforaminal disc protrusion at L4-5 contacting the exiting right L4 nerve root, and lower lumbar facet hypertrophy, most pronounced at L3-4 and L4-5.   Headache and neck pain.  Repeat MRI of brain with and without contrast on 03/01/2023 showed stable suboccipital craniectomy without recurrence of Chiari  malformation but no acute intracranial abnormality.  MRI of cervical spine performed same day revealed mild spinal canal stenosis at C5-C6 and mild left neural foraminal narrowing at C6-C7, unchanged.     Past NSAIDS/analgesics:  *** Past abortive triptans:  *** Past abortive  ergotamine:  *** Past muscle relaxants:  *** Past anti-emetic:  *** Past antihypertensive medications:  *** Past antidepressant medications:  *** Past anticonvulsant medications:  *** Past anti-CGRP:  *** Past vitamins/Herbal/Supplements:  *** Past antihistamines/decongestants:  *** Other past therapies:  ***  Current NSAIDS/analgesics:  *** Current triptans:  *** Current ergotamine:  *** Current anti-emetic:  *** Current muscle relaxants:  *** Current Antihypertensive medications:  *** Current Antidepressant medications:  *** Current Anticonvulsant medications:  *** Current anti-CGRP:  *** Current Vitamins/Herbal/Supplements:  *** Current Antihistamines/Decongestants:  *** Other therapy:  *** Birth control:  *** Other medications:  ***   Caffeine:  *** Alcohol:  *** Smoker:  *** Diet:  *** Exercise:  *** Depression:  ***; Anxiety:  *** Other pain:  *** Sleep hygiene:  ***  History of TBI/concussion:  *** Family history of headache:  *** Family history of cerebral aneurysm:  ***      PAST MEDICAL HISTORY: Past Medical History:  Diagnosis Date   Acute pulmonary embolism without acute cor pulmonale (HCC) 05/17/2022   ADHD (attention deficit hyperactivity disorder)    Adnexal mass 02/24/2021   in epic seen on us    Amenorrhea 08/14/2021   Anxiety 08/14/2021   Arthralgia 08/18/2016   Back pain 09/04/2021   Blood in stool 09/08/2021   Cardiac murmur 12/16/2021   Chest discomfort 04/20/2022   Cholecystitis 05/26/2022   Chondromalacia, patella 06/30/2021   Chronic calculous cholecystitis 06/08/2022   Chronic constipation 08/14/2021   Chronic headaches HISTORY OF MIGRAINES 08/14/2021   Chronic migraine without aura, with intractable migraine, so stated, with status migrainosus 07/27/2017   Complication of anesthesia 08/14/2021   PONV   Constipation 06/11/2021   Coronary artery calcification seen on CT scan 12/16/2021   Cyst of left ovary 06/17/2021    Degenerative disc disease, lumbar 09/04/2019   Depression with anxiety 03/26/2016   Diarrhea 07/08/2017   DOE (dyspnea on exertion) 04/20/2022   Effusion, left knee 12/16/2021   Endometriosis    Family history of BRCA gene mutation 06/17/2021   [facility] genetic testing done 06/2021 - negative for breast cancer genes.   Family history of breast cancer 09/09/2015   Formatting of this note might be different from the original.  Mom dx'd age 68  Mat aunt dx'd age 61  Neither relative tested for BRCA  Formatting of this note might be different from the original.  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    High risk medication use 11/22/2016   history of Chiari malformation    History of Chiari malformation 11/22/2016   history of COVID 02/2021   LOW GRADE FEVER CONGESTION AND HEADACHE ALL SYMPTOMS RESOLVED   Hot flashes 06/11/2021   Hyperlipidemia    Hypertension    Insomnia 08/19/2020   Low blood pressure    pt states history of low blood pressure-fainted 12/2010-instructed to move slowly   Nipple discharge 05/19/2022   Nonepileptic episode (HCC) 12/22/2016   Numbness and tingling 02/25/2016   Obesity (BMI 30.0-34.9) 12/16/2021  On continuous oral anticoagulation 05/17/2022   OSTEOARTHRITIS 08/14/2021   Other epilepsy, intractable, without status epilepticus (HCC) 11/01/2016   Pain in right knee 11/13/2020   Pain in unspecified joint 08/18/2016   Palpitations 12/15/2021   Pelvic pain in female 08/18/2021   Photosensitivity 11/22/2016   PONV (postoperative nausea and vomiting)    Preventative health care 09/08/2021   Psychogenic nonepileptic seizure 07/27/2017   Pulmonary embolism (HCC)    Raynaud's disease without gangrene 11/22/2016   Rectal bleeding 06/11/2021   RUQ pain 05/07/2022    Sacroiliac joint dysfunction of right side 12/19/2019   Sicca syndrome 11/22/2016   SLE (systemic lupus erythematosus) (HCC) 03/26/2016   Status post laparoscopic cholecystectomy 06/08/2022   Subclinical hypothyroidism    Thyroid  nodule    Transient alteration of awareness 01/01/2016   Vasomotor symptoms due to menopause 06/17/2021   Voice hoarseness 05/07/2022   Wears glasses for reading 08/14/2021    PAST SURGICAL HISTORY: Past Surgical History:  Procedure Laterality Date   ABDOMINAL HYSTERECTOMY  2007   CHOLECYSTECTOMY N/A 06/08/2022   Procedure: LAPAROSCOPIC CHOLECYSTECTOMY WITH ICG;  Surgeon: Aron Shoulders, MD;  Location: [facility];  Service: General;  Laterality: N/A;   CYSTOSCOPY  2007   WITH HYSTERECTOMY   DIAGNOSTIC LAPAROSCOPY  2005   LUMBAR LAMINECTOMY/DECOMPRESSION MICRODISCECTOMY Left 11/08/2022   Procedure: L4-5 FAR LEFT LATERAL DISCECTOMY;  Surgeon: Georgina Ozell LABOR, MD;  Location: [facility];  Service: Orthopedics;  Laterality: Left;   OOPHORECTOMY Left 08/18/2021   ROTATOR CUFF REPAIR Right 2010   SALPINGOOPHORECTOMY  04/16/2011   Procedure: SALPINGO OOPHERECTOMY;  Surgeon: Charlie JINNY Flowers, MD;  Location: [facility];  Service: Gynecology;  Laterality: Right;   skull decompression surgery 2005     @ [facility]    MEDICATIONS: Medications Ordered Prior to Encounter[1]  ALLERGIES: Allergies[2]  FAMILY HISTORY: Family History  Problem Relation Age of Onset   Hypertension Mother    CAD Mother    Breast cancer Mother    Migraines Mother    Gout Mother    Heart disease Mother    Crohn's disease Mother    Hypertension Father    CAD Father    CVA Father    Heart disease Father    Colon polyps Father    Liver disease Father    Sjogren's syndrome Sister    Celiac disease Sister    Rheum arthritis Maternal Grandmother    Dementia Maternal Grandmother    Pancreatic cancer Maternal Grandfather    CAD Paternal Grandmother    Macular degeneration Paternal Grandmother     CVA Paternal Grandfather    Autism Son    ADD / ADHD Son    Breast cancer Maternal Aunt    Esophageal cancer Neg Hx    Colon cancer Neg Hx    Stomach cancer Neg Hx     Objective:  *** General: No acute distress.  Patient appears well-groomed.   Head:  Normocephalic/atraumatic Eyes:  fundi examined but not visualized Neck: supple, no paraspinal tenderness, full range of motion Heart: regular rate and rhythm Neurological Exam: Mental status: alert and oriented to person, place, and time, speech fluent and not dysarthric, language intact. Cranial nerves: CN I: not tested CN II: pupils equal, round and reactive to light, visual fields intact CN III, IV, VI:  full range of motion, no nystagmus, no ptosis CN V: facial sensation intact. CN VII: upper and lower face symmetric CN VIII: hearing intact CN IX, X: gag intact, uvula midline  CN XI: sternocleidomastoid and trapezius muscles intact CN XII: tongue midline Bulk & Tone: normal, no fasciculations. Motor:  muscle strength 5/5 throughout Sensation:  Pinprick and vibratory sensation intact. Deep Tendon Reflexes:  2+ throughout,  toes downgoing.   Finger to nose testing:  Without dysmetria.   Gait:  Normal station and stride.  Romberg negative.    Thank you for allowing me to take part in the care of this patient.  Juliene Dunnings, DO  CC: ***         [1]  Current Outpatient Medications on File Prior to Visit  Medication Sig Dispense Refill   albuterol  (PROVENTIL ) (2.5 MG/3ML) 0.083% nebulizer solution Take 3 mLs (2.5 mg total) by nebulization every 6 (six) hours as needed for wheezing or shortness of breath. 75 mL 0   albuterol  (VENTOLIN  HFA) 108 (90 Base) MCG/ACT inhaler Inhale 2 puffs into the lungs every 6 (six) hours as needed for wheezing or shortness of breath. 8 g 0   apixaban  (ELIQUIS ) 5 MG TABS tablet Take 1 tablet (5 mg total) by mouth 2 (two) times daily. 60 tablet 3   atorvastatin  (LIPITOR) 40 MG tablet Take  1 tablet (40 mg total) by mouth daily. 90 tablet 1   benzonatate  (TESSALON ) 100 MG capsule Take 1 capsule (100 mg total) by mouth 3 (three) times daily as needed. 20 capsule 0   cholestyramine  (QUESTRAN ) 4 g packet Take 1 packet (4 g total) by mouth at bedtime. 90 packet 0   Cyanocobalamin  (VITAMIN B-12 IJ) Inject 1 Application as directed every 30 (thirty) days.     cyanocobalamin  (VITAMIN B12) 1000 MCG/ML injection INJECT 1 ML INTO THE MUSCLE ONCE MONTHLY. 6 mL 4   cyclobenzaprine  (FLEXERIL ) 10 MG tablet Take 1 tablet (10 mg total) by mouth 3 (three) times daily as needed for muscle spasms. 30 tablet 0   famotidine  (PEPCID ) 20 MG tablet Take 1 tablet (20 mg total) by mouth daily as needed for heartburn or indigestion. 30 tablet 2   fluticasone  (FLOVENT  HFA) 220 MCG/ACT inhaler Inhale 1 puff into the lungs 2 (two) times daily. 1 each 1   hydrocortisone  (ANUSOL -HC) 25 MG suppository Place 1 suppository (25 mg total) rectally at bedtime. 10 suppository 0   levothyroxine  (SYNTHROID ) 50 MCG tablet TAKE 1 TABLET BY MOUTH DAILY BEFORE BREAKFAST 90 tablet 0   Melatonin 10 MG TABS Take 20 mg by mouth at bedtime.     Multiple Vitamin (MULTIVITAMIN WITH MINERALS) TABS tablet Take 1 tablet by mouth daily.     ondansetron  (ZOFRAN -ODT) 4 MG disintegrating tablet Take 1 tablet (4 mg total) by mouth every 8 (eight) hours as needed for nausea or vomiting. 20 tablet 0   pantoprazole  (PROTONIX ) 40 MG tablet Take 1 tablet (40 mg total) by mouth 2 (two) times daily before a meal. 60 tablet 2   predniSONE  (DELTASONE ) 10 MG tablet 4 tabs by mouth once daily for 2 days, then 3 tabs daily x 2 days, then 2 tabs daily x 2 days, then 1 tab daily x 2 days 20 tablet 0   sertraline  (ZOLOFT ) 100 MG tablet TAKE 1 TABLET BY MOUTH DAILY 90 tablet 0   SUMAtriptan  (IMITREX ) 100 MG tablet Take 1 tablet (100 mg total) by mouth at onset of migraine. May repeat in 2 hours if headache persists or recurs. Do not take more than 2 doses in 24  hours. 10 tablet 2   Syringe/Needle, Disp, (SYRINGE 3CC/21GX1-1/2) 21G X 1-1/2 3 ML MISC Use  as directed 25 each 0   traZODone  (DESYREL ) 100 MG tablet TAKE TWO TABLETS BY MOUTH EVERY NIGHT AT BEDTIME 180 tablet 1   No current facility-administered medications on file prior to visit.  [2]  Allergies Allergen Reactions   Naltrexone  Nausea Only   Rosuvastatin  Nausea Only   Tape Itching and Other (See Comments)    Skin itches if tape is left on for awhile   Tramadol  Nausea And Vomiting   Effexor  [Venlafaxine ] Nausea Only and Rash   Norco [Hydrocodone -Acetaminophen ] Rash   Voltaren  [Diclofenac ] Rash

## 2024-04-04 ENCOUNTER — Ambulatory Visit: Payer: Self-pay | Admitting: Neurology

## 2024-04-04 ENCOUNTER — Encounter: Payer: Self-pay | Admitting: Neurology

## 2024-04-04 DIAGNOSIS — Z029 Encounter for administrative examinations, unspecified: Secondary | ICD-10-CM

## 2024-04-08 ENCOUNTER — Other Ambulatory Visit: Payer: Self-pay | Admitting: Family

## 2024-04-10 ENCOUNTER — Ambulatory Visit (HOSPITAL_BASED_OUTPATIENT_CLINIC_OR_DEPARTMENT_OTHER): Payer: Self-pay

## 2024-04-23 ENCOUNTER — Telehealth: Payer: Self-pay | Admitting: Family

## 2024-04-23 NOTE — Telephone Encounter (Signed)
 Patient informed of this information, she reports she will get appointment schedule today

## 2024-04-23 NOTE — Telephone Encounter (Signed)
 Please contact pt and remind her to schedule her thyroid  ultrasound.

## 2024-04-29 ENCOUNTER — Other Ambulatory Visit: Payer: Self-pay | Admitting: Family

## 2024-05-19 IMAGING — MG MM DIGITAL DIAGNOSTIC UNILAT*L* W/ TOMO W/ CAD
4 series · 4 of 12 positions shown · non-contrast
Comparison: Prior mammograms dating back to 9969

CLINICAL DATA: 40-year-old female for further evaluation of
possible LEFT breast asymmetry on screening mammogram.

EXAM:
DIGITAL DIAGNOSTIC UNILATERAL LEFT MAMMOGRAM WITH TOMOSYNTHESIS AND
CAD; ULTRASOUND LEFT BREAST LIMITED
TECHNIQUE: Left digital diagnostic mammography and breast tomosynthesis was
performed. The images were evaluated with computer-aided detection.;
Targeted ultrasound examination of the left breast was performed.

[L CC synth-2D]
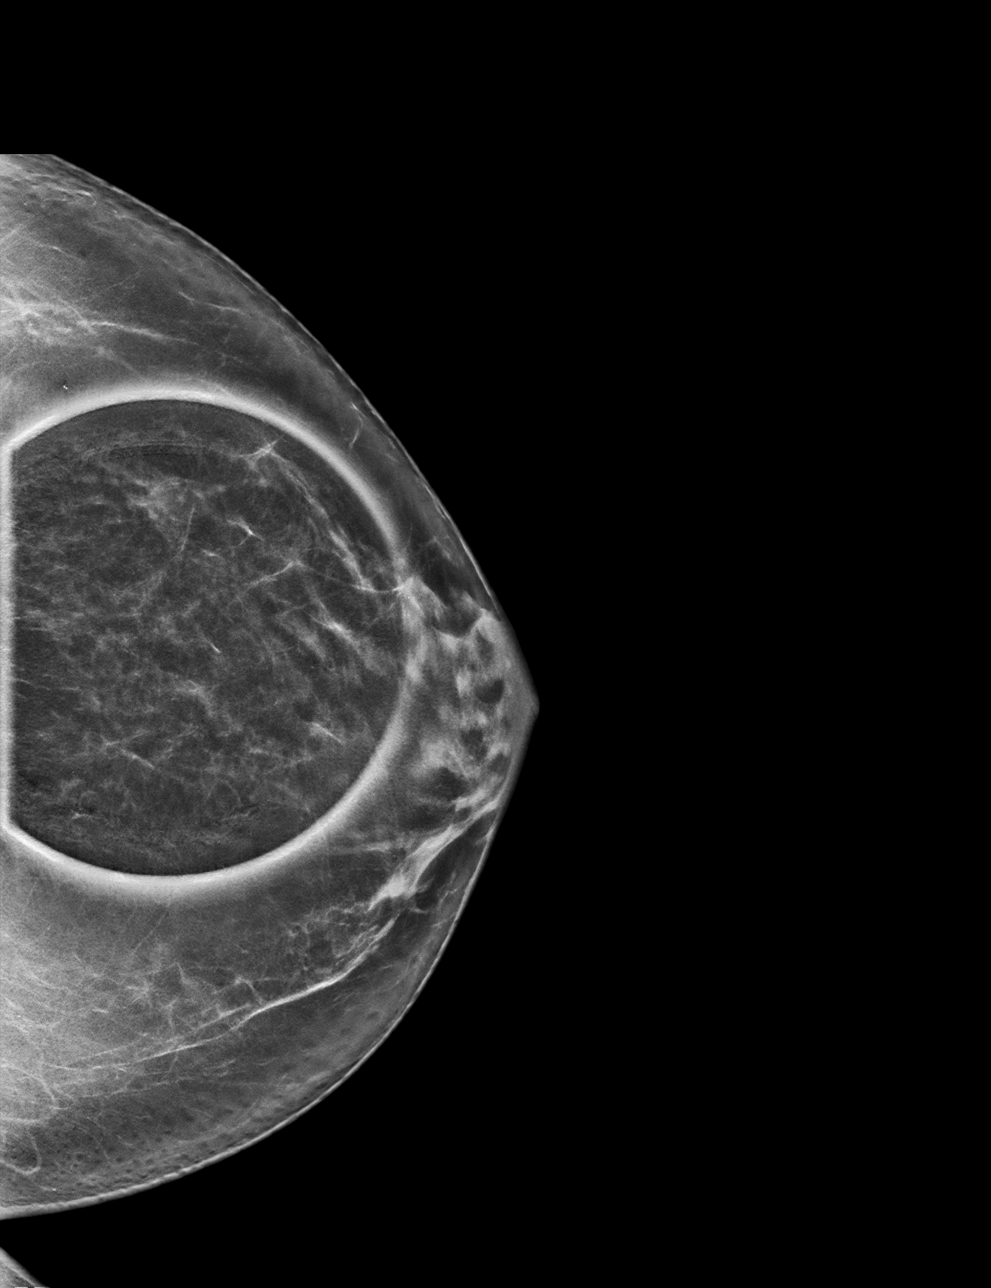

[L ML synth-2D]
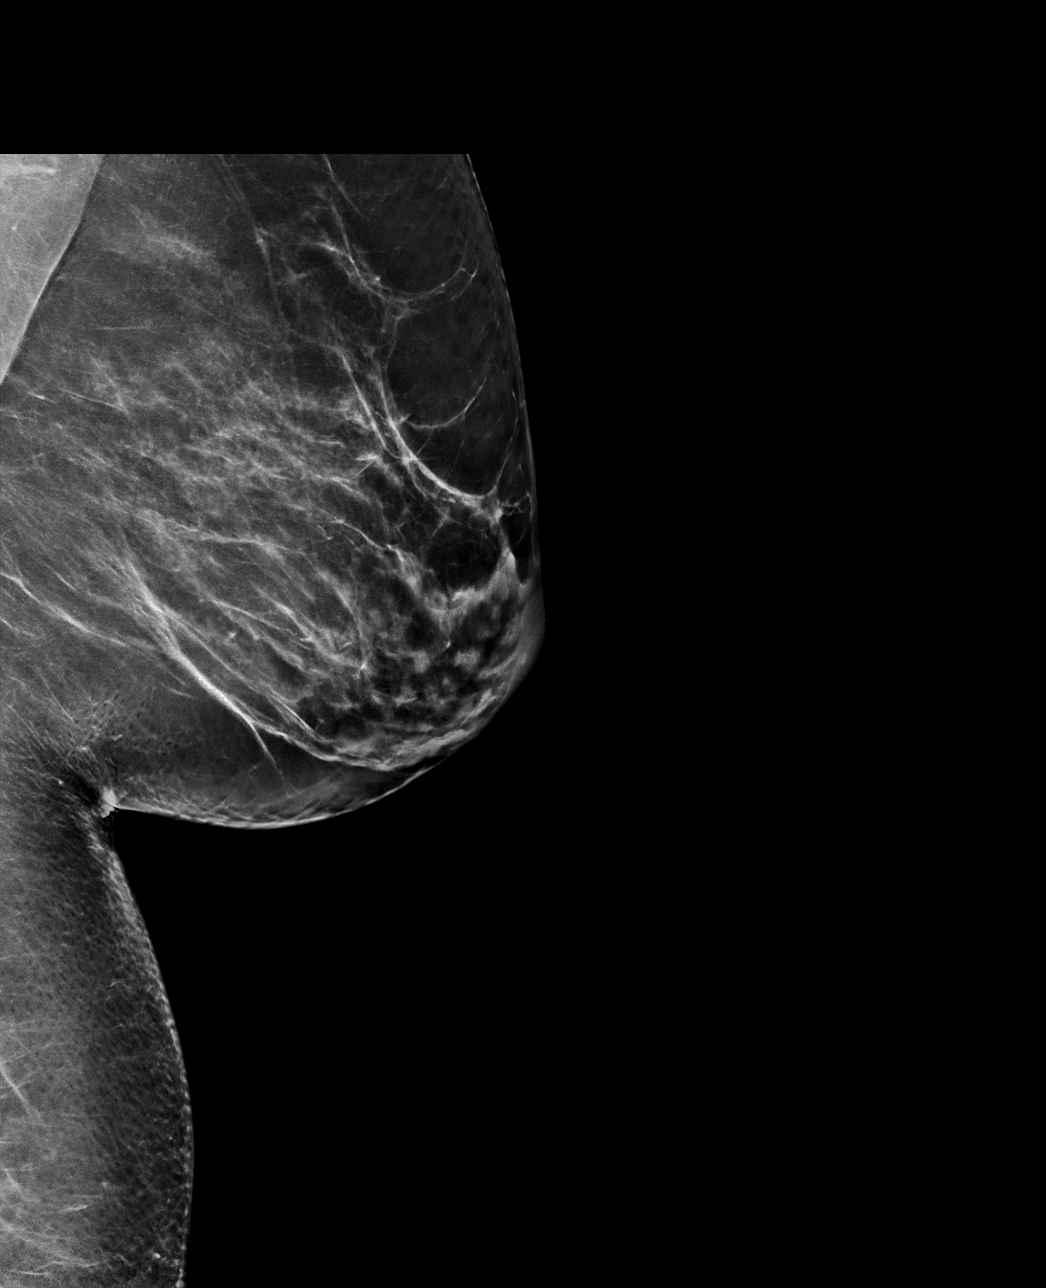

[L ML tomo · tomo slice 44/87.0]
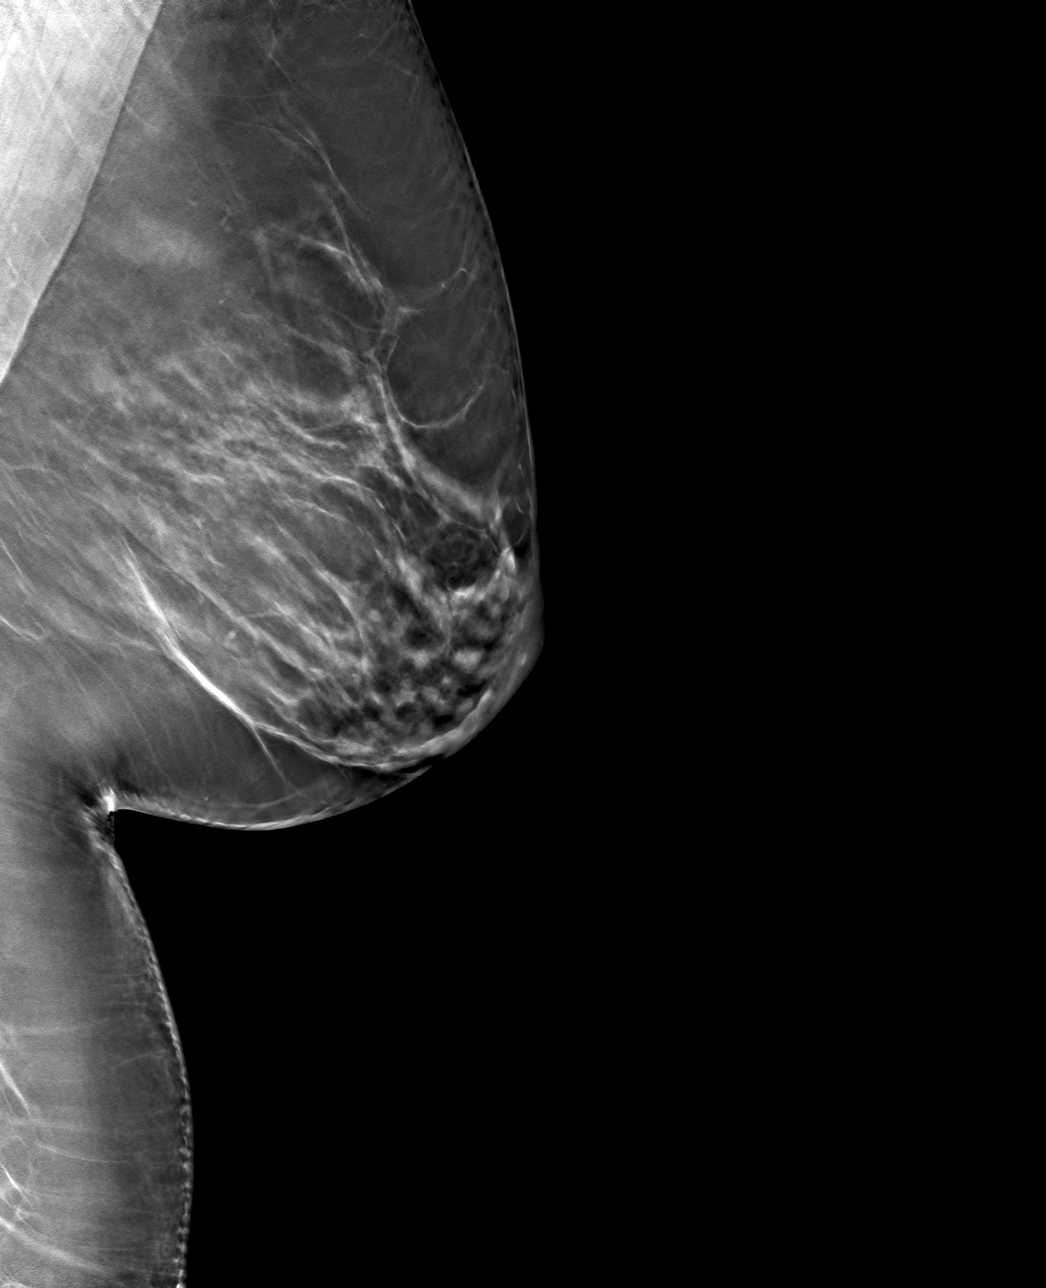

[L CC tomo · tomo slice 32/63.0]
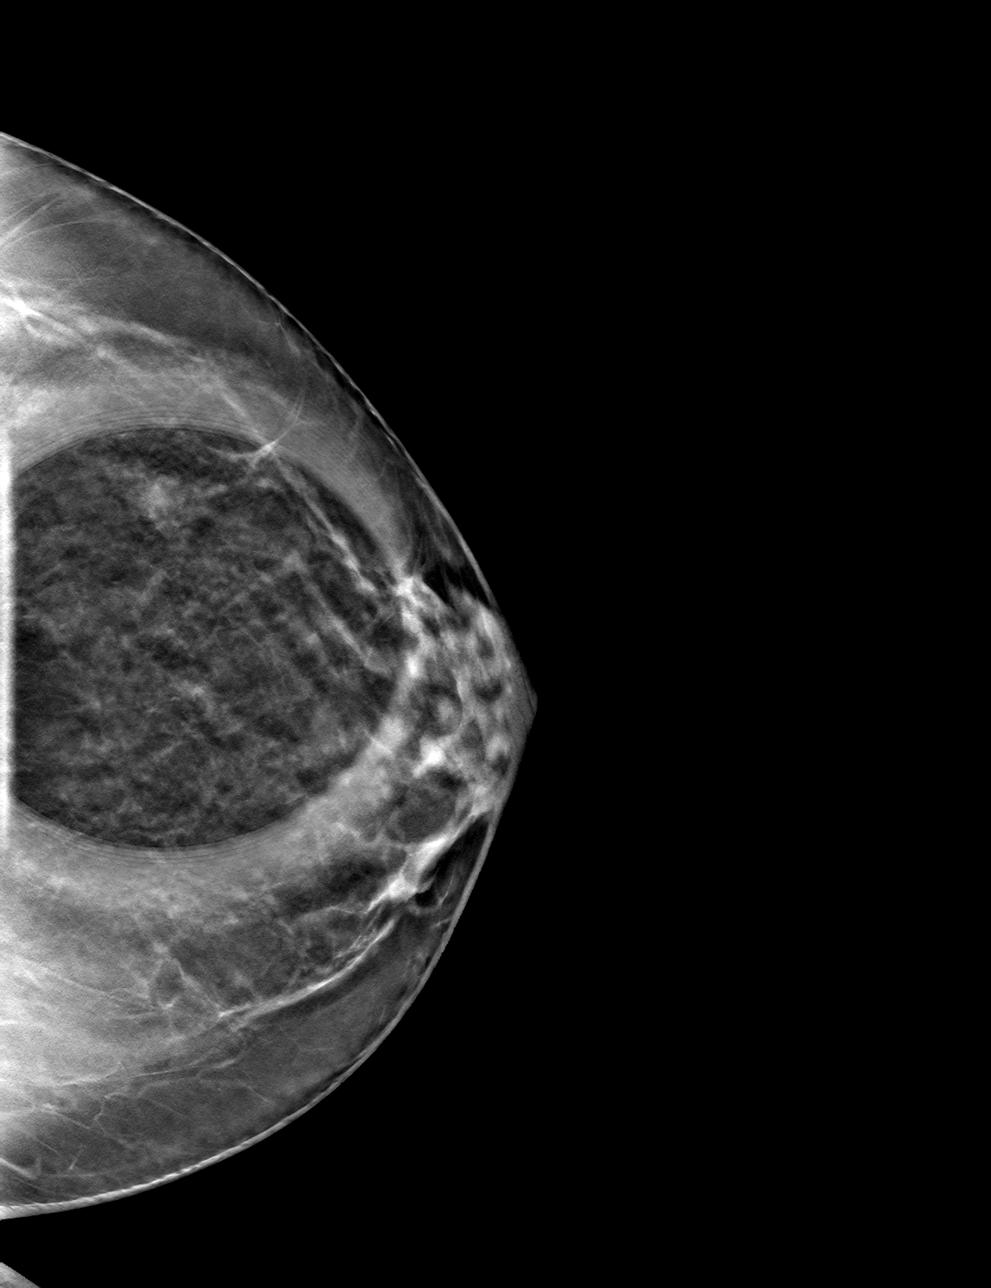

[4 of 12 positions shown; findings below may reference images not displayed]

ACR Breast Density Category b: There are scattered areas of
fibroglandular density.
FINDINGS: Full field and spot compression views of the LEFT breast demonstrate
mild persistent asymmetry within the OUTER LEFT breast.

Targeted ultrasound is performed, showing no sonographic abnormality
within the OUTER LEFT breast.
IMPRESSION: 1. Mild persistent OUTER LEFT breast asymmetry without sonographic
correlate. This most likely represents normal fibroglandular tissue
or a benign process, but 6 month follow-up is recommended to ensure
stability.

RECOMMENDATION:
LEFT diagnostic mammogram in 6 months.

I have discussed the findings and recommendations with the patient.
If applicable, a reminder letter will be sent to the patient
regarding the next appointment.

BI-RADS CATEGORY  3: Probably benign.

## 2024-05-19 IMAGING — US US BREAST*L* LIMITED INC AXILLA
1 series · 4 of 4 positions shown · non-contrast
Comparison: Prior mammograms dating back to 9969

CLINICAL DATA: 40-year-old female for further evaluation of
possible LEFT breast asymmetry on screening mammogram.

EXAM:
DIGITAL DIAGNOSTIC UNILATERAL LEFT MAMMOGRAM WITH TOMOSYNTHESIS AND
CAD; ULTRASOUND LEFT BREAST LIMITED
TECHNIQUE: Left digital diagnostic mammography and breast tomosynthesis was
performed. The images were evaluated with computer-aided detection.;
Targeted ultrasound examination of the left breast was performed.

[Series 1: us breast*left* limited inc axilla · 0.07mm/px · 4 of 4 slices shown]
[im 1/4]
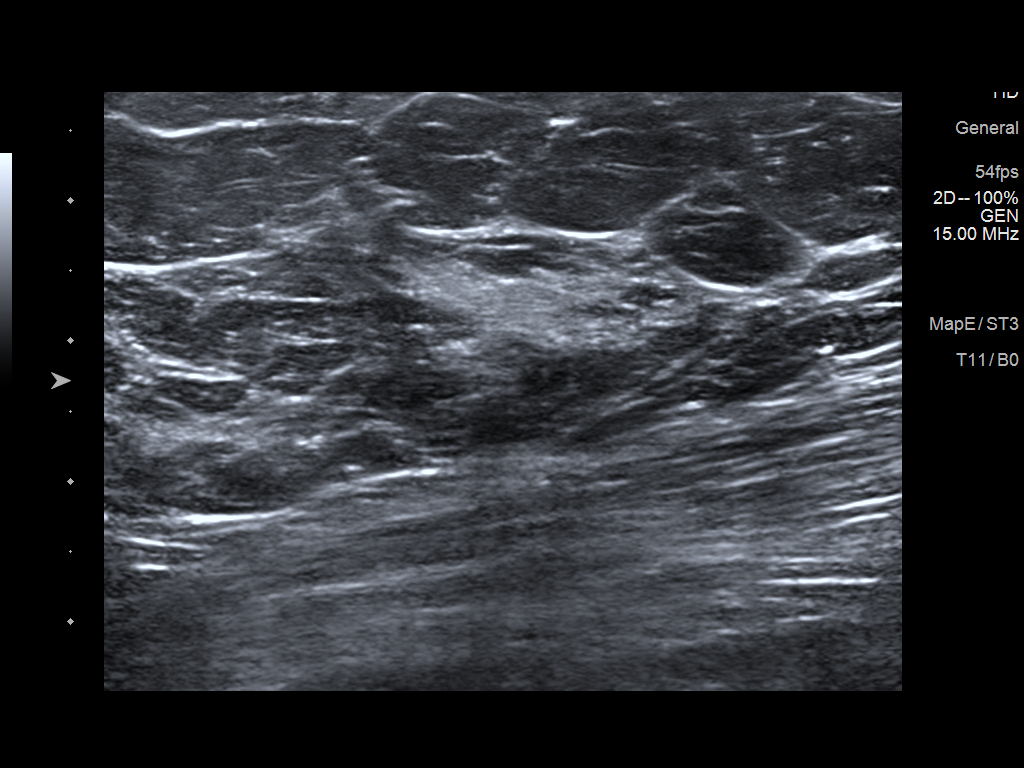
[im 2/4]
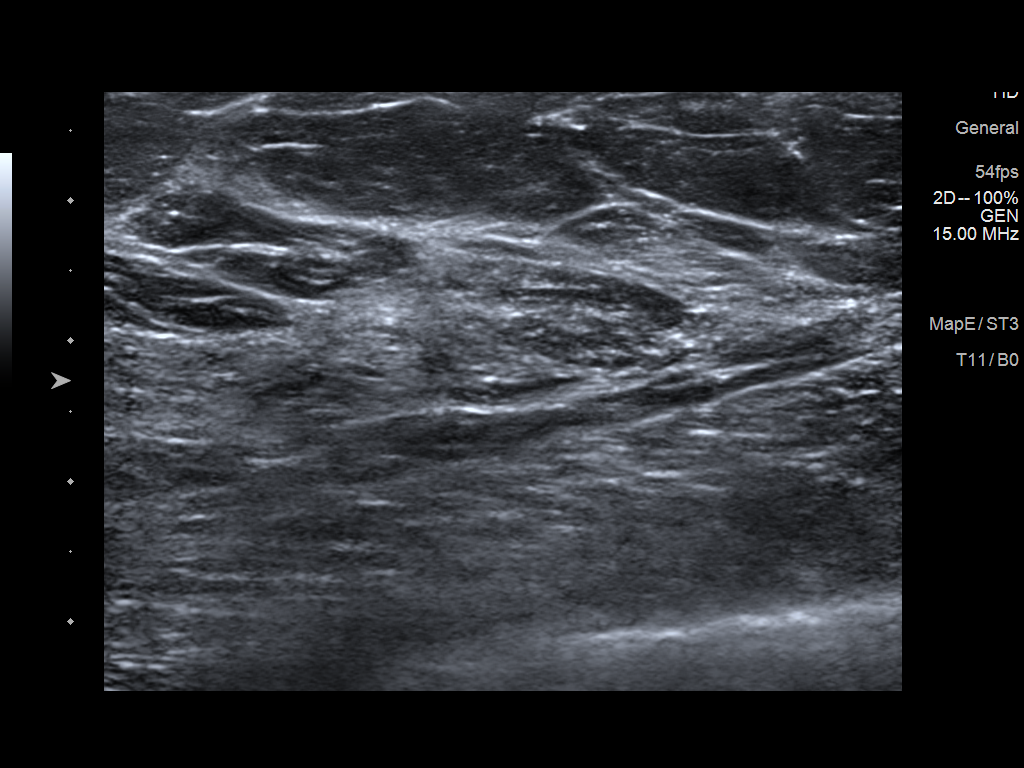
[im 3/4]
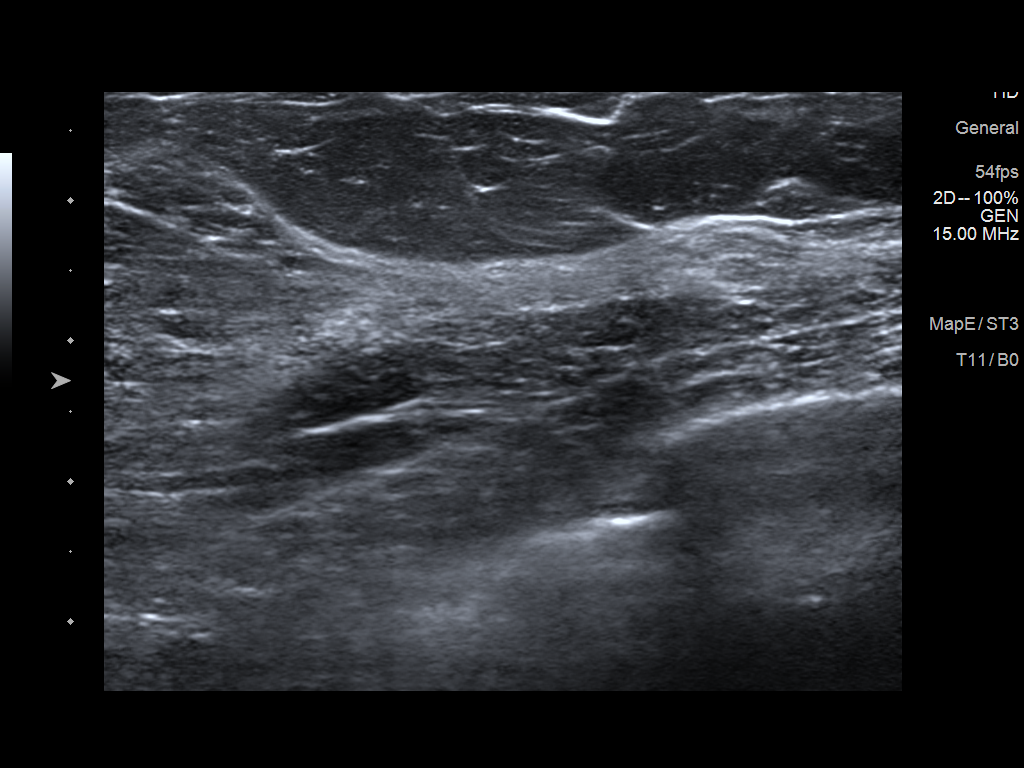
[im 4/4]
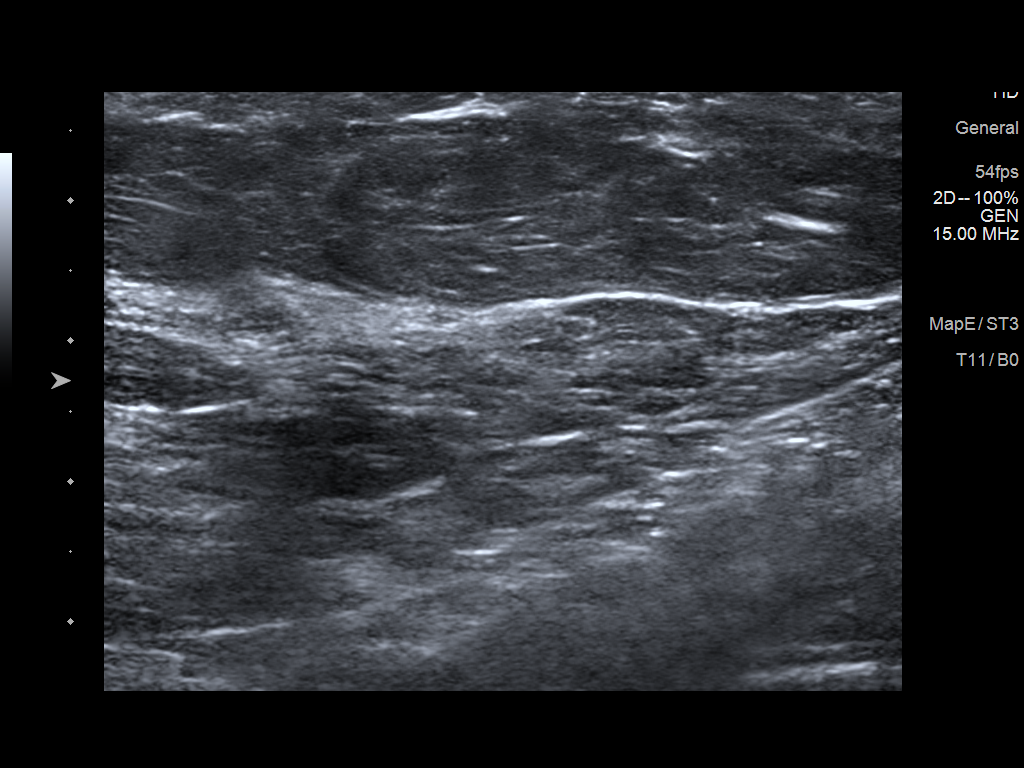

[4 of 4 positions shown; findings below may reference images not displayed]

ACR Breast Density Category b: There are scattered areas of
fibroglandular density.
FINDINGS: Full field and spot compression views of the LEFT breast demonstrate
mild persistent asymmetry within the OUTER LEFT breast.

Targeted ultrasound is performed, showing no sonographic abnormality
within the OUTER LEFT breast.
IMPRESSION: 1. Mild persistent OUTER LEFT breast asymmetry without sonographic
correlate. This most likely represents normal fibroglandular tissue
or a benign process, but 6 month follow-up is recommended to ensure
stability.

RECOMMENDATION:
LEFT diagnostic mammogram in 6 months.

I have discussed the findings and recommendations with the patient.
If applicable, a reminder letter will be sent to the patient
regarding the next appointment.

BI-RADS CATEGORY  3: Probably benign.

## 2024-05-24 ENCOUNTER — Telehealth: Payer: Self-pay | Admitting: Family

## 2024-05-24 DIAGNOSIS — E041 Nontoxic single thyroid nodule: Secondary | ICD-10-CM

## 2024-05-24 NOTE — Telephone Encounter (Signed)
 Please advise pt that she is due for a follow up thyroid  ultrasound. Order has been placed.

## 2024-05-25 ENCOUNTER — Ambulatory Visit: Admitting: Physician Assistant

## 2024-05-25 ENCOUNTER — Other Ambulatory Visit: Payer: Self-pay

## 2024-05-25 ENCOUNTER — Encounter: Payer: Self-pay | Admitting: Physician Assistant

## 2024-05-25 DIAGNOSIS — M25562 Pain in left knee: Secondary | ICD-10-CM

## 2024-05-25 NOTE — Telephone Encounter (Signed)
 Called a few times but no answer, lvm for patient to be aware of this information

## 2024-05-25 NOTE — Progress Notes (Unsigned)
 "  Office Visit Note   Patient: Kim Pham           Date of Birth: July 23, 1980           MRN: [id number] Visit Date: 05/25/2024              Requested by: Daryl Setter, NP [address], [address] [place],  [place] [address] PCP: Daryl Setter, NP  No chief complaint on file.     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  Have seen her in the past for pain in her left knee as has Dr. Anderson.  I last saw her and injected her in May 2025.  Unfortunately she had a recent injury where she tripped going up some stairs.  Since she has had tightness and pain in her knee  Assessment & Plan: Visit Diagnoses:  1. Acute pain of left knee     Plan: She has now had this trouble with her knees for 8 months.  She had a reinjury however she has had several episodes of locking catching and popping in her knee.  I also aspirated 45 cc of joint fluid in her knee.  I think because of this I would like to go forward with an MRI based on the MRI would refer her probably to Dr. Addie  Follow-Up Instructions: Will contact her after the MRI  Ortho Exam  Patient is alert, oriented, no adenopathy, well-dressed, normal affect, normal respiratory effort. Left knee she is neurovascular intact no redness no cellulitis compartments are soft and tender she is more tender laterally than medially.  Good stability.  She does have some pain over the quad tendon but can maintain a straight leg raise no pain over the patella tendon some tenderness over the lateral and medial joint line.  She has moderate effusion but no warmth or erythema negative Homans' sign    Imaging: XR KNEE 3 VIEW LEFT Result Date: 05/25/2024 Three-view radiographs of her left knee demonstrate no acute arthropathy no fractures  No images are attached to the encounter.  Labs: Lab Results  Component Value Date   HGBA1C 5.5 02/15/2023   ESRSEDRATE 6 07/05/2017   ESRSEDRATE 4  08/23/2016   ESRSEDRATE 2 03/08/2016   CRP <1.0 10/07/2021     Lab Results  Component Value Date   ALBUMIN  4.3 01/21/2024   ALBUMIN  4.0 07/19/2023   ALBUMIN  4.7 07/08/2023    Lab Results  Component Value Date   MG 2.0 07/19/2023   MG 2.1 12/12/2022   MG 1.8 05/13/2022   Lab Results  Component Value Date   VD25OH 41 09/29/2021   VD25OH 25 (L) 07/05/2017   VD25OH 28.64 (L) 11/21/2014    No results found for: PREALBUMIN    Latest Ref Rng & Units 01/21/2024    2:59 PM 10/24/2023   10:16 AM 07/19/2023    9:20 AM  CBC EXTENDED  WBC 4.0 - 10.5 K/uL 7.2  6.9  7.4   RBC 3.87 - 5.11 MIL/uL 4.16  4.30  4.58   Hemoglobin 12.0 - 15.0 g/dL 86.8  86.5  86.3   HCT 36.0 - 46.0 % 39.0  39.1  41.1   Platelets 150 - 400 K/uL 278  299  322   NEUT# 1.7 - 7.7 K/uL 4.8   5.2   Lymph# 0.7 - 4.0 K/uL 2.0   1.5      There is no height or weight on  file to calculate BMI.  Orders:  Orders Placed This Encounter  Procedures   XR KNEE 3 VIEW LEFT   No orders of the defined types were placed in this encounter.    Procedures: Large Joint Inj: L knee on 05/25/2024 4:12 PM Indications: pain and diagnostic evaluation Details: 25 G 1.5 in needle, superolateral approach  Arthrogram: No  Medications: 40 mg methylPREDNISolone  acetate 40 MG/ML; 5 mL lidocaine  1 % Aspirate: 45 mL yellow Outcome: tolerated well, no immediate complications  After obtaining verbal consent superior lateral pouch of the left knee was prepped with alcohol and Betadine .  4 cc of lidocaine  plain was injected.  After adequate analgesia the area was reprepped.  18-gauge needle was placed and 45 cc of yellow clear fluid was aspirated.  She was then injected with methylprednisolone .  Band-Aid was applied Procedure, treatment alternatives, risks and benefits explained, specific risks discussed. Consent was given by the patient. Immediately prior to procedure a time out was called to verify the correct patient, procedure,  equipment, support staff and site/side marked as required. Patient was prepped and draped in the usual sterile fashion.     Clinical Data: No additional findings.  ROS:  All other systems negative, except as noted in the HPI. Review of Systems  Objective: Vital Signs: There were no vitals taken for this visit.  Specialty Comments:  EXAM: MRI LUMBAR SPINE WITHOUT CONTRAST   TECHNIQUE: Multiplanar, multisequence MR imaging of the lumbar spine was performed. No intravenous contrast was administered.   COMPARISON:  Radiograph from 08/09/2022.   FINDINGS: Segmentation: Standard. Lowest well-formed disc space labeled the L5-S1 level.   Alignment: Trace levoscoliosis. Alignment otherwise normal with preservation of the normal lumbar lordosis. No significant listhesis.   Vertebrae: Vertebral body height maintained without acute or chronic fracture. Bone marrow signal intensity within normal limits. No discrete or worrisome osseous lesions. No abnormal marrow edema.   Conus medullaris and cauda equina: Conus extends to the L2-3 level. Conus and cauda equina appear normal.   Paraspinal and other soft tissues: Unremarkable.   Disc levels:   L1-2: Mild disc bulge. Mild bilateral facet hypertrophy. No spinal stenosis. Foramina remain patent.   L2-3: Mild annular disc bulge. Mild right greater than left facet hypertrophy. No spinal stenosis. Foramina remain patent.   L3-4: Mild diffuse disc bulge. Superimposed left foraminal to extraforaminal disc protrusion contacts the exiting left L3 nerve root. Mild facet and ligament flavum hypertrophy. Resultant mild spinal stenosis. Foramina remain adequately patent.   L4-5: Disc desiccation with mild disc bulge. Right foraminal to extraforaminal disc protrusion with annular fissure contacts the exiting right L4 nerve root (series 8, image 30). Mild to moderate bilateral facet hypertrophy. Resultant mild narrowing of the  right lateral recess. Central canal remains patent. Mild bilateral L4 foraminal stenosis.   L5-S1: Disc desiccation with minimal annular disc bulge. No canal or lateral recess stenosis. Foramina remain patent.   IMPRESSION: 1. Left foraminal to extraforaminal disc protrusion at L3-4, contacting the exiting left L3 nerve root. 2. Right foraminal to extraforaminal disc protrusion at L4-5, contacting the exiting right L4 nerve root. 3. Lower lumbar facet hypertrophy, most pronounced at L3-4 and L4-5. Finding could contribute to lower back pain.     Electronically Signed   By: Morene Hoard M.D.   On: 08/10/2022 21:39  PMFS History: Patient Active Problem List   Diagnosis Date Noted   Acute bronchitis 03/20/2024   Iron deficiency anemia 07/14/2023   URI with cough and  congestion 04/01/2023   Iron deficiency 03/23/2023   B12 deficiency 02/16/2023   Grief reaction 01/21/2023   Recurrent herniation of lumbar disc 12/24/2022   Lumbar disc herniation 11/08/2022   Pre-op evaluation 10/22/2022   Acute pancreatitis 09/14/2022   Nausea 09/09/2022   Abdominal pain 09/09/2022   Migraines 07/27/2022   Alcohol abuse 06/25/2022   Status post laparoscopic cholecystectomy 06/08/2022   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 05/27/2022   On continuous oral anticoagulation 05/17/2022   Pulmonary embolism (HCC) 05/11/2022   RUQ pain 05/07/2022   Voice hoarseness 05/07/2022   Thyroid  nodule 05/04/2022   Former smoker 04/20/2022   Subclinical hypothyroidism 12/18/2021   Effusion, left knee 12/16/2021   Endometriosis 12/16/2021   Cardiac murmur 12/16/2021   Obesity (BMI 30.0-34.9) 12/16/2021   Coronary artery calcification seen on CT scan 12/16/2021   Palpitations 12/15/2021   Preventative health care 09/08/2021   Hyperlipidemia 09/08/2021   Amenorrhea 08/14/2021   Anxiety 08/14/2021   Chronic constipation 08/14/2021   Chronic headaches  HISTORY OF MIGRAINES 08/14/2021   Complication of anesthesia 08/14/2021   HISTORY OF MIGRAINES 08/14/2021   OSTEOARTHRITIS 08/14/2021   Wears glasses for reading 08/14/2021   Chondromalacia, patella 06/30/2021   Family history of BRCA gene mutation 06/17/2021   Cyst of left ovary 06/17/2021   Vasomotor symptoms due to menopause 06/17/2021   Rectal bleeding 06/11/2021   Hot flashes 06/11/2021   Pain in left knee 11/04/2020   Insomnia 08/19/2020   Gastroesophageal reflux disease 08/19/2020   Sacroiliac joint dysfunction of right side 12/19/2019   Degenerative disc disease, lumbar 09/04/2019   Diarrhea 07/08/2017   Psychogenic nonepileptic seizure 12/22/2016   Sicca syndrome 11/22/2016   Raynaud's disease without gangrene 11/22/2016   History of Chiari malformation 11/22/2016   High risk medication use 11/22/2016   Other epilepsy, intractable, without status epilepticus (HCC) 11/01/2016   Arthralgia 08/18/2016   SLE (systemic lupus erythematosus) (HCC) 03/26/2016   Depression with anxiety 03/26/2016   Transient alteration of awareness 01/01/2016   Chiari malformation 12/19/2015   Family history of breast cancer 09/09/2015   Past Medical History:  Diagnosis Date   Acute pulmonary embolism without acute cor pulmonale (HCC) 05/17/2022   ADHD (attention deficit hyperactivity disorder)    Adnexal mass 02/24/2021   in epic seen on us    Amenorrhea 08/14/2021   Anxiety 08/14/2021   Arthralgia 08/18/2016   Back pain 09/04/2021   Blood in stool 09/08/2021   Cardiac murmur 12/16/2021   Chest discomfort 04/20/2022   Cholecystitis 05/26/2022   Chondromalacia, patella 06/30/2021   Chronic calculous cholecystitis 06/08/2022   Chronic constipation 08/14/2021   Chronic headaches HISTORY OF MIGRAINES 08/14/2021   Chronic migraine without aura, with intractable migraine, so stated, with status migrainosus 07/27/2017   Complication of anesthesia  08/14/2021   PONV   Constipation 06/11/2021   Coronary artery calcification seen on CT scan 12/16/2021   Cyst of left ovary 06/17/2021   Degenerative disc disease, lumbar 09/04/2019   Depression with anxiety 03/26/2016   Diarrhea 07/08/2017   DOE (dyspnea on exertion) 04/20/2022   Effusion, left knee 12/16/2021   Endometriosis    Family history of BRCA gene mutation 06/17/2021   [facility] genetic testing done 06/2021 - negative for breast cancer genes.   Family history of breast cancer 09/09/2015   Formatting of this note might be different from the original.  Mom dx'd age 42  Mat aunt dx'd age 14  Neither relative tested for BRCA  Formatting of  this note might be different from the original.  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    High risk medication use 11/22/2016   history of Chiari malformation    History of Chiari malformation 11/22/2016   history of COVID 02/2021   LOW GRADE FEVER CONGESTION AND HEADACHE ALL SYMPTOMS RESOLVED   Hot flashes 06/11/2021   Hyperlipidemia    Hypertension    Insomnia 08/19/2020   Low blood pressure    pt states history of low blood pressure-fainted 12/2010-instructed to move slowly   Nipple discharge 05/19/2022   Nonepileptic episode (HCC) 12/22/2016   Numbness and tingling 02/25/2016   Obesity (BMI 30.0-34.9) 12/16/2021   On continuous oral anticoagulation 05/17/2022   OSTEOARTHRITIS 08/14/2021   Other epilepsy, intractable, without status epilepticus (HCC) 11/01/2016   Pain in right knee 11/13/2020   Pain in unspecified joint 08/18/2016   Palpitations 12/15/2021   Pelvic pain in female 08/18/2021   Photosensitivity 11/22/2016   PONV (postoperative nausea and vomiting)    Preventative health care 09/08/2021    Psychogenic nonepileptic seizure 07/27/2017   Pulmonary embolism (HCC)    Raynaud's disease without gangrene 11/22/2016   Rectal bleeding 06/11/2021   RUQ pain 05/07/2022   Sacroiliac joint dysfunction of right side 12/19/2019   Sicca syndrome 11/22/2016   SLE (systemic lupus erythematosus) (HCC) 03/26/2016   Status post laparoscopic cholecystectomy 06/08/2022   Subclinical hypothyroidism    Thyroid  nodule    Transient alteration of awareness 01/01/2016   Vasomotor symptoms due to menopause 06/17/2021   Voice hoarseness 05/07/2022   Wears glasses for reading 08/14/2021    Family History  Problem Relation Age of Onset   Hypertension Mother    CAD Mother    Breast cancer Mother    Migraines Mother    Gout Mother    Heart disease Mother    Crohn's disease Mother    Hypertension Father    CAD Father    CVA Father    Heart disease Father    Colon polyps Father    Liver disease Father    Sjogren's syndrome Sister    Celiac disease Sister    Rheum arthritis Maternal Grandmother    Dementia Maternal Grandmother    Pancreatic cancer Maternal Grandfather    CAD Paternal Grandmother    Macular degeneration Paternal Grandmother    CVA Paternal Grandfather    Autism Son    ADD / ADHD Son    Breast cancer Maternal Aunt    Esophageal cancer Neg Hx    Colon cancer Neg Hx    Stomach cancer Neg Hx     Past Surgical History:  Procedure Laterality Date   ABDOMINAL HYSTERECTOMY  2007   CHOLECYSTECTOMY N/A 06/08/2022   Procedure: LAPAROSCOPIC CHOLECYSTECTOMY WITH ICG;  Surgeon: Aron Shoulders, MD;  Location: [facility];  Service: General;  Laterality: N/A;   CYSTOSCOPY  2007   WITH HYSTERECTOMY   DIAGNOSTIC LAPAROSCOPY  2005   LUMBAR LAMINECTOMY/DECOMPRESSION MICRODISCECTOMY Left 11/08/2022   Procedure: L4-5 FAR LEFT LATERAL DISCECTOMY;  Surgeon: Georgina Ozell LABOR, MD;  Location: [facility];  Service: Orthopedics;  Laterality: Left;    OOPHORECTOMY Left 08/18/2021   ROTATOR CUFF REPAIR Right 2010   SALPINGOOPHORECTOMY  04/16/2011   Procedure: SALPINGO OOPHERECTOMY;  Surgeon: Charlie JINNY Flowers,  MD;  Location: [facility];  Service: Gynecology;  Laterality: Right;   skull decompression surgery 2005     @ [facility]   Social History   Occupational History   Occupation: nurse  Tobacco Use   Smoking status: Some Days    Current packs/day: 1.00    Average packs/day: 1 pack/day for 30.1 years (30.1 ttl pk-yrs)    Types: Cigarettes    Start date: 04/04/1994    Passive exposure: Past   Smokeless tobacco: Never  Vaping Use   Vaping status: Never Used  Substance and Sexual Activity   Alcohol use: Yes    Comment: ocassional   Drug use: Never   Sexual activity: Yes    Birth control/protection: Surgical       "

## 2024-05-31 ENCOUNTER — Ambulatory Visit (HOSPITAL_BASED_OUTPATIENT_CLINIC_OR_DEPARTMENT_OTHER)

## 2024-06-12 ENCOUNTER — Other Ambulatory Visit
